# Patient Record
Sex: Male | Born: 1935 | Race: Black or African American | Hispanic: No | Marital: Married
Health system: Southern US, Community
[De-identification: ages and names within clinical notes are randomized; demographics above are authoritative.]

## PROBLEM LIST (undated history)

## (undated) DIAGNOSIS — I10 Essential (primary) hypertension: Secondary | ICD-10-CM

## (undated) DIAGNOSIS — R569 Unspecified convulsions: Secondary | ICD-10-CM

## (undated) DIAGNOSIS — E785 Hyperlipidemia, unspecified: Secondary | ICD-10-CM

## (undated) DIAGNOSIS — C7951 Secondary malignant neoplasm of bone: Secondary | ICD-10-CM

## (undated) DIAGNOSIS — I350 Nonrheumatic aortic (valve) stenosis: Secondary | ICD-10-CM

## (undated) DIAGNOSIS — H401133 Primary open-angle glaucoma, bilateral, severe stage: Secondary | ICD-10-CM

## (undated) DIAGNOSIS — I639 Cerebral infarction, unspecified: Secondary | ICD-10-CM

## (undated) DIAGNOSIS — C61 Malignant neoplasm of prostate: Secondary | ICD-10-CM

## (undated) DIAGNOSIS — Z86711 Personal history of pulmonary embolism: Secondary | ICD-10-CM

## (undated) DIAGNOSIS — I050 Rheumatic mitral stenosis: Secondary | ICD-10-CM

## (undated) DIAGNOSIS — Z8546 Personal history of malignant neoplasm of prostate: Secondary | ICD-10-CM

## (undated) DIAGNOSIS — H269 Unspecified cataract: Secondary | ICD-10-CM

## (undated) DIAGNOSIS — M1990 Unspecified osteoarthritis, unspecified site: Secondary | ICD-10-CM

## (undated) DIAGNOSIS — R918 Other nonspecific abnormal finding of lung field: Secondary | ICD-10-CM

## (undated) HISTORY — DX: Unspecified cataract: H26.9

## (undated) HISTORY — DX: Hyperlipidemia, unspecified: E78.5

## (undated) HISTORY — PX: LIPOMA EXCISION: SHX5283

## (undated) HISTORY — DX: Unspecified osteoarthritis, unspecified site: M19.90

## (undated) HISTORY — DX: Essential (primary) hypertension: I10

## (undated) HISTORY — PX: PROSTATECTOMY: SHX69

---

## 2011-02-19 ENCOUNTER — Ambulatory Visit: Payer: Self-pay | Admitting: Gastroenterology

## 2011-02-19 LAB — HM COLONOSCOPY

## 2011-07-18 ENCOUNTER — Ambulatory Visit: Payer: Self-pay | Admitting: Family Medicine

## 2012-04-02 ENCOUNTER — Ambulatory Visit: Payer: Self-pay | Admitting: Urology

## 2012-09-30 ENCOUNTER — Telehealth: Payer: Self-pay

## 2012-09-30 NOTE — Telephone Encounter (Signed)
Alcario Drought with [facility] has been given the recommendation by Dr Christella Hartigan.  They will have the CT scheduled and send the result to our office

## 2012-09-30 NOTE — Telephone Encounter (Signed)
She at least needs a CT scan of abd/pelvis with IV and PO contrast.  If they order it, great, if not then I would like to see her in the office for NGI before ordering any imaging.

## 2012-09-30 NOTE — Telephone Encounter (Signed)
Dr Christella Hartigan this is the pt referred over for EUS, you asked for a CT or Colon (flex)  I called and the nurse states that neither of these have been done.  Do you want this done prior?  I have put the records on your desk.

## 2015-04-04 DIAGNOSIS — E780 Pure hypercholesterolemia: Secondary | ICD-10-CM | POA: Diagnosis not present

## 2015-04-04 DIAGNOSIS — C61 Malignant neoplasm of prostate: Secondary | ICD-10-CM | POA: Diagnosis not present

## 2015-04-04 DIAGNOSIS — I10 Essential (primary) hypertension: Secondary | ICD-10-CM | POA: Diagnosis not present

## 2015-04-04 LAB — HEPATIC FUNCTION PANEL
ALT: 19 U/L (ref 10–40)
AST: 23 U/L (ref 14–40)
Alkaline Phosphatase: 44 U/L (ref 25–125)

## 2015-04-04 LAB — CBC AND DIFFERENTIAL
HEMATOCRIT: 40 % — AB (ref 41–53)
Hemoglobin: 13.5 g/dL (ref 13.5–17.5)
NEUTROS ABS: 3 /uL
PLATELETS: 186 10*3/uL (ref 150–399)
WBC: 6.5 10^3/mL

## 2015-04-04 LAB — LIPID PANEL
Cholesterol: 199 mg/dL (ref 0–200)
HDL: 33 mg/dL — AB (ref 35–70)
LDL CALC: 134 mg/dL
LDL/HDL RATIO: 4.1
Triglycerides: 159 mg/dL (ref 40–160)

## 2015-04-04 LAB — BASIC METABOLIC PANEL
BUN: 20 mg/dL (ref 4–21)
Creatinine: 1.2 mg/dL (ref 0.6–1.3)
GLUCOSE: 102 mg/dL
Potassium: 4.8 mmol/L (ref 3.4–5.3)
SODIUM: 140 mmol/L (ref 137–147)

## 2015-04-04 LAB — TSH: TSH: 4.76 u[IU]/mL (ref 0.41–5.90)

## 2015-04-04 LAB — HEMOGLOBIN A1C: Hgb A1c MFr Bld: 6.1 % — AB (ref 4.0–6.0)

## 2015-07-06 ENCOUNTER — Telehealth: Payer: Self-pay | Admitting: Family Medicine

## 2015-07-06 NOTE — Telephone Encounter (Signed)
Pt wife returning call.  PR#[id number]

## 2015-07-06 NOTE — Telephone Encounter (Signed)
See other message in the wife's chart-aa

## 2015-09-27 DIAGNOSIS — Z87891 Personal history of nicotine dependence: Secondary | ICD-10-CM | POA: Insufficient documentation

## 2015-09-27 DIAGNOSIS — E7800 Pure hypercholesterolemia, unspecified: Secondary | ICD-10-CM | POA: Insufficient documentation

## 2015-09-27 DIAGNOSIS — E669 Obesity, unspecified: Secondary | ICD-10-CM | POA: Insufficient documentation

## 2015-09-27 DIAGNOSIS — R739 Hyperglycemia, unspecified: Secondary | ICD-10-CM | POA: Insufficient documentation

## 2015-09-27 DIAGNOSIS — I10 Essential (primary) hypertension: Secondary | ICD-10-CM | POA: Insufficient documentation

## 2015-09-28 ENCOUNTER — Encounter: Payer: Self-pay | Admitting: Family Medicine

## 2015-09-28 ENCOUNTER — Ambulatory Visit (INDEPENDENT_AMBULATORY_CARE_PROVIDER_SITE_OTHER): Payer: Medicare Other | Admitting: Family Medicine

## 2015-09-28 VITALS — BP 168/84 | HR 88 | Temp 97.7°F | Resp 14 | Ht 68.5 in | Wt 229.0 lb

## 2015-09-28 DIAGNOSIS — R739 Hyperglycemia, unspecified: Secondary | ICD-10-CM

## 2015-09-28 DIAGNOSIS — I10 Essential (primary) hypertension: Secondary | ICD-10-CM

## 2015-09-28 DIAGNOSIS — E7800 Pure hypercholesterolemia, unspecified: Secondary | ICD-10-CM | POA: Diagnosis not present

## 2015-09-28 LAB — POCT GLYCOSYLATED HEMOGLOBIN (HGB A1C): HEMOGLOBIN A1C: 6.2

## 2015-09-28 NOTE — Progress Notes (Signed)
Patient ID: Travis Salazar., male   DOB: Aug 22, 1936, 79 y.o.   MRN: [id number]       Patient: Travis Salazar. Male    DOB: September 14, 1936   79 y.o.   MRN: [id number] Visit Date: 09/28/2015  Today's Provider: [name], MD   Chief Complaint  Patient presents with  . Hyperlipidemia  . Hypertension  . Hyperglycemia   Subjective:    Hyperlipidemia This is a chronic problem. The problem is controlled. Pertinent negatives include no chest pain, focal sensory loss, focal weakness, leg pain, myalgias or shortness of breath. Current antihyperlipidemic treatment includes statins. The current treatment provides moderate improvement of lipids. There are no compliance problems.   Hypertension This is a chronic problem. The problem is unchanged. The problem is uncontrolled. Associated symptoms include headaches. Pertinent negatives include no blurred vision, chest pain, palpitations, peripheral edema or shortness of breath. There are no compliance problems.   Hyperglycemia This is a chronic problem. The problem has been unchanged. Associated symptoms include headaches. Pertinent negatives include no chest pain or myalgias.  Last HgbA1c was 6.1 on 04/04/2015.      No Known Allergies Previous Medications   ASPIRIN 325 MG TABLET    Take by mouth.   ATORVASTATIN (LIPITOR) 10 MG TABLET    Take by mouth.   HYDROCHLOROTHIAZIDE (HYDRODIURIL) 25 MG TABLET    Take by mouth.   LISINOPRIL (PRINIVIL,ZESTRIL) 40 MG TABLET    Take by mouth.    Review of Systems  Constitutional: Negative.   Eyes: Negative.  Negative for blurred vision.  Respiratory: Negative.  Negative for shortness of breath.   Cardiovascular: Negative.  Negative for chest pain and palpitations.  Endocrine: Negative.   Musculoskeletal: Negative.  Negative for myalgias.  Neurological: Positive for headaches. Negative for focal weakness.  Psychiatric/Behavioral: Negative.     Social History  Substance Use Topics  . Smoking  status: Never Smoker   . Smokeless tobacco: Not on file  . Alcohol Use: No   Objective:   BP 168/84 mmHg  Pulse 88  Temp(Src) 97.7 F (36.5 C)  Resp 14  Ht 5' 8.5" (1.74 m)  Wt 229 lb (103.874 kg)  BMI 34.31 kg/m2  SpO2 100%  Physical Exam  Constitutional: He is oriented to person, place, and time. He appears well-developed and well-nourished.  HENT:  Head: Normocephalic and atraumatic.  Right Ear: External ear normal.  Left Ear: External ear normal.  Nose: Nose normal.  Eyes: Conjunctivae are normal.  Neck: Neck supple.  Cardiovascular: Normal rate, regular rhythm and normal heart sounds.   Pulmonary/Chest: Effort normal and breath sounds normal.  Abdominal: Soft.  Neurological: He is alert and oriented to person, place, and time.  Skin: Skin is warm and dry.  Psychiatric: He has a normal mood and affect. His behavior is normal. Judgment and thought content normal.        Assessment & Plan:     1. Blood glucose elevated  - POCT HgB A1C--6.2 today--good control. Asian continued to work on diet and exercise habits ongoing  2. Hypercholesterolemia   3. Essential (primary) hypertension        [name], MD  [facility]

## 2015-10-02 ENCOUNTER — Ambulatory Visit: Payer: Self-pay | Admitting: Family Medicine

## 2016-01-04 ENCOUNTER — Encounter: Payer: Self-pay | Admitting: Emergency Medicine

## 2016-03-28 ENCOUNTER — Ambulatory Visit: Payer: Medicare Other | Admitting: Family Medicine

## 2016-04-04 ENCOUNTER — Encounter: Payer: Self-pay | Admitting: Family Medicine

## 2016-04-04 ENCOUNTER — Ambulatory Visit (INDEPENDENT_AMBULATORY_CARE_PROVIDER_SITE_OTHER): Payer: Medicare Other | Admitting: Family Medicine

## 2016-04-04 VITALS — BP 116/80 | HR 80 | Temp 98.3°F | Resp 16 | Wt 228.0 lb

## 2016-04-04 DIAGNOSIS — I10 Essential (primary) hypertension: Secondary | ICD-10-CM

## 2016-04-04 DIAGNOSIS — E7800 Pure hypercholesterolemia, unspecified: Secondary | ICD-10-CM

## 2016-04-04 DIAGNOSIS — R739 Hyperglycemia, unspecified: Secondary | ICD-10-CM | POA: Diagnosis not present

## 2016-04-04 DIAGNOSIS — E669 Obesity, unspecified: Secondary | ICD-10-CM | POA: Diagnosis not present

## 2016-04-04 NOTE — Progress Notes (Signed)
Patient ID: Travis Salazar., male   DOB: 05-30-1936, 80 y.o.   MRN: [id number]    Subjective:  HPI  Patient is here for 6 months follow up.  Hypertension: Patient checks his b/p occasionally and readings are around 120s-80s. No cardiac symptoms. BP Readings from Last 3 Encounters:  04/04/16 116/80  09/28/15 168/84  04/04/15 142/90   Hyperlipidemia: Patient takes Lipitor daily. No side effects so far. Lab Results  Component Value Date   CHOL 199 04/04/2015   HDL 33* 04/04/2015   LDLCALC 134 04/04/2015   TRIG 159 04/04/2015    Hyperglycemia: patient is not on any medications for this. He walks about 2 to 3 times a week for about 20 minutes or so. Patient does not check his sugars. Lab Results  Component Value Date   HGBA1C 6.2 09/28/2015     Prior to Admission medications   Medication Sig Start Date End Date Taking? Authorizing Provider  aspirin 325 MG tablet Take by mouth. 12/03/11  Yes Historical Provider, MD  atorvastatin (LIPITOR) 10 MG tablet Take by mouth. 05/02/15  Yes Historical Provider, MD  hydrochlorothiazide (HYDRODIURIL) 25 MG tablet Take by mouth. 02/14/15  Yes Historical Provider, MD  lisinopril (PRINIVIL,ZESTRIL) 40 MG tablet Take by mouth. 05/02/15  Yes Historical Provider, MD    Patient Active Problem List   Diagnosis Date Noted  . Essential (primary) hypertension 09/27/2015  . History of tobacco use 09/27/2015  . Hypercholesterolemia 09/27/2015  . Blood glucose elevated 09/27/2015  . Adiposity 09/27/2015    No past medical history on file.  Social History   Social History  . Marital Status: Married    Spouse Name: N/A  . Number of Children: N/A  . Years of Education: N/A   Occupational History  . Not on file.   Social History Main Topics  . Smoking status: Never Smoker   . Smokeless tobacco: Never Used  . Alcohol Use: No  . Drug Use: No  . Sexual Activity: Not on file   Other Topics Concern  . Not on file   Social History Narrative     No Known Allergies  Review of Systems  Constitutional: Negative.   Respiratory: Negative.   Cardiovascular: Negative.   Gastrointestinal: Negative.   Musculoskeletal: Negative.   Psychiatric/Behavioral: Negative.     Immunization History  Administered Date(s) Administered  . Pneumococcal Conjugate-13 10/04/2014  . Zoster 11/13/2012   Objective:  BP 116/80 mmHg  Pulse 80  Temp(Src) 98.3 F (36.8 C)  Resp 16  Wt 228 lb (103.42 kg)  Physical Exam  Constitutional: He is oriented to person, place, and time and well-developed, well-nourished, and in no distress.  HENT:  Head: Normocephalic and atraumatic.  Eyes: Conjunctivae are normal. Pupils are equal, round, and reactive to light.  Neck: Normal range of motion. Neck supple.  Cardiovascular: Normal rate, regular rhythm, normal heart sounds and intact distal pulses.   No murmur heard. Pulmonary/Chest: Effort normal and breath sounds normal. No respiratory distress. He has no wheezes.  Abdominal: There is no tenderness.  Musculoskeletal: Normal range of motion. He exhibits no edema or tenderness.  Neurological: He is alert and oriented to person, place, and time.  Psychiatric: Mood, memory, affect and judgment normal.    Lab Results  Component Value Date   WBC 6.5 04/04/2015   HGB 13.5 04/04/2015   HCT 40* 04/04/2015   PLT 186 04/04/2015   CHOL 199 04/04/2015   TRIG 159 04/04/2015   HDL 33* 04/04/2015  LDLCALC 134 04/04/2015   TSH 4.76 04/04/2015   HGBA1C 6.2 09/28/2015    CMP     Component Value Date/Time   NA 140 04/04/2015   K 4.8 04/04/2015   BUN 20 04/04/2015   CREATININE 1.2 04/04/2015   AST 23 04/04/2015   ALT 19 04/04/2015   ALKPHOS 44 04/04/2015    Assessment and Plan :  1. Essential (primary) hypertension Stable. Continue current medications. - CBC with Differential/Platelet - Comprehensive metabolic panel  2. Hypercholesterolemia Check levels, pending results. - Comprehensive  metabolic panel - Lipid Panel With LDL/HDL Ratio  3. Blood glucose elevated/prediabetes Pending results. - HgB A1c  4. Obesity Continue working on habits. - TSH  Patient was seen and examined by Dr. Eulas Salazar and note was scribed by Travis Salazar, [place].  Travis Aschoff MD [facility] 04/04/2016 11:32 AM

## 2016-04-26 ENCOUNTER — Other Ambulatory Visit: Payer: Self-pay | Admitting: Family Medicine

## 2016-06-03 ENCOUNTER — Other Ambulatory Visit: Payer: Self-pay | Admitting: Family Medicine

## 2016-10-08 ENCOUNTER — Encounter: Payer: Self-pay | Admitting: Family Medicine

## 2016-10-08 ENCOUNTER — Ambulatory Visit (INDEPENDENT_AMBULATORY_CARE_PROVIDER_SITE_OTHER): Payer: Medicare Other | Admitting: Family Medicine

## 2016-10-08 VITALS — BP 138/86 | HR 84 | Temp 98.3°F | Resp 16 | Ht 69.0 in | Wt 220.0 lb

## 2016-10-08 DIAGNOSIS — R0683 Snoring: Secondary | ICD-10-CM | POA: Diagnosis not present

## 2016-10-08 DIAGNOSIS — Z9079 Acquired absence of other genital organ(s): Secondary | ICD-10-CM | POA: Insufficient documentation

## 2016-10-08 DIAGNOSIS — Z0000 Encounter for general adult medical examination without abnormal findings: Secondary | ICD-10-CM

## 2016-10-08 DIAGNOSIS — R739 Hyperglycemia, unspecified: Secondary | ICD-10-CM | POA: Diagnosis not present

## 2016-10-08 DIAGNOSIS — E7800 Pure hypercholesterolemia, unspecified: Secondary | ICD-10-CM

## 2016-10-08 NOTE — Progress Notes (Signed)
Patient: Travis Marczak., Male    DOB: 31-May-1936, 80 y.o.   MRN: [id number] Visit Date: 10/08/2016  Today's Provider: [name], MD   Chief Complaint  Patient presents with  . Medicare Wellness   Subjective:    Annual wellness visit Travis Huenefeld. is a 80 y.o. male. He feels well. He reports exercising 3 days per week. Walks for 2 miles. He reports he is sleeping well.  Last colonoscopy- 02/19/2011- medium sized lipoma in transverse colon. Otherwise WNL. Dr. Candace Cruise. Patient feels well. He lives at home with his wife. His phrenic son lives with him. His review of systems is negative except for snoring and mild joint stiffness. -----------------------------------------------------------   Review of Systems  Constitutional: Negative.   HENT: Negative.   Eyes: Negative.   Respiratory: Negative.   Cardiovascular: Negative.   Gastrointestinal: Negative.   Endocrine: Negative.   Genitourinary: Negative.   Musculoskeletal: Negative.   Skin: Negative.   Allergic/Immunologic: Negative.   Neurological: Negative.   Hematological: Negative.   Psychiatric/Behavioral: Negative.     Social History   Social History  . Marital status: Married    Spouse name: Travis Salazar  . Number of children: 4  . Years of education: 10th grade   Occupational History  . retired    Social History Main Topics  . Smoking status: Former Smoker    Packs/day: 1.00    Years: 50.00    Quit date: 12/15/2009  . Smokeless tobacco: Never Used  . Alcohol use No  . Drug use: No  . Sexual activity: Not on file   Other Topics Concern  . Not on file   Social History Narrative  . No narrative on file    History reviewed. No pertinent past medical history.   Patient Active Problem List   Diagnosis Date Noted  . S/P prostatectomy 10/08/2016  . Snores 10/08/2016  . Essential (primary) hypertension 09/27/2015  . History of tobacco use 09/27/2015  . Hypercholesterolemia 09/27/2015  .  Blood glucose elevated 09/27/2015  . Adiposity 09/27/2015    Past Surgical History:  Procedure Laterality Date  . LIPOMA EXCISION     neck  . PROSTATECTOMY     due to prostate cancer    His family history includes COPD in his mother; Heart attack in his father.    Current Meds  Medication Sig  . aspirin 325 MG tablet Take by mouth.  Marland Kitchen atorvastatin (LIPITOR) 10 MG tablet Take by mouth.  . hydrochlorothiazide (HYDRODIURIL) 25 MG tablet TAKE 1 TABLET BY MOUTH DAILY  . lisinopril (PRINIVIL,ZESTRIL) 40 MG tablet TAKE 1 TABLET BY MOUTH DAILY    Patient Care Team: [name], MD as PCP - General (Family Medicine)    Objective:   Vitals: BP 138/86 (BP Location: Left Arm, Patient Position: Sitting, Cuff Size: Large)   Pulse 84   Temp 98.3 F (36.8 C) (Oral)   Resp 16   Ht 5\' 9"  (1.753 m)   Wt 220 lb (99.8 kg)   BMI 32.49 kg/m   Physical Exam  Constitutional: He is oriented to person, place, and time. He appears well-developed and well-nourished.  HENT:  Head: Normocephalic and atraumatic.  Right Ear: Tympanic membrane and external ear normal.  Left Ear: Tympanic membrane and external ear normal.  Nose: Nose normal.  Mouth/Throat: Oropharynx is clear and moist.  Eyes: Conjunctivae are normal. Pupils are equal, round, and reactive to light. Right eye exhibits no discharge. Left  eye exhibits no discharge.  Neck: Normal range of motion. Neck supple. No tracheal deviation present. No thyromegaly present.  Cardiovascular: Normal rate and regular rhythm.   Murmur (soft) heard.  Systolic murmur is present with a grade of 2/6  Pulmonary/Chest: Effort normal and breath sounds normal. No respiratory distress.  Abdominal: Soft. Bowel sounds are normal. He exhibits no distension.  Musculoskeletal: Normal range of motion. He exhibits no edema.  Lymphadenopathy:    He has no cervical adenopathy.  Neurological: He is alert and oriented to person, place, and time. He has  normal reflexes.  Skin: Skin is warm and dry.  Psychiatric: He has a normal mood and affect. His behavior is normal.    Activities of Daily Living In your present state of health, do you have any difficulty performing the following activities: 10/08/2016 04/04/2016  Hearing? N N  Vision? N N  Difficulty concentrating or making decisions? N N  Walking or climbing stairs? N N  Dressing or bathing? N N  Doing errands, shopping? N N  Some recent data might be hidden    Fall Risk Assessment Fall Risk  10/08/2016 04/04/2016  Falls in the past year? No No     Depression Screen PHQ 2/9 Scores 10/08/2016 04/04/2016  PHQ - 2 Score 0 0    Cognitive Testing - 6-CIT  Correct? Score   What year is it? yes 0 0 or 4  What month is it? yes 0 0 or 3  Memorize:    Travis Salazar,  44,  [place] [address],  [place],      What time is it? (within 1 hour) yes 0 0 or 3  Count backwards from 20 yes 0 0, 2, or 4  Name the months of the year no 4 0, 2, or 4  Repeat name & address above no 6 0, 2, 4, 6, 8, or 10       TOTAL SCORE  10/28   Interpretation:  Abnormal- 10/28  Normal (0-7) Abnormal (8-28)       Assessment & Plan:     Annual Wellness Visit  Reviewed patient's Family Medical History Reviewed and updated list of patient's medical providers Assessment of cognitive impairment was done Assessed patient's functional ability Established a written schedule for health screening [place] Completed and Reviewed  Exercise Activities and Dietary recommendations Goals    None      Immunization History  Administered Date(s) Administered  . Pneumococcal Conjugate-13 10/04/2014  . Pneumococcal Polysaccharide-23 01/11/2013  . Zoster 11/13/2012    Health Maintenance  Topic Date Due  . TETANUS/TDAP  10/16/1957  . PNA vac Low Risk Adult (2 of 2 - PPSV23) 10/05/2015  . INFLUENZA VACCINE  01/02/2017 (Originally 07/16/2016)  . ZOSTAVAX  Completed      Discussed health  benefits of physical activity, and encouraged him to engage in regular exercise appropriate for his age and condition.    ------------------------------------------------------------------------------------------------------------ 1. Medicare annual wellness visit, subsequent  2. Hypercholesterolemia Check labs. FU pending results. - CBC with Differential/Platelet - Comprehensive metabolic panel - Lipid panel - TSH  3. Blood glucose elevated Check labs. FU pending results. - Hemoglobin A1c  4. S/P prostatectomy Continue to FU with Dr. Bernardo Heater as scheduled (March 2018).  DRE per Dr. Bernardo Heater. 5. Snores No apneic episodes. Will monitor. 6.HTN Controlled  Patient seen and examined by Miguel Aschoff, MD, and note scribed by Renaldo Fiddler, CMA.   Travis Salazar Mon, MD  [facility]  [place]

## 2016-10-09 ENCOUNTER — Telehealth: Payer: Self-pay

## 2016-10-09 LAB — LIPID PANEL
CHOL/HDL RATIO: 5.3 ratio — AB (ref 0.0–5.0)
Cholesterol, Total: 179 mg/dL (ref 100–199)
HDL: 34 mg/dL — ABNORMAL LOW (ref >39)
LDL Calculated: 111 mg/dL — ABNORMAL HIGH (ref 0–99)
TRIGLYCERIDES: 168 mg/dL — AB (ref 0–149)
VLDL Cholesterol Cal: 34 mg/dL (ref 5–40)

## 2016-10-09 LAB — TSH: TSH: 3.66 u[IU]/mL (ref 0.450–4.500)

## 2016-10-09 LAB — COMPREHENSIVE METABOLIC PANEL
A/G RATIO: 1.5 (ref 1.2–2.2)
ALBUMIN: 4.3 g/dL (ref 3.5–4.8)
ALK PHOS: 46 IU/L (ref 39–117)
ALT: 19 IU/L (ref 0–44)
AST: 26 IU/L (ref 0–40)
BILIRUBIN TOTAL: 0.4 mg/dL (ref 0.0–1.2)
BUN / CREAT RATIO: 18 (ref 10–24)
BUN: 20 mg/dL (ref 8–27)
CHLORIDE: 98 mmol/L (ref 96–106)
CO2: 27 mmol/L (ref 18–29)
Calcium: 10 mg/dL (ref 8.6–10.2)
Creatinine, Ser: 1.09 mg/dL (ref 0.76–1.27)
GFR calc Af Amer: 75 mL/min/{1.73_m2} (ref >59)
GFR calc non Af Amer: 65 mL/min/{1.73_m2} (ref >59)
GLUCOSE: 104 mg/dL — AB (ref 65–99)
Globulin, Total: 2.9 g/dL (ref 1.5–4.5)
POTASSIUM: 4.7 mmol/L (ref 3.5–5.2)
Sodium: 140 mmol/L (ref 134–144)
Total Protein: 7.2 g/dL (ref 6.0–8.5)

## 2016-10-09 LAB — CBC WITH DIFFERENTIAL/PLATELET
BASOS ABS: 0 10*3/uL (ref 0.0–0.2)
Basos: 0 %
EOS (ABSOLUTE): 0.3 10*3/uL (ref 0.0–0.4)
Eos: 4 %
HEMOGLOBIN: 13.5 g/dL (ref 12.6–17.7)
Hematocrit: 40.6 % (ref 37.5–51.0)
Immature Grans (Abs): 0 10*3/uL (ref 0.0–0.1)
Immature Granulocytes: 0 %
LYMPHS ABS: 2.5 10*3/uL (ref 0.7–3.1)
Lymphs: 35 %
MCH: 29.5 pg (ref 26.6–33.0)
MCHC: 33.3 g/dL (ref 31.5–35.7)
MCV: 89 fL (ref 79–97)
Monocytes Absolute: 0.7 10*3/uL (ref 0.1–0.9)
Monocytes: 10 %
NEUTROS ABS: 3.6 10*3/uL (ref 1.4–7.0)
Neutrophils: 51 %
PLATELETS: 227 10*3/uL (ref 150–379)
RBC: 4.57 x10E6/uL (ref 4.14–5.80)
RDW: 14.2 % (ref 12.3–15.4)
WBC: 7.1 10*3/uL (ref 3.4–10.8)

## 2016-10-09 LAB — HEMOGLOBIN A1C
Est. average glucose Bld gHb Est-mCnc: 128 mg/dL
HEMOGLOBIN A1C: 6.1 % — AB (ref 4.8–5.6)

## 2016-10-09 NOTE — Telephone Encounter (Signed)
Pt advised.   Thanks,   -Cary Lothrop  

## 2016-10-09 NOTE — Telephone Encounter (Signed)
-----   Message from [name], MD sent at 10/09/2016  9:05 AM EDT ----- Labs okay.

## 2016-10-29 ENCOUNTER — Other Ambulatory Visit: Payer: Self-pay | Admitting: Family Medicine

## 2016-10-30 MED ORDER — ATORVASTATIN CALCIUM 10 MG PO TABS: 10.0000 mg | ORAL_TABLET | Freq: Every day | ORAL | 3 refills | Status: DC

## 2016-10-30 NOTE — Addendum Note (Signed)
Addended by: Arnette Norris on: 10/30/2016 08:30 AM   Modules accepted: Orders

## 2017-01-17 ENCOUNTER — Telehealth: Payer: Self-pay | Admitting: Family Medicine

## 2017-01-17 NOTE — Telephone Encounter (Signed)
Called Pt to schedule AWV with [facility] - knb °

## 2017-03-31 ENCOUNTER — Other Ambulatory Visit: Payer: Self-pay | Admitting: Family Medicine

## 2017-04-07 ENCOUNTER — Encounter: Payer: Self-pay | Admitting: Family Medicine

## 2017-04-07 ENCOUNTER — Ambulatory Visit (INDEPENDENT_AMBULATORY_CARE_PROVIDER_SITE_OTHER): Payer: Medicare Other | Admitting: Family Medicine

## 2017-04-07 VITALS — BP 148/82 | HR 84 | Temp 97.8°F | Resp 16 | Wt 225.0 lb

## 2017-04-07 DIAGNOSIS — M778 Other enthesopathies, not elsewhere classified: Secondary | ICD-10-CM

## 2017-04-07 DIAGNOSIS — I10 Essential (primary) hypertension: Secondary | ICD-10-CM | POA: Diagnosis not present

## 2017-04-07 DIAGNOSIS — R739 Hyperglycemia, unspecified: Secondary | ICD-10-CM

## 2017-04-07 LAB — POCT GLYCOSYLATED HEMOGLOBIN (HGB A1C): Hemoglobin A1C: 5.9

## 2017-04-07 NOTE — Progress Notes (Signed)
Subjective:  HPI  Hypertension, follow-up:  BP Readings from Last 3 Encounters:  04/07/17 (!) 158/88  10/08/16 138/86  04/04/16 116/80    He was last seen for hypertension 6 months ago.  BP at that visit was 138/86. Management since that visit includes none. He reports good compliance with treatment. He is not having side effects.  He is exercising. He is adherent to low salt diet.   Outside blood pressures are running 140's/70's. He is experiencing none.  Patient denies chest pain, chest pressure/discomfort, claudication, dyspnea, exertional chest pressure/discomfort, fatigue, irregular heart beat, lower extremity edema, near-syncope, orthopnea, palpitations, paroxysmal nocturnal dyspnea, syncope and tachypnea.   Cardiovascular risk factors include advanced age (older than 21 for men, 41 for women), dyslipidemia, male gender and smoking/ tobacco exposure.   Wt Readings from Last 3 Encounters:  04/07/17 225 lb (102.1 kg)  10/08/16 220 lb (99.8 kg)  04/04/16 228 lb (103.4 kg)   ------------------------------------------------------------------------   Prior to Admission medications   Medication Sig Start Date End Date Taking? Authorizing Provider  aspirin 325 MG tablet Take by mouth. 12/03/11   Historical Provider, MD  atorvastatin (LIPITOR) 10 MG tablet Take 1 tablet (10 mg total) by mouth at bedtime. 10/30/16   Trinetta Alemu Maceo Pro., MD  hydrochlorothiazide (HYDRODIURIL) 25 MG tablet TAKE 1 TABLET BY MOUTH DAILY 03/31/17   [name], MD  lisinopril (PRINIVIL,ZESTRIL) 40 MG tablet TAKE 1 TABLET BY MOUTH DAILY 06/03/16   [name], MD    Patient Active Problem List   Diagnosis Date Noted  . S/P prostatectomy 10/08/2016  . Snores 10/08/2016  . Essential (primary) hypertension 09/27/2015  . History of tobacco use 09/27/2015  . Hypercholesterolemia 09/27/2015  . Blood glucose elevated 09/27/2015  . Adiposity 09/27/2015    History reviewed. No  pertinent past medical history.  Social History   Social History  . Marital status: Married    Spouse name: [name]  . Number of children: 4  . Years of education: 10th grade   Occupational History  . retired    Social History Main Topics  . Smoking status: Former Smoker    Packs/day: 1.00    Years: 50.00    Quit date: 12/15/2009  . Smokeless tobacco: Never Used  . Alcohol use No  . Drug use: No  . Sexual activity: Not on file   Other Topics Concern  . Not on file   Social History Narrative  . No narrative on file    No Known Allergies  Review of Systems  Constitutional: Negative.   HENT: Negative.   Eyes: Negative.   Respiratory: Negative.   Cardiovascular: Negative.   Gastrointestinal: Negative.   Genitourinary: Negative.   Musculoskeletal: Positive for joint pain. Neck pain: right wrist.  Skin: Negative.   Neurological: Negative.   Endo/Heme/Allergies: Negative.   Psychiatric/Behavioral: Negative.     Immunization History  Administered Date(s) Administered  . Pneumococcal Conjugate-13 10/04/2014  . Pneumococcal Polysaccharide-23 01/11/2013  . Zoster 11/13/2012    Objective:  BP (!) 158/88 (BP Location: Left Arm, Patient Position: Sitting, Cuff Size: Large)   Pulse 84   Temp 97.8 F (36.6 C) (Oral)   Resp 16   Wt 225 lb (102.1 kg)   BMI 33.23 kg/m   Physical Exam  Constitutional: He is oriented to person, place, and time and well-developed, well-nourished, and in no distress.  HENT:  Head: Normocephalic and atraumatic.  Right Ear: External ear normal.  Left Ear:  External ear normal.  Nose: Nose normal.  Eyes: Conjunctivae and EOM are normal. Pupils are equal, round, and reactive to light.  Neck: Normal range of motion. Neck supple.  Cardiovascular: Normal rate, regular rhythm, normal heart sounds and intact distal pulses.   Pulmonary/Chest: Effort normal and breath sounds normal.  Musculoskeletal: He exhibits edema (right wrist swelling and  warmth of the wrist. ).  Neurological: He is alert and oriented to person, place, and time. He has normal reflexes. Gait normal. GCS score is 15.  Skin: Skin is warm and dry.  Psychiatric: Mood, memory, affect and judgment normal.    Lab Results  Component Value Date   WBC 7.1 10/08/2016   HGB 13.5 04/04/2015   HCT 40.6 10/08/2016   PLT 227 10/08/2016   GLUCOSE 104 (H) 10/08/2016   CHOL 179 10/08/2016   TRIG 168 (H) 10/08/2016   HDL 34 (L) 10/08/2016   LDLCALC 111 (H) 10/08/2016   TSH 3.660 10/08/2016   HGBA1C 6.1 (H) 10/08/2016    CMP     Component Value Date/Time   NA 140 10/08/2016 1110   K 4.7 10/08/2016 1110   CL 98 10/08/2016 1110   CO2 27 10/08/2016 1110   GLUCOSE 104 (H) 10/08/2016 1110   BUN 20 10/08/2016 1110   CREATININE 1.09 10/08/2016 1110   CALCIUM 10.0 10/08/2016 1110   PROT 7.2 10/08/2016 1110   ALBUMIN 4.3 10/08/2016 1110   AST 26 10/08/2016 1110   ALT 19 10/08/2016 1110   ALKPHOS 46 10/08/2016 1110   BILITOT 0.4 10/08/2016 1110   GFRNONAA 65 10/08/2016 1110   GFRAA 75 10/08/2016 1110    Assessment and Plan :  1. Essential (primary) hypertension Elevated today. Will not adjust medications at this time. Pt not taking BP meds over the weekend. Pt to take meds more regularly and follow. If still elevated at next visit will add amlodipine.   2. Hyperglycemia  - POCT HgB A1C 5.9 today. Better. Pt to work on diet and exercise.   3. Tendonitis of wrist, right Injury vs gout . If not improving pt will call back and will get gout levels and x-rays.  4.OA   HPI, Exam, and A&P Transcribed under the direction and in the presence of Maleak Brazzel L. Cranford Mon, MD  Electronically Signed: Katina Dung, CMA I have done the exam and reviewed the above chart and it is accurate to the best of my knowledge. Development worker, community has been used in this note in any air is in the dictation or transcription are unintentional.  [facility] 04/07/2017 10:42 AM

## 2017-04-15 ENCOUNTER — Ambulatory Visit
Admission: RE | Admit: 2017-04-15 | Discharge: 2017-04-15 | Disposition: A | Discharge status: Home / Self Care | Payer: Medicare Other | Source: Ambulatory Visit | Attending: Family Medicine | Admitting: Family Medicine

## 2017-04-15 ENCOUNTER — Ambulatory Visit (INDEPENDENT_AMBULATORY_CARE_PROVIDER_SITE_OTHER): Payer: Medicare Other | Admitting: Family Medicine

## 2017-04-15 ENCOUNTER — Encounter: Payer: Self-pay | Admitting: Family Medicine

## 2017-04-15 VITALS — BP 144/84 | HR 80 | Temp 98.9°F | Resp 16 | Wt 219.0 lb

## 2017-04-15 DIAGNOSIS — M7989 Other specified soft tissue disorders: Secondary | ICD-10-CM | POA: Insufficient documentation

## 2017-04-15 DIAGNOSIS — M19031 Primary osteoarthritis, right wrist: Secondary | ICD-10-CM | POA: Diagnosis not present

## 2017-04-15 DIAGNOSIS — M10031 Idiopathic gout, right wrist: Secondary | ICD-10-CM | POA: Diagnosis not present

## 2017-04-15 DIAGNOSIS — M19041 Primary osteoarthritis, right hand: Secondary | ICD-10-CM | POA: Diagnosis not present

## 2017-04-15 MED ORDER — DOXYCYCLINE HYCLATE 100 MG PO TABS: 100.0000 mg | ORAL_TABLET | Freq: Two times a day (BID) | ORAL | 0 refills | Status: DC

## 2017-04-15 MED ORDER — COLCHICINE 0.6 MG PO TABS: 0.6000 mg | ORAL_TABLET | Freq: Two times a day (BID) | ORAL | 5 refills | Status: DC

## 2017-04-15 NOTE — Progress Notes (Signed)
Patient: Travis Bahar Jr. Male    DOB: 06/04/1936   81 y.o.   MRN: [id number] Visit Date: 04/15/2017  Today's Provider: Richard Gilbert Jr, MD   Chief Complaint  Patient presents with  . Hand Pain    right hand   Subjective:    HPI Patient comes in today c/o swelling in his right hand. Patient reports that he has had swelling X 1 week or more. 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 He has been using heat and ice with no relief. He still has FROM of his right arm and hand, but it is really painful.     No Known Allergies   Current Outpatient Prescriptions:  .  aspirin 325 MG tablet, Take by mouth., Disp: , Rfl:  .  atorvastatin (LIPITOR) 10 MG tablet, Take 1 tablet (10 mg total) by mouth at bedtime., Disp: 90 tablet, Rfl: 3 .  hydrochlorothiazide (HYDRODIURIL) 25 MG tablet, TAKE 1 TABLET BY MOUTH DAILY, Disp: 90 tablet, Rfl: 3 .  lisinopril (PRINIVIL,ZESTRIL) 40 MG tablet, TAKE 1 TABLET BY MOUTH DAILY, Disp: 90 tablet, Rfl: 3  Review of Systems  Constitutional: Negative for activity change, appetite change, chills, diaphoresis, fatigue, fever and unexpected weight change.  Respiratory: Negative.   Cardiovascular: Negative.   Musculoskeletal: Positive for arthralgias, joint swelling and myalgias.  Allergic/Immunologic: Negative.   Psychiatric/Behavioral: Negative.     Social History  Substance Use Topics  . Smoking status: Former Smoker    Packs/day: 1.00    Years: 50.00    Quit date: 12/15/2009  . Smokeless tobacco: Never Used  . Alcohol use No   Objective:   BP (!) 144/84 (BP Location: Left Arm, Patient Position: Sitting, Cuff Size: Large)   Pulse 80   Temp 98.9 F (37.2 C)   Resp 16   Wt 219 lb (99.3 kg)   BMI 32.34 kg/m  Vitals:   04/15/17 1547  BP: (!) 144/84  Pulse: 80  Resp: 16  Temp: 98.9 F (37.2 C)  Weight: 219 lb (99.3 kg)     Physical Exam  Constitutional: He is oriented to person,  place, and time. He appears well-developed and well-nourished.  HENT:  Head: Normocephalic and atraumatic.  Eyes: Conjunctivae are normal. No scleral icterus.  Neck: Neck supple. No thyromegaly present.  Cardiovascular: Normal rate and regular rhythm.   Pulmonary/Chest: Effort normal.  Musculoskeletal: He exhibits edema and tenderness.  Mild swelling ,warmth, and mild tenderness of dorsal right hand and wrist.  Neurological: He is alert and oriented to person, place, and time.  Skin: Skin is warm and dry.  Psychiatric: He has a normal mood and affect. His behavior is normal. Judgment and thought content normal.        Assessment & Plan:     1. Swelling of right hand   - CBC with Differential/Platelet - Uric acid - Sed Rate (ESR) - doxycycline (VIBRA-TABS) 100 MG tablet; Take 1 tablet (100 mg total) by mouth 2 (two) times daily.  Dispense: 20 tablet; Refill: 0 - DG Hand Complete Right; Future - DG Wrist Complete Right; Future - colchicine 0.6 MG tablet; Take 1 tablet (0.6 mg total) by mouth 2 (two) times daily.  Dispense: 60 tablet; Refill: 5       Richard Gilbert Jr, MD  [facility]  

## 2017-04-16 LAB — CBC WITH DIFFERENTIAL/PLATELET
BASOS: 0 %
Basophils Absolute: 0 10*3/uL (ref 0.0–0.2)
EOS (ABSOLUTE): 0.2 10*3/uL (ref 0.0–0.4)
EOS: 2 %
Hematocrit: 43 % (ref 37.5–51.0)
Hemoglobin: 14.1 g/dL (ref 13.0–17.7)
Immature Grans (Abs): 0 10*3/uL (ref 0.0–0.1)
Immature Granulocytes: 0 %
LYMPHS: 34 %
Lymphocytes Absolute: 3.1 10*3/uL (ref 0.7–3.1)
MCH: 28.1 pg (ref 26.6–33.0)
MCHC: 32.8 g/dL (ref 31.5–35.7)
MCV: 86 fL (ref 79–97)
Monocytes Absolute: 0.7 10*3/uL (ref 0.1–0.9)
Monocytes: 8 %
Neutrophils Absolute: 5.1 10*3/uL (ref 1.4–7.0)
Neutrophils: 56 %
PLATELETS: 252 10*3/uL (ref 150–379)
RBC: 5.02 x10E6/uL (ref 4.14–5.80)
RDW: 15.3 % (ref 12.3–15.4)
WBC: 9 10*3/uL (ref 3.4–10.8)

## 2017-04-16 LAB — URIC ACID: Uric Acid: 7.2 mg/dL (ref 3.7–8.6)

## 2017-04-16 LAB — SEDIMENTATION RATE: Sed Rate: 13 mm/hr (ref 0–30)

## 2017-04-17 ENCOUNTER — Ambulatory Visit (INDEPENDENT_AMBULATORY_CARE_PROVIDER_SITE_OTHER): Payer: Medicare Other | Admitting: Family Medicine

## 2017-04-17 VITALS — BP 128/74 | HR 86 | Temp 98.6°F | Resp 14 | Wt 220.0 lb

## 2017-04-17 DIAGNOSIS — I10 Essential (primary) hypertension: Secondary | ICD-10-CM

## 2017-04-17 DIAGNOSIS — M7989 Other specified soft tissue disorders: Secondary | ICD-10-CM | POA: Diagnosis not present

## 2017-04-17 DIAGNOSIS — M10441 Other secondary gout, right hand: Secondary | ICD-10-CM | POA: Diagnosis not present

## 2017-04-17 MED ORDER — ALLOPURINOL 100 MG PO TABS: 100.0000 mg | ORAL_TABLET | Freq: Every day | ORAL | 6 refills | Status: DC

## 2017-04-17 NOTE — Progress Notes (Signed)
Travis Salazar.  MRN: [id number] DOB: 03-06-1936  Subjective:  HPI  Patient is here for 2 day follow up on right hand swelling. Last office visit was on 04/15/17. At that time patient was started on Colchicine and Doxycycline. Also xrays and lab work was ordered to check for gout also. Lab work was ok. Uric acid level was 7.2. Wrist and hand xray done and showed possible gout per message.  Patient is taking medications. Patient states hand soreness is resolved, able to use that hand more and grip is improved but still swollen. Patient Active Problem List   Diagnosis Date Noted  . S/P prostatectomy 10/08/2016  . Snores 10/08/2016  . Essential (primary) hypertension 09/27/2015  . History of tobacco use 09/27/2015  . Hypercholesterolemia 09/27/2015  . Blood glucose elevated 09/27/2015  . Adiposity 09/27/2015    No past medical history on file.  Social History   Social History  . Marital status: Married    Spouse name: [name]  . Number of children: 4  . Years of education: 10th grade   Occupational History  . retired    Social History Main Topics  . Smoking status: Former Smoker    Packs/day: 1.00    Years: 50.00    Quit date: 12/15/2009  . Smokeless tobacco: Never Used  . Alcohol use No  . Drug use: No  . Sexual activity: Not on file   Other Topics Concern  . Not on file   Social History Narrative  . No narrative on file    Outpatient Encounter Prescriptions as of 04/17/2017  Medication Sig Note  . aspirin 325 MG tablet Take by mouth. 09/27/2015: Received from: [facility]  . atorvastatin (LIPITOR) 10 MG tablet Take 1 tablet (10 mg total) by mouth at bedtime.   . colchicine 0.6 MG tablet Take 1 tablet (0.6 mg total) by mouth 2 (two) times daily.   Marland Kitchen doxycycline (VIBRA-TABS) 100 MG tablet Take 1 tablet (100 mg total) by mouth 2 (two) times daily.   . hydrochlorothiazide (HYDRODIURIL) 25 MG tablet TAKE 1 TABLET BY MOUTH DAILY   . lisinopril  (PRINIVIL,ZESTRIL) 40 MG tablet TAKE 1 TABLET BY MOUTH DAILY    No facility-administered encounter medications on file as of 04/17/2017.     No Known Allergies  Review of Systems  Constitutional: Negative.   Respiratory: Negative.   Cardiovascular: Negative.   Musculoskeletal:       Hand swelling  Neurological: Negative.     Objective:  BP 128/74   Pulse 86   Temp 98.6 F (37 C)   Resp 14   Wt 220 lb (99.8 kg)   BMI 32.49 kg/m   Physical Exam  Constitutional: He is oriented to person, place, and time and well-developed, well-nourished, and in no distress.  HENT:  Head: Normocephalic and atraumatic.  Eyes: Conjunctivae are normal. Pupils are equal, round, and reactive to light.  Neck: Normal range of motion. Neck supple.  Musculoskeletal: He exhibits no tenderness.       Right wrist: He exhibits swelling.  Neurological: He is alert and oriented to person, place, and time.  Psychiatric: Mood, memory, affect and judgment normal.    Assessment and Plan :  1. Other secondary acute gout of right hand This fits with gout, gout changes were seen on the xray. Swelling is better. Patient advised to cut back on red meat. Patient advised to finish Doxycycline and continue Colchicine. Start Allopurinol 100 mg 1 tablet daily on Monday  or Tuesday (04/21/17 or 04/22/17). Follow up in 2 weeks after starting this medication and we will re check uric acid at that time. Will try to cut back on Colchicine at that time and try to wean off this. 2. Swelling of right hand Better-at least 50% better. Could have been an infection issue but this seems to fir more with gout.  3. Essential (primary) hypertension  HPI, Exam and A&P transcribed by Theressa Millard, RMA under direction and in the presence of Miguel Aschoff, MD. I have done the exam and reviewed the chart and it is accurate to the best of my knowledge. Development worker, community has been used and  any errors in dictation or transcription are  unintentional. Miguel Aschoff M.D. [facility]

## 2017-05-01 ENCOUNTER — Ambulatory Visit (INDEPENDENT_AMBULATORY_CARE_PROVIDER_SITE_OTHER): Payer: Medicare Other | Admitting: Family Medicine

## 2017-05-01 ENCOUNTER — Encounter: Payer: Self-pay | Admitting: Family Medicine

## 2017-05-01 VITALS — BP 142/86 | HR 86 | Temp 98.0°F | Resp 16 | Wt 222.0 lb

## 2017-05-01 DIAGNOSIS — M7989 Other specified soft tissue disorders: Secondary | ICD-10-CM | POA: Diagnosis not present

## 2017-05-01 DIAGNOSIS — M10031 Idiopathic gout, right wrist: Secondary | ICD-10-CM | POA: Diagnosis not present

## 2017-05-01 MED ORDER — ALLOPURINOL 300 MG PO TABS: 300.0000 mg | ORAL_TABLET | Freq: Every day | ORAL | 11 refills | Status: DC

## 2017-05-01 MED ORDER — COLCHICINE 0.6 MG PO TABS: 0.6000 mg | ORAL_TABLET | Freq: Every day | ORAL | 5 refills | Status: DC

## 2017-05-01 NOTE — Progress Notes (Signed)
Subjective:  HPI Pt is here for a 2 week follow up of gout. He reports that he is feeling better. His wrist is still swollen but the pain is not as bad. He reports that he thinks he forgets about it and does too much and makes it stay swollen. He had some confusion about his medications. But per last not I think it was clarified. He is taking the Allopurinol 100 mg daily and Colchicine 0.6 mg BID. Per last note were going to start weaning off of Colchicine. He has cut back on his red meat and sodas.   Prior to Admission medications   Medication Sig Start Date End Date Taking? Authorizing Provider  allopurinol (ZYLOPRIM) 100 MG tablet Take 1 tablet (100 mg total) by mouth daily. 04/17/17   [name], MD  aspirin 325 MG tablet Take by mouth. 12/03/11   [provider]  atorvastatin (LIPITOR) 10 MG tablet Take 1 tablet (10 mg total) by mouth at bedtime. 10/30/16   [name], MD  colchicine 0.6 MG tablet Take 1 tablet (0.6 mg total) by mouth 2 (two) times daily. 04/15/17   [name], MD  doxycycline (VIBRA-TABS) 100 MG tablet Take 1 tablet (100 mg total) by mouth 2 (two) times daily. 04/15/17   [name], MD  hydrochlorothiazide (HYDRODIURIL) 25 MG tablet TAKE 1 TABLET BY MOUTH DAILY 03/31/17   [name], MD  lisinopril (PRINIVIL,ZESTRIL) 40 MG tablet TAKE 1 TABLET BY MOUTH DAILY 06/03/16   [name], MD    Patient Active Problem List   Diagnosis Date Noted  . S/P prostatectomy 10/08/2016  . Snores 10/08/2016  . Essential (primary) hypertension 09/27/2015  . History of tobacco use 09/27/2015  . Hypercholesterolemia 09/27/2015  . Blood glucose elevated 09/27/2015  . Adiposity 09/27/2015    History reviewed. No pertinent past medical history.  Social History   Social History  . Marital status: Married    Spouse name: [name]  . Number of children: 4  . Years of education: 10th grade   Occupational  History  . retired    Social History Main Topics  . Smoking status: Former Smoker    Packs/day: 1.00    Years: 50.00    Quit date: 12/15/2009  . Smokeless tobacco: Never Used  . Alcohol use No  . Drug use: No  . Sexual activity: Not on file   Other Topics Concern  . Not on file   Social History Narrative  . No narrative on file    No Known Allergies  Review of Systems  Constitutional: Negative.   HENT: Negative.   Eyes: Negative.   Respiratory: Negative.   Cardiovascular: Negative.   Gastrointestinal: Negative.   Genitourinary: Negative.   Musculoskeletal: Positive for joint pain.  Skin: Negative.   Neurological: Negative.   Endo/Heme/Allergies: Negative.   Psychiatric/Behavioral: Negative.     Immunization History  Administered Date(s) Administered  . Pneumococcal Conjugate-13 10/04/2014  . Pneumococcal Polysaccharide-23 01/11/2013  . Zoster 11/13/2012    Objective:  BP (!) 142/86 (BP Location: Left Arm, Patient Position: Sitting, Cuff Size: Normal)   Pulse 86   Temp 98 F (36.7 C) (Oral)   Resp 16   Wt 222 lb (100.7 kg)   BMI 32.78 kg/m   Physical Exam  Constitutional: He is oriented to person, place, and time and well-developed, well-nourished, and in no distress.  Eyes: Conjunctivae and EOM are normal. Pupils  are equal, round, and reactive to light.  Neck: Neck supple.  Cardiovascular: Normal rate, regular rhythm, normal heart sounds and intact distal pulses.   Pulmonary/Chest: Effort normal and breath sounds normal.  Musculoskeletal: He exhibits edema (right wrist).  Neurological: He is alert and oriented to person, place, and time. He has normal reflexes. Gait normal. GCS score is 15.  Skin: Skin is warm and dry.  Psychiatric: Mood, memory, affect and judgment normal.    Lab Results  Component Value Date   WBC 9.0 04/15/2017   HGB 13.5 04/04/2015   HCT 43.0 04/15/2017   PLT 252 04/15/2017   GLUCOSE 104 (H) 10/08/2016   CHOL 179 10/08/2016     TRIG 168 (H) 10/08/2016   HDL 34 (L) 10/08/2016   LDLCALC 111 (H) 10/08/2016   TSH 3.660 10/08/2016   HGBA1C 5.9 04/07/2017    CMP     Component Value Date/Time   NA 140 10/08/2016 1110   K 4.7 10/08/2016 1110   CL 98 10/08/2016 1110   CO2 27 10/08/2016 1110   GLUCOSE 104 (H) 10/08/2016 1110   BUN 20 10/08/2016 1110   CREATININE 1.09 10/08/2016 1110   CALCIUM 10.0 10/08/2016 1110   PROT 7.2 10/08/2016 1110   ALBUMIN 4.3 10/08/2016 1110   AST 26 10/08/2016 1110   ALT 19 10/08/2016 1110   ALKPHOS 46 10/08/2016 1110   BILITOT 0.4 10/08/2016 1110   GFRNONAA 65 10/08/2016 1110   GFRAA 75 10/08/2016 1110    Assessment and Plan :  1. Swelling of right hand  - colchicine 0.6 MG tablet; Take 1 tablet (0.6 mg total) by mouth daily.  Dispense: 60 tablet; Refill: 5  2. Acute idiopathic gout of right wrist Cut back to once daily for colchicine and increase allopurinol to 300 mg daily. Follow up in 1 month. - allopurinol (ZYLOPRIM) 300 MG tablet; Take 1 tablet (300 mg total) by mouth daily.  Dispense: 30 tablet; Refill: 11  HPI, Exam, and A&P Transcribed under the direction and in the presence of Bralyn Espino L. Cranford Mon, MD  Electronically Signed: Katina Dung, CMA  I have done the exam and reviewed the above chart and it is accurate to the best of my knowledge. Development worker, community has been used in this note in any air is in the dictation or transcription are unintentional.  [facility] 05/01/2017 11:14 AM

## 2017-05-01 NOTE — Patient Instructions (Addendum)
Cut back on Colchicine to once daily and get increased dose of Allopurinol. You may take 2 of the Allopurinol tablets that you have already filled then follow the directions on the bottle of the new prescription.

## 2017-05-30 ENCOUNTER — Other Ambulatory Visit: Payer: Self-pay | Admitting: Family Medicine

## 2017-06-03 ENCOUNTER — Encounter: Payer: Self-pay | Admitting: Family Medicine

## 2017-06-03 ENCOUNTER — Ambulatory Visit (INDEPENDENT_AMBULATORY_CARE_PROVIDER_SITE_OTHER): Payer: Medicare Other | Admitting: Family Medicine

## 2017-06-03 VITALS — BP 164/88 | HR 84 | Temp 98.0°F | Resp 16 | Wt 218.0 lb

## 2017-06-03 DIAGNOSIS — M109 Gout, unspecified: Secondary | ICD-10-CM

## 2017-06-03 DIAGNOSIS — M25431 Effusion, right wrist: Secondary | ICD-10-CM | POA: Diagnosis not present

## 2017-06-03 DIAGNOSIS — I10 Essential (primary) hypertension: Secondary | ICD-10-CM | POA: Diagnosis not present

## 2017-06-03 MED ORDER — PREDNISONE 10 MG (48) PO TBPK: ORAL_TABLET | ORAL | 0 refills | Status: DC

## 2017-06-03 MED ORDER — AMLODIPINE BESYLATE 5 MG PO TABS: 5.0000 mg | ORAL_TABLET | Freq: Every day | ORAL | 3 refills | Status: DC

## 2017-06-03 NOTE — Progress Notes (Signed)
Patient: Travis Salazar. Male    DOB: June 29, 1936   81 y.o.   MRN: [id number] Visit Date: 06/03/2017  Today's Provider: [name], MD   Chief Complaint  Patient presents with  . Gout    FU   Subjective:    HPI     Follow up for Gout  The patient was last seen for this 4 weeks ago. Changes made at last visit include decreasing colchicine to once daily, and increasing allopurinol to 300 mg qd.  He reports excellent compliance with treatment. He feels that condition is waxing and waning. He is not having side effects.   Pt reports he still notices swelling of the right wrist when he uses the wrist. ------------------------------------------------------------------------------------    No Known Allergies   Current Outpatient Prescriptions:  .  allopurinol (ZYLOPRIM) 300 MG tablet, Take 1 tablet (300 mg total) by mouth daily., Disp: 30 tablet, Rfl: 11 .  aspirin 325 MG tablet, Take by mouth., Disp: , Rfl:  .  atorvastatin (LIPITOR) 10 MG tablet, Take 1 tablet (10 mg total) by mouth at bedtime., Disp: 90 tablet, Rfl: 3 .  colchicine 0.6 MG tablet, Take 1 tablet (0.6 mg total) by mouth daily., Disp: 60 tablet, Rfl: 5 .  lisinopril (PRINIVIL,ZESTRIL) 40 MG tablet, TAKE 1 TABLET BY MOUTH DAILY, Disp: 90 tablet, Rfl: 3 .  hydrochlorothiazide (HYDRODIURIL) 25 MG tablet, TAKE 1 TABLET BY MOUTH DAILY (Patient not taking: Reported on 06/03/2017), Disp: 90 tablet, Rfl: 3  Review of Systems  Constitutional: Negative for activity change, appetite change, chills, diaphoresis, fatigue, fever and unexpected weight change.  Eyes: Negative.   Respiratory: Negative for shortness of breath.   Cardiovascular: Negative for chest pain, palpitations and leg swelling.  Endocrine: Negative.   Musculoskeletal: Positive for joint swelling (right wrist). Negative for arthralgias.  Allergic/Immunologic: Negative.   Psychiatric/Behavioral: Negative.     Social History  Substance Use  Topics  . Smoking status: Former Smoker    Packs/day: 1.00    Years: 50.00    Quit date: 12/15/2009  . Smokeless tobacco: Never Used  . Alcohol use No   Objective:   BP (!) 164/88 (BP Location: Left Arm, Patient Position: Sitting, Cuff Size: Large)   Pulse 84   Temp 98 F (36.7 C) (Oral)   Resp 16   Wt 218 lb (98.9 kg)   BMI 32.19 kg/m  Vitals:   06/03/17 1130  BP: (!) 164/88  Pulse: 84  Resp: 16  Temp: 98 F (36.7 C)  TempSrc: Oral  Weight: 218 lb (98.9 kg)     Physical Exam  Constitutional: He appears well-developed and well-nourished.  HENT:  Head: Normocephalic and atraumatic.  Cardiovascular: Normal rate, regular rhythm and normal heart sounds.   Pulmonary/Chest: Effort normal and breath sounds normal.  Abdominal: Soft.  Musculoskeletal: He exhibits edema (right wrist). He exhibits no tenderness.  Diffuse swelling ,tenderness of right wrist with limited flexion ,extension and ROM.  Neurological: He is alert.  Skin: Skin is warm and dry.  Psychiatric: He has a normal mood and affect. His behavior is normal. Judgment and thought content normal.        Assessment & Plan:     1. Essential (primary) hypertension Not to goal. D/C HCTZ due to gout. Start amlodipine 5 mg as below. Pt warned that this could cause LE swelling. Call for problems. FU 4 weeks. - amLODipine (NORVASC) 5 MG tablet; Take 1 tablet (5 mg total)  by mouth daily.  Dispense: 90 tablet; Refill: 3  2. Gout of right wrist, unspecified cause, unspecified chronicity Not to goal. Check uric acid and start prednisone for inflammation.  - predniSONE (STERAPRED UNI-PAK 48 TAB) 10 MG (48) TBPK tablet; Taper for 12 days as prescribed.  Dispense: 48 tablet; Refill: 0 - Uric acid  3. Wrist swelling, right Question etiology. Pt reports he traumatized the wrist prior to the swelling. Treat for gout as above, but refer to hand surgeon to R/O ortho involvement. May need to refer to rheumatology if specialist  can not improve swelling. - Ambulatory referral to Orthopedic Surgery      I have done the exam and reviewed the above chart and it is accurate to the best of my knowledge. Development worker, community has been used in this note in any air is in the dictation or transcription are unintentional.  [name], MD  [place]

## 2017-06-04 DIAGNOSIS — M109 Gout, unspecified: Secondary | ICD-10-CM | POA: Diagnosis not present

## 2017-06-05 ENCOUNTER — Telehealth: Payer: Self-pay

## 2017-06-05 LAB — URIC ACID: URIC ACID: 4.9 mg/dL (ref 3.7–8.6)

## 2017-06-05 NOTE — Telephone Encounter (Signed)
-----   Message from [place] sent at 06/05/2017  9:53 AM EDT ----- [facility]  ED

## 2017-06-05 NOTE — Telephone Encounter (Signed)
Pt advised.   Thanks,   -Taji Barretto  

## 2017-07-02 ENCOUNTER — Ambulatory Visit: Payer: Medicare Other | Admitting: Family Medicine

## 2017-07-23 DIAGNOSIS — M19031 Primary osteoarthritis, right wrist: Secondary | ICD-10-CM | POA: Diagnosis not present

## 2017-07-23 DIAGNOSIS — M25531 Pain in right wrist: Secondary | ICD-10-CM | POA: Diagnosis not present

## 2017-10-06 ENCOUNTER — Encounter: Payer: Self-pay | Admitting: Family Medicine

## 2017-10-06 ENCOUNTER — Ambulatory Visit (INDEPENDENT_AMBULATORY_CARE_PROVIDER_SITE_OTHER): Payer: Medicare Other | Admitting: Family Medicine

## 2017-10-06 VITALS — BP 140/72 | HR 84 | Temp 97.5°F | Resp 14 | Wt 224.0 lb

## 2017-10-06 DIAGNOSIS — I10 Essential (primary) hypertension: Secondary | ICD-10-CM | POA: Diagnosis not present

## 2017-10-06 DIAGNOSIS — M109 Gout, unspecified: Secondary | ICD-10-CM

## 2017-10-06 DIAGNOSIS — E7800 Pure hypercholesterolemia, unspecified: Secondary | ICD-10-CM

## 2017-10-06 DIAGNOSIS — R739 Hyperglycemia, unspecified: Secondary | ICD-10-CM

## 2017-10-06 MED ORDER — AMLODIPINE BESYLATE 10 MG PO TABS: 10.0000 mg | ORAL_TABLET | Freq: Every day | ORAL | 3 refills | Status: DC

## 2017-10-06 NOTE — Progress Notes (Signed)
Patient: Travis Salazar. Male    DOB: 1936-11-29   81 y.o.   MRN: [id number] Visit Date: 10/06/2017  Today's Provider: [name], MD   Chief Complaint  Patient presents with  . Hypertension  . Gout   Subjective:    HPI  Hypertension, follow-up:  BP Readings from Last 3 Encounters:  10/06/17 140/72  06/03/17 (!) 164/88  05/01/17 (!) 142/86    He was last seen for hypertension 6 months ago.  BP at that visit was 164/88. Management since that visit includes d/c HCTZ due to gout, start amlodipine. He reports good compliance with treatment. He is not having side effects.  He is exercising. He is adherent to low salt diet.   Outside blood pressures are "elevated a little at home". Patient denies chest pain, chest pressure/discomfort, claudication, dyspnea, exertional chest pressure/discomfort, fatigue, irregular heart beat, lower extremity edema, near-syncope, orthopnea, palpitations, paroxysmal nocturnal dyspnea, syncope and tachypnea.    Wt Readings from Last 3 Encounters:  10/06/17 224 lb (101.6 kg)  06/03/17 218 lb (98.9 kg)  05/01/17 222 lb (100.7 kg)   ------------------------------------------------------------------------  Gout- Pt reports that his wrist is a lot better than it was. He can still tell its not completely right but nothing like it was before gout treatment.       No Known Allergies   Current Outpatient Prescriptions:  .  allopurinol (ZYLOPRIM) 300 MG tablet, Take 1 tablet (300 mg total) by mouth daily., Disp: 30 tablet, Rfl: 11 .  amLODipine (NORVASC) 5 MG tablet, Take 1 tablet (5 mg total) by mouth daily., Disp: 90 tablet, Rfl: 3 .  aspirin 325 MG tablet, Take by mouth., Disp: , Rfl:  .  atorvastatin (LIPITOR) 10 MG tablet, Take 1 tablet (10 mg total) by mouth at bedtime., Disp: 90 tablet, Rfl: 3 .  colchicine 0.6 MG tablet, Take 1 tablet (0.6 mg total) by mouth daily., Disp: 60 tablet, Rfl: 5 .  lisinopril (PRINIVIL,ZESTRIL) 40  MG tablet, TAKE 1 TABLET BY MOUTH DAILY, Disp: 90 tablet, Rfl: 3 .  predniSONE (STERAPRED UNI-PAK 48 TAB) 10 MG (48) TBPK tablet, Taper for 12 days as prescribed. (Patient not taking: Reported on 10/06/2017), Disp: 48 tablet, Rfl: 0  Review of Systems  Constitutional: Negative.   HENT: Negative.   Eyes: Negative.   Respiratory: Negative.   Cardiovascular: Negative.   Gastrointestinal: Negative.   Endocrine: Negative.   Genitourinary: Negative.   Musculoskeletal: Negative.   Skin: Negative.   Allergic/Immunologic: Negative.   Neurological: Negative.   Hematological: Negative.   Psychiatric/Behavioral: Negative.     Social History  Substance Use Topics  . Smoking status: Former Smoker    Packs/day: 1.00    Years: 50.00    Quit date: 12/15/2009  . Smokeless tobacco: Never Used  . Alcohol use No   Objective:   BP 140/72 (BP Location: Left Arm, Patient Position: Sitting, Cuff Size: Large)   Pulse 84   Temp (!) 97.5 F (36.4 C) (Oral)   Resp 14   Wt 224 lb (101.6 kg)   SpO2 98%   BMI 33.08 kg/m  Vitals:   10/06/17 1119  BP: 140/72  Pulse: 84  Resp: 14  Temp: (!) 97.5 F (36.4 C)  TempSrc: Oral  SpO2: 98%  Weight: 224 lb (101.6 kg)     Physical Exam  Constitutional: He is oriented to person, place, and time. He appears well-developed and well-nourished.  HENT:  Head: Normocephalic and  atraumatic.  Eyes: Pupils are equal, round, and reactive to light. Conjunctivae and EOM are normal.  Neck: Normal range of motion. Neck supple.  Cardiovascular: Normal rate, regular rhythm, normal heart sounds and intact distal pulses.   Pulmonary/Chest: Effort normal and breath sounds normal.  Musculoskeletal: Normal range of motion.  Neurological: He is alert and oriented to person, place, and time. He has normal reflexes.  Skin: Skin is warm and dry.  Psychiatric: He has a normal mood and affect. His behavior is normal. Judgment and thought content normal.          Assessment & Plan:     1. Essential (primary) hypertension Pt had been taking 10 mg Amlodipine by accident, BP better. Continue taking Amlodipine 10 mg daily.  - CBC with Differential/Platelet - TSH  2. Gout of right wrist, unspecified cause, unspecified chronicity  - Uric acid  3. Hyperglycemia  - Hemoglobin A1c  4. Hypercholesterolemia  - Lipid panel - Comprehensive metabolic panel    HPI, Exam, and A&P Transcribed under the direction and in the presence of Richard L. Cranford Mon, MD  Electronically Signed: Katina Dung, CMA I have done the exam and reviewed the above chart and it is accurate to the best of my knowledge. Development worker, community has been used in this note in any air is in the dictation or transcription are unintentional.   [name], MD  [place]

## 2017-10-07 LAB — COMPLETE METABOLIC PANEL WITH GFR
AG RATIO: 1.6 (calc) (ref 1.0–2.5)
ALBUMIN MSPROF: 4.2 g/dL (ref 3.6–5.1)
ALT: 18 U/L (ref 9–46)
AST: 36 U/L — ABNORMAL HIGH (ref 10–35)
Alkaline phosphatase (APISO): 48 U/L (ref 40–115)
BUN: 16 mg/dL (ref 7–25)
CALCIUM: 9.6 mg/dL (ref 8.6–10.3)
CO2: 29 mmol/L (ref 20–32)
Chloride: 103 mmol/L (ref 98–110)
Creat: 0.95 mg/dL (ref 0.70–1.18)
GFR, EST AFRICAN AMERICAN: 89 mL/min/{1.73_m2} (ref >=60)
GFR, EST NON AFRICAN AMERICAN: 76 mL/min/{1.73_m2} (ref >=60)
GLOBULIN: 2.7 g/dL (ref 1.9–3.7)
Glucose, Bld: 99 mg/dL (ref 65–99)
POTASSIUM: 4.2 mmol/L (ref 3.5–5.3)
SODIUM: 139 mmol/L (ref 135–146)
TOTAL PROTEIN: 6.9 g/dL (ref 6.1–8.1)
Total Bilirubin: 0.5 mg/dL (ref 0.2–1.2)

## 2017-10-07 LAB — HEMOGLOBIN A1C
EAG (MMOL/L): 7 (calc)
HEMOGLOBIN A1C: 6 %{Hb} — AB (ref <5.7)
MEAN PLASMA GLUCOSE: 126 (calc)

## 2017-10-07 LAB — CBC WITH DIFFERENTIAL/PLATELET
BASOS ABS: 38 {cells}/uL (ref 0–200)
Basophils Relative: 0.6 %
EOS ABS: 218 {cells}/uL (ref 15–500)
Eosinophils Relative: 3.4 %
HCT: 42.1 % (ref 38.5–50.0)
HEMOGLOBIN: 14 g/dL (ref 13.2–17.1)
Lymphs Abs: 2278 cells/uL (ref 850–3900)
MCH: 29.7 pg (ref 27.0–33.0)
MCHC: 33.3 g/dL (ref 32.0–36.0)
MCV: 89.2 fL (ref 80.0–100.0)
MONOS PCT: 8.3 %
MPV: 10.2 fL (ref 7.5–12.5)
Neutro Abs: 3334 cells/uL (ref 1500–7800)
Neutrophils Relative %: 52.1 %
PLATELETS: 175 10*3/uL (ref 140–400)
RBC: 4.72 10*6/uL (ref 4.20–5.80)
RDW: 13.5 % (ref 11.0–15.0)
TOTAL LYMPHOCYTE: 35.6 %
WBC mixed population: 531 cells/uL (ref 200–950)
WBC: 6.4 10*3/uL (ref 3.8–10.8)

## 2017-10-07 LAB — URIC ACID: Uric Acid, Serum: 4.5 mg/dL (ref 4.0–8.0)

## 2017-10-07 LAB — LIPID PANEL
Cholesterol: 181 mg/dL (ref <200)
HDL: 41 mg/dL (ref >40)
LDL Cholesterol (Calc): 119 mg/dL (calc) — ABNORMAL HIGH
Non-HDL Cholesterol (Calc): 140 mg/dL (calc) — ABNORMAL HIGH (ref <130)
Total CHOL/HDL Ratio: 4.4 (calc) (ref <5.0)
Triglycerides: 107 mg/dL (ref <150)

## 2017-10-07 LAB — TSH: TSH: 2.34 mIU/L (ref 0.40–4.50)

## 2017-11-03 ENCOUNTER — Other Ambulatory Visit: Payer: Self-pay | Admitting: Family Medicine

## 2017-11-03 MED ORDER — ATORVASTATIN CALCIUM 10 MG PO TABS: 10.0000 mg | ORAL_TABLET | Freq: Every day | ORAL | 3 refills | Status: DC

## 2017-11-03 NOTE — Telephone Encounter (Signed)
[facility] faxed a refill request on the following medications:  atorvastatin (LIPITOR) 10 MG tablet.   90 day supply  [facility] [place]

## 2017-11-20 ENCOUNTER — Other Ambulatory Visit: Payer: Self-pay | Admitting: Family Medicine

## 2017-11-20 MED ORDER — LISINOPRIL 40 MG PO TABS: 40.0000 mg | ORAL_TABLET | Freq: Every day | ORAL | 3 refills | Status: DC

## 2017-11-20 NOTE — Telephone Encounter (Signed)
[facility]ed a refill request for a 90-days supply for the following medication. Thanks CC  lisinopril (PRINIVIL,ZESTRIL) 40 MG tablet

## 2017-12-17 DIAGNOSIS — M47811 Spondylosis without myelopathy or radiculopathy, occipito-atlanto-axial region: Secondary | ICD-10-CM | POA: Diagnosis not present

## 2017-12-17 DIAGNOSIS — M47813 Spondylosis without myelopathy or radiculopathy, cervicothoracic region: Secondary | ICD-10-CM | POA: Diagnosis not present

## 2017-12-17 DIAGNOSIS — M2428 Disorder of ligament, vertebrae: Secondary | ICD-10-CM | POA: Diagnosis not present

## 2017-12-17 DIAGNOSIS — M47812 Spondylosis without myelopathy or radiculopathy, cervical region: Secondary | ICD-10-CM | POA: Diagnosis not present

## 2017-12-17 DIAGNOSIS — M5021 Other cervical disc displacement,  high cervical region: Secondary | ICD-10-CM | POA: Diagnosis not present

## 2018-04-07 ENCOUNTER — Encounter: Payer: Self-pay | Admitting: Family Medicine

## 2018-04-07 ENCOUNTER — Ambulatory Visit (INDEPENDENT_AMBULATORY_CARE_PROVIDER_SITE_OTHER): Payer: Medicare Other

## 2018-04-07 ENCOUNTER — Ambulatory Visit (INDEPENDENT_AMBULATORY_CARE_PROVIDER_SITE_OTHER): Payer: Medicare Other | Admitting: Family Medicine

## 2018-04-07 VITALS — BP 132/74 | HR 88 | Temp 98.7°F | Ht 69.0 in | Wt 223.6 lb

## 2018-04-07 VITALS — BP 132/74 | HR 88 | Temp 98.7°F | Ht 69.0 in | Wt 223.0 lb

## 2018-04-07 DIAGNOSIS — Z0000 Encounter for general adult medical examination without abnormal findings: Secondary | ICD-10-CM

## 2018-04-07 NOTE — Progress Notes (Signed)
Subjective:   Travis Salazar. is a 82 y.o. male who presents for Medicare Annual/Subsequent preventive examination.  Review of Systems:  N/A  Cardiac Risk Factors include: advanced age (>22men, >88 women);dyslipidemia;hypertension;male gender;obesity (BMI >30kg/m2)     Objective:    Vitals: BP 132/74 (BP Location: Left Arm)   Pulse 88   Temp 98.7 F (37.1 C) (Oral)   Ht 5\' 9"  (1.753 m)   Wt 223 lb 9.6 oz (101.4 kg)   BMI 33.02 kg/m   Body mass index is 33.02 kg/m.  Advanced Directives 04/07/2018 10/08/2016 04/04/2016  Does Patient Have a Medical Advance Directive? Yes Yes Yes  Type of Advance Directive Living will Living will;Healthcare Power of [place];Living will    Tobacco Social History   Tobacco Use  Smoking Status Former Smoker  . Packs/day: 1.00  . Years: 50.00  . Pack years: 50.00  . Last attempt to quit: 12/15/2009  . Years since quitting: 8.3  Smokeless Tobacco Never Used     Counseling given: Not Answered   Clinical Intake:  Pre-visit preparation completed: Yes  Pain : No/denies pain Pain Score: 0-No pain     Nutritional Status: BMI > 30  Obese Nutritional Risks: None Diabetes: No  How often do you need to have someone help you when you read instructions, pamphlets, or other written materials from your doctor or pharmacy?: 1 - Never  Interpreter Needed?: No  Information entered by :: [facility], LPN  Past Medical History:  Diagnosis Date  . Hyperlipidemia   . Hypertension    Past Surgical History:  Procedure Laterality Date  . LIPOMA EXCISION     neck  . PROSTATECTOMY     due to prostate cancer   Family History  Problem Relation Age of Onset  . COPD Mother   . Heart attack Father    Social History   Socioeconomic History  . Marital status: Married    Spouse name: [name]  . Number of children: 4  . Years of education: 10th grade  . Highest education level: 9th grade  Occupational History  .  Occupation: retired  Scientific laboratory technician  . Financial resource strain: Not hard at all  . Food insecurity:    Worry: Never true    Inability: Never true  . Transportation needs:    Medical: No    Non-medical: No  Tobacco Use  . Smoking status: Former Smoker    Packs/day: 1.00    Years: 50.00    Pack years: 50.00    Last attempt to quit: 12/15/2009    Years since quitting: 8.3  . Smokeless tobacco: Never Used  Substance and Sexual Activity  . Alcohol use: No    Alcohol/week: 0.0 oz  . Drug use: No  . Sexual activity: Not on file  Lifestyle  . Physical activity:    Days per week: Not on file    Minutes per session: Not on file  . Stress: Not at all  Relationships  . Social connections:    Talks on phone: Not on file    Gets together: Not on file    Attends religious service: Not on file    Active member of club or organization: Not on file    Attends meetings of clubs or organizations: Not on file    Relationship status: Not on file  Other Topics Concern  . Not on file  Social History Narrative  . Not on file    Outpatient Encounter  Medications as of 04/07/2018  Medication Sig  . allopurinol (ZYLOPRIM) 300 MG tablet Take 1 tablet (300 mg total) by mouth daily.  Marland Kitchen amLODipine (NORVASC) 10 MG tablet Take 1 tablet (10 mg total) by mouth daily.  Marland Kitchen aspirin 325 MG tablet Take 325 mg by mouth daily.   Marland Kitchen atorvastatin (LIPITOR) 10 MG tablet Take 1 tablet (10 mg total) at bedtime by mouth.  . colchicine 0.6 MG tablet Take 1 tablet (0.6 mg total) by mouth daily.  Marland Kitchen lisinopril (PRINIVIL,ZESTRIL) 40 MG tablet Take 1 tablet (40 mg total) by mouth daily.  . predniSONE (STERAPRED UNI-PAK 48 TAB) 10 MG (48) TBPK tablet Taper for 12 days as prescribed. (Patient not taking: Reported on 10/06/2017)   No facility-administered encounter medications on file as of 04/07/2018.     Activities of Daily Living In your present state of health, do you have any difficulty performing the following  activities: 04/07/2018  Hearing? N  Vision? N  Difficulty concentrating or making decisions? N  Walking or climbing stairs? N  Dressing or bathing? N  Doing errands, shopping? N  Preparing Food and eating ? N  Using the Toilet? N  In the past six months, have you accidently leaked urine? N  Do you have problems with loss of bowel control? N  Managing your Medications? N  Managing your Finances? N  Housekeeping or managing your Housekeeping? N  Some recent data might be hidden    Patient Care Team: [name], MD as PCP - General (Family Medicine) Pa, [place] as Consulting Physician (Optometry) Bernardo Heater, Ronda Fairly, MD as Consulting Physician (Urology)   Assessment:   This is a routine wellness examination for Tor[place].  Exercise Activities and Dietary recommendations Current Exercise Habits: Home exercise routine, Type of exercise: walking, Time (Minutes): 35, Frequency (Times/Week): 2(to 3 days a week), Weekly Exercise (Minutes/Week): 70, Intensity: Mild, Exercise limited by: None identified  Goals    . DIET - REDUCE SUGAR INTAKE     Recommend cutting back on daily desserts to a couple times a week.        Fall Risk Fall Risk  04/07/2018 10/08/2016 04/04/2016  Falls in the past year? No No No   Is the patient's home free of loose throw rugs in walkways, pet beds, electrical cords, etc?   yes      Grab bars in the bathroom? yes      Handrails on the stairs?   no      Adequate lighting?   yes  Timed Get Up and Go Performed: N/A  Depression Screen PHQ 2/9 Scores 04/07/2018 04/07/2018 10/08/2016 04/04/2016  PHQ - 2 Score 0 0 0 0  PHQ- 9 Score 0 - - -    Cognitive Function: Pt declined screening today.         Immunization History  Administered Date(s) Administered  . Pneumococcal Conjugate-13 10/04/2014  . Pneumococcal Polysaccharide-23 01/11/2013  . Zoster 11/13/2012    Qualifies for Shingles Vaccine? Due for Shingles vaccine. Declined my offer to  administer today. Education has been provided regarding the importance of this vaccine. Pt has been advised to call her insurance company to determine her out of pocket expense. Advised she may also receive this vaccine at her local pharmacy or Health Dept. Verbalized acceptance and understanding.  Screening Tests Health Maintenance  Topic Date Due  . TETANUS/TDAP  10/16/1957  . INFLUENZA VACCINE  07/16/2018  . PNA vac Low Risk Adult  Completed  Cancer Screenings: Lung: Low Dose CT Chest recommended if Age 47-80 years, 30 pack-year currently smoking OR have quit w/in 15years. Patient does qualify, however pt declines order.  Colorectal: Up to date  Additional Screenings:  Hepatitis C Screening: N/A      Plan:  I have personally reviewed and addressed the Medicare Annual Wellness questionnaire and have noted the following in the patient's chart:  A. Medical and social history B. Use of alcohol, tobacco or illicit drugs  C. Current medications and supplements D. Functional ability and status E.  Nutritional status F.  Physical activity G. Advance directives H. List of other physicians I.  Hospitalizations, surgeries, and ER visits in previous 12 months J.  [place] such as hearing and vision if needed, cognitive and depression L. Referrals and appointments - none  In addition, I have reviewed and discussed with patient certain preventive protocols, quality metrics, and best practice recommendations. A written personalized care plan for preventive services as well as general preventive health recommendations were provided to patient.  See attached scanned questionnaire for additional information.   Signed,  Fabio Neighbors, LPN Nurse Health Advisor   Nurse Recommendations: Pt declined the tetanus vaccine and chest CT scan today.

## 2018-04-07 NOTE — Progress Notes (Signed)
Patient: Travis Salazar., Male    DOB: March 28, 1936, 82 y.o.   MRN: [id number] Visit Date: 04/07/2018  Today's Provider: [name], MD   Chief Complaint  Patient presents with  . Annual Exam   Subjective:    Annual physical exam Travis Salazar. is a 82 y.o. male who presents today for health maintenance and complete physical. He feels well. He reports exercising, some walking. He reports he is sleeping well. Pt feels well overall.  ----------------------------------------------------------------- Colonoscopy- 02/19/11 Medium sized Lipoma otherwise normal.  Review of Systems  Constitutional: Negative.   HENT: Negative.   Eyes: Negative.   Respiratory: Negative.   Cardiovascular: Negative.   Gastrointestinal: Negative.   Endocrine: Negative.   Genitourinary: Negative.   Musculoskeletal: Negative.   Skin: Negative.   Allergic/Immunologic: Negative.   Neurological: Negative.   Hematological: Negative.   Psychiatric/Behavioral: Negative.     Social History      He  reports that he quit smoking about 8 years ago. He has a 50.00 pack-year smoking history. He has never used smokeless tobacco. He reports that he does not drink alcohol or use drugs.       Social History   Socioeconomic History  . Marital status: Married    Spouse name: [name]  . Number of children: 4  . Years of education: 10th grade  . Highest education level: 9th grade  Occupational History  . Occupation: retired  Scientific laboratory technician  . Financial resource strain: Not hard at all  . Food insecurity:    Worry: Never true    Inability: Never true  . Transportation needs:    Medical: No    Non-medical: No  Tobacco Use  . Smoking status: Former Smoker    Packs/day: 1.00    Years: 50.00    Pack years: 50.00    Last attempt to quit: 12/15/2009    Years since quitting: 8.3  . Smokeless tobacco: Never Used  Substance and Sexual Activity  . Alcohol use: No    Alcohol/week: 0.0 oz  . Drug use:  No  . Sexual activity: Not on file  Lifestyle  . Physical activity:    Days per week: Not on file    Minutes per session: Not on file  . Stress: Not at all  Relationships  . Social connections:    Talks on phone: Not on file    Gets together: Not on file    Attends religious service: Not on file    Active member of club or organization: Not on file    Attends meetings of clubs or organizations: Not on file    Relationship status: Not on file  Other Topics Concern  . Not on file  Social History Narrative  . Not on file    Past Medical History:  Diagnosis Date  . Hyperlipidemia   . Hypertension      Patient Active Problem List   Diagnosis Date Noted  . Wrist swelling, right 06/03/2017  . S/P prostatectomy 10/08/2016  . Snores 10/08/2016  . Essential (primary) hypertension 09/27/2015  . History of tobacco use 09/27/2015  . Hypercholesterolemia 09/27/2015  . Blood glucose elevated 09/27/2015  . Adiposity 09/27/2015    Past Surgical History:  Procedure Laterality Date  . LIPOMA EXCISION     neck  . PROSTATECTOMY     due to prostate cancer    Family History        Family Status  Relation Name Status  .  Mother  Deceased  . Father  Deceased at age 8  . Brother  Alive        His family history includes COPD in his mother; Heart attack in his father.      No Known Allergies   Current Outpatient Medications:  .  allopurinol (ZYLOPRIM) 300 MG tablet, Take 1 tablet (300 mg total) by mouth daily., Disp: 30 tablet, Rfl: 11 .  amLODipine (NORVASC) 10 MG tablet, Take 1 tablet (10 mg total) by mouth daily., Disp: 90 tablet, Rfl: 3 .  aspirin 325 MG tablet, Take 325 mg by mouth daily. , Disp: , Rfl:  .  atorvastatin (LIPITOR) 10 MG tablet, Take 1 tablet (10 mg total) at bedtime by mouth., Disp: 90 tablet, Rfl: 3 .  colchicine 0.6 MG tablet, Take 1 tablet (0.6 mg total) by mouth daily., Disp: 60 tablet, Rfl: 5 .  lisinopril (PRINIVIL,ZESTRIL) 40 MG tablet, Take 1  tablet (40 mg total) by mouth daily., Disp: 90 tablet, Rfl: 3 .  predniSONE (STERAPRED UNI-PAK 48 TAB) 10 MG (48) TBPK tablet, Taper for 12 days as prescribed. (Patient not taking: Reported on 10/06/2017), Disp: 48 tablet, Rfl: 0   Patient Care Team: [name], MD as PCP - General (Family Medicine) [place], [place] as Consulting Physician (Optometry) Bernardo Heater, Ronda Fairly, MD as Consulting Physician (Urology)      Objective:   Vitals: BP 132/74   Pulse 88   Temp 98.7 F (37.1 C)   Ht 5\' 9"  (1.753 m)   Wt 223 lb (101.2 kg)   BMI 32.93 kg/m    Vitals:   04/07/18 0934  BP: 132/74  Pulse: 88  Temp: 98.7 F (37.1 C)  Weight: 223 lb (101.2 kg)  Height: 5\' 9"  (1.753 m)     Physical Exam  Constitutional: He is oriented to person, place, and time. He appears well-developed and well-nourished.  HENT:  Head: Normocephalic and atraumatic.  Right Ear: External ear normal.  Left Ear: External ear normal.  Nose: Nose normal.  Mouth/Throat: Oropharynx is clear and moist.  Eyes: Pupils are equal, round, and reactive to light. No scleral icterus.  Neck: No thyromegaly present.  Cardiovascular: Normal rate, regular rhythm, normal heart sounds and intact distal pulses.  Pulmonary/Chest: Effort normal and breath sounds normal.  Abdominal: Soft.  Lymphadenopathy:    He has no cervical adenopathy.  Neurological: He is alert and oriented to person, place, and time.  Skin: Skin is warm and dry.  Psychiatric: He has a normal mood and affect. His behavior is normal. Judgment and thought content normal.     Depression Screen PHQ 2/9 Scores 04/07/2018 04/07/2018 10/08/2016 04/04/2016  PHQ - 2 Score 0 0 0 0  PHQ- 9 Score 0 - - -      Assessment & Plan:     Routine Health Maintenance and Physical Exam  Exercise Activities and Dietary recommendations Goals    . DIET - REDUCE SUGAR INTAKE     Recommend cutting back on daily desserts to a couple times a week.         Immunization History  Administered Date(s) Administered  . Pneumococcal Conjugate-13 10/04/2014  . Pneumococcal Polysaccharide-23 01/11/2013  . Zoster 11/13/2012    Health Maintenance  Topic Date Due  . TETANUS/TDAP  10/16/1957  . INFLUENZA VACCINE  07/16/2018  . PNA vac Low Risk Adult  Completed   [place] is 3. Pt declines DRE or further colon screening. Discussed health benefits of  physical activity, and encouraged him to engage in regular exercise appropriate for his age and condition.  RTC 6 months.   --------------------------------------------------------------------   I have done the exam and reviewed the above chart and it is accurate to the best of my knowledge. Development worker, community has been used in this note in any air is in the dictation or transcription are unintentional.  [name], MD  [place]

## 2018-04-07 NOTE — Patient Instructions (Signed)
[name] , Thank you for taking time to come for your Medicare Wellness Visit. I appreciate your ongoing commitment to your health goals. Please review the following plan we discussed and let me know if I can assist you in the future.   Screening recommendations/referrals: Colonoscopy: Up to date Recommended yearly ophthalmology/optometry visit for glaucoma screening and checkup Recommended yearly dental visit for hygiene and checkup  Vaccinations: Influenza vaccine: N/A Pneumococcal vaccine: Up to date Tdap vaccine: Pt declines today.  Shingles vaccine: Pt declines today.     Advanced directives: Please bring a copy of your POA (Power of Attorney) and/or Living Will to your next appointment.   Conditions/risks identified: Obesity- recommend cutting back on daily desserts to a couple times a week.   Next appointment: 9:40 AM today with Dr Rosanna Randy  Preventive Care 80 Years and Older, Male Preventive care refers to lifestyle choices and visits with your health care provider that can promote health and wellness. What does preventive care include?  A yearly physical exam. This is also called an annual well check.  Dental exams once or twice a year.  Routine eye exams. Ask your health care provider how often you should have your eyes checked.  Personal lifestyle choices, including:  Daily care of your teeth and gums.  Regular physical activity.  Eating a healthy diet.  Avoiding tobacco and drug use.  Limiting alcohol use.  Practicing safe sex.  Taking low doses of aspirin every day.  Taking vitamin and mineral supplements as recommended by your health care provider. What happens during an annual well check? The services and screenings done by your health care provider during your annual well check will depend on your age, overall health, lifestyle risk factors, and family history of disease. Counseling  Your health care provider may ask you questions about  your:  Alcohol use.  Tobacco use.  Drug use.  Emotional well-being.  Home and relationship well-being.  Sexual activity.  Eating habits.  History of falls.  Memory and ability to understand (cognition).  Work and work Statistician. Screening  You may have the following tests or measurements:  Height, weight, and BMI.  Blood pressure.  Lipid and cholesterol levels. These may be checked every 5 years, or more frequently if you are over 18 years old.  Skin check.  Lung cancer screening. You may have this screening every year starting at age 65 if you have a 30-pack-year history of smoking and currently smoke or have quit within the past 15 years.  Fecal occult blood test (FOBT) of the stool. You may have this test every year starting at age 17.  Flexible sigmoidoscopy or colonoscopy. You may have a sigmoidoscopy every 5 years or a colonoscopy every 10 years starting at age 55.  Prostate cancer screening. Recommendations will vary depending on your family history and other risks.  Hepatitis C blood test.  Hepatitis B blood test.  Sexually transmitted disease (STD) testing.  Diabetes screening. This is done by checking your blood sugar (glucose) after you have not eaten for a while (fasting). You may have this done every 1-3 years.  Abdominal aortic aneurysm (AAA) screening. You may need this if you are a current or former smoker.  Osteoporosis. You may be screened starting at age 24 if you are at high risk. Talk with your health care provider about your test results, treatment options, and if necessary, the need for more tests. Vaccines  Your health care provider may recommend certain vaccines, such  as:  Influenza vaccine. This is recommended every year.  Tetanus, diphtheria, and acellular pertussis (Tdap, Td) vaccine. You may need a Td booster every 10 years.  Zoster vaccine. You may need this after age 58.  Pneumococcal 13-valent conjugate (PCV13) vaccine.  One dose is recommended after age 48.  Pneumococcal polysaccharide (PPSV23) vaccine. One dose is recommended after age 52. Talk to your health care provider about which screenings and vaccines you need and how often you need them. This information is not intended to replace advice given to you by your health care provider. Make sure you discuss any questions you have with your health care provider. Document Released: 12/29/2015 Document Revised: 08/21/2016 Document Reviewed: 10/03/2015 Elsevier Interactive Patient Education  2017 [place] Prevention in the Home Falls can cause injuries. They can happen to people of all ages. There are many things you can do to make your home safe and to help prevent falls. What can I do on the outside of my home?  Regularly fix the edges of walkways and driveways and fix any cracks.  Remove anything that might make you trip as you walk through a door, such as a raised step or threshold.  Trim any bushes or trees on the path to your home.  Use bright outdoor lighting.  Clear any walking paths of anything that might make someone trip, such as rocks or tools.  Regularly check to see if handrails are loose or broken. Make sure that both sides of any steps have handrails.  Any raised decks and porches should have guardrails on the edges.  Have any leaves, snow, or ice cleared regularly.  Use sand or salt on walking paths during winter.  Clean up any spills in your garage right away. This includes oil or grease spills. What can I do in the bathroom?  Use night lights.  Install grab bars by the toilet and in the tub and shower. Do not use towel bars as grab bars.  Use non-skid mats or decals in the tub or shower.  If you need to sit down in the shower, use a plastic, non-slip stool.  Keep the floor dry. Clean up any water that spills on the floor as soon as it happens.  Remove soap buildup in the tub or shower regularly.  Attach bath  mats securely with double-sided non-slip rug tape.  Do not have throw rugs and other things on the floor that can make you trip. What can I do in the bedroom?  Use night lights.  Make sure that you have a light by your bed that is easy to reach.  Do not use any sheets or blankets that are too big for your bed. They should not hang down onto the floor.  Have a firm chair that has side arms. You can use this for support while you get dressed.  Do not have throw rugs and other things on the floor that can make you trip. What can I do in the kitchen?  Clean up any spills right away.  Avoid walking on wet floors.  Keep items that you use a lot in easy-to-reach places.  If you need to reach something above you, use a strong step stool that has a grab bar.  Keep electrical cords out of the way.  Do not use floor polish or wax that makes floors slippery. If you must use wax, use non-skid floor wax.  Do not have throw rugs and other things on the  floor that can make you trip. What can I do with my stairs?  Do not leave any items on the stairs.  Make sure that there are handrails on both sides of the stairs and use them. Fix handrails that are broken or loose. Make sure that handrails are as long as the stairways.  Check any carpeting to make sure that it is firmly attached to the stairs. Fix any carpet that is loose or worn.  Avoid having throw rugs at the top or bottom of the stairs. If you do have throw rugs, attach them to the floor with carpet tape.  Make sure that you have a light switch at the top of the stairs and the bottom of the stairs. If you do not have them, ask someone to add them for you. What else can I do to help prevent falls?  Wear shoes that:  Do not have high heels.  Have rubber bottoms.  Are comfortable and fit you well.  Are closed at the toe. Do not wear sandals.  If you use a stepladder:  Make sure that it is fully opened. Do not climb a closed  stepladder.  Make sure that both sides of the stepladder are locked into place.  Ask someone to hold it for you, if possible.  Clearly mark and make sure that you can see:  Any grab bars or handrails.  First and last steps.  Where the edge of each step is.  Use tools that help you move around (mobility aids) if they are needed. These include:  Canes.  Walkers.  Scooters.  Crutches.  Turn on the lights when you go into a dark area. Replace any light bulbs as soon as they burn out.  Set up your furniture so you have a clear path. Avoid moving your furniture around.  If any of your floors are uneven, fix them.  If there are any pets around you, be aware of where they are.  Review your medicines with your doctor. Some medicines can make you feel dizzy. This can increase your chance of falling. Ask your doctor what other things that you can do to help prevent falls. This information is not intended to replace advice given to you by your health care provider. Make sure you discuss any questions you have with your health care provider. Document Released: 09/28/2009 Document Revised: 05/09/2016 Document Reviewed: 01/06/2015 Elsevier Interactive Patient Education  2017 [employer].

## 2018-06-10 ENCOUNTER — Other Ambulatory Visit: Payer: Self-pay | Admitting: Family Medicine

## 2018-06-10 DIAGNOSIS — M10031 Idiopathic gout, right wrist: Secondary | ICD-10-CM

## 2018-09-28 DIAGNOSIS — H401133 Primary open-angle glaucoma, bilateral, severe stage: Secondary | ICD-10-CM | POA: Diagnosis not present

## 2018-10-07 ENCOUNTER — Ambulatory Visit (INDEPENDENT_AMBULATORY_CARE_PROVIDER_SITE_OTHER): Payer: Medicare Other | Admitting: Family Medicine

## 2018-10-07 ENCOUNTER — Encounter: Payer: Self-pay | Admitting: Family Medicine

## 2018-10-07 VITALS — BP 142/74 | HR 84 | Temp 98.5°F | Resp 16 | Wt 225.8 lb

## 2018-10-07 DIAGNOSIS — E7800 Pure hypercholesterolemia, unspecified: Secondary | ICD-10-CM

## 2018-10-07 DIAGNOSIS — R739 Hyperglycemia, unspecified: Secondary | ICD-10-CM | POA: Diagnosis not present

## 2018-10-07 DIAGNOSIS — I10 Essential (primary) hypertension: Secondary | ICD-10-CM

## 2018-10-07 NOTE — Progress Notes (Signed)
Patient: Travis Salazar. Male    DOB: Jul 22, 1936   82 y.o.   MRN: [id number] Visit Date: 10/07/2018  Today's Provider: [name], MD   Chief Complaint  Patient presents with  . Hyperglycemia  . Hypertension  . Hyperlipidemia   Subjective:    HPI      Hyperglycemia, Follow-up:   Lab Results  Component Value Date   HGBA1C 6.0 (H) 10/06/2017   HGBA1C 5.9 04/07/2017   HGBA1C 6.1 (H) 10/08/2016   Last seen for hyperglycemia 1 years ago.  Management since then includes none  Home blood sugar records: 140-182 Most Recent Eye Exam: scheduled for 10/12/18 Weight trend: stable Prior visit with dietician: no Current diet: in general, a "healthy" diet   Current exercise: walking  ------------------------------------------------------------------------   Hypertension, follow-up:  BP Readings from Last 3 Encounters:  10/07/18 (!) 142/74  04/07/18 132/74  04/07/18 132/74    He was last seen for hypertension 1 years ago.  BP at that visit was 140/72. Management since that visit includes .He reports excellent compliance with treatment. He is not having side effects.  He is exercising. He is not adherent to low salt diet.   Outside blood pressures are being checked sparingly and patient reports within normal range. He is experiencing none.  Patient denies chest pain, chest pressure/discomfort, claudication, dyspnea, exertional chest pressure/discomfort, fatigue, irregular heart beat, lower extremity edema, near-syncope, orthopnea, palpitations, paroxysmal nocturnal dyspnea, syncope and tachypnea.   Cardiovascular risk factors include advanced age (older than 9 for men, 61 for women), hypertension and male gender.  Use of agents associated with hypertension: NSAIDS.   ------------------------------------------------------------------------    Lipid/Cholesterol, Follow-up:   Last seen for this 1 years ago.  Management since that visit includes  none.  Last Lipid Panel:    Component Value Date/Time   CHOL 181 10/06/2017 1208   CHOL 179 10/08/2016 1110   TRIG 107 10/06/2017 1208   HDL 41 10/06/2017 1208   HDL 34 (L) 10/08/2016 1110   CHOLHDL 4.4 10/06/2017 1208   LDLCALC 119 (H) 10/06/2017 1208    He reports excellent compliance with treatment. He is not having side effects.   Wt Readings from Last 3 Encounters:  10/07/18 225 lb 12.8 oz (102.4 kg)  04/07/18 223 lb (101.2 kg)  04/07/18 223 lb 9.6 oz (101.4 kg)    ------------------------------------------------------------------------   No Known Allergies   Current Outpatient Medications:  .  allopurinol (ZYLOPRIM) 300 MG tablet, TAKE 1 TABLET BY MOUTH EVERY DAY, Disp: 90 tablet, Rfl: 3 .  amLODipine (NORVASC) 10 MG tablet, Take 1 tablet (10 mg total) by mouth daily., Disp: 90 tablet, Rfl: 3 .  aspirin 325 MG tablet, Take 325 mg by mouth daily. , Disp: , Rfl:  .  atorvastatin (LIPITOR) 10 MG tablet, Take 1 tablet (10 mg total) at bedtime by mouth., Disp: 90 tablet, Rfl: 3 .  colchicine 0.6 MG tablet, Take 1 tablet (0.6 mg total) by mouth daily., Disp: 60 tablet, Rfl: 5 .  lisinopril (PRINIVIL,ZESTRIL) 40 MG tablet, Take 1 tablet (40 mg total) by mouth daily., Disp: 90 tablet, Rfl: 3 .  predniSONE (STERAPRED UNI-PAK 48 TAB) 10 MG (48) TBPK tablet, Taper for 12 days as prescribed., Disp: 48 tablet, Rfl: 0  Review of Systems  Constitutional: Negative.   Eyes: Negative.   Respiratory: Negative.   Cardiovascular: Negative.   Gastrointestinal: Negative.   Musculoskeletal: Positive for arthralgias.  Allergic/Immunologic: Negative.  Neurological: Negative.   Hematological: Negative.   Psychiatric/Behavioral: Negative.     Social History   Tobacco Use  . Smoking status: Former Smoker    Packs/day: 1.00    Years: 50.00    Pack years: 50.00    Last attempt to quit: 12/15/2009    Years since quitting: 8.8  . Smokeless tobacco: Never Used  Substance Use Topics   . Alcohol use: No    Alcohol/week: 0.0 standard drinks   Objective:   BP (!) 142/74   Pulse 84   Temp 98.5 F (36.9 C) (Oral)   Resp 16   Wt 225 lb 12.8 oz (102.4 kg)   SpO2 97%   BMI 33.34 kg/m  Vitals:   10/07/18 1035  BP: (!) 142/74  Pulse: 84  Resp: 16  Temp: 98.5 F (36.9 C)  TempSrc: Oral  SpO2: 97%  Weight: 225 lb 12.8 oz (102.4 kg)     Physical Exam  Constitutional: He is oriented to person, place, and time. He appears well-developed and well-nourished.  HENT:  Head: Normocephalic and atraumatic.  Eyes: Conjunctivae are normal. No scleral icterus.  Neck: No thyromegaly present.  Cardiovascular: Normal rate, regular rhythm and normal heart sounds.  Pulmonary/Chest: Effort normal and breath sounds normal.  Abdominal: Soft.  Musculoskeletal: He exhibits no edema.  Neurological: He is alert and oriented to person, place, and time.  Skin: Skin is warm and dry.  Psychiatric: He has a normal mood and affect. His behavior is normal. Judgment and thought content normal.        Assessment & Plan:     1. Hyperglycemia  - HgB A1c  2. Hypercholesterolemia  - Lipid panel  3. Essential (primary) hypertension  - CBC with Differential/Platelet - Comprehensive metabolic panel - TSH      Patient seen and examined by Dr. Miguel Aschoff, note scribed by Jennings Books, NCMA I have done the exam and reviewed the above chart and it is accurate to the best of my knowledge. Development worker, community has been used in this note in any air is in the dictation or transcription are unintentional.  [name], MD  [place]

## 2018-10-08 LAB — COMPREHENSIVE METABOLIC PANEL
ALT: 24 IU/L (ref 0–44)
AST: 30 IU/L (ref 0–40)
Albumin/Globulin Ratio: 1.8 (ref 1.2–2.2)
Albumin: 4.6 g/dL (ref 3.5–4.8)
Alkaline Phosphatase: 50 IU/L (ref 39–117)
BILIRUBIN TOTAL: 0.3 mg/dL (ref 0.0–1.2)
BUN/Creatinine Ratio: 19 (ref 10–24)
BUN: 23 mg/dL (ref 8–27)
CALCIUM: 9.9 mg/dL (ref 8.6–10.2)
CHLORIDE: 101 mmol/L (ref 96–106)
CO2: 22 mmol/L (ref 20–29)
CREATININE: 1.18 mg/dL (ref 0.76–1.27)
GFR calc Af Amer: 67 mL/min/{1.73_m2} (ref >59)
GFR calc non Af Amer: 58 mL/min/{1.73_m2} — ABNORMAL LOW (ref >59)
GLUCOSE: 105 mg/dL — AB (ref 65–99)
Globulin, Total: 2.5 g/dL (ref 1.5–4.5)
Potassium: 4.7 mmol/L (ref 3.5–5.2)
Sodium: 138 mmol/L (ref 134–144)
Total Protein: 7.1 g/dL (ref 6.0–8.5)

## 2018-10-08 LAB — CBC WITH DIFFERENTIAL/PLATELET
BASOS ABS: 0.1 10*3/uL (ref 0.0–0.2)
Basos: 1 %
EOS (ABSOLUTE): 0.2 10*3/uL (ref 0.0–0.4)
Eos: 4 %
Hematocrit: 41.6 % (ref 37.5–51.0)
Hemoglobin: 13.7 g/dL (ref 13.0–17.7)
IMMATURE GRANS (ABS): 0 10*3/uL (ref 0.0–0.1)
IMMATURE GRANULOCYTES: 0 %
LYMPHS: 42 %
Lymphocytes Absolute: 2.7 10*3/uL (ref 0.7–3.1)
MCH: 28.6 pg (ref 26.6–33.0)
MCHC: 32.9 g/dL (ref 31.5–35.7)
MCV: 87 fL (ref 79–97)
MONOCYTES: 9 %
Monocytes Absolute: 0.6 10*3/uL (ref 0.1–0.9)
NEUTROS PCT: 44 %
Neutrophils Absolute: 2.8 10*3/uL (ref 1.4–7.0)
PLATELETS: 195 10*3/uL (ref 150–450)
RBC: 4.79 x10E6/uL (ref 4.14–5.80)
RDW: 13.6 % (ref 12.3–15.4)
WBC: 6.5 10*3/uL (ref 3.4–10.8)

## 2018-10-08 LAB — LIPID PANEL
Chol/HDL Ratio: 4.5 ratio (ref 0.0–5.0)
Cholesterol, Total: 184 mg/dL (ref 100–199)
HDL: 41 mg/dL (ref >39)
LDL CALC: 120 mg/dL — AB (ref 0–99)
Triglycerides: 114 mg/dL (ref 0–149)
VLDL CHOLESTEROL CAL: 23 mg/dL (ref 5–40)

## 2018-10-08 LAB — TSH: TSH: 3.61 u[IU]/mL (ref 0.450–4.500)

## 2018-10-08 LAB — HEMOGLOBIN A1C
Est. average glucose Bld gHb Est-mCnc: 134 mg/dL
HEMOGLOBIN A1C: 6.3 % — AB (ref 4.8–5.6)

## 2018-10-12 DIAGNOSIS — H401133 Primary open-angle glaucoma, bilateral, severe stage: Secondary | ICD-10-CM | POA: Diagnosis not present

## 2018-10-13 ENCOUNTER — Other Ambulatory Visit: Payer: Self-pay | Admitting: Family Medicine

## 2018-10-13 DIAGNOSIS — I10 Essential (primary) hypertension: Secondary | ICD-10-CM

## 2018-10-19 DIAGNOSIS — H401133 Primary open-angle glaucoma, bilateral, severe stage: Secondary | ICD-10-CM | POA: Diagnosis not present

## 2018-11-05 ENCOUNTER — Other Ambulatory Visit: Payer: Self-pay | Admitting: Family Medicine

## 2018-11-05 MED ORDER — ATORVASTATIN CALCIUM 10 MG PO TABS: 10.0000 mg | ORAL_TABLET | Freq: Every day | ORAL | 3 refills | Status: DC

## 2018-11-05 NOTE — Addendum Note (Signed)
Addended by: Althea Charon D on: 11/05/2018 01:56 PM   Modules accepted: Orders

## 2018-11-11 ENCOUNTER — Ambulatory Visit (INDEPENDENT_AMBULATORY_CARE_PROVIDER_SITE_OTHER): Payer: Medicare Other | Admitting: Family Medicine

## 2018-11-11 ENCOUNTER — Encounter: Payer: Self-pay | Admitting: Family Medicine

## 2018-11-11 VITALS — BP 124/72 | HR 84 | Temp 98.5°F | Resp 16 | Ht 69.0 in | Wt 226.0 lb

## 2018-11-11 DIAGNOSIS — I10 Essential (primary) hypertension: Secondary | ICD-10-CM

## 2018-11-11 DIAGNOSIS — E7800 Pure hypercholesterolemia, unspecified: Secondary | ICD-10-CM | POA: Diagnosis not present

## 2018-11-11 DIAGNOSIS — N179 Acute kidney failure, unspecified: Secondary | ICD-10-CM | POA: Diagnosis not present

## 2018-11-11 NOTE — Progress Notes (Signed)
Patient: Travis Salazar. Male    DOB: 01-09-1936   82 y.o.   MRN: [id number] Visit Date: 11/11/2018  Today's Provider: [name], MD   Chief Complaint  Patient presents with  . Follow-up   Subjective:    HPI Patient comes in today for a follow up. He was last seen in the office 1 month ago. He feels well today with no complaints. Labs on 10/07/18 were stable. However, there were a slight decline in kidney function. Patient was advised to push fluids and avoid NSAIDs.     No Known Allergies   Current Outpatient Medications:  .  allopurinol (ZYLOPRIM) 300 MG tablet, TAKE 1 TABLET BY MOUTH EVERY DAY, Disp: 90 tablet, Rfl: 3 .  amLODipine (NORVASC) 10 MG tablet, TAKE 1 TABLET BY MOUTH EVERY DAY, Disp: 90 tablet, Rfl: 3 .  aspirin 325 MG tablet, Take 325 mg by mouth daily. , Disp: , Rfl:  .  atorvastatin (LIPITOR) 10 MG tablet, Take 1 tablet (10 mg total) by mouth at bedtime., Disp: 90 tablet, Rfl: 3 .  colchicine 0.6 MG tablet, Take 1 tablet (0.6 mg total) by mouth daily., Disp: 60 tablet, Rfl: 5 .  lisinopril (PRINIVIL,ZESTRIL) 40 MG tablet, Take 1 tablet (40 mg total) by mouth daily., Disp: 90 tablet, Rfl: 3 .  predniSONE (STERAPRED UNI-PAK 48 TAB) 10 MG (48) TBPK tablet, Taper for 12 days as prescribed. (Patient not taking: Reported on 11/11/2018), Disp: 48 tablet, Rfl: 0  Review of Systems  Constitutional: Negative.   Eyes: Negative.   Respiratory: Negative.   Cardiovascular: Negative.  Negative for chest pain.  Gastrointestinal: Negative.   Endocrine: Negative.   Musculoskeletal: Negative.   Allergic/Immunologic: Negative.   Neurological: Negative.   Psychiatric/Behavioral: Negative.     Social History   Tobacco Use  . Smoking status: Former Smoker    Packs/day: 1.00    Years: 50.00    Pack years: 50.00    Last attempt to quit: 12/15/2009    Years since quitting: 8.9  . Smokeless tobacco: Never Used  Substance Use Topics  . Alcohol use: No   Alcohol/week: 0.0 standard drinks   Objective:   BP 124/72 (BP Location: Left Arm, Patient Position: Sitting, Cuff Size: Large)   Pulse 84   Temp 98.5 F (36.9 C)   Resp 16   Ht 5\' 9"  (1.753 m)   Wt 226 lb (102.5 kg)   SpO2 100%   BMI 33.37 kg/m  Vitals:   11/11/18 1136  BP: 124/72  Pulse: 84  Resp: 16  Temp: 98.5 F (36.9 C)  SpO2: 100%  Weight: 226 lb (102.5 kg)  Height: 5\' 9"  (1.753 m)     Physical Exam  Constitutional: He is oriented to person, place, and time. He appears well-developed and well-nourished.  HENT:  Head: Normocephalic and atraumatic.  Right Ear: External ear normal.  Left Ear: External ear normal.  Nose: Nose normal.  Eyes: Conjunctivae are normal. No scleral icterus.  Neck: No thyromegaly present.  Cardiovascular: Normal rate, regular rhythm and normal heart sounds.  Pulmonary/Chest: Effort normal and breath sounds normal.  Abdominal: Soft.  Musculoskeletal: He exhibits no edema.  Neurological: He is alert and oriented to person, place, and time.  Skin: Skin is warm and dry.  Psychiatric: He has a normal mood and affect. His behavior is normal. Judgment and thought content normal.        Assessment & Plan:  1. Essential (primary) hypertension Controlled.  2. Acute kidney injury ([place]) Recheck labs--renal risk factors treated.  3. Hypercholesterolemia       I have done the exam and reviewed the above chart and it is accurate to the best of my knowledge. Development worker, community has been used in this note in any air is in the dictation or transcription are unintentional.  [name], MD  [place]

## 2018-11-25 ENCOUNTER — Ambulatory Visit: Payer: Self-pay

## 2018-11-25 DIAGNOSIS — I10 Essential (primary) hypertension: Secondary | ICD-10-CM

## 2018-11-25 DIAGNOSIS — E7800 Pure hypercholesterolemia, unspecified: Secondary | ICD-10-CM

## 2018-11-25 NOTE — Patient Instructions (Addendum)
1. Thank you for allowing the CCM Team to assist you with your health care goals!! It was a pleasure meeting you today. 2. Please follow the low sodium diet recommendations discussed today for HTN control. 3. Please contact your CCM Team with questions, concerns, or needs.  CCM (Chronic Care Management) Team   Portia Payne RN, BSN Nurse Care Coordinator  [phone]  Julie Hedrick PharmD  Clinical Pharmacist  [phone]  Travis Salazar was given information about Chronic Care Management services today including:  1. CCM service includes personalized support from designated clinical staff supervised by his physician, including individualized plan of care and coordination with other care providers 2. 24/7 contact phone numbers for assistance for urgent and routine care needs. 3. Service will only be billed when office clinical staff spend 20 minutes or more in a month to coordinate care. 4. Only one practitioner may furnish and bill the service in a calendar month. 5. The patient may stop CCM services at any time (effective at the end of the month) by phone call to the office staff. 6. The patient will be responsible for cost sharing (co-pay) of up to 20% of the service fee (after annual deductible is met).  Patient agreed to services and verbal consent obtained.  

## 2018-11-25 NOTE — Chronic Care Management (AMB) (Addendum)
  Chronic Care Management   Initial Visit Note  11/25/2018 Name: Travis Salazar. MRN: [id number] DOB: [birth year]  Referred by: [name], MD Reason for referral : Chronic Care Management (initial visit)   Subjective: "Could I sign up for this program and you help me with this eye drop problem"  Objective:  BP Readings from Last 3 Encounters:  11/11/18 124/72  10/07/18 (!) 142/74  04/07/18 132/74   Assessment: [name], Salazar is an 82 year old male patient of Dr. Rosanna Randy who requested assistance from the CCM Team. He also acknowledges diagnosis of HTN and states he would benefit from implementing the same changes suggested to his wife during today's visit. He was last seen by his PCP 11/11/18 for HTN follow up.  Goals Addressed    . "I am having a hard time getting one of my eyedrop medications" (pt-stated)       Travis Salazar accompanies his wife to her initial visit with the CCM RN CM. Travis Salazar, who also qualifies for the CCM program request assistance with care coordination related to a newly prescribed eye drop. Travis Salazar states he is prescribed this medication for glaucoma however when he went to the pharacy to pick up, he was told his insurance did not cover. He states the pharmacist was going to call [facility] for a substitute medication but he has not received notification. He has attempted to contact pharmacy without success. He request assistance from the CCM Team.  Clinical Goals: Over the next 2 weeks, patient will verbalize plan of care related to his inability to obtain prescription eyedrop.  Interventions: Colloboration with [facility]. Left message at 1515 for Dr. Inda Coke nurse to call back.  11/25/18@ 4:40pm Spoke with Juliann Pulse at [facility]. Samples will be provided to patient. Patient notified.       AddendumJuliann Pulse from [facility] returned call. She has not received preauth request from pharmacy. She will provide samples for  patient. Notified patient and he will pick up tomorrow.   Plan: CCM RN CM will follow up with patient upon receipt of information regarding eyedrops from [facility]. CCM will contact pharmacy to request pre authorization to be sent to [facility].   Travis Salazar was given information about Chronic Care Management services today including:  1. CCM service includes personalized support from designated clinical staff supervised by his physician, including individualized plan of care and coordination with other care providers 2. 24/7 contact phone numbers for assistance for urgent and routine care needs. 3. Service will only be billed when office clinical staff spend 20 minutes or more in a month to coordinate care. 4. Only one practitioner may furnish and bill the service in a calendar month. 5. The patient may stop CCM services at any time (effective at the end of the month) by phone call to the office staff. 6. The patient will be responsible for cost sharing (co-pay) of up to 20% of the service fee (after annual deductible is met).  Patient agreed to services and verbal consent obtained.  Portia E. Rollene Rotunda, RN, BSN Nurse Care Coordinator [facility] Care Management [phone]

## 2018-11-26 ENCOUNTER — Ambulatory Visit: Payer: Self-pay

## 2018-11-26 DIAGNOSIS — E7800 Pure hypercholesterolemia, unspecified: Secondary | ICD-10-CM

## 2018-11-26 DIAGNOSIS — I10 Essential (primary) hypertension: Secondary | ICD-10-CM

## 2018-11-26 NOTE — Chronic Care Management (AMB) (Signed)
  Chronic Care Management   Note  11/26/2018 Name: Travis Salazar. MRN: [id number] DOB: 1936-05-03  Care Coordination: Spoke with Vicente Males at  [id number] [place], [place] [phone] (Phone)   Vicente Males will send over preauthorization information to [facility] for patients prescription of Rocklatan. Patient notified.  [name] E. Rollene Rotunda, RN, BSN Nurse Care Coordinator [facility] [phone]

## 2018-12-25 ENCOUNTER — Other Ambulatory Visit: Payer: Self-pay | Admitting: Family Medicine

## 2019-01-14 DIAGNOSIS — H401133 Primary open-angle glaucoma, bilateral, severe stage: Secondary | ICD-10-CM | POA: Diagnosis not present

## 2019-02-24 DIAGNOSIS — H401133 Primary open-angle glaucoma, bilateral, severe stage: Secondary | ICD-10-CM | POA: Diagnosis not present

## 2019-03-31 DIAGNOSIS — H401133 Primary open-angle glaucoma, bilateral, severe stage: Secondary | ICD-10-CM | POA: Diagnosis not present

## 2019-04-09 ENCOUNTER — Ambulatory Visit: Payer: Self-pay

## 2019-04-12 ENCOUNTER — Encounter: Payer: Self-pay | Admitting: Family Medicine

## 2019-04-14 ENCOUNTER — Other Ambulatory Visit: Payer: Self-pay

## 2019-04-14 ENCOUNTER — Ambulatory Visit (INDEPENDENT_AMBULATORY_CARE_PROVIDER_SITE_OTHER): Payer: Medicare Other

## 2019-04-14 DIAGNOSIS — Z0000 Encounter for general adult medical examination without abnormal findings: Secondary | ICD-10-CM | POA: Diagnosis not present

## 2019-04-14 NOTE — Patient Instructions (Addendum)
Travis Salazar , Thank you for taking time to come for your Medicare Wellness Visit. I appreciate your ongoing commitment to your health goals. Please review the following plan we discussed and let me know if I can assist you in the future.   Screening recommendations/referrals: Colonoscopy: No longer required.  Recommended yearly ophthalmology/optometry visit for glaucoma screening and checkup Recommended yearly dental visit for hygiene and checkup  Vaccinations: Influenza vaccine: Up to date Pneumococcal vaccine: Completed series Tdap vaccine: Pt declines today.  Shingles vaccine: Pt declines today.     Advanced directives: Please bring a copy of your POA (Power of Attorney) and/or Living Will to your next appointment.   Conditions/risks identified: Recommend to exercise for 3 days a week for at least 30 minutes at a time.   Next appointment: 07/22/19 @ 10:20 AM with Dr Rosanna Randy.   Preventive Care 50 Years and Older, Male Preventive care refers to lifestyle choices and visits with your health care provider that can promote health and wellness. What does preventive care include?  A yearly physical exam. This is also called an annual well check.  Dental exams once or twice a year.  Routine eye exams. Ask your health care provider how often you should have your eyes checked.  Personal lifestyle choices, including:  Daily care of your teeth and gums.  Regular physical activity.  Eating a healthy diet.  Avoiding tobacco and drug use.  Limiting alcohol use.  Practicing safe sex.  Taking low doses of aspirin every day.  Taking vitamin and mineral supplements as recommended by your health care provider. What happens during an annual well check? The services and screenings done by your health care provider during your annual well check will depend on your age, overall health, lifestyle risk factors, and family history of disease. Counseling  Your health care provider may ask you  questions about your:  Alcohol use.  Tobacco use.  Drug use.  Emotional well-being.  Home and relationship well-being.  Sexual activity.  Eating habits.  History of falls.  Memory and ability to understand (cognition).  Work and work Statistician. Screening  You may have the following tests or measurements:  Height, weight, and BMI.  Blood pressure.  Lipid and cholesterol levels. These may be checked every 5 years, or more frequently if you are over 2 years old.  Skin check.  Lung cancer screening. You may have this screening every year starting at age 16 if you have a 30-pack-year history of smoking and currently smoke or have quit within the past 15 years.  Fecal occult blood test (FOBT) of the stool. You may have this test every year starting at age 33.  Flexible sigmoidoscopy or colonoscopy. You may have a sigmoidoscopy every 5 years or a colonoscopy every 10 years starting at age 62.  Prostate cancer screening. Recommendations will vary depending on your family history and other risks.  Hepatitis C blood test.  Hepatitis B blood test.  Sexually transmitted disease (STD) testing.  Diabetes screening. This is done by checking your blood sugar (glucose) after you have not eaten for a while (fasting). You may have this done every 1-3 years.  Abdominal aortic aneurysm (AAA) screening. You may need this if you are a current or former smoker.  Osteoporosis. You may be screened starting at age 51 if you are at high risk. Talk with your health care provider about your test results, treatment options, and if necessary, the need for more tests. Vaccines  Your health  care provider may recommend certain vaccines, such as:  Influenza vaccine. This is recommended every year.  Tetanus, diphtheria, and acellular pertussis (Tdap, Td) vaccine. You may need a Td booster every 10 years.  Zoster vaccine. You may need this after age 35.  Pneumococcal 13-valent conjugate  (PCV13) vaccine. One dose is recommended after age 25.  Pneumococcal polysaccharide (PPSV23) vaccine. One dose is recommended after age 42. Talk to your health care provider about which screenings and vaccines you need and how often you need them. This information is not intended to replace advice given to you by your health care provider. Make sure you discuss any questions you have with your health care provider. Document Released: 12/29/2015 Document Revised: 08/21/2016 Document Reviewed: 10/03/2015 Elsevier Interactive Patient Education  2017 [place] Prevention in the Home Falls can cause injuries. They can happen to people of all ages. There are many things you can do to make your home safe and to help prevent falls. What can I do on the outside of my home?  Regularly fix the edges of walkways and driveways and fix any cracks.  Remove anything that might make you trip as you walk through a door, such as a raised step or threshold.  Trim any bushes or trees on the path to your home.  Use bright outdoor lighting.  Clear any walking paths of anything that might make someone trip, such as rocks or tools.  Regularly check to see if handrails are loose or broken. Make sure that both sides of any steps have handrails.  Any raised decks and porches should have guardrails on the edges.  Have any leaves, snow, or ice cleared regularly.  Use sand or salt on walking paths during winter.  Clean up any spills in your garage right away. This includes oil or grease spills. What can I do in the bathroom?  Use night lights.  Install grab bars by the toilet and in the tub and shower. Do not use towel bars as grab bars.  Use non-skid mats or decals in the tub or shower.  If you need to sit down in the shower, use a plastic, non-slip stool.  Keep the floor dry. Clean up any water that spills on the floor as soon as it happens.  Remove soap buildup in the tub or shower  regularly.  Attach bath mats securely with double-sided non-slip rug tape.  Do not have throw rugs and other things on the floor that can make you trip. What can I do in the bedroom?  Use night lights.  Make sure that you have a light by your bed that is easy to reach.  Do not use any sheets or blankets that are too big for your bed. They should not hang down onto the floor.  Have a firm chair that has side arms. You can use this for support while you get dressed.  Do not have throw rugs and other things on the floor that can make you trip. What can I do in the kitchen?  Clean up any spills right away.  Avoid walking on wet floors.  Keep items that you use a lot in easy-to-reach places.  If you need to reach something above you, use a strong step stool that has a grab bar.  Keep electrical cords out of the way.  Do not use floor polish or wax that makes floors slippery. If you must use wax, use non-skid floor wax.  Do not have  throw rugs and other things on the floor that can make you trip. What can I do with my stairs?  Do not leave any items on the stairs.  Make sure that there are handrails on both sides of the stairs and use them. Fix handrails that are broken or loose. Make sure that handrails are as long as the stairways.  Check any carpeting to make sure that it is firmly attached to the stairs. Fix any carpet that is loose or worn.  Avoid having throw rugs at the top or bottom of the stairs. If you do have throw rugs, attach them to the floor with carpet tape.  Make sure that you have a light switch at the top of the stairs and the bottom of the stairs. If you do not have them, ask someone to add them for you. What else can I do to help prevent falls?  Wear shoes that:  Do not have high heels.  Have rubber bottoms.  Are comfortable and fit you well.  Are closed at the toe. Do not wear sandals.  If you use a stepladder:  Make sure that it is fully  opened. Do not climb a closed stepladder.  Make sure that both sides of the stepladder are locked into place.  Ask someone to hold it for you, if possible.  Clearly mark and make sure that you can see:  Any grab bars or handrails.  First and last steps.  Where the edge of each step is.  Use tools that help you move around (mobility aids) if they are needed. These include:  Canes.  Walkers.  Scooters.  Crutches.  Turn on the lights when you go into a dark area. Replace any light bulbs as soon as they burn out.  Set up your furniture so you have a clear path. Avoid moving your furniture around.  If any of your floors are uneven, fix them.  If there are any pets around you, be aware of where they are.  Review your medicines with your doctor. Some medicines can make you feel dizzy. This can increase your chance of falling. Ask your doctor what other things that you can do to help prevent falls. This information is not intended to replace advice given to you by your health care provider. Make sure you discuss any questions you have with your health care provider. Document Released: 09/28/2009 Document Revised: 05/09/2016 Document Reviewed: 01/06/2015 Elsevier Interactive Patient Education  2017 [employer].

## 2019-04-14 NOTE — Progress Notes (Signed)
Subjective:   Travis Salazar. is a 83 y.o. male who presents for Medicare Annual/Subsequent preventive examination.    This visit is being conducted through telemedicine due to the COVID-19 pandemic. This patient has given me verbal consent via doximity to conduct this visit, patient states they are participating from their home address. Some vital signs may be absent or patient reported.    Patient identification: identified by name, DOB, and current address  Review of Systems:  N/A  Cardiac Risk Factors include: advanced age (>22men, >17 women);dyslipidemia;hypertension;male gender     Objective:    Vitals: There were no vitals taken for this visit.  There is no height or weight on file to calculate BMI. Unable to obtain vitals due to visit being conducted via telephonically.   Advanced Directives 04/14/2019 11/25/2018 04/07/2018 10/08/2016 04/04/2016  Does Patient Have a Medical Advance Directive? Yes Yes Yes Yes Yes  Type of Paramedic of [place];Living will [place];Living will Living will Living will;Healthcare Power of [place];Living will  Does patient want to make changes to medical advance directive? - No - Patient declined - - -  Copy of [place] in Chart? No - copy requested No - copy requested - - -    Tobacco Social History   Tobacco Use  Smoking Status Former Smoker  . Packs/day: 1.00  . Years: 50.00  . Pack years: 50.00  . Last attempt to quit: 12/15/2009  . Years since quitting: 9.3  Smokeless Tobacco Never Used     Counseling given: Not Answered   Clinical Intake:  Pre-visit preparation completed: Yes  Pain : No/denies pain Pain Score: 0-No pain     Nutritional Status: BMI > 30  Obese Nutritional Risks: None Diabetes: No  How often do you need to have someone help you when you read instructions, pamphlets, or other written materials from your doctor or  pharmacy?: 1 - Never  Interpreter Needed?: No  Information entered by :: Travis Salazar  Past Medical History:  Diagnosis Date  . Hyperlipidemia   . Hypertension    Past Surgical History:  Procedure Laterality Date  . LIPOMA EXCISION     neck  . PROSTATECTOMY     due to prostate cancer   Family History  Problem Relation Age of Onset  . COPD Mother   . Heart attack Father    Social History   Socioeconomic History  . Marital status: Married    Spouse name: Travis Salazar  . Number of children: 4  . Years of education: 10th grade  . Highest education level: 9th grade  Occupational History  . Occupation: retired  Scientific laboratory technician  . Financial resource strain: Not hard at all  . Food insecurity:    Worry: Never true    Inability: Never true  . Transportation needs:    Medical: No    Non-medical: No  Tobacco Use  . Smoking status: Former Smoker    Packs/day: 1.00    Years: 50.00    Pack years: 50.00    Last attempt to quit: 12/15/2009    Years since quitting: 9.3  . Smokeless tobacco: Never Used  Substance and Sexual Activity  . Alcohol use: No    Alcohol/week: 0.0 standard drinks  . Drug use: No  . Sexual activity: Not on file  Lifestyle  . Physical activity:    Days per week: 0 days    Minutes per session: 0 min  .  Stress: Not at all  Relationships  . Social connections:    Talks on phone: Patient refused    Gets together: Patient refused    Attends religious service: Patient refused    Active member of club or organization: Patient refused    Attends meetings of clubs or organizations: Patient refused    Relationship status: Patient refused  Other Topics Concern  . Not on file  Social History Narrative  . Not on file    Outpatient Encounter Medications as of 04/14/2019  Medication Sig  . allopurinol (ZYLOPRIM) 300 MG tablet TAKE 1 TABLET BY MOUTH EVERY DAY  . amLODipine (NORVASC) 10 MG tablet TAKE 1 TABLET BY MOUTH EVERY DAY  . aspirin 325 MG tablet Take  325 mg by mouth daily.   Marland Kitchen atorvastatin (LIPITOR) 10 MG tablet Take 1 tablet (10 mg total) by mouth at bedtime.  . colchicine 0.6 MG tablet Take 1 tablet (0.6 mg total) by mouth daily. (Patient taking differently: Take 0.6 mg by mouth daily. As needed)  . lisinopril (PRINIVIL,ZESTRIL) 40 MG tablet TAKE 1 TABLET BY MOUTH EVERY DAY  . predniSONE (STERAPRED UNI-PAK 48 TAB) 10 MG (48) TBPK tablet Taper for 12 days as prescribed. (Patient not taking: Reported on 11/11/2018)   No facility-administered encounter medications on file as of 04/14/2019.     Activities of Daily Living In your present state of health, do you have any difficulty performing the following activities: 04/14/2019 11/25/2018  Hearing? N N  Vision? Y N  Comment Due to Glaucoma in both eyes.  -  Difficulty concentrating or making decisions? N N  Walking or climbing stairs? N N  Dressing or bathing? N N  Doing errands, shopping? N N  Preparing Food and eating ? N N  Using the Toilet? N N  In the past six months, have you accidently leaked urine? N N  Do you have problems with loss of bowel control? N N  Managing your Medications? N N  Managing your Finances? N N  Housekeeping or managing your Housekeeping? N N  Some recent data might be hidden    Patient Care Team: Travis Salazar., MD as PCP - General (Family Medicine) [place], Travis [place] as Consulting Physician (Optometry) Travis Salazar, Travis Fairly, MD as Consulting Physician (Urology) Travis Goad, RN as Case Manager Travis Salazar, [facility] (Pharmacist)   Assessment:   This is a routine wellness examination for Travis Salazar.  Exercise Activities and Dietary recommendations Current Exercise Habits: The patient does not participate in regular exercise at present, Exercise limited by: None identified  Goals      Patient Stated   . "I am having a hard time getting one of my eyedrop medications" (pt-stated)      Clinical Goals: Over the next 2 weeks, patient will  verbalize plan of care related to his inability to obtain prescription eyedrop.  Interventions: [facility] with [facility]. Left message at 1515 for Travis Salazar nurse to call back.  11/25/18@ 4:40pm Spoke with Travis Salazar at [facility]. Samples will be provided to patient. Patient notified.   11/26/18 Spoke with Vicente Males at  [id number] - [place], [place] [phone] (Phone)  She will send over Pre Auth information to [facility]       Other   . DIET - REDUCE SUGAR INTAKE     Recommend cutting back on daily desserts to a couple times a week.     . Exercise  3x per week (30 min per time)     Recommend to exercise for 3 days a week for at least 30 minutes at a time.        Fall Risk: Fall Risk  04/14/2019 11/25/2018 04/07/2018 10/08/2016 04/04/2016  Falls in the past year? 0 0 No No No    FALL RISK PREVENTION PERTAINING TO THE HOME:  Any stairs in or around the home? No  If so, are there any without handrails? N/A  Home free of loose throw rugs in walkways, pet beds, electrical cords, etc? Yes  Adequate lighting in your home to reduce risk of falls? Yes   ASSISTIVE DEVICES UTILIZED TO PREVENT FALLS:  Life alert? No  Use of a cane, walker or w/c? No  Grab bars in the bathroom? Yes  Shower chair or bench in shower? Yes  Elevated toilet seat or a handicapped toilet? No   TIMED UP AND GO:  Was the test performed? No .    Depression Screen PHQ 2/9 Scores 04/14/2019 04/07/2018 04/07/2018 10/08/2016  PHQ - 2 Score 0 0 0 0  PHQ- 9 Score 0 0 - -    Cognitive Function     6CIT Screen 04/14/2019  What Year? 0 points  What month? 0 points  What time? 0 points  Count back from 20 0 points  Months in reverse 0 points  Repeat phrase 4 points  Total Score 4    Immunization History  Administered Date(s) Administered  . Pneumococcal Conjugate-13 10/04/2014  . Pneumococcal Polysaccharide-23 01/11/2013  . Zoster 11/13/2012     Qualifies for Shingles Vaccine? Yes  Zostavax completed 11/13/12. Due for Shingrix. Education has been provided regarding the importance of this vaccine. Pt has been advised to call insurance company to determine out of pocket expense. Advised may also receive vaccine at local pharmacy or Health Dept. Verbalized acceptance and understanding.  Tdap: Although this vaccine is not a covered service during a Wellness Exam, does the patient still wish to receive this vaccine today?  No .   Advised may receive this vaccine at local pharmacy or Health Dept. Aware to provide a copy of the vaccination record if obtained from local pharmacy or Health Dept. Verbalized acceptance and understanding.  Flu Vaccine: Up to date  Pneumococcal Vaccine: Up to date  Screening Tests Health Maintenance  Topic Date Due  . TETANUS/TDAP  10/16/1955  . INFLUENZA VACCINE  07/17/2019  . PNA vac Low Risk Adult  Completed   Cancer Screenings:  Colorectal Screening: No longer required.   Lung Cancer Screening: (Low Dose CT Chest recommended if Age 90-80 years, 30 pack-year currently smoking OR have quit w/in 15years.) does not qualify.   Additional Screening:  Vision Screening: Recommended annual ophthalmology exams for early detection of glaucoma and other disorders of the eye.  Dental Screening: Recommended annual dental exams for proper oral hygiene  Community Resource Referral:  CRR required this visit?  No        Plan:  I have personally reviewed and addressed the Medicare Annual Wellness questionnaire and have noted the following in the patient's chart:  A. Medical and social history B. Use of alcohol, tobacco or illicit drugs  C. Current medications and supplements D. Functional ability and status E.  Nutritional status F.  Physical activity G. Advance directives H. List of other physicians I.  Hospitalizations, surgeries, and ER visits in previous 12 months J.  [place] such as  hearing and vision if needed,  cognitive and depression L. Referrals and appointments   In addition, I have reviewed and discussed with patient certain preventive protocols, quality metrics, and best practice recommendations. A written personalized care plan for preventive services as well as general preventive health recommendations were provided to patient.   [place] Score, [place]  2/97/9892 Nurse Health Advisor   Nurse Notes: Pt declined receiving the tetanus vaccine at next in office visit.

## 2019-04-19 DIAGNOSIS — H401133 Primary open-angle glaucoma, bilateral, severe stage: Secondary | ICD-10-CM | POA: Diagnosis not present

## 2019-05-12 DIAGNOSIS — H401133 Primary open-angle glaucoma, bilateral, severe stage: Secondary | ICD-10-CM | POA: Diagnosis not present

## 2019-06-11 DIAGNOSIS — H401133 Primary open-angle glaucoma, bilateral, severe stage: Secondary | ICD-10-CM | POA: Diagnosis not present

## 2019-06-16 ENCOUNTER — Other Ambulatory Visit: Payer: Self-pay | Admitting: Family Medicine

## 2019-06-16 DIAGNOSIS — M10031 Idiopathic gout, right wrist: Secondary | ICD-10-CM

## 2019-06-26 ENCOUNTER — Emergency Department (HOSPITAL_COMMUNITY): Payer: Medicare Other

## 2019-06-26 ENCOUNTER — Inpatient Hospital Stay (HOSPITAL_COMMUNITY)
Admission: EM | Admit: 2019-06-26 | Discharge: 2019-06-28 | DRG: 064 | Disposition: A | Discharge status: Home Health | Payer: Medicare Other | Attending: Student | Admitting: Student

## 2019-06-26 ENCOUNTER — Encounter (HOSPITAL_COMMUNITY): Payer: Self-pay | Admitting: Radiology

## 2019-06-26 ENCOUNTER — Other Ambulatory Visit: Payer: Self-pay

## 2019-06-26 DIAGNOSIS — E7800 Pure hypercholesterolemia, unspecified: Secondary | ICD-10-CM | POA: Diagnosis not present

## 2019-06-26 DIAGNOSIS — Z8249 Family history of ischemic heart disease and other diseases of the circulatory system: Secondary | ICD-10-CM | POA: Diagnosis not present

## 2019-06-26 DIAGNOSIS — G459 Transient cerebral ischemic attack, unspecified: Secondary | ICD-10-CM | POA: Diagnosis not present

## 2019-06-26 DIAGNOSIS — Z87891 Personal history of nicotine dependence: Secondary | ICD-10-CM

## 2019-06-26 DIAGNOSIS — U071 COVID-19: Secondary | ICD-10-CM | POA: Diagnosis not present

## 2019-06-26 DIAGNOSIS — R911 Solitary pulmonary nodule: Secondary | ICD-10-CM | POA: Diagnosis not present

## 2019-06-26 DIAGNOSIS — Z6829 Body mass index (BMI) 29.0-29.9, adult: Secondary | ICD-10-CM

## 2019-06-26 DIAGNOSIS — R29810 Facial weakness: Secondary | ICD-10-CM | POA: Diagnosis not present

## 2019-06-26 DIAGNOSIS — D696 Thrombocytopenia, unspecified: Secondary | ICD-10-CM | POA: Diagnosis present

## 2019-06-26 DIAGNOSIS — G8194 Hemiplegia, unspecified affecting left nondominant side: Secondary | ICD-10-CM | POA: Diagnosis present

## 2019-06-26 DIAGNOSIS — Z8673 Personal history of transient ischemic attack (TIA), and cerebral infarction without residual deficits: Secondary | ICD-10-CM | POA: Diagnosis present

## 2019-06-26 DIAGNOSIS — Z7982 Long term (current) use of aspirin: Secondary | ICD-10-CM

## 2019-06-26 DIAGNOSIS — I340 Nonrheumatic mitral (valve) insufficiency: Secondary | ICD-10-CM | POA: Diagnosis not present

## 2019-06-26 DIAGNOSIS — I10 Essential (primary) hypertension: Secondary | ICD-10-CM | POA: Diagnosis not present

## 2019-06-26 DIAGNOSIS — I63411 Cerebral infarction due to embolism of right middle cerebral artery: Secondary | ICD-10-CM | POA: Diagnosis not present

## 2019-06-26 DIAGNOSIS — E785 Hyperlipidemia, unspecified: Secondary | ICD-10-CM | POA: Diagnosis present

## 2019-06-26 DIAGNOSIS — R000 Tachycardia, unspecified: Secondary | ICD-10-CM | POA: Diagnosis not present

## 2019-06-26 DIAGNOSIS — I63511 Cerebral infarction due to unspecified occlusion or stenosis of right middle cerebral artery: Secondary | ICD-10-CM

## 2019-06-26 DIAGNOSIS — R29711 NIHSS score 11: Secondary | ICD-10-CM | POA: Diagnosis not present

## 2019-06-26 DIAGNOSIS — I63233 Cerebral infarction due to unspecified occlusion or stenosis of bilateral carotid arteries: Secondary | ICD-10-CM | POA: Diagnosis not present

## 2019-06-26 DIAGNOSIS — Z8546 Personal history of malignant neoplasm of prostate: Secondary | ICD-10-CM | POA: Diagnosis not present

## 2019-06-26 DIAGNOSIS — R531 Weakness: Secondary | ICD-10-CM | POA: Diagnosis not present

## 2019-06-26 DIAGNOSIS — H5347 Heteronymous bilateral field defects: Secondary | ICD-10-CM | POA: Diagnosis present

## 2019-06-26 DIAGNOSIS — E669 Obesity, unspecified: Secondary | ICD-10-CM | POA: Diagnosis present

## 2019-06-26 DIAGNOSIS — R202 Paresthesia of skin: Secondary | ICD-10-CM | POA: Diagnosis present

## 2019-06-26 DIAGNOSIS — I639 Cerebral infarction, unspecified: Secondary | ICD-10-CM | POA: Diagnosis not present

## 2019-06-26 DIAGNOSIS — R7303 Prediabetes: Secondary | ICD-10-CM | POA: Diagnosis not present

## 2019-06-26 DIAGNOSIS — R0902 Hypoxemia: Secondary | ICD-10-CM | POA: Diagnosis not present

## 2019-06-26 DIAGNOSIS — Z825 Family history of asthma and other chronic lower respiratory diseases: Secondary | ICD-10-CM | POA: Diagnosis not present

## 2019-06-26 DIAGNOSIS — R414 Neurologic neglect syndrome: Secondary | ICD-10-CM | POA: Diagnosis present

## 2019-06-26 LAB — DIFFERENTIAL
Abs Immature Granulocytes: 0.08 10*3/uL — ABNORMAL HIGH (ref 0.00–0.07)
Basophils Absolute: 0 10*3/uL (ref 0.0–0.1)
Basophils Relative: 0 %
Eosinophils Absolute: 0.1 10*3/uL (ref 0.0–0.5)
Eosinophils Relative: 1 %
Immature Granulocytes: 1 %
Lymphocytes Relative: 21 %
Lymphs Abs: 2.4 10*3/uL (ref 0.7–4.0)
Monocytes Absolute: 1 10*3/uL (ref 0.1–1.0)
Monocytes Relative: 9 %
Neutro Abs: 8.2 10*3/uL — ABNORMAL HIGH (ref 1.7–7.7)
Neutrophils Relative %: 68 %

## 2019-06-26 LAB — I-STAT CHEM 8, ED
BUN: 20 mg/dL (ref 8–23)
Calcium, Ion: 1.15 mmol/L (ref 1.15–1.40)
Chloride: 104 mmol/L (ref 98–111)
Creatinine, Ser: 0.7 mg/dL (ref 0.61–1.24)
Glucose, Bld: 132 mg/dL — ABNORMAL HIGH (ref 70–99)
HCT: 46 % (ref 39.0–52.0)
Hemoglobin: 15.6 g/dL (ref 13.0–17.0)
Potassium: 3.6 mmol/L (ref 3.5–5.1)
Sodium: 138 mmol/L (ref 135–145)
TCO2: 23 mmol/L (ref 22–32)

## 2019-06-26 LAB — CBC
HCT: 45.8 % (ref 39.0–52.0)
Hemoglobin: 14.4 g/dL (ref 13.0–17.0)
MCH: 29.2 pg (ref 26.0–34.0)
MCHC: 31.4 g/dL (ref 30.0–36.0)
MCV: 92.9 fL (ref 80.0–100.0)
Platelets: 133 10*3/uL — ABNORMAL LOW (ref 150–400)
RBC: 4.93 MIL/uL (ref 4.22–5.81)
RDW: 14.2 % (ref 11.5–15.5)
WBC: 11.8 10*3/uL — ABNORMAL HIGH (ref 4.0–10.5)
nRBC: 0 % (ref 0.0–0.2)

## 2019-06-26 LAB — PROTIME-INR
INR: 1.4 — ABNORMAL HIGH (ref 0.8–1.2)
Prothrombin Time: 16.9 seconds — ABNORMAL HIGH (ref 11.4–15.2)

## 2019-06-26 LAB — COMPREHENSIVE METABOLIC PANEL
ALT: 23 U/L (ref 0–44)
AST: 24 U/L (ref 15–41)
Albumin: 3.3 g/dL — ABNORMAL LOW (ref 3.5–5.0)
Alkaline Phosphatase: 42 U/L (ref 38–126)
Anion gap: 16 — ABNORMAL HIGH (ref 5–15)
BUN: 19 mg/dL (ref 8–23)
CO2: 19 mmol/L — ABNORMAL LOW (ref 22–32)
Calcium: 9.5 mg/dL (ref 8.9–10.3)
Chloride: 103 mmol/L (ref 98–111)
Creatinine, Ser: 0.9 mg/dL (ref 0.61–1.24)
GFR calc Af Amer: >60 mL/min (ref >60)
GFR calc non Af Amer: >60 mL/min (ref >60)
Glucose, Bld: 132 mg/dL — ABNORMAL HIGH (ref 70–99)
Potassium: 3.6 mmol/L (ref 3.5–5.1)
Sodium: 138 mmol/L (ref 135–145)
Total Bilirubin: 1 mg/dL (ref 0.3–1.2)
Total Protein: 7.3 g/dL (ref 6.5–8.1)

## 2019-06-26 LAB — APTT: aPTT: 33 seconds (ref 24–36)

## 2019-06-26 LAB — SARS CORONAVIRUS 2 BY RT PCR (HOSPITAL ORDER, PERFORMED IN ~~LOC~~ HOSPITAL LAB): SARS Coronavirus 2: POSITIVE — AB

## 2019-06-26 LAB — CBG MONITORING, ED: Glucose-Capillary: 129 mg/dL — ABNORMAL HIGH (ref 70–99)

## 2019-06-26 MED ORDER — ACETAMINOPHEN 325 MG PO TABS: 650.0000 mg | ORAL_TABLET | Freq: Four times a day (QID) | ORAL | Status: DC | PRN

## 2019-06-26 MED ORDER — ASPIRIN 300 MG RE SUPP
300.0000 mg | Freq: Every day | RECTAL | Status: DC
  Filled 2019-06-26: qty 1

## 2019-06-26 MED ORDER — SODIUM CHLORIDE 0.9% FLUSH: 3.0000 mL | Freq: Once | INTRAVENOUS | Status: DC

## 2019-06-26 MED ORDER — ACETAMINOPHEN 650 MG RE SUPP: 650.0000 mg | Freq: Four times a day (QID) | RECTAL | Status: DC | PRN

## 2019-06-26 MED ORDER — ASPIRIN 81 MG PO CHEW
324.0000 mg | CHEWABLE_TABLET | Freq: Once | ORAL | Status: AC
  Administered 2019-06-26: 16:00:00 324 mg via ORAL
  Filled 2019-06-26: qty 4

## 2019-06-26 MED ORDER — ONDANSETRON HCL 4 MG PO TABS: 4.0000 mg | ORAL_TABLET | Freq: Four times a day (QID) | ORAL | Status: DC | PRN

## 2019-06-26 MED ORDER — ASPIRIN EC 81 MG PO TBEC: 81.0000 mg | DELAYED_RELEASE_TABLET | Freq: Every day | ORAL | Status: DC

## 2019-06-26 MED ORDER — ASPIRIN 325 MG PO TABS
325.0000 mg | ORAL_TABLET | Freq: Every day | ORAL | Status: DC
  Administered 2019-06-27: 325 mg via ORAL
  Filled 2019-06-26: qty 1

## 2019-06-26 MED ORDER — STROKE: EARLY STAGES OF RECOVERY BOOK: Freq: Once | Status: DC

## 2019-06-26 MED ORDER — ONDANSETRON HCL 4 MG/2ML IJ SOLN: 4.0000 mg | Freq: Four times a day (QID) | INTRAMUSCULAR | Status: DC | PRN

## 2019-06-26 MED ORDER — ENOXAPARIN SODIUM 40 MG/0.4ML ~~LOC~~ SOLN
40.0000 mg | SUBCUTANEOUS | Status: DC
  Administered 2019-06-26 – 2019-06-27 (×2): 40 mg via SUBCUTANEOUS
  Filled 2019-06-26 (×2): qty 0.4

## 2019-06-26 MED ORDER — ATORVASTATIN CALCIUM 80 MG PO TABS: 80.0000 mg | ORAL_TABLET | Freq: Every day | ORAL | Status: DC

## 2019-06-26 MED ORDER — IOHEXOL 350 MG/ML SOLN
90.0000 mL | Freq: Once | INTRAVENOUS | Status: AC | PRN
  Administered 2019-06-26: 12:00:00 90 mL via INTRAVENOUS

## 2019-06-26 NOTE — ED Provider Notes (Signed)
[facility] EMERGENCY DEPARTMENT Provider Note   CSN: [id number] Arrival date & time: 06/26/19  1151     History   Chief Complaint Chief Complaint  Patient presents with   Code Stroke    HPI Travis Salazar. is a 83 y.o. male.     HPI   76yM code stroke. Past hx of HTN and HLD.  Per EMS, last known normal around 1800 yesterday as reported by son. This morning son noticed that pt was weak on L side and had difficulty swallowing food. On initial examination with L sided weakness/neglect. Pt with no complaints though.   Past Medical History:  Diagnosis Date   Hyperlipidemia    Hypertension     Patient Active Problem List   Diagnosis Date Noted   Wrist swelling, right 06/03/2017   S/P prostatectomy 10/08/2016   Snores 10/08/2016   Essential (primary) hypertension 09/27/2015   History of tobacco use 09/27/2015   Hypercholesterolemia 09/27/2015   Blood glucose elevated 09/27/2015   Adiposity 09/27/2015    Past Surgical History:  Procedure Laterality Date   LIPOMA EXCISION     neck   PROSTATECTOMY     due to prostate cancer      Home Medications    Prior to Admission medications   Medication Sig Start Date End Date Taking? Authorizing Provider  allopurinol (ZYLOPRIM) 300 MG tablet TAKE 1 TABLET BY MOUTH EVERY DAY 06/16/19   [name], MD  amLODipine (NORVASC) 10 MG tablet TAKE 1 TABLET BY MOUTH EVERY DAY 10/13/18   [name], MD  aspirin 325 MG tablet Take 325 mg by mouth daily.  12/03/11   [provider]  atorvastatin (LIPITOR) 10 MG tablet Take 1 tablet (10 mg total) by mouth at bedtime. 11/05/18   [name], MD  colchicine 0.6 MG tablet Take 1 tablet (0.6 mg total) by mouth daily. Patient taking differently: Take 0.6 mg by mouth daily. As needed 05/01/17   [name], MD  lisinopril (PRINIVIL,ZESTRIL) 40 MG tablet TAKE 1 TABLET BY MOUTH EVERY DAY 12/25/18   [name], MD  predniSONE (STERAPRED UNI-PAK 48 TAB) 10 MG (48) TBPK tablet Taper for 12 days as prescribed. Patient not taking: Reported on 11/11/2018 06/03/17   [name], MD    Family History Family History  Problem Relation Age of Onset   COPD Mother    Heart attack Father     Social History Social History   Tobacco Use   Smoking status: Former Smoker    Packs/day: 1.00    Years: 50.00    Pack years: 50.00    Quit date: 12/15/2009    Years since quitting: 9.5   Smokeless tobacco: Never Used  Substance Use Topics   Alcohol use: No    Alcohol/week: 0.0 standard drinks   Drug use: No     Allergies   Patient has no known allergies.   Review of Systems Review of Systems   All systems reviewed and negative, other than as noted in HPI.  Marland KitchenPhysical Exam Updated Vital Signs BP 133/73    Pulse (!) 122    Temp 98.5 F (36.9 C) (Oral)    Resp 18    Ht 5\' 9"  (1.753 m)    Wt 100.2 kg    SpO2 92%    BMI 32.64 kg/m   Physical Exam Vitals signs and nursing note reviewed.  Constitutional:  General: He is not in acute distress.    Appearance: He is well-developed.  HENT:     Head: Normocephalic and atraumatic.  Eyes:     General:        Right eye: No discharge.        Left eye: No discharge.     Conjunctiva/sclera: Conjunctivae normal.  Neck:     Musculoskeletal: Neck supple.  Cardiovascular:     Rate and Rhythm: Normal rate and regular rhythm.     Heart sounds: Normal heart sounds. No murmur. No friction rub. No gallop.   Pulmonary:     Effort: Pulmonary effort is normal. No respiratory distress.     Breath sounds: Normal breath sounds.  Abdominal:     General: There is no distension.     Palpations: Abdomen is soft.     Tenderness: There is no abdominal tenderness.  Musculoskeletal:        General: No tenderness.  Skin:    General: Skin is warm and dry.  Neurological:     Mental Status: He is alert.     Comments: Speech clear. CN 2-12  appear aside from limitations in exam from L sided neglect. L sided weakness.   Psychiatric:        Behavior: Behavior normal.        Thought Content: Thought content normal.      ED Treatments / Results  Labs (all labs ordered are listed, but only abnormal results are displayed) Labs Reviewed  PROTIME-INR - Abnormal; Notable for the following components:      Result Value   Prothrombin Time 16.9 (*)    INR 1.4 (*)    All other components within normal limits  CBC - Abnormal; Notable for the following components:   WBC 11.8 (*)    Platelets 133 (*)    All other components within normal limits  DIFFERENTIAL - Abnormal; Notable for the following components:   Neutro Abs 8.2 (*)    Abs Immature Granulocytes 0.08 (*)    All other components within normal limits  COMPREHENSIVE METABOLIC PANEL - Abnormal; Notable for the following components:   CO2 19 (*)    Glucose, Bld 132 (*)    Albumin 3.3 (*)    Anion gap 16 (*)    All other components within normal limits  I-STAT CHEM 8, ED - Abnormal; Notable for the following components:   Glucose, Bld 132 (*)    All other components within normal limits  CBG MONITORING, ED - Abnormal; Notable for the following components:   Glucose-Capillary 129 (*)    All other components within normal limits  SARS CORONAVIRUS 2 (HOSPITAL ORDER, [facility])  APTT    EKG None  Radiology Ct Angio Head W Or Wo Contrast  Result Date: 06/26/2019 CLINICAL DATA:  Acute onset of left-sided weakness. Abnormal CT of the head. EXAM: CT ANGIOGRAPHY HEAD AND NECK CT PERFUSION BRAIN TECHNIQUE: Multidetector CT imaging of the head and neck was performed using the standard protocol during bolus administration of intravenous contrast. Multiplanar CT image reconstructions and MIPs were obtained to evaluate the vascular anatomy. Carotid stenosis measurements (when applicable) are obtained utilizing NASCET criteria, using the distal internal  carotid diameter as the denominator. Multiphase CT imaging of the brain was performed following IV bolus contrast injection. Subsequent parametric perfusion maps were calculated using RAPID software. CONTRAST:  28mL OMNIPAQUE IOHEXOL 350 MG/ML SOLN COMPARISON:  CT head without contrast 06/26/2019 FINDINGS: CTA  NECK FINDINGS Aortic arch: Atherosclerotic calcifications are present at the aortic arch. There is no aneurysm. No significant stenosis is present at the origins the great vessels. A standard three-vessel arch configuration is present. Right carotid system: There is some tortuosity of the cervical right ICA without a significant stenosis. Atherosclerotic changes are noted at the right carotid bifurcation without a significant stenosis. There is tortuosity and additional mural calcifications in the distal cervical right ICA without significant stenosis. Left carotid system: The left common carotid artery within normal limits. There is artifact the thoracic inlet. Mild atherosclerotic changes are present in the distal cervical left common carotid artery without a significant luminal stenosis relative to the more distal vessel. Atherosclerotic changes are present at the left carotid bifurcation and proximal left ICA without a significant stenosis relative to the more distal vessel. There is tortuosity and additional distal mural calcification without significant stenosis. Vertebral arteries: The vertebral arteries originate from the subclavian arteries bilaterally. Atherosclerotic calcifications are present bilaterally. A 60-70% stenosis is present at the origin of the left vertebral artery, the dominant vessel. There is focal stenosis of the right vertebral artery at the C6 level as the vessel enters the spinal canal. Additional segmental narrowing is present throughout the right vertebral artery in the neck. Other significant stenoses are present in the left vertebral artery in the neck. Skeleton: Multilevel  degenerative changes present throughout the cervical spine. There straightening of cervical lordosis. Endplate changes and uncovertebral spurring is most evident at C3-4, C5-6, and C6-7. No focal lytic or blastic lesions are present. Other neck: A calcified nodule in the right lobe of the thyroid measures 10 mm. No other focal thyroid lesions are present. No focal mucosal or submucosal lesions are present. Salivary glands are normal. No significant adenopathy is present. Upper chest: A pleural based nodule in the right upper lobe measures 6 mm. Airspace opacification is noted peripherally in the upper lobes bilaterally. No other discrete nodule is present. Review of the MIP images confirms the above findings CTA HEAD FINDINGS Anterior circulation: Atherosclerotic calcifications are present within the cavernous right internal carotid artery. There is no significant stenosis relative to the more distal vessel. Similar calcifications are present in the cavernous left internal carotid artery without a significant stenosis relative to the distal vessel. The ICA termini are within normal limits bilaterally. The A1 segments are normal. There is an early bifurcation the right M1 segment. There is mild narrowing of the proximal M2 segments bilaterally. Diffuse asymmetric attenuation right MCA branch vessels is present without a significant proximal stenosis or occlusion Posterior circulation: The left vertebral artery is the dominant vessel. There is moderate narrowing of the right V4 segment without occlusion. Vertebrobasilar junction is within normal limits. The basilar artery is small. There is a moderate proximal stenosis in the mid basilar artery. Left posterior cerebral artery originates from basilar tip. The right posterior cerebral artery is of fetal type. There is moderate attenuation of distal PCA branch vessels bilaterally without a significant proximal stenosis or occlusion. Venous sinuses: The dural sinuses are  patent. The right transverse sinus is dominant. Straight sinus deep cerebral veins are intact. Cortical veins are unremarkable. Anatomic variants: Fetal type right posterior cerebral artery. Review of the MIP images confirms the above findings CT Brain Perfusion Findings: ASPECTS: 5/10 CBF (<30%) Volume: 26mL Perfusion (Tmax>6.0s) volume: 57mL Mismatch Volume: 60mL Infarction Location:Right MCA territory IMPRESSION: 1. CT perfusion results are discordant from the noncontrast head CT. This likely represents pseudo normalization. Noncontrast head  CT demonstrates a large right MCA territory infarct with an aspects score of 5/10. 2. No emergent large vessel occlusion. 3. Asymmetric atherosclerotic changes in the right MCA branch vessels including proximal M2 segments corresponding to the infarct territory. 4. Atherosclerotic changes involving the aortic arch, carotid bifurcations, and cavernous internal carotid arteries bilaterally without significant stenosis. 5. Multilevel degenerative changes the cervical spine. 6. 6 mm pleural-based right upper lobe pulmonary nodule. Additional airspace opacification is present laterally in the upper lobes bilaterally. This may be related to fibrotic change. Follow-up non-contrast CT recommended at 3-6 months to confirm persistence. If unchanged, and solid component remains <6 mm, annual CT is recommended until 5 years of stability has been established. If persistent these nodules should be considered highly suspicious if the solid component of the nodule is 6 mm or greater in size and enlarging. This recommendation follows the consensus statement: Guidelines for Management of Incidental Pulmonary Nodules Detected on CT Images: From the Fleischner Society 2017; Radiology 2017; 284:228-243. Electronically Signed   By: San Morelle M.D.   On: 06/26/2019 12:58   Ct Angio Neck W Or Wo Contrast  Result Date: 06/26/2019 CLINICAL DATA:  Acute onset of left-sided weakness.  Abnormal CT of the head. EXAM: CT ANGIOGRAPHY HEAD AND NECK CT PERFUSION BRAIN TECHNIQUE: Multidetector CT imaging of the head and neck was performed using the standard protocol during bolus administration of intravenous contrast. Multiplanar CT image reconstructions and MIPs were obtained to evaluate the vascular anatomy. Carotid stenosis measurements (when applicable) are obtained utilizing NASCET criteria, using the distal internal carotid diameter as the denominator. Multiphase CT imaging of the brain was performed following IV bolus contrast injection. Subsequent parametric perfusion maps were calculated using RAPID software. CONTRAST:  30mL OMNIPAQUE IOHEXOL 350 MG/ML SOLN COMPARISON:  CT head without contrast 06/26/2019 FINDINGS: CTA NECK FINDINGS Aortic arch: Atherosclerotic calcifications are present at the aortic arch. There is no aneurysm. No significant stenosis is present at the origins the great vessels. A standard three-vessel arch configuration is present. Right carotid system: There is some tortuosity of the cervical right ICA without a significant stenosis. Atherosclerotic changes are noted at the right carotid bifurcation without a significant stenosis. There is tortuosity and additional mural calcifications in the distal cervical right ICA without significant stenosis. Left carotid system: The left common carotid artery within normal limits. There is artifact the thoracic inlet. Mild atherosclerotic changes are present in the distal cervical left common carotid artery without a significant luminal stenosis relative to the more distal vessel. Atherosclerotic changes are present at the left carotid bifurcation and proximal left ICA without a significant stenosis relative to the more distal vessel. There is tortuosity and additional distal mural calcification without significant stenosis. Vertebral arteries: The vertebral arteries originate from the subclavian arteries bilaterally. Atherosclerotic  calcifications are present bilaterally. A 60-70% stenosis is present at the origin of the left vertebral artery, the dominant vessel. There is focal stenosis of the right vertebral artery at the C6 level as the vessel enters the spinal canal. Additional segmental narrowing is present throughout the right vertebral artery in the neck. Other significant stenoses are present in the left vertebral artery in the neck. Skeleton: Multilevel degenerative changes present throughout the cervical spine. There straightening of cervical lordosis. Endplate changes and uncovertebral spurring is most evident at C3-4, C5-6, and C6-7. No focal lytic or blastic lesions are present. Other neck: A calcified nodule in the right lobe of the thyroid measures 10 mm. No other focal thyroid  lesions are present. No focal mucosal or submucosal lesions are present. Salivary glands are normal. No significant adenopathy is present. Upper chest: A pleural based nodule in the right upper lobe measures 6 mm. Airspace opacification is noted peripherally in the upper lobes bilaterally. No other discrete nodule is present. Review of the MIP images confirms the above findings CTA HEAD FINDINGS Anterior circulation: Atherosclerotic calcifications are present within the cavernous right internal carotid artery. There is no significant stenosis relative to the more distal vessel. Similar calcifications are present in the cavernous left internal carotid artery without a significant stenosis relative to the distal vessel. The ICA termini are within normal limits bilaterally. The A1 segments are normal. There is an early bifurcation the right M1 segment. There is mild narrowing of the proximal M2 segments bilaterally. Diffuse asymmetric attenuation right MCA branch vessels is present without a significant proximal stenosis or occlusion Posterior circulation: The left vertebral artery is the dominant vessel. There is moderate narrowing of the right V4 segment  without occlusion. Vertebrobasilar junction is within normal limits. The basilar artery is small. There is a moderate proximal stenosis in the mid basilar artery. Left posterior cerebral artery originates from basilar tip. The right posterior cerebral artery is of fetal type. There is moderate attenuation of distal PCA branch vessels bilaterally without a significant proximal stenosis or occlusion. Venous sinuses: The dural sinuses are patent. The right transverse sinus is dominant. Straight sinus deep cerebral veins are intact. Cortical veins are unremarkable. Anatomic variants: Fetal type right posterior cerebral artery. Review of the MIP images confirms the above findings CT Brain Perfusion Findings: ASPECTS: 5/10 CBF (<30%) Volume: 69mL Perfusion (Tmax>6.0s) volume: 74mL Mismatch Volume: 7mL Infarction Location:Right MCA territory IMPRESSION: 1. CT perfusion results are discordant from the noncontrast head CT. This likely represents pseudo normalization. Noncontrast head CT demonstrates a large right MCA territory infarct with an aspects score of 5/10. 2. No emergent large vessel occlusion. 3. Asymmetric atherosclerotic changes in the right MCA branch vessels including proximal M2 segments corresponding to the infarct territory. 4. Atherosclerotic changes involving the aortic arch, carotid bifurcations, and cavernous internal carotid arteries bilaterally without significant stenosis. 5. Multilevel degenerative changes the cervical spine. 6. 6 mm pleural-based right upper lobe pulmonary nodule. Additional airspace opacification is present laterally in the upper lobes bilaterally. This may be related to fibrotic change. Follow-up non-contrast CT recommended at 3-6 months to confirm persistence. If unchanged, and solid component remains <6 mm, annual CT is recommended until 5 years of stability has been established. If persistent these nodules should be considered highly suspicious if the solid component of the  nodule is 6 mm or greater in size and enlarging. This recommendation follows the consensus statement: Guidelines for Management of Incidental Pulmonary Nodules Detected on CT Images: From the Fleischner Society 2017; Radiology 2017; 284:228-243. Electronically Signed   By: San Morelle M.D.   On: 06/26/2019 12:58   Ct Cerebral Perfusion W Contrast  Result Date: 06/26/2019 CLINICAL DATA:  Acute onset of left-sided weakness. Abnormal CT of the head. EXAM: CT ANGIOGRAPHY HEAD AND NECK CT PERFUSION BRAIN TECHNIQUE: Multidetector CT imaging of the head and neck was performed using the standard protocol during bolus administration of intravenous contrast. Multiplanar CT image reconstructions and MIPs were obtained to evaluate the vascular anatomy. Carotid stenosis measurements (when applicable) are obtained utilizing NASCET criteria, using the distal internal carotid diameter as the denominator. Multiphase CT imaging of the brain was performed following IV bolus contrast injection. Subsequent parametric  perfusion maps were calculated using RAPID software. CONTRAST:  62mL OMNIPAQUE IOHEXOL 350 MG/ML SOLN COMPARISON:  CT head without contrast 06/26/2019 FINDINGS: CTA NECK FINDINGS Aortic arch: Atherosclerotic calcifications are present at the aortic arch. There is no aneurysm. No significant stenosis is present at the origins the great vessels. A standard three-vessel arch configuration is present. Right carotid system: There is some tortuosity of the cervical right ICA without a significant stenosis. Atherosclerotic changes are noted at the right carotid bifurcation without a significant stenosis. There is tortuosity and additional mural calcifications in the distal cervical right ICA without significant stenosis. Left carotid system: The left common carotid artery within normal limits. There is artifact the thoracic inlet. Mild atherosclerotic changes are present in the distal cervical left common carotid  artery without a significant luminal stenosis relative to the more distal vessel. Atherosclerotic changes are present at the left carotid bifurcation and proximal left ICA without a significant stenosis relative to the more distal vessel. There is tortuosity and additional distal mural calcification without significant stenosis. Vertebral arteries: The vertebral arteries originate from the subclavian arteries bilaterally. Atherosclerotic calcifications are present bilaterally. A 60-70% stenosis is present at the origin of the left vertebral artery, the dominant vessel. There is focal stenosis of the right vertebral artery at the C6 level as the vessel enters the spinal canal. Additional segmental narrowing is present throughout the right vertebral artery in the neck. Other significant stenoses are present in the left vertebral artery in the neck. Skeleton: Multilevel degenerative changes present throughout the cervical spine. There straightening of cervical lordosis. Endplate changes and uncovertebral spurring is most evident at C3-4, C5-6, and C6-7. No focal lytic or blastic lesions are present. Other neck: A calcified nodule in the right lobe of the thyroid measures 10 mm. No other focal thyroid lesions are present. No focal mucosal or submucosal lesions are present. Salivary glands are normal. No significant adenopathy is present. Upper chest: A pleural based nodule in the right upper lobe measures 6 mm. Airspace opacification is noted peripherally in the upper lobes bilaterally. No other discrete nodule is present. Review of the MIP images confirms the above findings CTA HEAD FINDINGS Anterior circulation: Atherosclerotic calcifications are present within the cavernous right internal carotid artery. There is no significant stenosis relative to the more distal vessel. Similar calcifications are present in the cavernous left internal carotid artery without a significant stenosis relative to the distal vessel. The  ICA termini are within normal limits bilaterally. The A1 segments are normal. There is an early bifurcation the right M1 segment. There is mild narrowing of the proximal M2 segments bilaterally. Diffuse asymmetric attenuation right MCA branch vessels is present without a significant proximal stenosis or occlusion Posterior circulation: The left vertebral artery is the dominant vessel. There is moderate narrowing of the right V4 segment without occlusion. Vertebrobasilar junction is within normal limits. The basilar artery is small. There is a moderate proximal stenosis in the mid basilar artery. Left posterior cerebral artery originates from basilar tip. The right posterior cerebral artery is of fetal type. There is moderate attenuation of distal PCA branch vessels bilaterally without a significant proximal stenosis or occlusion. Venous sinuses: The dural sinuses are patent. The right transverse sinus is dominant. Straight sinus deep cerebral veins are intact. Cortical veins are unremarkable. Anatomic variants: Fetal type right posterior cerebral artery. Review of the MIP images confirms the above findings CT Brain Perfusion Findings: ASPECTS: 5/10 CBF (<30%) Volume: 73mL Perfusion (Tmax>6.0s) volume: 70mL Mismatch Volume:  80mL Infarction Location:Right MCA territory IMPRESSION: 1. CT perfusion results are discordant from the noncontrast head CT. This likely represents pseudo normalization. Noncontrast head CT demonstrates a large right MCA territory infarct with an aspects score of 5/10. 2. No emergent large vessel occlusion. 3. Asymmetric atherosclerotic changes in the right MCA branch vessels including proximal M2 segments corresponding to the infarct territory. 4. Atherosclerotic changes involving the aortic arch, carotid bifurcations, and cavernous internal carotid arteries bilaterally without significant stenosis. 5. Multilevel degenerative changes the cervical spine. 6. 6 mm pleural-based right upper lobe  pulmonary nodule. Additional airspace opacification is present laterally in the upper lobes bilaterally. This may be related to fibrotic change. Follow-up non-contrast CT recommended at 3-6 months to confirm persistence. If unchanged, and solid component remains <6 mm, annual CT is recommended until 5 years of stability has been established. If persistent these nodules should be considered highly suspicious if the solid component of the nodule is 6 mm or greater in size and enlarging. This recommendation follows the consensus statement: Guidelines for Management of Incidental Pulmonary Nodules Detected on CT Images: From the Fleischner Society 2017; Radiology 2017; 284:228-243. Electronically Signed   By: San Morelle M.D.   On: 06/26/2019 12:58   Ct Head Code Stroke Wo Contrast  Result Date: 06/26/2019 CLINICAL DATA:  Code stroke.  Acute onset of left-sided weakness. EXAM: CT HEAD WITHOUT CONTRAST TECHNIQUE: Contiguous axial images were obtained from the base of the skull through the vertex without intravenous contrast. COMPARISON:  None FINDINGS: Brain: Extensive right MCA territory cortical hypoattenuation is present. There is involvement of the anterior right frontal lobe, diffuse involvement of the insular cortex, involving the superior temporal gyrus and right parietal lobe. There is partial involvement of the right lentiform nucleus. Parietal involvement extends into the super ganglionic segments. No hemorrhage is present. There is mild diffuse effacement of the sulci with some mass effect. No significant midline shift is present. Left hemisphere is within normal limits. Brainstem and cerebellum are normal. Vascular: Atherosclerotic calcifications are present within the cavernous internal carotid arteries bilaterally. No significant asymmetric vessel hyperdensity is present. Additional atherosclerotic calcifications are present at the dural margin of the left vertebral artery. Skull: Calvarium is  intact. No focal lytic or blastic lesions are present. Sinuses/Orbits: The paranasal sinuses and mastoid air cells are clear. The globes and orbits are within normal limits. ASPECTS [facility] Stroke Program Early CT Score) - Ganglionic level infarction (caudate, lentiform nuclei, internal capsule, insula, M1-M3 cortex): 3/7 - Supraganglionic infarction (M4-M6 cortex): 2/3 Total score (0-10 with 10 being normal): 5/10 IMPRESSION: 1. Large right MCA territory infarct without hemorrhage. 2. Early mass effect with partial effacement the sulci but no significant midline shift. 3. Atherosclerosis 4. ASPECTS is 5/10 These results were called by telephone at the time of interpretation on 06/26/2019 at 12:18 pm to Dr. Virgel Manifold , who verbally acknowledged these results. Electronically Signed   By: San Morelle M.D.   On: 06/26/2019 12:22    Procedures Procedures (including critical care time)  CRITICAL CARE Performed by: Virgel Manifold Total critical care time: 35 minutes Critical care time was exclusive of separately billable procedures and treating other patients. Critical care was necessary to treat or prevent imminent or life-threatening deterioration. Critical care was time spent personally by me on the following activities: development of treatment plan with patient and/or surrogate as well as nursing, discussions with consultants, evaluation of patient's response to treatment, examination of patient, obtaining history from patient or surrogate, ordering  and performing treatments and interventions, ordering and review of laboratory studies, ordering and review of radiographic studies, pulse oximetry and re-evaluation of patient's condition.   Medications Ordered in ED Medications  sodium chloride flush (NS) 0.9 % injection 3 mL (has no administration in time range)  aspirin chewable tablet 324 mg (has no administration in time range)  iohexol (OMNIPAQUE) 350 MG/ML injection 90 mL (90 mLs  Intravenous Contrast Given 06/26/19 1208)     Initial Impression / Assessment and Plan / ED Course  I have reviewed the triage vital signs and the nursing notes.  Pertinent labs & imaging results that were available during my care of the patient were reviewed by me and considered in my medical decision making (see chart for details).     31yM with R MCA stroke. Not candidate for emergent intervention. Admit for stroke w/u.   Final Clinical Impressions(s) / ED Diagnoses   Final diagnoses:  Acute ischemic right MCA stroke [facility])    ED Discharge Orders    None       Virgel Manifold, MD 06/27/19 1246

## 2019-06-26 NOTE — ED Triage Notes (Signed)
TC from Pt Daughter . Daughter

## 2019-06-26 NOTE — ED Triage Notes (Signed)
PT last seen normal on Friday between 1800-1900 by son. Pt lives alone

## 2019-06-26 NOTE — Consult Note (Signed)
Neurology Consultation  Reason for Consult: Code stroke Referring Physician: Dr. Wilson Singer  CC: left side weakness and neglect  History is obtained from: Per EMS report   HPI: Travis Salazar. is a 83 y.o. male who presents to [facility] ED for left side weakness and neglect.   Travis Salazar is an 83 year old male with past medical history of HTN, and HLD who presents to [facility] ED for left side weakness and neglect per EMS.  Per EMS LKN at 1800 on 06/25/2019 and was noted to have left side weakness, neglect and inability to swallow his his breakfast this am by his son.  CT head reveals large Right MCA infarct.  Patient not requiring acute treatment and will be admitted to hospital for complete stroke workup  Patient does not voice any complaints and is unsure why he was sent to the hospital.    LKW: 7/10/202 @ 1800 tpa given?: no, outside IV TPA window Premorbid modified Rankin scale (mRS):  1-No significant post stroke disability and can perform usual duties with stroke symptoms  ROS: A 14 point ROS was performed and is negative except as noted in the HPI.    Past Medical History:  Diagnosis Date  . Hyperlipidemia   . Hypertension     Essential (primary) hypertension and hyperlidemia   Family History  Problem Relation Age of Onset  . COPD Mother   . Heart attack Father     Social History:   reports that he quit smoking about 9 years ago. He has a 50.00 pack-year smoking history. He has never used smokeless tobacco. He reports that he does not drink alcohol or use drugs.  Medications  Current Facility-Administered Medications:  .  sodium chloride flush (NS) 0.9 % injection 3 mL, 3 mL, Intravenous, Once, Virgel Manifold, MD  Current Outpatient Medications:  .  allopurinol (ZYLOPRIM) 300 MG tablet, TAKE 1 TABLET BY MOUTH EVERY DAY, Disp: 90 tablet, Rfl: 3 .  amLODipine (NORVASC) 10 MG tablet, TAKE 1 TABLET BY MOUTH EVERY DAY, Disp: 90 tablet, Rfl: 3 .  aspirin 325 MG tablet, Take 325 mg by  mouth daily. , Disp: , Rfl:  .  atorvastatin (LIPITOR) 10 MG tablet, Take 1 tablet (10 mg total) by mouth at bedtime., Disp: 90 tablet, Rfl: 3 .  colchicine 0.6 MG tablet, Take 1 tablet (0.6 mg total) by mouth daily. (Patient taking differently: Take 0.6 mg by mouth daily. As needed), Disp: 60 tablet, Rfl: 5 .  lisinopril (PRINIVIL,ZESTRIL) 40 MG tablet, TAKE 1 TABLET BY MOUTH EVERY DAY, Disp: 90 tablet, Rfl: 3 .  predniSONE (STERAPRED UNI-PAK 48 TAB) 10 MG (48) TBPK tablet, Taper for 12 days as prescribed. (Patient not taking: Reported on 11/11/2018), Disp: 48 tablet, Rfl: 0   Exam: Current vital signs: BP 133/73   Pulse (!) 122   Temp 98.5 F (36.9 C) (Oral)   Resp 18   Ht 5\' 9"  (1.753 m)   Wt 100.2 kg   SpO2 92%   BMI 32.64 kg/m  Vital signs in last 24 hours: Temp:  [98.5 F (36.9 C)] 98.5 F (36.9 C) (07/11 1232) Pulse Rate:  [122] 122 (07/11 1222) Resp:  [18] 18 (07/11 1222) BP: (133)/(73) 133/73 (07/11 1222) SpO2:  [92 %] 92 % (07/11 1222) Weight:  [100.2 kg] 100.2 kg (07/11 1226)  GENERAL: Awake, alert in NAD HEENT: - Normocephalic and atraumatic, dry mm LUNGS - Clear to auscultation bilaterally with no wheezes CV - S1S2 RRR, no  m/r/g, equal pulses bilaterally. ABDOMEN - Soft, nontender, nondistended with normoactive BS Ext: warm, well perfused, intact peripheral pulses, no edema PSYCH: normal mood and affect   NEURO:  Mental Status: AA&Ox3  Language: speech is normal.  Naming, repetition, fluency, and comprehension intact. Cranial Nerves: PERRL 2 mm/brisk. EOM not intact right gaze preference, visual fields not intact-left hemianopia left facial asymmetry, facial sensation not intact, hearing intact, tongue/uvula/soft palate midline, normal, sternocleidomastoid and trapezius muscle strength. No evidence of tongue atrophy or fibrillations Motor: Tone: is normal and bulk is normal.  Right arm and leg 5/5, left arm and leg 4/5 Sensation- decreased on left   Coordination: ataxia on left Gait- deferred   1a Level of Conscious.:  0 1b LOC Questions:  0  1c LOC Commands:  0 2 Best Gaze: 1 3 Visual: 1  4 Facial Palsy: 1  5a Motor Arm - left: 1  5b Motor Arm - Right: 0 6a Motor Leg - Left: 1 6b Motor Leg - Right: 0  7 Limb Ataxia: 1 8 Sensory: 2 9 Best Language: 0 10 Dysarthria: 0 11 Extinct. and Inatten.: 2 TOTAL: 10   Labs I have reviewed labs in epic and the results pertinent to this consultation are:   CBC    Component Value Date/Time   WBC 11.8 (H) 06/26/2019 1155   RBC 4.93 06/26/2019 1155   HGB 15.6 06/26/2019 1159   HGB 13.7 10/07/2018 1138   HCT 46.0 06/26/2019 1159   HCT 41.6 10/07/2018 1138   PLT 133 (L) 06/26/2019 1155   PLT 195 10/07/2018 1138   MCV 92.9 06/26/2019 1155   MCV 87 10/07/2018 1138   MCH 29.2 06/26/2019 1155   MCHC 31.4 06/26/2019 1155   RDW 14.2 06/26/2019 1155   RDW 13.6 10/07/2018 1138   LYMPHSABS 2.4 06/26/2019 1155   LYMPHSABS 2.7 10/07/2018 1138   MONOABS 1.0 06/26/2019 1155   EOSABS 0.1 06/26/2019 1155   EOSABS 0.2 10/07/2018 1138   BASOSABS 0.0 06/26/2019 1155   BASOSABS 0.1 10/07/2018 1138    CMP     Component Value Date/Time   NA 138 06/26/2019 1159   NA 138 10/07/2018 1138   K 3.6 06/26/2019 1159   CL 104 06/26/2019 1159   CO2 19 (L) 06/26/2019 1155   GLUCOSE 132 (H) 06/26/2019 1159   BUN 20 06/26/2019 1159   BUN 23 10/07/2018 1138   CREATININE 0.70 06/26/2019 1159   CREATININE 0.95 10/06/2017 1208   CALCIUM 9.5 06/26/2019 1155   PROT 7.3 06/26/2019 1155   PROT 7.1 10/07/2018 1138   ALBUMIN 3.3 (L) 06/26/2019 1155   ALBUMIN 4.6 10/07/2018 1138   AST 24 06/26/2019 1155   ALT 23 06/26/2019 1155   ALKPHOS 42 06/26/2019 1155   BILITOT 1.0 06/26/2019 1155   BILITOT 0.3 10/07/2018 1138   GFRNONAA >60 06/26/2019 1155   GFRNONAA 76 10/06/2017 1208   GFRAA >60 06/26/2019 1155   GFRAA 89 10/06/2017 1208    Lipid Panel     Component Value Date/Time   CHOL 184  10/07/2018 1138   TRIG 114 10/07/2018 1138   HDL 41 10/07/2018 1138   CHOLHDL 4.5 10/07/2018 1138   CHOLHDL 4.4 10/06/2017 1208   LDLCALC 120 (H) 10/07/2018 1138   LDLCALC 119 (H) 10/06/2017 1208     Imaging I have reviewed the images obtained:  CT-scan of the brain reveals large right MCA infarct, ASPECTS 5 CTA head/neck reveals no LVO, atherosclerosis in right MCA branch, aortic arch,  carotid bifurcations, and cavernous internal carotid arteries bilaterally without significant stenosis.  Assessment:  Travis Salazar is an 83 year old male with past medical history of HTN, and HLD who presents to [facility] ED for left side weakness and neglect per EMS.  Per EMS LKN at 1800 on 06/25/2019 and was noted to have left side weakness, neglect and inability to swallow his his breakfast this am by his son. He will need to be admitted for a complete stroke workup Cerebral infarction due to embolism of right middle cerebral artery  Recommendations: -Admit to hospitalist -Obtain EKG - HgbA1c, fasting lipid panel -atorvastatin 40mg   If passes swallow screen  - Maintain SBP<180 - Maintain normoglycemia - MRI, MRA  of the brain without contrast - Frequent neuro checks and NIHSS  - Echocardiogram - Carotid dopplers - Prophylactic therapy-Antiplatelet med: Aspirin - dose 325mg  PO or 300mg  PR - Risk factor modification - Telemetry monitoring - PT consult, OT consult, Speech consult - Stroke team to follow  Beulah Gandy, NP

## 2019-06-26 NOTE — H&P (Signed)
History and Physical    [employer]. GGY:[id number] DOB: February 01, 1936 DOA: 06/26/2019  PCP: [name], MD   Patient coming from: Home.  Chief Complaint: Acute left-sided weakness.  HPI: Travis Salazar. is a 83 y.o. male with medical history significant of dyslipidemia and hypertension.  Most history was obtained from his family.  Apparently yesterday during the day he had chest pain and difficulty helping his wife at home.  When his daughter came to check on him he was found on the couch and very somnolent it was around 3 AM the morning.  Around 8 AM he woke up and he was noted to have significant difficulty using his left hand, and dropping objects.  He was unable to stand, and he had a questionable facial droop.  Due to these symptoms he was brought into the hospital for further evaluation.  Patient himself on the requestioning did not notice anything different, most of the signs were noticed by his family.  Apparently patient and his wife had viral upper respiratory tract symptoms for couple weeks  ED Course: Patient was noted to have a left-sided weakness, initial work-up with CT head showed acute right MCA territory infarct.  He was referred for admission for further evaluation.  Review of Systems:  1. General: No fevers, no chills, no weight gain or weight loss 2. ENT: positive runny nose , no sore throat, no hearing disturbances 3. Pulmonary: No dyspnea, cough, wheezing, or hemoptysis 4. Cardiovascular: No angina, claudication, lower extremity edema, pnd or orthopnea 5. Gastrointestinal: No nausea or vomiting, no diarrhea or constipation 6. Hematology: No easy bruisability or frequent infections 7. Urology: No dysuria, hematuria or increased urinary frequency 8. Dermatology: No rashes. 9. Neurology: No seizures or paresthesias. Left sided weakness and mentioned in HPI. 10. Musculoskeletal: No joint pain or deformities  Past Medical History:  Diagnosis Date    Hyperlipidemia    Hypertension     Past Surgical History:  Procedure Laterality Date   LIPOMA EXCISION     neck   PROSTATECTOMY     due to prostate cancer     reports that he quit smoking about 9 years ago. He has a 50.00 pack-year smoking history. He has never used smokeless tobacco. He reports that he does not drink alcohol or use drugs.  No Known Allergies  Family History  Problem Relation Age of Onset   COPD Mother    Heart attack Father      Prior to Admission medications   Medication Sig Start Date End Date Taking? Authorizing Provider  allopurinol (ZYLOPRIM) 300 MG tablet TAKE 1 TABLET BY MOUTH EVERY DAY 06/16/19   [name], MD  amLODipine (NORVASC) 10 MG tablet TAKE 1 TABLET BY MOUTH EVERY DAY 10/13/18   [name], MD  aspirin 325 MG tablet Take 325 mg by mouth daily.  12/03/11   [provider]  atorvastatin (LIPITOR) 10 MG tablet Take 1 tablet (10 mg total) by mouth at bedtime. 11/05/18   [name], MD  colchicine 0.6 MG tablet Take 1 tablet (0.6 mg total) by mouth daily. Patient taking differently: Take 0.6 mg by mouth daily. As needed 05/01/17   [name], MD  lisinopril (PRINIVIL,ZESTRIL) 40 MG tablet TAKE 1 TABLET BY MOUTH EVERY DAY 12/25/18   [name], MD  predniSONE (STERAPRED UNI-PAK 48 TAB) 10 MG (48) TBPK tablet Taper for 12 days as prescribed. Patient not taking: Reported  on 11/11/2018 06/03/17   [name], MD    Physical Exam: Vitals:   06/26/19 1222 06/26/19 1226 06/26/19 1232  BP: 133/73    Pulse: (!) 122    Resp: 18    Temp:   98.5 F (36.9 C)  TempSrc: Oral  Oral  SpO2: 92%    Weight:  100.2 kg   Height:  5\' 9"  (1.753 m)     Vitals:   06/26/19 1222 06/26/19 1226 06/26/19 1232  BP: 133/73    Pulse: (!) 122    Resp: 18    Temp:   98.5 F (36.9 C)  TempSrc: Oral  Oral  SpO2: 92%    Weight:  100.2 kg   Height:  5\' 9"  (1.753 m)    General:  deconditioned and ill looking appearing  Neurology: Awake and alert, 0-1 hand grip on left hand, proximal strength bilateral seems to be preserved 5/5, right hand grip in 5/5, lower extremities are 5/5.  Head and Neck. Head normocephalic. Neck supple with no adenopathy or thyromegaly.   E ENT: mild pallor, no icterus, oral mucosa moist Cardiovascular: No JVD. S1-S2 present, rhythmic, no gallops, rubs, or murmurs. No lower extremity edema. Pulmonary: positive breath sounds bilaterally, adequate air movement, no wheezing, rhonchi or rales. Gastrointestinal. Abdomen with no organomegaly, non tender, no rebound or guarding Skin. No rashes Musculoskeletal: no joint deformities    Labs on Admission: I have personally reviewed following labs and imaging studies  CBC: Recent Labs  Lab 06/26/19 1155 06/26/19 1159  WBC 11.8*  --   NEUTROABS 8.2*  --   HGB 14.4 15.6  HCT 45.8 46.0  MCV 92.9  --   PLT 133*  --    Basic Metabolic Panel: Recent Labs  Lab 06/26/19 1155 06/26/19 1159  NA 138 138  K 3.6 3.6  CL 103 104  CO2 19*  --   GLUCOSE 132* 132*  BUN 19 20  CREATININE 0.90 0.70  CALCIUM 9.5  --    GFR: Estimated Creatinine Clearance: 85.9 mL/min (by C-G formula based on SCr of 0.7 mg/dL). Liver Function Tests: Recent Labs  Lab 06/26/19 1155  AST 24  ALT 23  ALKPHOS 42  BILITOT 1.0  PROT 7.3  ALBUMIN 3.3*   No results for input(s): LIPASE, AMYLASE in the last 168 hours. No results for input(s): AMMONIA in the last 168 hours. Coagulation Profile: Recent Labs  Lab 06/26/19 1155  INR 1.4*   Cardiac Enzymes: No results for input(s): CKTOTAL, CKMB, CKMBINDEX, TROPONINI in the last 168 hours. BNP (last 3 results) No results for input(s): PROBNP in the last 8760 hours. HbA1C: No results for input(s): HGBA1C in the last 72 hours. CBG: Recent Labs  Lab 06/26/19 1154  GLUCAP 129*   Lipid Profile: No results for input(s): CHOL, HDL, LDLCALC, TRIG, CHOLHDL, LDLDIRECT  in the last 72 hours. Thyroid Function Tests: No results for input(s): TSH, T4TOTAL, FREET4, T3FREE, THYROIDAB in the last 72 hours. Anemia Panel: No results for input(s): VITAMINB12, FOLATE, FERRITIN, TIBC, IRON, RETICCTPCT in the last 72 hours. Urine analysis: No results found for: COLORURINE, APPEARANCEUR, [place], [place], GLUCOSEU, HGBUR, BILIRUBINUR, KETONESUR, PROTEINUR, UROBILINOGEN, NITRITE, LEUKOCYTESUR  Radiological Exams on Admission: Ct Angio Head W Or Wo Contrast  Result Date: 06/26/2019 CLINICAL DATA:  Acute onset of left-sided weakness. Abnormal CT of the head. EXAM: CT ANGIOGRAPHY HEAD AND NECK CT PERFUSION BRAIN TECHNIQUE: Multidetector CT imaging of the head and neck was performed using the standard protocol  during bolus administration of intravenous contrast. Multiplanar CT image reconstructions and MIPs were obtained to evaluate the vascular anatomy. Carotid stenosis measurements (when applicable) are obtained utilizing NASCET criteria, using the distal internal carotid diameter as the denominator. Multiphase CT imaging of the brain was performed following IV bolus contrast injection. Subsequent parametric perfusion maps were calculated using RAPID software. CONTRAST:  44mL OMNIPAQUE IOHEXOL 350 MG/ML SOLN COMPARISON:  CT head without contrast 06/26/2019 FINDINGS: CTA NECK FINDINGS Aortic arch: Atherosclerotic calcifications are present at the aortic arch. There is no aneurysm. No significant stenosis is present at the origins the great vessels. A standard three-vessel arch configuration is present. Right carotid system: There is some tortuosity of the cervical right ICA without a significant stenosis. Atherosclerotic changes are noted at the right carotid bifurcation without a significant stenosis. There is tortuosity and additional mural calcifications in the distal cervical right ICA without significant stenosis. Left carotid system: The left common carotid artery within normal  limits. There is artifact the thoracic inlet. Mild atherosclerotic changes are present in the distal cervical left common carotid artery without a significant luminal stenosis relative to the more distal vessel. Atherosclerotic changes are present at the left carotid bifurcation and proximal left ICA without a significant stenosis relative to the more distal vessel. There is tortuosity and additional distal mural calcification without significant stenosis. Vertebral arteries: The vertebral arteries originate from the subclavian arteries bilaterally. Atherosclerotic calcifications are present bilaterally. A 60-70% stenosis is present at the origin of the left vertebral artery, the dominant vessel. There is focal stenosis of the right vertebral artery at the C6 level as the vessel enters the spinal canal. Additional segmental narrowing is present throughout the right vertebral artery in the neck. Other significant stenoses are present in the left vertebral artery in the neck. Skeleton: Multilevel degenerative changes present throughout the cervical spine. There straightening of cervical lordosis. Endplate changes and uncovertebral spurring is most evident at C3-4, C5-6, and C6-7. No focal lytic or blastic lesions are present. Other neck: A calcified nodule in the right lobe of the thyroid measures 10 mm. No other focal thyroid lesions are present. No focal mucosal or submucosal lesions are present. Salivary glands are normal. No significant adenopathy is present. Upper chest: A pleural based nodule in the right upper lobe measures 6 mm. Airspace opacification is noted peripherally in the upper lobes bilaterally. No other discrete nodule is present. Review of the MIP images confirms the above findings CTA HEAD FINDINGS Anterior circulation: Atherosclerotic calcifications are present within the cavernous right internal carotid artery. There is no significant stenosis relative to the more distal vessel. Similar  calcifications are present in the cavernous left internal carotid artery without a significant stenosis relative to the distal vessel. The ICA termini are within normal limits bilaterally. The A1 segments are normal. There is an early bifurcation the right M1 segment. There is mild narrowing of the proximal M2 segments bilaterally. Diffuse asymmetric attenuation right MCA branch vessels is present without a significant proximal stenosis or occlusion Posterior circulation: The left vertebral artery is the dominant vessel. There is moderate narrowing of the right V4 segment without occlusion. Vertebrobasilar junction is within normal limits. The basilar artery is small. There is a moderate proximal stenosis in the mid basilar artery. Left posterior cerebral artery originates from basilar tip. The right posterior cerebral artery is of fetal type. There is moderate attenuation of distal PCA branch vessels bilaterally without a significant proximal stenosis or occlusion. Venous sinuses: The dural  sinuses are patent. The right transverse sinus is dominant. Straight sinus deep cerebral veins are intact. Cortical veins are unremarkable. Anatomic variants: Fetal type right posterior cerebral artery. Review of the MIP images confirms the above findings CT Brain Perfusion Findings: ASPECTS: 5/10 CBF (<30%) Volume: 54mL Perfusion (Tmax>6.0s) volume: 7mL Mismatch Volume: 39mL Infarction Location:Right MCA territory IMPRESSION: 1. CT perfusion results are discordant from the noncontrast head CT. This likely represents pseudo normalization. Noncontrast head CT demonstrates a large right MCA territory infarct with an aspects score of 5/10. 2. No emergent large vessel occlusion. 3. Asymmetric atherosclerotic changes in the right MCA branch vessels including proximal M2 segments corresponding to the infarct territory. 4. Atherosclerotic changes involving the aortic arch, carotid bifurcations, and cavernous internal carotid arteries  bilaterally without significant stenosis. 5. Multilevel degenerative changes the cervical spine. 6. 6 mm pleural-based right upper lobe pulmonary nodule. Additional airspace opacification is present laterally in the upper lobes bilaterally. This may be related to fibrotic change. Follow-up non-contrast CT recommended at 3-6 months to confirm persistence. If unchanged, and solid component remains <6 mm, annual CT is recommended until 5 years of stability has been established. If persistent these nodules should be considered highly suspicious if the solid component of the nodule is 6 mm or greater in size and enlarging. This recommendation follows the consensus statement: Guidelines for Management of Incidental Pulmonary Nodules Detected on CT Images: From the Fleischner Society 2017; Radiology 2017; 284:228-243. Electronically Signed   By: San Morelle M.D.   On: 06/26/2019 12:58   Ct Angio Neck W Or Wo Contrast  Result Date: 06/26/2019 CLINICAL DATA:  Acute onset of left-sided weakness. Abnormal CT of the head. EXAM: CT ANGIOGRAPHY HEAD AND NECK CT PERFUSION BRAIN TECHNIQUE: Multidetector CT imaging of the head and neck was performed using the standard protocol during bolus administration of intravenous contrast. Multiplanar CT image reconstructions and MIPs were obtained to evaluate the vascular anatomy. Carotid stenosis measurements (when applicable) are obtained utilizing NASCET criteria, using the distal internal carotid diameter as the denominator. Multiphase CT imaging of the brain was performed following IV bolus contrast injection. Subsequent parametric perfusion maps were calculated using RAPID software. CONTRAST:  68mL OMNIPAQUE IOHEXOL 350 MG/ML SOLN COMPARISON:  CT head without contrast 06/26/2019 FINDINGS: CTA NECK FINDINGS Aortic arch: Atherosclerotic calcifications are present at the aortic arch. There is no aneurysm. No significant stenosis is present at the origins the great vessels. A  standard three-vessel arch configuration is present. Right carotid system: There is some tortuosity of the cervical right ICA without a significant stenosis. Atherosclerotic changes are noted at the right carotid bifurcation without a significant stenosis. There is tortuosity and additional mural calcifications in the distal cervical right ICA without significant stenosis. Left carotid system: The left common carotid artery within normal limits. There is artifact the thoracic inlet. Mild atherosclerotic changes are present in the distal cervical left common carotid artery without a significant luminal stenosis relative to the more distal vessel. Atherosclerotic changes are present at the left carotid bifurcation and proximal left ICA without a significant stenosis relative to the more distal vessel. There is tortuosity and additional distal mural calcification without significant stenosis. Vertebral arteries: The vertebral arteries originate from the subclavian arteries bilaterally. Atherosclerotic calcifications are present bilaterally. A 60-70% stenosis is present at the origin of the left vertebral artery, the dominant vessel. There is focal stenosis of the right vertebral artery at the C6 level as the vessel enters the spinal canal. Additional segmental narrowing  is present throughout the right vertebral artery in the neck. Other significant stenoses are present in the left vertebral artery in the neck. Skeleton: Multilevel degenerative changes present throughout the cervical spine. There straightening of cervical lordosis. Endplate changes and uncovertebral spurring is most evident at C3-4, C5-6, and C6-7. No focal lytic or blastic lesions are present. Other neck: A calcified nodule in the right lobe of the thyroid measures 10 mm. No other focal thyroid lesions are present. No focal mucosal or submucosal lesions are present. Salivary glands are normal. No significant adenopathy is present. Upper chest: A  pleural based nodule in the right upper lobe measures 6 mm. Airspace opacification is noted peripherally in the upper lobes bilaterally. No other discrete nodule is present. Review of the MIP images confirms the above findings CTA HEAD FINDINGS Anterior circulation: Atherosclerotic calcifications are present within the cavernous right internal carotid artery. There is no significant stenosis relative to the more distal vessel. Similar calcifications are present in the cavernous left internal carotid artery without a significant stenosis relative to the distal vessel. The ICA termini are within normal limits bilaterally. The A1 segments are normal. There is an early bifurcation the right M1 segment. There is mild narrowing of the proximal M2 segments bilaterally. Diffuse asymmetric attenuation right MCA branch vessels is present without a significant proximal stenosis or occlusion Posterior circulation: The left vertebral artery is the dominant vessel. There is moderate narrowing of the right V4 segment without occlusion. Vertebrobasilar junction is within normal limits. The basilar artery is small. There is a moderate proximal stenosis in the mid basilar artery. Left posterior cerebral artery originates from basilar tip. The right posterior cerebral artery is of fetal type. There is moderate attenuation of distal PCA branch vessels bilaterally without a significant proximal stenosis or occlusion. Venous sinuses: The dural sinuses are patent. The right transverse sinus is dominant. Straight sinus deep cerebral veins are intact. Cortical veins are unremarkable. Anatomic variants: Fetal type right posterior cerebral artery. Review of the MIP images confirms the above findings CT Brain Perfusion Findings: ASPECTS: 5/10 CBF (<30%) Volume: 70mL Perfusion (Tmax>6.0s) volume: 4mL Mismatch Volume: 28mL Infarction Location:Right MCA territory IMPRESSION: 1. CT perfusion results are discordant from the noncontrast head CT.  This likely represents pseudo normalization. Noncontrast head CT demonstrates a large right MCA territory infarct with an aspects score of 5/10. 2. No emergent large vessel occlusion. 3. Asymmetric atherosclerotic changes in the right MCA branch vessels including proximal M2 segments corresponding to the infarct territory. 4. Atherosclerotic changes involving the aortic arch, carotid bifurcations, and cavernous internal carotid arteries bilaterally without significant stenosis. 5. Multilevel degenerative changes the cervical spine. 6. 6 mm pleural-based right upper lobe pulmonary nodule. Additional airspace opacification is present laterally in the upper lobes bilaterally. This may be related to fibrotic change. Follow-up non-contrast CT recommended at 3-6 months to confirm persistence. If unchanged, and solid component remains <6 mm, annual CT is recommended until 5 years of stability has been established. If persistent these nodules should be considered highly suspicious if the solid component of the nodule is 6 mm or greater in size and enlarging. This recommendation follows the consensus statement: Guidelines for Management of Incidental Pulmonary Nodules Detected on CT Images: From the Fleischner Society 2017; Radiology 2017; 284:228-243. Electronically Signed   By: San Morelle M.D.   On: 06/26/2019 12:58   Ct Cerebral Perfusion W Contrast  Result Date: 06/26/2019 CLINICAL DATA:  Acute onset of left-sided weakness. Abnormal CT of the head.  EXAM: CT ANGIOGRAPHY HEAD AND NECK CT PERFUSION BRAIN TECHNIQUE: Multidetector CT imaging of the head and neck was performed using the standard protocol during bolus administration of intravenous contrast. Multiplanar CT image reconstructions and MIPs were obtained to evaluate the vascular anatomy. Carotid stenosis measurements (when applicable) are obtained utilizing NASCET criteria, using the distal internal carotid diameter as the denominator. Multiphase CT  imaging of the brain was performed following IV bolus contrast injection. Subsequent parametric perfusion maps were calculated using RAPID software. CONTRAST:  75mL OMNIPAQUE IOHEXOL 350 MG/ML SOLN COMPARISON:  CT head without contrast 06/26/2019 FINDINGS: CTA NECK FINDINGS Aortic arch: Atherosclerotic calcifications are present at the aortic arch. There is no aneurysm. No significant stenosis is present at the origins the great vessels. A standard three-vessel arch configuration is present. Right carotid system: There is some tortuosity of the cervical right ICA without a significant stenosis. Atherosclerotic changes are noted at the right carotid bifurcation without a significant stenosis. There is tortuosity and additional mural calcifications in the distal cervical right ICA without significant stenosis. Left carotid system: The left common carotid artery within normal limits. There is artifact the thoracic inlet. Mild atherosclerotic changes are present in the distal cervical left common carotid artery without a significant luminal stenosis relative to the more distal vessel. Atherosclerotic changes are present at the left carotid bifurcation and proximal left ICA without a significant stenosis relative to the more distal vessel. There is tortuosity and additional distal mural calcification without significant stenosis. Vertebral arteries: The vertebral arteries originate from the subclavian arteries bilaterally. Atherosclerotic calcifications are present bilaterally. A 60-70% stenosis is present at the origin of the left vertebral artery, the dominant vessel. There is focal stenosis of the right vertebral artery at the C6 level as the vessel enters the spinal canal. Additional segmental narrowing is present throughout the right vertebral artery in the neck. Other significant stenoses are present in the left vertebral artery in the neck. Skeleton: Multilevel degenerative changes present throughout the cervical  spine. There straightening of cervical lordosis. Endplate changes and uncovertebral spurring is most evident at C3-4, C5-6, and C6-7. No focal lytic or blastic lesions are present. Other neck: A calcified nodule in the right lobe of the thyroid measures 10 mm. No other focal thyroid lesions are present. No focal mucosal or submucosal lesions are present. Salivary glands are normal. No significant adenopathy is present. Upper chest: A pleural based nodule in the right upper lobe measures 6 mm. Airspace opacification is noted peripherally in the upper lobes bilaterally. No other discrete nodule is present. Review of the MIP images confirms the above findings CTA HEAD FINDINGS Anterior circulation: Atherosclerotic calcifications are present within the cavernous right internal carotid artery. There is no significant stenosis relative to the more distal vessel. Similar calcifications are present in the cavernous left internal carotid artery without a significant stenosis relative to the distal vessel. The ICA termini are within normal limits bilaterally. The A1 segments are normal. There is an early bifurcation the right M1 segment. There is mild narrowing of the proximal M2 segments bilaterally. Diffuse asymmetric attenuation right MCA branch vessels is present without a significant proximal stenosis or occlusion Posterior circulation: The left vertebral artery is the dominant vessel. There is moderate narrowing of the right V4 segment without occlusion. Vertebrobasilar junction is within normal limits. The basilar artery is small. There is a moderate proximal stenosis in the mid basilar artery. Left posterior cerebral artery originates from basilar tip. The right posterior cerebral artery is  of fetal type. There is moderate attenuation of distal PCA branch vessels bilaterally without a significant proximal stenosis or occlusion. Venous sinuses: The dural sinuses are patent. The right transverse sinus is dominant.  Straight sinus deep cerebral veins are intact. Cortical veins are unremarkable. Anatomic variants: Fetal type right posterior cerebral artery. Review of the MIP images confirms the above findings CT Brain Perfusion Findings: ASPECTS: 5/10 CBF (<30%) Volume: 64mL Perfusion (Tmax>6.0s) volume: 65mL Mismatch Volume: 50mL Infarction Location:Right MCA territory IMPRESSION: 1. CT perfusion results are discordant from the noncontrast head CT. This likely represents pseudo normalization. Noncontrast head CT demonstrates a large right MCA territory infarct with an aspects score of 5/10. 2. No emergent large vessel occlusion. 3. Asymmetric atherosclerotic changes in the right MCA branch vessels including proximal M2 segments corresponding to the infarct territory. 4. Atherosclerotic changes involving the aortic arch, carotid bifurcations, and cavernous internal carotid arteries bilaterally without significant stenosis. 5. Multilevel degenerative changes the cervical spine. 6. 6 mm pleural-based right upper lobe pulmonary nodule. Additional airspace opacification is present laterally in the upper lobes bilaterally. This may be related to fibrotic change. Follow-up non-contrast CT recommended at 3-6 months to confirm persistence. If unchanged, and solid component remains <6 mm, annual CT is recommended until 5 years of stability has been established. If persistent these nodules should be considered highly suspicious if the solid component of the nodule is 6 mm or greater in size and enlarging. This recommendation follows the consensus statement: Guidelines for Management of Incidental Pulmonary Nodules Detected on CT Images: From the Fleischner Society 2017; Radiology 2017; 284:228-243. Electronically Signed   By: San Morelle M.D.   On: 06/26/2019 12:58   Ct Head Code Stroke Wo Contrast  Result Date: 06/26/2019 CLINICAL DATA:  Code stroke.  Acute onset of left-sided weakness. EXAM: CT HEAD WITHOUT CONTRAST  TECHNIQUE: Contiguous axial images were obtained from the base of the skull through the vertex without intravenous contrast. COMPARISON:  None FINDINGS: Brain: Extensive right MCA territory cortical hypoattenuation is present. There is involvement of the anterior right frontal lobe, diffuse involvement of the insular cortex, involving the superior temporal gyrus and right parietal lobe. There is partial involvement of the right lentiform nucleus. Parietal involvement extends into the super ganglionic segments. No hemorrhage is present. There is mild diffuse effacement of the sulci with some mass effect. No significant midline shift is present. Left hemisphere is within normal limits. Brainstem and cerebellum are normal. Vascular: Atherosclerotic calcifications are present within the cavernous internal carotid arteries bilaterally. No significant asymmetric vessel hyperdensity is present. Additional atherosclerotic calcifications are present at the dural margin of the left vertebral artery. Skull: Calvarium is intact. No focal lytic or blastic lesions are present. Sinuses/Orbits: The paranasal sinuses and mastoid air cells are clear. The globes and orbits are within normal limits. ASPECTS [facility] Stroke Program Early CT Score) - Ganglionic level infarction (caudate, lentiform nuclei, internal capsule, insula, M1-M3 cortex): 3/7 - Supraganglionic infarction (M4-M6 cortex): 2/3 Total score (0-10 with 10 being normal): 5/10 IMPRESSION: 1. Large right MCA territory infarct without hemorrhage. 2. Early mass effect with partial effacement the sulci but no significant midline shift. 3. Atherosclerosis 4. ASPECTS is 5/10 These results were called by telephone at the time of interpretation on 06/26/2019 at 12:18 pm to Dr. Virgel Manifold , who verbally acknowledged these results. Electronically Signed   By: San Morelle M.D.   On: 06/26/2019 12:22    EKG: Independently reviewed. NA  Assessment/Plan Principal  Problem:  CVA (cerebral vascular accident) [facility]) Active Problems:   Essential (primary) hypertension   History of tobacco use   Hypercholesterolemia  83 year old male with hypertension, dyslipidemia and tobacco abuse who presents with a new focal neurologic deficit in his left hand.  He has developed almost complete paresis on his left hand, the rest of his strength seems to be preserved.  He is right-handed.  Unclear time of symptom onset.  His blood pressure is 133/73, heart rate 122, respiratory rate 18, oxygen saturation 92% on room air.  His lungs are clear to auscultation, heart S1-S2 present and rhythmic his abdomen is soft, no lower extremity edema.  He does have 0-1 hand grip on his left hand.  Sodium 138, potassium 3.6, chloride 103, bicarb 19, glucose 132, BUN 19, creatinine 0.90, SATS COVID-19 positive.  Head CT with large right MCA territory infarct without hemorrhage.  Early mass-effect with partial effacement of sulci but no significant midline shift.  CT angiography with no emergent large vessel occlusion.  Patient will be admitted to the hospital working diagnosis of acute right MCA territory infarct in the setting of COVID positive.  1.  Acute right MCA territory infarct with early mass-effect/no midline shift.  Patient has a frank focal neurologic deficit in his left hand.  Will admit patient to the medical telemetry unit, neuro checks every 4 hours, further work-up with carotid ultrasonography, echocardiography, lipid profile, physical therapy and neurology evaluation.  For now will place patient on aspirin and atorvastatin.  2.  Hypertension.  His admission blood pressure  is 133/73, will hold antihypertensives to avoid hypotension, allow permissive hypertension in the setting of acute CVA.  3.  Dyslipidemia.  Check lipid profile, high-dose atorvastatin.  4.  Positive coronavirus.  Patient does not have respiratory symptoms, he did have a upper respiratory tract infection  symptoms couple weeks ago per his family.  Continue close oximetry monitoring.  No signs of viral pneumonia.  DVT prophylaxis: Enoxaparin Code Status: full  Family Communication: I spoke with patient's family by phone call and all questions were addressed.   Disposition Plan: medical telemetry   Consults called: neurology   Admission status: Inpatient    Travis Salazar Gerome Apley MD [facility]   06/26/2019, 1:20 PM  \

## 2019-06-26 NOTE — ED Notes (Signed)
Nurse on 2W36 concerned pt. Current BP 127/86. Nurse states preference of higher BP to be able to accept pt. To floor. Floor Nurse made aware Pt's Initial BP 137/73. Dr. Cathlean Sauer to be paged.

## 2019-06-26 NOTE — ED Notes (Signed)
2w36 RN concern no parameters concerning BP Management

## 2019-06-26 NOTE — ED Notes (Addendum)
ED TO INPATIENT HANDOFF REPORT  ED Nurse Name and Phone #:  Vic Esco RN [phone]  S Name/Age/Gender Travis Salazar. 83 y.o. male Room/Bed: 032C/032C  Code Status   Code Status: Not on file  Home/SNF/Other Home Patient oriented to: self, place, time and situation Is this baseline? Yes      Chief Complaint code stroke  Triage Note PT last seen normal on Friday between 1800-1900 by son. Pt lives alone  PT was  Found by Family today.   TC from Pt Daughter . Daughter    Allergies No Known Allergies  Level of Care/Admitting Diagnosis ED Disposition    ED Disposition Condition Comment   Admit  Hospital Area: [place] [[id number]]  Level of Care: Telemetry Medical [104]  Covid Evaluation: N/A  Diagnosis: CVA (cerebral vascular accident) [facility]) [[id number]]  Admitting Physician: Tawni Millers [[id number]]  Attending Physician: Tawni Millers [[id number]]  Estimated length of stay: 3 - 4 days  Certification:: I certify this patient will need inpatient services for at least 2 midnights  PT Class (Do Not Modify): Inpatient [101]  PT Acc Code (Do Not Modify): Private [1]       B Medical/Surgery History Past Medical History:  Diagnosis Date  . Hyperlipidemia   . Hypertension    Past Surgical History:  Procedure Laterality Date  . LIPOMA EXCISION     neck  . PROSTATECTOMY     due to prostate cancer     A IV Location/Drains/Wounds Patient Lines/Drains/Airways Status   Active Line/Drains/Airways    Name:   Placement date:   Placement time:   Site:   Days:   Peripheral IV 06/26/19 Left Antecubital   06/26/19    1151    Antecubital   less than 1          Intake/Output Last 24 hours No intake or output data in the 24 hours ending 06/26/19 1913  Labs/Imaging Results for orders placed or performed during the hospital encounter of 06/26/19 (from the past 48 hour(s))  CBG monitoring, ED     Status: Abnormal   Collection Time:  06/26/19 11:54 AM  Result Value Ref Range   Glucose-Capillary 129 (H) 70 - 99 mg/dL  Protime-INR     Status: Abnormal   Collection Time: 06/26/19 11:55 AM  Result Value Ref Range   Prothrombin Time 16.9 (H) 11.4 - 15.2 seconds   INR 1.4 (H) 0.8 - 1.2    Comment: (NOTE) INR goal varies based on device and disease states. Performed at [facility], [place] [address], [place], [place] [address]   APTT     Status: None   Collection Time: 06/26/19 11:55 AM  Result Value Ref Range   aPTT 33 24 - 36 seconds    Comment: Performed at [facility] [address], [place], [place] [address]  CBC     Status: Abnormal   Collection Time: 06/26/19 11:55 AM  Result Value Ref Range   WBC 11.8 (H) 4.0 - 10.5 K/uL   RBC 4.93 4.22 - 5.81 MIL/uL   Hemoglobin 14.4 13.0 - 17.0 g/dL   HCT 45.8 39.0 - 52.0 %   MCV 92.9 80.0 - 100.0 fL   MCH 29.2 26.0 - 34.0 pg   MCHC 31.4 30.0 - 36.0 g/dL   RDW 14.2 11.5 - 15.5 %   Platelets 133 (L) 150 - 400 K/uL    Comment: Immature Platelet Fraction may be clinically indicated, consider ordering  this additional test [id number]    nRBC 0.0 0.0 - 0.2 %    Comment: Performed at [facility], [place] [address], [place], [place] [address]  Differential     Status: Abnormal   Collection Time: 06/26/19 11:55 AM  Result Value Ref Range   Neutrophils Relative % 68 %   Neutro Abs 8.2 (H) 1.7 - 7.7 K/uL   Lymphocytes Relative 21 %   Lymphs Abs 2.4 0.7 - 4.0 K/uL   Monocytes Relative 9 %   Monocytes Absolute 1.0 0.1 - 1.0 K/uL   Eosinophils Relative 1 %   Eosinophils Absolute 0.1 0.0 - 0.5 K/uL   Basophils Relative 0 %   Basophils Absolute 0.0 0.0 - 0.1 K/uL   Immature Granulocytes 1 %   Abs Immature Granulocytes 0.08 (H) 0.00 - 0.07 K/uL    Comment: Performed at [facility] [address] [place], [place] [address]  Comprehensive metabolic panel     Status: Abnormal   Collection Time: 06/26/19 11:55 AM  Result Value Ref Range   Sodium 138  135 - 145 mmol/L   Potassium 3.6 3.5 - 5.1 mmol/L   Chloride 103 98 - 111 mmol/L   CO2 19 (L) 22 - 32 mmol/L   Glucose, Bld 132 (H) 70 - 99 mg/dL   BUN 19 8 - 23 mg/dL   Creatinine, Ser 0.90 0.61 - 1.24 mg/dL   Calcium 9.5 8.9 - 10.3 mg/dL   Total Protein 7.3 6.5 - 8.1 g/dL   Albumin 3.3 (L) 3.5 - 5.0 g/dL   AST 24 15 - 41 U/L   ALT 23 0 - 44 U/L   Alkaline Phosphatase 42 38 - 126 U/L   Total Bilirubin 1.0 0.3 - 1.2 mg/dL   GFR calc non Af Amer >60 >60 mL/min   GFR calc Af Amer >60 >60 mL/min   Anion gap 16 (H) 5 - 15    Comment: Performed at [facility] [address], [place], [place] [address]  I-stat chem 8, ED     Status: Abnormal   Collection Time: 06/26/19 11:59 AM  Result Value Ref Range   Sodium 138 135 - 145 mmol/L   Potassium 3.6 3.5 - 5.1 mmol/L   Chloride 104 98 - 111 mmol/L   BUN 20 8 - 23 mg/dL   Creatinine, Ser 0.70 0.61 - 1.24 mg/dL   Glucose, Bld 132 (H) 70 - 99 mg/dL   Calcium, Ion 1.15 1.15 - 1.40 mmol/L   TCO2 23 22 - 32 mmol/L   Hemoglobin 15.6 13.0 - 17.0 g/dL   HCT 46.0 39.0 - 52.0 %  SARS Coronavirus 2 (CEPHEID - Performed in [facility] hospital lab), Hosp Order     Status: Abnormal   Collection Time: 06/26/19 12:36 PM   Specimen: Nasopharyngeal Swab  Result Value Ref Range   SARS Coronavirus 2 POSITIVE (A) NEGATIVE    Comment: RESULT CALLED TO, READ BACK BY AND VERIFIED WITH: RN A MCKEOWN [phone] (NOTE) If result is NEGATIVE SARS-CoV-2 target nucleic acids are NOT DETECTED. The SARS-CoV-2 RNA is generally detectable in upper and lower  respiratory specimens during the acute phase of infection. The lowest  concentration of SARS-CoV-2 viral copies this assay can detect is 250  copies / mL. A negative result does not preclude SARS-CoV-2 infection  and should not be used as the sole basis for treatment or other  patient management decisions.  A negative result may  occur with  improper specimen collection / handling, submission of  specimen other  than nasopharyngeal swab, presence of viral mutation(s) within the  areas targeted by this assay, and inadequate number of viral copies  (<250 copies / mL). A negative result must be combined with clinical  observations, patient history, and epidemiological information. If result is POSITIVE SARS-CoV-2 target nucleic acids are DETECTED. The SARS -CoV-2 RNA is generally detectable in upper and lower  respiratory specimens during the acute phase of infection.  Positive  results are indicative of active infection with SARS-CoV-2.  Clinical  correlation with patient history and other diagnostic information is  necessary to determine patient infection status.  Positive results do  not rule out bacterial infection or co-infection with other viruses. If result is PRESUMPTIVE POSTIVE SARS-CoV-2 nucleic acids MAY BE PRESENT.   A presumptive positive result was obtained on the submitted specimen  and confirmed on repeat testing.  While 2019 novel coronavirus  (SARS-CoV-2) nucleic acids may be present in the submitted sample  additional confirmatory testing may be necessary for epidemiological  and / or clinical management purposes  to differentiate between  SARS-CoV-2 and other Sarbecovirus currently known to infect humans.  If clinically indicated additional testing with an alternate test  methodology [phone]) is advise d. The SARS-CoV-2 RNA is generally  detectable in upper and lower respiratory specimens during the acute  phase of infection. The expected result is Negative. Fact Sheet for Patients:  StrictlyIdeas.no Fact Sheet for Healthcare Providers: BankingDealers.co.za This test is not yet approved or cleared by the [place] FDA and has been authorized for detection and/or diagnosis of SARS-CoV-2 by FDA under an Emergency Use Authorization (EUA).  This EUA will remain in effect (meaning this test can be used) for the  duration of the COVID-19 declaration under Section 564(b)(1) of the Act, 21 U.S.C. section 360bbb-3(b)(1), unless the authorization is terminated or revoked sooner. Performed at [facility], [place] [address] [place], [place] [address]    Ct Angio Head W Or Wo Contrast  Result Date: 06/26/2019 CLINICAL DATA:  Acute onset of left-sided weakness. Abnormal CT of the head. EXAM: CT ANGIOGRAPHY HEAD AND NECK CT PERFUSION BRAIN TECHNIQUE: Multidetector CT imaging of the head and neck was performed using the standard protocol during bolus administration of intravenous contrast. Multiplanar CT image reconstructions and MIPs were obtained to evaluate the vascular anatomy. Carotid stenosis measurements (when applicable) are obtained utilizing NASCET criteria, using the distal internal carotid diameter as the denominator. Multiphase CT imaging of the brain was performed following IV bolus contrast injection. Subsequent parametric perfusion maps were calculated using RAPID software. CONTRAST:  50mL OMNIPAQUE IOHEXOL 350 MG/ML SOLN COMPARISON:  CT head without contrast 06/26/2019 FINDINGS: CTA NECK FINDINGS Aortic arch: Atherosclerotic calcifications are present at the aortic arch. There is no aneurysm. No significant stenosis is present at the origins the great vessels. A standard three-vessel arch configuration is present. Right carotid system: There is some tortuosity of the cervical right ICA without a significant stenosis. Atherosclerotic changes are noted at the right carotid bifurcation without a significant stenosis. There is tortuosity and additional mural calcifications in the distal cervical right ICA without significant stenosis. Left carotid system: The left common carotid artery within normal limits. There is artifact the thoracic inlet. Mild atherosclerotic changes are present in the distal cervical left common carotid artery without a significant luminal stenosis relative to the more distal vessel.  Atherosclerotic changes are present at the left carotid bifurcation and  proximal left ICA without a significant stenosis relative to the more distal vessel. There is tortuosity and additional distal mural calcification without significant stenosis. Vertebral arteries: The vertebral arteries originate from the subclavian arteries bilaterally. Atherosclerotic calcifications are present bilaterally. A 60-70% stenosis is present at the origin of the left vertebral artery, the dominant vessel. There is focal stenosis of the right vertebral artery at the C6 level as the vessel enters the spinal canal. Additional segmental narrowing is present throughout the right vertebral artery in the neck. Other significant stenoses are present in the left vertebral artery in the neck. Skeleton: Multilevel degenerative changes present throughout the cervical spine. There straightening of cervical lordosis. Endplate changes and uncovertebral spurring is most evident at C3-4, C5-6, and C6-7. No focal lytic or blastic lesions are present. Other neck: A calcified nodule in the right lobe of the thyroid measures 10 mm. No other focal thyroid lesions are present. No focal mucosal or submucosal lesions are present. Salivary glands are normal. No significant adenopathy is present. Upper chest: A pleural based nodule in the right upper lobe measures 6 mm. Airspace opacification is noted peripherally in the upper lobes bilaterally. No other discrete nodule is present. Review of the MIP images confirms the above findings CTA HEAD FINDINGS Anterior circulation: Atherosclerotic calcifications are present within the cavernous right internal carotid artery. There is no significant stenosis relative to the more distal vessel. Similar calcifications are present in the cavernous left internal carotid artery without a significant stenosis relative to the distal vessel. The ICA termini are within normal limits bilaterally. The A1 segments are normal.  There is an early bifurcation the right M1 segment. There is mild narrowing of the proximal M2 segments bilaterally. Diffuse asymmetric attenuation right MCA branch vessels is present without a significant proximal stenosis or occlusion Posterior circulation: The left vertebral artery is the dominant vessel. There is moderate narrowing of the right V4 segment without occlusion. Vertebrobasilar junction is within normal limits. The basilar artery is small. There is a moderate proximal stenosis in the mid basilar artery. Left posterior cerebral artery originates from basilar tip. The right posterior cerebral artery is of fetal type. There is moderate attenuation of distal PCA branch vessels bilaterally without a significant proximal stenosis or occlusion. Venous sinuses: The dural sinuses are patent. The right transverse sinus is dominant. Straight sinus deep cerebral veins are intact. Cortical veins are unremarkable. Anatomic variants: Fetal type right posterior cerebral artery. Review of the MIP images confirms the above findings CT Brain Perfusion Findings: ASPECTS: 5/10 CBF (<30%) Volume: 42mL Perfusion (Tmax>6.0s) volume: 87mL Mismatch Volume: 2mL Infarction Location:Right MCA territory IMPRESSION: 1. CT perfusion results are discordant from the noncontrast head CT. This likely represents pseudo normalization. Noncontrast head CT demonstrates a large right MCA territory infarct with an aspects score of 5/10. 2. No emergent large vessel occlusion. 3. Asymmetric atherosclerotic changes in the right MCA branch vessels including proximal M2 segments corresponding to the infarct territory. 4. Atherosclerotic changes involving the aortic arch, carotid bifurcations, and cavernous internal carotid arteries bilaterally without significant stenosis. 5. Multilevel degenerative changes the cervical spine. 6. 6 mm pleural-based right upper lobe pulmonary nodule. Additional airspace opacification is present laterally in the  upper lobes bilaterally. This may be related to fibrotic change. Follow-up non-contrast CT recommended at 3-6 months to confirm persistence. If unchanged, and solid component remains <6 mm, annual CT is recommended until 5 years of stability has been established. If persistent these nodules should be considered highly suspicious  if the solid component of the nodule is 6 mm or greater in size and enlarging. This recommendation follows the consensus statement: Guidelines for Management of Incidental Pulmonary Nodules Detected on CT Images: From the Fleischner Society 2017; Radiology 2017; 284:228-243. Electronically Signed   By: San Morelle M.D.   On: 06/26/2019 12:58   Ct Angio Neck W Or Wo Contrast  Result Date: 06/26/2019 CLINICAL DATA:  Acute onset of left-sided weakness. Abnormal CT of the head. EXAM: CT ANGIOGRAPHY HEAD AND NECK CT PERFUSION BRAIN TECHNIQUE: Multidetector CT imaging of the head and neck was performed using the standard protocol during bolus administration of intravenous contrast. Multiplanar CT image reconstructions and MIPs were obtained to evaluate the vascular anatomy. Carotid stenosis measurements (when applicable) are obtained utilizing NASCET criteria, using the distal internal carotid diameter as the denominator. Multiphase CT imaging of the brain was performed following IV bolus contrast injection. Subsequent parametric perfusion maps were calculated using RAPID software. CONTRAST:  78mL OMNIPAQUE IOHEXOL 350 MG/ML SOLN COMPARISON:  CT head without contrast 06/26/2019 FINDINGS: CTA NECK FINDINGS Aortic arch: Atherosclerotic calcifications are present at the aortic arch. There is no aneurysm. No significant stenosis is present at the origins the great vessels. A standard three-vessel arch configuration is present. Right carotid system: There is some tortuosity of the cervical right ICA without a significant stenosis. Atherosclerotic changes are noted at the right carotid  bifurcation without a significant stenosis. There is tortuosity and additional mural calcifications in the distal cervical right ICA without significant stenosis. Left carotid system: The left common carotid artery within normal limits. There is artifact the thoracic inlet. Mild atherosclerotic changes are present in the distal cervical left common carotid artery without a significant luminal stenosis relative to the more distal vessel. Atherosclerotic changes are present at the left carotid bifurcation and proximal left ICA without a significant stenosis relative to the more distal vessel. There is tortuosity and additional distal mural calcification without significant stenosis. Vertebral arteries: The vertebral arteries originate from the subclavian arteries bilaterally. Atherosclerotic calcifications are present bilaterally. A 60-70% stenosis is present at the origin of the left vertebral artery, the dominant vessel. There is focal stenosis of the right vertebral artery at the C6 level as the vessel enters the spinal canal. Additional segmental narrowing is present throughout the right vertebral artery in the neck. Other significant stenoses are present in the left vertebral artery in the neck. Skeleton: Multilevel degenerative changes present throughout the cervical spine. There straightening of cervical lordosis. Endplate changes and uncovertebral spurring is most evident at C3-4, C5-6, and C6-7. No focal lytic or blastic lesions are present. Other neck: A calcified nodule in the right lobe of the thyroid measures 10 mm. No other focal thyroid lesions are present. No focal mucosal or submucosal lesions are present. Salivary glands are normal. No significant adenopathy is present. Upper chest: A pleural based nodule in the right upper lobe measures 6 mm. Airspace opacification is noted peripherally in the upper lobes bilaterally. No other discrete nodule is present. Review of the MIP images confirms the above  findings CTA HEAD FINDINGS Anterior circulation: Atherosclerotic calcifications are present within the cavernous right internal carotid artery. There is no significant stenosis relative to the more distal vessel. Similar calcifications are present in the cavernous left internal carotid artery without a significant stenosis relative to the distal vessel. The ICA termini are within normal limits bilaterally. The A1 segments are normal. There is an early bifurcation the right M1 segment. There is  mild narrowing of the proximal M2 segments bilaterally. Diffuse asymmetric attenuation right MCA branch vessels is present without a significant proximal stenosis or occlusion Posterior circulation: The left vertebral artery is the dominant vessel. There is moderate narrowing of the right V4 segment without occlusion. Vertebrobasilar junction is within normal limits. The basilar artery is small. There is a moderate proximal stenosis in the mid basilar artery. Left posterior cerebral artery originates from basilar tip. The right posterior cerebral artery is of fetal type. There is moderate attenuation of distal PCA branch vessels bilaterally without a significant proximal stenosis or occlusion. Venous sinuses: The dural sinuses are patent. The right transverse sinus is dominant. Straight sinus deep cerebral veins are intact. Cortical veins are unremarkable. Anatomic variants: Fetal type right posterior cerebral artery. Review of the MIP images confirms the above findings CT Brain Perfusion Findings: ASPECTS: 5/10 CBF (<30%) Volume: 2mL Perfusion (Tmax>6.0s) volume: 57mL Mismatch Volume: 64mL Infarction Location:Right MCA territory IMPRESSION: 1. CT perfusion results are discordant from the noncontrast head CT. This likely represents pseudo normalization. Noncontrast head CT demonstrates a large right MCA territory infarct with an aspects score of 5/10. 2. No emergent large vessel occlusion. 3. Asymmetric atherosclerotic changes  in the right MCA branch vessels including proximal M2 segments corresponding to the infarct territory. 4. Atherosclerotic changes involving the aortic arch, carotid bifurcations, and cavernous internal carotid arteries bilaterally without significant stenosis. 5. Multilevel degenerative changes the cervical spine. 6. 6 mm pleural-based right upper lobe pulmonary nodule. Additional airspace opacification is present laterally in the upper lobes bilaterally. This may be related to fibrotic change. Follow-up non-contrast CT recommended at 3-6 months to confirm persistence. If unchanged, and solid component remains <6 mm, annual CT is recommended until 5 years of stability has been established. If persistent these nodules should be considered highly suspicious if the solid component of the nodule is 6 mm or greater in size and enlarging. This recommendation follows the consensus statement: Guidelines for Management of Incidental Pulmonary Nodules Detected on CT Images: From the Fleischner Society 2017; Radiology 2017; 284:228-243. Electronically Signed   By: San Morelle M.D.   On: 06/26/2019 12:58   Ct Cerebral Perfusion W Contrast  Result Date: 06/26/2019 CLINICAL DATA:  Acute onset of left-sided weakness. Abnormal CT of the head. EXAM: CT ANGIOGRAPHY HEAD AND NECK CT PERFUSION BRAIN TECHNIQUE: Multidetector CT imaging of the head and neck was performed using the standard protocol during bolus administration of intravenous contrast. Multiplanar CT image reconstructions and MIPs were obtained to evaluate the vascular anatomy. Carotid stenosis measurements (when applicable) are obtained utilizing NASCET criteria, using the distal internal carotid diameter as the denominator. Multiphase CT imaging of the brain was performed following IV bolus contrast injection. Subsequent parametric perfusion maps were calculated using RAPID software. CONTRAST:  10mL OMNIPAQUE IOHEXOL 350 MG/ML SOLN COMPARISON:  CT head  without contrast 06/26/2019 FINDINGS: CTA NECK FINDINGS Aortic arch: Atherosclerotic calcifications are present at the aortic arch. There is no aneurysm. No significant stenosis is present at the origins the great vessels. A standard three-vessel arch configuration is present. Right carotid system: There is some tortuosity of the cervical right ICA without a significant stenosis. Atherosclerotic changes are noted at the right carotid bifurcation without a significant stenosis. There is tortuosity and additional mural calcifications in the distal cervical right ICA without significant stenosis. Left carotid system: The left common carotid artery within normal limits. There is artifact the thoracic inlet. Mild atherosclerotic changes are present in the distal cervical left  common carotid artery without a significant luminal stenosis relative to the more distal vessel. Atherosclerotic changes are present at the left carotid bifurcation and proximal left ICA without a significant stenosis relative to the more distal vessel. There is tortuosity and additional distal mural calcification without significant stenosis. Vertebral arteries: The vertebral arteries originate from the subclavian arteries bilaterally. Atherosclerotic calcifications are present bilaterally. A 60-70% stenosis is present at the origin of the left vertebral artery, the dominant vessel. There is focal stenosis of the right vertebral artery at the C6 level as the vessel enters the spinal canal. Additional segmental narrowing is present throughout the right vertebral artery in the neck. Other significant stenoses are present in the left vertebral artery in the neck. Skeleton: Multilevel degenerative changes present throughout the cervical spine. There straightening of cervical lordosis. Endplate changes and uncovertebral spurring is most evident at C3-4, C5-6, and C6-7. No focal lytic or blastic lesions are present. Other neck: A calcified nodule in the  right lobe of the thyroid measures 10 mm. No other focal thyroid lesions are present. No focal mucosal or submucosal lesions are present. Salivary glands are normal. No significant adenopathy is present. Upper chest: A pleural based nodule in the right upper lobe measures 6 mm. Airspace opacification is noted peripherally in the upper lobes bilaterally. No other discrete nodule is present. Review of the MIP images confirms the above findings CTA HEAD FINDINGS Anterior circulation: Atherosclerotic calcifications are present within the cavernous right internal carotid artery. There is no significant stenosis relative to the more distal vessel. Similar calcifications are present in the cavernous left internal carotid artery without a significant stenosis relative to the distal vessel. The ICA termini are within normal limits bilaterally. The A1 segments are normal. There is an early bifurcation the right M1 segment. There is mild narrowing of the proximal M2 segments bilaterally. Diffuse asymmetric attenuation right MCA branch vessels is present without a significant proximal stenosis or occlusion Posterior circulation: The left vertebral artery is the dominant vessel. There is moderate narrowing of the right V4 segment without occlusion. Vertebrobasilar junction is within normal limits. The basilar artery is small. There is a moderate proximal stenosis in the mid basilar artery. Left posterior cerebral artery originates from basilar tip. The right posterior cerebral artery is of fetal type. There is moderate attenuation of distal PCA branch vessels bilaterally without a significant proximal stenosis or occlusion. Venous sinuses: The dural sinuses are patent. The right transverse sinus is dominant. Straight sinus deep cerebral veins are intact. Cortical veins are unremarkable. Anatomic variants: Fetal type right posterior cerebral artery. Review of the MIP images confirms the above findings CT Brain Perfusion Findings:  ASPECTS: 5/10 CBF (<30%) Volume: 51mL Perfusion (Tmax>6.0s) volume: 39mL Mismatch Volume: 20mL Infarction Location:Right MCA territory IMPRESSION: 1. CT perfusion results are discordant from the noncontrast head CT. This likely represents pseudo normalization. Noncontrast head CT demonstrates a large right MCA territory infarct with an aspects score of 5/10. 2. No emergent large vessel occlusion. 3. Asymmetric atherosclerotic changes in the right MCA branch vessels including proximal M2 segments corresponding to the infarct territory. 4. Atherosclerotic changes involving the aortic arch, carotid bifurcations, and cavernous internal carotid arteries bilaterally without significant stenosis. 5. Multilevel degenerative changes the cervical spine. 6. 6 mm pleural-based right upper lobe pulmonary nodule. Additional airspace opacification is present laterally in the upper lobes bilaterally. This may be related to fibrotic change. Follow-up non-contrast CT recommended at 3-6 months to confirm persistence. If unchanged, and solid component  remains <6 mm, annual CT is recommended until 5 years of stability has been established. If persistent these nodules should be considered highly suspicious if the solid component of the nodule is 6 mm or greater in size and enlarging. This recommendation follows the consensus statement: Guidelines for Management of Incidental Pulmonary Nodules Detected on CT Images: From the Fleischner Society 2017; Radiology 2017; 284:228-243. Electronically Signed   By: San Morelle M.D.   On: 06/26/2019 12:58   Ct Head Code Stroke Wo Contrast  Result Date: 06/26/2019 CLINICAL DATA:  Code stroke.  Acute onset of left-sided weakness. EXAM: CT HEAD WITHOUT CONTRAST TECHNIQUE: Contiguous axial images were obtained from the base of the skull through the vertex without intravenous contrast. COMPARISON:  None FINDINGS: Brain: Extensive right MCA territory cortical hypoattenuation is present. There  is involvement of the anterior right frontal lobe, diffuse involvement of the insular cortex, involving the superior temporal gyrus and right parietal lobe. There is partial involvement of the right lentiform nucleus. Parietal involvement extends into the super ganglionic segments. No hemorrhage is present. There is mild diffuse effacement of the sulci with some mass effect. No significant midline shift is present. Left hemisphere is within normal limits. Brainstem and cerebellum are normal. Vascular: Atherosclerotic calcifications are present within the cavernous internal carotid arteries bilaterally. No significant asymmetric vessel hyperdensity is present. Additional atherosclerotic calcifications are present at the dural margin of the left vertebral artery. Skull: Calvarium is intact. No focal lytic or blastic lesions are present. Sinuses/Orbits: The paranasal sinuses and mastoid air cells are clear. The globes and orbits are within normal limits. ASPECTS [facility] Stroke Program Early CT Score) - Ganglionic level infarction (caudate, lentiform nuclei, internal capsule, insula, M1-M3 cortex): 3/7 - Supraganglionic infarction (M4-M6 cortex): 2/3 Total score (0-10 with 10 being normal): 5/10 IMPRESSION: 1. Large right MCA territory infarct without hemorrhage. 2. Early mass effect with partial effacement the sulci but no significant midline shift. 3. Atherosclerosis 4. ASPECTS is 5/10 These results were called by telephone at the time of interpretation on 06/26/2019 at 12:18 pm to Dr. Virgel Manifold , who verbally acknowledged these results. Electronically Signed   By: San Morelle M.D.   On: 06/26/2019 12:22    Pending Labs Unresulted Labs (From admission, onward)    Start     Ordered   06/27/19 0500  Hemoglobin A1c  Tomorrow morning,   R     06/26/19 1327   06/27/19 0500  Lipid panel  Tomorrow morning,   R    Comments: Fasting    06/26/19 1327   Signed and Held  CBC  (enoxaparin (LOVENOX)    CrCl  >/= 30 ml/min)  Once,   R    Comments: Baseline for enoxaparin therapy IF NOT ALREADY DRAWN.  Notify MD if PLT < 100 K.    Signed and Held   Signed and Held  Creatinine, serum  (enoxaparin (LOVENOX)    CrCl >/= 30 ml/min)  Once,   R    Comments: Baseline for enoxaparin therapy IF NOT ALREADY DRAWN.    Signed and Held   Signed and Held  Creatinine, serum  (enoxaparin (LOVENOX)    CrCl >/= 30 ml/min)  Weekly,   R    Comments: while on enoxaparin therapy    Signed and Held   Signed and Held  Basic metabolic panel  Tomorrow morning,   R     Signed and Held   Signed and Held  CBC  Tomorrow morning,  R     Signed and Held   Signed and Held  Hemoglobin A1c  Tomorrow morning,   R     Signed and Held   Signed and Held  Lipid panel  Tomorrow morning,   R    Comments: Fasting    Signed and Held          Vitals/Pain Today's Vitals   06/26/19 1630 06/26/19 1745 06/26/19 1800 06/26/19 1812  BP: 133/90 (!) 137/96 131/87   Pulse:      Resp: (!) 21 (!) 22 19   Temp:      TempSrc:      SpO2:      Weight:      Height:      PainSc:    0-No pain    Isolation Precautions No active isolations  Medications Medications  sodium chloride flush (NS) 0.9 % injection 3 mL (has no administration in time range)   stroke: mapping our early stages of recovery book (has no administration in time range)  iohexol (OMNIPAQUE) 350 MG/ML injection 90 mL (90 mLs Intravenous Contrast Given 06/26/19 1208)  aspirin chewable tablet 324 mg (324 mg Oral Given 06/26/19 1604)    Mobility non-ambulatory Low fall risk   Focused Assessments Neuro Assessment Handoff:  Swallow screen pass? Yes    NIH Stroke Scale ( + Modified Stroke Scale Criteria)  Interval: Initial Level of Consciousness (1a.)   : Alert, keenly responsive LOC Questions (1b. )   +: Answers both questions correctly LOC Commands (1c. )   + : Performs both tasks correctly Best Gaze (2. )  +: Partial gaze palsy Visual (3. )  +: Partial  hemianopia Facial Palsy (4. )    : Minor paralysis Motor Arm, Left (5a. )   +: No drift Motor Arm, Right (5b. )   +: No drift Motor Leg, Left (6a. )   +: Drift Motor Leg, Right (6b. )   +: No drift Limb Ataxia (7. ): Present in one limb Sensory (8. )   +: Severe to total sensory loss, patient is not aware of being touched in the face, arm, and leg Best Language (9. )   +: Severe aphasia Dysarthria (10. ): Normal Extinction/Inattention (11.)   +: Profound hemi-inattention or extinction to more than one modality Modified SS Total  +: 9 Complete NIHSS TOTAL: 11 Last date known well: 06/25/19 Last time known well: 1500 Neuro Assessment:   Neuro Checks:   Initial (06/26/19 1151)  Last Documented NIHSS Modified Score: 9 (06/26/19 1751) Has TPA been given? No d/t outside window  If patient is a Neuro Trauma and patient is going to OR before floor call report to [place] nurse: [phone] or [phone]     R Recommendations: See Admitting Provider Note  Report given to:   Additional Notes:  Neglect left side,  Ataxia  Decreased sensation on left side Possible incontinence

## 2019-06-26 NOTE — ED Notes (Signed)
Got patient on the monitor did ekg shown to Dr Chilton Si patient is resting with call bell in reach

## 2019-06-26 NOTE — ED Notes (Signed)
Attempted to call report to RN at [phone]. Handoff report completed to call back 5-10 minutes

## 2019-06-26 NOTE — ED Triage Notes (Signed)
PT was  Found by Family today.

## 2019-06-26 NOTE — ED Notes (Signed)
Secretary to page Dr. Cathlean Sauer

## 2019-06-27 ENCOUNTER — Inpatient Hospital Stay (HOSPITAL_COMMUNITY): Payer: Medicare Other

## 2019-06-27 ENCOUNTER — Encounter (HOSPITAL_COMMUNITY): Payer: Medicare Other

## 2019-06-27 DIAGNOSIS — I6300 Cerebral infarction due to thrombosis of unspecified precerebral artery: Secondary | ICD-10-CM

## 2019-06-27 DIAGNOSIS — E785 Hyperlipidemia, unspecified: Secondary | ICD-10-CM

## 2019-06-27 DIAGNOSIS — I63411 Cerebral infarction due to embolism of right middle cerebral artery: Principal | ICD-10-CM

## 2019-06-27 LAB — HEMOGLOBIN A1C
Hgb A1c MFr Bld: 6.3 % — ABNORMAL HIGH (ref 4.8–5.6)
Mean Plasma Glucose: 134.11 mg/dL

## 2019-06-27 LAB — BASIC METABOLIC PANEL
Anion gap: 13 (ref 5–15)
BUN: 17 mg/dL (ref 8–23)
CO2: 21 mmol/L — ABNORMAL LOW (ref 22–32)
Calcium: 9.2 mg/dL (ref 8.9–10.3)
Chloride: 104 mmol/L (ref 98–111)
Creatinine, Ser: 0.8 mg/dL (ref 0.61–1.24)
GFR calc Af Amer: >60 mL/min (ref >60)
GFR calc non Af Amer: >60 mL/min (ref >60)
Glucose, Bld: 88 mg/dL (ref 70–99)
Potassium: 3.7 mmol/L (ref 3.5–5.1)
Sodium: 138 mmol/L (ref 135–145)

## 2019-06-27 LAB — CBC
HCT: 41.2 % (ref 39.0–52.0)
Hemoglobin: 13.2 g/dL (ref 13.0–17.0)
MCH: 29.4 pg (ref 26.0–34.0)
MCHC: 32 g/dL (ref 30.0–36.0)
MCV: 91.8 fL (ref 80.0–100.0)
Platelets: 141 10*3/uL — ABNORMAL LOW (ref 150–400)
RBC: 4.49 MIL/uL (ref 4.22–5.81)
RDW: 14.1 % (ref 11.5–15.5)
WBC: 10.4 10*3/uL (ref 4.0–10.5)
nRBC: 0 % (ref 0.0–0.2)

## 2019-06-27 LAB — BRAIN NATRIURETIC PEPTIDE: B Natriuretic Peptide: 64.5 pg/mL (ref 0.0–100.0)

## 2019-06-27 LAB — LIPID PANEL
Cholesterol: 168 mg/dL (ref 0–200)
HDL: 22 mg/dL — ABNORMAL LOW (ref >40)
LDL Cholesterol: 121 mg/dL — ABNORMAL HIGH (ref 0–99)
Total CHOL/HDL Ratio: 7.6 RATIO
Triglycerides: 126 mg/dL (ref <150)
VLDL: 25 mg/dL (ref 0–40)

## 2019-06-27 LAB — LACTATE DEHYDROGENASE: LDH: 224 U/L — ABNORMAL HIGH (ref 98–192)

## 2019-06-27 LAB — FERRITIN: Ferritin: 393 ng/mL — ABNORMAL HIGH (ref 24–336)

## 2019-06-27 LAB — CK TOTAL AND CKMB (NOT AT ARMC)
CK, MB: 3.2 ng/mL (ref 0.5–5.0)
Relative Index: INVALID (ref 0.0–2.5)
Total CK: 77 U/L (ref 49–397)

## 2019-06-27 LAB — D-DIMER, QUANTITATIVE: D-Dimer, Quant: >20 ug/mL-FEU — ABNORMAL HIGH (ref 0.00–0.50)

## 2019-06-27 LAB — C-REACTIVE PROTEIN: CRP: 11.1 mg/dL — ABNORMAL HIGH (ref <1.0)

## 2019-06-27 MED ORDER — CLOPIDOGREL BISULFATE 75 MG PO TABS
75.0000 mg | ORAL_TABLET | Freq: Every day | ORAL | Status: DC
  Administered 2019-06-27 – 2019-06-28 (×2): 75 mg via ORAL
  Filled 2019-06-27 (×2): qty 1

## 2019-06-27 MED ORDER — ASPIRIN EC 325 MG PO TBEC
325.0000 mg | DELAYED_RELEASE_TABLET | Freq: Every day | ORAL | Status: DC
  Administered 2019-06-28: 325 mg via ORAL
  Filled 2019-06-27: qty 1

## 2019-06-27 MED ORDER — SODIUM CHLORIDE 0.9 % IV SOLN
INTRAVENOUS | Status: DC
  Administered 2019-06-27: 06:00:00 via INTRAVENOUS

## 2019-06-27 MED ORDER — STROKE: EARLY STAGES OF RECOVERY BOOK
Freq: Once | Status: AC
  Administered 2019-06-27: 06:00:00
  Filled 2019-06-27: qty 1

## 2019-06-27 MED ORDER — ATORVASTATIN CALCIUM 40 MG PO TABS
40.0000 mg | ORAL_TABLET | Freq: Every day | ORAL | Status: DC
  Administered 2019-06-27 – 2019-06-28 (×2): 40 mg via ORAL
  Filled 2019-06-27 (×2): qty 1

## 2019-06-27 NOTE — Progress Notes (Signed)
PROGRESS NOTE  Travis Salazar. AOZ:[id number] DOB: 1936-06-24   PCP: [name], MD  Patient is from: Home  DOA: 06/26/2019 LOS: 1  Brief Narrative / Interim history: 83 year old male with history of hypertension and hyperlipidemia admitted with chest pain, somnolence, difficulty using left hand and dropping objects and found to have right MCA CVA on CT head without contrast.  He also tested positive for COVID-19.  Reportedly, patient and wife had URI symptoms for couple of weeks prior to admission.  Patient does not have respiratory symptoms or oxygen requirement. Started on high-dose aspirin and a statin.  Evaluated by neurology.    Subjective: No major events overnight of this morning.  Still with some weakness and loss of sensation in LUE and LLE.  Denies chest pain, dyspnea, fever, cough, GI or GU symptoms.  Denies headache or vision change.  Oriented x4- date.  Objective: Vitals:   06/27/19 0200 06/27/19 0300 06/27/19 0500 06/27/19 0853  BP:  134/84 127/74 134/90  Pulse:  92 92 88  Resp:  18 20 16   Temp:  98.4 F (36.9 C) 98.2 F (36.8 C) 97.9 F (36.6 C)  TempSrc:  Oral Oral Oral  SpO2: 96% 100% 98%   Weight:      Height:        Intake/Output Summary (Last 24 hours) at 06/27/2019 1236 Last data filed at 06/27/2019 0600 Gross per 24 hour  Intake -  Output 250 ml  Net -250 ml   Filed Weights   06/26/19 1226 06/26/19 2124 06/26/19 2354  Weight: 100.2 kg 89.2 kg 89.2 kg    Examination:  GENERAL: No acute distress.  Appears well.  HEENT: MMM.  Vision and hearing grossly intact.  NECK: Supple.  No apparent JVD.  LUNGS:  No IWOB. Good air movement bilaterally. HEART:  RRR. Heart sounds normal.  ABD: Bowel sounds present. Soft. Non tender.  MSK/EXT:  Moves extremities. No apparent deformity or edema.  SKIN: no apparent skin lesion or wound NEURO: Awake, alert and oriented appropriately.  Cranial nerves intact.  Motor 4/5 in LLE, 4/5 in LUE except for  3+/5 grip strength in left hand.  Light sensation diminished in LUE and LLE.  Some pronator drift on the left.  Finger-to-nose course on the left.  PSYCH: Calm. Normal affect.    I have personally reviewed the following labs and images:  Radiology Studies: No results found.  Microbiology: Recent Results (from the past 240 hour(s))  SARS Coronavirus 2 (CEPHEID - Performed in [place] hospital lab), Hosp Order     Status: Abnormal   Collection Time: 06/26/19 12:36 PM   Specimen: Nasopharyngeal Swab  Result Value Ref Range Status   SARS Coronavirus 2 POSITIVE (A) NEGATIVE Final    Comment: RESULT CALLED TO, READ BACK BY AND VERIFIED WITH: RN A MCKEOWN [phone] MLM (NOTE) If result is NEGATIVE SARS-CoV-2 target nucleic acids are NOT DETECTED. The SARS-CoV-2 RNA is generally detectable in upper and lower  respiratory specimens during the acute phase of infection. The lowest  concentration of SARS-CoV-2 viral copies this assay can detect is 250  copies / mL. A negative result does not preclude SARS-CoV-2 infection  and should not be used as the sole basis for treatment or other  patient management decisions.  A negative result may occur with  improper specimen collection / handling, submission of specimen other  than nasopharyngeal swab, presence of viral mutation(s) within the  areas targeted by this assay, and inadequate  number of viral copies  (<250 copies / mL). A negative result must be combined with clinical  observations, patient history, and epidemiological information. If result is POSITIVE SARS-CoV-2 target nucleic acids are DETECTED. The SARS -CoV-2 RNA is generally detectable in upper and lower  respiratory specimens during the acute phase of infection.  Positive  results are indicative of active infection with SARS-CoV-2.  Clinical  correlation with patient history and other diagnostic information is  necessary to determine patient infection status.  Positive  results do  not rule out bacterial infection or co-infection with other viruses. If result is PRESUMPTIVE POSTIVE SARS-CoV-2 nucleic acids MAY BE PRESENT.   A presumptive positive result was obtained on the submitted specimen  and confirmed on repeat testing.  While 2019 novel coronavirus  (SARS-CoV-2) nucleic acids may be present in the submitted sample  additional confirmatory testing may be necessary for epidemiological  and / or clinical management purposes  to differentiate between  SARS-CoV-2 and other Sarbecovirus currently known to infect humans.  If clinically indicated additional testing with an alternate test  methodology [phone]) is advise d. The SARS-CoV-2 RNA is generally  detectable in upper and lower respiratory specimens during the acute  phase of infection. The expected result is Negative. Fact Sheet for Patients:  StrictlyIdeas.no Fact Sheet for Healthcare Providers: BankingDealers.co.za This test is not yet approved or cleared by the [place] FDA and has been authorized for detection and/or diagnosis of SARS-CoV-2 by FDA under an Emergency Use Authorization (EUA).  This EUA will remain in effect (meaning this test can be used) for the duration of the COVID-19 declaration under Section 564(b)(1) of the Act, 21 U.S.C. section 360bbb-3(b)(1), unless the authorization is terminated or revoked sooner. Performed at [facility], [address], [place], [place] [address]     Sepsis Labs: Invalid input(s): PROCALCITONIN, LACTICIDVEN  Urine analysis: No results found for: COLORURINE, APPEARANCEUR, LABSPEC, PHURINE, GLUCOSEU, HGBUR, BILIRUBINUR, KETONESUR, PROTEINUR, UROBILINOGEN, NITRITE, LEUKOCYTESUR  Anemia Panel: No results for input(s): VITAMINB12, FOLATE, FERRITIN, TIBC, IRON, RETICCTPCT in the last 72 hours.  Thyroid Function Tests: No results for input(s): TSH, T4TOTAL, FREET4, T3FREE, THYROIDAB in  the last 72 hours.  Lipid Profile: Recent Labs    06/27/19 0548  CHOL 168  HDL 22*  LDLCALC 121*  TRIG 126  CHOLHDL 7.6    CBG: Recent Labs  Lab 06/26/19 1154  GLUCAP 129*    HbA1C: Recent Labs    06/27/19 0548  HGBA1C 6.3*    BNP (last 3 results): No results for input(s): PROBNP in the last 8760 hours.  Cardiac Enzymes: No results for input(s): CKTOTAL, CKMB, CKMBINDEX, TROPONINI in the last 168 hours.  Coagulation Profile: Recent Labs  Lab 06/26/19 1155  INR 1.4*    Liver Function Tests: Recent Labs  Lab 06/26/19 1155  AST 24  ALT 23  ALKPHOS 42  BILITOT 1.0  PROT 7.3  ALBUMIN 3.3*   No results for input(s): LIPASE, AMYLASE in the last 168 hours. No results for input(s): AMMONIA in the last 168 hours.  Basic Metabolic Panel: Recent Labs  Lab 06/26/19 1155 06/26/19 1159 06/27/19 0548  NA 138 138 138  K 3.6 3.6 3.7  CL 103 104 104  CO2 19*  --  21*  GLUCOSE 132* 132* 88  BUN 19 20 17   CREATININE 0.90 0.70 0.80  CALCIUM 9.5  --  9.2   GFR: Estimated Creatinine Clearance: 81.4 mL/min (by C-G formula based on SCr of 0.8  mg/dL).  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 -CT head concerning for right MCA territory infarct. -CT perfusion demonstrating large area of penumbra -CTA head/neck without flow obstructing stenosis but atherosclerotic change especially on the right -Ech[facility] at [facility] per neurology, Dr. Erlinda Hong -Permissive hypertension but maintain SBP less than 180 -Continue Plavix, high-dose aspirin and statin -Continue telemetry.  Event monitor on discharge -PT/OT/SLP -Okay to transfer to  [facility] per neurology.  COVID-19 infection: both patient and wife are respiratory symptoms 2 weeks ago.  Wife tested positive for COVID on 06/21/2009.  I believe he is on the tail end of his COVID infection.  No respiratory distress or desaturation here. -Check portable CXR and inflammatory markers.  Hypertension: Normotensive -Keep SBP less than 180 and normalize gradually. -Resume home meds gradually.  Dyslipidemia: LDL 121.  HDL 22.  -Continue high-dose statin  Prediabetes: A1c 6.3%.  DVT prophylaxis: Subcu Lovenox Code Status: Full code Family Communication: Updated patient's wife over the phone. Disposition Plan: Transfer to [facility], Telemetry bed  Consultants: Neurology   Antimicrobials: Anti-infectives (From admission, onward)   None      Sch Meds:  Scheduled Meds: .  stroke: mapping our early stages of recovery book   Does not apply Once  . aspirin  300 mg Rectal Daily   Or  . aspirin  325 mg Oral Daily  . atorvastatin  80 mg Oral q1800  . enoxaparin (LOVENOX) injection  40 mg Subcutaneous Q24H  . sodium chloride flush  3 mL Intravenous Once   Continuous Infusions: . sodium chloride 75 mL/hr at 06/27/19 0558   PRN Meds:.acetaminophen **OR** acetaminophen, ondansetron **OR** ondansetron (ZOFRAN) IV   Travis Salazar T. [place]  If 7PM-7AM, please contact night-coverage [email or web address] Password [facility] 06/27/2019, 12:36 PM

## 2019-06-27 NOTE — Evaluation (Signed)
Physical Therapy Evaluation Patient Details Name: Travis Salazar. MRN: [id number] DOB: 1936/06/08 Today's Date: 06/27/2019   History of Present Illness  Travis Salazar. is a 83 y.o. male with medical history significant of dyslipidemia and hypertension admitted with left sided weakness. MC:NOBSJ right MCA territory infarct with early mass-effect/no midline shift and COVID +    Clinical Impression  Pt admitted with above diagnosis. Pt currently with functional limitations due to the deficits listed below (see PT Problem List). PTA, pt independent living at home with wife. Today patient presents with L sided visual deficits, mild imbalance. Ambulating short distances in room on RA without De-sat Pt will benefit from skilled PT to increase their independence and safety with mobility to allow discharge to the venue listed below.       Follow Up Recommendations Home health PT;Supervision/Assistance - 24 hour(with progress)    Equipment Recommendations  Rolling walker with 5" wheels    Recommendations for Other Services       Precautions / Restrictions Precautions Precautions: Fall Precaution Comments: left homonymous hemianopsia Restrictions Weight Bearing Restrictions: No      Mobility  Bed Mobility Overal bed mobility: Modified Independent                Transfers Overall transfer level: Needs assistance Equipment used: 1 person hand held assist Transfers: Sit to/from Stand Sit to Stand: Min guard            Ambulation/Gait Ambulation/Gait assistance: Min guard Gait Distance (Feet): 40 Feet Assistive device: Rolling walker (2 wheeled) Gait Pattern/deviations: Step-to pattern Gait velocity: decreased   General Gait Details: mild unsteadiness, use of RW for safety. did not de-sat on RA  Stairs            Wheelchair Mobility    Modified Rankin (Stroke Patients Only)       Balance Overall balance assessment: Needs assistance Sitting-balance  support: No upper extremity supported;Feet supported Sitting balance-Leahy Scale: Good     Standing balance support: No upper extremity supported Standing balance-Leahy Scale: Fair                               Pertinent Vitals/Pain Pain Assessment: No/denies pain    Home Living Family/patient expects to be discharged to:: Private residence Living Arrangements: Spouse/significant other Available Help at Discharge: Family;Available 24 hours/day Type of Home: House Home Access: Stairs to enter   [employer] of Steps: 1 Home Layout: One level Home Equipment: Shower seat;Hand held shower head Additional Comments: wife has RW and [facility]    Prior Function Level of Independence: Independent               Hand Dominance        Extremity/Trunk Assessment   Upper Extremity Assessment Upper Extremity Assessment: Overall WFL for tasks assessed    Lower Extremity Assessment Lower Extremity Assessment: Overall WFL for tasks assessed       Communication      Cognition Arousal/Alertness: Awake/alert Behavior During Therapy: WFL for tasks assessed/performed Overall Cognitive Status: Impaired/Different from baseline Area of Impairment: Safety/judgement                         Safety/Judgement: Decreased awareness of deficits     General Comments: left homonymous hemianopsia      General Comments      Exercises     Assessment/Plan    PT Assessment  Patient needs continued PT services  PT Problem List Decreased strength       PT Treatment Interventions DME instruction;Stair training;Gait training;Functional mobility training;Therapeutic activities;Therapeutic exercise;Balance training;Neuromuscular re-education;Cognitive remediation    PT Goals (Current goals can be found in the Care Plan section)  Acute Rehab PT Goals Patient Stated Goal: walk PT Goal Formulation: With patient Time For Goal Achievement: 07/11/19 Potential to  Achieve Goals: Good    Frequency Min 3X/week   Barriers to discharge        Co-evaluation PT/OT/SLP Co-Evaluation/Treatment: Yes Reason for Co-Treatment: Necessary to address cognition/behavior during functional activity;Complexity of the patient's impairments (multi-system involvement) PT goals addressed during session: Mobility/safety with mobility;Balance;Proper use of DME;Strengthening/ROM OT goals addressed during session: ADL's and self-care;Strengthening/ROM       AM-PAC PT "6 Clicks" Mobility  Outcome Measure Help needed turning from your back to your side while in a flat bed without using bedrails?: None Help needed moving from lying on your back to sitting on the side of a flat bed without using bedrails?: None Help needed moving to and from a bed to a chair (including a wheelchair)?: None Help needed standing up from a chair using your arms (e.g., wheelchair or bedside chair)?: A Little Help needed to walk in hospital room?: A Little Help needed climbing 3-5 steps with a railing? : A Lot 6 Click Score: 20    End of Session   Activity Tolerance: Patient tolerated treatment well Patient left: in bed;with call bell/phone within reach Nurse Communication: Mobility status PT Visit Diagnosis: Unsteadiness on feet (R26.81)    Time: 5110-2111 PT Time Calculation (min) (ACUTE ONLY): 35 min   Charges:   PT Evaluation $PT Eval High Complexity: 1 High          Reinaldo Berber, PT, DPT Acute Rehabilitation Services Pager: [phone] Office: [phone]    Reinaldo Berber 06/27/2019, 4:22 PM

## 2019-06-27 NOTE — Progress Notes (Signed)
SLP Cancellation Note  Patient Details Name: Travis Salazar. MRN: [id number] DOB: 07-27-1936   Cancelled treatment:       Reason Eval/Treat Not Completed: Other (comment). Pt passed [facility] swallow screen and diet has been initiated appropriately. Pt will transfer to [facility]. Will f/u there for cognitive linguistic assessment as able.    Kaliegh Willadsen, Katherene Ponto 06/27/2019, 10:05 AM

## 2019-06-27 NOTE — Evaluation (Signed)
Occupational Therapy Evaluation Patient Details Name: Travis Salazar. MRN: [id number] DOB: 06/19/1936 Today's Date: 06/27/2019    History of Present Illness Travis Salazar. is a 83 y.o. male with medical history significant of dyslipidemia and hypertension admitted with left sided weakness. VP:XTGGY right MCA territory infarct with early mass-effect/no midline shift and COVID +   Clinical Impression   This 83 yo male admitted with above presents to acute OT with left homonymous hemianopsia thus affecting his safety and independence with basic ADL, IADLs, and driving. He will benefit from continued acute OT with follow up [place].    Follow Up Recommendations  Home health OT    Equipment Recommendations  None recommended by OT       Precautions / Restrictions Precautions Precaution Comments: left homonymous hemianopsia Restrictions Weight Bearing Restrictions: No      Mobility Bed Mobility Overal bed mobility: Modified Independent                Transfers Overall transfer level: Needs assistance Equipment used: 1 person hand held assist Transfers: Sit to/from Stand Sit to Stand: Min guard              Balance Overall balance assessment: Needs assistance Sitting-balance support: No upper extremity supported;Feet supported Sitting balance-Leahy Scale: Good     Standing balance support: No upper extremity supported Standing balance-Leahy Scale: Fair                             ADL either performed or assessed with clinical judgement   ADL Overall ADL's : Needs assistance/impaired Eating/Feeding: Independent;Sitting   Grooming: Set up;Sitting   Upper Body Bathing: Set up;Sitting   Lower Body Bathing: Sit to/from stand;Min guard   Upper Body Dressing : Set up;Sitting   Lower Body Dressing: Min guard;Sit to/from stand   Toilet Transfer: Min guard;Ambulation;Grab bars;Comfort height toilet   Toileting- Clothing Manipulation and Hygiene: Min  guard;Sit to/from stand         General ADL Comments: I dicussed with him that he should not be driving     Vision Baseline Vision/History: Wears glasses Wears Glasses: At all times(does not have them with him) Patient Visual Report: No change from baseline Vision Assessment?: Yes Eye Alignment: Within Functional Limits Ocular Range of Motion: Within Functional Limits Alignment/Gaze Preference: Within Defined Limits Tracking/Visual Pursuits: Requires cues, head turns, or add eye shifts to track;Other (comment)(has trouble moving eyes and not head) Visual Fields: Left homonymous hemianopsia            Pertinent Vitals/Pain Pain Assessment: No/denies pain     Hand Dominance  right   Extremity/Trunk Assessment Upper Extremity Assessment Upper Extremity Assessment: Overall WFL for tasks assessed   Lower Extremity Assessment Lower Extremity Assessment: Overall WFL for tasks assessed       Communication  no issues   Cognition Arousal/Alertness: Awake/alert Behavior During Therapy: WFL for tasks assessed/performed Overall Cognitive Status: Impaired/Different from baseline Area of Impairment: Safety/judgement                         Safety/Judgement: Decreased awareness of deficits     General Comments: left homonymous hemianopsia              Home Living Family/patient expects to be discharged to:: Private residence Living Arrangements: Spouse/significant other Available Help at Discharge: Family;Available 24 hours/day Type of Home: House Home Access: Stairs to enter [employer]  of Steps: 1   Home Layout: One level     Bathroom Shower/Tub: Teacher, early years/pre: Standard Bathroom Accessibility: Yes   Home Equipment: Shower seat;Hand held shower head   Additional Comments: wife has RW and SPC      Prior Functioning/Environment Level of Independence: Independent                 OT Problem List: Impaired  vision/perception      OT Treatment/Interventions: Self-care/ADL training;Visual/perceptual remediation/compensation;Patient/family education    OT Goals(Current goals can be found in the care plan section) Acute Rehab OT Goals OT Goal Formulation: With patient Time For Goal Achievement: 07/11/19 Potential to Achieve Goals: Good  OT Frequency: Min 2X/week           Co-evaluation PT/OT/SLP Co-Evaluation/Treatment: Yes Reason for Co-Treatment: For patient/therapist safety;To address functional/ADL transfers PT goals addressed during session: Mobility/safety with mobility;Balance;Strengthening/ROM OT goals addressed during session: ADL's and self-care;Strengthening/ROM      AM-PAC OT "6 Clicks" Daily Activity     Outcome Measure Help from another person eating meals?: None Help from another person taking care of personal grooming?: A Little Help from another person toileting, which includes using toliet, bedpan, or urinal?: A Little Help from another person bathing (including washing, rinsing, drying)?: A Little Help from another person to put on and taking off regular upper body clothing?: A Little Help from another person to put on and taking off regular lower body clothing?: A Little 6 Click Score: 19   End of Session Nurse Communication: Mobility status  Activity Tolerance: Patient tolerated treatment well Patient left: in bed;with call bell/phone within reach;with bed alarm set  OT Visit Diagnosis: Other (comment)(left visual field cut)                Time: 4818-5631 OT Time Calculation (min): 25 min Charges:  OT General Charges $OT Visit: 1 Visit OT Evaluation $OT Eval Moderate Complexity: 1 Mod  [place] [address], OTR/L Acute [employer] Pager [phone] Office [phone]     Almon Register 06/27/2019, 3:58 PM

## 2019-06-27 NOTE — Progress Notes (Signed)
   Spoke to Dr. Cyndia Skeeters as well as Dr. Erlinda Hong (with neurology) [name] is going to be transferred to [facility].  Tomorrow we will perform echocardiogram at [facility] for continued stroke work-up.  Relayed the message to Florentina Jenny with echocardiogram department.  Candee Furbish, MD

## 2019-06-27 NOTE — Plan of Care (Addendum)
Pt alert and oriented, denies any c/o pain or discomfort. Sats >95% on RA. VSS,afebrile. Daughter updated. Pt stable at this time will continue to monitor. MD updated at 0530 that pt hasn't voided, N.O. received to start MIVF @75ml /hr and bladder scan. Bladder scan indicated 364ml in bladder. MD oncall updated regarding results, awaiting any further N.O. Will continue to monitor

## 2019-06-27 NOTE — Progress Notes (Signed)
STROKE TEAM PROGRESS NOTE   SUBJECTIVE (INTERVAL HISTORY) His family is not at the bedside.  Pt neuro stable, no complains. Denies heart palpitation or chest pain. Still has mild left arm drift, tends to neglect on the left and left hemianopia. 2D pending.    OBJECTIVE Vitals:   06/27/19 0200 06/27/19 0300 06/27/19 0500 06/27/19 0853  BP:  134/84 127/74 134/90  Pulse:  92 92 88  Resp:  18 20 16   Temp:  98.4 F (36.9 C) 98.2 F (36.8 C) 97.9 F (36.6 C)  TempSrc:  Oral Oral Oral  SpO2: 96% 100% 98%   Weight:      Height:        CBC:  Recent Labs  Lab 06/26/19 1155 06/26/19 1159 06/27/19 0548  WBC 11.8*  --  10.4  NEUTROABS 8.2*  --   --   HGB 14.4 15.6 13.2  HCT 45.8 46.0 41.2  MCV 92.9  --  91.8  PLT 133*  --  141*    Basic Metabolic Panel:  Recent Labs  Lab 06/26/19 1155 06/26/19 1159 06/27/19 0548  NA 138 138 138  K 3.6 3.6 3.7  CL 103 104 104  CO2 19*  --  21*  GLUCOSE 132* 132* 88  BUN 19 20 17   CREATININE 0.90 0.70 0.80  CALCIUM 9.5  --  9.2    Lipid Panel:     Component Value Date/Time   CHOL 168 06/27/2019 0548   CHOL 184 10/07/2018 1138   TRIG 126 06/27/2019 0548   HDL 22 (L) 06/27/2019 0548   HDL 41 10/07/2018 1138   CHOLHDL 7.6 06/27/2019 0548   VLDL 25 06/27/2019 0548   LDLCALC 121 (H) 06/27/2019 0548   LDLCALC 120 (H) 10/07/2018 1138   LDLCALC 119 (H) 10/06/2017 1208   HgbA1c:  Lab Results  Component Value Date   HGBA1C 6.3 (H) 06/27/2019   Urine Drug Screen: No results found for: LABOPIA, COCAINSCRNUR, LABBENZ, AMPHETMU, THCU, LABBARB  Alcohol Level No results found for: ETH  IMAGING  Ct Angio Head W Or Wo Contrast Ct Angio Neck W Or Wo Contrast Ct Cerebral Perfusion W Contrast 06/26/2019 IMPRESSION:  1. CT perfusion results are discordant from the noncontrast head CT. This likely represents pseudo normalization. Noncontrast head CT demonstrates a large right MCA territory infarct with an aspects score of 5/10.  2. No  emergent large vessel occlusion.  3. Asymmetric atherosclerotic changes in the right MCA branch vessels including proximal M2 segments corresponding to the infarct territory.  4. Atherosclerotic changes involving the aortic arch, carotid bifurcations, and cavernous internal carotid arteries bilaterally without significant stenosis.  5. Multilevel degenerative changes the cervical spine.  6. 6 mm pleural-based right upper lobe pulmonary nodule. Additional airspace opacification is present laterally in the upper lobes bilaterally. This may be related to fibrotic change. Follow-up non-contrast CT recommended at 3-6 months to confirm persistence. If unchanged, and solid component remains <6 mm, annual CT is recommended until 5 years of stability has been established. If persistent these nodules should be considered highly suspicious if the solid component of the nodule is 6 mm or greater in size and enlarging. This recommendation follows the consensus statement: Guidelines for Management of Incidental Pulmonary Nodules Detected on CT Images: From the Fleischner Society 2017; Radiology 2017; 284:228-243.   Ct Head Code Stroke Wo Contrast 06/26/2019 IMPRESSION:  1. Large right MCA territory infarct without hemorrhage.  2. Early mass effect with partial effacement the sulci but no significant  midline shift.  3. Atherosclerosis  4. ASPECTS is 5/10   Transthoracic Echocardiogram  00/00/2020 Pending   PHYSICAL EXAM  Temp:  [97.9 F (36.6 C)-98.7 F (37.1 C)] 97.9 F (36.6 C) (07/12 0853) Pulse Rate:  [88-96] 88 (07/12 0853) Resp:  [16-25] 16 (07/12 0853) BP: (111-141)/(66-97) 134/90 (07/12 0853) SpO2:  [93 %-100 %] 98 % (07/12 0500) Weight:  [89.2 kg] 89.2 kg (07/11 2354)  General - Well nourished, well developed, in no apparent distress.  Ophthalmologic - fundi not visualized due to noncooperation.  Cardiovascular - Regular rate and rhythm.  Mental Status -  Level of arousal and  orientation to age, place, and person were intact, not orientated to time. Language including expression, naming, repetition, comprehension was assessed and found intact. Neglect to the left  Cranial Nerves II - XII - II - left hemianopia. III, IV, VI - Extraocular movements intact. V - Facial sensation intact bilaterally. VII - slight flattening of left nasolabial fold. VIII - Hearing & vestibular intact bilaterally. X - Palate elevates symmetrically. XI - Chin turning & shoulder shrug intact bilaterally. XII - Tongue protrusion intact.  Motor Strength - The patient's strength was normal in all extremities except left pronator drift was present.  Bulk was normal and fasciculations were absent.   Motor Tone - Muscle tone was assessed at the neck and appendages and was normal.  Reflexes - The patient's reflexes were symmetrical in all extremities and he had no pathological reflexes.  Sensory - Light touch, temperature/pinprick were assessed and were symmetrical.    Coordination - The patient had normal movements in the right hand with no ataxia or dysmetria, however, left mild dysmetria.  Tremor was absent.  Gait and Station - deferred.   ASSESSMENT/PLAN Mr. Sanjuan Sawa. is a 83 y.o. male with history of HTN, and HLD who presents to [facility] ED with left side weakness, neglect and inability to swallow. CT head revealed large Right MCA infarct. He did not receive IV t-PA due to late presentation (>4.5 hours from time of onset). He was found to be  [employer] Virus 2 positive.   Stroke:  Large Rt MCA infarct - embolic - source unknown, large vessel athero vs. Occult afib  Resultant  Left hemianopia, left pronator drift and left neglect  CT head - Large right MCA territory infarct without hemorrhage. Early mass effect with partial effacement the sulci but no significant midline shift.   MRI not performed due to COVID positive  CTA H&N and P - Asymmetric atherosclerotic changes in the  right MCA branch vessels including proximal M2 segments corresponding to the infarct territory. B/l carotid bulb and siphon athero but no significant stenosis  2D Echo - pending  Recommend outpt 30 day cardiac event monitoring to rule out afib as outpt  [employer] Virus 2 - positive  LDL - 121  HgbA1c - 6.3  UDS - pending  VTE prophylaxis - Lovenox  Diet  - Heart healthy with thin liquids.  aspirin 325 mg daily prior to admission, now on aspirin 325 mg daily and plavix. Recommend DAPT for 3 months and then plavix alone given intracranial stenosis.   Patient counseled to be compliant with his antithrombotic medications  Ongoing aggressive stroke risk factor management  Therapy recommendations:  pending  Disposition:  OK to transfer to [facility] for further care from neuro standpoint.  COVID - 19 infection  Asymptomatic   COVID PCR positive  Treatment as per primary team  Hypertension  Stable . Permissive hypertension (OK if < 220/120) but gradually normalize in 2-3 days . Long-term BP goal normotensive  Hyperlipidemia  Lipid lowering medication PTA:  Lipitor 10 mg daily  LDL 121, goal < 70  Current lipid lowering medication: Lipitor 40 mg daily  Continue statin at discharge  Other Stroke Risk Factors  Advanced age  Former cigarette smoker - quit  Obesity, Body mass index is 29.04 kg/m., recommend weight loss, diet and exercise as appropriate  Other Active Problems  Mild Thrombocytopenia - 133->141  6 mm pleural-based right upper lobe pulmonary nodule. Follow-up non-contrast CT recommended at 3-6 months to confirm persistence.  Hospital day # 1  Rosalin Hawking, MD PhD Stroke Neurology 06/27/2019 12:41 PM    To contact Stroke Continuity provider, please refer to [email or web address] After hours, contact General Neurology

## 2019-06-28 ENCOUNTER — Inpatient Hospital Stay (HOSPITAL_COMMUNITY): Payer: Medicare Other

## 2019-06-28 ENCOUNTER — Telehealth: Payer: Self-pay | Admitting: Family Medicine

## 2019-06-28 DIAGNOSIS — I340 Nonrheumatic mitral (valve) insufficiency: Secondary | ICD-10-CM

## 2019-06-28 LAB — ECHOCARDIOGRAM LIMITED
Height: 69 in
Weight: 3146.41 oz

## 2019-06-28 LAB — CBC WITH DIFFERENTIAL/PLATELET
Abs Immature Granulocytes: 0.06 10*3/uL (ref 0.00–0.07)
Basophils Absolute: 0 10*3/uL (ref 0.0–0.1)
Basophils Relative: 0 %
Eosinophils Absolute: 0.1 10*3/uL (ref 0.0–0.5)
Eosinophils Relative: 2 %
HCT: 37.5 % — ABNORMAL LOW (ref 39.0–52.0)
Hemoglobin: 12.2 g/dL — ABNORMAL LOW (ref 13.0–17.0)
Immature Granulocytes: 1 %
Lymphocytes Relative: 19 %
Lymphs Abs: 1.7 10*3/uL (ref 0.7–4.0)
MCH: 29.8 pg (ref 26.0–34.0)
MCHC: 32.5 g/dL (ref 30.0–36.0)
MCV: 91.5 fL (ref 80.0–100.0)
Monocytes Absolute: 1.2 10*3/uL — ABNORMAL HIGH (ref 0.1–1.0)
Monocytes Relative: 14 %
Neutro Abs: 5.7 10*3/uL (ref 1.7–7.7)
Neutrophils Relative %: 64 %
Platelets: 146 10*3/uL — ABNORMAL LOW (ref 150–400)
RBC: 4.1 MIL/uL — ABNORMAL LOW (ref 4.22–5.81)
RDW: 14.1 % (ref 11.5–15.5)
WBC: 8.9 10*3/uL (ref 4.0–10.5)
nRBC: 0 % (ref 0.0–0.2)

## 2019-06-28 LAB — C-REACTIVE PROTEIN: CRP: 11.4 mg/dL — ABNORMAL HIGH (ref <1.0)

## 2019-06-28 LAB — COMPREHENSIVE METABOLIC PANEL
ALT: 19 U/L (ref 0–44)
AST: 19 U/L (ref 15–41)
Albumin: 2.6 g/dL — ABNORMAL LOW (ref 3.5–5.0)
Alkaline Phosphatase: 34 U/L — ABNORMAL LOW (ref 38–126)
Anion gap: 9 (ref 5–15)
BUN: 17 mg/dL (ref 8–23)
CO2: 25 mmol/L (ref 22–32)
Calcium: 8.7 mg/dL — ABNORMAL LOW (ref 8.9–10.3)
Chloride: 106 mmol/L (ref 98–111)
Creatinine, Ser: 0.85 mg/dL (ref 0.61–1.24)
GFR calc Af Amer: >60 mL/min (ref >60)
GFR calc non Af Amer: >60 mL/min (ref >60)
Glucose, Bld: 106 mg/dL — ABNORMAL HIGH (ref 70–99)
Potassium: 4.1 mmol/L (ref 3.5–5.1)
Sodium: 140 mmol/L (ref 135–145)
Total Bilirubin: 1.1 mg/dL (ref 0.3–1.2)
Total Protein: 6 g/dL — ABNORMAL LOW (ref 6.5–8.1)

## 2019-06-28 LAB — D-DIMER, QUANTITATIVE: D-Dimer, Quant: >20 ug/mL-FEU — ABNORMAL HIGH (ref 0.00–0.50)

## 2019-06-28 LAB — LACTATE DEHYDROGENASE: LDH: 207 U/L — ABNORMAL HIGH (ref 98–192)

## 2019-06-28 LAB — FERRITIN: Ferritin: 403 ng/mL — ABNORMAL HIGH (ref 24–336)

## 2019-06-28 MED ORDER — ASPIRIN 325 MG PO TBEC: 325.0000 mg | DELAYED_RELEASE_TABLET | Freq: Every day | ORAL | 1 refills | Status: DC

## 2019-06-28 MED ORDER — ATORVASTATIN CALCIUM 40 MG PO TABS: 40.0000 mg | ORAL_TABLET | Freq: Every day | ORAL | 1 refills | Status: DC

## 2019-06-28 MED ORDER — CLOPIDOGREL BISULFATE 75 MG PO TABS: 75.0000 mg | ORAL_TABLET | Freq: Every day | ORAL | 1 refills | Status: DC

## 2019-06-28 MED FILL — ATORVASTATIN CALCIUM 40 MG: 40 | 30 days supply | Qty: 30 | Fill #0

## 2019-06-28 MED FILL — CLOPIDOGREL 75 MG TABLET: 75 | 30 days supply | Qty: 30 | Fill #0

## 2019-06-28 MED FILL — ASPIRIN EC 325 MG TABLET: 325 | 90 days supply | Qty: 90 | Fill #0

## 2019-06-28 NOTE — Discharge Instructions (Signed)
Prevent the Spread of COVID-19 if You Are Sick If you are sick with COVID-19 or think you might have COVID-19, follow the steps below to help protect other people in your home and community. Stay home except to get medical care.  Stay home. Most people with COVID-19 have mild illness and are able to recover at home without medical care. Do not leave your home, except to get medical care. Do not visit public areas.  Take care of yourself. Get rest and stay hydrated.  Get medical care when needed. Call your doctor before you go to their office for care. But, if you have trouble breathing or other concerning symptoms, call 911 for immediate help.  Avoid public transportation, ride-sharing, or taxis. Separate yourself from other people and pets in your home.  As much as possible, stay in a specific room and away from other people and pets in your home. Also, you should use a separate bathroom, if available. If you need to be around other people or animals in or outside of the home, wear a cloth face covering. ? See COVID-19 and Animals if you have questions about pets: https://www.cdc.gov/coronavirus/2019-ncov/faq.html#COVID19animals Monitor your symptoms.  Common symptoms of COVID-19 include fever and cough. Trouble breathing is a more serious symptom that means you should get medical attention.  Follow care instructions from your healthcare provider and local health department. Your local health authorities will give instructions on checking your symptoms and reporting information. If you develop emergency warning signs for COVID-19 get medical attention immediately.  Emergency warning signs include*:  Trouble breathing  Persistent pain or pressure in the chest  New confusion or not able to be woken  Bluish lips or face *This list is not all inclusive. Please consult your medical provider for any other symptoms that are severe or concerning to you. Call 911 if you have a medical  emergency. If you have a medical emergency and need to call 911, notify the operator that you have or think you might have, COVID-19. If possible, put on a facemask before medical help arrives. Call ahead before visiting your doctor.  Call ahead. Many medical visits for routine care are being postponed or done by phone or telemedicine.  If you have a medical appointment that cannot be postponed, call your doctor's office. This will help the office protect themselves and other patients. If you are sick, wear a cloth covering over your nose and mouth.  You should wear a cloth face covering over your nose and mouth if you must be around other people or animals, including pets (even at home).  You don't need to wear the cloth face covering if you are alone. If you can't put on a cloth face covering (because of trouble breathing for example), cover your coughs and sneezes in some other way. Try to stay at least 6 feet away from other people. This will help protect the people around you. Note: During the COVID-19 pandemic, medical grade facemasks are reserved for healthcare workers and some first responders. You may need to make a cloth face covering using a scarf or bandana. Cover your coughs and sneezes.  Cover your mouth and nose with a tissue when you cough or sneeze.  Throw used tissues in a lined trash can.  Immediately wash your hands with soap and water for at least 20 seconds. If soap and water are not available, clean your hands with an alcohol-based hand sanitizer that contains at least 60% alcohol. Clean your hands often.    Wash your hands often with soap and water for at least 20 seconds. This is especially important after blowing your nose, coughing, or sneezing; going to the bathroom; and before eating or preparing food.  Use hand sanitizer if soap and water are not available. Use an alcohol-based hand sanitizer with at least 60% alcohol, covering all surfaces of your hands and rubbing  them together until they feel dry.  Soap and water are the best option, especially if your hands are visibly dirty.  Avoid touching your eyes, nose, and mouth with unwashed hands. Avoid sharing personal household items.  Do not share dishes, drinking glasses, cups, eating utensils, towels, or bedding with other people in your home.  Wash these items thoroughly after using them with soap and water or put them in the dishwasher. Clean all "high-touch" surfaces everyday.  Clean and disinfect high-touch surfaces in your "sick room" and bathroom. Let someone else clean and disinfect surfaces in common areas, but not your bedroom and bathroom.  If a caregiver or other person needs to clean and disinfect a sick person's bedroom or bathroom, they should do so on an as-needed basis. The caregiver/other person should wear a mask and wait as long as possible after the sick person has used the bathroom. High-touch surfaces include phones, remote controls, counters, tabletops, doorknobs, bathroom fixtures, toilets, keyboards, tablets, and bedside tables.  Clean and disinfect areas that may have blood, stool, or body fluids on them.  Use household cleaners and disinfectants. Clean the area or item with soap and water or another detergent if it is dirty. Then use a household disinfectant. ? Be sure to follow the instructions on the label to ensure safe and effective use of the product. Many products recommend keeping the surface wet for several minutes to ensure germs are killed. Many also recommend precautions such as wearing gloves and making sure you have good ventilation during use of the product. ? Most EPA-registered household disinfectants should be effective. How to discontinue home isolation  People with COVID-19 who have stayed home (home isolated) can stop home isolation under the following conditions: ? If you will not have a test to determine if you are still contagious, you can leave home  after these three things have happened:  You have had no fever for at least 72 hours (that is three full days of no fever without the use of medicine that reduces fevers) AND  other symptoms have improved (for example, when your cough or shortness of breath has improved) AND  at least 10 days have passed since your symptoms first appeared. ? If you will be tested to determine if you are still contagious, you can leave home after these three things have happened:  You no longer have a fever (without the use of medicine that reduces fevers) AND  other symptoms have improved (for example, when your cough or shortness of breath has improved) AND  you received two negative tests in a row, 24 hours apart. Your doctor will follow CDC guidelines. In all cases, follow the guidance of your healthcare provider and local health department. The decision to stop home isolation should be made in consultation with your healthcare provider and state and local health departments. Local decisions depend on local circumstances. cdc.gov/coronavirus 04/18/2019 This information is not intended to replace advice given to you by your health care provider. Make sure you discuss any questions you have with your health care provider. Document Released: 03/30/2019 Document Revised: 04/28/2019 Document Reviewed: 03/30/2019   Elsevier Patient Education  2020 [employer]  

## 2019-06-28 NOTE — Telephone Encounter (Signed)
Yes--it must be evisit due to covid

## 2019-06-28 NOTE — Progress Notes (Signed)
Physical Therapy Treatment Patient Details Name: Travis Salazar. MRN: [id number] DOB: 21-Dec-1935 Today's Date: 06/28/2019    History of Present Illness Pt is an 83 y.o. male admitted 06/26/19 with L-side weakness. Head CT showed acute R MCA territory infarct with early mass-effect/no midline shift. Pt tested (+) COVID-19. PMH includes HTN.   PT Comments    Pt progressing well with mobility. Ambulatory without DME, requiring intermittent min guard with self-correct instability; pt declined use of DME. SpO2 90-98% on RA; no DOE noted. Pt continues to demonstrate some LUE dysmetria and decreased fine motor. Reports no visual deficits; pt's cognition limiting ability to complete visual field testing. Will continue to follow acutely.   Follow Up Recommendations  Home health PT;Supervision/Assistance - 24 hour     Equipment Recommendations  None recommended by PT    Recommendations for Other Services       Precautions / Restrictions Precautions Precautions: Fall Restrictions Weight Bearing Restrictions: No    Mobility  Bed Mobility Overal bed mobility: Independent                Transfers Overall transfer level: Needs assistance Equipment used: None Transfers: Sit to/from Stand Sit to Stand: Supervision            Ambulation/Gait Ambulation/Gait assistance: Supervision;Min guard Gait Distance (Feet): 100 Feet Assistive device: None Gait Pattern/deviations: Step-through pattern;Decreased stride length;Drifts right/left Gait velocity: Decreased Gait velocity interpretation: 1.31 - 2.62 ft/sec, indicative of limited community ambulator General Gait Details: Mostly steady gait in room without DME; intermittent self-correct instability, noted mostly to occur when pt turning quickly. Educ on safety with this. 1x seated rest break. SpO2 90-98% on RA; no DOE noted   [employer] Rankin (Stroke Patients Only)        Balance Overall balance assessment: Needs assistance Sitting-balance support: No upper extremity supported;Feet supported Sitting balance-Leahy Scale: Good       Standing balance-Leahy Scale: Fair                              Cognition Arousal/Alertness: Awake/alert Behavior During Therapy: WFL for tasks assessed/performed Overall Cognitive Status: No family/caregiver present to determine baseline cognitive functioning Area of Impairment: Problem solving;Safety/judgement;Following commands                       Following Commands: Follows multi-step commands inconsistently Safety/Judgement: Decreased awareness of deficits   Problem Solving: Requires verbal cues General Comments: Pt with difficulty following commands to complete visual field testing despite max cues      Exercises      General Comments General comments (skin integrity, edema, etc.): Declined use of DME for added stability      Pertinent Vitals/Pain Pain Assessment: No/denies pain    Home Living                      Prior Function            PT Goals (current goals can now be found in the care plan section) Acute Rehab PT Goals Patient Stated Goal: Go home today PT Goal Formulation: With patient Time For Goal Achievement: 07/11/19 Potential to Achieve Goals: Good Progress towards PT goals: Progressing toward goals    Frequency    Min 3X/week      PT Plan Current plan remains  appropriate    Co-evaluation              AM-PAC PT "6 Clicks" Mobility   Outcome Measure  Help needed turning from your back to your side while in a flat bed without using bedrails?: None Help needed moving from lying on your back to sitting on the side of a flat bed without using bedrails?: None Help needed moving to and from a bed to a chair (including a wheelchair)?: None Help needed standing up from a chair using your arms (e.g., wheelchair or bedside chair)?: A Little Help  needed to walk in hospital room?: A Little Help needed climbing 3-5 steps with a railing? : A Little 6 Click Score: 21    End of Session   Activity Tolerance: Patient tolerated treatment well Patient left: in bed;with call bell/phone within reach;with bed alarm set Nurse Communication: Mobility status PT Visit Diagnosis: Unsteadiness on feet (R26.81)     Time: 1572-6203 PT Time Calculation (min) (ACUTE ONLY): 21 min  Charges:  $Gait Training: 8-22 mins                    Mabeline Caras, PT, DPT Acute Rehabilitation Services  Pager [phone] Office [place] 06/28/2019, 11:36 AM

## 2019-06-28 NOTE — Progress Notes (Addendum)
Occupational Therapy Treatment Patient Details Name: Travis Salazar. MRN: [id number] DOB: June 19, 1936 Today's Date: 06/28/2019    History of present illness Pt is an 83 y.o. male admitted 06/26/19 with L-side weakness. Head CT showed acute R MCA territory infarct with early mass-effect/no midline shift. Pt tested (+) COVID-19. PMH includes HTN.   OT comments  Pt progressing towards OT goals. Session focus included practicing fine motor tasks as pt continues to demonstrate LUE fine motor difficulties. Additional focus on L visual field deficits. Practiced scanning activities this session both in functional context and via table top activities. Pt requiring min cues to accurately locate items and perform activity. Further educated pt in visual compensatory strategies for use during functional tasks at home to reduce risk for falls with pt verbalizing understanding. O2 sats maintaining >90% on RA with activity today. Pt eager to discharge home soon. Will continue per POC.   Follow Up Recommendations  Home health OT    Equipment Recommendations  None recommended by OT          Precautions / Restrictions Precautions Precautions: Fall Precaution Comments: left homonymous hemianopsia Restrictions Weight Bearing Restrictions: No       Mobility Bed Mobility Overal bed mobility: Modified Independent                Transfers Overall transfer level: Needs assistance Equipment used: None Transfers: Sit to/from Stand Sit to Stand: Supervision         General transfer comment: for safety    Balance Overall balance assessment: Needs assistance Sitting-balance support: No upper extremity supported;Feet supported Sitting balance-Leahy Scale: Good     Standing balance support: No upper extremity supported Standing balance-Leahy Scale: Fair                             ADL either performed or assessed with clinical judgement   ADL Overall ADL's : Needs  assistance/impaired     Grooming: Min guard;Standing                               Functional mobility during ADLs: Min guard       Vision   Additional Comments: continued assessment of visual deficits, educated pt in visual compensatory stratgies including "lighthouse method" for scanning environment. pt practicing/engaging in multiple visual scanning activities both in functional context standing at sink and tabletop level, drawing clock, scanning/crossing out items/letters   Perception     Praxis      Cognition Arousal/Alertness: Awake/alert Behavior During Therapy: WFL for tasks assessed/performed Overall Cognitive Status: No family/caregiver present to determine baseline cognitive functioning Area of Impairment: Problem solving;Safety/judgement;Following commands;Memory                     Memory: Decreased short-term memory Following Commands: Follows multi-step commands inconsistently Safety/Judgement: Decreased awareness of deficits   Problem Solving: Requires verbal cues General Comments: pt with decreased awareness of current deficits, decreased STM noted requiring review of signs/symptoms of stroke x2 during session (mid session and end of session)        Exercises Exercises: Other exercises Other Exercises Other Exercises: fine motor activities opening/closing containers and lids using LUE/RUE (more difficulty with LUE)   Shoulder Instructions       General Comments pt on RA, O2 sats >90%    Pertinent Vitals/ Pain       Pain Assessment: No/denies  pain  Home Living                                          Prior Functioning/Environment              Frequency  Min 2X/week        Progress Toward Goals  OT Goals(current goals can now be found in the care plan section)  Progress towards OT goals: Progressing toward goals  Acute Rehab OT Goals Patient Stated Goal: Go home today OT Goal Formulation: With  patient Time For Goal Achievement: 07/11/19 Potential to Achieve Goals: Good  Plan Discharge plan remains appropriate    Co-evaluation                 AM-PAC OT "6 Clicks" Daily Activity     Outcome Measure   Help from another person eating meals?: None Help from another person taking care of personal grooming?: A Little Help from another person toileting, which includes using toliet, bedpan, or urinal?: A Little Help from another person bathing (including washing, rinsing, drying)?: A Little Help from another person to put on and taking off regular upper body clothing?: A Little Help from another person to put on and taking off regular lower body clothing?: A Little 6 Click Score: 19    End of Session    OT Visit Diagnosis: Other (comment)(left visual field cut)   Activity Tolerance Patient tolerated treatment well   Patient Left in bed;with call bell/phone within reach;with bed alarm set   Nurse Communication Mobility status        Time: 1540-1600 OT Time Calculation (min): 20 min  Charges: OT General Charges $OT Visit: 1 Visit OT Treatments $Therapeutic Activity: 8-22 mins  Lou Cal, OT Supplemental Rehabilitation Services Pager [phone] Office [phone]    Raymondo Band 06/28/2019, 4:29 PM

## 2019-06-28 NOTE — Discharge Summary (Signed)
Physician Discharge Summary  Travis Salazar. WCB:[id number] DOB: 1936-09-07 DOA: 06/26/2019  PCP: [name], MD  Admit date: 06/26/2019 Discharge date: 06/28/2019  Admitted From: Home Disposition: Home  Recommendations for Outpatient Follow-up:  1. Follow up with PCP in 1-2 weeks 2. Follow-up with neurology in 4 to 6 weeks 3. Cardiology to arrange appointment for an event monitor. 4. Please obtain CBC/CMP at follow up 5. Please follow up on the following pending results: None  Home Health: PT/OT Equipment/Devices: Rolling walker  Discharge Condition: Stable CODE STATUS: Full code  Hospital Course: 83 year old male with history of hypertension and hyperlipidemia admitted with chest pain, somnolence, difficulty using left hand and dropping objects and found to have right MCA CVA on CT head without contrast.  He also tested positive for COVID-19.  Reportedly, patient and wife had URI symptoms for couple of weeks prior to admission which suggested he is probably towards the tail end of the disease process.  Patient did not have respiratory symptoms or oxygen requirement.  Received high-dose aspirin.  Evaluated by neurology in consultation and admitted for acute right MCA CVA.   See individual problem list below for more.  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 -Outside TPA window on arrival to ED. -CT head concerning for right MCA territory infarct. -CT perfusion demonstrating large area of penumbra -CTA head/neck without flow obstructing stenosis but atherosclerotic change especially on the right -Echo without significant finding. See below -Maintain SBP less than 180 -Plavix and high-dose aspirin for three months, then plavix indefinitely. -High dose statin -Cardiology to arrange event monitor outpatient. -PT/OT-home health PT/OT and rolling walker  COVID-19 infection: both  patient and wife are respiratory symptoms 2 weeks ago.  Wife tested positive for COVID on 06/21/2009.  I believe he is on the tail end of his COVID infection.  CXR without acute disease.  Elevation of some inflammatory markers particularly d-dimer which is greater than 20.  I am hesitant to anticoagulate while he is on DAPT out of concern for hemorrhagic conversion of his CVA.  Low suspicion for PE without tachycardia or hypoxemia.  -Discharged with COVID precautions and self monitoring instructions  Hypertension: Normotensive -Keep SBP less than 180 -Resumed home med on discharge  Dyslipidemia: LDL 121.  HDL 22.  -Continue high-dose statin  Prediabetes: A1c 6.3%. -Encourage lifestyle  Discharge Instructions   Allergies as of 06/28/2019   No Known Allergies     Medication List    STOP taking these medications   aspirin 325 MG tablet Replaced by: aspirin 325 MG EC tablet     TAKE these medications   allopurinol 300 MG tablet Commonly known as: ZYLOPRIM TAKE 1 TABLET BY MOUTH EVERY DAY   amLODipine 10 MG tablet Commonly known as: NORVASC TAKE 1 TABLET BY MOUTH EVERY DAY   aspirin 325 MG EC tablet Take 1 tablet (325 mg total) by mouth daily. Start taking on: June 29, 2019 Replaces: aspirin 325 MG tablet   atorvastatin 40 MG tablet Commonly known as: LIPITOR Take 1 tablet (40 mg total) by mouth daily at 6 PM. What changed:   medication strength  how much to take  when to take this   clopidogrel 75 MG tablet Commonly known as: PLAVIX Take 1 tablet (75 mg total) by mouth daily. Start taking on: June 29, 2019   colchicine 0.6 MG tablet Take 1 tablet (0.6 mg total) by mouth daily. What  changed:   when to take this  reasons to take this   ketorolac 0.5 % ophthalmic solution Commonly known as: ACULAR Place 1 drop into both eyes daily.   lisinopril 40 MG tablet Commonly known as: ZESTRIL TAKE 1 TABLET BY MOUTH EVERY DAY   Simbrinza 1-0.2 % Susp Generic  drug: Brinzolamide-Brimonidine Place 1 drop into both eyes 3 (three) times daily.            Durable Medical Equipment  (From admission, onward)         Start     Ordered   06/28/19 1447  For home use only DME Walker rolling  Once    Comments: 5 inch wheels  Question:  Patient needs a walker to treat with the following condition  Answer:  Unsteady gait   06/28/19 1447         Follow-up Information    [name], MD. Schedule an appointment as soon as possible for a visit in 1 week(s).   Specialty: Family Medicine Contact information: [address] [place] [address] [phone]        [place]. Schedule an appointment as soon as possible for a visit in 4 week(s).   Contact information: [address]     [place] [id number] [phone]          Consultations:  Neurology  Cardiology for an event monitor  Procedures/Studies: 2D Echo: The left ventricle has normal systolic function, with an ejection fraction of 55-60%. The cavity size was normal. Left ventricular diastolic Doppler parameters are consistent with pseudonormal. No evidence of left ventricular regional wall motion  abnormalities.  2. The right ventricle has normal systolc function. The cavity was normal. There is no increase in right ventricular wall thickness.  3. There is mild to moderate mitral annular calcification present. No evidence of mitral valve stenosis. Mild mitral regurgitation.  4. The aortic valve is tricuspid Severe calcifcation of the aortic valve. Mild-moderate stenosis of the aortic valve. Mean gradient 16 mmHg with calculated AVA 1.17 cm^2.  5. Normal IVC size. PA systolic pressure 51 mmHg.  6. The aortic root is normal in size and structure.  Ct Angio Head W Or Wo Contrast  Result Date: 06/26/2019 CLINICAL DATA:  Acute onset of left-sided weakness. Abnormal CT of the head. EXAM: CT ANGIOGRAPHY  HEAD AND NECK CT PERFUSION BRAIN TECHNIQUE: Multidetector CT imaging of the head and neck was performed using the standard protocol during bolus administration of intravenous contrast. Multiplanar CT image reconstructions and MIPs were obtained to evaluate the vascular anatomy. Carotid stenosis measurements (when applicable) are obtained utilizing NASCET criteria, using the distal internal carotid diameter as the denominator. Multiphase CT imaging of the brain was performed following IV bolus contrast injection. Subsequent parametric perfusion maps were calculated using RAPID software. CONTRAST:  7mL OMNIPAQUE IOHEXOL 350 MG/ML SOLN COMPARISON:  CT head without contrast 06/26/2019 FINDINGS: CTA NECK FINDINGS Aortic arch: Atherosclerotic calcifications are present at the aortic arch. There is no aneurysm. No significant stenosis is present at the origins the great vessels. A standard three-vessel arch configuration is present. Right carotid system: There is some tortuosity of the cervical right ICA without a significant stenosis. Atherosclerotic changes are noted at the right carotid bifurcation without a significant stenosis. There is tortuosity and additional mural calcifications in the distal cervical right ICA without significant stenosis. Left carotid system: The left common carotid artery within normal limits. There is artifact  the thoracic inlet. Mild atherosclerotic changes are present in the distal cervical left common carotid artery without a significant luminal stenosis relative to the more distal vessel. Atherosclerotic changes are present at the left carotid bifurcation and proximal left ICA without a significant stenosis relative to the more distal vessel. There is tortuosity and additional distal mural calcification without significant stenosis. Vertebral arteries: The vertebral arteries originate from the subclavian arteries bilaterally. Atherosclerotic calcifications are present bilaterally. A  60-70% stenosis is present at the origin of the left vertebral artery, the dominant vessel. There is focal stenosis of the right vertebral artery at the C6 level as the vessel enters the spinal canal. Additional segmental narrowing is present throughout the right vertebral artery in the neck. Other significant stenoses are present in the left vertebral artery in the neck. Skeleton: Multilevel degenerative changes present throughout the cervical spine. There straightening of cervical lordosis. Endplate changes and uncovertebral spurring is most evident at C3-4, C5-6, and C6-7. No focal lytic or blastic lesions are present. Other neck: A calcified nodule in the right lobe of the thyroid measures 10 mm. No other focal thyroid lesions are present. No focal mucosal or submucosal lesions are present. Salivary glands are normal. No significant adenopathy is present. Upper chest: A pleural based nodule in the right upper lobe measures 6 mm. Airspace opacification is noted peripherally in the upper lobes bilaterally. No other discrete nodule is present. Review of the MIP images confirms the above findings CTA HEAD FINDINGS Anterior circulation: Atherosclerotic calcifications are present within the cavernous right internal carotid artery. There is no significant stenosis relative to the more distal vessel. Similar calcifications are present in the cavernous left internal carotid artery without a significant stenosis relative to the distal vessel. The ICA termini are within normal limits bilaterally. The A1 segments are normal. There is an early bifurcation the right M1 segment. There is mild narrowing of the proximal M2 segments bilaterally. Diffuse asymmetric attenuation right MCA branch vessels is present without a significant proximal stenosis or occlusion Posterior circulation: The left vertebral artery is the dominant vessel. There is moderate narrowing of the right V4 segment without occlusion. Vertebrobasilar junction  is within normal limits. The basilar artery is small. There is a moderate proximal stenosis in the mid basilar artery. Left posterior cerebral artery originates from basilar tip. The right posterior cerebral artery is of fetal type. There is moderate attenuation of distal PCA branch vessels bilaterally without a significant proximal stenosis or occlusion. Venous sinuses: The dural sinuses are patent. The right transverse sinus is dominant. Straight sinus deep cerebral veins are intact. Cortical veins are unremarkable. Anatomic variants: Fetal type right posterior cerebral artery. Review of the MIP images confirms the above findings CT Brain Perfusion Findings: ASPECTS: 5/10 CBF (<30%) Volume: 59mL Perfusion (Tmax>6.0s) volume: 69mL Mismatch Volume: 58mL Infarction Location:Right MCA territory IMPRESSION: 1. CT perfusion results are discordant from the noncontrast head CT. This likely represents pseudo normalization. Noncontrast head CT demonstrates a large right MCA territory infarct with an aspects score of 5/10. 2. No emergent large vessel occlusion. 3. Asymmetric atherosclerotic changes in the right MCA branch vessels including proximal M2 segments corresponding to the infarct territory. 4. Atherosclerotic changes involving the aortic arch, carotid bifurcations, and cavernous internal carotid arteries bilaterally without significant stenosis. 5. Multilevel degenerative changes the cervical spine. 6. 6 mm pleural-based right upper lobe pulmonary nodule. Additional airspace opacification is present laterally in the upper lobes bilaterally. This may be related to fibrotic change. Follow-up non-contrast  CT recommended at 3-6 months to confirm persistence. If unchanged, and solid component remains <6 mm, annual CT is recommended until 5 years of stability has been established. If persistent these nodules should be considered highly suspicious if the solid component of the nodule is 6 mm or greater in size and  enlarging. This recommendation follows the consensus statement: Guidelines for Management of Incidental Pulmonary Nodules Detected on CT Images: From the Fleischner Society 2017; Radiology 2017; 284:228-243. Electronically Signed   By: San Morelle M.D.   On: 06/26/2019 12:58   Ct Angio Neck W Or Wo Contrast  Result Date: 06/26/2019 CLINICAL DATA:  Acute onset of left-sided weakness. Abnormal CT of the head. EXAM: CT ANGIOGRAPHY HEAD AND NECK CT PERFUSION BRAIN TECHNIQUE: Multidetector CT imaging of the head and neck was performed using the standard protocol during bolus administration of intravenous contrast. Multiplanar CT image reconstructions and MIPs were obtained to evaluate the vascular anatomy. Carotid stenosis measurements (when applicable) are obtained utilizing NASCET criteria, using the distal internal carotid diameter as the denominator. Multiphase CT imaging of the brain was performed following IV bolus contrast injection. Subsequent parametric perfusion maps were calculated using RAPID software. CONTRAST:  36mL OMNIPAQUE IOHEXOL 350 MG/ML SOLN COMPARISON:  CT head without contrast 06/26/2019 FINDINGS: CTA NECK FINDINGS Aortic arch: Atherosclerotic calcifications are present at the aortic arch. There is no aneurysm. No significant stenosis is present at the origins the great vessels. A standard three-vessel arch configuration is present. Right carotid system: There is some tortuosity of the cervical right ICA without a significant stenosis. Atherosclerotic changes are noted at the right carotid bifurcation without a significant stenosis. There is tortuosity and additional mural calcifications in the distal cervical right ICA without significant stenosis. Left carotid system: The left common carotid artery within normal limits. There is artifact the thoracic inlet. Mild atherosclerotic changes are present in the distal cervical left common carotid artery without a significant luminal  stenosis relative to the more distal vessel. Atherosclerotic changes are present at the left carotid bifurcation and proximal left ICA without a significant stenosis relative to the more distal vessel. There is tortuosity and additional distal mural calcification without significant stenosis. Vertebral arteries: The vertebral arteries originate from the subclavian arteries bilaterally. Atherosclerotic calcifications are present bilaterally. A 60-70% stenosis is present at the origin of the left vertebral artery, the dominant vessel. There is focal stenosis of the right vertebral artery at the C6 level as the vessel enters the spinal canal. Additional segmental narrowing is present throughout the right vertebral artery in the neck. Other significant stenoses are present in the left vertebral artery in the neck. Skeleton: Multilevel degenerative changes present throughout the cervical spine. There straightening of cervical lordosis. Endplate changes and uncovertebral spurring is most evident at C3-4, C5-6, and C6-7. No focal lytic or blastic lesions are present. Other neck: A calcified nodule in the right lobe of the thyroid measures 10 mm. No other focal thyroid lesions are present. No focal mucosal or submucosal lesions are present. Salivary glands are normal. No significant adenopathy is present. Upper chest: A pleural based nodule in the right upper lobe measures 6 mm. Airspace opacification is noted peripherally in the upper lobes bilaterally. No other discrete nodule is present. Review of the MIP images confirms the above findings CTA HEAD FINDINGS Anterior circulation: Atherosclerotic calcifications are present within the cavernous right internal carotid artery. There is no significant stenosis relative to the more distal vessel. Similar calcifications are present in the cavernous  left internal carotid artery without a significant stenosis relative to the distal vessel. The ICA termini are within normal limits  bilaterally. The A1 segments are normal. There is an early bifurcation the right M1 segment. There is mild narrowing of the proximal M2 segments bilaterally. Diffuse asymmetric attenuation right MCA branch vessels is present without a significant proximal stenosis or occlusion Posterior circulation: The left vertebral artery is the dominant vessel. There is moderate narrowing of the right V4 segment without occlusion. Vertebrobasilar junction is within normal limits. The basilar artery is small. There is a moderate proximal stenosis in the mid basilar artery. Left posterior cerebral artery originates from basilar tip. The right posterior cerebral artery is of fetal type. There is moderate attenuation of distal PCA branch vessels bilaterally without a significant proximal stenosis or occlusion. Venous sinuses: The dural sinuses are patent. The right transverse sinus is dominant. Straight sinus deep cerebral veins are intact. Cortical veins are unremarkable. Anatomic variants: Fetal type right posterior cerebral artery. Review of the MIP images confirms the above findings CT Brain Perfusion Findings: ASPECTS: 5/10 CBF (<30%) Volume: 15mL Perfusion (Tmax>6.0s) volume: 80mL Mismatch Volume: 48mL Infarction Location:Right MCA territory IMPRESSION: 1. CT perfusion results are discordant from the noncontrast head CT. This likely represents pseudo normalization. Noncontrast head CT demonstrates a large right MCA territory infarct with an aspects score of 5/10. 2. No emergent large vessel occlusion. 3. Asymmetric atherosclerotic changes in the right MCA branch vessels including proximal M2 segments corresponding to the infarct territory. 4. Atherosclerotic changes involving the aortic arch, carotid bifurcations, and cavernous internal carotid arteries bilaterally without significant stenosis. 5. Multilevel degenerative changes the cervical spine. 6. 6 mm pleural-based right upper lobe pulmonary nodule. Additional airspace  opacification is present laterally in the upper lobes bilaterally. This may be related to fibrotic change. Follow-up non-contrast CT recommended at 3-6 months to confirm persistence. If unchanged, and solid component remains <6 mm, annual CT is recommended until 5 years of stability has been established. If persistent these nodules should be considered highly suspicious if the solid component of the nodule is 6 mm or greater in size and enlarging. This recommendation follows the consensus statement: Guidelines for Management of Incidental Pulmonary Nodules Detected on CT Images: From the Fleischner Society 2017; Radiology 2017; 284:228-243. Electronically Signed   By: San Morelle M.D.   On: 06/26/2019 12:58   Ct Cerebral Perfusion W Contrast  Result Date: 06/26/2019 CLINICAL DATA:  Acute onset of left-sided weakness. Abnormal CT of the head. EXAM: CT ANGIOGRAPHY HEAD AND NECK CT PERFUSION BRAIN TECHNIQUE: Multidetector CT imaging of the head and neck was performed using the standard protocol during bolus administration of intravenous contrast. Multiplanar CT image reconstructions and MIPs were obtained to evaluate the vascular anatomy. Carotid stenosis measurements (when applicable) are obtained utilizing NASCET criteria, using the distal internal carotid diameter as the denominator. Multiphase CT imaging of the brain was performed following IV bolus contrast injection. Subsequent parametric perfusion maps were calculated using RAPID software. CONTRAST:  97mL OMNIPAQUE IOHEXOL 350 MG/ML SOLN COMPARISON:  CT head without contrast 06/26/2019 FINDINGS: CTA NECK FINDINGS Aortic arch: Atherosclerotic calcifications are present at the aortic arch. There is no aneurysm. No significant stenosis is present at the origins the great vessels. A standard three-vessel arch configuration is present. Right carotid system: There is some tortuosity of the cervical right ICA without a significant stenosis. Atherosclerotic  changes are noted at the right carotid bifurcation without a significant stenosis. There is tortuosity and additional  mural calcifications in the distal cervical right ICA without significant stenosis. Left carotid system: The left common carotid artery within normal limits. There is artifact the thoracic inlet. Mild atherosclerotic changes are present in the distal cervical left common carotid artery without a significant luminal stenosis relative to the more distal vessel. Atherosclerotic changes are present at the left carotid bifurcation and proximal left ICA without a significant stenosis relative to the more distal vessel. There is tortuosity and additional distal mural calcification without significant stenosis. Vertebral arteries: The vertebral arteries originate from the subclavian arteries bilaterally. Atherosclerotic calcifications are present bilaterally. A 60-70% stenosis is present at the origin of the left vertebral artery, the dominant vessel. There is focal stenosis of the right vertebral artery at the C6 level as the vessel enters the spinal canal. Additional segmental narrowing is present throughout the right vertebral artery in the neck. Other significant stenoses are present in the left vertebral artery in the neck. Skeleton: Multilevel degenerative changes present throughout the cervical spine. There straightening of cervical lordosis. Endplate changes and uncovertebral spurring is most evident at C3-4, C5-6, and C6-7. No focal lytic or blastic lesions are present. Other neck: A calcified nodule in the right lobe of the thyroid measures 10 mm. No other focal thyroid lesions are present. No focal mucosal or submucosal lesions are present. Salivary glands are normal. No significant adenopathy is present. Upper chest: A pleural based nodule in the right upper lobe measures 6 mm. Airspace opacification is noted peripherally in the upper lobes bilaterally. No other discrete nodule is present. Review  of the MIP images confirms the above findings CTA HEAD FINDINGS Anterior circulation: Atherosclerotic calcifications are present within the cavernous right internal carotid artery. There is no significant stenosis relative to the more distal vessel. Similar calcifications are present in the cavernous left internal carotid artery without a significant stenosis relative to the distal vessel. The ICA termini are within normal limits bilaterally. The A1 segments are normal. There is an early bifurcation the right M1 segment. There is mild narrowing of the proximal M2 segments bilaterally. Diffuse asymmetric attenuation right MCA branch vessels is present without a significant proximal stenosis or occlusion Posterior circulation: The left vertebral artery is the dominant vessel. There is moderate narrowing of the right V4 segment without occlusion. Vertebrobasilar junction is within normal limits. The basilar artery is small. There is a moderate proximal stenosis in the mid basilar artery. Left posterior cerebral artery originates from basilar tip. The right posterior cerebral artery is of fetal type. There is moderate attenuation of distal PCA branch vessels bilaterally without a significant proximal stenosis or occlusion. Venous sinuses: The dural sinuses are patent. The right transverse sinus is dominant. Straight sinus deep cerebral veins are intact. Cortical veins are unremarkable. Anatomic variants: Fetal type right posterior cerebral artery. Review of the MIP images confirms the above findings CT Brain Perfusion Findings: ASPECTS: 5/10 CBF (<30%) Volume: 57mL Perfusion (Tmax>6.0s) volume: 42mL Mismatch Volume: 61mL Infarction Location:Right MCA territory IMPRESSION: 1. CT perfusion results are discordant from the noncontrast head CT. This likely represents pseudo normalization. Noncontrast head CT demonstrates a large right MCA territory infarct with an aspects score of 5/10. 2. No emergent large vessel occlusion.  3. Asymmetric atherosclerotic changes in the right MCA branch vessels including proximal M2 segments corresponding to the infarct territory. 4. Atherosclerotic changes involving the aortic arch, carotid bifurcations, and cavernous internal carotid arteries bilaterally without significant stenosis. 5. Multilevel degenerative changes the cervical spine. 6. 6 mm pleural-based  right upper lobe pulmonary nodule. Additional airspace opacification is present laterally in the upper lobes bilaterally. This may be related to fibrotic change. Follow-up non-contrast CT recommended at 3-6 months to confirm persistence. If unchanged, and solid component remains <6 mm, annual CT is recommended until 5 years of stability has been established. If persistent these nodules should be considered highly suspicious if the solid component of the nodule is 6 mm or greater in size and enlarging. This recommendation follows the consensus statement: Guidelines for Management of Incidental Pulmonary Nodules Detected on CT Images: From the Fleischner Society 2017; Radiology 2017; 284:228-243. Electronically Signed   By: San Morelle M.D.   On: 06/26/2019 12:58   Dg Chest Port 1 View  Result Date: 06/27/2019 CLINICAL DATA:  COVID-19 positive patient. EXAM: PORTABLE CHEST 1 VIEW COMPARISON:  None. FINDINGS: Mild subsegmental atelectasis is seen in the left lung base. The lungs are otherwise clear. Heart size is normal. Aortic atherosclerosis is noted. No pneumothorax or pleural effusion. No acute or focal bony abnormality. IMPRESSION: No acute disease. Mild subsegmental atelectasis left lung base noted. Atherosclerosis. Electronically Signed   By: Inge Rise M.D.   On: 06/27/2019 13:49   Ct Head Code Stroke Wo Contrast  Result Date: 06/26/2019 CLINICAL DATA:  Code stroke.  Acute onset of left-sided weakness. EXAM: CT HEAD WITHOUT CONTRAST TECHNIQUE: Contiguous axial images were obtained from the base of the skull through the  vertex without intravenous contrast. COMPARISON:  None FINDINGS: Brain: Extensive right MCA territory cortical hypoattenuation is present. There is involvement of the anterior right frontal lobe, diffuse involvement of the insular cortex, involving the superior temporal gyrus and right parietal lobe. There is partial involvement of the right lentiform nucleus. Parietal involvement extends into the super ganglionic segments. No hemorrhage is present. There is mild diffuse effacement of the sulci with some mass effect. No significant midline shift is present. Left hemisphere is within normal limits. Brainstem and cerebellum are normal. Vascular: Atherosclerotic calcifications are present within the cavernous internal carotid arteries bilaterally. No significant asymmetric vessel hyperdensity is present. Additional atherosclerotic calcifications are present at the dural margin of the left vertebral artery. Skull: Calvarium is intact. No focal lytic or blastic lesions are present. Sinuses/Orbits: The paranasal sinuses and mastoid air cells are clear. The globes and orbits are within normal limits. ASPECTS [facility] Stroke Program Early CT Score) - Ganglionic level infarction (caudate, lentiform nuclei, internal capsule, insula, M1-M3 cortex): 3/7 - Supraganglionic infarction (M4-M6 cortex): 2/3 Total score (0-10 with 10 being normal): 5/10 IMPRESSION: 1. Large right MCA territory infarct without hemorrhage. 2. Early mass effect with partial effacement the sulci but no significant midline shift. 3. Atherosclerosis 4. ASPECTS is 5/10 These results were called by telephone at the time of interpretation on 06/26/2019 at 12:18 pm to Dr. Virgel Manifold , who verbally acknowledged these results. Electronically Signed   By: San Morelle M.D.   On: 06/26/2019 12:22      Subjective: No major events overnight of this morning.  Denies headache, vision change, chest pain, dyspnea, GI or GU symptoms.  Weakness improved.   Still with diminished sensation on the left side.   Discharge Exam: Vitals:   06/28/19 0300 06/28/19 0830  BP: 129/79 129/74  Pulse: 84 79  Resp: 16 20  Temp: 98.3 F (36.8 C) 98.4 F (36.9 C)  SpO2: 97% 98%    GENERAL: No acute distress.  Appears well.  HEENT: MMM.  Vision and hearing grossly intact.  NECK: Supple.  No apparent JVD. LUNGS:  No IWOB. Good air movement bilaterally. HEART:  RRR.  Distant heart sounds but regular. ABD: Bowel sounds present. Soft. Non tender.  MSK/EXT:  Moves all extremities. No apparent deformity. No edema bilaterally. SKIN: no apparent skin lesion or wound NEURO: Awake, alert and oriented x4.  Clear speech.  Cranial nerves intact.  Motor 5/5 in right and 4+/5 in left.  Light sensation diminished in LUE and LLE.  Mild pronator drift on the left.  Finger-to-nose slightly coarse on the left.  Patellar reflex symmetric bilaterally. PSYCH: Calm. Normal affect.   The results of significant diagnostics from this hospitalization (including imaging, microbiology, ancillary and laboratory) are listed below for reference.     Microbiology: Recent Results (from the past 240 hour(s))  SARS Coronavirus 2 (CEPHEID - Performed in [place] hospital lab), Hosp Order     Status: Abnormal   Collection Time: 06/26/19 12:36 PM   Specimen: Nasopharyngeal Swab  Result Value Ref Range Status   SARS Coronavirus 2 POSITIVE (A) NEGATIVE Final    Comment: RESULT CALLED TO, READ BACK BY AND VERIFIED WITH: RN A MCKEOWN [id number] MLM (NOTE) If result is NEGATIVE SARS-CoV-2 target nucleic acids are NOT DETECTED. The SARS-CoV-2 RNA is generally detectable in upper and lower  respiratory specimens during the acute phase of infection. The lowest  concentration of SARS-CoV-2 viral copies this assay can detect is 250  copies / mL. A negative result does not preclude SARS-CoV-2 infection  and should not be used as the sole basis for treatment or other  patient management  decisions.  A negative result may occur with  improper specimen collection / handling, submission of specimen other  than nasopharyngeal swab, presence of viral mutation(s) within the  areas targeted by this assay, and inadequate number of viral copies  (<250 copies / mL). A negative result must be combined with clinical  observations, patient history, and epidemiological information. If result is POSITIVE SARS-CoV-2 target nucleic acids are DETECTED. The SARS -CoV-2 RNA is generally detectable in upper and lower  respiratory specimens during the acute phase of infection.  Positive  results are indicative of active infection with SARS-CoV-2.  Clinical  correlation with patient history and other diagnostic information is  necessary to determine patient infection status.  Positive results do  not rule out bacterial infection or co-infection with other viruses. If result is PRESUMPTIVE POSTIVE SARS-CoV-2 nucleic acids MAY BE PRESENT.   A presumptive positive result was obtained on the submitted specimen  and confirmed on repeat testing.  While 2019 novel coronavirus  (SARS-CoV-2) nucleic acids may be present in the submitted sample  additional confirmatory testing may be necessary for epidemiological  and / or clinical management purposes  to differentiate between  SARS-CoV-2 and other Sarbecovirus currently known to infect humans.  If clinically indicated additional testing with an alternate test  methodology [phone]) is advise d. The SARS-CoV-2 RNA is generally  detectable in upper and lower respiratory specimens during the acute  phase of infection. The expected result is Negative. Fact Sheet for Patients:  StrictlyIdeas.no Fact Sheet for Healthcare Providers: BankingDealers.co.za This test is not yet approved or cleared by the [place] FDA and has been authorized for detection and/or diagnosis of SARS-CoV-2 by FDA under an  Emergency Use Authorization (EUA).  This EUA will remain in effect (meaning this test can be used) for the duration of the COVID-19 declaration under Section 564(b)(1) of the Act, 21 U.S.C. section 360bbb-3(b)(1), unless the  authorization is terminated or revoked sooner. Performed at [facility], [place] [address] [place], [place] [address]      Labs: BNP (last 3 results) Recent Labs    06/27/19 1323  BNP 50.2   Basic Metabolic Panel: Recent Labs  Lab 06/26/19 1155 06/26/19 1159 06/27/19 0548 06/28/19 0822  NA 138 138 138 140  K 3.6 3.6 3.7 4.1  CL 103 104 104 106  CO2 19*  --  21* 25  GLUCOSE 132* 132* 88 106*  BUN 19 20 17 17   CREATININE 0.90 0.70 0.80 0.85  CALCIUM 9.5  --  9.2 8.7*   Liver Function Tests: Recent Labs  Lab 06/26/19 1155 06/28/19 0822  AST 24 19  ALT 23 19  ALKPHOS 42 34*  BILITOT 1.0 1.1  PROT 7.3 6.0*  ALBUMIN 3.3* 2.6*   No results for input(s): LIPASE, AMYLASE in the last 168 hours. No results for input(s): AMMONIA in the last 168 hours. CBC: Recent Labs  Lab 06/26/19 1155 06/26/19 1159 06/27/19 0548 06/28/19 0822  WBC 11.8*  --  10.4 8.9  NEUTROABS 8.2*  --   --  5.7  HGB 14.4 15.6 13.2 12.2*  HCT 45.8 46.0 41.2 37.5*  MCV 92.9  --  91.8 91.5  PLT 133*  --  141* 146*   Cardiac Enzymes: Recent Labs  Lab 06/27/19 1323  CKTOTAL 77  CKMB 3.2   BNP: Invalid input(s): POCBNP CBG: Recent Labs  Lab 06/26/19 1154  GLUCAP 129*   D-Dimer Recent Labs    06/27/19 1323 06/28/19 0822  DDIMER >20.00* >20.00*   Hgb A1c Recent Labs    06/27/19 0548  HGBA1C 6.3*   Lipid Profile Recent Labs    06/27/19 0548  CHOL 168  HDL 22*  LDLCALC 121*  TRIG 126  CHOLHDL 7.6   Thyroid function studies No results for input(s): TSH, T4TOTAL, T3FREE, THYROIDAB in the last 72 hours.  Invalid input(s): FREET3 Anemia work up Recent Labs    06/27/19 1323 06/28/19 0822  FERRITIN 393* 403*   Urinalysis No results found  for: COLORURINE, APPEARANCEUR, LABSPEC, PHURINE, GLUCOSEU, HGBUR, BILIRUBINUR, KETONESUR, PROTEINUR, UROBILINOGEN, NITRITE, LEUKOCYTESUR Sepsis Labs Invalid input(s): PROCALCITONIN,  WBC,  LACTICIDVEN   Time coordinating discharge: 40 minutes  SIGNED:  Mercy Riding, MD  [facility] 06/28/2019, 2:58 PM  If 7PM-7AM, please contact night-coverage [email or web address] Password [email or web address]

## 2019-06-28 NOTE — TOC Transition Note (Signed)
Transition of Care [facility]) - CM/SW Discharge Note   Patient Details  Name: Travis Salazar. MRN: [id number] Date of Birth: Jun 13, 1936  Transition of Care [facility]) CM/SW Contact:  Zenon Mayo, RN Phone Number: 06/28/2019, 4:30 PM   Clinical Narrative:    From home with daughter and son, he will need HHPT, HHOT, he states he has no preference for [facility] agency,referral made to [facility] with Bea Graff was able to take patient and he is in agreement.  Soc will begin 24-48 hrs post dc.  Patient states he does not need a walker he has one at home.   Final next level of care: [place] Barriers to Discharge: No Barriers Identified   Patient Goals and CMS Choice Patient states their goals for this hospitalization and ongoing recovery are:: go home CMS Medicare.gov Compare Post Acute Care list provided to:: Patient Choice offered to / list presented to : Patient  Discharge Placement                       Discharge Plan and Services                DME Arranged: (NA)         HH Arranged: PT, OT HH Agency: [place] Date [place]: 06/28/19 Time [place]: 1629 Representative spoke with at [place]: [place] ([place]) Interventions     Readmission Risk Interventions No flowsheet data found.

## 2019-06-28 NOTE — Telephone Encounter (Signed)
Hospital called to schedule pt for a 1 week hospital f/u. Pt is being discharged today and is Covid positive. This would need to be an E-Visit correct? Thanks TNP

## 2019-06-28 NOTE — Telephone Encounter (Signed)
Please review. Thanks!  

## 2019-06-28 NOTE — Progress Notes (Signed)
  Echocardiogram 2D Echocardiogram has been performed.  Johny Chess 06/28/2019, 6:20 PM

## 2019-06-28 NOTE — Progress Notes (Signed)
PROGRESS NOTE  Travis Salazar. JHE:[id number] DOB: 01-28-1936   PCP: Travis Banana., MD  Patient is from: Home  DOA: 06/26/2019 LOS: 2  Brief Narrative / Interim history: 83 year old male with history of hypertension and hyperlipidemia admitted with chest pain, somnolence, difficulty using left hand and dropping objects and found to have right MCA CVA on CT head without contrast. He also tested positive for COVID-19. Reportedly,patient and wife had URI symptoms for couple of weeks prior to admission which suggested he is probably towards the tail end of the disease process. Patient did not have respiratory symptoms or oxygen requirement.  Received high-dose aspirin.  Evaluated by neurology in consultation and admitted for acute right MCA CVA.   See individual problem list below for more.  Subjective: No major events overnight of this morning.  Denies headache, vision change, chest pain, dyspnea, GI or GU symptoms.  Weakness improved.  Still with diminished sensation on the left side.  Objective: Vitals:   06/27/19 1901 06/28/19 0300 06/28/19 0830 06/28/19 1618  BP: 132/77 129/79 129/74 127/77  Pulse: 92 84 79 91  Resp: 18 16 20 20   Temp: 99.4 F (37.4 C) 98.3 F (36.8 C) 98.4 F (36.9 C) 98.6 F (37 C)  TempSrc: Oral Oral Oral Oral  SpO2: 99% 97% 98% 100%  Weight:      Height:        Intake/Output Summary (Last 24 hours) at 06/28/2019 1654 Last data filed at 06/28/2019 1500 Gross per 24 hour  Intake 1579.43 ml  Output 500 ml  Net 1079.43 ml   Filed Weights   06/26/19 1226 06/26/19 2124 06/26/19 2354  Weight: 100.2 kg 89.2 kg 89.2 kg    Examination:  GENERAL: No acute distress.  Appears well.  HEENT: MMM.  Vision and hearing grossly intact.  NECK: Supple.  No apparent JVD.  RESP:  No IWOB. Good air movement bilaterally. CVS:  RRR.  Distant heart sounds but regular. ABD/GI/GU: Bowel sounds present. Soft. Non tender.  MSK/EXT:  Moves extremities. No  apparent deformity or edema.  SKIN: no apparent skin lesion or wound NEURO: Awake, alert and oriented appropriately.  Speech clear.  Cranial nerves intact.  Motor 5/5 in right and 4+/5 in left. Sensation diminished in LUE and LLE.  Mild pronator drift on the left.  Finger-to-nose slightly coarse on the left.  Patellar reflex symmetric. PSYCH: Calm. Normal affect.    I have personally reviewed the following labs and images:  Radiology Studies: No results found.  Microbiology: Recent Results (from the past 240 hour(s))  SARS Coronavirus 2 (CEPHEID - Performed in [place] hospital lab), Hosp Order     Status: Abnormal   Collection Time: 06/26/19 12:36 PM   Specimen: Nasopharyngeal Swab  Result Value Ref Range Status   SARS Coronavirus 2 POSITIVE (A) NEGATIVE Final    Comment: RESULT CALLED TO, READ BACK BY AND VERIFIED WITH: RN A MCKEOWN [id number] MLM (NOTE) If result is NEGATIVE SARS-CoV-2 target nucleic acids are NOT DETECTED. The SARS-CoV-2 RNA is generally detectable in upper and lower  respiratory specimens during the acute phase of infection. The lowest  concentration of SARS-CoV-2 viral copies this assay can detect is 250  copies / mL. A negative result does not preclude SARS-CoV-2 infection  and should not be used as the sole basis for treatment or other  patient management decisions.  A negative result may occur with  improper specimen collection / handling, submission of specimen other  than  nasopharyngeal swab, presence of viral mutation(s) within the  areas targeted by this assay, and inadequate number of viral copies  (<250 copies / mL). A negative result must be combined with clinical  observations, patient history, and epidemiological information. If result is POSITIVE SARS-CoV-2 target nucleic acids are DETECTED. The SARS -CoV-2 RNA is generally detectable in upper and lower  respiratory specimens during the acute phase of infection.  Positive  results are  indicative of active infection with SARS-CoV-2.  Clinical  correlation with patient history and other diagnostic information is  necessary to determine patient infection status.  Positive results do  not rule out bacterial infection or co-infection with other viruses. If result is PRESUMPTIVE POSTIVE SARS-CoV-2 nucleic acids MAY BE PRESENT.   A presumptive positive result was obtained on the submitted specimen  and confirmed on repeat testing.  While 2019 novel coronavirus  (SARS-CoV-2) nucleic acids may be present in the submitted sample  additional confirmatory testing may be necessary for epidemiological  and / or clinical management purposes  to differentiate between  SARS-CoV-2 and other Sarbecovirus currently known to infect humans.  If clinically indicated additional testing with an alternate test  methodology [phone]) is advise d. The SARS-CoV-2 RNA is generally  detectable in upper and lower respiratory specimens during the acute  phase of infection. The expected result is Negative. Fact Sheet for Patients:  StrictlyIdeas.no Fact Sheet for Healthcare Providers: BankingDealers.co.za This test is not yet approved or cleared by the [place] FDA and has been authorized for detection and/or diagnosis of SARS-CoV-2 by FDA under an Emergency Use Authorization (EUA).  This EUA will remain in effect (meaning this test can be used) for the duration of the COVID-19 declaration under Section 564(b)(1) of the Act, 21 U.S.C. section 360bbb-3(b)(1), unless the authorization is terminated or revoked sooner. Performed at [facility], [place] [address], [place], [place] [address]     Sepsis Labs: Invalid input(s): PROCALCITONIN, LACTICIDVEN  Urine analysis: No results found for: COLORURINE, APPEARANCEUR, LABSPEC, PHURINE, GLUCOSEU, HGBUR, BILIRUBINUR, KETONESUR, PROTEINUR, UROBILINOGEN, NITRITE, LEUKOCYTESUR  Anemia Panel: Recent  Labs    06/27/19 1323 06/28/19 0822  FERRITIN 393* 403*    Thyroid Function Tests: No results for input(s): TSH, T4TOTAL, FREET4, T3FREE, THYROIDAB in the last 72 hours.  Lipid Profile: Recent Labs    06/27/19 0548  CHOL 168  HDL 22*  LDLCALC 121*  TRIG 126  CHOLHDL 7.6    CBG: Recent Labs  Lab 06/26/19 1154  GLUCAP 129*    HbA1C: Recent Labs    06/27/19 0548  HGBA1C 6.3*    BNP (last 3 results): No results for input(s): PROBNP in the last 8760 hours.  Cardiac Enzymes: Recent Labs  Lab 06/27/19 1323  CKTOTAL 77  CKMB 3.2    Coagulation Profile: Recent Labs  Lab 06/26/19 1155  INR 1.4*    Liver Function Tests: Recent Labs  Lab 06/26/19 1155 06/28/19 0822  AST 24 19  ALT 23 19  ALKPHOS 42 34*  BILITOT 1.0 1.1  PROT 7.3 6.0*  ALBUMIN 3.3* 2.6*   No results for input(s): LIPASE, AMYLASE in the last 168 hours. No results for input(s): AMMONIA in the last 168 hours.  Basic Metabolic Panel: Recent Labs  Lab 06/26/19 1155 06/26/19 1159 06/27/19 0548 06/28/19 0822  NA 138 138 138 140  K 3.6 3.6 3.7 4.1  CL 103 104 104 106  CO2 19*  --  21* 25  GLUCOSE 132* 132* 88 106*  BUN 19 20 17 17   CREATININE 0.90 0.70 0.80 0.85  CALCIUM 9.5  --  9.2 8.7*   GFR: Estimated Creatinine Clearance: 76.6 mL/min (by C-G formula based on SCr of 0.85 mg/dL).  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 -CT head concerning for right MCA territory infarct. -CT perfusion demonstrating large area of penumbra -CTA head/neck without flow obstructing stenosis but  atherosclerotic change especially on the right -Maintain SBP less than 180. -Continue high-dose ASA, Plavix and statin.  -PT/OT-home health PT/OT and rolling walker. -Cleared for discharge by neurology pending echocardiogram. -Neurology recommended event monitor.  Cardiology consulted to arrange this.  COVID-19 infection: both patient and wife are respiratory symptoms 2 weeks ago.  Wife tested positive for COVID on 06/21/2009.  I believe he is at the tail end of his COVID infection.  CXR without acute finding.  Elevation of some inflammatory markers particularly d-dimer which is greater than 20.  I am hesitant to anticoagulate while he is on DAPT due to increased risk for hemorrhagic conversion.  Low suspicion for PE without tachycardia or hypoxemia. -Echocardiogram to exclude thrombus.  Hypertension: Normotensive -Keep SBP less than 180 per neurology -Resume home meds gradually.  Dyslipidemia: LDL 121.  HDL 22.  -Continue high-dose statin  Prediabetes: A1c 6.3%.  DVT prophylaxis: Subcu Lovenox Code Status: Full code Family Communication: Updated patient's wife over the phone. Disposition Plan: Discharge home with home health pending echocardiogram. Consultants: Neurology   Antimicrobials: Anti-infectives (From admission, onward)   None      Sch Meds:  Scheduled Meds:   stroke: mapping our early stages of recovery book   Does not apply Once   aspirin EC  325 mg Oral Daily   atorvastatin  40 mg Oral q1800   clopidogrel  75 mg Oral Daily   enoxaparin (LOVENOX) injection  40 mg Subcutaneous Q24H   sodium chloride flush  3 mL Intravenous Once   Continuous Infusions:  sodium chloride 75 mL/hr at 06/27/19 0558   PRN Meds:.acetaminophen **OR** acetaminophen, ondansetron **OR** ondansetron (ZOFRAN) IV   Teodoro Jeffreys T. [place]  If 7PM-7AM, please contact night-coverage [email or web address] Password [facility] 06/28/2019, 4:54 PM

## 2019-06-29 ENCOUNTER — Telehealth: Payer: Self-pay

## 2019-06-29 ENCOUNTER — Other Ambulatory Visit: Payer: Self-pay | Admitting: Neurology

## 2019-06-29 DIAGNOSIS — I63411 Cerebral infarction due to embolism of right middle cerebral artery: Secondary | ICD-10-CM

## 2019-06-29 NOTE — Plan of Care (Signed)
Pt echo has completed. EF 55-60%. His stroke work up also completed. He can be discharged from neuro standpoint.   Neurology will sign off. Please call with questions. Pt will follow up with stroke clinic NP at [facility] in about 4 weeks. Thanks for the consult.   Rosalin Hawking, MD PhD Stroke Neurology 06/29/2019 7:37 AM

## 2019-06-29 NOTE — Telephone Encounter (Signed)
I have made the 1st attempt to contact the patient or family member in charge, in order to follow up from recently being discharged from the hospital. I left a message on voicemail but I will make another attempt at a different time.  

## 2019-06-30 DIAGNOSIS — I083 Combined rheumatic disorders of mitral, aortic and tricuspid valves: Secondary | ICD-10-CM | POA: Diagnosis not present

## 2019-06-30 DIAGNOSIS — I69354 Hemiplegia and hemiparesis following cerebral infarction affecting left non-dominant side: Secondary | ICD-10-CM | POA: Diagnosis not present

## 2019-06-30 DIAGNOSIS — I10 Essential (primary) hypertension: Secondary | ICD-10-CM | POA: Diagnosis not present

## 2019-06-30 DIAGNOSIS — I700 Atherosclerosis of aorta: Secondary | ICD-10-CM | POA: Diagnosis not present

## 2019-06-30 DIAGNOSIS — U071 COVID-19: Secondary | ICD-10-CM | POA: Diagnosis not present

## 2019-06-30 NOTE — Telephone Encounter (Signed)
I have made the 2nd attempt to contact the patient or family member in charge, in order to follow up from recently being discharged from the hospital. I left a message on voicemail. 

## 2019-07-02 ENCOUNTER — Ambulatory Visit (INDEPENDENT_AMBULATORY_CARE_PROVIDER_SITE_OTHER): Payer: Medicare Other

## 2019-07-02 DIAGNOSIS — I63411 Cerebral infarction due to embolism of right middle cerebral artery: Secondary | ICD-10-CM | POA: Diagnosis not present

## 2019-07-02 DIAGNOSIS — I10 Essential (primary) hypertension: Secondary | ICD-10-CM

## 2019-07-02 DIAGNOSIS — U071 COVID-19: Secondary | ICD-10-CM

## 2019-07-02 NOTE — Chronic Care Management (AMB) (Signed)
Chronic Care Management   Follow Up Note   07/04/2019 Name: Travis Salazar. MRN: [id number] DOB: 02/17/1936  Referred by: [name], MD Reason for referral : Chronic Care Management (follow up hospital discharge/stroke)   Subjective: per wife "He is doing pretty good with his therapy"   Objective:  Assessment: Travis Salazar, Travis Salazar is an 83 year old male patient of Dr. Rosanna Randy engaged with the CCM Team 11/25/2018 for assistance with HTN management and care coordination related to needing assistance with obtaining his eye drop medication. Travis Salazar has been doing well until he, wife, and disabled son of the home contracted Covid-19. Travis Salazar suffered a stroke 06/26/2019. He was discharged earlier this week. Today CCM RN CM spoke with wife and daughter Travis Salazar to discuss his needs and to ensure patient was engaged with [facility] OT and PT and had all follow up appointments scheduled.  Review of patient status, including review of consultants reports, relevant laboratory and other test results, and collaboration with appropriate care team members and the patient's provider was performed as part of comprehensive patient evaluation and provision of chronic care management services.    Goals Addressed            This Visit's Progress   . COMPLETED: "I am having a hard time getting one of my eyedrop medications" (pt-stated)        Clinical Goals: Over the next 2 weeks, patient will verbalize plan of care related to his inability to obtain prescription eyedrop.  Interventions: Colloboration with [facility]. Left message at 1515 for Dr. Inda Coke nurse to call back.  11/25/18@ 4:40pm Spoke with Juliann Pulse at [facility]. Samples will be provided to patient. Patient notified.   11/26/18 Spoke with Vicente Males at  [id number] [place], [place] [phone] (Phone)  She will send over Pre Auth information to [facility]     . 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 He is receiving HH PT and OT. His speech is "almost normal" and he is making progress with his left sided weakness. 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 There is some concern from wife and daughter that patient may not be able to resume total care of wife. Will follow closely and refer to LCSW/Pharmacy as needed.    Current Barriers:  . Chronic Disease Management support and education needs related to recent MCA stroke/covid +  Nurse Case Manager Clinical Goal(s):  Marland Kitchen Over the next 7 days, patient will verbalize understanding of plan for stroke recovery . Over the next 30 days, patient will work with [facility] OT ,PT, and CCM RN CM to address needs related to stroke recovery . Over the next 60 days, patient will not experience hospital admission. Hospital Admissions in last 6 months = 1 . Over the next 30 days, patient will attend all scheduled medical appointments: 7/21 virtual visit with PCP, 8/19 hospital follow up with [facility] Neurology  Interventions:  . Collaborated with PCP regarding daughters need for FMLA papers to be completed . Discussed plans with wife/daughter for ongoing care management follow up and provided patient with direct contact information for care management team . Provided emotional support and reassurance to daughter and wife . Assess for health complications secondary to COVID/Stroke . Assessed for engagement  with [place] OT, PT . Reconciled medications and assessed for compliance  Patient Self Care Activities:  . Self administers medications as prescribed . Attends all scheduled provider appointments . Calls pharmacy for medication refills . Attends church or other social activities . Performs ADL's independently . Performs IADL's independently . Calls provider office for new concerns or questions   Initial goal documentation         Telephone follow up appointment with care management team member scheduled for: 1 week   Sparsh Callens E. Rollene Rotunda, RN, BSN Nurse Care Coordinator [facility] Care Management [phone]

## 2019-07-02 NOTE — Progress Notes (Signed)
This encounter was created in error - please disregard.

## 2019-07-04 NOTE — Patient Instructions (Signed)
Thank you allowing the Chronic Care Management Team to be a part of your care! It was a pleasure speaking with you today!  1. Continue to take all your medications as prescribed including your plavix and Asprin.  2. Continue to work with OT and PT 3. Keep all upcoming medical appointments with Dr. Rosanna Randy and with neurology 4. Follow CDC guideline for COVID 19 infection prevention  5. Please follow all hospital discharge instructions   CCM (Chronic Care Management) Team   Trish Fountain RN, BSN Nurse Care Coordinator  [phone]  Ruben Reason PharmD  Clinical Pharmacist  [phone]   Elliot Gurney, LCSW Clinical Social Worker [phone]  Goals Addressed            This Visit's Progress   . COMPLETED: "I am having a hard time getting one of my eyedrop medications" (pt-stated)        Clinical Goals: Over the next 2 weeks, patient will verbalize plan of care related to his inability to obtain prescription eyedrop.  Interventions: Colloboration with [facility]. Left message at 1515 for Dr. Inda Coke nurse to call back.  11/25/18@ 4:40pm Spoke with Travis Salazar at [facility]. Samples will be provided to patient. Patient notified.   11/26/18 Spoke with Travis [facility] at  [id number] [place], [place] [phone] (Phone)  She will send over Pre Auth information to [facility]     . per wife "Travis Salazar is doing good from his stroke (pt-stated)       Current Barriers:  . Chronic Disease Management support and education needs related to recent MCA stroke/covid +  Nurse Case Manager Clinical Goal(s):  Marland Kitchen Over the next 7 days, patient will verbalize understanding of plan for stroke recovery . Over the next 30 days, patient will work with [facility] OT ,PT, and CCM RN CM to address needs related to stroke recovery . Over the next 60 days, patient will not experience hospital admission. Hospital Admissions in last 6 months = 1 . Over the next 30  days, patient will attend all scheduled medical appointments: 7/21 virtual visit with PCP, 8/19 hospital follow up with [facility] Neurology  Interventions:  . Collaborated with PCP regarding daughters need for FMLA papers to be completed . Discussed plans with wife/daughter for ongoing care management follow up and provided patient with direct contact information for care management team . Provided emotional support and reassurance to daughter and wife . Assess for health complications secondary to COVID/Stroke . Assessed for engagement with [facility] OT, PT . Reconciled medications and assessed for compliance  Patient Self Care Activities:  . Self administers medications as prescribed . Attends all scheduled provider appointments . Calls pharmacy for medication refills . Attends church or other social activities . Performs ADL's independently . Performs IADL's independently . Calls provider office for new concerns or questions  Initial goal documentation        The patient's caregivers (wife and daughter) verbalized understanding of instructions provided today and declined a print copy of patient instruction materials.   Telephone follow up appointment with care management team member scheduled for: 1 week  SYMPTOMS OF A STROKE   You have any symptoms of stroke. "BE FAST" is an easy way to remember the main warning signs: ? B - Balance. Signs are dizziness, sudden trouble walking, or loss of balance. ? E - Eyes. Signs are trouble seeing or a sudden change in how you see. ? F - Face. Signs  are sudden weakness or loss of feeling of the face, or the face or eyelid drooping on one side. ? A - Arms. Signs are weakness or loss of feeling in an arm. This happens suddenly and usually on one side of the body. ? S - Speech. Signs are sudden trouble speaking, slurred speech, or trouble understanding what people say. ? T - Time. Time to call emergency services. Write down what time symptoms  started.  You have other signs of stroke, such as: ? A sudden, very bad headache with no known cause. ? Feeling sick to your stomach (nausea). ? Throwing up (vomiting). ? Jerky movements you cannot control (seizure).  SYMPTOMS OF A HEART ATTACK  What are the signs or symptoms? Symptoms of this condition include:  Chest pain. It may feel like: ? Crushing or squeezing. ? Tightness, pressure, fullness, or heaviness.  Pain in the arm, neck, jaw, back, or upper body.  Shortness of breath.  Heartburn.  Indigestion.  Nausea.  Cold sweats.  Feeling tired.  Sudden lightheadedness.

## 2019-07-05 NOTE — Telephone Encounter (Signed)
Telephone visit on 7/21 per patient request.

## 2019-07-06 ENCOUNTER — Ambulatory Visit: Payer: Self-pay

## 2019-07-06 ENCOUNTER — Other Ambulatory Visit: Payer: Self-pay

## 2019-07-06 ENCOUNTER — Ambulatory Visit (INDEPENDENT_AMBULATORY_CARE_PROVIDER_SITE_OTHER): Payer: Medicare Other | Admitting: Family Medicine

## 2019-07-06 DIAGNOSIS — I63411 Cerebral infarction due to embolism of right middle cerebral artery: Secondary | ICD-10-CM | POA: Diagnosis not present

## 2019-07-06 DIAGNOSIS — I700 Atherosclerosis of aorta: Secondary | ICD-10-CM | POA: Diagnosis not present

## 2019-07-06 DIAGNOSIS — I10 Essential (primary) hypertension: Secondary | ICD-10-CM | POA: Diagnosis not present

## 2019-07-06 DIAGNOSIS — U071 COVID-19: Secondary | ICD-10-CM | POA: Diagnosis not present

## 2019-07-06 DIAGNOSIS — I69354 Hemiplegia and hemiparesis following cerebral infarction affecting left non-dominant side: Secondary | ICD-10-CM | POA: Diagnosis not present

## 2019-07-06 DIAGNOSIS — I083 Combined rheumatic disorders of mitral, aortic and tricuspid valves: Secondary | ICD-10-CM | POA: Diagnosis not present

## 2019-07-06 NOTE — Progress Notes (Signed)
Telephone interview for delightful gentleman following CVA on 06/26/19 at which time he was diagnosed with asymptomatic Covid infection.  He had hemiparesis at time of stroke which has almost completely resolved. He denied facial weakness or speech/swallowing difficulties. Will f/u with Travis Salazar in office in 1-2 weeks. Pt also to monitor BP. 6 minutes spent on phone call. Virtual Visit via Telephone Note  I connected with Travis Salazar. on 07/06/19 at 10:00 AM EDT by telephone and verified that I am speaking with the correct person using two identifiers.  Location Patient: Home Provider: [facility]   I discussed the limitations, risks, security and privacy concerns of performing an evaluation and management service by telephone and the availability of in person appointments. I also discussed with the patient that there may be a patient responsible charge related to this service. The patient expressed understanding and agreed to proceed.   History of Present Illness: See above.Pt has recovered from July 11 CVA and asymptomatic Covid infection   Observations/Objective: Phone call.Speech/thought content normal.  Assessment and Plan: 1. Cerebrovascular accident (CVA) due to embolism of right middle cerebral artery ([place]) Will address BP/lipids in 1-2 week OV  2. COVID-19 virus infection Finish 2 week quarantine 07/10/19.   Follow Up Instructions:    I discussed the assessment and treatment plan with the patient. The patient was provided an opportunity to ask questions and all were answered. The patient agreed with the plan and demonstrated an understanding of the instructions.   The patient was advised to call back or seek an in-person evaluation if the symptoms worsen or if the condition fails to improve as anticipated.  I provided 6 minutes of non-face-to-face time during this encounter.   [name], MD

## 2019-07-06 NOTE — Chronic Care Management (AMB) (Signed)
  Chronic Care Management   Follow Up Note   07/06/2019 Name: Ronzell Laban. MRN: [id number] DOB: [birth year]  Referred by: [name], MD Reason for referral : Chronic Care Management (follow up covid/stroke)   Subjective: "That was a pretty scary ordeal but I am doing much better now"   Objective:  Assessment: Assessment: Mr. Jeral, Zick is an 83 year old male patient of Dr. Rosanna Randy engaged with the CCM Team 11/25/2018 for assistance with HTN management and care coordination related to needing assistance with obtaining his eye drop medication. Mr. Djibril has been doing well until he, wife, and disabled son of the home contracted Covid-19. Mr. Ayman suffered a stroke 06/26/2019. He was discharged earlier this week. Today CCM RN CM spoke with patient to follow up on Covid and Stroke recovery.  Review of patient status, including review of consultants reports, relevant laboratory and other test results, and collaboration with appropriate care team members and the patient's provider was performed as part of comprehensive patient evaluation and provision of chronic care management services.    Goals Addressed            This Visit's Progress   . per wife "Srikar is doing good from his stroke (pt-stated)       Mr. Schutter says he is doing very well considering he suffered from a stroke a week ago. His stroke symptoms have resolved with little to no residual. His voice is strong and he is alert and oriented x 4. He is checking his BP daily. Reports today's reading of 105/63. He  continues to participate in Physical and occupational therapy. He is taking his medications as prescribed including his plavix. He is eating well and has no underlying residual symptoms of covid.  Current Barriers:  . Chronic Disease Management support and education needs related to recent MCA stroke/covid +  Nurse Case Manager Clinical Goal(s):  Marland Kitchen Over the next 7 days, patient will verbalize  understanding of plan for stroke recovery . Over the next 30 days, patient will work with [facility] OT ,PT, and CCM RN CM to address needs related to stroke recovery . Over the next 60 days, patient will not experience hospital admission. Hospital Admissions in last 6 months = 1 . Over the next 30 days, patient will attend all scheduled medical appointments: 7/21 virtual visit with PCP, 8/19 hospital follow up with [facility] Neurology  Interventions:  . Provided emotional support and reassurance to patient . Assessed continued engagement with [facility] PT and OT . Reviewed BP log . Discussed importance of continued Covid infection prevention strategies as patient was under the impression he was not able to contract again . Assessed for continued medication adherance  Patient Self Care Activities:  . Self administers medications as prescribed . Attends all scheduled provider appointments . Calls pharmacy for medication refills . Attends church or other social activities . Performs ADL's independently . Performs IADL's independently . Calls provider office for new concerns or questions  Please see past updates related to this goal by clicking on the "Past Updates" button in the selected goal          Telephone follow up appointment with care management team member scheduled for: 2 weeks  Amiera Herzberg E. Rollene Rotunda, RN, BSN Nurse Care Coordinator [facility] Care Management [phone]

## 2019-07-07 NOTE — Patient Instructions (Addendum)
Thank you allowing the Chronic Care Management Team to be a part of your care! It was a pleasure speaking with you today!   Please continue to take your medications daily including your plavix  Continue to work with PT and OT  Continue to check your BP daily and record. If you BP is low 100s, please change positions slowly to prevent dizziness.   CCM (Chronic Care Management) Team   Trish Fountain RN, BSN Nurse Care Coordinator  [phone]  Ruben Reason PharmD  Clinical Pharmacist  [phone]   Elliot Gurney, LCSW Clinical Social Worker [phone]  Goals Addressed            This Visit's Progress   . per wife "Travis Salazar is doing good from his stroke (pt-stated)       Current Barriers:  . Chronic Disease Management support and education needs related to recent MCA stroke/covid +  Nurse Case Manager Clinical Goal(s):  Marland Kitchen Over the next 7 days, patient will verbalize understanding of plan for stroke recovery . Over the next 30 days, patient will work with [facility] OT ,PT, and CCM RN CM to address needs related to stroke recovery . Over the next 60 days, patient will not experience hospital admission. Hospital Admissions in last 6 months = 1 . Over the next 30 days, patient will attend all scheduled medical appointments: 7/21 virtual visit with PCP, 8/19 hospital follow up with [facility]  Interventions:  . Provided emotional support and reassurance to patient . Assessed continued engagement with [facility] PT and OT . Reviewed BP log . Discussed importance of continued Covid infection prevention strategies as patient was under the impression he was not able to contract again . Assessed for continued medication adherance  Patient Self Care Activities:  . Self administers medications as prescribed . Attends all scheduled provider appointments . Calls pharmacy for medication refills . Attends church or other social activities . Performs ADL's independently . Performs  IADL's independently . Calls provider office for new concerns or questions  Please see past updates related to this goal by clicking on the "Past Updates" button in the selected goal         The patient verbalized understanding of instructions provided today and declined a print copy of patient instruction materials.   Telephone follow up appointment with care management team member scheduled for: 2 weeks  SYMPTOMS OF A STROKE   You have any symptoms of stroke. "BE FAST" is an easy way to remember the main warning signs: ? B - Balance. Signs are dizziness, sudden trouble walking, or loss of balance. ? E - Eyes. Signs are trouble seeing or a sudden change in how you see. ? F - Face. Signs are sudden weakness or loss of feeling of the face, or the face or eyelid drooping on one side. ? A - Arms. Signs are weakness or loss of feeling in an arm. This happens suddenly and usually on one side of the body. ? S - Speech. Signs are sudden trouble speaking, slurred speech, or trouble understanding what people say. ? T - Time. Time to call emergency services. Write down what time symptoms started.  You have other signs of stroke, such as: ? A sudden, very bad headache with no known cause. ? Feeling sick to your stomach (nausea). ? Throwing up (vomiting). ? Jerky movements you cannot control (seizure).  SYMPTOMS OF A HEART ATTACK  What are the signs or symptoms? Symptoms of this condition include:  Chest pain. It may feel like: ? Crushing or squeezing. ? Tightness, pressure, fullness, or heaviness.  Pain in the arm, neck, jaw, back, or upper body.  Shortness of breath.  Heartburn.  Indigestion.  Nausea.  Cold sweats.  Feeling tired.  Sudden lightheadedness.

## 2019-07-08 ENCOUNTER — Telehealth: Payer: Self-pay

## 2019-07-08 DIAGNOSIS — I700 Atherosclerosis of aorta: Secondary | ICD-10-CM | POA: Diagnosis not present

## 2019-07-08 DIAGNOSIS — U071 COVID-19: Secondary | ICD-10-CM | POA: Diagnosis not present

## 2019-07-08 DIAGNOSIS — I69354 Hemiplegia and hemiparesis following cerebral infarction affecting left non-dominant side: Secondary | ICD-10-CM | POA: Diagnosis not present

## 2019-07-08 DIAGNOSIS — I10 Essential (primary) hypertension: Secondary | ICD-10-CM | POA: Diagnosis not present

## 2019-07-08 DIAGNOSIS — I083 Combined rheumatic disorders of mitral, aortic and tricuspid valves: Secondary | ICD-10-CM | POA: Diagnosis not present

## 2019-07-08 NOTE — Telephone Encounter (Signed)
Kelly from [facility] needs to discuss patient's condition.  He is recovering from [place] and was improving but today appeared to be worsening some.  He was more SOB with activity.  Sats down to 85 with activity.  Patient does not have supplemental oxygen.  He was instructed by her to seek medical attention if he got any worse than he was when she saw him.  Would like your recommendation.  Claiborne Billings [phone]

## 2019-07-14 ENCOUNTER — Other Ambulatory Visit: Payer: Self-pay | Admitting: *Deleted

## 2019-07-14 DIAGNOSIS — I083 Combined rheumatic disorders of mitral, aortic and tricuspid valves: Secondary | ICD-10-CM | POA: Diagnosis not present

## 2019-07-14 DIAGNOSIS — I10 Essential (primary) hypertension: Secondary | ICD-10-CM | POA: Diagnosis not present

## 2019-07-14 DIAGNOSIS — I700 Atherosclerosis of aorta: Secondary | ICD-10-CM | POA: Diagnosis not present

## 2019-07-14 DIAGNOSIS — I69354 Hemiplegia and hemiparesis following cerebral infarction affecting left non-dominant side: Secondary | ICD-10-CM | POA: Diagnosis not present

## 2019-07-14 DIAGNOSIS — U071 COVID-19: Secondary | ICD-10-CM | POA: Diagnosis not present

## 2019-07-14 NOTE — Patient Outreach (Signed)
Patient triggered Red on Emmi Stroke Dashboard.  Notification sent to Joylene Draft, RN

## 2019-07-14 NOTE — Patient Outreach (Signed)
[place] [facility]) Care Management  07/14/2019  Caius Silbernagel. August 16, 1936 [id number]   Telephone outreach  EMMI red Alert   Patient followed by Trish Fountain, RNCM at [place] office    Referral : 07/14/19 Referral reason : EMMI red alert  Stroke follow up question : Went to follow up appointment : Patient answer No.     Patient is a 82 year old male with recent history of CVA 06/26/2019,covid 19  Positive. Per chart review PMHx; Includes hypertension, hyperlipidemia    Successful outreach call to patient , explained purpose of the call, and auto generated  follow up call program  that he is receiving after discharge related to stroke . HIPAA confirmed by 2 identifiers.   Patient discussed that he is doing real good getting his strength back, denies dizziness or new weakness.  He discussed tolerating home therapy well anticipated home therapy visit on today.   Patient recalls having tele health visit with PCP since discharge and able to recall next in office visit on 8/6, and neurology visit on 8/19.   Patient denies any new concern, problems or questions at this time.  Explained to patient that would communicate this call with Trish Fountain his assigned nurse care coordinator.   Plan Will send Trish Fountain, RN Care Coordinator in basket message regarding this EMMI red alert follow up.   Joylene Draft, RN, [place] Management Coordinator  [phone]- Mobile [phone]- Toll Free Main Office

## 2019-07-16 DIAGNOSIS — I700 Atherosclerosis of aorta: Secondary | ICD-10-CM | POA: Diagnosis not present

## 2019-07-16 DIAGNOSIS — I083 Combined rheumatic disorders of mitral, aortic and tricuspid valves: Secondary | ICD-10-CM | POA: Diagnosis not present

## 2019-07-16 DIAGNOSIS — U071 COVID-19: Secondary | ICD-10-CM | POA: Diagnosis not present

## 2019-07-16 DIAGNOSIS — I69354 Hemiplegia and hemiparesis following cerebral infarction affecting left non-dominant side: Secondary | ICD-10-CM | POA: Diagnosis not present

## 2019-07-16 DIAGNOSIS — I10 Essential (primary) hypertension: Secondary | ICD-10-CM | POA: Diagnosis not present

## 2019-07-19 ENCOUNTER — Ambulatory Visit: Payer: Medicare Other | Admitting: Family Medicine

## 2019-07-19 DIAGNOSIS — M4050 Lordosis, unspecified, site unspecified: Secondary | ICD-10-CM

## 2019-07-19 DIAGNOSIS — I700 Atherosclerosis of aorta: Secondary | ICD-10-CM

## 2019-07-19 DIAGNOSIS — M47812 Spondylosis without myelopathy or radiculopathy, cervical region: Secondary | ICD-10-CM

## 2019-07-19 DIAGNOSIS — I69354 Hemiplegia and hemiparesis following cerebral infarction affecting left non-dominant side: Secondary | ICD-10-CM | POA: Diagnosis not present

## 2019-07-19 DIAGNOSIS — I083 Combined rheumatic disorders of mitral, aortic and tricuspid valves: Secondary | ICD-10-CM | POA: Diagnosis not present

## 2019-07-19 DIAGNOSIS — U071 COVID-19: Secondary | ICD-10-CM | POA: Diagnosis not present

## 2019-07-19 DIAGNOSIS — I10 Essential (primary) hypertension: Secondary | ICD-10-CM | POA: Diagnosis not present

## 2019-07-21 DIAGNOSIS — U071 COVID-19: Secondary | ICD-10-CM | POA: Diagnosis not present

## 2019-07-21 DIAGNOSIS — I10 Essential (primary) hypertension: Secondary | ICD-10-CM | POA: Diagnosis not present

## 2019-07-21 DIAGNOSIS — I083 Combined rheumatic disorders of mitral, aortic and tricuspid valves: Secondary | ICD-10-CM | POA: Diagnosis not present

## 2019-07-21 DIAGNOSIS — I69354 Hemiplegia and hemiparesis following cerebral infarction affecting left non-dominant side: Secondary | ICD-10-CM | POA: Diagnosis not present

## 2019-07-21 DIAGNOSIS — I700 Atherosclerosis of aorta: Secondary | ICD-10-CM | POA: Diagnosis not present

## 2019-07-22 ENCOUNTER — Ambulatory Visit (INDEPENDENT_AMBULATORY_CARE_PROVIDER_SITE_OTHER): Payer: Medicare Other

## 2019-07-22 ENCOUNTER — Other Ambulatory Visit: Payer: Self-pay

## 2019-07-22 ENCOUNTER — Encounter: Payer: Self-pay | Admitting: Family Medicine

## 2019-07-22 ENCOUNTER — Ambulatory Visit (INDEPENDENT_AMBULATORY_CARE_PROVIDER_SITE_OTHER): Payer: Medicare Other | Admitting: Family Medicine

## 2019-07-22 VITALS — BP 120/70 | HR 120 | Temp 98.5°F | Resp 16 | Ht 69.0 in | Wt 206.4 lb

## 2019-07-22 DIAGNOSIS — E7800 Pure hypercholesterolemia, unspecified: Secondary | ICD-10-CM

## 2019-07-22 DIAGNOSIS — Z0000 Encounter for general adult medical examination without abnormal findings: Secondary | ICD-10-CM

## 2019-07-22 DIAGNOSIS — I10 Essential (primary) hypertension: Secondary | ICD-10-CM

## 2019-07-22 DIAGNOSIS — I471 Supraventricular tachycardia: Secondary | ICD-10-CM

## 2019-07-22 DIAGNOSIS — I63411 Cerebral infarction due to embolism of right middle cerebral artery: Secondary | ICD-10-CM | POA: Diagnosis not present

## 2019-07-22 DIAGNOSIS — U071 COVID-19: Secondary | ICD-10-CM

## 2019-07-22 NOTE — Chronic Care Management (AMB) (Signed)
Chronic Care Management   Follow Up Note   07/22/2019 Name: Travis Salazar. MRN: [id number] DOB: 07/29/1936  Referred by: Travis Salazar., MD Reason for referral : Chronic Care Management (follow up CVA)   Subjective: "I don't have any symptoms of having a stroke"   Objective:  BP Readings from Last 3 Encounters:  07/22/19 120/70  06/28/19 127/77  11/11/18 124/72    Assessment: [name], Travis Salazar is an 83 year old male patient of Dr. Glennie Salazar with the CCM Team 11/25/2018 for assistance with HTN management and care coordination related to needing assistance with obtaining his eye drop medication. [name] has been doing well until he, wife, and disabled son of the home contracted Covid-19. [name] suffered a stroke 06/26/2019. He was discharged after a brieff hospitalization. Today CCM RN CM spoke with patient to follow up on Covid and Stroke recovery.  Review of patient status, including review of consultants reports, relevant laboratory and other test results, and collaboration with appropriate care team members and the patient's provider was performed as part of comprehensive patient evaluation and provision of chronic care management services.    Goals Addressed            This Visit's Progress   . per wife "Travis Salazar is doing good from his stroke (pt-stated)       [name] states he is doing well. He completed an in office follow up visit with PCP today. States he has not residuals from recent stroke. Therapies discontinued yesterday. He will continue to exercise daily. He is taking his medications as prescribed and eating a low sodium diet. He is checking his pressures at home and reports BP<130/80. Daughter remains in the home to provide assistance to patient, wife, and disabled son of the home as needed. Patient also received support from Travis Salazar, son who checks on patient and wife daily.   Current Barriers:  . Chronic Disease Management support and education  needs related to recent MCA stroke/covid +  Nurse Case Manager Clinical Goal(s):  Travis Salazar Kitchen Over the next 7 days, patient will verbalize understanding of plan for stroke recovery-goal met 07/22/2019 . Over the next 30 days, patient will work with Travis Salazar OT ,PT, and CCM RN CM to address needs related to stroke recovery-goal met 07/22/2019 . Over the next 60 days, patient will not experience hospital admission. Hospital Admissions in last 6 months = 1 . Over the next 30 days, patient will attend all scheduled medical appointments: 7/21 virtual visit with PCP, 8/19 hospital follow up with [facility]  Interventions:  . Provided emotional support and reassurance to patient . Assessed continued engagement with Travis Salazar PT and OT (last visit yesterday) . Reviewed BP log . Discussed importance of continued Covid infection prevention strategies as patient was under the impression he was not able to contract again . Assessed for continued medication adherance . Discussed importance of low sodium diet to maintain BP WNL . Reviewed upcoming appointment with [facility] 08/02/2019  Patient Self Care Activities:  . Self administers medications as prescribed . Attends all scheduled provider appointments . Calls pharmacy for medication refills . Attends church or other social activities . Performs ADL's independently . Performs IADL's independently . Calls provider office for new concerns or questions  Please see past updates related to this goal by clicking on the "Past Updates" button in the selected goal          Telephone follow up appointment with care management team member  scheduled for: 2 weeks   Saintclair Schroader E. Rollene Rotunda, RN, BSN Nurse Care Coordinator [facility] [phone]

## 2019-07-22 NOTE — Progress Notes (Signed)
Patient: Travis Pursifull., Male    DOB: January 28, 1936, 83 y.o.   MRN: [id number] Visit Date: 07/22/2019  Today's Provider: [name], MD   No chief complaint on file.  Subjective:     Annual physical visit Travis Sovine. is a 83 y.o. male. He feels well. He reports exercising patient states that he was involved with PT until yesterday but states that he will continue to walk. He reports he is sleeping well. He has recovered from CVA and is also recovering from Covid. He has predictable DOE and is still fatigued. -----------------------------------------------------------   Review of Systems  Constitutional: Negative.   HENT: Negative.   Eyes: Negative.   Respiratory: Negative.   Cardiovascular: Negative.   Gastrointestinal: Negative.   Endocrine: Negative.   Genitourinary: Negative.   Musculoskeletal: Positive for arthralgias.  Skin: Negative.   Allergic/Immunologic: Negative.   Neurological: Negative.   Hematological: Negative.   Psychiatric/Behavioral: Negative.   All other systems reviewed and are negative.   Social History   Socioeconomic History  . Marital status: Married    Spouse name: [name]  . Number of children: 4  . Years of education: 10th grade  . Highest education level: 9th grade  Occupational History  . Occupation: retired  Scientific laboratory technician  . Financial resource strain: Not hard at all  . Food insecurity    Worry: Never true    Inability: Never true  . Transportation needs    Medical: No    Non-medical: No  Tobacco Use  . Smoking status: Former Smoker    Packs/day: 1.00    Years: 50.00    Pack years: 50.00    Quit date: 12/15/2009    Years since quitting: 9.6  . Smokeless tobacco: Never Used  Substance and Sexual Activity  . Alcohol use: No    Alcohol/week: 0.0 standard drinks  . Drug use: No  . Sexual activity: Not on file  Lifestyle  . Physical activity    Days per week: 0 days    Minutes per session: 0 min  . Stress:  Not at all  Relationships  . Social Herbalist on phone: Patient refused    Gets together: Patient refused    Attends religious service: Patient refused    Active member of club or organization: Patient refused    Attends meetings of clubs or organizations: Patient refused    Relationship status: Patient refused  . Intimate partner violence    Fear of current or ex partner: Patient refused    Emotionally abused: Patient refused    Physically abused: Patient refused    Forced sexual activity: Patient refused  Other Topics Concern  . Not on file  Social History Narrative  . Not on file    Past Medical History:  Diagnosis Date  . Hyperlipidemia   . Hypertension      Patient Active Problem List   Diagnosis Date Noted  . CVA (cerebral vascular accident) ([place]) 06/26/2019  . Wrist swelling, right 06/03/2017  . S/P prostatectomy 10/08/2016  . Snores 10/08/2016  . Essential (primary) hypertension 09/27/2015  . History of tobacco use 09/27/2015  . Hypercholesterolemia 09/27/2015  . Blood glucose elevated 09/27/2015  . Adiposity 09/27/2015    Past Surgical History:  Procedure Laterality Date  . LIPOMA EXCISION     neck  . PROSTATECTOMY     due to prostate cancer    His family history includes COPD in his mother;  Heart attack in his father.   Current Outpatient Medications:  .  allopurinol (ZYLOPRIM) 300 MG tablet, TAKE 1 TABLET BY MOUTH EVERY DAY (Patient not taking: Reported on 07/02/2019), Disp: 90 tablet, Rfl: 3 .  amLODipine (NORVASC) 10 MG tablet, TAKE 1 TABLET BY MOUTH EVERY DAY, Disp: 90 tablet, Rfl: 3 .  aspirin EC 325 MG EC tablet, Take 1 tablet (325 mg total) by mouth daily., Disp: 90 tablet, Rfl: 1 .  atorvastatin (LIPITOR) 40 MG tablet, Take 1 tablet (40 mg total) by mouth daily at 6 PM., Disp: 90 tablet, Rfl: 1 .  clopidogrel (PLAVIX) 75 MG tablet, Take 1 tablet (75 mg total) by mouth daily., Disp: 90 tablet, Rfl: 1 .  colchicine 0.6 MG tablet, Take  1 tablet (0.6 mg total) by mouth daily. (Patient not taking: Reported on 07/02/2019), Disp: 60 tablet, Rfl: 5 .  ketorolac (ACULAR) 0.5 % ophthalmic solution, Place 1 drop into both eyes daily., Disp: , Rfl:  .  lisinopril (PRINIVIL,ZESTRIL) 40 MG tablet, TAKE 1 TABLET BY MOUTH EVERY DAY (Patient taking differently: Take 40 mg by mouth daily. ), Disp: 90 tablet, Rfl: 3 .  SIMBRINZA 1-0.2 % SUSP, Place 1 drop into both eyes 3 (three) times daily., Disp: , Rfl:   Patient Care Team: [name], MD as PCP - General (Family Medicine) [place], [place] as Consulting Physician (Optometry) Bernardo Heater, Ronda Fairly, MD as Consulting Physician (Urology) Benedetto Goad, RN as Case Manager Cathi Roan, [facility] (Pharmacist)    Objective:    Vitals: There were no vitals taken for this visit.  Physical Exam Vitals signs reviewed.  Constitutional:      Appearance: He is well-developed.  HENT:     Head: Normocephalic and atraumatic.     Right Ear: External ear normal.     Left Ear: External ear normal.     Nose: Nose normal.  Eyes:     General: No scleral icterus.    Pupils: Pupils are equal, round, and reactive to light.  Neck:     Thyroid: No thyromegaly.  Cardiovascular:     Rate and Rhythm: Normal rate and regular rhythm.     Heart sounds: Murmur present.     Comments: 2/6 murmur Pulmonary:     Effort: Pulmonary effort is normal.     Breath sounds: Normal breath sounds.  Abdominal:     Palpations: Abdomen is soft.  Lymphadenopathy:     Cervical: No cervical adenopathy.  Skin:    General: Skin is warm and dry.  Neurological:     Mental Status: He is alert and oriented to person, place, and time.  Psychiatric:        Behavior: Behavior normal.        Thought Content: Thought content normal.        Judgment: Judgment normal.     Activities of Daily Living In your present state of health, do you have any difficulty performing the following activities: 06/26/2019 06/26/2019   Hearing? N N  Vision? Tempie Donning  Comment wears glasses wears glasses  Difficulty concentrating or making decisions? N N  Walking or climbing stairs? N N  Dressing or bathing? N N  Doing errands, shopping? N -  Preparing Food and eating ? - -  Using the Toilet? - -  In the past six months, have you accidently leaked urine? - -  Do you have problems with loss of bowel control? - -  Managing your Medications? - -  Managing your Finances? - -  Housekeeping or managing your Housekeeping? - -  Some recent data might be hidden    Fall Risk Assessment Fall Risk  04/14/2019 11/25/2018 04/07/2018 10/08/2016 04/04/2016  Falls in the past year? 0 0 No No No     Depression Screen PHQ 2/9 Scores 04/14/2019 04/07/2018 04/07/2018 10/08/2016  PHQ - 2 Score 0 0 0 0  PHQ- 9 Score 0 0 - -    6CIT Screen 04/14/2019  What Year? 0 points  What month? 0 points  What time? 0 points  Count back from 20 0 points  Months in reverse 0 points  Repeat phrase 4 points  Total Score 4    ECG rhythm strip reveals NSR with rate of about 90.  Assessment & Plan:     Annual Physical Visit  Reviewed patient's Family Medical History Reviewed and updated list of patient's medical providers Assessment of cognitive impairment was done Assessed patient's functional ability Established a written schedule for health screening [place] Completed and Reviewed  Exercise Activities and Dietary recommendations Goals    . DIET - REDUCE SUGAR INTAKE     Recommend cutting back on daily desserts to a couple times a week.     . Exercise 3x per week (30 min per time)     Recommend to exercise for 3 days a week for at least 30 minutes at a time.     Marland Kitchen per wife "[place] is doing good from his stroke (pt-stated)     Current Barriers:  . Chronic Disease Management support and education needs related to recent MCA stroke/covid +  Nurse Case Manager Clinical Goal(s):  Marland Kitchen Over the next 7 days, patient will  verbalize understanding of plan for stroke recovery . Over the next 30 days, patient will work with [facility] OT ,PT, and CCM RN CM to address needs related to stroke recovery . Over the next 60 days, patient will not experience hospital admission. Hospital Admissions in last 6 months = 1 . Over the next 30 days, patient will attend all scheduled medical appointments: 7/21 virtual visit with PCP, 8/19 hospital follow up with [facility]  Interventions:  . Provided emotional support and reassurance to patient . Assessed continued engagement with [facility] PT and OT . Reviewed BP log . Discussed importance of continued Covid infection prevention strategies as patient was under the impression he was not able to contract again . Assessed for continued medication adherance  Patient Self Care Activities:  . Self administers medications as prescribed . Attends all scheduled provider appointments . Calls pharmacy for medication refills . Attends church or other social activities . Performs ADL's independently . Performs IADL's independently . Calls provider office for new concerns or questions  Please see past updates related to this goal by clicking on the "Past Updates" button in the selected goal         Immunization History  Administered Date(s) Administered  . Pneumococcal Conjugate-13 10/04/2014  . Pneumococcal Polysaccharide-23 01/11/2013  . Zoster 11/13/2012    Health Maintenance  Topic Date Due  . TETANUS/TDAP  10/16/1957  . INFLUENZA VACCINE  07/17/2019  . PNA vac Low Risk Adult  Completed     Discussed health benefits of physical activity, and encouraged him to engage in regular exercise appropriate for his age and condition.  1. Encounter for annual physical exam   2. Cerebrovascular accident (CVA) due to embolism of right middle cerebral artery (HCC) Clinically no residual. -  EKG 12-Lead  3. Paroxysmal supraventricular tachycardia (HCC) Symptoms fit this--now  NSR--possible symptoms due to Covid sequelae..  4. Essential (primary) hypertension   5. Hypercholesterolemia   6. COVID-19 virus infection Resolved--pt no longer hypoxic.    ------------------------------------------------------------------------------------------------------------    [name], MD  [facility] Clint Bolder as a scribe for [name], MD.,have documented all relevant documentation on the behalf of [name], MD,as directed by  [name], MD while in the presence of [name], MD.

## 2019-07-22 NOTE — Patient Instructions (Addendum)
Thank you allowing the Chronic Care Management Team to be a part of your care! It was a pleasure speaking with you today!  1. Continue to exercise daily. 2. Continue to follow a low sodium diet 3. Take all your medications as prescribed 4. Check your blood pressure daily and record  CCM (Chronic Care Management) Team   Trish Fountain RN, BSN Nurse Care Coordinator  [phone]  Ruben Reason PharmD  Clinical Pharmacist  [phone]   [place], LCSW Clinical Social Worker [phone]  Goals Addressed            This Visit's Progress   . per wife "Azar is doing good from his stroke (pt-stated)       Current Barriers:  . Chronic Disease Management support and education needs related to recent MCA stroke/covid +  Nurse Case Manager Clinical Goal(s):  Marland Kitchen Over the next 7 days, patient will verbalize understanding of plan for stroke recovery-goal met 07/22/2019 . Over the next 30 days, patient will work with [facility] OT ,PT, and CCM RN CM to address needs related to stroke recovery-goal met 07/22/2019 . Over the next 60 days, patient will not experience hospital admission. Hospital Admissions in last 6 months = 1 . Over the next 30 days, patient will attend all scheduled medical appointments: 7/21 virtual visit with PCP, 8/19 hospital follow up with [facility]  Interventions:  . Provided emotional support and reassurance to patient . Assessed continued engagement with [facility] PT and OT (last visit yesterday) . Reviewed BP log . Discussed importance of continued Covid infection prevention strategies as patient was under the impression he was not able to contract again . Assessed for continued medication adherance . Discussed importance of low sodium diet to maintain BP WNL . Reviewed upcoming appointment with [facility] Neurology 08/02/2019  Patient Self Care Activities:  . Self administers medications as prescribed . Attends all scheduled provider appointments . Calls  pharmacy for medication refills . Attends church or other social activities . Performs ADL's independently . Performs IADL's independently . Calls provider office for new concerns or questions  Please see past updates related to this goal by clicking on the "Past Updates" button in the selected goal         The patient verbalized understanding of instructions provided today and declined a print copy of patient instruction materials.   Telephone follow up appointment with care management team member scheduled for: 2 weeks  SYMPTOMS OF A STROKE   You have any symptoms of stroke. "BE FAST" is an easy way to remember the main warning signs: ? B - Balance. Signs are dizziness, sudden trouble walking, or loss of balance. ? E - Eyes. Signs are trouble seeing or a sudden change in how you see. ? F - Face. Signs are sudden weakness or loss of feeling of the face, or the face or eyelid drooping on one side. ? A - Arms. Signs are weakness or loss of feeling in an arm. This happens suddenly and usually on one side of the body. ? S - Speech. Signs are sudden trouble speaking, slurred speech, or trouble understanding what people say. ? T - Time. Time to call emergency services. Write down what time symptoms started.  You have other signs of stroke, such as: ? A sudden, very bad headache with no known cause. ? Feeling sick to your stomach (nausea). ? Throwing up (vomiting). ? Jerky movements you cannot control (seizure).  SYMPTOMS OF A HEART ATTACK  What are the signs or symptoms? Symptoms of this condition include:  Chest pain. It may feel like: ? Crushing or squeezing. ? Tightness, pressure, fullness, or heaviness.  Pain in the arm, neck, jaw, back, or upper body.  Shortness of breath.  Heartburn.  Indigestion.  Nausea.  Cold sweats.  Feeling tired.  Sudden lightheadedness.

## 2019-08-04 ENCOUNTER — Encounter: Payer: Self-pay | Admitting: Adult Health

## 2019-08-04 ENCOUNTER — Ambulatory Visit (INDEPENDENT_AMBULATORY_CARE_PROVIDER_SITE_OTHER): Payer: Medicare Other | Admitting: Adult Health

## 2019-08-04 ENCOUNTER — Other Ambulatory Visit: Payer: Self-pay

## 2019-08-04 VITALS — BP 137/83 | HR 96 | Temp 98.0°F | Ht 69.0 in | Wt 203.2 lb

## 2019-08-04 DIAGNOSIS — G8194 Hemiplegia, unspecified affecting left nondominant side: Secondary | ICD-10-CM

## 2019-08-04 DIAGNOSIS — E785 Hyperlipidemia, unspecified: Secondary | ICD-10-CM

## 2019-08-04 DIAGNOSIS — I63511 Cerebral infarction due to unspecified occlusion or stenosis of right middle cerebral artery: Secondary | ICD-10-CM | POA: Diagnosis not present

## 2019-08-04 DIAGNOSIS — I10 Essential (primary) hypertension: Secondary | ICD-10-CM

## 2019-08-04 NOTE — Patient Instructions (Signed)
Continue aspirin 325 mg daily and clopidogrel 75 mg daily for an additional 2 months and then you can discontinue aspirin and continue on Plavix (clopidogrel) only at that time   Continue Lipitor (atorvastatin) 40 mg daily  -we will check cholesterol levels today and will call you with any abnormal results  You will be called to be set up with 30-day cardiac event monitor to look for potential irregular heart rhythms that could have caused her stroke  Continue to stay active and eat a healthy diet  Continue to follow up with PCP regarding cholesterol and blood pressure management   Continue to monitor blood pressure at home  Maintain strict control of hypertension with blood pressure goal below 130/90, diabetes with hemoglobin A1c goal below 6.5% and cholesterol with LDL cholesterol (bad cholesterol) goal below 70 mg/dL. I also advised the patient to eat a healthy diet with plenty of whole grains, cereals, fruits and vegetables, exercise regularly and maintain ideal body weight.  Followup in the future with me in 4 months or call earlier if needed       Thank you for coming to see [place] at [facility]. I hope we have been able to provide you high quality care today.  You may receive a patient satisfaction survey over the next few weeks. We would appreciate your feedback and comments so that we may continue to improve ourselves and the health of our patients.

## 2019-08-04 NOTE — Progress Notes (Signed)
I agree with the above plan 

## 2019-08-04 NOTE — Progress Notes (Signed)
[facility] [address] [place]. [place] [address] [phone]       HOSPITAL FOLLOW UP NOTE  [name]. Date of Birth:  28-Oct-1936 Medical Record Number:  [id number]   Reason for Referral:  hospital stroke follow up    CHIEF COMPLAINT:  Chief Complaint  Patient presents with   Follow-up    Room 9, with son. Hosp. f/u. CVA. "Been doing good"    HPI: Moua Rasmusson Jr.is being seen today for in office hospital follow-up regarding large right MCA infarct on 06/26/2019.  History obtained from patient, son and chart review. Reviewed all radiology images and labs personally.  Mr. Cadyn Fann. is a 83 y.o. male with history of HTN, and HLD who presents to [facility] ED with left side weakness, neglect and inability to swallow. CT head revealed large Right MCA infarct.   Stroke work-up revealed large right MCA infarct as evidenced on MRI embolic pattern secondary to large vessel arthrosclerosis versus occult A. fib with residual left hemianopia, left pronator drift and left neglect.  He did not receive IV t-PA due to late presentation (>4.5 hours from time of onset). He was found to be  [employer] Virus 2 positive asymptomatic.  CTA showed asymmetric arthrosclerotic changes in the right MCA branch vessels including proximal M2 segments corresponding to the infarct territory and bilateral carotid bulb and siphon arthrosclerosis but no significant stenosis.  2D echo unremarkable.  Recommended 30-day cardiac event monitor to rule out A. fib outpatient.  Recommended DAPT for 3 months then Plavix alone due to intracranial stenosis.  HTN stable.  LDL 121 and recommended increasing home dose of atorvastatin to 40 mg daily.  A1c 6.3.  Other stroke risk factors include advanced age, former tobacco use and obesity but no prior history of stroke.  Other active problems during admission include mild thrombocytopenia and 6 mm pleural-based right upper lobe pulmonary nodule and recommended  follow-up CT in 3 to 6 months outpatient.  Discharged home in stable condition with recommendation of home health PT/OT.  He has been recovering well from a stroke standpoint with residual mild left hemiparesis but has returned back to all prior activities and believes he is about 90% back to his baseline Was initially receiving home health therapies but has since been completed.  He continues to do HEP and stays active throughout the day He continues to live with his wife doing ADLs and IADLs independently Continues on aspirin 3 2 5  mg and clopidogrel without bleeding or bruising Continues on atorvastatin 40 mg daily without myalgias -has not had repeat levels since discharge Blood pressure satisfactory at 137/83 He has not underwent 30-day cardiac event monitoring at this time No further concerns at this time      ROS:   14 system review of systems performed and negative with exception of weakness  PMH:  Past Medical History:  Diagnosis Date   Hyperlipidemia    Hypertension     PSH:  Past Surgical History:  Procedure Laterality Date   LIPOMA EXCISION     neck   PROSTATECTOMY     due to prostate cancer    Social History:  Social History   Socioeconomic History   Marital status: Married    Spouse name: [name]   Number of children: 4   Years of education: 10th grade   Highest education level: 9th grade  Occupational History   Occupation: retired  Scientist, product/process development strain: Not hard  at all   Food insecurity    Worry: Never true    Inability: Never true   Transportation needs    Medical: No    Non-medical: No  Tobacco Use   Smoking status: Former Smoker    Packs/day: 1.00    Years: 50.00    Pack years: 50.00    Quit date: 12/15/2009    Years since quitting: 9.6   Smokeless tobacco: Never Used  Substance and Sexual Activity   Alcohol use: No    Alcohol/week: 0.0 standard drinks   Drug use: No   Sexual activity: Not on file   Lifestyle   Physical activity    Days per week: 0 days    Minutes per session: 0 min   Stress: Not at all  Relationships   Social connections    Talks on phone: Patient refused    Gets together: Patient refused    Attends religious service: Patient refused    Active member of club or organization: Patient refused    Attends meetings of clubs or organizations: Patient refused    Relationship status: Patient refused   Intimate partner violence    Fear of current or ex partner: Patient refused    Emotionally abused: Patient refused    Physically abused: Patient refused    Forced sexual activity: Patient refused  Other Topics Concern   Not on file  Social History Narrative   Not on file    Family History:  Family History  Problem Relation Age of Onset   COPD Mother    Heart attack Father     Medications:   Current Outpatient Medications on File Prior to Visit  Medication Sig Dispense Refill   allopurinol (ZYLOPRIM) 300 MG tablet TAKE 1 TABLET BY MOUTH EVERY DAY 90 tablet 3   amLODipine (NORVASC) 10 MG tablet TAKE 1 TABLET BY MOUTH EVERY DAY 90 tablet 3   aspirin EC 325 MG EC tablet Take 1 tablet (325 mg total) by mouth daily. 90 tablet 1   atorvastatin (LIPITOR) 40 MG tablet Take 1 tablet (40 mg total) by mouth daily at 6 PM. 90 tablet 1   clopidogrel (PLAVIX) 75 MG tablet Take 1 tablet (75 mg total) by mouth daily. 90 tablet 1   colchicine 0.6 MG tablet Take 1 tablet (0.6 mg total) by mouth daily. 60 tablet 5   ketorolac (ACULAR) 0.5 % ophthalmic solution Place 1 drop into both eyes daily.     lisinopril (PRINIVIL,ZESTRIL) 40 MG tablet TAKE 1 TABLET BY MOUTH EVERY DAY (Patient taking differently: Take 40 mg by mouth daily. ) 90 tablet 3   SIMBRINZA 1-0.2 % SUSP Place 1 drop into both eyes 3 (three) times daily.     No current facility-administered medications on file prior to visit.     Allergies:  No Known Allergies   Physical Exam  Vitals:    08/04/19 0806  BP: 137/83  Pulse: 96  Temp: 98 F (36.7 C)  Weight: 203 lb 3.2 oz (92.2 kg)  Height: 5\' 9"  (1.753 m)   Body mass index is 30.01 kg/m. No exam data present  Depression screen [facility] 2/9 08/04/2019  Decreased Interest 0  Down, Depressed, Hopeless 0  PHQ - 2 Score 0  Altered sleeping -  Tired, decreased energy -  Change in appetite -  Feeling bad or failure about yourself  -  Trouble concentrating -  Moving slowly or fidgety/restless -  Suicidal thoughts -  PHQ-9 Score -  Difficult  doing work/chores -     General: well developed, well nourished,  pleasant elderly African-American male, seated, in no evident distress Head: head normocephalic and atraumatic.   Neck: supple with no carotid or supraclavicular bruits Cardiovascular: regular rate and rhythm, no murmurs Musculoskeletal: no deformity Skin:  no rash/petichiae Vascular:  Normal pulses all extremities   Neurologic Exam Mental Status: Awake and fully alert. Oriented to place and time. Recent and remote memory intact. Attention span, concentration and fund of knowledge appropriate. Mood and affect appropriate.  Cranial Nerves: Fundoscopic exam reveals sharp disc margins. Pupils equal, briskly reactive to light. Extraocular movements full without nystagmus. Visual fields full to confrontation. Hearing intact. Facial sensation intact. Face, tongue, palate moves normally and symmetrically.  Motor: Normal bulk and tone.   LUE: 4+/5 greatest in bicep with mildly weak grip strength LLE: 4+/5 in hip flexor RUE and RLE full strength Sensory.: intact to touch , pinprick , position and vibratory sensation.  Coordination: Rapid alternating movements normal in all extremities except mildly diminished left hand. Finger-to-nose and heel-to-shin performed accurately bilaterally.  Orbits right arm over left arm Gait and Station: Arises from chair without difficulty. Stance is normal. Gait demonstrates normal stride length and  balance Reflexes: 1+ and symmetric. Toes downgoing.     NIHSS  0 Modified Rankin  1    Diagnostic Data (Labs, Imaging, Testing)  Ct Angio Head W Or Wo Contrast Ct Angio Neck W Or Wo Contrast Ct Cerebral Perfusion W Contrast 06/26/2019 IMPRESSION:  1. CT perfusion results are discordant from the noncontrast head CT. This likely represents pseudo normalization. Noncontrast head CT demonstrates a large right MCA territory infarct with an aspects score of 5/10.  2. No emergent large vessel occlusion.  3. Asymmetric atherosclerotic changes in the right MCA branch vessels including proximal M2 segments corresponding to the infarct territory.  4. Atherosclerotic changes involving the aortic arch, carotid bifurcations, and cavernous internal carotid arteries bilaterally without significant stenosis.  5. Multilevel degenerative changes the cervical spine.  6. 6 mm pleural-based right upper lobe pulmonary nodule. Additional airspace opacification is present laterally in the upper lobes bilaterally. This may be related to fibrotic change. Follow-up non-contrast CT recommended at 3-6 months to confirm persistence. If unchanged, and solid component remains <6 mm, annual CT is recommended until 5 years of stability has been established. If persistent these nodules should be considered highly suspicious if the solid component of the nodule is 6 mm or greater in size and enlarging. This recommendation follows the consensus statement: Guidelines for Management of Incidental Pulmonary Nodules Detected on CT Images: From the Fleischner Society 2017; Radiology 2017; 284:228-243.   Ct Head Code Stroke Wo Contrast 06/26/2019 IMPRESSION:  1. Large right MCA territory infarct without hemorrhage.  2. Early mass effect with partial effacement the sulci but no significant midline shift.  3. Atherosclerosis  4. ASPECTS is 5/10   ECHOCARDIOGRAM 06/28/2019 IMPRESSIONS  1. The left ventricle has normal systolic  function, with an ejection fraction of 55-60%. The cavity size was normal. Left ventricular diastolic Doppler parameters are consistent with pseudonormal. No evidence of left ventricular regional wall motion  abnormalities.  2. The right ventricle has normal systolc function. The cavity was normal. There is no increase in right ventricular wall thickness.  3. There is mild to moderate mitral annular calcification present. No evidence of mitral valve stenosis. Mild mitral regurgitation.  4. The aortic valve is tricuspid Severe calcifcation of the aortic valve. Mild-moderate stenosis of the aortic  valve. Mean gradient 16 mmHg with calculated AVA 1.17 cm^2.  5. Normal IVC size. PA systolic pressure 51 mmHg.  6. The aortic root is normal in size and structure.    ASSESSMENT: Terre Zabriskie. is a 83 y.o. year old male presented with left-sided weakness, neglect and difficulty swallowing on 06/26/2019 with stroke work-up revealing large right MCA infarct embolic pattern secondary to large vessel arthrosclerosis versus occult A. fib.  He was also found to be positive for COVID-19 asymptomatic.  Vascular risk factors include HTN, HLD and prior tobacco use.  Recovered well with only mild residual left hemiparesis but has returned back to all prior activities without difficulty    PLAN:  1. Right MCA infarct: Continue aspirin 325 mg daily and clopidogrel 75 mg daily  and atorvastatin for secondary stroke prevention.  Discontinue aspirin and continue Plavix alone after 09/28/2019 as 41-month DAPT completed at that time.  Maintain strict control of hypertension with blood pressure goal below 130/90, diabetes with hemoglobin A1c goal below 6.5% and cholesterol with LDL cholesterol (bad cholesterol) goal below 70 mg/dL.  I also advised the patient to eat a healthy diet with plenty of whole grains, cereals, fruits and vegetables, exercise regularly with at least 30 minutes of continuous activity daily and maintain  ideal body weight.  Advised to continue to do HEP as recommended during therapy for ongoing improvement 2. HTN: Advised to continue current treatment regimen.  Today's BP satisfactory at 137/83.  Advised to continue to monitor at home along with continued follow-up with PCP for management 3. HLD: Advised to continue current treatment regimen along with continued follow-up with PCP for future prescribing and monitoring of lipid panel.  Lipid panel obtained today to ensure adequate management with current dosage of statin     Follow up in 4 months or call earlier if needed   Greater than 50% of time during this 45 minute visit was spent on counseling, explanation of diagnosis of right MCA infarct, reviewing risk factor management of HTN and HLD, planning of further management along with potential future management, and discussion with patient and family answering all questions.    Frann Rider, AGNP-BC  [facility] [address] [place], [place] [address]  Phone [phone] Fax [phone] Note: This document was prepared with digital dictation and possible smart phrase technology. Any transcriptional errors that result from this process are unintentional.

## 2019-08-05 ENCOUNTER — Ambulatory Visit: Payer: Self-pay

## 2019-08-05 ENCOUNTER — Other Ambulatory Visit: Payer: Self-pay

## 2019-08-05 DIAGNOSIS — I63411 Cerebral infarction due to embolism of right middle cerebral artery: Secondary | ICD-10-CM

## 2019-08-05 DIAGNOSIS — I10 Essential (primary) hypertension: Secondary | ICD-10-CM

## 2019-08-05 LAB — LIPID PANEL
Chol/HDL Ratio: 4 ratio (ref 0.0–5.0)
Cholesterol, Total: 141 mg/dL (ref 100–199)
HDL: 35 mg/dL — ABNORMAL LOW (ref >39)
LDL Calculated: 87 mg/dL (ref 0–99)
Triglycerides: 95 mg/dL (ref 0–149)
VLDL Cholesterol Cal: 19 mg/dL (ref 5–40)

## 2019-08-05 NOTE — Chronic Care Management (AMB) (Signed)
Chronic Care Management   Follow Up Note   08/05/2019 Name: Travis Salazar. MRN: [id number] DOB: 02/07/1936  Referred by: Travis Salazar., MD Reason for referral : Chronic Care Management (follow up stroke)   Subjective: "I'm doing real good and got a good report from the neurologist yesterday"   Objective:  BP Readings from Last 3 Encounters:  08/04/19 137/83  07/22/19 120/70  06/28/19 127/77    Assessment: Mr. Travis, Salazar is an 83 year old male patient of Dr. Glennie Salazar with the CCM Team 11/25/2018 for assistance with HTN management and care coordination related to needing assistance with obtaining his eye drop medication. [name] has been doing well until he, wife, and disabled son of the home contracted Covid-19. [name] suffered a stroke 06/26/2019. He was discharged after a brieff hospitalization. Today CCM RN CM spokewith patient to follow up on stroke recovery and yesterdays neurology visit.   Review of patient status, including review of consultants reports, relevant laboratory and other test results, and collaboration with appropriate care team members and the patient's provider was performed as part of comprehensive patient evaluation and provision of chronic care management services.    Goals Addressed            This Visit's Progress    per wife "Travis Salazar is doing good from his stroke (pt-stated)       [name] states he continues to do well. He is able to complete all ADLs and IADLs independently. He is back to being able to care for his wife who is on chronic O2. He is checking his blood pressure weekly but feeling so well that he decided not to check daily. He is encouraged to check his BP several times a week and record as HTN can have no symptoms. He continues to take all medications as prescribed and trying to maintain a low sodium diet. He is prescribed a 30 day event holter monitor and will be contacted for device over the next couple of  days. He does not have to follow up with Neurology for 4 months.   Current Barriers:   Chronic Disease Management support and education needs related to recent MCA stroke/covid +  Nurse Case Manager Clinical Goal(s):   Over the next 60 days, patient will not experience hospital admission. Hospital Admissions in last 6 months = 1  Over the next 30 days, patient will attend all scheduled medical appointments: 7/21 virtual visit with PCP, 8/19 hospital follow up with [facility]-goal met 08/05/2019  Over the next 60 days, patient will check BP 3 x week and record, calling Dr. Rosanna Randy with numbers outside discussed parameters   Interventions:   Reviewed BP log  Discussed importance of continued Covid infection prevention strategies as patient was under the impression he was not able to contract again  Assessed for continued medication adherance  Discussed importance of low sodium diet to maintain BP WNL  Reviewed Neurology note from yesterdays appointment and discussed with patient  Patient Self Care Activities:   Self administers medications as prescribed  Attends all scheduled provider appointments  Calls pharmacy for medication refills  Attends church or other social activities  Performs ADL's independently  Performs IADL's independently  Calls provider office for new concerns or questions  Please see past updates related to this goal by clicking on the "Past Updates" button in the selected goal          The care management team will reach out to  the patient again over the next 60 days.  The patient has been provided with contact information for the care management team and has been advised to call with any health related questions or concerns.    Travis Salazar E. Rollene Rotunda, RN, BSN Nurse Care Coordinator [facility] [phone]

## 2019-08-05 NOTE — Patient Instructions (Signed)
Thank you allowing the Chronic Care Management Team to be a part of your care! It was a pleasure speaking with you today!  1. Continue to exercise daily. 2. Continue to follow a low sodium diet 3. Take all your medications as prescribed 4. Check your blood pressure daily and record  CCM (Chronic Care Management) Team   Trish Fountain RN, BSN Nurse Care Coordinator  [phone]  Ruben Reason PharmD  Clinical Pharmacist  [phone]   [place], LCSW Clinical Social Worker [phone]  Goals Addressed            This Visit's Progress   . per wife "Sebasthian is doing good from his stroke (pt-stated)       Current Barriers:  . Chronic Disease Management support and education needs related to recent MCA stroke/covid +  Nurse Case Manager Clinical Goal(s):  Marland Kitchen Over the next 60 days, patient will not experience hospital admission. Hospital Admissions in last 6 months = 1 . Over the next 30 days, patient will attend all scheduled medical appointments: 7/21 virtual visit with PCP, 8/19 hospital follow up with [facility]-goal met 08/05/2019 . Over the next 60 days, patient will check BP 3 x week and record, calling Dr. Rosanna Randy with numbers outside discussed parameters   Interventions:  . Reviewed BP log . Discussed importance of continued Covid infection prevention strategies as patient was under the impression he was not able to contract again . Assessed for continued medication adherance . Discussed importance of low sodium diet to maintain BP WNL . Reviewed Neurology note from yesterdays appointment and discussed with patient  Patient Self Care Activities:  . Self administers medications as prescribed . Attends all scheduled provider appointments . Calls pharmacy for medication refills . Attends church or other social activities . Performs ADL's independently . Performs IADL's independently . Calls provider office for new concerns or questions  Please see  past updates related to this goal by clicking on the "Past Updates" button in the selected goal         The patient verbalized understanding of instructions provided today and declined a print copy of patient instruction materials.   The care management team will reach out to the patient again over the next 60 days.  The patient has been provided with contact information for the care management team and has been advised to call with any health related questions or concerns.   SYMPTOMS OF A STROKE   You have any symptoms of stroke. "BE FAST" is an easy way to remember the main warning signs: ? B - Balance. Signs are dizziness, sudden trouble walking, or loss of balance. ? E - Eyes. Signs are trouble seeing or a sudden change in how you see. ? F - Face. Signs are sudden weakness or loss of feeling of the face, or the face or eyelid drooping on one side. ? A - Arms. Signs are weakness or loss of feeling in an arm. This happens suddenly and usually on one side of the body. ? S - Speech. Signs are sudden trouble speaking, slurred speech, or trouble understanding what people say. ? T - Time. Time to call emergency services. Write down what time symptoms started.  You have other signs of stroke, such as: ? A sudden, very bad headache with no known cause. ? Feeling sick to your stomach (nausea). ? Throwing up (vomiting). ? Jerky movements you cannot control (seizure).  SYMPTOMS OF A HEART ATTACK  What are  the signs or symptoms? Symptoms of this condition include:  Chest pain. It may feel like: ? Crushing or squeezing. ? Tightness, pressure, fullness, or heaviness.  Pain in the arm, neck, jaw, back, or upper body.  Shortness of breath.  Heartburn.  Indigestion.  Nausea.  Cold sweats.  Feeling tired.  Sudden lightheadedness.

## 2019-09-10 ENCOUNTER — Telehealth: Payer: Self-pay | Admitting: Nurse Practitioner

## 2019-09-10 DIAGNOSIS — I63411 Cerebral infarction due to embolism of right middle cerebral artery: Secondary | ICD-10-CM

## 2019-09-10 NOTE — Progress Notes (Deleted)
CARDIOLOGY OFFICE NOTE  Date:  09/10/2019    Travis Salazar. Date of Birth: June 09, 1936 Medical Record [id number]  PCP:  [name], MD  Cardiologist:  Servando Snare & ***    No chief complaint on file.   History of Present Illness: Travis Salazar. is a 83 y.o. male who presents today for a ***  The patient {does/does not:200015} have symptoms concerning for COVID-19 infection (fever, chills, cough, or new shortness of breath).   Comes in today. Here with   Past Medical History:  Diagnosis Date  . Hyperlipidemia   . Hypertension     Past Surgical History:  Procedure Laterality Date  . LIPOMA EXCISION     neck  . PROSTATECTOMY     due to prostate cancer     Medications: No outpatient medications have been marked as taking for the 09/14/19 encounter (Appointment) with Burtis Junes, NP.     Allergies: No Known Allergies  Social History: The patient  reports that he quit smoking about 9 years ago. He has a 50.00 pack-year smoking history. He has never used smokeless tobacco. He reports that he does not drink alcohol or use drugs.   Family History: The patient's ***family history includes COPD in his mother; Heart attack in his father.   Review of Systems: Please see the history of present illness.   All other systems are reviewed and negative.   Physical Exam: VS:  There were no vitals taken for this visit. Marland Kitchen  BMI There is no height or weight on file to calculate BMI.  Wt Readings from Last 3 Encounters:  08/04/19 203 lb 3.2 oz (92.2 kg)  07/22/19 206 lb 6.4 oz (93.6 kg)  06/26/19 196 lb 10.4 oz (89.2 kg)    General: Pleasant. Well developed, well nourished and in no acute distress.   HEENT: Normal.  Neck: Supple, no JVD, carotid bruits, or masses noted.  Cardiac: ***Regular rate and rhythm. No murmurs, rubs, or gallops. No edema.  Respiratory:  Lungs are clear to auscultation bilaterally with normal work of breathing.  GI: Soft and  nontender.  MS: No deformity or atrophy. Gait and ROM intact.  Skin: Warm and dry. Color is normal.  Neuro:  Strength and sensation are intact and no gross focal deficits noted.  Psych: Alert, appropriate and with normal affect.   LABORATORY DATA:  EKG:  EKG {ACTION; IS/IS [id number] ordered today. This demonstrates ***.  Lab Results  Component Value Date   WBC 8.9 06/28/2019   HGB 12.2 (L) 06/28/2019   HCT 37.5 (L) 06/28/2019   PLT 146 (L) 06/28/2019   GLUCOSE 106 (H) 06/28/2019   CHOL 141 08/04/2019   TRIG 95 08/04/2019   HDL 35 (L) 08/04/2019   LDLCALC 87 08/04/2019   ALT 19 06/28/2019   AST 19 06/28/2019   NA 140 06/28/2019   K 4.1 06/28/2019   CL 106 06/28/2019   CREATININE 0.85 06/28/2019   BUN 17 06/28/2019   CO2 25 06/28/2019   TSH 3.610 10/07/2018   INR 1.4 (H) 06/26/2019   HGBA1C 6.3 (H) 06/27/2019     BNP (last 3 results) Recent Labs    06/27/19 1323  BNP 64.5    ProBNP (last 3 results) No results for input(s): PROBNP in the last 8760 hours.   Other Studies Reviewed Today:   Assessment/Plan:  . COVID-19 Education: The signs and symptoms of COVID-19 were discussed with the patient and how to seek care  for testing (follow up with PCP or arrange E-visit).  The importance of social distancing, staying at home, hand hygiene and wearing a mask when out in public were discussed today.  Current medicines are reviewed with the patient today.  The patient does not have concerns regarding medicines other than what has been noted above.  The following changes have been made:  See above.  Labs/ tests ordered today include:   No orders of the defined types were placed in this encounter.    Disposition:   FU with *** in {gen number [id number] {Days to years:10300}.   Patient is agreeable to this plan and will call if any problems develop in the interim.   SignedTruitt Merle, NP  09/10/2019 8:16 AM  [place] [address] [place], [place]  [address] Phone: [phone] Fax: [phone]

## 2019-09-10 NOTE — Telephone Encounter (Signed)
Thanks for the update Tilla Wilborn, MD  

## 2019-09-10 NOTE — Telephone Encounter (Signed)
Received note from Truitt Merle, NP regarding patient's appointment scheduled for next week in our office. Patient was seen by Dr. Marlou Porch during hospitalization s/p CVA in July and the plan was for patient to get a cardiac monitor. I called and spoke with patient's wife and she verified that the patient never received a monitor. Patient is being followed by neurology and primary care and the notes by those providers also address need for cardiac monitoring. I advised that we would like to have the patient wear the monitor prior to his appointment. Cardiac event monitor order placed and I advised wife that our technician will call next week with instructions. Patient's appointment has been moved to October 29 with Dr. Marlou Porch. Patient's wife verbalized understanding and agreement with plan. She verified that her son will need to accompany the patient to the appointment due to patient's poor vision. I gave her our number and advised her to call back with any questions or if she does not receive a call next week regarding the monitor. She thanked me for the call.

## 2019-09-14 ENCOUNTER — Ambulatory Visit: Payer: Medicare Other | Admitting: Nurse Practitioner

## 2019-09-21 ENCOUNTER — Telehealth: Payer: Self-pay | Admitting: Cardiology

## 2019-09-21 ENCOUNTER — Other Ambulatory Visit: Payer: Self-pay

## 2019-09-21 ENCOUNTER — Encounter: Payer: Self-pay | Admitting: Family Medicine

## 2019-09-21 ENCOUNTER — Ambulatory Visit (INDEPENDENT_AMBULATORY_CARE_PROVIDER_SITE_OTHER): Payer: Medicare Other | Admitting: Family Medicine

## 2019-09-21 VITALS — BP 132/76 | HR 94 | Temp 97.1°F | Resp 18 | Wt 208.2 lb

## 2019-09-21 DIAGNOSIS — R739 Hyperglycemia, unspecified: Secondary | ICD-10-CM

## 2019-09-21 DIAGNOSIS — I63411 Cerebral infarction due to embolism of right middle cerebral artery: Secondary | ICD-10-CM

## 2019-09-21 DIAGNOSIS — E7800 Pure hypercholesterolemia, unspecified: Secondary | ICD-10-CM

## 2019-09-21 DIAGNOSIS — U071 COVID-19: Secondary | ICD-10-CM | POA: Diagnosis not present

## 2019-09-21 DIAGNOSIS — I10 Essential (primary) hypertension: Secondary | ICD-10-CM

## 2019-09-21 NOTE — Telephone Encounter (Signed)
[employer] to ship a 30 day cardiac event monitor to the patients home.  Instructions reviewed briefly as they are included in the monitor kit. Patient has a monitor follow up appointment with Dr. Marlou Porch on 10/14/19.  His monitor results will not be available until at least 10/25/19.  Chat message sent to Mack Guise to contact daughter, Philippa Chester at [phone] to reschedule follow up appointment.

## 2019-09-21 NOTE — Telephone Encounter (Signed)
New Message  Patient/patient's daughter is calling in about getting monitor sent to them. States that haven't received a call about it or received one via mail. Please give patient/patient's daughter a call back about monitor.

## 2019-09-21 NOTE — Progress Notes (Signed)
Patient: Travis Salazar. Male    DOB: 18-Jul-1936   83 y.o.   MRN: [id number] Visit Date: 09/21/2019  Today's Provider: [name], MD   Chief Complaint  Patient presents with  . Follow-up   Subjective:     HPI  2 Month follow up.  He has recovered completely from CVA and covid. No Known Allergies   Current Outpatient Medications:  .  allopurinol (ZYLOPRIM) 300 MG tablet, TAKE 1 TABLET BY MOUTH EVERY DAY, Disp: 90 tablet, Rfl: 3 .  amLODipine (NORVASC) 10 MG tablet, TAKE 1 TABLET BY MOUTH EVERY DAY, Disp: 90 tablet, Rfl: 3 .  aspirin EC 325 MG EC tablet, Take 1 tablet (325 mg total) by mouth daily., Disp: 90 tablet, Rfl: 1 .  atorvastatin (LIPITOR) 40 MG tablet, Take 1 tablet (40 mg total) by mouth daily at 6 PM., Disp: 90 tablet, Rfl: 1 .  clopidogrel (PLAVIX) 75 MG tablet, Take 1 tablet (75 mg total) by mouth daily., Disp: 90 tablet, Rfl: 1 .  colchicine 0.6 MG tablet, Take 1 tablet (0.6 mg total) by mouth daily., Disp: 60 tablet, Rfl: 5 .  ketorolac (ACULAR) 0.5 % ophthalmic solution, Place 1 drop into both eyes daily., Disp: , Rfl:  .  lisinopril (PRINIVIL,ZESTRIL) 40 MG tablet, TAKE 1 TABLET BY MOUTH EVERY DAY (Patient taking differently: Take 40 mg by mouth daily. ), Disp: 90 tablet, Rfl: 3 .  SIMBRINZA 1-0.2 % SUSP, Place 1 drop into both eyes 3 (three) times daily., Disp: , Rfl:   Review of Systems  Constitutional: Negative.   HENT: Negative.   Respiratory: Negative.   Cardiovascular: Negative.   Gastrointestinal: Negative.   Endocrine: Negative.   Genitourinary: Negative.   Musculoskeletal: Positive for arthralgias.  Allergic/Immunologic: Negative.   Hematological: Negative.   Psychiatric/Behavioral: Negative.     Social History   Tobacco Use  . Smoking status: Former Smoker    Packs/day: 1.00    Years: 50.00    Pack years: 50.00    Quit date: 12/15/2009    Years since quitting: 9.7  . Smokeless tobacco: Never Used  Substance Use Topics   . Alcohol use: No    Alcohol/week: 0.0 standard drinks      Objective:   BP 132/76 (BP Location: Left Arm, Patient Position: Sitting, Cuff Size: Large)   Pulse 94   Temp (!) 97.1 F (36.2 C) (Temporal)   Resp 18   Wt 208 lb 3.2 oz (94.4 kg)   SpO2 99%   BMI 30.75 kg/m  Vitals:   09/21/19 1102  BP: 132/76  Pulse: 94  Resp: 18  Temp: (!) 97.1 F (36.2 C)  TempSrc: Temporal  SpO2: 99%  Weight: 208 lb 3.2 oz (94.4 kg)  Body mass index is 30.75 kg/m.   Physical Exam Vitals signs reviewed.  Constitutional:      Appearance: He is well-developed.  HENT:     Head: Normocephalic and atraumatic.     Right Ear: External ear normal.     Left Ear: External ear normal.     Nose: Nose normal.  Eyes:     General: No scleral icterus.    Conjunctiva/sclera: Conjunctivae normal.  Neck:     Thyroid: No thyromegaly.  Cardiovascular:     Rate and Rhythm: Normal rate and regular rhythm.     Heart sounds: Normal heart sounds.  Pulmonary:     Effort: Pulmonary effort is normal.     Breath sounds:  Normal breath sounds.  Abdominal:     Palpations: Abdomen is soft.  Skin:    General: Skin is warm and dry.  Neurological:     General: No focal deficit present.     Mental Status: He is alert and oriented to person, place, and time.  Psychiatric:        Mood and Affect: Mood normal.        Behavior: Behavior normal.        Thought Content: Thought content normal.        Judgment: Judgment normal.      No results found for any visits on 09/21/19.     Assessment & Plan    1. Hyperglycemia Pt refuses flu shot today. - HgB A1c  2. Essential (primary) hypertension  - CBC w/Diff/Platelet - Comp. Metabolic Panel (12) - TSH  3. Cerebrovascular accident (CVA) due to embolism of right middle cerebral artery ([place]) Almost completely resolved. - CBC w/Diff/Platelet - Comp. Metabolic Panel (12) - TSH  4. COVID-19 virus infection Pt has recovered. 5.HLD Lipitor 40mg .      Cranford Mon, MD  [facility]

## 2019-09-22 LAB — CBC WITH DIFFERENTIAL/PLATELET
Basophils Absolute: 0 10*3/uL (ref 0.0–0.2)
Basos: 1 %
EOS (ABSOLUTE): 0.2 10*3/uL (ref 0.0–0.4)
Eos: 3 %
Hematocrit: 40.8 % (ref 37.5–51.0)
Hemoglobin: 13.2 g/dL (ref 13.0–17.7)
Immature Grans (Abs): 0 10*3/uL (ref 0.0–0.1)
Immature Granulocytes: 0 %
Lymphocytes Absolute: 2.2 10*3/uL (ref 0.7–3.1)
Lymphs: 36 %
MCH: 28.8 pg (ref 26.6–33.0)
MCHC: 32.4 g/dL (ref 31.5–35.7)
MCV: 89 fL (ref 79–97)
Monocytes Absolute: 0.5 10*3/uL (ref 0.1–0.9)
Monocytes: 8 %
Neutrophils Absolute: 3.2 10*3/uL (ref 1.4–7.0)
Neutrophils: 52 %
Platelets: 194 10*3/uL (ref 150–450)
RBC: 4.59 x10E6/uL (ref 4.14–5.80)
RDW: 14.1 % (ref 11.6–15.4)
WBC: 6.2 10*3/uL (ref 3.4–10.8)

## 2019-09-22 LAB — COMP. METABOLIC PANEL (12)
AST: 19 IU/L (ref 0–40)
Albumin/Globulin Ratio: 1.6 (ref 1.2–2.2)
Albumin: 4.2 g/dL (ref 3.7–4.7)
Alkaline Phosphatase: 58 IU/L (ref 39–117)
BUN/Creatinine Ratio: 21 (ref 10–24)
BUN: 16 mg/dL (ref 8–27)
Bilirubin Total: 0.4 mg/dL (ref 0.0–1.2)
Calcium: 9.7 mg/dL (ref 8.6–10.2)
Chloride: 102 mmol/L (ref 96–106)
Creatinine, Ser: 0.76 mg/dL (ref 0.76–1.27)
GFR calc Af Amer: 100 mL/min/{1.73_m2} (ref >59)
GFR calc non Af Amer: 86 mL/min/{1.73_m2} (ref >59)
Globulin, Total: 2.6 g/dL (ref 1.5–4.5)
Glucose: 90 mg/dL (ref 65–99)
Potassium: 3.9 mmol/L (ref 3.5–5.2)
Sodium: 140 mmol/L (ref 134–144)
Total Protein: 6.8 g/dL (ref 6.0–8.5)

## 2019-09-22 LAB — HEMOGLOBIN A1C
Est. average glucose Bld gHb Est-mCnc: 111 mg/dL
Hgb A1c MFr Bld: 5.5 % (ref 4.8–5.6)

## 2019-09-22 LAB — TSH: TSH: 2.82 u[IU]/mL (ref 0.450–4.500)

## 2019-09-23 ENCOUNTER — Telehealth: Payer: Self-pay

## 2019-09-23 NOTE — Telephone Encounter (Signed)
-----   Message from [name], MD sent at 09/23/2019  9:16 AM EDT ----- Labs stable.

## 2019-09-23 NOTE — Telephone Encounter (Signed)
Pt advised.   Thanks,   -Laura  

## 2019-09-27 ENCOUNTER — Ambulatory Visit (INDEPENDENT_AMBULATORY_CARE_PROVIDER_SITE_OTHER): Payer: Medicare Other

## 2019-09-27 DIAGNOSIS — I639 Cerebral infarction, unspecified: Secondary | ICD-10-CM

## 2019-09-27 DIAGNOSIS — I4891 Unspecified atrial fibrillation: Secondary | ICD-10-CM | POA: Diagnosis not present

## 2019-09-27 DIAGNOSIS — I63411 Cerebral infarction due to embolism of right middle cerebral artery: Secondary | ICD-10-CM | POA: Diagnosis not present

## 2019-10-08 ENCOUNTER — Other Ambulatory Visit: Payer: Self-pay | Admitting: Family Medicine

## 2019-10-08 DIAGNOSIS — I10 Essential (primary) hypertension: Secondary | ICD-10-CM

## 2019-10-13 ENCOUNTER — Ambulatory Visit: Payer: Self-pay | Admitting: Family Medicine

## 2019-10-14 ENCOUNTER — Ambulatory Visit: Payer: Medicare Other | Admitting: Cardiology

## 2019-11-04 ENCOUNTER — Other Ambulatory Visit: Payer: Self-pay | Admitting: Cardiology

## 2019-11-04 DIAGNOSIS — I639 Cerebral infarction, unspecified: Secondary | ICD-10-CM

## 2019-11-04 DIAGNOSIS — I4891 Unspecified atrial fibrillation: Secondary | ICD-10-CM

## 2019-11-04 DIAGNOSIS — I63411 Cerebral infarction due to embolism of right middle cerebral artery: Secondary | ICD-10-CM

## 2019-11-08 ENCOUNTER — Ambulatory Visit: Payer: Medicare Other | Admitting: Cardiology

## 2019-11-08 ENCOUNTER — Other Ambulatory Visit: Payer: Self-pay

## 2019-11-08 ENCOUNTER — Encounter: Payer: Self-pay | Admitting: Cardiology

## 2019-11-08 VITALS — BP 132/80 | HR 84 | Ht 69.0 in | Wt 211.4 lb

## 2019-11-08 DIAGNOSIS — I441 Atrioventricular block, second degree: Secondary | ICD-10-CM

## 2019-11-08 DIAGNOSIS — I10 Essential (primary) hypertension: Secondary | ICD-10-CM | POA: Diagnosis not present

## 2019-11-08 DIAGNOSIS — I440 Atrioventricular block, first degree: Secondary | ICD-10-CM

## 2019-11-08 DIAGNOSIS — I63411 Cerebral infarction due to embolism of right middle cerebral artery: Secondary | ICD-10-CM

## 2019-11-08 NOTE — Progress Notes (Signed)
Cardiology Office Note:    Date:  11/08/2019   ID:  Travis Salazar., DOB 1936/10/13, MRN [id number]  PCP:  Travis Salazar., MD  Cardiologist:  Travis Furbish, MD  Electrophysiologist:  None   Referring MD: Travis Salazar.,*     History of Present Illness:    Travis Salazar. is a 83 y.o. male here for the evaluation of possible arrhythmia in the setting of stroke, during COVID-19.  He was seen by neurology.  Cardiac event monitor was placed and completed in November 2020.  Showed the following:  Sinus rhythm with first-degree AV block.   No evidence of atrial fibrillation   Transient episode of second-degree heart block type I. No indication for pacemaker   No significant pauses.     Echo in 2020 showed  1. The left ventricle has normal systolic function, with an ejection fraction of 55-60%. The cavity size was normal. Left ventricular diastolic Doppler parameters are consistent with pseudonormal. No evidence of left ventricular regional wall motion  abnormalities.  2. The right ventricle has normal systolc function. The cavity was normal. There is no increase in right ventricular wall thickness.  3. There is mild to moderate mitral annular calcification present. No evidence of mitral valve stenosis. Mild mitral regurgitation.  4. The aortic valve is tricuspid Severe calcifcation of the aortic valve. Mild-moderate stenosis of the aortic valve. Mean gradient 16 mmHg with calculated AVA 1.17 cm^2.  5. Normal IVC size. PA systolic pressure 51 mmHg.  6. The aortic root is normal in size and structure.  Overall he has been feeling well.  No fevers chills nausea vomiting syncope bleeding.  Past Medical History:  Diagnosis Date  . Hyperlipidemia   . Hypertension     Past Surgical History:  Procedure Laterality Date  . LIPOMA EXCISION     neck  . PROSTATECTOMY     due to prostate cancer    Current Medications: Current Meds  Medication Sig  . allopurinol  (ZYLOPRIM) 300 MG tablet TAKE 1 TABLET BY MOUTH EVERY DAY (Patient taking differently: Take 300 mg by mouth as needed. )  . amLODipine (NORVASC) 10 MG tablet TAKE 1 TABLET BY MOUTH EVERY DAY  . aspirin EC 325 MG EC tablet Take 1 tablet (325 mg total) by mouth daily.  Marland Kitchen atorvastatin (LIPITOR) 40 MG tablet Take 1 tablet (40 mg total) by mouth daily at 6 PM.  . clopidogrel (PLAVIX) 75 MG tablet Take 1 tablet (75 mg total) by mouth daily.  . colchicine 0.6 MG tablet Take 1 tablet (0.6 mg total) by mouth daily.  Marland Kitchen ketorolac (ACULAR) 0.5 % ophthalmic solution Place 1 drop into both eyes daily.  Marland Kitchen lisinopril (PRINIVIL,ZESTRIL) 40 MG tablet TAKE 1 TABLET BY MOUTH EVERY DAY  . SIMBRINZA 1-0.2 % SUSP Place 1 drop into both eyes 3 (three) times daily.     Allergies:   Patient has no known allergies.   Social History   Socioeconomic History  . Marital status: Married    Spouse name: Travis Salazar  . Number of children: 4  . Years of education: 10th grade  . Highest education level: 9th grade  Occupational History  . Occupation: retired  Scientific laboratory technician  . Financial resource strain: Not hard at all  . Food insecurity    Worry: Never true    Inability: Never true  . Transportation needs    Medical: No    Non-medical: No  Tobacco Use  . Smoking status:  Former Smoker    Packs/day: 1.00    Years: 50.00    Pack years: 50.00    Quit date: 12/15/2009    Years since quitting: 9.9  . Smokeless tobacco: Never Used  Substance and Sexual Activity  . Alcohol use: No    Alcohol/week: 0.0 standard drinks  . Drug use: No  . Sexual activity: Not on file  Lifestyle  . Physical activity    Days per week: 0 days    Minutes per session: 0 min  . Stress: Not at all  Relationships  . Social Herbalist on phone: Patient refused    Gets together: Patient refused    Attends religious service: Patient refused    Active member of club or organization: Patient refused    Attends meetings of clubs or  organizations: Patient refused    Relationship status: Patient refused  Other Topics Concern  . Not on file  Social History Narrative  . Not on file     Family History: The patient's family history includes COPD in his mother; Heart attack in his father.  ROS:   Please see the history of present illness.     All other systems reviewed and are negative.  EKGs/Labs/Other Studies Reviewed:    The following studies were reviewed today: Echocardiogram and event monitor as above  EKG: Prior event monitor EKG reviewed  Recent Labs: 06/27/2019: B Natriuretic Peptide 64.5 06/28/2019: ALT 19 09/21/2019: BUN 16; Creatinine, Ser 0.76; Hemoglobin 13.2; Platelets 194; Potassium 3.9; Sodium 140; TSH 2.820  Recent Lipid Panel    Component Value Date/Time   CHOL 141 08/04/2019 0849   TRIG 95 08/04/2019 0849   HDL 35 (L) 08/04/2019 0849   CHOLHDL 4.0 08/04/2019 0849   CHOLHDL 7.6 06/27/2019 0548   VLDL 25 06/27/2019 0548   LDLCALC 87 08/04/2019 0849   LDLCALC 119 (H) 10/06/2017 1208    Physical Exam:    VS:  BP 132/80   Pulse 84   Ht 5\' 9"  (1.753 m)   Wt 211 lb 6.4 oz (95.9 kg)   SpO2 99%   BMI 31.22 kg/m     Wt Readings from Last 3 Encounters:  11/08/19 211 lb 6.4 oz (95.9 kg)  09/21/19 208 lb 3.2 oz (94.4 kg)  08/04/19 203 lb 3.2 oz (92.2 kg)     GEN:  Well nourished, well developed in no acute distress HEENT: Normal NECK: No JVD; No carotid bruits LYMPHATICS: No lymphadenopathy CARDIAC: RRR, 2/6 systolic right upper sternal border murmur, no rubs, gallops RESPIRATORY:  Clear to auscultation without rales, wheezing or rhonchi  ABDOMEN: Soft, non-tender, non-distended MUSCULOSKELETAL:  No edema; No deformity  SKIN: Warm and dry NEUROLOGIC:  Alert and oriented x 3 PSYCHIATRIC:  Normal affect   ASSESSMENT:    1. Cerebrovascular accident (CVA) due to embolism of right middle cerebral artery (Travis Salazar)   2. Essential hypertension   3. First degree AV block   4. Second  degree AV block, Mobitz type I    PLAN:    In order of problems listed above:  Mild to moderate aortic stenosis -We will continue to monitor clinically.  We will see him back in 2 years.  Likely recheck echocardiogram at that time.  He will let us know if any symptoms occur before then.  Stroke -No evidence of arrhythmia on event monitor.  Reassurance.  He is continuing with Plavix, aspirin, neurology note  First-degree AV block/second-degree AV block type I -Seen on  event monitor.  Try to avoid beta-blockers.  He has not had any high risk symptoms such as syncope.  No need for pacemaker at this time.  We will keep a watch on this.  Essential hypertension -Continue with current medications as above.  2-year follow-up  He was here with his son.   Medication Adjustments/Labs and Tests Ordered: Current medicines are reviewed at length with the patient today.  Concerns regarding medicines are outlined above.  No orders of the defined types were placed in this encounter.  No orders of the defined types were placed in this encounter.   Patient Instructions  Medication Instructions:  Your physician recommends that you continue on your current medications as directed. Please refer to the Current Medication list given to you today.  *If you need a refill on your cardiac medications before your next appointment, please call your pharmacy*  Follow-Up: At [facility], you and your health needs are our priority.  As part of our continuing mission to provide you with exceptional heart care, we have created designated Provider Care Teams.  These Care Teams include your primary Cardiologist (physician) and Advanced Practice Providers (APPs -  Physician Assistants and Nurse Practitioners) who all work together to provide you with the care you need, when you need it.  Your next appointment:   2 year(s)  The format for your next appointment:   In Person  Provider:   You may see Travis Furbish, MD or one of the following Advanced Practice Providers on your designated Care Team:    Truitt Merle, NP  Cecilie Kicks, NP  Kathyrn Drown, NP       Signed, Travis Furbish, MD  11/08/2019 3:53 PM    [place]

## 2019-11-08 NOTE — Patient Instructions (Signed)
Medication Instructions:  Your physician recommends that you continue on your current medications as directed. Please refer to the Current Medication list given to you today.  *If you need a refill on your cardiac medications before your next appointment, please call your pharmacy*  Follow-Up: At [facility], you and your health needs are our priority.  As part of our continuing mission to provide you with exceptional heart care, we have created designated Provider Care Teams.  These Care Teams include your primary Cardiologist (physician) and Advanced Practice Providers (APPs -  Physician Assistants and Nurse Practitioners) who all work together to provide you with the care you need, when you need it.  Your next appointment:   2 year(s)  The format for your next appointment:   In Person  Provider:   You may see Candee Furbish, MD or one of the following Advanced Practice Providers on your designated Care Team:    Truitt Merle, NP  Cecilie Kicks, NP  Kathyrn Drown, NP

## 2019-11-17 ENCOUNTER — Telehealth: Payer: Self-pay

## 2019-11-17 NOTE — Telephone Encounter (Signed)
Copied from [place] [phone]. Topic: General - Inquiry >> Nov 17, 2019 12:16 PM Greggory Keen D wrote: Reason for CRM: pts wife called saying [facility] in [place] told patient they have sent in prescription request 11/27.  Pharmacy told pt that they sent request 2 times.  Pharmacy said they have not heard anything back from Dr. Darnell Level.  Pt wants to know if Dr. Darnell Level received the request for refills.  He said it for chol, He does not know what the other two are.  CB#  [phone]

## 2019-11-22 ENCOUNTER — Other Ambulatory Visit: Payer: Self-pay | Admitting: Family Medicine

## 2019-11-22 MED ORDER — LISINOPRIL 40 MG PO TABS: 40.0000 mg | ORAL_TABLET | Freq: Every day | ORAL | 3 refills | Status: DC

## 2019-11-22 MED ORDER — ATORVASTATIN CALCIUM 40 MG PO TABS: 40.0000 mg | ORAL_TABLET | Freq: Every day | ORAL | 1 refills | Status: DC

## 2019-11-22 NOTE — Telephone Encounter (Signed)
Patient has been waiting since 11/12/19 for refills on Atorvastatin and Plavix.  He is totally out and needs these.   He said [facility] [place] sent in [facility] refill request on 11/12/19.

## 2019-11-22 NOTE — Telephone Encounter (Signed)
Rxs were sent to pharmacy

## 2019-11-29 NOTE — Telephone Encounter (Signed)
I have responded to any prescriptions that I have seen.

## 2019-12-01 ENCOUNTER — Ambulatory Visit: Payer: Medicare Other | Admitting: Adult Health

## 2019-12-07 ENCOUNTER — Encounter: Payer: Self-pay | Admitting: Adult Health

## 2019-12-07 ENCOUNTER — Other Ambulatory Visit: Payer: Self-pay

## 2019-12-07 ENCOUNTER — Ambulatory Visit: Payer: Medicare Other | Admitting: Adult Health

## 2019-12-07 VITALS — BP 143/79 | HR 86 | Temp 97.8°F | Ht 69.0 in | Wt 210.2 lb

## 2019-12-07 DIAGNOSIS — I6521 Occlusion and stenosis of right carotid artery: Secondary | ICD-10-CM | POA: Diagnosis not present

## 2019-12-07 DIAGNOSIS — E785 Hyperlipidemia, unspecified: Secondary | ICD-10-CM

## 2019-12-07 DIAGNOSIS — R911 Solitary pulmonary nodule: Secondary | ICD-10-CM | POA: Diagnosis not present

## 2019-12-07 DIAGNOSIS — I10 Essential (primary) hypertension: Secondary | ICD-10-CM | POA: Diagnosis not present

## 2019-12-07 DIAGNOSIS — I63511 Cerebral infarction due to unspecified occlusion or stenosis of right middle cerebral artery: Secondary | ICD-10-CM

## 2019-12-07 NOTE — Patient Instructions (Signed)
Continue clopidogrel 75 mg daily  and Lipitor for secondary stroke prevention  Recommend repeating imaging of your head and neck vessels to ensure narrowing has not worsened and possibly improved  Discontinue aspirin as you have completed recommended 3 months of aspirin and Plavix together  Continue to follow up with PCP regarding cholesterol and blood pressure management   Continue to monitor blood pressure at home  Maintain strict control of hypertension with blood pressure goal below 130/90, diabetes with hemoglobin A1c goal below 6.5% and cholesterol with LDL cholesterol (bad cholesterol) goal below 70 mg/dL. I also advised the patient to eat a healthy diet with plenty of whole grains, cereals, fruits and vegetables, exercise regularly and maintain ideal body weight.  Followup in the future with me in 6 months or call earlier if needed       Thank you for coming to see [place] at [facility]. I hope we have been able to provide you high quality care today.  You may receive a patient satisfaction survey over the next few weeks. We would appreciate your feedback and comments so that we may continue to improve ourselves and the health of our patients.

## 2019-12-07 NOTE — Progress Notes (Signed)
[facility] [address] [place]. [place] [address] [phone]       OFFICE FOLLOW UP NOTE  [name]. Date of Birth:  May 12, 1936 Medical Record Number:  [id number]   Reason for Referral: Right MCA stroke follow up    CHIEF COMPLAINT:  Chief Complaint  Patient presents with  . Follow-up    4 mon f/u. Son present. Treatment room. No new concerns at this time.     HPI:   Stroke admission 06/26/2019: [name]. is a 83 y.o. male with history of HTN, and HLD who presents to [facility] ED with left side weakness, neglect and inability to swallow. CT head revealed large Right MCA infarct.   Stroke work-up revealed large right MCA infarct as evidenced on MRI embolic pattern secondary to large vessel arthrosclerosis versus occult A. fib with residual left hemianopia, left pronator drift and left neglect.  He did not receive IV t-PA due to late presentation (>4.5 hours from time of onset). He was found to be  [employer] Virus 2 positive asymptomatic.  CTA showed asymmetric arthrosclerotic changes in the right MCA branch vessels including proximal M2 segments corresponding to the infarct territory and bilateral carotid bulb and siphon arthrosclerosis but no significant stenosis.  2D echo unremarkable.  Recommended 30-day cardiac event monitor to rule out A. fib outpatient.  Recommended DAPT for 3 months then Plavix alone due to intracranial stenosis.  HTN stable.  LDL 121 and recommended increasing home dose of atorvastatin to 40 mg daily.  A1c 6.3.  Other stroke risk factors include advanced age, former tobacco use and obesity but no prior history of stroke.  Other active problems during admission include mild thrombocytopenia and 6 mm pleural-based right upper lobe pulmonary nodule and recommended follow-up CT in 3 to 6 months outpatient.  Discharged home in stable condition with recommendation of home health PT/OT.  Initial visit 08/04/2019: He has been recovering well  from a stroke standpoint with residual mild left hemiparesis but has returned back to all prior activities and believes he is about 90% back to his baseline Was initially receiving home health therapies but has since been completed.  He continues to do HEP and stays active throughout the day He continues to live with his wife doing ADLs and IADLs independently Continues on aspirin 3 2 5  mg and clopidogrel without bleeding or bruising Continues on atorvastatin 40 mg daily without myalgias -has not had repeat levels since discharge Blood pressure satisfactory at 137/83 He has not underwent 30-day cardiac event monitoring at this time No further concerns at this time  Update 12/07/2019: [name] is a 83 year old male who is being seen today for stroke follow-up accompanied by his son.  He reports complete recovery regarding prior weakness and continues to work with his wife maintaining ADLs and IADLs independently.  He did complete 30-day cardiac event monitor which was negative for atrial fibrillation but did show first-degree AV block and transient episodes of second-degree heart block type I.  Evaluated by cardiology without indication for pacemaker at that point.  Recommended 42-month DAPT but has continued at this time but denies bleeding or bruising.  Continues on atorvastatin without myalgias.  Lipid panel obtained at prior visit which showed LDL 87.  Blood pressure today 143/79.  Denies new or worsening stroke/TIA symptoms.    ROS:   14 system review of systems performed and negative with exception of no complaints  PMH:  Past Medical History:  Diagnosis  Date  . Hyperlipidemia   . Hypertension     PSH:  Past Surgical History:  Procedure Laterality Date  . LIPOMA EXCISION     neck  . PROSTATECTOMY     due to prostate cancer    Social History:  Social History   Socioeconomic History  . Marital status: Married    Spouse name: [name]  . Number of children: 4  . Years of  education: 10th grade  . Highest education level: 9th grade  Occupational History  . Occupation: retired  Tobacco Use  . Smoking status: Former Smoker    Packs/day: 1.00    Years: 50.00    Pack years: 50.00    Quit date: 12/15/2009    Years since quitting: 9.9  . Smokeless tobacco: Never Used  Substance and Sexual Activity  . Alcohol use: No    Alcohol/week: 0.0 standard drinks  . Drug use: No  . Sexual activity: Not on file  Other Topics Concern  . Not on file  Social History Narrative  . Not on file   Social Determinants of Health   Financial Resource Strain:   . Difficulty of Paying Living Expenses: Not on file  Food Insecurity:   . Worried About Charity fundraiser in the Last Year: Not on file  . Ran Out of Food in the Last Year: Not on file  Transportation Needs:   . Lack of Transportation (Medical): Not on file  . Lack of Transportation (Non-Medical): Not on file  Physical Activity: Inactive  . Days of Exercise per Week: 0 days  . Minutes of Exercise per Session: 0 min  Stress:   . Feeling of Stress : Not on file  Social Connections: Unknown  . Frequency of Communication with Friends and Family: Patient refused  . Frequency of Social Gatherings with Friends and Family: Patient refused  . Attends Religious Services: Patient refused  . Active Member of Clubs or Organizations: Patient refused  . Attends Archivist Meetings: Patient refused  . Marital Status: Patient refused  Intimate Partner Violence: Unknown  . Fear of Current or Ex-Partner: Patient refused  . Emotionally Abused: Patient refused  . Physically Abused: Patient refused  . Sexually Abused: Patient refused    Family History:  Family History  Problem Relation Age of Onset  . COPD Mother   . Heart attack Father     Medications:   Current Outpatient Medications on File Prior to Visit  Medication Sig Dispense Refill  . allopurinol (ZYLOPRIM) 300 MG tablet TAKE 1 TABLET BY MOUTH EVERY  DAY (Patient taking differently: Take 300 mg by mouth as needed. ) 90 tablet 3  . amLODipine (NORVASC) 10 MG tablet TAKE 1 TABLET BY MOUTH EVERY DAY 90 tablet 3  . aspirin EC 325 MG EC tablet Take 1 tablet (325 mg total) by mouth daily. 90 tablet 1  . atorvastatin (LIPITOR) 40 MG tablet Take 1 tablet (40 mg total) by mouth daily at 6 PM. 90 tablet 1  . clopidogrel (PLAVIX) 75 MG tablet Take 1 tablet (75 mg total) by mouth daily. 90 tablet 1  . colchicine 0.6 MG tablet Take 1 tablet (0.6 mg total) by mouth daily. 60 tablet 5  . ketorolac (ACULAR) 0.5 % ophthalmic solution Place 1 drop into both eyes daily.    Marland Kitchen lisinopril (ZESTRIL) 40 MG tablet Take 1 tablet (40 mg total) by mouth daily. 90 tablet 3  . SIMBRINZA 1-0.2 % SUSP Place 1 drop into both  eyes 3 (three) times daily.     No current facility-administered medications on file prior to visit.    Allergies:  No Known Allergies   Physical Exam  Vitals:   12/07/19 0946  BP: (!) 143/79  Pulse: 86  Temp: 97.8 F (36.6 C)  TempSrc: Oral  Weight: 210 lb 3.2 oz (95.3 kg)  Height: 5\' 9"  (1.753 m)   Body mass index is 31.04 kg/m. No exam data present  General: well developed, well nourished,  pleasant elderly African-American male, seated, in no evident distress Head: head normocephalic and atraumatic.   Neck: supple with no carotid or supraclavicular bruits Cardiovascular: regular rate and rhythm, no murmurs Musculoskeletal: no deformity Skin:  no rash/petichiae Vascular:  Normal pulses all extremities   Neurologic Exam Mental Status: Awake and fully alert.   Normal speech and language.  Oriented to place and time. Recent and remote memory intact. Attention span, concentration and fund of knowledge appropriate. Mood and affect appropriate.  Cranial Nerves: Pupils equal, briskly reactive to light. Extraocular movements full without nystagmus. Visual fields full to confrontation. Hearing intact. Facial sensation intact. Face,  tongue, palate moves normally and symmetrically.  Motor: Normal bulk and tone.  Full strength in all tested extremities except mildly decreased left hand dexterity Sensory.: intact to touch , pinprick , position and vibratory sensation.  Coordination: Rapid alternating movements normal in all extremities except mildly diminished left hand. Finger-to-nose and heel-to-shin performed accurately bilaterally.  Mildly orbits right arm over left arm Gait and Station: Arises from chair without difficulty. Stance is normal. Gait demonstrates normal stride length and balance Reflexes: 1+ and symmetric. Toes downgoing.        Diagnostic Data (Labs, Imaging, Testing)  Ct Angio Head W Or Wo Contrast Ct Angio Neck W Or Wo Contrast Ct Cerebral Perfusion W Contrast 06/26/2019 IMPRESSION:  1. CT perfusion results are discordant from the noncontrast head CT. This likely represents pseudo normalization. Noncontrast head CT demonstrates a large right MCA territory infarct with an aspects score of 5/10.  2. No emergent large vessel occlusion.  3. Asymmetric atherosclerotic changes in the right MCA branch vessels including proximal M2 segments corresponding to the infarct territory.  4. Atherosclerotic changes involving the aortic arch, carotid bifurcations, and cavernous internal carotid arteries bilaterally without significant stenosis.  5. Multilevel degenerative changes the cervical spine.  6. 6 mm pleural-based right upper lobe pulmonary nodule. Additional airspace opacification is present laterally in the upper lobes bilaterally. This may be related to fibrotic change. Follow-up non-contrast CT recommended at 3-6 months to confirm persistence. If unchanged, and solid component remains <6 mm, annual CT is recommended until 5 years of stability has been established. If persistent these nodules should be considered highly suspicious if the solid component of the nodule is 6 mm or greater in size and enlarging.  This recommendation follows the consensus statement: Guidelines for Management of Incidental Pulmonary Nodules Detected on CT Images: From the Fleischner Society 2017; Radiology 2017; 284:228-243.   Ct Head Code Stroke Wo Contrast 06/26/2019 IMPRESSION:  1. Large right MCA territory infarct without hemorrhage.  2. Early mass effect with partial effacement the sulci but no significant midline shift.  3. Atherosclerosis  4. ASPECTS is 5/10   ECHOCARDIOGRAM 06/28/2019 IMPRESSIONS  1. The left ventricle has normal systolic function, with an ejection fraction of 55-60%. The cavity size was normal. Left ventricular diastolic Doppler parameters are consistent with pseudonormal. No evidence of left ventricular regional wall motion  abnormalities.  2.  The right ventricle has normal systolc function. The cavity was normal. There is no increase in right ventricular wall thickness.  3. There is mild to moderate mitral annular calcification present. No evidence of mitral valve stenosis. Mild mitral regurgitation.  4. The aortic valve is tricuspid Severe calcifcation of the aortic valve. Mild-moderate stenosis of the aortic valve. Mean gradient 16 mmHg with calculated AVA 1.17 cm^2.  5. Normal IVC size. PA systolic pressure 51 mmHg.  6. The aortic root is normal in size and structure.    ASSESSMENT: Ollivander Laduke. is a 83 y.o. year old male presented with left-sided weakness, neglect and difficulty swallowing on 06/26/2019 with stroke work-up revealing large right MCA infarct embolic pattern secondary to large vessel arthrosclerosis versus occult A. fib.  He was also found to be positive for COVID-19 asymptomatic.  Vascular risk factors include HTN, HLD and prior tobacco use.  Recovered well from a stroke standpoint with only mild residual decreased left hand dexterity    PLAN:  1. Right MCA infarct: Continue clopidogrel 75 mg daily  and atorvastatin for secondary stroke prevention.  Advised to  discontinue aspirin and continue Plavix alone is 36-month DAPT completed.  No indication for prolonged DAPT from a neurological standpoint as this only increases risk of hemorrhage and does not provide additional benefit for stroke prevention. Maintain strict control of hypertension with blood pressure goal below 130/90, diabetes with hemoglobin A1c goal below 6.5% and cholesterol with LDL cholesterol (bad cholesterol) goal below 70 mg/dL.  I also advised the patient to eat a healthy diet with plenty of whole grains, cereals, fruits and vegetables, exercise regularly with at least 30 minutes of continuous activity daily and maintain ideal body weight.  Advised to continue to do HEP as recommended during therapy for ongoing improvement 2. HTN: Advised to continue current treatment regimen.  Today's BP satisfactory at 143/79.  Advised to continue to monitor at home along with continued follow-up with PCP for management 3. HLD: Advised to continue current treatment regimen along with continued follow-up with PCP for future prescribing and monitoring of lipid panel.  Lipid panel obtained today to ensure adequate management with current dosage of statin 4. Carotid stenosis: Repeat CTA head/neck to follow-up on prior imaging and ensure no worsening stenosis.  Also recommended repeat imaging in 3 to 6 months due to incidental finding of 6 mm pleural-based right upper lobe pulmonary nodule.  Discussion with patient regarding if any changes noted, he needs to follow-up with his PCP to pursue further imaging with CT lung and as recommended annual monitoring     Follow up in 6 months or call earlier if needed   Greater than 50% of time during this 25 minute visit was spent on counseling, explanation of diagnosis of right MCA infarct, reviewing risk factor management of carotid stenosis, HTN and HLD, planning of further management along with potential future management, discussion regarding indication for repeat  imaging and incidental finding of pulmonary nodule and discussion with patient and family answering all questions.    Frann Rider, AGNP-BC  [facility] [address] [place], [place] [address]  Phone [phone] Fax [phone] Note: This document was prepared with digital dictation and possible smart phrase technology. Any transcriptional errors that result from this process are unintentional.

## 2019-12-07 NOTE — Progress Notes (Signed)
I agree with the above plan 

## 2019-12-16 ENCOUNTER — Ambulatory Visit
Admission: RE | Admit: 2019-12-16 | Discharge: 2019-12-16 | Disposition: A | Discharge status: Home / Self Care | Payer: Medicare Other | Source: Ambulatory Visit | Attending: Adult Health | Admitting: Adult Health

## 2019-12-16 DIAGNOSIS — I63233 Cerebral infarction due to unspecified occlusion or stenosis of bilateral carotid arteries: Secondary | ICD-10-CM | POA: Diagnosis not present

## 2019-12-16 MED ORDER — IOPAMIDOL (ISOVUE-370) INJECTION 76%
75.0000 mL | Freq: Once | INTRAVENOUS | Status: AC | PRN
  Administered 2019-12-16: 75 mL via INTRAVENOUS

## 2019-12-20 ENCOUNTER — Other Ambulatory Visit: Payer: Self-pay | Admitting: Adult Health

## 2019-12-20 ENCOUNTER — Telehealth: Payer: Self-pay | Admitting: *Deleted

## 2019-12-20 DIAGNOSIS — R911 Solitary pulmonary nodule: Secondary | ICD-10-CM

## 2019-12-20 NOTE — Telephone Encounter (Signed)
I relayed to pt that the recent imaging CTA head showed minimal amount of narrowing with no worsening of mild arthrosclerotic disease.  Ordered a CT chest as pulmonary nodule would not show on this scan and f/u on this per prior imaging study.  Pt verbalized understanding.

## 2019-12-20 NOTE — Telephone Encounter (Signed)
-----   Message from Frann Rider, NP sent at 12/20/2019  9:34 AM EST ----- Please advise patient that his recent imaging showed minimal amount of stenosis with only mild arthrosclerotic disease seen previously without worsening.  No mention of pulmonary nodule is identified on prior imaging therefore we will place order for CT chest.  Please advise patient.

## 2020-01-05 ENCOUNTER — Other Ambulatory Visit: Payer: Medicare Other

## 2020-01-05 ENCOUNTER — Other Ambulatory Visit: Payer: Self-pay

## 2020-01-05 ENCOUNTER — Telehealth: Payer: Self-pay | Admitting: Adult Health

## 2020-01-05 ENCOUNTER — Ambulatory Visit
Admission: RE | Admit: 2020-01-05 | Discharge: 2020-01-05 | Disposition: A | Discharge status: Home / Self Care | Payer: Medicare Other | Source: Ambulatory Visit | Attending: Adult Health | Admitting: Adult Health

## 2020-01-05 DIAGNOSIS — R911 Solitary pulmonary nodule: Secondary | ICD-10-CM | POA: Diagnosis not present

## 2020-01-05 NOTE — Telephone Encounter (Signed)
I did call patient to give him results of CT chest with referral to oncology.  I also just spoke to his wife and daughter to answer any additional questions.  Thank you for letting me know.

## 2020-01-05 NOTE — Telephone Encounter (Signed)
I called and spoke to [facility] at GI.  I see report in epic, will forward to JM/NP.

## 2020-01-05 NOTE — Telephone Encounter (Signed)
Called Mr. Nova to advise him on recent CT chest results.  CT chest obtained due to incidental finding of pulmonary nodule during stroke work-up in 06/2019 and recommended 78-month follow-up imaging.  CT chest showed increased size of pleural-based right upper lobe nodule now measuring 11x 9 mm, previously 6x 5 mm concerning for primary lung cancer.  He was advised that a referral will be placed to oncology for further evaluation.  Answered questions to patient satisfaction and appreciative of phone call.

## 2020-01-05 NOTE — Telephone Encounter (Signed)
Diane from [facility] called needing to speak to RN about the pt's CT Chest call report. Please advise.

## 2020-01-06 ENCOUNTER — Other Ambulatory Visit: Payer: Self-pay | Admitting: Family Medicine

## 2020-01-06 ENCOUNTER — Telehealth: Payer: Self-pay | Admitting: Internal Medicine

## 2020-01-06 MED ORDER — CLOPIDOGREL BISULFATE 75 MG PO TABS: 75.0000 mg | ORAL_TABLET | Freq: Every day | ORAL | 1 refills | Status: DC

## 2020-01-06 NOTE — Telephone Encounter (Signed)
Received a new pt referral from [facility] for rt pulmonary nodule. I cld and spoke to Mr. Noor daughter to schedule an appt for him to see Dr. Julien Nordmann on 1/22 at 930am w/labs at [place]. Pt's daughter states that her brother will be bringing him to the appt. I informed her that her brother can attend the appt. Aware to arrive 15 minutes early.

## 2020-01-06 NOTE — Telephone Encounter (Signed)
[facility] faxed refill request for the following medications: ° °clopidogrel (PLAVIX) 75 MG tablet ° ° °Please advise. °

## 2020-01-07 ENCOUNTER — Inpatient Hospital Stay: Payer: Medicare Other | Attending: Internal Medicine | Admitting: Internal Medicine

## 2020-01-07 ENCOUNTER — Encounter: Payer: Self-pay | Admitting: Internal Medicine

## 2020-01-07 ENCOUNTER — Inpatient Hospital Stay: Payer: Medicare Other

## 2020-01-07 ENCOUNTER — Other Ambulatory Visit: Payer: Self-pay

## 2020-01-07 ENCOUNTER — Other Ambulatory Visit: Payer: Self-pay | Admitting: Medical Oncology

## 2020-01-07 VITALS — BP 132/85 | HR 98 | Temp 98.3°F | Resp 18 | Ht 69.0 in | Wt 204.7 lb

## 2020-01-07 DIAGNOSIS — R911 Solitary pulmonary nodule: Secondary | ICD-10-CM

## 2020-01-07 DIAGNOSIS — Z9079 Acquired absence of other genital organ(s): Secondary | ICD-10-CM | POA: Diagnosis not present

## 2020-01-07 DIAGNOSIS — Z87891 Personal history of nicotine dependence: Secondary | ICD-10-CM | POA: Diagnosis not present

## 2020-01-07 DIAGNOSIS — Z8673 Personal history of transient ischemic attack (TIA), and cerebral infarction without residual deficits: Secondary | ICD-10-CM | POA: Diagnosis not present

## 2020-01-07 DIAGNOSIS — I10 Essential (primary) hypertension: Secondary | ICD-10-CM | POA: Diagnosis not present

## 2020-01-07 DIAGNOSIS — Z8546 Personal history of malignant neoplasm of prostate: Secondary | ICD-10-CM | POA: Diagnosis not present

## 2020-01-07 DIAGNOSIS — R918 Other nonspecific abnormal finding of lung field: Secondary | ICD-10-CM | POA: Insufficient documentation

## 2020-01-07 LAB — CBC WITH DIFFERENTIAL (CANCER CENTER ONLY)
Abs Immature Granulocytes: 0.01 10*3/uL (ref 0.00–0.07)
Basophils Absolute: 0 10*3/uL (ref 0.0–0.1)
Basophils Relative: 1 %
Eosinophils Absolute: 0.3 10*3/uL (ref 0.0–0.5)
Eosinophils Relative: 4 %
HCT: 43.1 % (ref 39.0–52.0)
Hemoglobin: 13.9 g/dL (ref 13.0–17.0)
Immature Granulocytes: 0 %
Lymphocytes Relative: 42 %
Lymphs Abs: 3.1 10*3/uL (ref 0.7–4.0)
MCH: 29.1 pg (ref 26.0–34.0)
MCHC: 32.3 g/dL (ref 30.0–36.0)
MCV: 90.4 fL (ref 80.0–100.0)
Monocytes Absolute: 0.7 10*3/uL (ref 0.1–1.0)
Monocytes Relative: 10 %
Neutro Abs: 3.1 10*3/uL (ref 1.7–7.7)
Neutrophils Relative %: 43 %
Platelet Count: 158 10*3/uL (ref 150–400)
RBC: 4.77 MIL/uL (ref 4.22–5.81)
RDW: 14.2 % (ref 11.5–15.5)
WBC Count: 7.2 10*3/uL (ref 4.0–10.5)
nRBC: 0 % (ref 0.0–0.2)

## 2020-01-07 LAB — CMP (CANCER CENTER ONLY)
ALT: 12 U/L (ref 0–44)
AST: 19 U/L (ref 15–41)
Albumin: 4 g/dL (ref 3.5–5.0)
Alkaline Phosphatase: 57 U/L (ref 38–126)
Anion gap: 10 (ref 5–15)
BUN: 13 mg/dL (ref 8–23)
CO2: 26 mmol/L (ref 22–32)
Calcium: 9.4 mg/dL (ref 8.9–10.3)
Chloride: 106 mmol/L (ref 98–111)
Creatinine: 0.81 mg/dL (ref 0.61–1.24)
GFR, Est AFR Am: >60 mL/min (ref >60)
GFR, Estimated: >60 mL/min (ref >60)
Glucose, Bld: 96 mg/dL (ref 70–99)
Potassium: 3.9 mmol/L (ref 3.5–5.1)
Sodium: 142 mmol/L (ref 135–145)
Total Bilirubin: 0.5 mg/dL (ref 0.3–1.2)
Total Protein: 7.1 g/dL (ref 6.5–8.1)

## 2020-01-07 NOTE — Progress Notes (Signed)
[place] [phone]   [phone]  CONSULT NOTE  REFERRING PHYSICIAN: Frann Rider, NP  REASON FOR CONSULTATION:  84 years old African-American male with suspicious pulmonary nodule.  HPI Travis Salazar. is a 84 y.o. male with past medical history significant for hypertension, dyslipidemia, history of prostate cancer status post prostatectomy in 2000 as well as a recent history of a stroke and long history of smoking but quit in 2010.  On December 16, 2019 the patient underwent CT angio of the neck and head for evaluation of his a stroke.  There was incidental finding of a nodule in the right lung.  The patient had CT scan of the chest without contrast on January 05, 2020 and it showed interval increase in the size of pleural-based the right upper lobe nodule measuring 1.1 x 0.9 cm compared to 0.6 x 0.5 cm on the previous imaging studies.  The patient was referred to the clinic today for further evaluation and recommendation regarding management of his condition. When seen today he is feeling fine with no concerning complaints.  He denied having any current chest pain, shortness of breath, cough or hemoptysis.  He denied having any nausea, vomiting, diarrhea or constipation.  He has no headache or visual changes.  He has no recent weight loss or night sweats. Family history significant for father with heart disease and mother with COPD. The patient is married and has four children.  He used to do autobody work.  He has a history of smoking 1 pack/day for around 60 years and quit in 2010.  No history of alcohol or drug abuse.  HPI  Past Medical History:  Diagnosis Date  . Hyperlipidemia   . Hypertension     Past Surgical History:  Procedure Laterality Date  . LIPOMA EXCISION     neck  . PROSTATECTOMY     due to prostate cancer    Family History  Problem Relation Age of Onset  . COPD Mother   . Heart attack Father     Social  History Social History   Tobacco Use  . Smoking status: Former Smoker    Packs/day: 1.00    Years: 50.00    Pack years: 50.00    Quit date: 12/15/2009    Years since quitting: 10.0  . Smokeless tobacco: Never Used  Substance Use Topics  . Alcohol use: No    Alcohol/week: 0.0 standard drinks  . Drug use: No    No Known Allergies  Current Outpatient Medications  Medication Sig Dispense Refill  . allopurinol (ZYLOPRIM) 300 MG tablet TAKE 1 TABLET BY MOUTH EVERY DAY (Patient taking differently: Take 300 mg by mouth as needed. ) 90 tablet 3  . amLODipine (NORVASC) 10 MG tablet TAKE 1 TABLET BY MOUTH EVERY DAY 90 tablet 3  . aspirin EC 325 MG EC tablet Take 1 tablet (325 mg total) by mouth daily. 90 tablet 1  . atorvastatin (LIPITOR) 40 MG tablet Take 1 tablet (40 mg total) by mouth daily at 6 PM. 90 tablet 1  . clopidogrel (PLAVIX) 75 MG tablet Take 1 tablet (75 mg total) by mouth daily. 90 tablet 1  . colchicine 0.6 MG tablet Take 1 tablet (0.6 mg total) by mouth daily. 60 tablet 5  . ketorolac (ACULAR) 0.5 % ophthalmic solution Place 1 drop into both eyes daily.    Marland Kitchen lisinopril (ZESTRIL) 40 MG tablet Take 1 tablet (40 mg total) by mouth daily. 90 tablet  3  . SIMBRINZA 1-0.2 % SUSP Place 1 drop into both eyes 3 (three) times daily.     No current facility-administered medications for this visit.    Review of Systems  Constitutional: negative Eyes: negative Ears, nose, mouth, throat, and face: negative Respiratory: negative Cardiovascular: negative Gastrointestinal: negative Genitourinary:negative Integument/breast: negative Hematologic/lymphatic: negative Musculoskeletal:negative Neurological: negative Behavioral/Psych: negative Endocrine: negative Allergic/Immunologic: negative  Physical Exam  [id number], healthy, no distress, well nourished and well developed SKIN: skin color, texture, turgor are normal, no rashes or significant lesions HEAD: Normocephalic, No  masses, lesions, tenderness or abnormalities EYES: normal, PERRLA, Conjunctiva are pink and non-injected EARS: External ears normal, Canals clear OROPHARYNX:no exudate, no erythema and lips, buccal mucosa, and tongue normal  NECK: supple, no adenopathy, no JVD LYMPH:  no palpable lymphadenopathy, no hepatosplenomegaly LUNGS: clear to auscultation , and palpation HEART: regular rate & rhythm, no murmurs and no gallops ABDOMEN:abdomen soft, non-tender, normal bowel sounds and no masses or organomegaly BACK: No CVA tenderness, Range of motion is normal EXTREMITIES:no joint deformities, effusion, or inflammation, no edema  NEURO: alert & oriented x 3 with fluent speech, no focal motor/sensory deficits  PERFORMANCE STATUS: ECOG 1  LABORATORY DATA: Lab Results  Component Value Date   WBC 7.2 01/07/2020   HGB 13.9 01/07/2020   HCT 43.1 01/07/2020   MCV 90.4 01/07/2020   PLT 158 01/07/2020      Chemistry      Component Value Date/Time   NA 142 01/07/2020 0919   NA 140 09/21/2019 1144   K 3.9 01/07/2020 0919   CL 106 01/07/2020 0919   CO2 26 01/07/2020 0919   BUN 13 01/07/2020 0919   BUN 16 09/21/2019 1144   CREATININE 0.81 01/07/2020 0919   CREATININE 0.95 10/06/2017 1208   GLU 102 04/04/2015 0000      Component Value Date/Time   CALCIUM 9.4 01/07/2020 0919   ALKPHOS 57 01/07/2020 0919   AST 19 01/07/2020 0919   ALT 12 01/07/2020 0919   BILITOT 0.5 01/07/2020 0919       RADIOGRAPHIC STUDIES: CT ANGIO HEAD W OR WO CONTRAST  Result Date: 12/16/2019 CLINICAL DATA:  Carotid artery stenosis. Previous right MCA infarction. EXAM: CT ANGIOGRAPHY HEAD AND NECK TECHNIQUE: Multidetector CT imaging of the head and neck was performed using the standard protocol during bolus administration of intravenous contrast. Multiplanar CT image reconstructions and MIPs were obtained to evaluate the vascular anatomy. Carotid stenosis measurements (when applicable) are obtained utilizing NASCET  criteria, using the distal internal carotid diameter as the denominator. CONTRAST:  103mL ISOVUE-370 IOPAMIDOL (ISOVUE-370) INJECTION 76% COMPARISON:  06/26/2019 FINDINGS: CT HEAD FINDINGS Brain: No focal finding affects the brainstem or cerebellum. Old infarction in the right middle cerebral artery territory has progressed to atrophy and encephalomalacia particularly affecting the right temporal lobe, insula and right frontal operculum. No sign of recent infarction, mass lesion, hemorrhage, hydrocephalus or extra-axial collection. Vascular: There is atherosclerotic calcification of the major vessels at the base of the brain. Skull: Negative Sinuses: Clear Orbits: Normal Review of the MIP images confirms the above findings CTA NECK FINDINGS Aortic arch: Aortic atherosclerosis. No aneurysm or dissection. Branching pattern is normal without origin stenosis. Right carotid system: Common carotid artery shows atherosclerotic change but is widely patent to the bifurcation region. There is motion degradation in the midportion. At the carotid bifurcation, there is calcified plaque affecting the bifurcation and ICA bulb but no stenosis. Cervical ICA widely patent beyond that. Calcified plaque present just beneath  the skull base but no stenosis. Left carotid system: Common carotid artery is widely patent to the bifurcation region. Carotid bifurcation shows calcified plaque at the bifurcation and ICA bulb. Minimal diameter of the ICA bulb on this side measures 4 mm. Compared to a more distal cervical ICA diameter of 5 mm, this indicates a 20% stenosis. Vertebral arteries: There is atherosclerotic plaque at both vertebral artery origins. Stenosis estimated at 30%. Atherosclerotic plaque affects the proximal vertebral arteries as well. Beyond that, both vertebral arteries are patent with scattered foci of calcified plaque. No flow limiting stenosis suspected. Skeleton: Ordinary degenerative spondylosis. Other neck: No mass or  lymphadenopathy. Upper chest: Negative Review of the MIP images confirms the above findings CTA HEAD FINDINGS Anterior circulation: Both internal carotid arteries are patent through the skull base and siphon regions. There is atherosclerotic calcification in the carotid siphon regions but without stenosis greater than 30%. The anterior and middle cerebral vessels are patent without proximal stenosis, aneurysm or vascular malformation. More distal branch vessels show some atherosclerotic irregularity. Posterior circulation: Both vertebral arteries are patent at and through the foramen magnum to the basilar. No basilar stenosis. Posterior circulation branch vessels are patent. Mild atherosclerotic irregularity of the more distal PCA branches. Primary fetal origin of the right PCA. Venous sinuses: Patent and normal. Anatomic variants: None significant. Review of the MIP images confirms the above findings IMPRESSION: Expected evolution of infarction in the right MCA territory to areas of atrophy and encephalomalacia. Aortic atherosclerosis as seen previously. Atherosclerotic disease at both carotid bifurcations as seen previously. No measurable stenosis on the right. 20% stenosis of the left ICA bulb. No intracranial large or medium vessel occlusion presently. Some distal vessel atherosclerotic irregularity in both the anterior and posterior circulation vessels. Electronically Signed   By: Nelson Chimes M.D.   On: 12/16/2019 17:41   CT ANGIO NECK W OR WO CONTRAST  Result Date: 12/16/2019 CLINICAL DATA:  Carotid artery stenosis. Previous right MCA infarction. EXAM: CT ANGIOGRAPHY HEAD AND NECK TECHNIQUE: Multidetector CT imaging of the head and neck was performed using the standard protocol during bolus administration of intravenous contrast. Multiplanar CT image reconstructions and MIPs were obtained to evaluate the vascular anatomy. Carotid stenosis measurements (when applicable) are obtained utilizing NASCET  criteria, using the distal internal carotid diameter as the denominator. CONTRAST:  28mL ISOVUE-370 IOPAMIDOL (ISOVUE-370) INJECTION 76% COMPARISON:  06/26/2019 FINDINGS: CT HEAD FINDINGS Brain: No focal finding affects the brainstem or cerebellum. Old infarction in the right middle cerebral artery territory has progressed to atrophy and encephalomalacia particularly affecting the right temporal lobe, insula and right frontal operculum. No sign of recent infarction, mass lesion, hemorrhage, hydrocephalus or extra-axial collection. Vascular: There is atherosclerotic calcification of the major vessels at the base of the brain. Skull: Negative Sinuses: Clear Orbits: Normal Review of the MIP images confirms the above findings CTA NECK FINDINGS Aortic arch: Aortic atherosclerosis. No aneurysm or dissection. Branching pattern is normal without origin stenosis. Right carotid system: Common carotid artery shows atherosclerotic change but is widely patent to the bifurcation region. There is motion degradation in the midportion. At the carotid bifurcation, there is calcified plaque affecting the bifurcation and ICA bulb but no stenosis. Cervical ICA widely patent beyond that. Calcified plaque present just beneath the skull base but no stenosis. Left carotid system: Common carotid artery is widely patent to the bifurcation region. Carotid bifurcation shows calcified plaque at the bifurcation and ICA bulb. Minimal diameter of the ICA bulb on this side  measures 4 mm. Compared to a more distal cervical ICA diameter of 5 mm, this indicates a 20% stenosis. Vertebral arteries: There is atherosclerotic plaque at both vertebral artery origins. Stenosis estimated at 30%. Atherosclerotic plaque affects the proximal vertebral arteries as well. Beyond that, both vertebral arteries are patent with scattered foci of calcified plaque. No flow limiting stenosis suspected. Skeleton: Ordinary degenerative spondylosis. Other neck: No mass or  lymphadenopathy. Upper chest: Negative Review of the MIP images confirms the above findings CTA HEAD FINDINGS Anterior circulation: Both internal carotid arteries are patent through the skull base and siphon regions. There is atherosclerotic calcification in the carotid siphon regions but without stenosis greater than 30%. The anterior and middle cerebral vessels are patent without proximal stenosis, aneurysm or vascular malformation. More distal branch vessels show some atherosclerotic irregularity. Posterior circulation: Both vertebral arteries are patent at and through the foramen magnum to the basilar. No basilar stenosis. Posterior circulation branch vessels are patent. Mild atherosclerotic irregularity of the more distal PCA branches. Primary fetal origin of the right PCA. Venous sinuses: Patent and normal. Anatomic variants: None significant. Review of the MIP images confirms the above findings IMPRESSION: Expected evolution of infarction in the right MCA territory to areas of atrophy and encephalomalacia. Aortic atherosclerosis as seen previously. Atherosclerotic disease at both carotid bifurcations as seen previously. No measurable stenosis on the right. 20% stenosis of the left ICA bulb. No intracranial large or medium vessel occlusion presently. Some distal vessel atherosclerotic irregularity in both the anterior and posterior circulation vessels. Electronically Signed   By: Nelson Chimes M.D.   On: 12/16/2019 17:41   CT CHEST WO CONTRAST  Result Date: 01/05/2020 CLINICAL DATA:  Lung nodule follow-up. EXAM: CT CHEST WITHOUT CONTRAST TECHNIQUE: Multidetector CT imaging of the chest was performed following the standard protocol without IV contrast. COMPARISON:  CTA neck dated June 26, 2019. Octreotide CT scan dated April 06, 2012. FINDINGS: Cardiovascular: Normal heart size. No pericardial effusion. No thoracic aortic aneurysm. Coronary, aortic arch, and branch vessel atherosclerotic vascular disease.  Mediastinum/Nodes: No enlarged mediastinal or axillary lymph nodes. Unchanged 10 mm partially calcified hypodense nodule in the right thyroid gland. No followup recommended. The trachea and esophagus demonstrate no significant findings. Lungs/Pleura: Interval increase in size of the pleural based right upper lobe nodule, now measuring 11 x 9 mm, previously 6 x 5 mm. Previously seen ground-glass densities in the upper lobes have resolved. Focal scarring in the peripheral bilateral basal lower lobes. No focal consolidation, pleural effusion, or pneumothorax. Small calcified granuloma in the anterior right upper lobe. Upper Abdomen: No acute abnormality. Right renal cysts. Small right adrenal adenoma. Musculoskeletal: No chest wall mass or suspicious bone lesions identified. IMPRESSION: 1. Interval increase in size of the pleural based right upper lobe nodule, now measuring 11 x 9 mm, previously 6 x 5 mm. This nodule is concerning for primary lung cancer. Consultation with pulmonary medicine or thoracic surgery is suggested, as clinically appropriate. 2. Previously seen ground-glass densities in the upper lobes have resolved and were likely related to patient's prior COVID-19 infection. 3.  Aortic atherosclerosis (ICD10-I70.0). These results will be called to the ordering clinician or representative by the Radiologist Assistant, and communication documented in the PACS or zVision Dashboard. Electronically Signed   By: Titus Dubin M.D.   On: 01/05/2020 13:21    ASSESSMENT: This is a very pleasant 84 years old African-American male with highly suspicious stage Ia lung cancer presented with pleural-based right upper lobe lung nodule.  PLAN: I had a lengthy discussion with the patient today about his current disease stage as well as further investigation to confirm his diagnosis and treatment option. I recommended for the patient to have a PET scan for further evaluation of his disease and to rule out any  metastatic lesion. If the patient has hypermetabolic activity in the right upper lobe lung nodule and no other evidence for metastatic disease, will refer the patient to cardiothoracic surgery for evaluation of surgical resection. We will arrange for the patient to come back to the multidisciplinary thoracic oncology clinic in around 2 weeks for evaluation and further discussion of his treatment options based on the PET scan results. He was advised to call immediately if he has any other concerning symptoms in the interval. The patient voices understanding of current disease status and treatment options and is in agreement with the current care plan.  All questions were answered. The patient knows to call the clinic with any problems, questions or concerns. We can certainly see the patient much sooner if necessary.  Thank you so much for allowing me to participate in the care of [facility].. I will continue to follow up the patient with you and assist in his care.  The total time spent in the appointment was 60 minutes.  Disclaimer: This note was dictated with voice recognition software. Similar sounding words can inadvertently be transcribed and may not be corrected upon review.   Eilleen Kempf January 07, 2020, 9:58 AM

## 2020-01-10 ENCOUNTER — Telehealth: Payer: Self-pay | Admitting: *Deleted

## 2020-01-10 NOTE — Telephone Encounter (Signed)
Oncology Nurse Navigator Documentation  Oncology Nurse Navigator Flowsheets 01/10/2020  Navigator Location [facility]  Navigator Encounter Type Telephone;Other:  Telephone Outgoing Call/I was updated that patient's PET scan was authed.  I called central scheduling and was able to get PET scheduled.  I called and spoke with daughter and update her on appt for PET, pre-procedure instructions, and appt for [facility] on 2/4.  She verbalized understanding of the above information.   Patient Visit Type Follow-up  Treatment Phase Abnormal Scans  Barriers/Navigation Needs Coordination of Care;Education  Education Other  Interventions Coordination of Care;Education  Acuity Level 3-Moderate Needs (3-4 Barriers Identified)  Coordination of Care Appts  Education Method Verbal  Time Spent with Patient 30

## 2020-01-18 ENCOUNTER — Other Ambulatory Visit: Payer: Self-pay

## 2020-01-18 ENCOUNTER — Ambulatory Visit (HOSPITAL_COMMUNITY)
Admission: RE | Admit: 2020-01-18 | Discharge: 2020-01-18 | Disposition: A | Discharge status: Home / Self Care | Payer: Medicare Other | Source: Ambulatory Visit | Attending: Internal Medicine | Admitting: Internal Medicine

## 2020-01-18 DIAGNOSIS — Z8673 Personal history of transient ischemic attack (TIA), and cerebral infarction without residual deficits: Secondary | ICD-10-CM | POA: Insufficient documentation

## 2020-01-18 DIAGNOSIS — N433 Hydrocele, unspecified: Secondary | ICD-10-CM | POA: Insufficient documentation

## 2020-01-18 DIAGNOSIS — I2510 Atherosclerotic heart disease of native coronary artery without angina pectoris: Secondary | ICD-10-CM | POA: Insufficient documentation

## 2020-01-18 DIAGNOSIS — I700 Atherosclerosis of aorta: Secondary | ICD-10-CM | POA: Diagnosis not present

## 2020-01-18 DIAGNOSIS — R911 Solitary pulmonary nodule: Secondary | ICD-10-CM | POA: Diagnosis not present

## 2020-01-18 LAB — GLUCOSE, CAPILLARY: Glucose-Capillary: 89 mg/dL (ref 70–99)

## 2020-01-18 MED ORDER — FLUDEOXYGLUCOSE F - 18 (FDG) INJECTION
10.1000 | Freq: Once | INTRAVENOUS | Status: AC | PRN
  Administered 2020-01-18: 10.1 via INTRAVENOUS

## 2020-01-20 ENCOUNTER — Ambulatory Visit
Admission: RE | Admit: 2020-01-20 | Discharge: 2020-01-20 | Disposition: A | Discharge status: Home / Self Care | Payer: Medicare Other | Source: Ambulatory Visit | Attending: Radiation Oncology | Admitting: Radiation Oncology

## 2020-01-20 ENCOUNTER — Encounter: Payer: Self-pay | Admitting: Thoracic Surgery (Cardiothoracic Vascular Surgery)

## 2020-01-20 ENCOUNTER — Other Ambulatory Visit: Payer: Self-pay

## 2020-01-20 ENCOUNTER — Institutional Professional Consult (permissible substitution) (INDEPENDENT_AMBULATORY_CARE_PROVIDER_SITE_OTHER): Payer: Medicare Other | Admitting: Thoracic Surgery (Cardiothoracic Vascular Surgery)

## 2020-01-20 ENCOUNTER — Other Ambulatory Visit: Payer: Self-pay | Admitting: *Deleted

## 2020-01-20 VITALS — BP 150/76 | HR 91 | Temp 98.3°F | Resp 20 | Wt 206.5 lb

## 2020-01-20 DIAGNOSIS — R911 Solitary pulmonary nodule: Secondary | ICD-10-CM

## 2020-01-20 DIAGNOSIS — Z87891 Personal history of nicotine dependence: Secondary | ICD-10-CM | POA: Diagnosis not present

## 2020-01-20 NOTE — Progress Notes (Signed)
The proposed treatment discussed in cancer conference 01/20/20 is for discussion only and is not a binding recommendation.  The patient was not physically examined nor present for their treatment options. Therefore, final treatment plans cannot be decided.

## 2020-01-20 NOTE — Progress Notes (Signed)
PCP is [name], MD Referring Provider is Curt Bears, MD  No chief complaint on file.   HPI: Travis Salazar is sent for consultation re: RUL nodule  Travis Salazar is an 84 yo man with a past history of hypertension, hyperlipidemia, remote tobacco abuse (1/2 ppd x 50 years), stroke, and moderate AS. Found to have a 6 mm RUL nodule on a CT of the head and neck when he had a stroke in July 2020. Right MCA infarct with left sided weakness, but has regained essentially all function.  A follow up CT in January showed the nodule had increased in size to 11 x 9 mm. Referred to Dr. Julien Nordmann. PET was done which showed the nodule was mildly hypermetabolic with no evidence of metastatic disease.   Quit smoking in 2010. No cough, wheezing or shortness of breath. Denies chest pain, pressure or tightness. No change in appetite, weight loss, or change in activity.  Zubrod Score: At the time of surgery this patient's most appropriate activity status/level should be described as: []     0    Normal activity, no symptoms [x]     1    Restricted in physical strenuous activity but ambulatory, able to do out light work []     2    Ambulatory and capable of self care, unable to do work activities, up and about >50 % of waking hours                              []     3    Only limited self care, in bed greater than 50% of waking hours []     4    Completely disabled, no self care, confined to bed or chair []     5    Moribund  Past Medical History:  Diagnosis Date  . Hyperlipidemia   . Hypertension     Past Surgical History:  Procedure Laterality Date  . LIPOMA EXCISION     neck  . PROSTATECTOMY     due to prostate cancer    Family History  Problem Relation Age of Onset  . COPD Mother   . Heart attack Father     Social History Social History   Tobacco Use  . Smoking status: Former Smoker    Packs/day: 1.00    Years: 50.00    Pack years: 50.00    Quit date: 12/15/2009    Years since  quitting: 10.1  . Smokeless tobacco: Never Used  Substance Use Topics  . Alcohol use: No    Alcohol/week: 0.0 standard drinks  . Drug use: No    Current Outpatient Medications  Medication Sig Dispense Refill  . allopurinol (ZYLOPRIM) 300 MG tablet TAKE 1 TABLET BY MOUTH EVERY DAY (Patient taking differently: Take 300 mg by mouth as needed. ) 90 tablet 3  . amLODipine (NORVASC) 10 MG tablet TAKE 1 TABLET BY MOUTH EVERY DAY 90 tablet 3  . aspirin EC 325 MG EC tablet Take 1 tablet (325 mg total) by mouth daily. 90 tablet 1  . atorvastatin (LIPITOR) 40 MG tablet Take 1 tablet (40 mg total) by mouth daily at 6 PM. 90 tablet 1  . clopidogrel (PLAVIX) 75 MG tablet Take 1 tablet (75 mg total) by mouth daily. 90 tablet 1  . colchicine 0.6 MG tablet Take 1 tablet (0.6 mg total) by mouth daily. 60 tablet 5  . ketorolac (ACULAR) 0.5 % ophthalmic  solution Place 1 drop into both eyes daily.    Marland Kitchen lisinopril (ZESTRIL) 40 MG tablet Take 1 tablet (40 mg total) by mouth daily. 90 tablet 3  . SIMBRINZA 1-0.2 % SUSP Place 1 drop into both eyes 3 (three) times daily.     No current facility-administered medications for this visit.    No Known Allergies  Review of Systems  Constitutional: Negative for activity change and appetite change.  HENT: Negative for trouble swallowing and voice change.   Respiratory: Negative for shortness of breath.   Cardiovascular: Negative for chest pain, palpitations and leg swelling.    BP (!) 150/76   Pulse 91   Temp 98.3 F (36.8 C)   Resp 20   Wt 206 lb 8 oz (93.7 kg)   SpO2 100%   BMI 30.49 kg/m  Physical Exam Constitutional:      General: He is not in acute distress.    Appearance: Normal appearance.  HENT:     Head: Normocephalic and atraumatic.  Eyes:     General: No scleral icterus.    Extraocular Movements: Extraocular movements intact.  Cardiovascular:     Rate and Rhythm: Normal rate.     Heart sounds: Murmur (2/6 crescendo/ decrescendo RUSB)  present. No friction rub. No gallop.   Pulmonary:     Effort: Pulmonary effort is normal. No respiratory distress.     Breath sounds: No wheezing or rales.  Musculoskeletal:        General: No swelling.  Lymphadenopathy:     Cervical: No cervical adenopathy.  Skin:    General: Skin is warm and dry.  Neurological:     General: No focal deficit present.     Mental Status: He is alert and oriented to person, place, and time.     Cranial Nerves: No cranial nerve deficit.     Motor: No weakness (mild left sided).  Psychiatric:        Judgment: Judgment normal.    Diagnostic Tests: CT CHEST WITHOUT CONTRAST  TECHNIQUE: Multidetector CT imaging of the chest was performed following the standard protocol without IV contrast.  COMPARISON:  CTA neck dated June 26, 2019. Octreotide CT scan dated April 06, 2012.  FINDINGS: Cardiovascular: Normal heart size. No pericardial effusion. No thoracic aortic aneurysm. Coronary, aortic arch, and branch vessel atherosclerotic vascular disease.  Mediastinum/Nodes: No enlarged mediastinal or axillary lymph nodes. Unchanged 10 mm partially calcified hypodense nodule in the right thyroid gland. No followup recommended. The trachea and esophagus demonstrate no significant findings.  Lungs/Pleura: Interval increase in size of the pleural based right upper lobe nodule, now measuring 11 x 9 mm, previously 6 x 5 mm. Previously seen ground-glass densities in the upper lobes have resolved. Focal scarring in the peripheral bilateral basal lower lobes. No focal consolidation, pleural effusion, or pneumothorax. Small calcified granuloma in the anterior right upper lobe.  Upper Abdomen: No acute abnormality. Right renal cysts. Small right adrenal adenoma.  Musculoskeletal: No chest wall mass or suspicious bone lesions identified.  IMPRESSION: 1. Interval increase in size of the pleural based right upper lobe nodule, now measuring 11 x 9 mm,  previously 6 x 5 mm. This nodule is concerning for primary lung cancer. Consultation with pulmonary medicine or thoracic surgery is suggested, as clinically appropriate. 2. Previously seen ground-glass densities in the upper lobes have resolved and were likely related to patient's prior COVID-19 infection. 3.  Aortic atherosclerosis (ICD10-I70.0).  These results will be called to the ordering clinician  or representative by the Radiologist Assistant, and communication documented in the PACS or zVision Dashboard.   Electronically Signed   By: Titus Dubin M.D.   On: 01/05/2020 13:21 NUCLEAR MEDICINE PET SKULL BASE TO THIGH  TECHNIQUE: 10.1 mCi F-18 FDG was injected intravenously. Full-ring PET imaging was performed from the skull base to thigh after the radiotracer. CT data was obtained and used for attenuation correction and anatomic localization.  Fasting blood glucose: 89 mg/dl  COMPARISON:  Chest CT 01/05/2020  FINDINGS: Mediastinal blood pool activity: SUV max 2.5  Liver activity: SUV max NA  NECK: Hypoactivity in the right MCA distribution of the brain compatible with remote infarct. Left eccentric mandibular activity compatible with tooth decay. Bilateral palatine tonsillar activity, maximum SUV 9.0 on the left and 7.1 on the right, region is obscured on the CT data due to motion artifact, but this activity is probably physiologic. There is also physiologic muscular activity in the upper neck.  Incidental CT findings: Mucous retention cyst in the right maxillary sinus. Right MCA distribution encephalomalacia. Bilateral common carotid atherosclerotic calcifications.  CHEST: The right upper lobe nodule has a maximum SUV of 2.1 and measures 1.1 by 1.0 cm on image 61/4. This abuts the pleural margin laterally.  There is accentuated activity in the distal esophagus with maximum SUV 6.3  Faintly accentuated activity at the left hilum, maximum SUV  4.5, although on the CT data is difficult to differentiate any lymph node from the adjacent vasculature due to lack of IV contrast.  Incidental CT findings: Sub-centimeter right thyroid nodule is not hypermetabolic and does not require follow up.  Coronary, aortic arch, and branch vessel atherosclerotic vascular disease. Calcified granuloma in the right upper lobe, image 37/9. Bandlike scarring/atelectasis mildly reduced in prominence laterally in the right lower lobe. Mild atelectasis in the left lower lobe, stable.  ABDOMEN/PELVIS: Photopenic cysts of the right kidney upper pole. No adrenal mass.  Incidental CT findings: Bilateral scrotal hydroceles. Aortoiliac atherosclerotic vascular disease. Superficial right iliopsoas lipoma.  SKELETON: No significant abnormal hypermetabolic activity in this region.  Incidental CT findings: Cervical, thoracic, lumbar spondylosis. Lumbar degenerative disc disease.  IMPRESSION: 1. The rounded subpleural right upper lobe enlarging lung nodule has a maximum SUV of 2.1. In light of the definitive enlargement of last 7 months as well as this low-grade but accentuated activity, this probably represents a small low-grade adenocarcinoma, and less likely some sort of chronic inflammatory nodule. Resection or tissue diagnosis recommended. 2. Evidence of old right MCA distribution stroke. 3. Bilateral accentuated palatine tonsillar activity, probably physiologic. 4. Mucous retention cyst in the right maxillary sinus. 5.  Aortic Atherosclerosis (ICD10-I70.0).  Coronary atherosclerosis. 6. Bilateral scrotal hydroceles.   Electronically Signed   By: Van Clines M.D.   On: 01/18/2020 16:34 I personally reviewed the CT and PET images and concur with above  Impression: Queen Weyman is an 84 yo man with a history of tobacco abuse, hypertension, hyperlipidemia, stroke with minimal residual, and asymptomatic mild to moderate AS who was  found to have a lung nodule about 6 months ago. That nodule has grown in the interim. On PET there is mild hypermetabolic activity. This is highly concerning for a primary bronchogenic carcinoma and has to be considered that unless it can be proven otherwise.  I had a long discussion with Travis Salazar and reviewed the films with him. I discussed the possibility of surgical resection vs radiation with him. Although he is 36 and had a stroke about 6  months ago, his functional status is very good and he is a surgical candidate. I would favor a wedge resection in his case. I informed him of the indications, risks, benefits and alternatives. He understands the risks include but are not limited to death, stroke, MI, DVT, PE, bleeding, possible transfusion, infection, as well as other unforeseeable complications.  Travis Salazar had already made up his mind that he was not going to anything about the nodule as it was not causing him any problems. Apparently he had some complications after a prostatectomy.   I explained why we want to address this before it becomes symptomatic and he understands the reasoning but it did not change his mind.  He was agreeable to a repeat CT in 3 months but nothing more than that.  Plan: Will meet with Dr. Sondra Come this afternoon If he changes his mind we can schedule him for a robotic wedge resection. If he declines treatment will repeat a CT in 3 months.  Melrose Nakayama, MD [facility] [phone]

## 2020-01-20 NOTE — Progress Notes (Signed)
Radiation Oncology         [phone] ________________________________  Multidisciplinary Thoracic Oncology Clinic [facility]) Initial Outpatient Consultation  Name: Travis Salazar. MRN: [id number]  Date: 01/20/2020  DOB: 06-13-1936  KT:[id number], Retia Passe., MD  Curt Bears, MD   REFERRING PHYSICIAN: Curt Bears, MD  DIAGNOSIS: The encounter diagnosis was Lung nodule.  PET-Positive RUL Pulmonary Nodule  HISTORY OF PRESENT ILLNESS::Travis Kiara Keep. is a 84 y.o. male with a history of prostate cancer s/p prostatectomy in 2000 and long history of smoking with cessation in 2010. More recently, he experienced a stroke in 06/2019 and was found to be Covid-19 positive at the same time. A CT angio head and neck performed at that time incidentally found a 6 mm pleural-based right upper lobe pulmonary nodule. 6-12 month follow up was recommended.   Performed on 01/05/2020, follow up chest CT revealed: interval increase in size of the pleural-based RUL nodule, now 11 mm and concerning for primary lung cancer. Of note, prior densities likely related to his Covid-19 infection had resolved.   He was referred to Dr. Julien Nordmann on 01/07/2020 who recommended PET scan. Performed on 01/18/2020, PET scan revealed: round subpleural RUL enlarging nodule with maximum SUV of 2.1, likely representing a small low-grade adenocarcinoma; evidence of old right MCA distribution stroke; probably physiologic bilateral accentuated palatine tonsillar activity.  The patient was referred today for presentation in the multidisciplinary conference.  Radiology studies and pathology slides were presented there for review and discussion of treatment options.  A consensus was discussed regarding potential next steps.  PREVIOUS RADIATION THERAPY: No  PAST MEDICAL HISTORY:  has a past medical history of Hyperlipidemia and Hypertension.    PAST SURGICAL HISTORY: Past Surgical History:  Procedure Laterality Date   LIPOMA EXCISION      neck   PROSTATECTOMY     due to prostate cancer    FAMILY HISTORY: family history includes COPD in his mother; Heart attack in his father.  SOCIAL HISTORY:  reports that he quit smoking about 10 years ago. He has a 50.00 pack-year smoking history. He has never used smokeless tobacco. He reports that he does not drink alcohol or use drugs.  ALLERGIES: Patient has no known allergies.  MEDICATIONS:  Current Outpatient Medications  Medication Sig Dispense Refill   allopurinol (ZYLOPRIM) 300 MG tablet TAKE 1 TABLET BY MOUTH EVERY DAY (Patient taking differently: Take 300 mg by mouth as needed. ) 90 tablet 3   amLODipine (NORVASC) 10 MG tablet TAKE 1 TABLET BY MOUTH EVERY DAY 90 tablet 3   aspirin EC 325 MG EC tablet Take 1 tablet (325 mg total) by mouth daily. 90 tablet 1   atorvastatin (LIPITOR) 40 MG tablet Take 1 tablet (40 mg total) by mouth daily at 6 PM. 90 tablet 1   clopidogrel (PLAVIX) 75 MG tablet Take 1 tablet (75 mg total) by mouth daily. 90 tablet 1   colchicine 0.6 MG tablet Take 1 tablet (0.6 mg total) by mouth daily. 60 tablet 5   ketorolac (ACULAR) 0.5 % ophthalmic solution Place 1 drop into both eyes daily.     lisinopril (ZESTRIL) 40 MG tablet Take 1 tablet (40 mg total) by mouth daily. 90 tablet 3   SIMBRINZA 1-0.2 % SUSP Place 1 drop into both eyes 3 (three) times daily.     No current facility-administered medications for this encounter.    REVIEW OF SYSTEMS:  A 15 point review of systems is documented in the electronic  medical record. This was obtained by the nursing staff. However, I reviewed this with the patient to discuss relevant findings and make appropriate changes.  He denies any pain within the chest area significant cough or hemoptysis   PHYSICAL EXAM:  weight is 206 lb 8 oz (93.7 kg). His temperature is 98.3 F (36.8 C). His blood pressure is 150/76 (abnormal) and his pulse is 91. His respiration is 20 and oxygen saturation is 100%.     General: Alert and oriented, in no acute distress HEENT: Head is normocephalic. Extraocular movements are intact. Oropharynx is clear. Neck: Neck is supple, no palpable cervical or supraclavicular lymphadenopathy. Heart: Regular in rate and rhythm with no murmurs, rubs, or gallops. Chest: Clear to auscultation bilaterally, with no rhonchi, wheezes, or rales. Abdomen: Soft, nontender, nondistended, with no rigidity or guarding. Extremities: No cyanosis or edema. Lymphatics: see Neck Exam Skin: No concerning lesions. Musculoskeletal: symmetric strength and muscle tone throughout. Neurologic: Cranial nerves II through XII are grossly intact. No obvious focalities. Speech is fluent. Coordination is intact.  No focal deficits noted Psychiatric: Judgment and insight are intact. Affect is appropriate.    KPS = 90  100 - Normal; no complaints; no evidence of disease. 90   - Able to carry on normal activity; minor signs or symptoms of disease. 80   - Normal activity with effort; some signs or symptoms of disease. 14   - Cares for self; unable to carry on normal activity or to do active work. 60   - Requires occasional assistance, but is able to care for most of his personal needs. 50   - Requires considerable assistance and frequent medical care. 32   - Disabled; requires special care and assistance. 66   - Severely disabled; hospital admission is indicated although death not imminent. 65   - Very sick; hospital admission necessary; active supportive treatment necessary. 10   - Moribund; fatal processes progressing rapidly. 0     - Dead  Karnofsky DA, Abelmann Ryan, Craver LS and Burchenal [facility] [phone]) The use of the nitrogen mustards in the palliative treatment of carcinoma: with particular reference to bronchogenic carcinoma Cancer 1 [id number]  LABORATORY DATA:  Lab Results  Component Value Date   WBC 7.2 01/07/2020   HGB 13.9 01/07/2020   HCT 43.1 01/07/2020   MCV 90.4 01/07/2020   PLT 158  01/07/2020   Lab Results  Component Value Date   NA 142 01/07/2020   K 3.9 01/07/2020   CL 106 01/07/2020   CO2 26 01/07/2020   Lab Results  Component Value Date   ALT 12 01/07/2020   AST 19 01/07/2020   ALKPHOS 57 01/07/2020   BILITOT 0.5 01/07/2020    PULMONARY FUNCTION TEST:   Recent Review Flowsheet Data    There is no flowsheet data to display.      RADIOGRAPHY: CT CHEST WO CONTRAST  Result Date: 01/05/2020 CLINICAL DATA:  Lung nodule follow-up. EXAM: CT CHEST WITHOUT CONTRAST TECHNIQUE: Multidetector CT imaging of the chest was performed following the standard protocol without IV contrast. COMPARISON:  CTA neck dated June 26, 2019. Octreotide CT scan dated April 06, 2012. FINDINGS: Cardiovascular: Normal heart size. No pericardial effusion. No thoracic aortic aneurysm. Coronary, aortic arch, and branch vessel atherosclerotic vascular disease. Mediastinum/Nodes: No enlarged mediastinal or axillary lymph nodes. Unchanged 10 mm partially calcified hypodense nodule in the right thyroid gland. No followup recommended. The trachea and esophagus demonstrate no significant findings. Lungs/Pleura: Interval increase in size of  the pleural based right upper lobe nodule, now measuring 11 x 9 mm, previously 6 x 5 mm. Previously seen ground-glass densities in the upper lobes have resolved. Focal scarring in the peripheral bilateral basal lower lobes. No focal consolidation, pleural effusion, or pneumothorax. Small calcified granuloma in the anterior right upper lobe. Upper Abdomen: No acute abnormality. Right renal cysts. Small right adrenal adenoma. Musculoskeletal: No chest wall mass or suspicious bone lesions identified. IMPRESSION: 1. Interval increase in size of the pleural based right upper lobe nodule, now measuring 11 x 9 mm, previously 6 x 5 mm. This nodule is concerning for primary lung cancer. Consultation with pulmonary medicine or thoracic surgery is suggested, as clinically  appropriate. 2. Previously seen ground-glass densities in the upper lobes have resolved and were likely related to patient's prior COVID-19 infection. 3.  Aortic atherosclerosis (ICD10-I70.0). These results will be called to the ordering clinician or representative by the Radiologist Assistant, and communication documented in the PACS or zVision Dashboard. Electronically Signed   By: Titus Dubin M.D.   On: 01/05/2020 13:21   NM PET Image Initial (PI) Skull Base To Thigh  Result Date: 01/18/2020 CLINICAL DATA:  Initial treatment strategy for lung nodule. EXAM: NUCLEAR MEDICINE PET SKULL BASE TO THIGH TECHNIQUE: 10.1 mCi F-18 FDG was injected intravenously. Full-ring PET imaging was performed from the skull base to thigh after the radiotracer. CT data was obtained and used for attenuation correction and anatomic localization. Fasting blood glucose: 89 mg/dl COMPARISON:  Chest CT 01/05/2020 FINDINGS: Mediastinal blood pool activity: SUV max 2.5 Liver activity: SUV max NA NECK: Hypoactivity in the right MCA distribution of the brain compatible with remote infarct. Left eccentric mandibular activity compatible with tooth decay. Bilateral palatine tonsillar activity, maximum SUV 9.0 on the left and 7.1 on the right, region is obscured on the CT data due to motion artifact, but this activity is probably physiologic. There is also physiologic muscular activity in the upper neck. Incidental CT findings: Mucous retention cyst in the right maxillary sinus. Right MCA distribution encephalomalacia. Bilateral common carotid atherosclerotic calcifications. CHEST: The right upper lobe nodule has a maximum SUV of 2.1 and measures 1.1 by 1.0 cm on image 61/4. This abuts the pleural margin laterally. There is accentuated activity in the distal esophagus with maximum SUV 6.3 Faintly accentuated activity at the left hilum, maximum SUV 4.5, although on the CT data is difficult to differentiate any lymph node from the adjacent  vasculature due to lack of IV contrast. Incidental CT findings: Sub-centimeter right thyroid nodule is not hypermetabolic and does not require follow up. Coronary, aortic arch, and branch vessel atherosclerotic vascular disease. Calcified granuloma in the right upper lobe, image 37/9. Bandlike scarring/atelectasis mildly reduced in prominence laterally in the right lower lobe. Mild atelectasis in the left lower lobe, stable. ABDOMEN/PELVIS: Photopenic cysts of the right kidney upper pole. No adrenal mass. Incidental CT findings: Bilateral scrotal hydroceles. Aortoiliac atherosclerotic vascular disease. Superficial right iliopsoas lipoma. SKELETON: No significant abnormal hypermetabolic activity in this region. Incidental CT findings: Cervical, thoracic, lumbar spondylosis. Lumbar degenerative disc disease. IMPRESSION: 1. The rounded subpleural right upper lobe enlarging lung nodule has a maximum SUV of 2.1. In light of the definitive enlargement of last 7 months as well as this low-grade but accentuated activity, this probably represents a small low-grade adenocarcinoma, and less likely some sort of chronic inflammatory nodule. Resection or tissue diagnosis recommended. 2. Evidence of old right MCA distribution stroke. 3. Bilateral accentuated palatine tonsillar activity, probably  physiologic. 4. Mucous retention cyst in the right maxillary sinus. 5.  Aortic Atherosclerosis (ICD10-I70.0).  Coronary atherosclerosis. 6. Bilateral scrotal hydroceles. Electronically Signed   By: Van Clines M.D.   On: 01/18/2020 16:34      IMPRESSION: PET-Positive RUL Pulmonary Nodule  Patient's case was reviewed at the multidisciplinary conference earlier today.  Options to consider the surgery, potential CT-guided biopsy followed by stereotactic body radiation therapy.  Is also discussed consideration for stereotactic body radiation therapy without tissue diagnosis.  I discussed the general options for management.  Patient  also met with Dr. Roxan Hockey who focused on the surgical approach.  At this time the patient does not wish to pursue definitive treatment of his presumed early stage malignancy.  He is however willing to proceed with a repeat chest CT scan in 3 months.  If the lesion  enlarges then we will reconsider treatment at that time.  PLAN: Patient will proceed with a chest CT scan in 3 months and follow-up with Dr. Roxan Hockey at that time.     ------------------------------------------------  Blair Promise, PhD, MD  This document serves as a record of services personally performed by Gery Pray, MD. It was created on his behalf by Wilburn Mylar, a trained medical scribe. The creation of this record is based on the scribe's personal observations and the provider's statements to them. This document has been checked and approved by the attending provider.

## 2020-01-21 ENCOUNTER — Encounter: Payer: Self-pay | Admitting: *Deleted

## 2020-01-21 NOTE — Progress Notes (Signed)
Oncology Nurse Navigator Documentation  Oncology Nurse Navigator Flowsheets 01/21/2020  Navigator Location [facility]  Navigator Encounter Type Clinic/MDC/spoke with patient at thoracic clinic yesterday.  He is not interested in having any interventions at this time.  Dr. Roxan Hockey would like to see him back in 3 months with a scan.  I updated patient on his plan of care and faxed orders to Dr. Leonarda Salon office.   Telephone -  [facility] Date 01/20/2020  Multidisiplinary Clinic Type Thoracic  Patient Visit Type MedOnc  Treatment Phase Abnormal Scans  Barriers/Navigation Needs Coordination of Care;Education  Education Other  Interventions Coordination of Care;Education  Acuity Level 2-Minimal Needs (1-2 Barriers Identified)  Coordination of Care Other  Education Method Verbal  Time Spent with Patient 30

## 2020-01-21 NOTE — Progress Notes (Signed)
Patient: Travis Salazar. Male    DOB: Jan 03, 1936   84 y.o.   MRN: [id number] Visit Date: 01/24/2020  Today's Provider: [name], MD   Chief Complaint  Patient presents with  . Follow-up  . Hypertension  . Hyperglycemia  . Hyperlipidemia   Subjective:     HPI  Patient has been diagnosed with likely lung cancer by incidental finding on CT scan.  He has declined biopsy and wedge resection offered by thoracic surgeon. Hyperglycemia From 09/21/2019-HgB A1c 5.5.  Home blood sugars averaging 140.   Essential (primary) hypertension From 09/21/2019-labs checked-Stable.  Home blood pressure averaging 145/84.   Cerebrovascular accident (CVA) due to embolism of right middle cerebral artery ([place]) From 09/21/2019-Almost completely resolved. Labs checked-Stable.  Hyperlipemia  From 09/21/2019-Lipitor 40mg . Labs checked-Stable.   No Known Allergies   Current Outpatient Medications:  .  allopurinol (ZYLOPRIM) 300 MG tablet, TAKE 1 TABLET BY MOUTH EVERY DAY (Patient taking differently: Take 300 mg by mouth as needed. ), Disp: 90 tablet, Rfl: 3 .  amLODipine (NORVASC) 10 MG tablet, TAKE 1 TABLET BY MOUTH EVERY DAY, Disp: 90 tablet, Rfl: 3 .  aspirin EC 325 MG EC tablet, Take 1 tablet (325 mg total) by mouth daily., Disp: 90 tablet, Rfl: 1 .  atorvastatin (LIPITOR) 40 MG tablet, Take 1 tablet (40 mg total) by mouth daily at 6 PM., Disp: 90 tablet, Rfl: 1 .  clopidogrel (PLAVIX) 75 MG tablet, Take 1 tablet (75 mg total) by mouth daily., Disp: 90 tablet, Rfl: 1 .  colchicine 0.6 MG tablet, Take 1 tablet (0.6 mg total) by mouth daily., Disp: 60 tablet, Rfl: 5 .  ketorolac (ACULAR) 0.5 % ophthalmic solution, Place 1 drop into both eyes daily., Disp: , Rfl:  .  lisinopril (ZESTRIL) 40 MG tablet, Take 1 tablet (40 mg total) by mouth daily., Disp: 90 tablet, Rfl: 3 .  SIMBRINZA 1-0.2 % SUSP, Place 1 drop into both eyes 3 (three) times daily., Disp: , Rfl:   Review of  Systems  Constitutional: Negative for appetite change, chills and fever.  Respiratory: Negative for chest tightness, shortness of breath and wheezing.   Cardiovascular: Negative for chest pain and palpitations.  Gastrointestinal: Negative for abdominal pain, nausea and vomiting.    Social History   Tobacco Use  . Smoking status: Former Smoker    Packs/day: 1.00    Years: 50.00    Pack years: 50.00    Quit date: 12/15/2009    Years since quitting: 10.1  . Smokeless tobacco: Never Used  Substance Use Topics  . Alcohol use: No    Alcohol/week: 0.0 standard drinks      Objective:   BP (!) 151/75 (BP Location: Right Arm, Patient Position: Sitting, Cuff Size: Large)   Pulse 82   Temp (!) 97.5 F (36.4 C) (Other (Comment))   Resp 18   Ht 5\' 9"  (1.753 m)   Wt 207 lb (93.9 kg)   SpO2 99%   BMI 30.57 kg/m  Vitals:   01/24/20 1057  BP: (!) 151/75  Pulse: 82  Resp: 18  Temp: (!) 97.5 F (36.4 C)  TempSrc: Other (Comment)  SpO2: 99%  Weight: 207 lb (93.9 kg)  Height: 5\' 9"  (1.753 m)  Body mass index is 30.57 kg/m.   Physical Exam Vitals reviewed.  Constitutional:      Appearance: He is well-developed.  HENT:     Head: Normocephalic and atraumatic.     Right  Ear: External ear normal.     Left Ear: External ear normal.     Nose: Nose normal.  Eyes:     General: No scleral icterus.    Conjunctiva/sclera: Conjunctivae normal.  Neck:     Thyroid: No thyromegaly.  Cardiovascular:     Rate and Rhythm: Normal rate and regular rhythm.     Heart sounds: Normal heart sounds.  Pulmonary:     Effort: Pulmonary effort is normal.     Breath sounds: Normal breath sounds.  Abdominal:     Palpations: Abdomen is soft.  Skin:    General: Skin is warm and dry.  Neurological:     General: No focal deficit present.     Mental Status: He is alert and oriented to person, place, and time.  Psychiatric:        Mood and Affect: Mood normal.        Behavior: Behavior normal.         Thought Content: Thought content normal.        Judgment: Judgment normal.      Results for orders placed or performed in visit on 01/24/20  POCT glycosylated hemoglobin (Hb A1C)  Result Value Ref Range   Hemoglobin A1C 6.0 (A) 4.0 - 5.6 %   Est. average glucose Bld gHb Est-mCnc 126        Assessment & Plan    1. Hyperglycemia A1c is 6.0 today - POCT glycosylated hemoglobin (Hb A1C)  2. Essential (primary) hypertension Blood pressure little high today.  Consider adding hydralazine.  3. Lung nodule I did recommend the patient to reconsider having a wedge biopsy of this.  If it is an adenocarcinoma the lung it is the one chance to be curative.More than 50% 30 minute visit spent in counseling or coordination of care   4. S/P prostatectomy   5. Cerebrovascular accident (CVA) due to embolism of right middle cerebral artery ([place]) All risk factors treated.    Follow up in 3-4 months.     I,Zackarie Chason,acting as a scribe for [name], MD.,have documented all relevant documentation on the behalf of [name], MD,as directed by  [name], MD while in the presence of [name], MD.      [name], MD  [facility]

## 2020-01-24 ENCOUNTER — Other Ambulatory Visit: Payer: Self-pay

## 2020-01-24 ENCOUNTER — Ambulatory Visit (INDEPENDENT_AMBULATORY_CARE_PROVIDER_SITE_OTHER): Payer: Medicare Other | Admitting: Family Medicine

## 2020-01-24 ENCOUNTER — Encounter: Payer: Self-pay | Admitting: Family Medicine

## 2020-01-24 VITALS — BP 151/75 | HR 82 | Temp 97.5°F | Resp 18 | Ht 69.0 in | Wt 207.0 lb

## 2020-01-24 DIAGNOSIS — R739 Hyperglycemia, unspecified: Secondary | ICD-10-CM

## 2020-01-24 DIAGNOSIS — Z9079 Acquired absence of other genital organ(s): Secondary | ICD-10-CM

## 2020-01-24 DIAGNOSIS — R911 Solitary pulmonary nodule: Secondary | ICD-10-CM

## 2020-01-24 DIAGNOSIS — I10 Essential (primary) hypertension: Secondary | ICD-10-CM | POA: Diagnosis not present

## 2020-01-24 DIAGNOSIS — I63411 Cerebral infarction due to embolism of right middle cerebral artery: Secondary | ICD-10-CM | POA: Diagnosis not present

## 2020-01-24 LAB — POCT GLYCOSYLATED HEMOGLOBIN (HGB A1C)
Est. average glucose Bld gHb Est-mCnc: 126
Hemoglobin A1C: 6 % — AB (ref 4.0–5.6)

## 2020-03-02 ENCOUNTER — Telehealth: Payer: Self-pay | Admitting: *Deleted

## 2020-03-02 NOTE — Telephone Encounter (Signed)
Oncology Nurse Navigator Documentation  Oncology Nurse Navigator Flowsheets 03/02/2020  Navigator Location [facility]  Navigator Encounter Type Telephone/I called Travis Salazar to follow up on his decision for follow up care.  He states "I will call Dr. Roxan Hockey if I want to".  He then hung the phone up.  I will update Dr. Leonarda Salon office.   Telephone Outgoing Call  [facility] Date -  Multidisiplinary Clinic Type -  Patient Visit Type -  Treatment Phase -  Barriers/Navigation Needs Coordination of Care;Education  Education Other  Interventions Coordination of Care;Education;Psycho-Social Support  Acuity Level 2-Minimal Needs (1-2 Barriers Identified)  Coordination of Care Other  Education Method Verbal  Time Spent with Patient 30

## 2020-03-29 ENCOUNTER — Other Ambulatory Visit: Payer: Self-pay | Admitting: Thoracic Surgery (Cardiothoracic Vascular Surgery)

## 2020-03-29 DIAGNOSIS — R911 Solitary pulmonary nodule: Secondary | ICD-10-CM

## 2020-04-12 NOTE — Progress Notes (Signed)
Subjective:   Travis Chura. is a 84 y.o. male who presents for Medicare Annual/Subsequent preventive examination.  Review of Systems:  N/A  Cardiac Risk Factors include: advanced age (>74mn, >>58women);dyslipidemia;male gender;hypertension     Objective:    Vitals: There were no vitals taken for this visit.  There is no height or weight on file to calculate BMI. Unable to obtain vitals due to visit being conducted via telephonically.   Advanced Directives 04/17/2020 06/26/2019 06/26/2019 06/26/2019 04/14/2019 11/25/2018 04/07/2018  Does Patient Have a Medical Advance Directive? Yes No No - Yes Yes Yes  Type of Advance Directive Living will - - - [place] Living will  Does patient want to make changes to medical advance directive? - - - - - No - Patient declined -  Copy of HApexin Chart? - - - - No - copy requested No - copy requested -  Would patient like information on creating a medical advance directive? - Yes (ED - Information included in AVS) Yes (ED - Information included in AVS) Yes (ED - Information included in AVS) - - -    Tobacco Social History   Tobacco Use  Smoking Status Former Smoker  . Packs/day: 1.00  . Years: 50.00  . Pack years: 50.00  . Quit date: 12/15/2009  . Years since quitting: 10.3  Smokeless Tobacco Never Used     Counseling given: Not Answered   Clinical Intake:  Pre-visit preparation completed: Yes  Pain : No/denies pain Pain Score: 0-No pain     Nutritional Risks: None Diabetes: No  How often do you need to have someone help you when you read instructions, pamphlets, or other written materials from your doctor or pharmacy?: 1 - Never  Interpreter Needed?: No  Information [facility]  Past Medical History:  Diagnosis Date  . Hyperlipidemia   . Hypertension    Past Surgical History:  Procedure Laterality Date  .  LIPOMA EXCISION     neck  . PROSTATECTOMY     due to prostate cancer   Family History  Problem Relation Age of Onset  . COPD Mother   . Heart attack Father    Social History   Socioeconomic History  . Marital status: Married    Spouse name: [name]  . Number of children: 4  . Years of education: 10th grade  . Highest education level: 9th grade  Occupational History  . Occupation: retired  Tobacco Use  . Smoking status: Former Smoker    Packs/day: 1.00    Years: 50.00    Pack years: 50.00    Quit date: 12/15/2009    Years since quitting: 10.3  . Smokeless tobacco: Never Used  Substance and Sexual Activity  . Alcohol use: No    Alcohol/week: 0.0 standard drinks  . Drug use: No  . Sexual activity: Not on file  Other Topics Concern  . Not on file  Social History Narrative  . Not on file   Social Determinants of Health   Financial Resource Strain: Low Risk   . Difficulty of Paying Living Expenses: Not hard at all  Food Insecurity: No Food Insecurity  . Worried About RCharity fundraiserin the Last Year: Never true  . Ran Out of Food in the Last Year: Never true  Transportation Needs: No Transportation Needs  . Lack of Transportation (Medical): No  . Lack of Transportation (Non-Medical):  No  Physical Activity: Inactive  . Days of Exercise per Week: 0 days  . Minutes of Exercise per Session: 0 min  Stress: No Stress Concern Present  . Feeling of Stress : Not at all  Social Connections: Slightly Isolated  . Frequency of Communication with Friends and Family: More than three times a week  . Frequency of Social Gatherings with Friends and Family: More than three times a week  . Attends Religious Services: More than 4 times per year  . Active Member of Clubs or Organizations: No  . Attends Archivist Meetings: Never  . Marital Status: Married    Outpatient Encounter Medications as of 04/17/2020  Medication Sig  . allopurinol (ZYLOPRIM) 300 MG tablet TAKE 1  TABLET BY MOUTH EVERY DAY (Patient taking differently: Take 300 mg by mouth as needed. )  . amLODipine (NORVASC) 10 MG tablet TAKE 1 TABLET BY MOUTH EVERY DAY  . aspirin EC 325 MG EC tablet Take 1 tablet (325 mg total) by mouth daily.  Marland Kitchen atorvastatin (LIPITOR) 40 MG tablet Take 1 tablet (40 mg total) by mouth daily at 6 PM.  . clopidogrel (PLAVIX) 75 MG tablet Take 1 tablet (75 mg total) by mouth daily.  . colchicine 0.6 MG tablet Take 1 tablet (0.6 mg total) by mouth daily.  Marland Kitchen ketorolac (ACULAR) 0.5 % ophthalmic solution Place 1 drop into both eyes daily.  Marland Kitchen lisinopril (ZESTRIL) 40 MG tablet Take 1 tablet (40 mg total) by mouth daily.  Marland Kitchen SIMBRINZA 1-0.2 % SUSP Place 1 drop into both eyes 3 (three) times daily.   No facility-administered encounter medications on file as of 04/17/2020.    Activities of Daily Living In your present state of health, do you have any difficulty performing the following activities: 04/17/2020 06/26/2019  Hearing? N N  Vision? N Y  Comment - wears glasses  Difficulty concentrating or making decisions? N N  Walking or climbing stairs? N N  Dressing or bathing? N N  Doing errands, shopping? N N  Preparing Food and eating ? N -  Using the Toilet? N -  In the past six months, have you accidently leaked urine? N -  Do you have problems with loss of bowel control? N -  Managing your Medications? N -  Managing your Finances? N -  Housekeeping or managing your Housekeeping? N -  Some recent data might be hidden    Patient Care Team: [name], MD as PCP - General (Family Medicine) Jerline Pain, MD as PCP - Cardiology (Cardiology) [facility], [place] as Consulting Physician (Optometry) Bernardo Heater, Ronda Fairly, MD as Consulting Physician (Urology)   Assessment:   This is a routine wellness examination for Travis Salazar.  Exercise Activities and Dietary recommendations Current Exercise Habits: Home exercise routine, Type of exercise: stretching;walking, Time  (Minutes): 15, Frequency (Times/Week): 3, Weekly Exercise (Minutes/Week): 45, Intensity: Mild, Exercise limited by: None identified  Goals      Patient Stated   . per wife "Travis Salazar is doing good from his stroke (pt-stated)     Current Barriers:  . Chronic Disease Management support and education needs related to recent MCA stroke/covid +  Nurse Case Manager Clinical Goal(s):  Marland Kitchen Over the next 60 days, patient will not experience hospital admission. Hospital Admissions in last 6 months = 1 . Over the next 30 days, patient will attend all scheduled medical appointments: 7/21 virtual visit with PCP, 8/19 hospital follow up with [facility]-goal met 08/05/2019 .  Over the next 60 days, patient will check BP 3 x week and record, calling Dr. Rosanna Randy with numbers outside discussed parameters   Interventions:  . Reviewed BP log . Discussed importance of continued Covid infection prevention strategies as patient was under the impression he was not able to contract again . Assessed for continued medication adherance . Discussed importance of low sodium diet to maintain BP WNL . Reviewed Neurology note from yesterdays appointment and discussed with patient  Patient Self Care Activities:  . Self administers medications as prescribed . Attends all scheduled provider appointments . Calls pharmacy for medication refills . Attends church or other social activities . Performs ADL's independently . Performs IADL's independently . Calls provider office for new concerns or questions  Please see past updates related to this goal by clicking on the "Past Updates" button in the selected goal        Other   . DIET - REDUCE SUGAR INTAKE     Recommend cutting back on daily desserts to a couple times a week.     . Exercise 3x per week (30 min per time)     Recommend to exercise for 3 days a week for at least 30 minutes at a time.        Fall Risk: Fall Risk  04/17/2020 04/14/2019 11/25/2018  04/07/2018 10/08/2016  Falls in the past year? 0 0 0 No No  Number falls in past yr: 0 - - - -  Injury with Fall? 0 - - - -    FALL RISK PREVENTION PERTAINING TO THE HOME:  Any stairs in or around the home? No  If so, are there any without handrails? N/A  Home free of loose throw rugs in walkways, pet beds, electrical cords, etc? Yes  Adequate lighting in your home to reduce risk of falls? Yes   ASSISTIVE DEVICES UTILIZED TO PREVENT FALLS:  Life alert? No  Use of a cane, walker or w/c? No  Grab bars in the bathroom? Yes  Shower chair or bench in shower? Yes  Elevated toilet seat or a handicapped toilet? Yes   TIMED UP AND GO:  Was the test performed? No .    Depression Screen PHQ 2/9 Scores 04/17/2020 08/04/2019 04/14/2019 04/07/2018  PHQ - 2 Score 0 0 0 0  PHQ- 9 Score - - 0 0    Cognitive Function     6CIT Screen 04/17/2020 04/14/2019  What Year? 0 points 0 points  What month? 0 points 0 points  What time? 0 points 0 points  Count back from 20 0 points 0 points  Months in reverse 2 points 0 points  Repeat phrase 6 points 4 points  Total Score 8 4    Immunization History  Administered Date(s) Administered  . Influenza-Unspecified 10/16/2018  . Pneumococcal Conjugate-13 10/04/2014  . Pneumococcal Polysaccharide-23 01/11/2013  . Zoster 11/13/2012    Qualifies for Shingles Vaccine? Yes  Zostavax completed 11/13/12. Due for Shingrix. Pt has been advised to call insurance company to determine out of pocket expense. Advised may also receive vaccine at local pharmacy or Health Dept. Verbalized acceptance and understanding.  Tdap: Although this vaccine is not a covered service during a Wellness Exam, does the patient still wish to receive this vaccine today?  No . Advised may receive this vaccine at local pharmacy or Health Dept. Aware to provide a copy of the vaccination record if obtained from local pharmacy or Health Dept. Verbalized acceptance and understanding.  Flu  Vaccine: Due fall 2021  Pneumococcal Vaccine: Completed series  Screening Tests Health Maintenance  Topic Date Due  . COVID-19 Vaccine (1) Never done  . TETANUS/TDAP  04/17/2021 (Originally 10/16/1955)  . INFLUENZA VACCINE  07/16/2020  . PNA vac Low Risk Adult  Completed   Cancer Screenings:  Colorectal Screening: No longer required.   Lung Cancer Screening: (Low Dose CT Chest recommended if Age 70-80 years, 30 pack-year currently smoking OR have quit w/in 15years.) does not qualify.   Additional Screening:  Vision Screening: Recommended annual ophthalmology exams for early detection of glaucoma and other disorders of the eye.  Dental Screening: Recommended annual dental exams for proper oral hygiene  Community Resource Referral:  CRR required this visit?  No        Plan:  I have personally reviewed and addressed the Medicare Annual Wellness questionnaire and have noted the following in the patient's chart:  A. Medical and social history B. Use of alcohol, tobacco or illicit drugs  C. Current medications and supplements D. Functional ability and status E.  Nutritional status F.  Physical activity G. Advance directives H. List of other physicians I.  Hospitalizations, surgeries, and ER visits in previous 12 months J.  [place] such as hearing and vision if needed, cognitive and depression L. Referrals and appointments   In addition, I have reviewed and discussed with patient certain preventive protocols, quality metrics, and best practice recommendations. A written personalized care plan for preventive services as well as general preventive health recommendations were provided to patient.   Glendora Score, [place]  05/20/5373 Nurse Health Advisor   Nurse Notes: None.

## 2020-04-14 ENCOUNTER — Ambulatory Visit: Payer: Medicare Other

## 2020-04-17 ENCOUNTER — Other Ambulatory Visit: Payer: Self-pay

## 2020-04-17 ENCOUNTER — Ambulatory Visit (INDEPENDENT_AMBULATORY_CARE_PROVIDER_SITE_OTHER): Payer: Medicare Other

## 2020-04-17 DIAGNOSIS — Z0000 Encounter for general adult medical examination without abnormal findings: Secondary | ICD-10-CM

## 2020-04-17 DIAGNOSIS — Z87891 Personal history of nicotine dependence: Secondary | ICD-10-CM

## 2020-04-17 NOTE — Patient Instructions (Signed)
[name] , Thank you for taking time to come for your Medicare Wellness Visit. I appreciate your ongoing commitment to your health goals. Please review the following plan we discussed and let me know if I can assist you in the future.   Screening recommendations/referrals: Colonoscopy: No longer required.  Recommended yearly ophthalmology/optometry visit for glaucoma screening and checkup Recommended yearly dental visit for hygiene and checkup  Vaccinations: Influenza vaccine: Due fall 2021 Pneumococcal vaccine: Completed series Tdap vaccine: Pt declines today.  Shingles vaccine: Pt declines today.     Advanced directives: Please bring a copy of your POA (Power of Attorney) and/or Living Will to your next appointment.   Conditions/risks identified: Recommend to increase exercise to 30 minutes, 3 times a week.  Next appointment: 04/25/20 @ 9:40 AM with Dr Rosanna Randy   Preventive Care 82 Years and Older, Male Preventive care refers to lifestyle choices and visits with your health care provider that can promote health and wellness. What does preventive care include?  A yearly physical exam. This is also called an annual well check.  Dental exams once or twice a year.  Routine eye exams. Ask your health care provider how often you should have your eyes checked.  Personal lifestyle choices, including:  Daily care of your teeth and gums.  Regular physical activity.  Eating a healthy diet.  Avoiding tobacco and drug use.  Limiting alcohol use.  Practicing safe sex.  Taking low doses of aspirin every day.  Taking vitamin and mineral supplements as recommended by your health care provider. What happens during an annual well check? The services and screenings done by your health care provider during your annual well check will depend on your age, overall health, lifestyle risk factors, and family history of disease. Counseling  Your health care provider may ask you questions  about your:  Alcohol use.  Tobacco use.  Drug use.  Emotional well-being.  Home and relationship well-being.  Sexual activity.  Eating habits.  History of falls.  Memory and ability to understand (cognition).  Work and work Statistician. Screening  You may have the following tests or measurements:  Height, weight, and BMI.  Blood pressure.  Lipid and cholesterol levels. These may be checked every 5 years, or more frequently if you are over 71 years old.  Skin check.  Lung cancer screening. You may have this screening every year starting at age 11 if you have a 30-pack-year history of smoking and currently smoke or have quit within the past 15 years.  Fecal occult blood test (FOBT) of the stool. You may have this test every year starting at age 38.  Flexible sigmoidoscopy or colonoscopy. You may have a sigmoidoscopy every 5 years or a colonoscopy every 10 years starting at age 76.  Prostate cancer screening. Recommendations will vary depending on your family history and other risks.  Hepatitis C blood test.  Hepatitis B blood test.  Sexually transmitted disease (STD) testing.  Diabetes screening. This is done by checking your blood sugar (glucose) after you have not eaten for a while (fasting). You may have this done every 1-3 years.  Abdominal aortic aneurysm (AAA) screening. You may need this if you are a current or former smoker.  Osteoporosis. You may be screened starting at age 92 if you are at high risk. Talk with your health care provider about your test results, treatment options, and if necessary, the need for more tests. Vaccines  Your health care provider may recommend certain vaccines,  such as:  Influenza vaccine. This is recommended every year.  Tetanus, diphtheria, and acellular pertussis (Tdap, Td) vaccine. You may need a Td booster every 10 years.  Zoster vaccine. You may need this after age 38.  Pneumococcal 13-valent conjugate (PCV13)  vaccine. One dose is recommended after age 29.  Pneumococcal polysaccharide (PPSV23) vaccine. One dose is recommended after age 37. Talk to your health care provider about which screenings and vaccines you need and how often you need them. This information is not intended to replace advice given to you by your health care provider. Make sure you discuss any questions you have with your health care provider. Document Released: 12/29/2015 Document Revised: 08/21/2016 Document Reviewed: 10/03/2015 Elsevier Interactive Patient Education  2017 [place] Prevention in the Home Falls can cause injuries. They can happen to people of all ages. There are many things you can do to make your home safe and to help prevent falls. What can I do on the outside of my home?  Regularly fix the edges of walkways and driveways and fix any cracks.  Remove anything that might make you trip as you walk through a door, such as a raised step or threshold.  Trim any bushes or trees on the path to your home.  Use bright outdoor lighting.  Clear any walking paths of anything that might make someone trip, such as rocks or tools.  Regularly check to see if handrails are loose or broken. Make sure that both sides of any steps have handrails.  Any raised decks and porches should have guardrails on the edges.  Have any leaves, snow, or ice cleared regularly.  Use sand or salt on walking paths during winter.  Clean up any spills in your garage right away. This includes oil or grease spills. What can I do in the bathroom?  Use night lights.  Install grab bars by the toilet and in the tub and shower. Do not use towel bars as grab bars.  Use non-skid mats or decals in the tub or shower.  If you need to sit down in the shower, use a plastic, non-slip stool.  Keep the floor dry. Clean up any water that spills on the floor as soon as it happens.  Remove soap buildup in the tub or shower  regularly.  Attach bath mats securely with double-sided non-slip rug tape.  Do not have throw rugs and other things on the floor that can make you trip. What can I do in the bedroom?  Use night lights.  Make sure that you have a light by your bed that is easy to reach.  Do not use any sheets or blankets that are too big for your bed. They should not hang down onto the floor.  Have a firm chair that has side arms. You can use this for support while you get dressed.  Do not have throw rugs and other things on the floor that can make you trip. What can I do in the kitchen?  Clean up any spills right away.  Avoid walking on wet floors.  Keep items that you use a lot in easy-to-reach places.  If you need to reach something above you, use a strong step stool that has a grab bar.  Keep electrical cords out of the way.  Do not use floor polish or wax that makes floors slippery. If you must use wax, use non-skid floor wax.  Do not have throw rugs and other things on  the floor that can make you trip. What can I do with my stairs?  Do not leave any items on the stairs.  Make sure that there are handrails on both sides of the stairs and use them. Fix handrails that are broken or loose. Make sure that handrails are as long as the stairways.  Check any carpeting to make sure that it is firmly attached to the stairs. Fix any carpet that is loose or worn.  Avoid having throw rugs at the top or bottom of the stairs. If you do have throw rugs, attach them to the floor with carpet tape.  Make sure that you have a light switch at the top of the stairs and the bottom of the stairs. If you do not have them, ask someone to add them for you. What else can I do to help prevent falls?  Wear shoes that:  Do not have high heels.  Have rubber bottoms.  Are comfortable and fit you well.  Are closed at the toe. Do not wear sandals.  If you use a stepladder:  Make sure that it is fully  opened. Do not climb a closed stepladder.  Make sure that both sides of the stepladder are locked into place.  Ask someone to hold it for you, if possible.  Clearly mark and make sure that you can see:  Any grab bars or handrails.  First and last steps.  Where the edge of each step is.  Use tools that help you move around (mobility aids) if they are needed. These include:  Canes.  Walkers.  Scooters.  Crutches.  Turn on the lights when you go into a dark area. Replace any light bulbs as soon as they burn out.  Set up your furniture so you have a clear path. Avoid moving your furniture around.  If any of your floors are uneven, fix them.  If there are any pets around you, be aware of where they are.  Review your medicines with your doctor. Some medicines can make you feel dizzy. This can increase your chance of falling. Ask your doctor what other things that you can do to help prevent falls. This information is not intended to replace advice given to you by your health care provider. Make sure you discuss any questions you have with your health care provider. Document Released: 09/28/2009 Document Revised: 05/09/2016 Document Reviewed: 01/06/2015 Elsevier Interactive Patient Education  2017 [employer].

## 2020-04-20 NOTE — Progress Notes (Signed)
Established patient visit  I,[name],acting as a scribe for [name], MD.,have documented all relevant documentation on the behalf of [name], MD,as directed by  [name], MD while in the presence of [name], MD.   Patient: Travis Salazar.   DOB: Aug 12, 1936   84 y.o. Male  MRN: [id number] Visit Date: 04/25/2020  Today's healthcare provider: [name], MD   Chief Complaint  Patient presents with  . Follow-up  . Hyperglycemia  . Hypertension   Subjective    HPI  Overall patient feeling fairly well.  No significant complaints. Hypertension, follow-up  BP Readings from Last 3 Encounters:  04/25/20 (!) 159/73  01/24/20 (!) 151/75  01/20/20 (!) 150/76   Wt Readings from Last 3 Encounters:  04/25/20 211 lb (95.7 kg)  01/24/20 207 lb (93.9 kg)  01/20/20 206 lb 8 oz (93.7 kg)     He was last seen for hypertension 2 months ago.  BP at that visit was 151/75. Management since that visit includes; Blood pressure little high today.  Consider adding hydralazine. He reports good compliance with treatment. He is not having side effects. none He is exercising. He is adherent to low salt diet.   Outside blood pressures are 130/80.  He does not smoke.  Use of agents associated with hypertension: none.   --------------------------------------------------------------------  Hyperglycemia From 01/24/2020-A1c is 6.0 today  Lung nodule From 01/24/2020-I did recommend the patient to reconsider having a wedge biopsy of this.  If it is an adenocarcinoma the lung it is the one chance to be curative.      Medications: Outpatient Medications Prior to Visit  Medication Sig  . allopurinol (ZYLOPRIM) 300 MG tablet TAKE 1 TABLET BY MOUTH EVERY DAY (Patient taking differently: Take 300 mg by mouth as needed. )  . amLODipine (NORVASC) 10 MG tablet TAKE 1 TABLET BY MOUTH EVERY DAY  . aspirin EC 325 MG EC tablet Take 1 tablet (325 mg  total) by mouth daily.  Marland Kitchen atorvastatin (LIPITOR) 40 MG tablet Take 1 tablet (40 mg total) by mouth daily at 6 PM.  . clopidogrel (PLAVIX) 75 MG tablet Take 1 tablet (75 mg total) by mouth daily.  . colchicine 0.6 MG tablet Take 1 tablet (0.6 mg total) by mouth daily.  Marland Kitchen ketorolac (ACULAR) 0.5 % ophthalmic solution Place 1 drop into both eyes daily.  Marland Kitchen lisinopril (ZESTRIL) 40 MG tablet Take 1 tablet (40 mg total) by mouth daily.  Marland Kitchen SIMBRINZA 1-0.2 % SUSP Place 1 drop into both eyes 3 (three) times daily.   No facility-administered medications prior to visit.    Review of Systems  Constitutional: Negative for appetite change, chills and fever.  Respiratory: Negative for chest tightness, shortness of breath and wheezing.   Cardiovascular: Negative for chest pain and palpitations.  Gastrointestinal: Negative for abdominal pain, nausea and vomiting.    Last hemoglobin A1c Lab Results  Component Value Date   HGBA1C 5.8 04/25/2020      Objective    BP (!) 159/73 (BP Location: Left Arm, Patient Position: Sitting, Cuff Size: Large)   Pulse 80   Temp (!) 96.9 F (36.1 C) (Other (Comment))   Resp 18   Ht 5\' 9"  (1.753 m)   Wt 211 lb (95.7 kg)   SpO2 93%   BMI 31.16 kg/m  BP Readings from Last 3 Encounters:  04/25/20 (!) 159/73  01/24/20 (!) 151/75  01/20/20 (!) 150/76   Wt Readings  from Last 3 Encounters:  04/25/20 211 lb (95.7 kg)  01/24/20 207 lb (93.9 kg)  01/20/20 206 lb 8 oz (93.7 kg)      Physical Exam    Results for orders placed or performed in visit on 04/25/20  POCT HgB A1C  Result Value Ref Range   HbA1c, POC (prediabetic range) 5.8 5.7 - 6.4 %   Est. average glucose Bld gHb Est-mCnc 120     Assessment & Plan     1. Blood glucose elevated A1c 5.8 today.  Good control. - POCT HgB A1C  2. Essential (primary) hypertension Recheck blood pressure 130/80. Controlled on amlodipine and lisinopril  3. Acute left hemiparesis (HCC) Back to almost completely  normal exam.  4. Paroxysmal supraventricular tachycardia ([place])   5. Cerebrovascular accident (CVA) due to embolism of right middle cerebral artery ([place]) All risk factors treated  6. Lung nodule CT this week for follow-up of possible lung cancer.   Return in 18 weeks (on 08/29/2020), or For CPE.      I, [name], MD, have reviewed all documentation for this visit. The documentation on 04/30/20 for the exam, diagnosis, procedures, and orders are all accurate and complete.    Tennis Mckinnon Cranford Mon, MD  [facility] [phone] (phone) [phone] (fax)  [place]

## 2020-04-25 ENCOUNTER — Other Ambulatory Visit: Payer: Self-pay

## 2020-04-25 ENCOUNTER — Encounter: Payer: Self-pay | Admitting: Family Medicine

## 2020-04-25 ENCOUNTER — Ambulatory Visit (INDEPENDENT_AMBULATORY_CARE_PROVIDER_SITE_OTHER): Payer: Medicare Other | Admitting: Family Medicine

## 2020-04-25 VITALS — BP 159/73 | HR 80 | Temp 96.9°F | Resp 18 | Ht 69.0 in | Wt 211.0 lb

## 2020-04-25 DIAGNOSIS — I63411 Cerebral infarction due to embolism of right middle cerebral artery: Secondary | ICD-10-CM | POA: Diagnosis not present

## 2020-04-25 DIAGNOSIS — I471 Supraventricular tachycardia: Secondary | ICD-10-CM

## 2020-04-25 DIAGNOSIS — R739 Hyperglycemia, unspecified: Secondary | ICD-10-CM | POA: Diagnosis not present

## 2020-04-25 DIAGNOSIS — R911 Solitary pulmonary nodule: Secondary | ICD-10-CM

## 2020-04-25 DIAGNOSIS — I10 Essential (primary) hypertension: Secondary | ICD-10-CM

## 2020-04-25 DIAGNOSIS — G8194 Hemiplegia, unspecified affecting left nondominant side: Secondary | ICD-10-CM

## 2020-04-25 LAB — POCT GLYCOSYLATED HEMOGLOBIN (HGB A1C)
Est. average glucose Bld gHb Est-mCnc: 120
HbA1c, POC (prediabetic range): 5.8 % (ref 5.7–6.4)

## 2020-04-30 DIAGNOSIS — I471 Supraventricular tachycardia, unspecified: Secondary | ICD-10-CM | POA: Insufficient documentation

## 2020-04-30 DIAGNOSIS — G8194 Hemiplegia, unspecified affecting left nondominant side: Secondary | ICD-10-CM | POA: Insufficient documentation

## 2020-05-02 ENCOUNTER — Other Ambulatory Visit: Payer: Medicare Other

## 2020-05-02 ENCOUNTER — Ambulatory Visit: Payer: Medicare Other | Admitting: Thoracic Surgery (Cardiothoracic Vascular Surgery)

## 2020-05-20 ENCOUNTER — Other Ambulatory Visit: Payer: Self-pay | Admitting: Family Medicine

## 2020-05-20 NOTE — Telephone Encounter (Signed)
Requested Prescriptions  Pending Prescriptions Disp Refills  . atorvastatin (LIPITOR) 40 MG tablet [Pharmacy Med Name: ATORVASTATIN 40 MG TABLET] 90 tablet 0    Sig: TAKE 1 TABLET (40 MG TOTAL) BY MOUTH DAILY AT 6 PM.     Cardiovascular:  Antilipid - Statins Failed - 05/20/2020  9:42 AM      Failed - HDL in normal range and within 360 days    HDL  Date Value Ref Range Status  08/04/2019 35 (L) >39 mg/dL Final         Passed - Total Cholesterol in normal range and within 360 days    Cholesterol, Total  Date Value Ref Range Status  08/04/2019 141 100 - 199 mg/dL Final         Passed - LDL in normal range and within 360 days    LDL Cholesterol (Calc)  Date Value Ref Range Status  10/06/2017 119 (H) mg/dL (calc) Final    Comment:    Reference range: <100 . Desirable range <100 mg/dL for primary prevention;   <70 mg/dL for patients with CHD or diabetic patients  with > or = 2 CHD risk factors. Marland Kitchen LDL-C is now calculated using the Martin-Hopkins  calculation, which is a validated novel method providing  better accuracy than the Friedewald equation in the  estimation of LDL-C.  Cresenciano Genre et al. Annamaria Helling. 7034;035(24): 2061-2068  (http://education.[facility].com/faq/FAQ164)    LDL Calculated  Date Value Ref Range Status  08/04/2019 87 0 - 99 mg/dL Final         Passed - Triglycerides in normal range and within 360 days    Triglycerides  Date Value Ref Range Status  08/04/2019 95 0 - 149 mg/dL Final         Passed - Patient is not pregnant      Passed - Valid encounter within last 12 months    Recent Outpatient Visits          3 weeks ago Blood glucose elevated   [facility] [name], MD   3 months ago Hyperglycemia   [facility] [name], MD   8 months ago Hyperglycemia   [facility] [name], MD   10 months ago Encounter for annual physical exam   [facility]  [name], MD   10 months ago Cerebrovascular accident (CVA) due to embolism of right middle cerebral artery [facility])   [facility] [name], MD

## 2020-06-06 ENCOUNTER — Ambulatory Visit: Payer: Medicare Other | Admitting: Adult Health

## 2020-06-16 ENCOUNTER — Other Ambulatory Visit: Payer: Self-pay | Admitting: Family Medicine

## 2020-06-16 DIAGNOSIS — M10031 Idiopathic gout, right wrist: Secondary | ICD-10-CM

## 2020-07-02 ENCOUNTER — Other Ambulatory Visit: Payer: Self-pay | Admitting: Family Medicine

## 2020-07-02 NOTE — Telephone Encounter (Signed)
Requested Prescriptions  Pending Prescriptions Disp Refills   clopidogrel (PLAVIX) 75 MG tablet [Pharmacy Med Name: CLOPIDOGREL 75 MG TABLET] 90 tablet 1    Sig: TAKE 1 TABLET BY MOUTH EVERY DAY     Hematology: Antiplatelets - clopidogrel Failed - 07/02/2020  9:08 AM      Failed - Evaluate AST, ALT within 2 months of therapy initiation.      Passed - ALT in normal range and within 360 days    ALT  Date Value Ref Range Status  01/07/2020 12 0 - 44 U/L Final         Passed - AST in normal range and within 360 days    AST  Date Value Ref Range Status  01/07/2020 19 15 - 41 U/L Final         Passed - HCT in normal range and within 180 days    HCT  Date Value Ref Range Status  01/07/2020 43.1 39 - 52 % Final   Hematocrit  Date Value Ref Range Status  09/21/2019 40.8 37.5 - 51.0 % Final         Passed - HGB in normal range and within 180 days    Hemoglobin  Date Value Ref Range Status  01/07/2020 13.9 13.0 - 17.0 g/dL Final  09/21/2019 13.2 13.0 - 17.7 g/dL Final         Passed - PLT in normal range and within 180 days    Platelets  Date Value Ref Range Status  09/21/2019 194 150 - 450 x10E3/uL Final   Platelet Count  Date Value Ref Range Status  01/07/2020 158 150 - 400 K/uL Final         Passed - Valid encounter within last 6 months    Recent Outpatient Visits          2 months ago Blood glucose elevated   [facility] [name], MD   5 months ago Hyperglycemia   [facility] [name], MD   9 months ago Hyperglycemia   [facility] [name], MD   11 months ago Encounter for annual physical exam   [facility] [name], MD   12 months ago Cerebrovascular accident (CVA) due to embolism of right middle cerebral artery [facility])   [facility] [name], MD      Future Appointments            In 1 month [name],  MD [facility], [place]

## 2020-07-11 ENCOUNTER — Encounter: Payer: Self-pay | Admitting: *Deleted

## 2020-07-11 ENCOUNTER — Telehealth: Payer: Self-pay | Admitting: Internal Medicine

## 2020-07-11 DIAGNOSIS — R911 Solitary pulmonary nodule: Secondary | ICD-10-CM

## 2020-07-11 NOTE — Telephone Encounter (Signed)
Scheduled appt per 7/27 schmsg - pt to call back for appt d/t

## 2020-07-11 NOTE — Progress Notes (Signed)
I followed up on Travis Salazar schedule. He is not set up for a follow up with Dr. Julien Nordmann. I completed scheduling message to call and schedule patient.

## 2020-07-24 ENCOUNTER — Inpatient Hospital Stay: Payer: Medicare Other

## 2020-07-24 ENCOUNTER — Inpatient Hospital Stay: Payer: Medicare Other | Attending: Internal Medicine | Admitting: Internal Medicine

## 2020-08-15 ENCOUNTER — Other Ambulatory Visit: Payer: Self-pay | Admitting: Family Medicine

## 2020-08-15 NOTE — Telephone Encounter (Signed)
Requested Prescriptions  Pending Prescriptions Disp Refills  . atorvastatin (LIPITOR) 40 MG tablet [Pharmacy Med Name: ATORVASTATIN 40 MG TABLET] 90 tablet 0    Sig: TAKE 1 TABLET (40 MG TOTAL) BY MOUTH DAILY AT 6 PM.     Cardiovascular:  Antilipid - Statins Failed - 08/15/2020  1:54 AM      Failed - Total Cholesterol in normal range and within 360 days    Cholesterol, Total  Date Value Ref Range Status  08/04/2019 141 100 - 199 mg/dL Final         Failed - LDL in normal range and within 360 days    LDL Cholesterol (Calc)  Date Value Ref Range Status  10/06/2017 119 (H) mg/dL (calc) Final    Comment:    Reference range: <100 . Desirable range <100 mg/dL for primary prevention;   <70 mg/dL for patients with CHD or diabetic patients  with > or = 2 CHD risk factors. Marland Kitchen LDL-C is now calculated using the Martin-Hopkins  calculation, which is a validated novel method providing  better accuracy than the Friedewald equation in the  estimation of LDL-C.  Cresenciano Genre et al. Annamaria Helling. 2202;542(70): 2061-2068  (http://education.[facility].com/faq/FAQ164)    LDL Calculated  Date Value Ref Range Status  08/04/2019 87 0 - 99 mg/dL Final         Failed - HDL in normal range and within 360 days    HDL  Date Value Ref Range Status  08/04/2019 35 (L) >39 mg/dL Final         Failed - Triglycerides in normal range and within 360 days    Triglycerides  Date Value Ref Range Status  08/04/2019 95 0 - 149 mg/dL Final         Passed - Patient is not pregnant      Passed - Valid encounter within last 12 months    Recent Outpatient Visits          3 months ago Blood glucose elevated   [facility] [name], MD   6 months ago Hyperglycemia   [facility] [name], MD   10 months ago Hyperglycemia   [facility] [name], MD   1 year ago Encounter for annual physical exam   [facility]  [name], MD   1 year ago Cerebrovascular accident (CVA) due to embolism of right middle cerebral artery [facility])   [facility] [name], MD      Future Appointments            In 2 weeks [name], MD [facility]           Patient needs lipid panel with next visit.

## 2020-08-24 ENCOUNTER — Ambulatory Visit: Payer: Medicare Other | Admitting: Adult Health

## 2020-08-28 NOTE — Progress Notes (Signed)
I,[name],acting as a scribe for [name], MD.,have documented all relevant documentation on the behalf of [name], MD,as directed by  [name], MD while in the presence of [name], MD.   Complete physical exam   Patient: Travis Salazar.   DOB: 12-19-1935   84 y.o. Male  MRN: [id number] Visit Date: 08/29/2020  Today's healthcare provider: [name], MD   Chief Complaint  Patient presents with  . Annual Exam   Subjective    Travis Salazar. is a 84 y.o. male who presents today for a complete physical exam.  He reports consuming a low sodium diet. Home exercise routine includes walking. He generally feels well. He reports sleeping well. He does not have additional problems to discuss today.  HPI  Patient had AWV with [facility] on 04/17/2020.  Past Medical History:  Diagnosis Date  . Hyperlipidemia   . Hypertension    Past Surgical History:  Procedure Laterality Date  . LIPOMA EXCISION     neck  . PROSTATECTOMY     due to prostate cancer   Social History   Socioeconomic History  . Marital status: Married    Spouse name: [name]  . Number of children: 4  . Years of education: 10th grade  . Highest education level: 9th grade  Occupational History  . Occupation: retired  Tobacco Use  . Smoking status: Former Smoker    Packs/day: 1.00    Years: 50.00    Pack years: 50.00    Quit date: 12/15/2009    Years since quitting: 10.7  . Smokeless tobacco: Never Used  Vaping Use  . Vaping Use: Never used  Substance and Sexual Activity  . Alcohol use: No    Alcohol/week: 0.0 standard drinks  . Drug use: No  . Sexual activity: Not on file  Other Topics Concern  . Not on file  Social History Narrative  . Not on file   Social Determinants of Health   Financial Resource Strain: Low Risk   . Difficulty of Paying Living Expenses: Not hard at all  Food Insecurity: No Food Insecurity  . Worried About Charity fundraiser in  the Last Year: Never true  . Ran Out of Food in the Last Year: Never true  Transportation Needs: No Transportation Needs  . Lack of Transportation (Medical): No  . Lack of Transportation (Non-Medical): No  Physical Activity: Inactive  . Days of Exercise per Week: 0 days  . Minutes of Exercise per Session: 0 min  Stress: No Stress Concern Present  . Feeling of Stress : Not at all  Social Connections: Moderately Integrated  . Frequency of Communication with Friends and Family: More than three times a week  . Frequency of Social Gatherings with Friends and Family: More than three times a week  . Attends Religious Services: More than 4 times per year  . Active Member of Clubs or Organizations: No  . Attends Archivist Meetings: Never  . Marital Status: Married  Human resources officer Violence: Not At Risk  . Fear of Current or Ex-Partner: No  . Emotionally Abused: No  . Physically Abused: No  . Sexually Abused: No   Family Status  Relation Name Status  . Mother  Deceased  . Father  Deceased at age 57  . Brother  Alive   Family History  Problem Relation Age of Onset  . COPD Mother   . Heart attack Father  No Known Allergies  Patient Care Team: [name], MD as PCP - General (Family Medicine) Jerline Pain, MD as PCP - Cardiology (Cardiology) [place], [place] as Consulting Physician (Optometry) Bernardo Heater, Ronda Fairly, MD as Consulting Physician (Urology)   Medications: Outpatient Medications Prior to Visit  Medication Sig  . allopurinol (ZYLOPRIM) 300 MG tablet TAKE 1 TABLET BY MOUTH EVERY DAY  . amLODipine (NORVASC) 10 MG tablet TAKE 1 TABLET BY MOUTH EVERY DAY  . aspirin EC 325 MG EC tablet Take 1 tablet (325 mg total) by mouth daily.  Marland Kitchen atorvastatin (LIPITOR) 40 MG tablet TAKE 1 TABLET (40 MG TOTAL) BY MOUTH DAILY AT 6 PM.  . clopidogrel (PLAVIX) 75 MG tablet TAKE 1 TABLET BY MOUTH EVERY DAY  . colchicine 0.6 MG tablet Take 1 tablet (0.6 mg total) by  mouth daily.  Marland Kitchen ketorolac (ACULAR) 0.5 % ophthalmic solution Place 1 drop into both eyes daily.  Marland Kitchen lisinopril (ZESTRIL) 40 MG tablet Take 1 tablet (40 mg total) by mouth daily.  Marland Kitchen SIMBRINZA 1-0.2 % SUSP Place 1 drop into both eyes 3 (three) times daily.   No facility-administered medications prior to visit.    Review of Systems  All other systems reviewed and are negative.     Objective    BP (!) 170/84 (BP Location: Left Arm, Cuff Size: Large)   Pulse 85   Temp 97.9 F (36.6 C) (Oral)   Resp 16   Ht '5\' 9"'  (1.753 m)   Wt 213 lb (96.6 kg)   SpO2 96%   BMI 31.45 kg/m    Physical Exam Vitals reviewed.  Constitutional:      Appearance: He is well-developed.  HENT:     Head: Normocephalic and atraumatic.     Right Ear: External ear normal.     Left Ear: External ear normal.     Nose: Nose normal.  Eyes:     General: No scleral icterus.    Pupils: Pupils are equal, round, and reactive to light.  Neck:     Thyroid: No thyromegaly.  Cardiovascular:     Rate and Rhythm: Normal rate and regular rhythm.     Heart sounds: Murmur heard.      Comments: 2/6 murmur Pulmonary:     Effort: Pulmonary effort is normal.     Breath sounds: Normal breath sounds.  Abdominal:     Palpations: Abdomen is soft.  Musculoskeletal:     Right lower leg: Edema present.     Left lower leg: Edema present.     Comments: 1+ on rt,trace on left.  Lymphadenopathy:     Cervical: No cervical adenopathy.  Skin:    General: Skin is warm and dry.  Neurological:     Mental Status: He is alert and oriented to person, place, and time.  Psychiatric:        Behavior: Behavior normal.        Thought Content: Thought content normal.        Judgment: Judgment normal.                                                                  Last depression screening scores PHQ 2/9 Scores 04/17/2020 08/04/2019 04/14/2019  PHQ - 2 Score  0 0 0  PHQ- 9 Score - - 0   Last fall risk  screening Fall Risk  04/17/2020  Falls in the past year? 0  Number falls in past yr: 0  Injury with Fall? 0   Last Audit-C alcohol use screening Alcohol Use Disorder Test (AUDIT) 04/17/2020  1. How often do you have a drink containing alcohol? 0  2. How many drinks containing alcohol do you have on a typical day when you are drinking? 0  3. How often do you have six or more drinks on one occasion? 0  AUDIT-C Score 0  Alcohol Brief Interventions/Follow-up AUDIT Score <7 follow-up not indicated   A score of 3 or more in women, and 4 or more in men indicates increased risk for alcohol abuse, EXCEPT if all of the points are from question 1   No results found for any visits on 08/29/20.  Assessment & Plan    Routine Health Maintenance and Physical Exam  Exercise Activities and Dietary recommendations Goals    .  DIET - REDUCE SUGAR INTAKE      Recommend cutting back on daily desserts to a couple times a week.     .  Exercise 3x per week (30 min per time)      Recommend to exercise for 3 days a week for at least 30 minutes at a time.     Marland Kitchen  per wife "Hung is doing good from his stroke (pt-stated)      Current Barriers:  . Chronic Disease Management support and education needs related to recent MCA stroke/covid +  Nurse Case Manager Clinical Goal(s):  Marland Kitchen Over the next 60 days, patient will not experience hospital admission. Hospital Admissions in last 6 months = 1 . Over the next 30 days, patient will attend all scheduled medical appointments: 7/21 virtual visit with PCP, 8/19 hospital follow up with [facility]-goal met 08/05/2019 . Over the next 60 days, patient will check BP 3 x week and record, calling Dr. Rosanna Randy with numbers outside discussed parameters   Interventions:  . Reviewed BP log . Discussed importance of continued Covid infection prevention strategies as patient was under the impression he was not able to contract again . Assessed for continued medication  adherance . Discussed importance of low sodium diet to maintain BP WNL . Reviewed Neurology note from yesterdays appointment and discussed with patient  Patient Self Care Activities:  . Self administers medications as prescribed . Attends all scheduled provider appointments . Calls pharmacy for medication refills . Attends church or other social activities . Performs ADL's independently . Performs IADL's independently . Calls provider office for new concerns or questions  Please see past updates related to this goal by clicking on the "Past Updates" button in the selected goal         Immunization History  Administered Date(s) Administered  . Influenza-Unspecified 10/16/2018  . Pneumococcal Conjugate-13 10/04/2014  . Pneumococcal Polysaccharide-23 01/11/2013  . Zoster 11/13/2012    Health Maintenance  Topic Date Due  . COVID-19 Vaccine (1) Never done  . TETANUS/TDAP  04/17/2021 (Originally 10/16/1955)  . INFLUENZA VACCINE  08/15/2021 (Originally 07/16/2020)  . PNA vac Low Risk Adult  Completed    Discussed health benefits of physical activity, and encouraged him to engage in regular exercise appropriate for his age and condition.  1. Annual physical exam   2. Essential (primary) hypertension Added HCTZ 25 mg bid. - CBC w/Diff/Platelet - Comprehensive Metabolic Panel (CMET) - hydrochlorothiazide (  HYDRODIURIL) 25 MG tablet; Take 1 tablet (25 mg total) by mouth 2 (two) times daily.  Dispense: 60 tablet; Refill: 12  3. Hyperglycemia  - Hemoglobin A1c  4. Hypercholesterolemia  - Lipid panel - TSH - CBC w/Diff/Platelet - Comprehensive Metabolic Panel (CMET)    Return in about 1 month (around 09/28/2020).        Kamica Florance Cranford Mon, MD  [facility] [phone] (phone) [phone] (fax)  [place]

## 2020-08-29 ENCOUNTER — Other Ambulatory Visit: Payer: Self-pay

## 2020-08-29 ENCOUNTER — Ambulatory Visit (INDEPENDENT_AMBULATORY_CARE_PROVIDER_SITE_OTHER): Payer: Medicare Other | Admitting: Family Medicine

## 2020-08-29 ENCOUNTER — Encounter: Payer: Self-pay | Admitting: Family Medicine

## 2020-08-29 VITALS — BP 170/84 | HR 85 | Temp 97.9°F | Resp 16 | Ht 69.0 in | Wt 213.0 lb

## 2020-08-29 DIAGNOSIS — Z0000 Encounter for general adult medical examination without abnormal findings: Secondary | ICD-10-CM

## 2020-08-29 DIAGNOSIS — R739 Hyperglycemia, unspecified: Secondary | ICD-10-CM | POA: Diagnosis not present

## 2020-08-29 DIAGNOSIS — E7800 Pure hypercholesterolemia, unspecified: Secondary | ICD-10-CM

## 2020-08-29 DIAGNOSIS — I10 Essential (primary) hypertension: Secondary | ICD-10-CM | POA: Diagnosis not present

## 2020-08-29 MED ORDER — HYDROCHLOROTHIAZIDE 25 MG PO TABS: 25.0000 mg | ORAL_TABLET | Freq: Every day | ORAL | 2 refills | Status: DC

## 2020-08-29 MED ORDER — HYDROCHLOROTHIAZIDE 25 MG PO TABS: 25.0000 mg | ORAL_TABLET | Freq: Two times a day (BID) | ORAL | 12 refills | Status: DC

## 2020-08-30 LAB — COMPREHENSIVE METABOLIC PANEL
ALT: 12 IU/L (ref 0–44)
AST: 21 IU/L (ref 0–40)
Albumin/Globulin Ratio: 1.8 (ref 1.2–2.2)
Albumin: 4.6 g/dL (ref 3.6–4.6)
Alkaline Phosphatase: 64 IU/L (ref 44–121)
BUN/Creatinine Ratio: 14 (ref 10–24)
BUN: 13 mg/dL (ref 8–27)
Bilirubin Total: 0.5 mg/dL (ref 0.0–1.2)
CO2: 24 mmol/L (ref 20–29)
Calcium: 10.5 mg/dL — ABNORMAL HIGH (ref 8.6–10.2)
Chloride: 101 mmol/L (ref 96–106)
Creatinine, Ser: 0.95 mg/dL (ref 0.76–1.27)
GFR calc Af Amer: 86 mL/min/{1.73_m2} (ref >59)
GFR calc non Af Amer: 75 mL/min/{1.73_m2} (ref >59)
Globulin, Total: 2.6 g/dL (ref 1.5–4.5)
Glucose: 101 mg/dL — ABNORMAL HIGH (ref 65–99)
Potassium: 5 mmol/L (ref 3.5–5.2)
Sodium: 139 mmol/L (ref 134–144)
Total Protein: 7.2 g/dL (ref 6.0–8.5)

## 2020-08-30 LAB — CBC WITH DIFFERENTIAL/PLATELET
Basophils Absolute: 0.1 10*3/uL (ref 0.0–0.2)
Basos: 1 %
EOS (ABSOLUTE): 0.2 10*3/uL (ref 0.0–0.4)
Eos: 3 %
Hematocrit: 43.3 % (ref 37.5–51.0)
Hemoglobin: 14.3 g/dL (ref 13.0–17.7)
Immature Grans (Abs): 0 10*3/uL (ref 0.0–0.1)
Immature Granulocytes: 0 %
Lymphocytes Absolute: 2.4 10*3/uL (ref 0.7–3.1)
Lymphs: 35 %
MCH: 29.3 pg (ref 26.6–33.0)
MCHC: 33 g/dL (ref 31.5–35.7)
MCV: 89 fL (ref 79–97)
Monocytes Absolute: 0.5 10*3/uL (ref 0.1–0.9)
Monocytes: 7 %
Neutrophils Absolute: 3.6 10*3/uL (ref 1.4–7.0)
Neutrophils: 54 %
Platelets: 176 10*3/uL (ref 150–450)
RBC: 4.88 x10E6/uL (ref 4.14–5.80)
RDW: 13.2 % (ref 11.6–15.4)
WBC: 6.7 10*3/uL (ref 3.4–10.8)

## 2020-08-30 LAB — LIPID PANEL
Chol/HDL Ratio: 4.7 ratio (ref 0.0–5.0)
Cholesterol, Total: 187 mg/dL (ref 100–199)
HDL: 40 mg/dL (ref >39)
LDL Chol Calc (NIH): 125 mg/dL — ABNORMAL HIGH (ref 0–99)
Triglycerides: 120 mg/dL (ref 0–149)
VLDL Cholesterol Cal: 22 mg/dL (ref 5–40)

## 2020-08-30 LAB — TSH: TSH: 4.04 u[IU]/mL (ref 0.450–4.500)

## 2020-08-30 LAB — HEMOGLOBIN A1C
Est. average glucose Bld gHb Est-mCnc: 123 mg/dL
Hgb A1c MFr Bld: 5.9 % — ABNORMAL HIGH (ref 4.8–5.6)

## 2020-09-03 MED ORDER — HYDRALAZINE HCL 25 MG PO TABS: 25.0000 mg | ORAL_TABLET | Freq: Two times a day (BID) | ORAL | 12 refills | Status: DC

## 2020-09-07 ENCOUNTER — Ambulatory Visit: Payer: Medicare Other | Admitting: Adult Health

## 2020-09-16 ENCOUNTER — Other Ambulatory Visit: Payer: Self-pay | Admitting: Family Medicine

## 2020-09-16 DIAGNOSIS — M10031 Idiopathic gout, right wrist: Secondary | ICD-10-CM

## 2020-09-16 NOTE — Telephone Encounter (Signed)
Requested Prescriptions  Pending Prescriptions Disp Refills  . allopurinol (ZYLOPRIM) 300 MG tablet [Pharmacy Med Name: ALLOPURINOL 300 MG TABLET] 90 tablet 0    Sig: TAKE 1 TABLET BY MOUTH EVERY DAY     Endocrinology:  Gout Agents Failed - 09/16/2020  9:17 AM      Failed - Uric Acid in normal range and within 360 days    Uric Acid, Serum  Date Value Ref Range Status  10/06/2017 4.5 4.0 - 8.0 mg/dL Final    Comment:    Therapeutic target for gout patients: <6.0 mg/dL .    Uric Acid  Date Value Ref Range Status  06/04/2017 4.9 3.7 - 8.6 mg/dL Final    Comment:               Therapeutic target for gout patients: <6.0         Passed - Cr in normal range and within 360 days    Creatinine  Date Value Ref Range Status  01/07/2020 0.81 0.61 - 1.24 mg/dL Final   Creat  Date Value Ref Range Status  10/06/2017 0.95 0.70 - 1.18 mg/dL Final    Comment:    For patients >54 years of age, the reference limit for Creatinine is approximately 13% higher for people identified as African-American. .    Creatinine, Ser  Date Value Ref Range Status  08/29/2020 0.95 0.76 - 1.27 mg/dL Final         Passed - Valid encounter within last 12 months    Recent Outpatient Visits          2 weeks ago Annual physical exam   [facility] [name], MD   4 months ago Blood glucose elevated   [facility] [name], MD   7 months ago Hyperglycemia   [facility] [name], MD   12 months ago Hyperglycemia   [facility] [name], MD   1 year ago Encounter for annual physical exam   [facility] [name], MD      Future Appointments            In 2 weeks [name], MD [facility], [place]

## 2020-10-02 ENCOUNTER — Other Ambulatory Visit: Payer: Self-pay | Admitting: Family Medicine

## 2020-10-02 DIAGNOSIS — I10 Essential (primary) hypertension: Secondary | ICD-10-CM

## 2020-10-02 NOTE — Progress Notes (Signed)
I,[name],acting as a scribe for [name], MD.,have documented all relevant documentation on the behalf of [name], MD,as directed by  [name], MD while in the presence of [name], MD.  Established patient visit   Patient: Travis Salazar.   DOB: 11-04-1936   84 y.o. Male  MRN: [id number] Visit Date: 10/03/2020  Today's healthcare provider: [name], MD   Chief Complaint  Patient presents with  . Follow-up  . Hypertension   Subjective    HPI  Hypertension, follow-up  BP Readings from Last 3 Encounters:  10/03/20 (!) 157/86  08/29/20 (!) 170/84  04/25/20 (!) 159/73   Wt Readings from Last 3 Encounters:  10/03/20 213 lb (96.6 kg)  08/29/20 213 lb (96.6 kg)  04/25/20 211 lb (95.7 kg)     He was last seen for hypertension 1 months ago.  BP at that visit was 170/84. Management since that visit includes; Added HCTZ 25 mg bid. He reports good compliance with treatment. He is not having side effects. none He is exercising. He is adherent to low salt diet.   Outside blood pressures are normal.  He does not smoke.  Use of agents associated with hypertension: none.   --------------------------------------------------------------------      Medications: Outpatient Medications Prior to Visit  Medication Sig  . allopurinol (ZYLOPRIM) 300 MG tablet TAKE 1 TABLET BY MOUTH EVERY DAY  . amLODipine (NORVASC) 10 MG tablet TAKE 1 TABLET BY MOUTH EVERY DAY  . aspirin EC 325 MG EC tablet Take 1 tablet (325 mg total) by mouth daily.  Marland Kitchen atorvastatin (LIPITOR) 40 MG tablet TAKE 1 TABLET (40 MG TOTAL) BY MOUTH DAILY AT 6 PM.  . clopidogrel (PLAVIX) 75 MG tablet TAKE 1 TABLET BY MOUTH EVERY DAY  . colchicine 0.6 MG tablet Take 1 tablet (0.6 mg total) by mouth daily.  . hydrALAZINE (APRESOLINE) 25 MG tablet Take 1 tablet (25 mg total) by mouth 2 (two) times daily.  Marland Kitchen ketorolac (ACULAR) 0.5 % ophthalmic solution Place 1 drop  into both eyes daily.  Marland Kitchen lisinopril (ZESTRIL) 40 MG tablet Take 1 tablet (40 mg total) by mouth daily.  Marland Kitchen SIMBRINZA 1-0.2 % SUSP Place 1 drop into both eyes 3 (three) times daily.  . hydrochlorothiazide (HYDRODIURIL) 25 MG tablet Take 1 tablet (25 mg total) by mouth 2 (two) times daily. (Patient not taking: Reported on 10/03/2020)   No facility-administered medications prior to visit.    Review of Systems  Constitutional: Negative for appetite change, chills and fever.  Respiratory: Negative for chest tightness, shortness of breath and wheezing.   Cardiovascular: Negative for chest pain and palpitations.  Gastrointestinal: Negative for abdominal pain, nausea and vomiting.       Objective    BP (!) 157/86 (BP Location: Left Arm, Cuff Size: Large)   Pulse 85   Temp 98.5 F (36.9 C) (Oral)   Resp 18   Ht 5\' 9"  (1.753 m)   Wt 213 lb (96.6 kg)   SpO2 100%   BMI 31.45 kg/m    Physical Exam Vitals reviewed.     BP (!) 157/86 (BP Location: Left Arm, Cuff Size: Large)   Pulse 85   Temp 98.5 F (36.9 C) (Oral)   Resp 18   Ht 5\' 9"  (1.753 m)   Wt 213 lb (96.6 kg)   SpO2 100%   BMI 31.45 kg/m   General Appearance:    Alert, cooperative, no distress,  appears stated age  Head:    Normocephalic, without obvious abnormality, atraumatic  Eyes:    PERRL, conjunctiva/corneas clear, EOM's intact, fundi    benign, both eyes       Ears:    Normal TM's and external ear canals, both ears  Nose:   Nares normal, septum midline, mucosa normal, no drainage   or sinus tenderness  Throat:   Lips, mucosa, and tongue normal; teeth and gums normal  Neck:   Supple, symmetrical, trachea midline, no adenopathy;       thyroid:  No enlargement/tenderness/nodules; no carotid   bruit or JVD  Back:     Symmetric, no curvature, ROM normal, no CVA tenderness  Lungs:     Clear to auscultation bilaterally, respirations unlabored  Chest wall:    No tenderness or deformity  Heart:    Regular rate and  rhythm, S1 and S2 normal, no murmur, rub   or gallop  Abdomen:     Soft, non-tender, bowel sounds active all four quadrants,    no masses, no organomegaly  Genitalia:    Normal male without lesion, discharge or tenderness  Rectal:    Normal tone, normal prostate, no masses or tenderness;   guaiac negative stool  Extremities:   Extremities normal, atraumatic, no cyanosis or edema  Pulses:   2+ and symmetric all extremities  Skin:   Skin color, texture, turgor normal, no rashes or lesions  Lymph nodes:   Cervical, supraclavicular, and axillary nodes normal  Neurologic:   CNII-XII intact. Normal strength, sensation and reflexes      throughout     No results found for any visits on 10/03/20.  Assessment & Plan     1. Essential (primary) hypertension Restart hydrochlorothiazide daily with other medication  2. Cerebrovascular accident (CVA) due to embolism of right middle cerebral artery (HCC) Risk factors treated  3. Acute left hemiparesis ([place]) Almost completely resolved.  4. Lung nodule Follow clinically, also followed by oncology/Thoracic surgery  5. Hypercholesterolemia    Return in about 2 months (around 12/03/2020).          Devinn Hurwitz Cranford Mon, MD  [facility] [phone] (phone) [phone] (fax)  [place]

## 2020-10-03 ENCOUNTER — Ambulatory Visit (INDEPENDENT_AMBULATORY_CARE_PROVIDER_SITE_OTHER): Payer: Medicare Other | Admitting: Family Medicine

## 2020-10-03 ENCOUNTER — Encounter: Payer: Self-pay | Admitting: Family Medicine

## 2020-10-03 ENCOUNTER — Other Ambulatory Visit: Payer: Self-pay

## 2020-10-03 VITALS — BP 157/86 | HR 85 | Temp 98.5°F | Resp 18 | Ht 69.0 in | Wt 213.0 lb

## 2020-10-03 DIAGNOSIS — R911 Solitary pulmonary nodule: Secondary | ICD-10-CM | POA: Diagnosis not present

## 2020-10-03 DIAGNOSIS — I63411 Cerebral infarction due to embolism of right middle cerebral artery: Secondary | ICD-10-CM

## 2020-10-03 DIAGNOSIS — I10 Essential (primary) hypertension: Secondary | ICD-10-CM

## 2020-10-03 DIAGNOSIS — E7800 Pure hypercholesterolemia, unspecified: Secondary | ICD-10-CM

## 2020-10-03 DIAGNOSIS — G8194 Hemiplegia, unspecified affecting left nondominant side: Secondary | ICD-10-CM | POA: Diagnosis not present

## 2020-10-03 NOTE — Patient Instructions (Addendum)
Restart hydrochlorothiazide daily with other medications.

## 2020-10-18 ENCOUNTER — Other Ambulatory Visit: Payer: Self-pay

## 2020-10-18 ENCOUNTER — Ambulatory Visit (INDEPENDENT_AMBULATORY_CARE_PROVIDER_SITE_OTHER): Payer: Medicare Other | Admitting: Family Medicine

## 2020-10-18 ENCOUNTER — Encounter: Payer: Self-pay | Admitting: Family Medicine

## 2020-10-18 VITALS — BP 154/87 | HR 89 | Temp 97.9°F | Resp 16 | Ht 69.0 in | Wt 214.0 lb

## 2020-10-18 DIAGNOSIS — I10 Essential (primary) hypertension: Secondary | ICD-10-CM | POA: Diagnosis not present

## 2020-10-18 DIAGNOSIS — I63411 Cerebral infarction due to embolism of right middle cerebral artery: Secondary | ICD-10-CM | POA: Diagnosis not present

## 2020-10-18 DIAGNOSIS — R911 Solitary pulmonary nodule: Secondary | ICD-10-CM

## 2020-10-18 NOTE — Progress Notes (Signed)
I,Travis Salazar,acting as a scribe for Travis Durie, MD.,have documented all relevant documentation on the behalf of Travis Durie, MD,as directed by  Travis Durie, MD while in the presence of Travis Durie, MD.   Established patient visit   Patient: Travis Salazar.   DOB: 1936-05-05   84 y.o. Male  MRN: [id number] Visit Date: 10/18/2020  Today's healthcare provider: [name], MD   Chief Complaint  Patient presents with  . Follow-up  . Hypertension   Subjective    HPI  Overall patient feels well.  He has no complaints today. Hypertension, follow-up  BP Readings from Last 3 Encounters:  10/18/20 (!) 154/87  10/03/20 (!) 157/86  08/29/20 (!) 170/84   Wt Readings from Last 3 Encounters:  10/18/20 214 lb (97.1 kg)  10/03/20 213 lb (96.6 kg)  08/29/20 213 lb (96.6 kg)     He was last seen for hypertension 3 weeks ago.  BP at that visit was 157/86. Management since that visit includes; Restarted hydrochlorothiazide daily with other medication. He reports good compliance with treatment. He is not having side effects. none He is exercising. He is adherent to low salt diet.   Outside blood pressures are not checking.  He does not smoke.  Use of agents associated with hypertension: none.   -------------------------------------------------------------------- Patient states he is doing well with medication changes.       Medications: Outpatient Medications Prior to Visit  Medication Sig  . allopurinol (ZYLOPRIM) 300 MG tablet TAKE 1 TABLET BY MOUTH EVERY DAY  . amLODipine (NORVASC) 10 MG tablet TAKE 1 TABLET BY MOUTH EVERY DAY  . aspirin EC 325 MG EC tablet Take 1 tablet (325 mg total) by mouth daily.  Marland Kitchen atorvastatin (LIPITOR) 40 MG tablet TAKE 1 TABLET (40 MG TOTAL) BY MOUTH DAILY AT 6 PM.  . clopidogrel (PLAVIX) 75 MG tablet TAKE 1 TABLET BY MOUTH EVERY DAY  . colchicine 0.6 MG tablet Take 1 tablet (0.6 mg total) by mouth daily.  .  hydrALAZINE (APRESOLINE) 25 MG tablet Take 1 tablet (25 mg total) by mouth 2 (two) times daily.  . hydrochlorothiazide (HYDRODIURIL) 25 MG tablet Take 1 tablet (25 mg total) by mouth 2 (two) times daily.  Marland Kitchen ketorolac (ACULAR) 0.5 % ophthalmic solution Place 1 drop into both eyes daily.  Marland Kitchen lisinopril (ZESTRIL) 40 MG tablet Take 1 tablet (40 mg total) by mouth daily.  Marland Kitchen SIMBRINZA 1-0.2 % SUSP Place 1 drop into both eyes 3 (three) times daily.   No facility-administered medications prior to visit.    Review of Systems  Constitutional: Negative for appetite change, chills and fever.  Respiratory: Negative for chest tightness, shortness of breath and wheezing.   Cardiovascular: Negative for chest pain and palpitations.  Gastrointestinal: Negative for abdominal pain, nausea and vomiting.       Objective    BP (!) 154/87 (BP Location: Right Arm, Patient Position: Sitting, Cuff Size: Large)   Pulse 89   Temp 97.9 F (36.6 C) (Oral)   Resp 16   Ht 5\' 9"  (1.753 m)   Wt 214 lb (97.1 kg)   SpO2 98%   BMI 31.60 kg/m  BP Readings from Last 3 Encounters:  10/18/20 (!) 154/87  10/03/20 (!) 157/86  08/29/20 (!) 170/84   Wt Readings from Last 3 Encounters:  10/18/20 214 lb (97.1 kg)  10/03/20 213 lb (96.6 kg)  08/29/20 213 lb (96.6 kg)      Physical Exam  Vitals reviewed.  Constitutional:      Appearance: He is well-developed.  HENT:     Head: Normocephalic and atraumatic.     Right Ear: External ear normal.     Left Ear: External ear normal.     Nose: Nose normal.  Eyes:     General: No scleral icterus.    Conjunctiva/sclera: Conjunctivae normal.  Neck:     Thyroid: No thyromegaly.  Cardiovascular:     Rate and Rhythm: Normal rate and regular rhythm.     Heart sounds: Normal heart sounds.  Pulmonary:     Effort: Pulmonary effort is normal.     Breath sounds: Normal breath sounds.  Abdominal:     Palpations: Abdomen is soft.  Skin:    General: Skin is warm and dry.   Neurological:     General: No focal deficit present.     Mental Status: He is alert and oriented to person, place, and time.  Psychiatric:        Mood and Affect: Mood normal.        Behavior: Behavior normal.        Thought Content: Thought content normal.        Judgment: Judgment normal.       No results found for any visits on 10/18/20.  Assessment & Plan     1. Essential (primary) hypertension Fair control.  Would like systolic around 356 or less.  Return to clinic 2 months Would increase hydralazine to 50 mg twice a day. 2. Lung nodule Followed by oncology  3. H/o Cerebrovascular accident (CVA) due to embolism of right middle cerebral artery ([place]) All risk factors treated   No follow-ups on file.      I, Travis Durie, MD, have reviewed all documentation for this visit. The documentation on 10/24/20 for the exam, diagnosis, procedures, and orders are all accurate and complete.    Kathrene Sinopoli Cranford Mon, MD  [facility] [phone] (phone) [phone] (fax)  [place]

## 2020-11-11 ENCOUNTER — Other Ambulatory Visit: Payer: Self-pay | Admitting: Family Medicine

## 2020-11-11 NOTE — Telephone Encounter (Signed)
Requested Prescriptions  Pending Prescriptions Disp Refills  . atorvastatin (LIPITOR) 40 MG tablet [Pharmacy Med Name: ATORVASTATIN 40 MG TABLET] 90 tablet 3    Sig: TAKE 1 TABLET (40 MG TOTAL) BY MOUTH DAILY AT 6 PM.     Cardiovascular:  Antilipid - Statins Failed - 11/11/2020  8:51 AM      Failed - LDL in normal range and within 360 days    LDL Cholesterol (Calc)  Date Value Ref Range Status  10/06/2017 119 (H) mg/dL (calc) Final    Comment:    Reference range: <100 . Desirable range <100 mg/dL for primary prevention;   <70 mg/dL for patients with CHD or diabetic patients  with > or = 2 CHD risk factors. Marland Kitchen LDL-C is now calculated using the Martin-Hopkins  calculation, which is a validated novel method providing  better accuracy than the Friedewald equation in the  estimation of LDL-C.  Cresenciano Genre et al. Annamaria Helling. 5400;867(61): 2061-2068  (http://education.[employer].com/faq/FAQ164)    LDL Chol Calc (NIH)  Date Value Ref Range Status  08/29/2020 125 (H) 0 - 99 mg/dL Final         Passed - Total Cholesterol in normal range and within 360 days    Cholesterol, Total  Date Value Ref Range Status  08/29/2020 187 100 - 199 mg/dL Final         Passed - HDL in normal range and within 360 days    HDL  Date Value Ref Range Status  08/29/2020 40 >39 mg/dL Final         Passed - Triglycerides in normal range and within 360 days    Triglycerides  Date Value Ref Range Status  08/29/2020 120 0 - 149 mg/dL Final         Passed - Patient is not pregnant      Passed - Valid encounter within last 12 months    Recent Outpatient Visits          3 weeks ago Essential (primary) hypertension   [place] [name], MD   1 month ago Essential (primary) hypertension   [facility] [name], MD   2 months ago Annual physical exam   [facility] [name], MD   6 months ago Blood glucose elevated    [facility] [name], MD   9 months ago Hyperglycemia   [facility] [name], MD      Future Appointments            In 1 month [name], MD [facility], [place]

## 2020-11-20 ENCOUNTER — Other Ambulatory Visit: Payer: Self-pay | Admitting: Family Medicine

## 2020-11-20 DIAGNOSIS — I10 Essential (primary) hypertension: Secondary | ICD-10-CM

## 2020-12-12 NOTE — Progress Notes (Signed)
Established patient visit   Patient: Travis Salazar.   DOB: 26-Jun-1936   84 y.o. Male  MRN: [id number] Visit Date: 12/18/2020  Today's healthcare provider: [name], MD   Chief Complaint  Patient presents with  . Hypertension   Subjective    HPI  Patient feels well and has no complaints. He is interested in getting COVID vaccination.  He has not been vaccinated to this point.  When he had a stroke about a year ago and in the hospital he had was Covid positive but was asymptomatic.  His wife was at home with Covid during this time. Hypertension, follow-up  BP Readings from Last 3 Encounters:  12/18/20 (!) 174/84  10/18/20 (!) 154/87  10/03/20 (!) 157/86   Wt Readings from Last 3 Encounters:  12/18/20 214 lb (97.1 kg)  10/18/20 214 lb (97.1 kg)  10/03/20 213 lb (96.6 kg)     He was last seen for hypertension 2 months ago.  BP at that visit was 154/87.      Management since that visit includes; Fair control.  Would like systolic around [id number] or less.  Return to clinic 2 months Would increase hydralazine to 50 mg twice a day. He reports excellent compliance with treatment. He is having side effects.  He is exercising. He is adherent to low salt diet.   Outside blood pressures are not being checked at home.  He does not smoke.  Use of agents associated with hypertension: none.   ---------------------------------------------------------------------------------------------------   Patient Active Problem List   Diagnosis Date Noted  . Acute left hemiparesis ([place]) 04/30/2020  . Paroxysmal supraventricular tachycardia ([place]) 04/30/2020  . Lung nodule 01/07/2020  . CVA (cerebral vascular accident) ([place]) 06/26/2019  . Wrist swelling, right 06/03/2017  . S/P prostatectomy 10/08/2016  . Snores 10/08/2016  . Essential (primary) hypertension 09/27/2015  . History of tobacco use 09/27/2015  . Hypercholesterolemia 09/27/2015  . Blood glucose elevated 09/27/2015   . Adiposity 09/27/2015   Past Medical History:  Diagnosis Date  . Hyperlipidemia   . Hypertension    Social History   Tobacco Use  . Smoking status: Former Smoker    Packs/day: 1.00    Years: 50.00    Pack years: 50.00    Quit date: 12/15/2009    Years since quitting: 11.0  . Smokeless tobacco: Never Used  Vaping Use  . Vaping Use: Never used  Substance Use Topics  . Alcohol use: No    Alcohol/week: 0.0 standard drinks  . Drug use: No       Medications: Outpatient Medications Prior to Visit  Medication Sig  . allopurinol (ZYLOPRIM) 300 MG tablet TAKE 1 TABLET BY MOUTH EVERY DAY  . amLODipine (NORVASC) 10 MG tablet TAKE 1 TABLET BY MOUTH EVERY DAY  . aspirin EC 325 MG EC tablet Take 1 tablet (325 mg total) by mouth daily.  Marland Kitchen atorvastatin (LIPITOR) 40 MG tablet TAKE 1 TABLET (40 MG TOTAL) BY MOUTH DAILY AT 6 PM.  . clopidogrel (PLAVIX) 75 MG tablet TAKE 1 TABLET BY MOUTH EVERY DAY  . colchicine 0.6 MG tablet Take 1 tablet (0.6 mg total) by mouth daily.  . hydrALAZINE (APRESOLINE) 25 MG tablet Take 1 tablet (25 mg total) by mouth 2 (two) times daily.  . hydrochlorothiazide (HYDRODIURIL) 25 MG tablet Take 1 tablet (25 mg total) by mouth 2 (two) times daily.  Marland Kitchen ketorolac (ACULAR) 0.5 % ophthalmic solution Place 1 drop into both eyes daily.  Marland Kitchen  lisinopril (ZESTRIL) 40 MG tablet Take 1 tablet (40 mg total) by mouth daily.  Marland Kitchen SIMBRINZA 1-0.2 % SUSP Place 1 drop into both eyes 3 (three) times daily.   No facility-administered medications prior to visit.    Review of Systems  Constitutional: Negative.  Negative for appetite change, chills and fever.  Respiratory: Negative.  Negative for chest tightness, shortness of breath and wheezing.   Cardiovascular: Negative.  Negative for chest pain and palpitations.  Gastrointestinal: Negative.  Negative for abdominal pain, nausea and vomiting.  Neurological: Negative for dizziness, light-headedness and headaches.      Objective     BP (!) 174/84 (BP Location: Left Arm, Patient Position: Sitting, Cuff Size: Large)   Pulse 81   Temp 98.3 F (36.8 C) (Oral)   Wt 214 lb (97.1 kg)   BMI 31.60 kg/m    Physical Exam Vitals reviewed.  Constitutional:      Appearance: He is well-developed.  HENT:     Head: Normocephalic and atraumatic.     Right Ear: External ear normal.     Left Ear: External ear normal.     Nose: Nose normal.  Eyes:     General: No scleral icterus.    Conjunctiva/sclera: Conjunctivae normal.  Neck:     Thyroid: No thyromegaly.  Cardiovascular:     Rate and Rhythm: Normal rate and regular rhythm.     Heart sounds: Normal heart sounds.  Pulmonary:     Effort: Pulmonary effort is normal.     Breath sounds: Normal breath sounds.  Abdominal:     Palpations: Abdomen is soft.  Skin:    General: Skin is warm and dry.  Neurological:     General: No focal deficit present.     Mental Status: He is alert and oriented to person, place, and time.  Psychiatric:        Mood and Affect: Mood normal.        Behavior: Behavior normal.        Thought Content: Thought content normal.        Judgment: Judgment normal.       No results found for any visits on 12/18/20.  Assessment & Plan     1. Need for COVID-19 vaccine Repeat in 2 months. - Pfizer SARS-COV-2 Vaccine  2. Essential (primary) hypertension Double hydralazine from 25 to 50 mg twice daily.  Follow-up 2 months.  3. Cerebrovascular accident (CVA) due to embolism of right middle cerebral artery (HCC) All risk factors treated.  4. Blood glucose elevated Follow-up A1c when appropriate.  Otherwise routine labs to follow renal function also.   No follow-ups on file.      I, Megan Mans, MD, have reviewed all documentation for this visit. The documentation on 12/19/20 for the exam, diagnosis, procedures, and orders are all accurate and complete.    Yahel Fuston Wendelyn Breslow, MD  [facility] [phone]  (phone) [phone] (fax)  [facility]

## 2020-12-15 ENCOUNTER — Other Ambulatory Visit: Payer: Self-pay | Admitting: Family Medicine

## 2020-12-15 DIAGNOSIS — M10031 Idiopathic gout, right wrist: Secondary | ICD-10-CM

## 2020-12-18 ENCOUNTER — Ambulatory Visit (INDEPENDENT_AMBULATORY_CARE_PROVIDER_SITE_OTHER): Payer: Medicare Other | Admitting: Family Medicine

## 2020-12-18 ENCOUNTER — Other Ambulatory Visit: Payer: Self-pay

## 2020-12-18 ENCOUNTER — Encounter: Payer: Self-pay | Admitting: Family Medicine

## 2020-12-18 VITALS — BP 174/84 | HR 81 | Temp 98.3°F | Wt 214.0 lb

## 2020-12-18 DIAGNOSIS — R739 Hyperglycemia, unspecified: Secondary | ICD-10-CM | POA: Diagnosis not present

## 2020-12-18 DIAGNOSIS — I10 Essential (primary) hypertension: Secondary | ICD-10-CM

## 2020-12-18 DIAGNOSIS — Z23 Encounter for immunization: Secondary | ICD-10-CM | POA: Diagnosis not present

## 2020-12-18 DIAGNOSIS — I63411 Cerebral infarction due to embolism of right middle cerebral artery: Secondary | ICD-10-CM

## 2020-12-18 MED ORDER — HYDRALAZINE HCL 50 MG PO TABS: 50.0000 mg | ORAL_TABLET | Freq: Two times a day (BID) | ORAL | 1 refills | Status: DC

## 2021-01-06 ENCOUNTER — Other Ambulatory Visit: Payer: Self-pay | Admitting: Family Medicine

## 2021-01-06 DIAGNOSIS — M10031 Idiopathic gout, right wrist: Secondary | ICD-10-CM

## 2021-01-07 ENCOUNTER — Other Ambulatory Visit: Payer: Self-pay | Admitting: Family Medicine

## 2021-02-16 NOTE — Progress Notes (Signed)
Established patient visit   Patient: Travis Salazar.   DOB: 1936/11/15   85 y.o. Male  MRN: [id number] Visit Date: 02/19/2021  Today's healthcare provider: [name], MD   Chief Complaint  Patient presents with  . Hypertension   Subjective    HPI  Patient comes in today for follow-up.  He has no complaints.  He states that his last blood pressure at home he thinks was reading 150/84.  He is not sure of this. Patient has had 2 Covid vaccines but has not had his booster. Hypertension, follow-up  BP Readings from Last 3 Encounters:  02/19/21 (!) 169/90  12/18/20 (!) 174/84  10/18/20 (!) 154/87   Wt Readings from Last 3 Encounters:  02/19/21 210 lb (95.3 kg)  12/18/20 214 lb (97.1 kg)  10/18/20 214 lb (97.1 kg)     He was last seen for hypertension 2 months ago.  BP at that visit was 174/84. Management since that visit includes; Double hydralazine from 25 to 50 mg twice daily.  Follow-up 2 months. He reports good compliance with treatment. He is not having side effects.  He is not exercising, but he does stay active.  He is adherent to low salt diet.   Outside blood pressures are checked occasionally.  He does not smoke.  Use of agents associated with hypertension: none.       Medications: Outpatient Medications Prior to Visit  Medication Sig  . allopurinol (ZYLOPRIM) 300 MG tablet TAKE 1 TABLET BY MOUTH EVERY DAY  . amLODipine (NORVASC) 10 MG tablet TAKE 1 TABLET BY MOUTH EVERY DAY  . aspirin EC 325 MG EC tablet Take 1 tablet (325 mg total) by mouth daily.  Marland Kitchen atorvastatin (LIPITOR) 40 MG tablet TAKE 1 TABLET (40 MG TOTAL) BY MOUTH DAILY AT 6 PM.  . clopidogrel (PLAVIX) 75 MG tablet TAKE 1 TABLET BY MOUTH EVERY DAY  . colchicine 0.6 MG tablet Take 1 tablet (0.6 mg total) by mouth daily.  . hydrALAZINE (APRESOLINE) 50 MG tablet Take 1 tablet (50 mg total) by mouth 2 (two) times daily.  . hydrochlorothiazide (HYDRODIURIL) 25 MG tablet Take 1 tablet (25  mg total) by mouth 2 (two) times daily.  Marland Kitchen ketorolac (ACULAR) 0.5 % ophthalmic solution Place 1 drop into both eyes daily.  Marland Kitchen lisinopril (ZESTRIL) 40 MG tablet Take 1 tablet (40 mg total) by mouth daily.  Marland Kitchen SIMBRINZA 1-0.2 % SUSP Place 1 drop into both eyes 3 (three) times daily.   No facility-administered medications prior to visit.    Review of Systems  Constitutional: Negative for appetite change, chills and fever.  Respiratory: Negative for chest tightness, shortness of breath and wheezing.   Cardiovascular: Negative for chest pain and palpitations.  Gastrointestinal: Negative for abdominal pain, nausea and vomiting.        Objective    BP (!) 169/90   Pulse 79   Temp 98.4 F (36.9 C)   Resp 16   Ht 5\' 8"  (1.727 m)   Wt 210 lb (95.3 kg)   BMI 31.93 kg/m  BP Readings from Last 3 Encounters:  02/19/21 (!) 169/90  12/18/20 (!) 174/84  10/18/20 (!) 154/87   Wt Readings from Last 3 Encounters:  02/19/21 210 lb (95.3 kg)  12/18/20 214 lb (97.1 kg)  10/18/20 214 lb (97.1 kg)       Physical Exam Vitals reviewed.  Constitutional:      Appearance: He is well-developed.  HENT:  Head: Normocephalic and atraumatic.     Right Ear: External ear normal.     Left Ear: External ear normal.     Nose: Nose normal.  Eyes:     General: No scleral icterus.    Conjunctiva/sclera: Conjunctivae normal.  Neck:     Thyroid: No thyromegaly.  Cardiovascular:     Rate and Rhythm: Normal rate and regular rhythm.     Heart sounds: Normal heart sounds.  Pulmonary:     Effort: Pulmonary effort is normal.     Breath sounds: Normal breath sounds.  Abdominal:     Palpations: Abdomen is soft.  Musculoskeletal:     Right lower leg: No edema.     Left lower leg: No edema.  Skin:    General: Skin is warm and dry.  Neurological:     General: No focal deficit present.     Mental Status: He is alert and oriented to person, place, and time.  Psychiatric:        Mood and Affect: Mood  normal.        Behavior: Behavior normal.        Thought Content: Thought content normal.        Judgment: Judgment normal.       No results found for any visits on 02/19/21.  Assessment & Plan     1. Essential (primary) hypertension Increase hydralazine from 50 mg twice daily to 100 mg twice daily  2. Acute left hemiparesis (HCC) History of CVA.  All risk factors treated.  Neurologic exam back to normal  3. Paroxysmal supraventricular tachycardia (HCC)   4. Cerebrovascular accident (CVA) due to embolism of right middle cerebral artery ([place])   5. Hypercholesterolemia On Lipitor 40 mg daily. Follow-up 2 months Patient wishes to wait on Covid vaccine until his next follow-up.  This will be his booster   No follow-ups on file.      I, [name], MD, have reviewed all documentation for this visit. The documentation on 02/19/21 for the exam, diagnosis, procedures, and orders are all accurate and complete.    Panagiota Perfetti Cranford Mon, MD  [facility] [phone] (phone) [phone] (fax)  [place]

## 2021-02-19 ENCOUNTER — Ambulatory Visit (INDEPENDENT_AMBULATORY_CARE_PROVIDER_SITE_OTHER): Payer: Medicare Other | Admitting: Family Medicine

## 2021-02-19 ENCOUNTER — Encounter: Payer: Self-pay | Admitting: Family Medicine

## 2021-02-19 ENCOUNTER — Other Ambulatory Visit: Payer: Self-pay

## 2021-02-19 VITALS — BP 169/90 | HR 79 | Temp 98.4°F | Resp 16 | Ht 68.0 in | Wt 210.0 lb

## 2021-02-19 DIAGNOSIS — I63411 Cerebral infarction due to embolism of right middle cerebral artery: Secondary | ICD-10-CM

## 2021-02-19 DIAGNOSIS — G8194 Hemiplegia, unspecified affecting left nondominant side: Secondary | ICD-10-CM

## 2021-02-19 DIAGNOSIS — E7800 Pure hypercholesterolemia, unspecified: Secondary | ICD-10-CM

## 2021-02-19 DIAGNOSIS — I471 Supraventricular tachycardia: Secondary | ICD-10-CM | POA: Diagnosis not present

## 2021-02-19 DIAGNOSIS — I10 Essential (primary) hypertension: Secondary | ICD-10-CM | POA: Diagnosis not present

## 2021-02-19 MED ORDER — HYDRALAZINE HCL 100 MG PO TABS: 100.0000 mg | ORAL_TABLET | Freq: Two times a day (BID) | ORAL | 3 refills | Status: DC

## 2021-04-18 ENCOUNTER — Other Ambulatory Visit: Payer: Self-pay | Admitting: Family Medicine

## 2021-04-18 DIAGNOSIS — I10 Essential (primary) hypertension: Secondary | ICD-10-CM

## 2021-04-18 NOTE — Telephone Encounter (Signed)
Requested Prescriptions  Pending Prescriptions Disp Refills  . amLODipine (NORVASC) 10 MG tablet [Pharmacy Med Name: AMLODIPINE BESYLATE 10 MG TAB] 90 tablet 1    Sig: TAKE 1 TABLET BY MOUTH EVERY DAY     Cardiovascular:  Calcium Channel Blockers Failed - 04/18/2021  1:50 AM      Failed - Last BP in normal range    BP Readings from Last 1 Encounters:  02/19/21 (!) 169/90         Passed - Valid encounter within last 6 months    Recent Outpatient Visits          1 month ago Essential (primary) hypertension   [place] [name], MD   4 months ago Need for COVID-19 vaccine   [facility] [name], MD   6 months ago Essential (primary) hypertension   [facility] [name], MD   6 months ago Essential (primary) hypertension   [facility] [name], MD   7 months ago Annual physical exam   [facility] [name], MD      Future Appointments            In 1 month [name], MD [facility] [place]

## 2021-05-27 ENCOUNTER — Encounter (HOSPITAL_COMMUNITY): Payer: Self-pay

## 2021-05-27 ENCOUNTER — Emergency Department (HOSPITAL_COMMUNITY): Payer: Medicare Other

## 2021-05-27 ENCOUNTER — Observation Stay (HOSPITAL_COMMUNITY): Payer: Medicare Other

## 2021-05-27 ENCOUNTER — Observation Stay (HOSPITAL_COMMUNITY)
Admission: EM | Admit: 2021-05-27 | Discharge: 2021-05-28 | Disposition: A | Discharge status: Home / Self Care | Payer: Medicare Other | Attending: Family Medicine | Admitting: Family Medicine

## 2021-05-27 DIAGNOSIS — I2699 Other pulmonary embolism without acute cor pulmonale: Secondary | ICD-10-CM | POA: Diagnosis not present

## 2021-05-27 DIAGNOSIS — Z87891 Personal history of nicotine dependence: Secondary | ICD-10-CM | POA: Diagnosis not present

## 2021-05-27 DIAGNOSIS — M545 Low back pain, unspecified: Secondary | ICD-10-CM

## 2021-05-27 DIAGNOSIS — I2489 Other forms of acute ischemic heart disease: Secondary | ICD-10-CM | POA: Diagnosis present

## 2021-05-27 DIAGNOSIS — G319 Degenerative disease of nervous system, unspecified: Secondary | ICD-10-CM | POA: Diagnosis not present

## 2021-05-27 DIAGNOSIS — W19XXXA Unspecified fall, initial encounter: Secondary | ICD-10-CM | POA: Diagnosis not present

## 2021-05-27 DIAGNOSIS — I2510 Atherosclerotic heart disease of native coronary artery without angina pectoris: Secondary | ICD-10-CM | POA: Diagnosis not present

## 2021-05-27 DIAGNOSIS — I248 Other forms of acute ischemic heart disease: Secondary | ICD-10-CM | POA: Diagnosis not present

## 2021-05-27 DIAGNOSIS — R404 Transient alteration of awareness: Secondary | ICD-10-CM | POA: Diagnosis not present

## 2021-05-27 DIAGNOSIS — R55 Syncope and collapse: Secondary | ICD-10-CM | POA: Diagnosis not present

## 2021-05-27 DIAGNOSIS — G9389 Other specified disorders of brain: Secondary | ICD-10-CM | POA: Diagnosis not present

## 2021-05-27 DIAGNOSIS — I10 Essential (primary) hypertension: Secondary | ICD-10-CM | POA: Diagnosis not present

## 2021-05-27 DIAGNOSIS — M7989 Other specified soft tissue disorders: Secondary | ICD-10-CM

## 2021-05-27 DIAGNOSIS — Z743 Need for continuous supervision: Secondary | ICD-10-CM | POA: Diagnosis not present

## 2021-05-27 DIAGNOSIS — J439 Emphysema, unspecified: Secondary | ICD-10-CM | POA: Diagnosis not present

## 2021-05-27 DIAGNOSIS — R918 Other nonspecific abnormal finding of lung field: Secondary | ICD-10-CM | POA: Diagnosis not present

## 2021-05-27 DIAGNOSIS — Z8673 Personal history of transient ischemic attack (TIA), and cerebral infarction without residual deficits: Secondary | ICD-10-CM | POA: Insufficient documentation

## 2021-05-27 DIAGNOSIS — Z79899 Other long term (current) drug therapy: Secondary | ICD-10-CM | POA: Insufficient documentation

## 2021-05-27 DIAGNOSIS — Z20822 Contact with and (suspected) exposure to covid-19: Secondary | ICD-10-CM | POA: Insufficient documentation

## 2021-05-27 DIAGNOSIS — R911 Solitary pulmonary nodule: Secondary | ICD-10-CM | POA: Diagnosis not present

## 2021-05-27 DIAGNOSIS — Z7982 Long term (current) use of aspirin: Secondary | ICD-10-CM | POA: Diagnosis not present

## 2021-05-27 DIAGNOSIS — R4020 Unspecified coma: Secondary | ICD-10-CM | POA: Diagnosis not present

## 2021-05-27 DIAGNOSIS — I2609 Other pulmonary embolism with acute cor pulmonale: Secondary | ICD-10-CM

## 2021-05-27 DIAGNOSIS — R0902 Hypoxemia: Secondary | ICD-10-CM | POA: Diagnosis not present

## 2021-05-27 DIAGNOSIS — E7800 Pure hypercholesterolemia, unspecified: Secondary | ICD-10-CM | POA: Diagnosis not present

## 2021-05-27 HISTORY — DX: Cerebral infarction, unspecified: I63.9

## 2021-05-27 LAB — COMPREHENSIVE METABOLIC PANEL
ALT: 21 U/L (ref 0–44)
AST: 33 U/L (ref 15–41)
Albumin: 3.4 g/dL — ABNORMAL LOW (ref 3.5–5.0)
Alkaline Phosphatase: 42 U/L (ref 38–126)
Anion gap: 14 (ref 5–15)
BUN: 28 mg/dL — ABNORMAL HIGH (ref 8–23)
CO2: 20 mmol/L — ABNORMAL LOW (ref 22–32)
Calcium: 9.6 mg/dL (ref 8.9–10.3)
Chloride: 106 mmol/L (ref 98–111)
Creatinine, Ser: 1.03 mg/dL (ref 0.61–1.24)
GFR, Estimated: >60 mL/min (ref >60)
Glucose, Bld: 134 mg/dL — ABNORMAL HIGH (ref 70–99)
Potassium: 3.7 mmol/L (ref 3.5–5.1)
Sodium: 140 mmol/L (ref 135–145)
Total Bilirubin: 1.2 mg/dL (ref 0.3–1.2)
Total Protein: 6.5 g/dL (ref 6.5–8.1)

## 2021-05-27 LAB — CBC WITH DIFFERENTIAL/PLATELET
Abs Immature Granulocytes: 0.1 10*3/uL — ABNORMAL HIGH (ref 0.00–0.07)
Basophils Absolute: 0 10*3/uL (ref 0.0–0.1)
Basophils Relative: 0 %
Eosinophils Absolute: 0.1 10*3/uL (ref 0.0–0.5)
Eosinophils Relative: 1 %
HCT: 42.5 % (ref 39.0–52.0)
Hemoglobin: 13.7 g/dL (ref 13.0–17.0)
Immature Granulocytes: 1 %
Lymphocytes Relative: 9 %
Lymphs Abs: 1.3 10*3/uL (ref 0.7–4.0)
MCH: 29.7 pg (ref 26.0–34.0)
MCHC: 32.2 g/dL (ref 30.0–36.0)
MCV: 92 fL (ref 80.0–100.0)
Monocytes Absolute: 0.9 10*3/uL (ref 0.1–1.0)
Monocytes Relative: 7 %
Neutro Abs: 11.4 10*3/uL — ABNORMAL HIGH (ref 1.7–7.7)
Neutrophils Relative %: 82 %
Platelets: 128 10*3/uL — ABNORMAL LOW (ref 150–400)
RBC: 4.62 MIL/uL (ref 4.22–5.81)
RDW: 13.9 % (ref 11.5–15.5)
WBC: 13.9 10*3/uL — ABNORMAL HIGH (ref 4.0–10.5)
nRBC: 0 % (ref 0.0–0.2)

## 2021-05-27 LAB — URINALYSIS, ROUTINE W REFLEX MICROSCOPIC
Bilirubin Urine: NEGATIVE
Glucose, UA: NEGATIVE mg/dL
Ketones, ur: 20 mg/dL — AB
Leukocytes,Ua: NEGATIVE
Nitrite: NEGATIVE
Protein, ur: 30 mg/dL — AB
Specific Gravity, Urine: 1.017 (ref 1.005–1.030)
pH: 5 (ref 5.0–8.0)

## 2021-05-27 LAB — TROPONIN I (HIGH SENSITIVITY): Troponin I (High Sensitivity): 211 ng/L (ref <18)

## 2021-05-27 LAB — RESP PANEL BY RT-PCR (FLU A&B, COVID) ARPGX2
Influenza A by PCR: NEGATIVE
Influenza B by PCR: NEGATIVE
SARS Coronavirus 2 by RT PCR: NEGATIVE

## 2021-05-27 LAB — PROTIME-INR
INR: 1.3 — ABNORMAL HIGH (ref 0.8–1.2)
Prothrombin Time: 16.3 seconds — ABNORMAL HIGH (ref 11.4–15.2)

## 2021-05-27 LAB — MRSA NEXT GEN BY PCR, NASAL: MRSA by PCR Next Gen: NOT DETECTED

## 2021-05-27 MED ORDER — ACETAMINOPHEN 650 MG RE SUPP: 650.0000 mg | Freq: Four times a day (QID) | RECTAL | Status: DC | PRN

## 2021-05-27 MED ORDER — KETOROLAC TROMETHAMINE 0.5 % OP SOLN
1.0000 [drp] | Freq: Every day | OPHTHALMIC | Status: DC
  Administered 2021-05-27 – 2021-05-28 (×2): 1 [drp] via OPHTHALMIC
  Filled 2021-05-27: qty 5

## 2021-05-27 MED ORDER — HEPARIN BOLUS VIA INFUSION
4000.0000 [IU] | Freq: Once | INTRAVENOUS | Status: AC
  Administered 2021-05-27: 4000 [IU] via INTRAVENOUS
  Filled 2021-05-27: qty 4000

## 2021-05-27 MED ORDER — SODIUM CHLORIDE 0.9 % IV BOLUS
1000.0000 mL | Freq: Once | INTRAVENOUS | Status: AC
  Administered 2021-05-27: 1000 mL via INTRAVENOUS

## 2021-05-27 MED ORDER — ATORVASTATIN CALCIUM 40 MG PO TABS: 40.0000 mg | ORAL_TABLET | Freq: Every day | ORAL | Status: DC

## 2021-05-27 MED ORDER — ONDANSETRON HCL 4 MG/2ML IJ SOLN: 4.0000 mg | Freq: Four times a day (QID) | INTRAMUSCULAR | Status: DC | PRN

## 2021-05-27 MED ORDER — ALLOPURINOL 300 MG PO TABS
300.0000 mg | ORAL_TABLET | Freq: Every day | ORAL | Status: DC
  Administered 2021-05-28: 300 mg via ORAL
  Filled 2021-05-27: qty 1

## 2021-05-27 MED ORDER — HEPARIN (PORCINE) 25000 UT/250ML-% IV SOLN
1400.0000 [IU]/h | INTRAVENOUS | Status: DC
  Administered 2021-05-27: 1500 [IU]/h via INTRAVENOUS
  Administered 2021-05-28: 1400 [IU]/h via INTRAVENOUS
  Filled 2021-05-27 (×2): qty 250

## 2021-05-27 MED ORDER — METOPROLOL TARTRATE 5 MG/5ML IV SOLN: 5.0000 mg | Freq: Four times a day (QID) | INTRAVENOUS | Status: DC | PRN

## 2021-05-27 MED ORDER — BRINZOLAMIDE 1 % OP SUSP
1.0000 [drp] | Freq: Three times a day (TID) | OPHTHALMIC | Status: DC
  Administered 2021-05-27 – 2021-05-28 (×2): 1 [drp] via OPHTHALMIC
  Filled 2021-05-27: qty 10

## 2021-05-27 MED ORDER — ONDANSETRON HCL 4 MG PO TABS: 4.0000 mg | ORAL_TABLET | Freq: Four times a day (QID) | ORAL | Status: DC | PRN

## 2021-05-27 MED ORDER — ALBUTEROL SULFATE (2.5 MG/3ML) 0.083% IN NEBU: 2.5000 mg | INHALATION_SOLUTION | RESPIRATORY_TRACT | Status: DC | PRN

## 2021-05-27 MED ORDER — IOHEXOL 350 MG/ML SOLN
100.0000 mL | Freq: Once | INTRAVENOUS | Status: AC | PRN
  Administered 2021-05-27: 100 mL via INTRAVENOUS

## 2021-05-27 MED ORDER — BRIMONIDINE TARTRATE 0.2 % OP SOLN
1.0000 [drp] | Freq: Three times a day (TID) | OPHTHALMIC | Status: DC
  Administered 2021-05-27 – 2021-05-28 (×2): 1 [drp] via OPHTHALMIC
  Filled 2021-05-27: qty 5

## 2021-05-27 MED ORDER — ACETAMINOPHEN 325 MG PO TABS: 650.0000 mg | ORAL_TABLET | Freq: Four times a day (QID) | ORAL | Status: DC | PRN

## 2021-05-27 NOTE — ED Triage Notes (Signed)
Walking out of the bathroom. Family found him laying on the floor. Syncope for a few minutes. Pt was pale and diaphoretic. Hypotensive with 90s sitting with HR of 100. Alert and oriented x 4. No obvious deformity. Blood sugar 202.

## 2021-05-27 NOTE — Progress Notes (Signed)
ANTICOAGULATION CONSULT NOTE - Initial Consult  Pharmacy Consult for Heparin Indication: pulmonary embolus  No Known Allergies  Patient Measurements: Height: 5\' 8"  (172.7 cm) Weight: 97.1 kg (214 lb) IBW/kg (Calculated) : 68.4 Heparin Dosing Weight: 89kg  Vital Signs: Temp: 98.4 F (36.9 C) (06/12 1614) Temp Source: Oral (06/12 1614) BP: 128/91 (06/12 1645) Pulse Rate: 103 (06/12 1645)  Labs: Recent Labs    05/27/21 1322  HGB 13.7  HCT 42.5  PLT 128*  CREATININE 1.03    Estimated Creatinine Clearance: 62.5 mL/min (by C-G formula based on SCr of 1.03 mg/dL).   Medical History: Past Medical History:  Diagnosis Date   Hyperlipidemia    Hypertension    Stroke [facility])      Assessment: 85 yo M with extensive BL PE and RHS 05/27/21. No anticoagulation prior to admission. Pharmacy consulted for heparin. CBC wnl.    Goal of Therapy:  Heparin level 0.3-0.7 units/ml Monitor platelets by anticoagulation protocol: Yes   Plan:  Heparin 4000 unit bolus then 1500 units/hr  F/u 6hr HL  Monitor daily HL, CBC/plt Monitor for signs/symptoms of bleeding      Benetta Spar, PharmD, BCPS, BCCP Clinical Pharmacist  Please check AMION for all [place] phone numbers After 10:00 PM, call [place]

## 2021-05-27 NOTE — ED Provider Notes (Signed)
[facility] EMERGENCY DEPARTMENT Provider Note   CSN: [id number] Arrival date & time: 05/27/21  1208     History Chief Complaint  Patient presents with   Loss of Consciousness    Travis Salazar. is a 85 y.o. male with a history of hyperlipidemia, hypertension, CVA, SVT, lung nodule.  Patient presents to the emergency department with a chief complaint of syncopal episode.  Patient syncopal episode occurred just prior to his arrival in the emergency department.  Patient reports that he stood up and started walking to the bathroom when he collapsed.  He reports that he woke on the floor.  Spoke to patient's wife via phone who reports that she did not witness him fall but was there right after fall occurred.  States that patient was unresponsive for 3 to 4 minutes.  Patient had confusion after he regained consciousness.  She is unsure how long this confusion lasted.  Per triage note EMS found patient to be hypotensive and diaphoretic with heart rate of 100, ANO x4, CBG 202.  Patient denies having any chest pain, shortness of breath, sudden onset headache, or focal neurological deficit prior to or after his fall.  Patient denies any complaints at this time.  Patient denies any neck pain, saddle anesthesia, bowel or bladder dysfunction, visual disturbance, headache, nausea, vomiting, abdominal pain.  Patient endorses low back pain however states this is chronic at baseline for him.  Patient denies any change in his back pain prior to or after fall.  Patient endorses his intermittent bilateral lower leg edema at baseline.  Patient is on Plavix.  Patient denies any drug or alcohol use.  Patient endorses decreased oral intake today.     Loss of Consciousness Associated symptoms: no chest pain, no confusion, no dizziness, no fever, no headaches, no nausea, no palpitations, no seizures, no shortness of breath, no vomiting and no weakness       Past Medical History:  Diagnosis  Date   Hyperlipidemia    Hypertension    Stroke [facility])     Patient Active Problem List   Diagnosis Date Noted   Acute left hemiparesis ([place]) 04/30/2020   Paroxysmal supraventricular tachycardia ([place]) 04/30/2020   Lung nodule 01/07/2020   CVA (cerebral vascular accident) ([place]) 06/26/2019   Wrist swelling, right 06/03/2017   S/P prostatectomy 10/08/2016   Snores 10/08/2016   Essential (primary) hypertension 09/27/2015   History of tobacco use 09/27/2015   Hypercholesterolemia 09/27/2015   Blood glucose elevated 09/27/2015   Adiposity 09/27/2015    Past Surgical History:  Procedure Laterality Date   LIPOMA EXCISION     neck   PROSTATECTOMY     due to prostate cancer       Family History  Problem Relation Age of Onset   COPD Mother    Heart attack Father     Social History   Tobacco Use   Smoking status: Former    Packs/day: 1.00    Years: 50.00    Pack years: 50.00    Types: Cigarettes    Quit date: 12/15/2009    Years since quitting: 11.4   Smokeless tobacco: Never  Vaping Use   Vaping Use: Never used  Substance Use Topics   Alcohol use: No    Alcohol/week: 0.0 standard drinks   Drug use: No    Home Medications Prior to Admission medications   Medication Sig Start Date End Date Taking? Authorizing Provider  allopurinol (ZYLOPRIM) 300 MG tablet TAKE 1 TABLET  BY MOUTH EVERY DAY 01/07/21   [name], MD  amLODipine (NORVASC) 10 MG tablet TAKE 1 TABLET BY MOUTH EVERY DAY 04/18/21   [name], MD  aspirin EC 325 MG EC tablet Take 1 tablet (325 mg total) by mouth daily. 06/29/19   Mercy Riding, MD  atorvastatin (LIPITOR) 40 MG tablet TAKE 1 TABLET (40 MG TOTAL) BY MOUTH DAILY AT 6 PM. 11/11/20   [name], MD  clopidogrel (PLAVIX) 75 MG tablet TAKE 1 TABLET BY MOUTH EVERY DAY 01/07/21   [name], MD  colchicine 0.6 MG tablet Take 1 tablet (0.6 mg total) by mouth daily. 05/01/17   [name], MD   hydrALAZINE (APRESOLINE) 100 MG tablet Take 1 tablet (100 mg total) by mouth 2 (two) times daily. 02/19/21   [name], MD  hydrochlorothiazide (HYDRODIURIL) 25 MG tablet Take 1 tablet (25 mg total) by mouth 2 (two) times daily. 08/29/20   [name], MD  ketorolac (ACULAR) 0.5 % ophthalmic solution Place 1 drop into both eyes daily. 05/12/19   [provider]  lisinopril (ZESTRIL) 40 MG tablet Take 1 tablet (40 mg total) by mouth daily. 11/22/19   [name], MD  SIMBRINZA 1-0.2 % SUSP Place 1 drop into both eyes 3 (three) times daily. 06/11/19   [provider]    Allergies    Patient has no known allergies.  Review of Systems   Review of Systems  Constitutional:  Negative for chills and fever.  Eyes:  Negative for visual disturbance.  Respiratory:  Negative for shortness of breath.   Cardiovascular:  Positive for leg swelling and syncope. Negative for chest pain and palpitations.  Gastrointestinal:  Negative for abdominal distention, abdominal pain, nausea and vomiting.  Genitourinary:  Negative for difficulty urinating, dysuria, flank pain, frequency, hematuria and penile discharge.  Musculoskeletal:  Positive for back pain (chronic low back pain, unchanged). Negative for neck pain.  Skin:  Negative for color change and rash.  Neurological:  Positive for syncope. Negative for dizziness, tremors, seizures, facial asymmetry, speech difficulty, weakness, light-headedness, numbness and headaches.  Psychiatric/Behavioral:  Negative for confusion.    Physical Exam Updated Vital Signs BP 115/85   Pulse (!) 103   Temp 97.7 F (36.5 C) (Oral)   Resp 18   Ht 5\' 8"  (1.727 m)   Wt 97.1 kg   SpO2 91%   BMI 32.54 kg/m   Physical Exam Vitals and nursing note reviewed.  Constitutional:      General: He is not in acute distress.    Appearance: He is not ill-appearing, toxic-appearing or diaphoretic.  HENT:     Head: Atraumatic. No  raccoon eyes, Battle's sign, abrasion, contusion, masses, right periorbital erythema, left periorbital erythema or laceration.     Jaw: No trismus or pain on movement.     Mouth/Throat:     Pharynx: Oropharynx is clear. Uvula midline. No pharyngeal swelling, oropharyngeal exudate, posterior oropharyngeal erythema or uvula swelling.  Eyes:     General: No scleral icterus.       Right eye: No discharge.        Left eye: No discharge.     Extraocular Movements: Extraocular movements intact.     Pupils: Pupils are equal, round, and reactive to light.  Cardiovascular:     Rate and Rhythm: Normal rate.  Pulmonary:     Effort: Pulmonary effort is normal. No  tachypnea, bradypnea or respiratory distress.     Breath sounds: Normal breath sounds. No stridor.     Comments: Speaks in full complete sentences without difficulty Chest:     Chest wall: No mass, lacerations, deformity, swelling, tenderness, crepitus or edema.     Comments: No bruising or contusions to chest wall Abdominal:     General: Bowel sounds are normal. There is no distension. There are no signs of injury.     Palpations: Abdomen is soft. There is no mass or pulsatile mass.     Tenderness: There is no abdominal tenderness. There is no guarding or rebound.     Hernia: There is no hernia in the umbilical area or ventral area.     Comments: No contusion or ecchymosis noted to abdomen  Musculoskeletal:     Cervical back: Normal range of motion and neck supple. No swelling, edema, deformity, erythema, signs of trauma, lacerations, rigidity, spasms, torticollis, tenderness, bony tenderness or crepitus. No pain with movement. Normal range of motion.     Thoracic back: No swelling, edema, deformity, signs of trauma, lacerations, spasms, tenderness or bony tenderness. Normal range of motion.     Lumbar back: No swelling, edema, deformity, signs of trauma, lacerations, spasms, tenderness or bony tenderness. Normal range of motion.     Right  lower leg: No swelling, deformity, lacerations, tenderness or bony tenderness. No edema.     Left lower leg: No swelling, deformity, lacerations, tenderness or bony tenderness. 1+ Edema present.     Comments: No midline tenderness or deformity to cervical, thoracic, or lumbar spine No tenderness, bony tenderness, or deformity to bilateral upper or lower extremities  Skin:    General: Skin is warm and dry.     Coloration: Skin is not ashen, cyanotic, jaundiced or pale.  Neurological:     General: No focal deficit present.     Mental Status: He is alert and oriented to person, place, and time.     GCS: GCS eye subscore is 4. GCS verbal subscore is 5. GCS motor subscore is 6.     Cranial Nerves: No cranial nerve deficit or facial asymmetry.     Sensory: Sensation is intact.     Motor: No weakness, tremor, seizure activity or pronator drift.     Coordination: Finger-Nose-Finger Test normal.     Comments: CN II-XII intact; performed in supine position due, +5 strength to bilateral upper extremities, +5 strength to dorsiflexion and plantarflexion, patient able to left both legs against gravity and hold each there without difficulty, grip strength equal, sensation to light touch intact to bilateral upper and lower extremities  Psychiatric:        Behavior: Behavior is cooperative.    ED Results / Procedures / Treatments   Labs (all labs ordered are listed, but only abnormal results are displayed) Labs Reviewed  COMPREHENSIVE METABOLIC PANEL - Abnormal; Notable for the following components:      Result Value   CO2 20 (*)    Glucose, Bld 134 (*)    BUN 28 (*)    Albumin 3.4 (*)    All other components within normal limits  CBC WITH DIFFERENTIAL/PLATELET - Abnormal; Notable for the following components:   WBC 13.9 (*)    Platelets 128 (*)    Neutro Abs 11.4 (*)    Abs Immature Granulocytes 0.10 (*)    All other components within normal limits  URINALYSIS, ROUTINE W REFLEX MICROSCOPIC     EKG EKG Interpretation  Date/Time:  Sunday May 27 2021 12:16:29 EDT Ventricular Rate:  99 PR Interval:  185 QRS Duration: 121 QT Interval:  354 QTC Calculation: 455 R Axis:   62 Text Interpretation: Sinus or ectopic atrial tachycardia Ventricular trigeminy IVCD, consider atypical RBBB Sinus rhythm, PVCs Confirmed by Lavenia Atlas [phone]) on 05/27/2021 1:23:03 PM  Radiology CT Head Wo Contrast  Result Date: 05/27/2021 CLINICAL DATA:  Syncopal episode.  Found on the floor. EXAM: CT HEAD WITHOUT CONTRAST TECHNIQUE: Contiguous axial images were obtained from the base of the skull through the vertex without intravenous contrast. COMPARISON:  12/16/2019 FINDINGS: Brain: Old right MCA distribution infarct. Mildly enlarged ventricles and subarachnoid spaces with mild progression. No intracranial hemorrhage, mass lesion or CT evidence of acute infarction. Vascular: No hyperdense vessel or unexpected calcification. Skull: Normal. Negative for fracture or focal lesion. Sinuses/Orbits: Unremarkable. Other: None. IMPRESSION: 1. No acute abnormality. 2. Old infarct and mild atrophy. Electronically Signed   By: Claudie Revering M.D.   On: 05/27/2021 14:38   DG Chest Portable 1 View  Result Date: 05/27/2021 CLINICAL DATA:  Fall. EXAM: PORTABLE CHEST 1 VIEW COMPARISON:  CT chest dated January 05, 2020. Chest x-ray dated June 27, 2019. FINDINGS: The heart size and mediastinal contours are within normal limits. Normal pulmonary vascularity. Enlarging subpleural nodule in the right upper lobe, currently 2.1 cm, previously 1.1 cm in January 2021. No focal consolidation, pleural effusion, or pneumothorax. Similar eventration of the left hemidiaphragm and left greater than right basilar scarring. No acute osseous abnormality. IMPRESSION: 1. No active disease. 2. Enlarging subpleural nodule in the right upper lobe, currently 2.1 cm, previously 1.1 cm in January 2021. This remains concerning for primary bronchogenic  neoplasm and resection or tissue diagnosis is recommended. Electronically Signed   By: Titus Dubin M.D.   On: 05/27/2021 13:51    Procedures Procedures   Medications Ordered in ED Medications  sodium chloride 0.9 % bolus 1,000 mL (0 mLs Intravenous Stopped 05/27/21 1614)    ED Course  I have reviewed the triage vital signs and the nursing notes.  Pertinent labs & imaging results that were available during my care of the patient were reviewed by me and considered in my medical decision making (see chart for details).    MDM Rules/Calculators/A&P                          Alert 85 year old male no acute stress, nontoxic-appearing.  Patient presents emerged part with a chief complaint of syncopal episode.  Syncope occurred just prior to his arrival in the emergency department.  Occurred after patient stood and started walking.  Patient's wife reports that patient was unresponsive for 3-4 minutes.  EMS reports that patient was hypotensive and diaphoretic on their arrival.    Exam  of patient has no focal neurological deficits.  Lungs clear to auscultation bilaterally.  No midline tenderness or deformity to cervical, thoracic, or lumbar spine.  Head is atraumatic.  Abdomen soft, nondistended, nontender without mass, or pulsatile mass.  Will obtain noncontrast head CT due to patient's fall, syncope, and being on Plavix.  We will obtain presenting vital signs, chest x-ray, EKG, CBC, CMP and UA to evaluate for possible cause of syncopal episode.  EKG shows Sinus or ectopic atrial tachycardia Ventricular trigeminy IVCD, consider atypical RBBB Sinus rhythm, PVCs. Chest x-ray shows 2.1 cm subpleural nodule in the right upper lobe which is enlarged from previous.   CBC shows leukocytosis at  13.9 clear source at this time as urinalysis is pending chest x-ray shows no consolidation. CMP shows BUN elevated at 28, and within normal limits; please secondary to dehydration. Orthostatics positive from  lying to seated position.  We will give patient 1 L fluid bolus.  Due to patient's lung mass and syncopal episode will obtain CTA to evaluate for possible PE.  If CTA is unremarkable patient would still benefit from overnight observation for cardiac monitoring due to his syncopal episode and history of arrhythmia.  Patient and patient family are agreeable with this plan.  Patient care transferred to PA Geiple at the end of my shift. Patient presentation, ED course, and plan of care discussed with review of all pertinent labs and imaging. Please see his/her note for further details regarding further ED course and disposition.   Final Clinical Impression(s) / ED Diagnoses Final diagnoses:  None    Rx / DC Orders ED Discharge Orders     None        Loni Beckwith, PA-C 05/27/21 1643    [place], Alvin Critchley, DO 05/28/21 [phone]

## 2021-05-27 NOTE — H&P (Signed)
History and Physical   [employer]. WIO:[id number] DOB: 07-07-1936 DOA: 05/27/2021  Referring MD/NP/PA: Alecia Lemming, PA-C PCP: [name], MD Outpatient Specialists: Oncology, Dr. Julien Nordmann; CT surgery, Dr. Roxan Hockey; Rad Onc, Dr. Sondra Come.  Patient coming from: Home  Chief Complaint: Syncope  HPI: Travis Salazar. is an 85 y.o. male with a history of CVA, HTN, HLD, CVA, and pulmonary nodule who presented to the ED 6/12 after passing out and waking up on the ground in his house. This was unwitnessed and the patient does not recall any antecedent symptoms. Family shortly arrived, stated he was minimally responsive for a couple minutes and slowly came around to normal mentation after a bit. This has not happened before, though he has been feeling generally weaker for a couple days and noticed swelling above his sock on the left leg during that time.   Of note, on imaging work up of his stroke in July 2020, a 11mm RUL nodule was noticed which had increased to 11 x 9 mm in January 2021, found to be hypermetabolic on PET scan ordered by oncology without metastatic disease. He was referred to cardiothoracic surgery for surgical resection and to radiation oncology for radiation, both of which he declined at that time. Plan was for surveillance imaging, though he was lost to follow up until CT here revealed enlargement to 3.5 x 1.9 cm.   ED Course: CT also revealed widespread pulmonary emboli with right heart strain. Heparin gtt started and hospitalists consulted.   Review of Systems: Primary other recent complaint has been pain in the right lower back into buttock that does not radiate beyond there, feels achy, constant, worse with some movements. No LE numbness or weakness. No fever, chills, changes in vision or hearing, headache, cough, sore throat, chest pain, palpitations, shortness of breath, abdominal pain, nausea, vomiting, changes in bowel habits, blood in stool, change in bladder habits,  myalgias, arthralgias, and rash. No history of GI bleeds or hematuria, and per HPI. All others reviewed and are negative.   Past Medical History:  Diagnosis Date   Hyperlipidemia    Hypertension    Stroke [facility])    Past Surgical History:  Procedure Laterality Date   LIPOMA EXCISION     neck   PROSTATECTOMY     due to prostate cancer   - Lives at home, smoked ~1/2ppd x50 years, stopped 12 years ago. No EtOH or illicit drugs reported.   reports that he quit smoking about 11 years ago. His smoking use included cigarettes. He has a 50.00 pack-year smoking history. He has never used smokeless tobacco. He reports that he does not drink alcohol and does not use drugs. No Known Allergies Family History  Problem Relation Age of Onset   COPD Mother    Heart attack Father    - Family history otherwise reviewed and not pertinent.  Prior to Admission medications   Medication Sig Start Date End Date Taking? Authorizing Provider  allopurinol (ZYLOPRIM) 300 MG tablet TAKE 1 TABLET BY MOUTH EVERY DAY Patient taking differently: Take 300 mg by mouth daily. 01/07/21  Yes [name], MD  amLODipine (NORVASC) 10 MG tablet TAKE 1 TABLET BY MOUTH EVERY DAY Patient taking differently: Take 10 mg by mouth daily. 04/18/21  Yes [name], MD  aspirin EC 325 MG EC tablet Take 1 tablet (325 mg total) by mouth daily. 06/29/19  Yes Mercy Riding, MD  atorvastatin (LIPITOR) 40 MG tablet TAKE 1  TABLET (40 MG TOTAL) BY MOUTH DAILY AT 6 PM. 11/11/20  Yes [name], MD  clopidogrel (PLAVIX) 75 MG tablet TAKE 1 TABLET BY MOUTH EVERY DAY Patient taking differently: Take 75 mg by mouth daily. 01/07/21  Yes [name], MD  colchicine 0.6 MG tablet Take 1 tablet (0.6 mg total) by mouth daily. Patient taking differently: Take 0.6 mg by mouth as needed (gout). 05/01/17  Yes [name], MD  hydrALAZINE (APRESOLINE) 100 MG tablet Take 1 tablet (100 mg total) by mouth 2  (two) times daily. 02/19/21  Yes [name], MD  hydrochlorothiazide (HYDRODIURIL) 25 MG tablet Take 1 tablet (25 mg total) by mouth 2 (two) times daily. 08/29/20  Yes [name], MD  ketorolac (ACULAR) 0.5 % ophthalmic solution Place 1 drop into both eyes daily. 05/12/19  Yes [provider]  [place] 1-0.2 % SUSP Place 1 drop into both eyes 3 (three) times daily. 06/11/19  Yes [provider]  lisinopril (ZESTRIL) 40 MG tablet Take 1 tablet (40 mg total) by mouth daily. Patient not taking: No sig reported 11/22/19   [name], MD    Physical Exam: Vitals:   05/27/21 1711 05/27/21 1730 05/27/21 1800 05/27/21 1830  BP:  119/63 98/86 118/77  Pulse:  (!) 102 (!) 101 100  Resp:  (!) 25 16 13   Temp:      TempSrc:      SpO2: 98% 96% 94% 90%  Weight:      Height:       Constitutional: Lively elderly gentleman in no distress.  Eyes: Lids and conjunctivae normal, PERRL ENMT: Mucous membranes are moist. Posterior pharynx clear of any exudate or lesions. Fair dentition.  Neck: Supple, no masses, no thyromegaly Respiratory: Non-labored breathing room air without accessory muscle use. SpO2 dips to high 80%'s while talking but with poor pleth. SpO2 never below 90% with concomitant adequate waveform. Clear breath sounds to auscultation bilaterally Cardiovascular: Regular tachycardia, no murmurs, rubs, or gallops. No carotid bruits. No JVD. +LLE edema. Diminished symmetric pedal pulses. Abdomen: Normoactive bowel sounds. No tenderness, non-distended, and no masses palpated. No hepatosplenomegaly. GU: No indwelling catheter Musculoskeletal: No clubbing / cyanosis. No joint deformity upper and lower extremities. Good ROM, no contractures. Normal muscle tone. No tenderness to palpation along all of the palpated spine, right greater trochanter or knee. Skin: Warm, dry. No rashes, wounds, or ulcers. No significant lesions noted.  Neurologic: CN II-XII  grossly intact. Speech normal. No focal deficits in motor strength or sensation in all extremities.  Psychiatric: Alert and oriented x3. Normal judgment and insight. Mood euthymic with congruent affect.   Labs on Admission: I have personally reviewed following labs and imaging studies  CBC: Recent Labs  Lab 05/27/21 1322  WBC 13.9*  NEUTROABS 11.4*  HGB 13.7  HCT 42.5  MCV 92.0  PLT 417*   Basic Metabolic Panel: Recent Labs  Lab 05/27/21 1322  NA 140  K 3.7  CL 106  CO2 20*  GLUCOSE 134*  BUN 28*  CREATININE 1.03  CALCIUM 9.6   Liver Function Tests: Recent Labs  Lab 05/27/21 1322  AST 33  ALT 21  ALKPHOS 42  BILITOT 1.2  PROT 6.5  ALBUMIN 3.4*   Coagulation Profile: Recent Labs  Lab 05/27/21 1659  INR 1.3*   Urine analysis:    Component Value Date/Time   COLORURINE YELLOW 05/27/2021 [place] (A) 05/27/2021 1322  LABSPEC 1.017 05/27/2021 1322   PHURINE 5.0 05/27/2021 1322   GLUCOSEU NEGATIVE 05/27/2021 1322   HGBUR SMALL (A) 05/27/2021 1322   BILIRUBINUR NEGATIVE 05/27/2021 1322   KETONESUR 20 (A) 05/27/2021 1322   PROTEINUR 30 (A) 05/27/2021 1322   NITRITE NEGATIVE 05/27/2021 1322   LEUKOCYTESUR NEGATIVE 05/27/2021 1322    Collection Time: 05/27/21  5:10 PM   SARS Coronavirus 2 by RT PCR NEGATIVE NEGATIVE Final   Influenza A by PCR NEGATIVE NEGATIVE Final   Influenza B by PCR NEGATIVE NEGATIVE Final    Radiological Exams on Admission: CT Head Wo Contrast  Result Date: 05/27/2021 CLINICAL DATA:  Syncopal episode.  Found on the floor. EXAM: CT HEAD WITHOUT CONTRAST TECHNIQUE: Contiguous axial images were obtained from the base of the skull through the vertex without intravenous contrast. COMPARISON:  12/16/2019 FINDINGS: Brain: Old right MCA distribution infarct. Mildly enlarged ventricles and subarachnoid spaces with mild progression. No intracranial hemorrhage, mass lesion or CT evidence of acute infarction. Vascular: No hyperdense  vessel or unexpected calcification. Skull: Normal. Negative for fracture or focal lesion. Sinuses/Orbits: Unremarkable. Other: None. IMPRESSION: 1. No acute abnormality. 2. Old infarct and mild atrophy. Electronically Signed   By: Claudie Revering M.D.   On: 05/27/2021 14:38   CT Angio Chest PE W and/or Wo Contrast  Result Date: 05/27/2021 CLINICAL DATA:  Pulmonary embolus suspected. Syncope episode unprovoked. EXAM: CT ANGIOGRAPHY CHEST WITH CONTRAST TECHNIQUE: Multidetector CT imaging of the chest was performed using the standard protocol during bolus administration of intravenous contrast. Multiplanar CT image reconstructions and MIPs were obtained to evaluate the vascular anatomy. CONTRAST:  121mL OMNIPAQUE IOHEXOL 350 MG/ML SOLN COMPARISON:  PET CT 01/18/2020 FINDINGS: Cardiovascular: Satisfactory opacification of the pulmonary arteries to the segmental level. Multiple filling defects in vascular cut offs noted throughout all 5 lobes at the segmental and subsegmental levels. Increased right to left ventricular ratio. No pericardial effusion. Four-vessel coronary artery calcifications. The thoracic aorta is normal in caliber. Severe atherosclerotic plaque. Mediastinum/Nodes: No enlarged mediastinal, hilar, or axillary lymph nodes. Thyroid gland, trachea, and esophagus demonstrate no significant findings. Lungs/Pleura: Mild centrilobular emphysematous changes. Interval increase in size of a lobulated 3.5 x 1.9 cm (from 1.1 X 1 cm. Peripheral right upper lobe mass (6:48). Redemonstration of several scattered right lung pulmonary micronodules some of which are calcified and some noncalcified. Otherwise no new pulmonary nodule or mass. Linear atelectasis versus scarring along bilateral lung bases with no definite pulmonary infarction. No pleural effusion. No pneumothorax. Upper Abdomen: Redemonstration of several fluid density lesions within the kidneys that likely represent simple renal cysts. Subcentimeter  hypodensities are too small to characterize. Otherwise no acute abnormality. Musculoskeletal: No chest wall abnormality. No suspicious lytic or blastic osseous lesions. No acute displaced fracture. Multilevel degenerative changes of the spine. Review of the MIP images confirms the above findings. IMPRESSION: 1. Bilateral segmental and subsegmental pulmonary embolus involving all five lobes. Associated right heart strain. No pulmonary infarction. 2. Interval increase in size of an at least 3.5 x 1.9 cm peripheral right upper lobe mass. Additional imaging evaluation or consultation with Pulmonology or Thoracic Surgery recommended. 3. Other imaging findings of potential clinical significance: Several persistent calcified and noncalcified right lung pulmonary micronodules are again noted. Aortic Atherosclerosis (ICD10-I70.0) - severe, including four-vessel coronary artery calcifications. Emphysema (ICD10-J43.9) - mild. These results were called by telephone at the time of interpretation on 05/27/2021 at 4:46 pm to provider PA Carlisle Cater, who verbally acknowledged these results. Electronically Signed  By: Iven Finn M.D.   On: 05/27/2021 16:55   DG Chest Portable 1 View  Result Date: 05/27/2021 CLINICAL DATA:  Fall. EXAM: PORTABLE CHEST 1 VIEW COMPARISON:  CT chest dated January 05, 2020. Chest x-ray dated June 27, 2019. FINDINGS: The heart size and mediastinal contours are within normal limits. Normal pulmonary vascularity. Enlarging subpleural nodule in the right upper lobe, currently 2.1 cm, previously 1.1 cm in January 2021. No focal consolidation, pleural effusion, or pneumothorax. Similar eventration of the left hemidiaphragm and left greater than right basilar scarring. No acute osseous abnormality. IMPRESSION: 1. No active disease. 2. Enlarging subpleural nodule in the right upper lobe, currently 2.1 cm, previously 1.1 cm in January 2021. This remains concerning for primary bronchogenic neoplasm and  resection or tissue diagnosis is recommended. Electronically Signed   By: Titus Dubin M.D.   On: 05/27/2021 13:51    EKG: Independently reviewed. NSR w/vent rate 99bpm including 2 PVCs. No ST deviations.   Assessment/Plan Active Problems:   Acute pulmonary embolism without acute cor pulmonale (HCC)   Acute pulmonary emboli: In all 5 lobes with CT evidence of right heart strain, provoked by (suspected) malignancy. Not hypotensive, only borderline hypoxic. LLE with acute swelling is likely the DVT source of PE. - Continue heparin gtt with plans to convert to DOAC in next 24 hours - Echocardiogram  Pulmonary mass: Strongly suspected to be malignancy without biopsy proof. Pt lost to follow up after declining radiation/resection.  - Will recommend urgent revisit with oncology. Will also discuss further his goals of care, consider inpatient evaluation/assessment. - Check lumbar XR due to low right-sided back pain in setting of suspected malignancy (also w/hx prostatectomy due to cancer)  Demand myocardial ischemia: Due to heart strain due to PE in setting of CAD (4 vessel on CT).  - Would benefit from beta blocker.  - Will trend x1. On anticoagulation regardless without chest pain or ischemic ECG features.   Syncope: Strongly suspect this is due to PE. Perhaps initially saddle location compromising cardiac output then dispersion to all lobes.  - Keep on cardiac telemetry.   HTN:  - Pt is normotensive. Will hold norvasc, hydralazine, HCTZ for tonight but could restart them if BP rises.   HLD:  - Continue statin  History of CVA:  - Starting anticoagulation in place of antiplatelet, continue statin  History of tobacco use, emphysema:  - Albuterol prn.   Gout: Chronic, no flare - Continue allopurinol  DVT prophylaxis: Heparin gtt  Code Status: Full confirmed on admission  Family Communication: Daughter at bedside Disposition Plan: Home Consults called: None  Admission status:  Observation    Patrecia Pour, MD [facility] [email or web address] 05/27/2021, 6:44 PM

## 2021-05-27 NOTE — ED Notes (Addendum)
Unable to give UA at this time. Pt attempted to use the urinal. Provider notified. Also, orthostatic vs done, provider notified.

## 2021-05-27 NOTE — ED Notes (Signed)
Report called to RN receiving pt. 

## 2021-05-27 NOTE — ED Provider Notes (Signed)
5:02 PM Signout from [place] PA-C at shift change.   Patient with history of CVA on Plavix but poorly compliant, known enlarging pulmonary nodule concerning for lung cancer, however patient has elected not to have surgery for this or treatment --presents emergency department after syncopal spell today.  Patient was walking to the bathroom when he passed out.  This was witnessed by his wife.  Here he is more comfortable.  O2 sat in low 90s.  Borderline tachycardia.  He denied shortness of breath or chest pain during this episode but does not remember details about the event.  CT a of the chest shows segmental and subsegmental pulmonary embolism with right heart strain.  I have added troponin, INR, order for IV heparin.  We will send COVID test.  I discussed findings with patient and 2 daughters at bedside.  They are aware that he will need admitted to the hospital.  BP (!) 128/91   Pulse (!) 103   Temp 98.4 F (36.9 C) (Oral)   Resp 17   Ht 5\' 8"  (1.727 m)   Wt 97.1 kg   SpO2 96%   BMI 32.54 kg/m   PCP is in [place].   5:21 PM Spoke with Dr. Bonner Puna who will see.   BP 121/89   Pulse 98   Temp 98.4 F (36.9 C) (Oral)   Resp 16   Ht 5\' 8"  (1.727 m)   Wt 97.1 kg   SpO2 98%   BMI 32.54 kg/m   CRITICAL CARE Performed by: Carlisle Cater PA-C Total critical care time: 35 minutes Critical care time was exclusive of separately billable procedures and treating other patients. Critical care was necessary to treat or prevent imminent or life-threatening deterioration. Critical care was time spent personally by me on the following activities: development of treatment plan with patient and/or surrogate as well as nursing, discussions with consultants, evaluation of patient's response to treatment, examination of patient, obtaining history from patient or surrogate, ordering and performing treatments and interventions, ordering and review of laboratory studies, ordering and review of  radiographic studies, pulse oximetry and re-evaluation of patient's condition.      Carlisle Cater, PA-C 05/27/21 1722    Charlesetta Shanks, MD 05/29/21 2112

## 2021-05-28 ENCOUNTER — Telehealth: Payer: Self-pay | Admitting: Family Medicine

## 2021-05-28 ENCOUNTER — Observation Stay (HOSPITAL_BASED_OUTPATIENT_CLINIC_OR_DEPARTMENT_OTHER): Payer: Medicare Other

## 2021-05-28 ENCOUNTER — Other Ambulatory Visit (HOSPITAL_COMMUNITY): Payer: Self-pay

## 2021-05-28 ENCOUNTER — Ambulatory Visit: Payer: Self-pay | Admitting: Family Medicine

## 2021-05-28 DIAGNOSIS — I2609 Other pulmonary embolism with acute cor pulmonale: Secondary | ICD-10-CM

## 2021-05-28 DIAGNOSIS — I10 Essential (primary) hypertension: Secondary | ICD-10-CM | POA: Diagnosis not present

## 2021-05-28 DIAGNOSIS — I2699 Other pulmonary embolism without acute cor pulmonale: Secondary | ICD-10-CM | POA: Diagnosis not present

## 2021-05-28 DIAGNOSIS — Z87891 Personal history of nicotine dependence: Secondary | ICD-10-CM | POA: Diagnosis not present

## 2021-05-28 DIAGNOSIS — J439 Emphysema, unspecified: Secondary | ICD-10-CM | POA: Diagnosis not present

## 2021-05-28 LAB — CBC
HCT: 39.3 % (ref 39.0–52.0)
Hemoglobin: 12.9 g/dL — ABNORMAL LOW (ref 13.0–17.0)
MCH: 29.5 pg (ref 26.0–34.0)
MCHC: 32.8 g/dL (ref 30.0–36.0)
MCV: 89.9 fL (ref 80.0–100.0)
Platelets: 129 10*3/uL — ABNORMAL LOW (ref 150–400)
RBC: 4.37 MIL/uL (ref 4.22–5.81)
RDW: 13.9 % (ref 11.5–15.5)
WBC: 9.1 10*3/uL (ref 4.0–10.5)
nRBC: 0 % (ref 0.0–0.2)

## 2021-05-28 LAB — ECHOCARDIOGRAM COMPLETE
AR max vel: 1.44 cm2
AV Area VTI: 1.26 cm2
AV Area mean vel: 1.32 cm2
AV Mean grad: 19.3 mmHg
AV Peak grad: 33.8 mmHg
Ao pk vel: 2.91 m/s
Area-P 1/2: 3.21 cm2
Calc EF: 43.6 %
Height: 68 in
S' Lateral: 1.9 cm
Single Plane A2C EF: 50 %
Single Plane A4C EF: 42.6 %
Weight: 3054.69 oz

## 2021-05-28 LAB — HEPARIN LEVEL (UNFRACTIONATED)
Heparin Unfractionated: 0.85 IU/mL — ABNORMAL HIGH (ref 0.30–0.70)
Heparin Unfractionated: 0.63 IU/mL (ref 0.30–0.70)

## 2021-05-28 LAB — TROPONIN I (HIGH SENSITIVITY): Troponin I (High Sensitivity): 195 ng/L (ref <18)

## 2021-05-28 MED ORDER — APIXABAN (ELIQUIS) VTE STARTER PACK (10MG AND 5MG)
ORAL_TABLET | ORAL | 0 refills | Status: DC
  Filled 2021-05-28: qty 74, 30d supply, fill #0

## 2021-05-28 MED ORDER — APIXABAN 5 MG PO TABS
10.0000 mg | ORAL_TABLET | Freq: Two times a day (BID) | ORAL | Status: DC
  Administered 2021-05-28: 10 mg via ORAL
  Filled 2021-05-28: qty 2

## 2021-05-28 MED ORDER — COLCHICINE 0.6 MG PO TABS: 0.6000 mg | ORAL_TABLET | ORAL | Status: DC | PRN

## 2021-05-28 MED ORDER — APIXABAN 5 MG PO TABS: 5.0000 mg | ORAL_TABLET | Freq: Two times a day (BID) | ORAL | Status: DC

## 2021-05-28 NOTE — Telephone Encounter (Signed)
Kim Pharmacist with [facility] is calling to ask if Dr Rosanna Randy can refill the pts apixaban (ELIQUIS) tablet 5 mg   [[id number]] when comes in for hospital follow up.

## 2021-05-28 NOTE — Telephone Encounter (Signed)
Patient had an appt today, but it looks like he is currently in the hospital.

## 2021-05-28 NOTE — TOC Transition Note (Signed)
Transition of Care [facility]) - CM/SW Discharge Note   Patient Details  Name: Travis Salazar. MRN: [id number] Date of Birth: Feb 05, 1936  [facility] CM/SW Contact:  Zenon Mayo, RN Phone Number: 05/28/2021, 12:12 PM   Clinical Narrative:    Patient is for dc today, TOC filled meds for eliquis, patient has them already.  NCM informed patient of copay cost for eliquis for his refills and the preferred pharmacy.  He states he has transportation to go home.   Final next level of care: Home/Self Care Barriers to Discharge: No Barriers Identified   Patient Goals and CMS Choice Patient states their goals for this hospitalization and ongoing recovery are:: return home   Choice offered to / list presented to : NA  Discharge Placement                       Discharge Plan and Services                          HH Arranged: NA          Social Determinants of Health (SDOH) Interventions     Readmission Risk Interventions No flowsheet data found.

## 2021-05-28 NOTE — Discharge Summary (Addendum)
Physician Discharge Summary  [name] RDE:[id number] DOB: Apr 29, 1936 DOA: 05/27/2021  PCP: [name], MD  Admit date: 05/27/2021 Discharge date: 05/28/2021  Admitted From: Home Disposition: Home   Recommendations for Outpatient Follow-up:  Follow up with oncology ASAP for plan of care discussion regarding enlarging pulmonary nodule strongly suspected to be malignancy (pt declined biopsy previously and during this admission).  Started eliquis for bilateral pulmonary emboli.  Home Health: None Equipment/Devices: None Discharge Condition: Stable CODE STATUS: Full Diet recommendation: Heart healthy  Brief/Interim Summary: Micah Barnier. is an 85 y.o. male with a history of CVA, HTN, HLD, CVA, and pulmonary nodule who presented to the ED 6/12 after passing out and waking up on the ground in his house. CT chest was performed, revealing widespread pulmonary emboli with right heart strain (in addition to enlarging known RUL pulmonary opacity). Heparin gtt started and hospitalists consulted. Echocardiogram the following day showed normal RV size and function. Anticoagulation changed to eliquis after discussion with patient and care management, and the patient was very eager to discharge. He was not hypoxic and remained hemodynamically stable.  Discharge Diagnoses:  Principal Problem:   Acute pulmonary embolism without acute cor pulmonale (HCC) Active Problems:   Essential (primary) hypertension   History of tobacco use   Hypercholesterolemia   Pulmonary mass   Demand ischemia (HCC)   CAD (coronary artery disease)   Emphysema lung (HCC)  Acute pulmonary emboli: In all 5 lobes with CT evidence of right heart strain, provoked by (suspected) malignancy. Not hypotensive, only borderline hypoxic initially. Pt not hypoxic with ambulation on day of discharge. LLE with acute swelling is likely the DVT source of PE. Echo showed normal RV size and function.  - Converted to eliquis  prior to discharge with pharmacy teaching and care management facilitation.   Suspected lung CA: On imaging work up of his stroke in July 2020, a 89mm RUL nodule was noticed which had increased to 11 x 9 mm in January 2021, found to be hypermetabolic on PET scan ordered by oncology without metastatic disease. He was referred to cardiothoracic surgery for surgical resection and to radiation oncology for radiation, both of which he declined at that time. Plan was for surveillance imaging, though he was lost to follow up until CT here revealed enlargement to 3.5 x 1.9 cm.  - Discussed with oncology who recommended biopsy (particularly would be helpful while on heparin instead of DOAC) though pt declined, said "just let me go home."  - Importance of urgent follow up was impressed upon him multiple times through observation period. The patient states he will speak with his family to decide his next steps. Palliative care involvement me appropriate going forward.   Demand myocardial ischemia: Due to heart strain due to PE in setting of CAD (4 vessel on CT). No chest pain or ischemic ECG changes. - Would benefit from beta blocker, defer to PCP (pt is averse to medications/medical interventions)   Syncope: Strongly suspect this is due to PE. Perhaps initially saddle location compromising cardiac output then dispersion to all lobes. - No dysrhythmias detected on telemetry monitoring during hospitalization   HTN: - Ok to restart home medications at DC.   HLD: - Continue statin   History of CVA: - Starting anticoagulation in place of antiplatelet, continue statin   History of tobacco use, emphysema: - Albuterol prn.   Gout: Chronic, no flare - Continue allopurinol  Discharge Instructions Discharge Instructions     Diet -  low sodium heart healthy   Complete by: As directed    Discharge instructions   Complete by: As directed    You were admitted for a passing out episode which was due to blood  clots traveling to your lungs which are now being treated with a blood thinner called ELIQUIS. Continue taking this as directed and immediately report any signs of bleeding to your doctor. Follow up with your PCP in the next 1-2 weeks for recheck or seek medical attention right away if you develop bleeding, shortness of breath, chest pain or are unable to take the blood thinner.   It is also very important that you speak with your family and return to the oncologist's office to discuss the lung mass. You saw Dr. Julien Nordmann last time. I've included contact information for you to call to schedule an appointment as soon as possible. I've also alerted him to your situation.      Allergies as of 05/28/2021   No Known Allergies      Medication List     STOP taking these medications    aspirin 325 MG EC tablet   clopidogrel 75 MG tablet Commonly known as: PLAVIX   lisinopril 40 MG tablet Commonly known as: ZESTRIL       TAKE these medications    allopurinol 300 MG tablet Commonly known as: ZYLOPRIM TAKE 1 TABLET BY MOUTH EVERY DAY   amLODipine 10 MG tablet Commonly known as: NORVASC TAKE 1 TABLET BY MOUTH EVERY DAY   atorvastatin 40 MG tablet Commonly known as: LIPITOR TAKE 1 TABLET (40 MG TOTAL) BY MOUTH DAILY AT 6 PM.   colchicine 0.6 MG tablet Take 1 tablet (0.6 mg total) by mouth as needed (gout).   Eliquis DVT/PE Starter Pack Generic drug: Apixaban Starter Pack (10mg  and 5mg ) Take as directed on package: start with two-5mg  tablets twice daily for 7 days. On day 8, switch to one-5mg  tablet twice daily.   hydrALAZINE 100 MG tablet Commonly known as: APRESOLINE Take 1 tablet (100 mg total) by mouth 2 (two) times daily.   hydrochlorothiazide 25 MG tablet Commonly known as: HYDRODIURIL Take 1 tablet (25 mg total) by mouth 2 (two) times daily.   ketorolac 0.5 % ophthalmic solution Commonly known as: ACULAR Place 1 drop into both eyes daily.   Simbrinza 1-0.2 %  Susp Generic drug: Brinzolamide-Brimonidine Place 1 drop into both eyes 3 (three) times daily.        Follow-up Information     [name], MD Follow up.   Specialty: Family Medicine Contact information: [address] [place] [address] [phone]         Curt Bears, MD. Schedule an appointment as soon as possible for a visit.   Specialty: Oncology Why: Call to schedule an appointment as soon as possible to discuss work up and treatment for suspected lung cancer Contact information: [place] [address] [phone]                No Known Allergies  Consultations: None  Procedures/Studies: DG Lumbar Spine 2-3 Views  Result Date: 05/27/2021 CLINICAL DATA:  Low back pain. EXAM: LUMBAR SPINE - 2-3 VIEW COMPARISON:  PET CT 01/18/2020 FINDINGS: Five non-rib-bearing lumbar vertebral bodies. There is no evidence of lumbar spine fracture. Grade 1 anterolisthesis of L4 on L5. Multilevel at least moderate degenerative changes of the spine with intervertebral disc space narrowing. Severe atherosclerotic plaque. Excreted intravenous contrast noted opacifying the  urinary bladder from prior CT angio chest 05/27/2021. IMPRESSION: 1. No acute fracture or traumatic listhesis of the lumbar spine. 2.  Aortic Atherosclerosis (ICD10-I70.0). Electronically Signed   By: Iven Finn M.D.   On: 05/27/2021 20:09   CT Head Wo Contrast  Result Date: 05/27/2021 CLINICAL DATA:  Syncopal episode.  Found on the floor. EXAM: CT HEAD WITHOUT CONTRAST TECHNIQUE: Contiguous axial images were obtained from the base of the skull through the vertex without intravenous contrast. COMPARISON:  12/16/2019 FINDINGS: Brain: Old right MCA distribution infarct. Mildly enlarged ventricles and subarachnoid spaces with mild progression. No intracranial hemorrhage, mass lesion or CT evidence of acute infarction. Vascular: No hyperdense vessel or  unexpected calcification. Skull: Normal. Negative for fracture or focal lesion. Sinuses/Orbits: Unremarkable. Other: None. IMPRESSION: 1. No acute abnormality. 2. Old infarct and mild atrophy. Electronically Signed   By: Claudie Revering M.D.   On: 05/27/2021 14:38   CT Angio Chest PE W and/or Wo Contrast  Result Date: 05/27/2021 CLINICAL DATA:  Pulmonary embolus suspected. Syncope episode unprovoked. EXAM: CT ANGIOGRAPHY CHEST WITH CONTRAST TECHNIQUE: Multidetector CT imaging of the chest was performed using the standard protocol during bolus administration of intravenous contrast. Multiplanar CT image reconstructions and MIPs were obtained to evaluate the vascular anatomy. CONTRAST:  154mL OMNIPAQUE IOHEXOL 350 MG/ML SOLN COMPARISON:  PET CT 01/18/2020 FINDINGS: Cardiovascular: Satisfactory opacification of the pulmonary arteries to the segmental level. Multiple filling defects in vascular cut offs noted throughout all 5 lobes at the segmental and subsegmental levels. Increased right to left ventricular ratio. No pericardial effusion. Four-vessel coronary artery calcifications. The thoracic aorta is normal in caliber. Severe atherosclerotic plaque. Mediastinum/Nodes: No enlarged mediastinal, hilar, or axillary lymph nodes. Thyroid gland, trachea, and esophagus demonstrate no significant findings. Lungs/Pleura: Mild centrilobular emphysematous changes. Interval increase in size of a lobulated 3.5 x 1.9 cm (from 1.1 X 1 cm. Peripheral right upper lobe mass (6:48). Redemonstration of several scattered right lung pulmonary micronodules some of which are calcified and some noncalcified. Otherwise no new pulmonary nodule or mass. Linear atelectasis versus scarring along bilateral lung bases with no definite pulmonary infarction. No pleural effusion. No pneumothorax. Upper Abdomen: Redemonstration of several fluid density lesions within the kidneys that likely represent simple renal cysts. Subcentimeter hypodensities are  too small to characterize. Otherwise no acute abnormality. Musculoskeletal: No chest wall abnormality. No suspicious lytic or blastic osseous lesions. No acute displaced fracture. Multilevel degenerative changes of the spine. Review of the MIP images confirms the above findings. IMPRESSION: 1. Bilateral segmental and subsegmental pulmonary embolus involving all five lobes. Associated right heart strain. No pulmonary infarction. 2. Interval increase in size of an at least 3.5 x 1.9 cm peripheral right upper lobe mass. Additional imaging evaluation or consultation with Pulmonology or Thoracic Surgery recommended. 3. Other imaging findings of potential clinical significance: Several persistent calcified and noncalcified right lung pulmonary micronodules are again noted. Aortic Atherosclerosis (ICD10-I70.0) - severe, including four-vessel coronary artery calcifications. Emphysema (ICD10-J43.9) - mild. These results were called by telephone at the time of interpretation on 05/27/2021 at 4:46 pm to provider PA Carlisle Cater, who verbally acknowledged these results. Electronically Signed   By: Iven Finn M.D.   On: 05/27/2021 16:55   DG Chest Portable 1 View  Result Date: 05/27/2021 CLINICAL DATA:  Fall. EXAM: PORTABLE CHEST 1 VIEW COMPARISON:  CT chest dated January 05, 2020. Chest x-ray dated June 27, 2019. FINDINGS: The heart size and mediastinal contours are within normal limits. Normal pulmonary vascularity.  Enlarging subpleural nodule in the right upper lobe, currently 2.1 cm, previously 1.1 cm in January 2021. No focal consolidation, pleural effusion, or pneumothorax. Similar eventration of the left hemidiaphragm and left greater than right basilar scarring. No acute osseous abnormality. IMPRESSION: 1. No active disease. 2. Enlarging subpleural nodule in the right upper lobe, currently 2.1 cm, previously 1.1 cm in January 2021. This remains concerning for primary bronchogenic neoplasm and resection or tissue  diagnosis is recommended. Electronically Signed   By: Titus Dubin M.D.   On: 05/27/2021 13:51   ECHOCARDIOGRAM COMPLETE  Result Date: 05/28/2021    ECHOCARDIOGRAM REPORT   Patient Name:   Arsenio Schnorr. Date of Exam: 05/28/2021 Medical Rec #:  [id number]       Height:       68.0 in Accession #:    [id number]      Weight:       190.9 lb Date of Birth:  1935-12-18       BSA:          2.004 m Patient Age:    85 years        BP:           109/67 mmHg Patient Gender: M               HR:           83 bpm. Exam Location:  Inpatient Procedure: 2D Echo, Cardiac Doppler and Color Doppler Indications:    Pulmonary embolus  History:        Patient has prior history of Echocardiogram examinations, most                 recent 06/28/2019. Stroke; Risk Factors:Hypertension and                 Dyslipidemia.  Sonographer:    Luisa Hart RDCS Referring Phys: [place]  1. Left ventricular ejection fraction, by estimation, is 60 to 65%. The left ventricle has normal function. The left ventricle has no regional wall motion abnormalities. There is mild left ventricular hypertrophy. Left ventricular diastolic parameters are consistent with Grade I diastolic dysfunction (impaired relaxation).  2. Right ventricular systolic function is normal. The right ventricular size is normal. There is moderate to severely elevated pulmonary artery systolic pressure.  3. The mitral valve is normal in structure. No evidence of mitral valve regurgitation.  4. The aortic valve is calcified. There is severe calcifcation of the aortic valve. Aortic valve regurgitation is not visualized. Moderate aortic valve stenosis. Vmax 3.3 m/s, MG 24 mmHg, AVA 1.0 cm^2, DI 0.3 FINDINGS  Left Ventricle: Left ventricular ejection fraction, by estimation, is 60 to 65%. The left ventricle has normal function. The left ventricle has no regional wall motion abnormalities. The left ventricular internal cavity size was normal in size. There is  mild  left ventricular hypertrophy. Left ventricular diastolic parameters are consistent with Grade I diastolic dysfunction (impaired relaxation). Right Ventricle: The right ventricular size is normal. No increase in right ventricular wall thickness. Right ventricular systolic function is normal. There is severely elevated pulmonary artery systolic pressure. The tricuspid regurgitant velocity is 3.39 m/s, and with an assumed right atrial pressure of 15 mmHg, the estimated right ventricular systolic pressure is 06.2 mmHg. Left Atrium: Left atrial size was normal in size. Right Atrium: Right atrial size was normal in size. Pericardium: There is no evidence of pericardial effusion. Mitral Valve: The mitral valve is normal in structure. No  evidence of mitral valve regurgitation. Tricuspid Valve: The tricuspid valve is normal in structure. Tricuspid valve regurgitation is mild. Aortic Valve: The aortic valve is calcified. There is severe calcifcation of the aortic valve. Aortic valve regurgitation is not visualized. Moderate aortic stenosis is present. Aortic valve mean gradient measures 19.2 mmHg. Aortic valve peak gradient measures 33.8 mmHg. Aortic valve area, by VTI measures 1.26 cm. Pulmonic Valve: The pulmonic valve was not well visualized. Pulmonic valve regurgitation is not visualized. Aorta: The aortic root and ascending aorta are structurally normal, with no evidence of dilitation. IAS/Shunts: The interatrial septum was not well visualized.  LEFT VENTRICLE PLAX 2D LVIDd:         3.80 cm     Diastology LVIDs:         1.90 cm     LV e' medial:    6.20 cm/s LV PW:         1.00 cm     LV E/e' medial:  10.6 LV IVS:        1.20 cm     LV e' lateral:   8.70 cm/s LVOT diam:     2.10 cm     LV E/e' lateral: 7.5 LV SV:         77 LV SV Index:   38 LVOT Area:     3.46 cm  LV Volumes (MOD) LV vol d, MOD A2C: 64.6 ml LV vol d, MOD A4C: 78.7 ml LV vol s, MOD A2C: 32.3 ml LV vol s, MOD A4C: 45.2 ml LV SV MOD A2C:     32.3 ml LV  SV MOD A4C:     78.7 ml LV SV MOD BP:      31.1 ml RIGHT VENTRICLE RV Basal diam:  3.40 cm RV Mid diam:    2.10 cm RV S prime:     10.00 cm/s TAPSE (M-mode): 1.5 cm LEFT ATRIUM             Index       RIGHT ATRIUM           Index LA diam:        3.40 cm 1.70 cm/m  RA Area:     11.30 cm LA Vol (A2C):   60.7 ml 30.29 ml/m RA Volume:   19.10 ml  9.53 ml/m LA Vol (A4C):   25.0 ml 12.48 ml/m LA Biplane Vol: 39.0 ml 19.46 ml/m  AORTIC VALVE AV Area (Vmax):    1.44 cm AV Area (Vmean):   1.32 cm AV Area (VTI):     1.26 cm AV Vmax:           290.75 cm/s AV Vmean:          204.250 cm/s AV VTI:            0.606 m AV Peak Grad:      33.8 mmHg AV Mean Grad:      19.2 mmHg LVOT Vmax:         121.00 cm/s LVOT Vmean:        77.900 cm/s LVOT VTI:          0.221 m LVOT/AV VTI ratio: 0.36  AORTA Ao Root diam: 3.30 cm Ao Asc diam:  2.80 cm MITRAL VALVE                TRICUSPID VALVE MV Area (PHT): 3.21 cm     TR Peak grad:   46.0 mmHg MV Decel Time: 236 msec  TR Vmax:        339.00 cm/s MV E velocity: 65.60 cm/s MV A velocity: 131.00 cm/s  SHUNTS MV E/A ratio:  0.50         Systemic VTI:  0.22 m                             Systemic Diam: 2.10 cm Oswaldo Milian MD Electronically signed by Oswaldo Milian MD Signature Date/Time: 05/28/2021/11:33:04 AM    Final     Subjective: Feels well, no dyspnea or chest pain. No bleeding noted on heparin. Wants to go home.  Discharge Exam: Vitals:   05/28/21 0800 05/28/21 1105  BP:  131/71  Pulse: 91 83  Resp: 14 15  Temp:  98 F (36.7 C)  SpO2: 96% 96%   General: Pt is alert, awake, not in acute distress Cardiovascular: RRR, S1/S2 +, no rubs, no gallops Respiratory: CTA bilaterally, no wheezing, no rhonchi Abdominal: Soft, NT, ND, bowel sounds + Extremities: LLE edema is stable, nontender, no cyanosis  Labs: BNP (last 3 results) No results for input(s): BNP in the last 8760 hours. Basic Metabolic Panel: Recent Labs  Lab 05/27/21 1322  NA 140  K 3.7   CL 106  CO2 20*  GLUCOSE 134*  BUN 28*  CREATININE 1.03  CALCIUM 9.6   Liver Function Tests: Recent Labs  Lab 05/27/21 1322  AST 33  ALT 21  ALKPHOS 42  BILITOT 1.2  PROT 6.5  ALBUMIN 3.4*   No results for input(s): LIPASE, AMYLASE in the last 168 hours. No results for input(s): AMMONIA in the last 168 hours. CBC: Recent Labs  Lab 05/27/21 1322 05/28/21 0010  WBC 13.9* 9.1  NEUTROABS 11.4*  --   HGB 13.7 12.9*  HCT 42.5 39.3  MCV 92.0 89.9  PLT 128* 129*   Cardiac Enzymes: No results for input(s): CKTOTAL, CKMB, CKMBINDEX, TROPONINI in the last 168 hours. BNP: Invalid input(s): POCBNP CBG: No results for input(s): GLUCAP in the last 168 hours. D-Dimer No results for input(s): DDIMER in the last 72 hours. Hgb A1c No results for input(s): HGBA1C in the last 72 hours. Lipid Profile No results for input(s): CHOL, HDL, LDLCALC, TRIG, CHOLHDL, LDLDIRECT in the last 72 hours. Thyroid function studies No results for input(s): TSH, T4TOTAL, T3FREE, THYROIDAB in the last 72 hours.  Invalid input(s): FREET3 Anemia work up No results for input(s): VITAMINB12, FOLATE, FERRITIN, TIBC, IRON, RETICCTPCT in the last 72 hours. Urinalysis    Component Value Date/Time   COLORURINE YELLOW 05/27/2021 1322   APPEARANCEUR HAZY (A) 05/27/2021 1322   LABSPEC 1.017 05/27/2021 1322   PHURINE 5.0 05/27/2021 1322   GLUCOSEU NEGATIVE 05/27/2021 1322   HGBUR SMALL (A) 05/27/2021 1322   BILIRUBINUR NEGATIVE 05/27/2021 1322   KETONESUR 20 (A) 05/27/2021 1322   PROTEINUR 30 (A) 05/27/2021 1322   NITRITE NEGATIVE 05/27/2021 1322   LEUKOCYTESUR NEGATIVE 05/27/2021 1322    Microbiology Recent Results (from the past 240 hour(s))  Resp Panel by RT-PCR (Flu A&B, Covid) Nasopharyngeal Swab     Status: None   Collection Time: 05/27/21  5:10 PM   Specimen: Nasopharyngeal Swab; Nasopharyngeal(NP) swabs in vial transport medium  Result Value Ref Range Status   SARS Coronavirus 2 by RT PCR  NEGATIVE NEGATIVE Final    Comment: (NOTE) SARS-CoV-2 target nucleic acids are NOT DETECTED.  The SARS-CoV-2 RNA is generally detectable in upper respiratory specimens during the acute phase  of infection. The lowest concentration of SARS-CoV-2 viral copies this assay can detect is 138 copies/mL. A negative result does not preclude SARS-Cov-2 infection and should not be used as the sole basis for treatment or other patient management decisions. A negative result may occur with  improper specimen collection/handling, submission of specimen other than nasopharyngeal swab, presence of viral mutation(s) within the areas targeted by this assay, and inadequate number of viral copies(<138 copies/mL). A negative result must be combined with clinical observations, patient history, and epidemiological information. The expected result is Negative.  Fact Sheet for Patients:  EntrepreneurPulse.com.au  Fact Sheet for Healthcare Providers:  IncredibleEmployment.be  This test is no t yet approved or cleared by the [place] FDA and  has been authorized for detection and/or diagnosis of SARS-CoV-2 by FDA under an Emergency Use Authorization (EUA). This EUA will remain  in effect (meaning this test can be used) for the duration of the COVID-19 declaration under Section 564(b)(1) of the Act, 21 U.S.C.section 360bbb-3(b)(1), unless the authorization is terminated  or revoked sooner.       Influenza A by PCR NEGATIVE NEGATIVE Final   Influenza B by PCR NEGATIVE NEGATIVE Final    Comment: (NOTE) The Xpert Xpress SARS-CoV-2/FLU/RSV plus assay is intended as an aid in the diagnosis of influenza from Nasopharyngeal swab specimens and should not be used as a sole basis for treatment. Nasal washings and aspirates are unacceptable for Xpert Xpress SARS-CoV-2/FLU/RSV testing.  Fact Sheet for Patients: EntrepreneurPulse.com.au  Fact Sheet for  Healthcare Providers: IncredibleEmployment.be  This test is not yet approved or cleared by the [place] FDA and has been authorized for detection and/or diagnosis of SARS-CoV-2 by FDA under an Emergency Use Authorization (EUA). This EUA will remain in effect (meaning this test can be used) for the duration of the COVID-19 declaration under Section 564(b)(1) of the Act, 21 U.S.C. section 360bbb-3(b)(1), unless the authorization is terminated or revoked.  Performed at [facility], [place] [address], [place], [place] [address]   MRSA Next Gen by PCR, Nasal     Status: None   Collection Time: 05/27/21  9:11 PM  Result Value Ref Range Status   MRSA by PCR Next Gen NOT DETECTED NOT DETECTED Final    Comment: (NOTE) The GeneXpert MRSA Assay (FDA approved for NASAL specimens only), is one component of a comprehensive MRSA colonization surveillance program. It is not intended to diagnose MRSA infection nor to guide or monitor treatment for MRSA infections. Test performance is not FDA approved in patients less than 77 years old. Performed at [facility], [place] [address] [place], [place] [address]     Time coordinating discharge: Approximately 40 minutes  Patrecia Pour, MD  [facility] 05/30/2021, 2:18 PM

## 2021-05-28 NOTE — Progress Notes (Addendum)
ANTICOAGULATION CONSULT NOTE - Follow Up Consult  Pharmacy Consult for heparin Indication: pulmonary embolus  Labs: Recent Labs    05/27/21 1322 05/27/21 1659 05/27/21 2140 05/28/21 0010  HGB 13.7  --   --  12.9*  HCT 42.5  --   --  39.3  PLT 128*  --   --  129*  LABPROT  --  16.3*  --   --   INR  --  1.3*  --   --   HEPARINUNFRC  --   --   --  0.85*  CREATININE 1.03  --   --   --   TROPONINIHS  --  211* 195*  --      Assessment: 85 yo M with extensive BL PE and RHS 05/27/21. No anticoagulation prior to admission. Pharmacy consulted for heparin. CBC WNL.  Heparin level came back therapeutic at 0.63, on 1400 units/hr. Plan to transition to apixaban - Hgb 12.9, plt 129. No s/sx of bleeding or infusion issues. Scr 1.03 (CrCl 59 mL/min).   Goal of Therapy:  Heparin level 0.3-0.7 units/ml   Plan:  Stop heparin infusion Start apixaban 10 mg BID for 7 days then 5 mg BID  Monitor renal fx, CBC, and for s/sx of bleeding   Antonietta Jewel, PharmD, [place] Pharmacist  Phone: [phone] 05/28/2021 10:26 AM  Please check AMION for all [place] phone numbers After 10:00 PM, call [place] [phone]

## 2021-05-28 NOTE — Progress Notes (Signed)
ANTICOAGULATION CONSULT NOTE - Follow Up Consult  Pharmacy Consult for heparin Indication: pulmonary embolus  Labs: Recent Labs    05/27/21 1322 05/27/21 1659 05/27/21 2140 05/28/21 0010  HGB 13.7  --   --  12.9*  HCT 42.5  --   --  39.3  PLT 128*  --   --  129*  LABPROT  --  16.3*  --   --   INR  --  1.3*  --   --   HEPARINUNFRC  --   --   --  0.85*  CREATININE 1.03  --   --   --   TROPONINIHS  --  211* 195*  --     Assessment: 85yo male supratherapeutic on heparin with initial dosing for PE; no gtt issues or signs of bleeding per RN.  Goal of Therapy:  Heparin level 0.3-0.7 units/ml   Plan:  Will decrease heparin gtt by 1-2 units/kg/hr to 1400 units/hr and check level in 8 hours.    Wynona Neat, PharmD, BCPS  05/28/2021,1:47 AM

## 2021-05-28 NOTE — TOC Benefit Eligibility Note (Signed)
Transition of Care [facility]) Benefit Eligibility Note    Patient Details  Name: Travis Salazar. MRN: [id number] Date of Birth: [birth year]   Medication/Dose: ELIQUIS  2.5 MG  BID  CO-PAY- $47.00  and  ELIQUIS  5 MG BID CO-PAY- $47.00  Covered?: Yes  Tier: 3 Drug  Prescription Coverage [facility]  Spoke with Person/Company/Phone Number:: JOY  @ [employer] QR # [phone]  Co-Pay: $ 47.00  Prior Approval: No  Deductible: Unmet (OUT-OF-POCKET:UNMET)       [facility]ue Phone Number: 05/28/2021, 11:18 AM

## 2021-05-28 NOTE — Discharge Instructions (Addendum)
Information on my medicine - ELIQUIS (apixaban)  Why was Eliquis prescribed for you? Eliquis was prescribed to treat blood clots that may have been found in the veins of your legs (deep vein thrombosis) or in your lungs (pulmonary embolism) and to reduce the risk of them occurring again.  What do You need to know about Eliquis ? The starting dose is 10 mg (two 5 mg tablets) taken TWICE daily for the FIRST SEVEN (7) DAYS, then on 06/04/2021  the dose is reduced to ONE 5 mg tablet taken TWICE daily.  Eliquis may be taken with or without food.   Try to take the dose about the same time in the morning and in the evening. If you have difficulty swallowing the tablet whole please discuss with your pharmacist how to take the medication safely.  Take Eliquis exactly as prescribed and DO NOT stop taking Eliquis without talking to the doctor who prescribed the medication.  Stopping may increase your risk of developing a new blood clot.  Refill your prescription before you run out.  After discharge, you should have regular check-up appointments with your healthcare provider that is prescribing your Eliquis.    What do you do if you miss a dose? If a dose of ELIQUIS is not taken at the scheduled time, take it as soon as possible on the same day and twice-daily administration should be resumed. The dose should not be doubled to make up for a missed dose.  Important Safety Information A possible side effect of Eliquis is bleeding. You should call your healthcare provider right away if you experience any of the following: Bleeding from an injury or your nose that does not stop. Unusual colored urine (red or dark brown) or unusual colored stools (red or black). Unusual bruising for unknown reasons. A serious fall or if you hit your head (even if there is no bleeding).  Some medicines may interact with Eliquis and might increase your risk of bleeding or clotting while on Eliquis. To help avoid  this, consult your healthcare provider or pharmacist prior to using any new prescription or non-prescription medications, including herbals, vitamins, non-steroidal anti-inflammatory drugs (NSAIDs) and supplements.  This website has more information on Eliquis (apixaban): http://www.eliquis.com/eliquis/home

## 2021-05-28 NOTE — Plan of Care (Signed)

## 2021-05-28 NOTE — Plan of Care (Signed)
  Problem: Education: Goal: Knowledge of General Education information will improve Description: Including pain rating scale, medication(s)/side effects and non-pharmacologic comfort measures Outcome: Progressing   Problem: Health Behavior/Discharge Planning: Goal: Ability to manage health-related needs will improve Outcome: Progressing   Problem: Clinical Measurements: Goal: Ability to maintain clinical measurements within normal limits will improve Outcome: Progressing Goal: Will remain free from infection Outcome: Progressing Goal: Respiratory complications will improve Outcome: Progressing Goal: Cardiovascular complication will be avoided Outcome: Progressing   Problem: Activity: Goal: Risk for activity intolerance will decrease Outcome: Progressing   Problem: Skin Integrity: Goal: Risk for impaired skin integrity will decrease Outcome: Progressing

## 2021-05-29 ENCOUNTER — Encounter (HOSPITAL_COMMUNITY): Payer: Self-pay | Admitting: Emergency Medicine

## 2021-05-30 ENCOUNTER — Telehealth: Payer: Self-pay | Admitting: *Deleted

## 2021-05-30 DIAGNOSIS — R918 Other nonspecific abnormal finding of lung field: Secondary | ICD-10-CM

## 2021-05-30 NOTE — Telephone Encounter (Signed)
I received a message from [facility] NP patient needs to be set up to see Dr. Julien Nordmann. I called and spoke to Travis Salazar.  He verbalized understanding of appt

## 2021-06-11 ENCOUNTER — Inpatient Hospital Stay: Payer: Medicare Other

## 2021-06-11 ENCOUNTER — Telehealth: Payer: Self-pay | Admitting: *Deleted

## 2021-06-11 ENCOUNTER — Inpatient Hospital Stay: Payer: Medicare Other | Attending: Internal Medicine | Admitting: Internal Medicine

## 2021-06-11 ENCOUNTER — Telehealth: Payer: Self-pay | Admitting: Medical Oncology

## 2021-06-11 NOTE — Telephone Encounter (Signed)
Pt called the access line and stated to cancel his appt today because "he is running a fever".

## 2021-06-11 NOTE — Telephone Encounter (Signed)
Patient missed his appt with Dr. Julien Nordmann today. I called him but was unable to reach. I did leave vm message with my name and phone number to call.

## 2021-06-12 ENCOUNTER — Ambulatory Visit: Payer: Self-pay | Admitting: Family Medicine

## 2021-06-20 ENCOUNTER — Ambulatory Visit (INDEPENDENT_AMBULATORY_CARE_PROVIDER_SITE_OTHER): Payer: Medicare Other | Admitting: Family Medicine

## 2021-06-20 ENCOUNTER — Encounter: Payer: Self-pay | Admitting: Family Medicine

## 2021-06-20 ENCOUNTER — Other Ambulatory Visit: Payer: Self-pay

## 2021-06-20 VITALS — BP 155/81 | HR 84 | Temp 98.3°F | Resp 16 | Ht 68.0 in | Wt 209.0 lb

## 2021-06-20 DIAGNOSIS — I471 Supraventricular tachycardia: Secondary | ICD-10-CM | POA: Diagnosis not present

## 2021-06-20 DIAGNOSIS — I10 Essential (primary) hypertension: Secondary | ICD-10-CM

## 2021-06-20 DIAGNOSIS — I69354 Hemiplegia and hemiparesis following cerebral infarction affecting left non-dominant side: Secondary | ICD-10-CM | POA: Diagnosis not present

## 2021-06-20 DIAGNOSIS — E7800 Pure hypercholesterolemia, unspecified: Secondary | ICD-10-CM | POA: Diagnosis not present

## 2021-06-20 DIAGNOSIS — I700 Atherosclerosis of aorta: Secondary | ICD-10-CM

## 2021-06-20 DIAGNOSIS — R918 Other nonspecific abnormal finding of lung field: Secondary | ICD-10-CM

## 2021-06-20 DIAGNOSIS — I2699 Other pulmonary embolism without acute cor pulmonale: Secondary | ICD-10-CM | POA: Diagnosis not present

## 2021-06-20 NOTE — Progress Notes (Signed)
Established patient visit   Patient: Travis Salazar.   DOB: 11-06-1936   85 y.o. Male  MRN: [id number] Visit Date: 06/20/2021  Today's healthcare provider: [name], MD   Chief Complaint  Patient presents with   Hypertension   Subjective    HPI Patient comes in today for follow-up hospitalization for acute pulmonary embolism after DVT.  He now is 1 day undergoing work-up/biopsy for likely lung cancer as probable etiology for this PE.  He is now on Eliquis and doing well. Has left hemiparesis from his old stroke is now almost completely resolved. Follow up Hospitalization  Patient was admitted to [facility] on 05/27/2021 and discharged on 05/29/2021. He was treated for syncopal episode which revealed pulmonary embolism.  Treatment for this included started on Eliquis.  He reports good compliance with treatment. He reports this condition is improved.   Hypertension, follow-up  BP Readings from Last 3 Encounters:  06/20/21 (!) 155/81  05/28/21 131/71  02/19/21 (!) 169/90   Wt Readings from Last 3 Encounters:  06/20/21 209 lb (94.8 kg)  05/27/21 190 lb 14.7 oz (86.6 kg)  02/19/21 210 lb (95.3 kg)     He was last seen for hypertension 3 months ago.  BP at that visit was 169/90. Management since that visit includes increasing Hydralazine from 50mg  twice daily to 100mg  twice daily.  He reports good compliance with treatment. He is not having side effects.  He is following a Regular diet. He is not exercising. He does not smoke.  Use of agents associated with hypertension: none.   Outside blood pressures are checked occasionally. Symptoms: No chest pain No chest pressure  No palpitations No syncope  No dyspnea No orthopnea  No paroxysmal nocturnal dyspnea No lower extremity edema   Pertinent labs: Lab Results  Component Value Date   CHOL 187 08/29/2020   HDL 40 08/29/2020   LDLCALC 125 (H) 08/29/2020   TRIG 120 08/29/2020   CHOLHDL 4.7 08/29/2020    Lab Results  Component Value Date   NA 140 05/27/2021   K 3.7 05/27/2021   CREATININE 1.03 05/27/2021   GFRNONAA >60 05/27/2021   GFRAA 86 08/29/2020   GLUCOSE 134 (H) 05/27/2021     The ASCVD Risk score (Goff DC Jr., et al., 2013) failed to calculate for the following reasons:   The 2013 ASCVD risk score is only valid for ages 78 to 55   The patient has a prior MI or stroke diagnosis        Medications: Outpatient Medications Prior to Visit  Medication Sig   allopurinol (ZYLOPRIM) 300 MG tablet TAKE 1 TABLET BY MOUTH EVERY DAY   amLODipine (NORVASC) 10 MG tablet TAKE 1 TABLET BY MOUTH EVERY DAY   APIXABAN (ELIQUIS) VTE STARTER PACK (10MG  AND 5MG ) Take as directed on package: start with two-5mg  tablets twice daily for 7 days. On day 8, switch to one-5mg  tablet twice daily.   atorvastatin (LIPITOR) 40 MG tablet TAKE 1 TABLET (40 MG TOTAL) BY MOUTH DAILY AT 6 PM.   colchicine 0.6 MG tablet Take 1 tablet (0.6 mg total) by mouth as needed (gout).   hydrALAZINE (APRESOLINE) 100 MG tablet Take 1 tablet (100 mg total) by mouth 2 (two) times daily.   hydrochlorothiazide (HYDRODIURIL) 25 MG tablet Take 1 tablet (25 mg total) by mouth 2 (two) times daily.   ketorolac (ACULAR) 0.5 % ophthalmic solution Place 1 drop into both eyes daily.   [facility]  1-0.2 % SUSP Place 1 drop into both eyes 3 (three) times daily.   No facility-administered medications prior to visit.    Review of Systems      Objective    BP (!) 155/81   Pulse 84   Temp 98.3 F (36.8 C)   Resp 16   Ht 5\' 8"  (1.727 m)   Wt 209 lb (94.8 kg)   BMI 31.78 kg/m  BP Readings from Last 3 Encounters:  06/20/21 (!) 155/81  05/28/21 131/71  02/19/21 (!) 169/90   Wt Readings from Last 3 Encounters:  06/20/21 209 lb (94.8 kg)  05/27/21 190 lb 14.7 oz (86.6 kg)  02/19/21 210 lb (95.3 kg)       Physical Exam Vitals reviewed.  Constitutional:      Appearance: He is well-developed.  HENT:     Head: Normocephalic  and atraumatic.     Right Ear: External ear normal.     Left Ear: External ear normal.     Nose: Nose normal.  Eyes:     General: No scleral icterus.    Conjunctiva/sclera: Conjunctivae normal.  Neck:     Thyroid: No thyromegaly.  Cardiovascular:     Rate and Rhythm: Normal rate and regular rhythm.     Heart sounds: Normal heart sounds.  Pulmonary:     Effort: Pulmonary effort is normal.     Breath sounds: Normal breath sounds.  Abdominal:     Palpations: Abdomen is soft.  Musculoskeletal:     Right lower leg: No edema.     Left lower leg: No edema.  Skin:    General: Skin is warm and dry.  Neurological:     General: No focal deficit present.     Mental Status: He is alert and oriented to person, place, and time.  Psychiatric:        Mood and Affect: Mood normal.        Behavior: Behavior normal.        Thought Content: Thought content normal.        Judgment: Judgment normal.      No results found for any visits on 06/20/21.  Assessment & Plan     1. Acute pulmonary embolism without acute cor pulmonale, unspecified pulmonary embolism type (HCC) On Eliquis for PE.  Will consult pharmacy to try to help get him patient assistance for this.  2. Pulmonary mass Has appointment with oncology to try to get tissue diagnosis for treatment, patient now willing to get this done  3. Paroxysmal supraventricular tachycardia (HCC) Rate today of 84  4. Hemiparesis affecting left side as late effect of cerebrovascular accident [facility]) Symptoms almost completely resolved after CVA.  All risk factors treated - AMB Referral to [facility] Coordinaton  5. Atherosclerosis of aorta ([place]) All risk factors treated - AMB Referral to [facility] Coordinaton  6. Hypercholesterolemia On atorvastatin 40  7. Essential (primary) hypertension On amlodipine and HCTZ and hydralazine   No follow-ups on file.      I, [name], MD, have reviewed all documentation for this  visit. The documentation on 06/23/21 for the exam, diagnosis, procedures, and orders are all accurate and complete.    Narjis Mira Cranford Mon, MD  [facility] [phone] (phone) [phone] (fax)  [place]

## 2021-06-21 ENCOUNTER — Telehealth: Payer: Self-pay | Admitting: *Deleted

## 2021-06-21 NOTE — Chronic Care Management (AMB) (Signed)
  Chronic Care Management   Outreach Note  06/21/2021 Name: Travis Salazar. MRN: [id number] DOB: [birth year]  Travis Salazar. is a 85 y.o. year old male who is a primary care patient of [name], MD. I reached out to Travis Salazar. by phone today in response to a referral sent by Mr. Abdelaziz Westenberger Jr.'s PCP, Dr. Rosanna Randy.      An unsuccessful telephone outreach was attempted today. The patient was referred to the case management team for assistance with care management and care coordination.   Follow Up Plan: A HIPAA compliant phone message was left for the patient providing contact information and requesting a return call. The care management team will reach out to the patient again over the next 7 days.  If patient returns call to provider office, please advise to call [place] at [phone].  [place] Management

## 2021-06-23 ENCOUNTER — Other Ambulatory Visit: Payer: Self-pay | Admitting: Family Medicine

## 2021-06-23 NOTE — Telephone Encounter (Signed)
Discontinued on 02/19/21. Dose changed

## 2021-06-25 NOTE — Chronic Care Management (AMB) (Signed)
  Chronic Care Management   Note  06/25/2021 Name: Travis Salazar. MRN: [id number] DOB: Mar 24, 1936  Travis Salazar. is a 85 y.o. year old male who is a primary care patient of [name], MD. I reached out to Travis Salazar. by phone today in response to a referral sent by [name]'s PCP, [name], MD.      [name] was given information about Chronic Care Management services today including:  CCM service includes personalized support from designated clinical staff supervised by his physician, including individualized plan of care and coordination with other care providers 24/7 contact phone numbers for assistance for urgent and routine care needs. Service will only be billed when office clinical staff spend 20 minutes or more in a month to coordinate care. Only one practitioner may furnish and bill the service in a calendar month. The patient may stop CCM services at any time (effective at the end of the month) by phone call to the office staff. The patient will be responsible for cost sharing (co-pay) of up to 20% of the service fee (after annual deductible is met).  Patient agreed to services and verbal consent obtained.   Follow up plan: Telephone appointment with care management team member scheduled for:08/03/2021  Zaya Kessenich  Care Guide, Embedded Care Coordination [place]  Care Management

## 2021-06-28 ENCOUNTER — Telehealth: Payer: Self-pay

## 2021-06-28 NOTE — Chronic Care Management (AMB) (Signed)
    Chronic Care Management Pharmacy Assistant   Name: Travis Salazar.  MRN: [id number] DOB: 1936/05/16  Reason for Encounter: Patient Assistance Coordination   06/28/2021- Patient assistance application filled out for Eliquis with [place] Patient assistance program. Mailing application to patient, highlighting all areas for patient to sign, patient will need to return application back to PCP office to sign and fax.  Initial visit scheduled with CCM Pharmacist Junius Argyle, CPP on 08/03/2021 at 11 am.   SIG: Pattricia Boss, [place] Pharmacist Assistant [phone]

## 2021-08-01 ENCOUNTER — Telehealth: Payer: Self-pay

## 2021-08-01 NOTE — Progress Notes (Signed)
    Chronic Care Management Pharmacy Assistant   Name: Travis Salazar.  MRN: [id number] DOB: August 30, 1936  Chart Review for Clinical Pharmacist on 08/03/2021.  Conditions to be addressed/monitored: CAD, HTN, and CVA, Paroxysmal supraventricular tachycardia,Acute Pulmonary embolism without cor pulmonale,Emphysema lung,Hypercholesterolemia   Primary concerns for visit include: Overall health   Recent office visits:  06/20/2021 Dr.Fisher MD (PCP) AMB Referral to [facility] 02/19/2021 Dr.Fisher MD (PCP) Increase hydralazine from 50 mg twice daily to 100 mg twice daily  Recent consult visits:  No Recent [place] [facility] visits:  Medication Reconciliation was completed by comparing discharge summary, patient's EMR and Pharmacy list, and upon discussion with patient.  Admitted to the hospital on 05/27/2021 due to Acute pulmonary embolism. Discharge date was 05/28/2021. Discharged from [facility]?Medications Started at [facility] Discharge:?? -started Eliquis DVT/PE Starter Pack   Medication Changes at Hospital Discharge: -Changed None  Medications Discontinued at Hospital Discharge: -Stopped aspirin 325 MG EC tablet  Stop clopidogrel 75 MG tablet Stop lisinopril 40 MG tablet  Medications that remain the same after Hospital Discharge:??  -All other medications will remain the same.    Have you seen any other providers since your last visit? no Any changes in your medications or health? no Any side effects from any medications? no Do you have an symptoms or problems not managed by your medications? no Any concerns about your health right now? no Has your provider asked that you check blood pressure, blood sugar, or follow special diet at home? Yes,Patient states he has a blood pressure machine, and checks his blood pressure occasionally. Do you get any type of exercise on a regular basis? Yes,patient reports he walks daily. Can you  think of a goal you would like to reach for your health? N/A Do you have any problems getting your medications? no Is there anything that you would like to discuss during the appointment?   Overall health  Please bring medications and supplements to appointment  Medications: Outpatient Encounter Medications as of 08/01/2021  Medication Sig   allopurinol (ZYLOPRIM) 300 MG tablet TAKE 1 TABLET BY MOUTH EVERY DAY   amLODipine (NORVASC) 10 MG tablet TAKE 1 TABLET BY MOUTH EVERY DAY   APIXABAN (ELIQUIS) VTE STARTER PACK ('10MG'$  AND '5MG'$ ) Take as directed on package: start with two-'5mg'$  tablets twice daily for 7 days. On day 8, switch to one-'5mg'$  tablet twice daily.   atorvastatin (LIPITOR) 40 MG tablet TAKE 1 TABLET (40 MG TOTAL) BY MOUTH DAILY AT 6 PM.   colchicine 0.6 MG tablet Take 1 tablet (0.6 mg total) by mouth as needed (gout).   hydrALAZINE (APRESOLINE) 100 MG tablet Take 1 tablet (100 mg total) by mouth 2 (two) times daily.   hydrochlorothiazide (HYDRODIURIL) 25 MG tablet Take 1 tablet (25 mg total) by mouth 2 (two) times daily.   ketorolac (ACULAR) 0.5 % ophthalmic solution Place 1 drop into both eyes daily.   SIMBRINZA 1-0.2 % SUSP Place 1 drop into both eyes 3 (three) times daily.   No facility-administered encounter medications on file as of 08/01/2021.    Care Gaps: Tetanus Vaccine Shingrix Vaccine COVID-19 Vaccine Influenza Vaccine Star Rating Drugs: Atorvastatin 40 mg [facility] Medication fill gaps: Allopurinol  300 MG last filled 01/07/2021 90 day supply APIXABAN (ELIQUIS) VTE STARTER PACK ('10MG'$  AND '5MG'$ ) last filled 05/28/2021 30 day supply  [place] Pharmacist Assistant [phone]

## 2021-08-03 ENCOUNTER — Telehealth: Payer: Self-pay

## 2021-08-03 ENCOUNTER — Telehealth: Payer: Medicare Other

## 2021-08-03 NOTE — Telephone Encounter (Signed)
Copied from [place] [id number]. Topic: Appointment Scheduling - Scheduling Inquiry for Clinic >> Aug 03, 2021 12:49 PM Pawlus, Brayton Layman A wrote: Reason for CRM: Pt missed his PharmD Initial outreach phone call this morning at 11am, pt stated he is by his phone if the pharmacist is able to call him back, otherwise I informed him that they may have to reschedule the call. Best contact number [phone], Please advise.

## 2021-08-03 NOTE — Telephone Encounter (Signed)
Erline Levine, can you help me reschedule this patient's initial visit?   Thanks!

## 2021-08-03 NOTE — Progress Notes (Deleted)
Chronic Care Management Pharmacy Note  08/03/2021 Name:  Travis Salazar. MRN:  [id number] DOB:  [birth year]  Summary: ***  Recommendations/Changes made from today's visit: ***  Plan: ***   Subjective: Travis Salazar. is an 85 y.o. year old male who is a primary patient of [name], MD.  The CCM team was consulted for assistance with disease management and care coordination needs.    {CCMTELEPHONEFACETOFACE:21091510} for {CCMINITIALFOLLOWUPCHOICE:21091511} in response to provider referral for pharmacy case management and/or care coordination services.   Consent to Services:  {CCMCONSENTOPTIONS:25074}  Patient Care Team: [name], MD as PCP - General (Family Medicine) Jerline Pain, MD as PCP - Cardiology (Cardiology) Pa, [place] as Consulting Physician (Optometry) Bernardo Heater, Ronda Fairly, MD as Consulting Physician (Urology) Germaine Pomfret, [facility] (Pharmacist)  Recent office visits: 06/20/2021 Dr.Fisher MD (PCP) AMB Referral to [place] 02/19/2021 Dr.Fisher MD (PCP) Increase hydralazine from 50 mg twice daily to 100 mg twice daily  Recent consult visits: None in previous 6 months  Hospital visits: Medication Reconciliation was completed by comparing discharge summary, patient's EMR and Pharmacy list, and upon discussion with patient.   Admitted to the hospital on 05/27/2021 due to Acute pulmonary embolism. Discharge date was 05/28/2021. Discharged from [place]?Medications Started at [facility] Discharge:?? -started Eliquis DVT/PE Starter Pack    Medication Changes at Hospital Discharge: -Changed None   Medications Discontinued at Hospital Discharge: -Stopped aspirin 325 MG EC tablet  Stop clopidogrel 75 MG tablet Stop lisinopril 40 MG tablet   Medications that remain the same after Hospital Discharge:??  -All other medications will remain the same.   Objective:  Lab Results   Component Value Date   CREATININE 1.03 05/27/2021   BUN 28 (H) 05/27/2021   GFRNONAA >60 05/27/2021   GFRAA 86 08/29/2020   NA 140 05/27/2021   K 3.7 05/27/2021   CALCIUM 9.6 05/27/2021   CO2 20 (L) 05/27/2021   GLUCOSE 134 (H) 05/27/2021    Lab Results  Component Value Date/Time   HGBA1C 5.9 (H) 08/29/2020 11:35 AM   HGBA1C 5.8 04/25/2020 10:17 AM   HGBA1C 6.0 (A) 01/24/2020 11:07 AM   HGBA1C 5.5 09/21/2019 11:44 AM    Last diabetic Eye exam: No results found for: HMDIABEYEEXA  Last diabetic Foot exam: No results found for: HMDIABFOOTEX   Lab Results  Component Value Date   CHOL 187 08/29/2020   HDL 40 08/29/2020   LDLCALC 125 (H) 08/29/2020   TRIG 120 08/29/2020   CHOLHDL 4.7 08/29/2020    Hepatic Function Latest Ref Rng & Units 05/27/2021 08/29/2020 01/07/2020  Total Protein 6.5 - 8.1 g/dL 6.5 7.2 7.1  Albumin 3.5 - 5.0 g/dL 3.4(L) 4.6 4.0  AST 15 - 41 U/L 33 21 19  ALT 0 - 44 U/L _0 Alk Phosphatase 38 - 126 U/L 42 64 57  Total Bilirubin 0.3 - 1.2 mg/dL 1.2 0.5 0.5    Lab Results  Component Value Date/Time   TSH 4.040 08/29/2020 11:35 AM   TSH 2.820 09/21/2019 11:44 AM    CBC Latest Ref Rng & Units 05/28/2021 05/27/2021 08/29/2020  WBC 4.0 - 10.5 K/uL 9.1 13.9(H) 6.7  Hemoglobin 13.0 - 17.0 g/dL 12.9(L) 13.7 14.3  Hematocrit 39.0 - 52.0 % 39.3 42.5 43.3  Platelets 150 - 400 K/uL 129(L) 128(L) 176    No results found for: VD25OH  Clinical ASCVD: Yes  The  ASCVD Risk score Mikey Bussing DC Jr., et al., 2013) failed to calculate for the following reasons:   The 2013 ASCVD risk score is only valid for ages 58 to 22   The patient has a prior MI or stroke diagnosis    Depression screen [facility] 2/9 06/20/2021 04/17/2020 08/04/2019  Decreased Interest 0 0 0  Down, Depressed, Hopeless 0 0 0  PHQ - 2 Score 0 0 0  Altered sleeping - - -  Tired, decreased energy - - -  Change in appetite - - -  Feeling bad or failure about yourself  - - -  Trouble concentrating - - -   Moving slowly or fidgety/restless - - -  Suicidal thoughts - - -  PHQ-9 Score - - -  Difficult doing work/chores - - -     ***Other: (CHADS2VASc if Afib, MMRC or CAT for COPD, ACT, DEXA)  Social History   Tobacco Use  Smoking Status Former   Packs/day: 1.00   Years: 50.00   Pack years: 50.00   Types: Cigarettes   Quit date: 12/15/2009   Years since quitting: 11.6  Smokeless Tobacco Never   BP Readings from Last 3 Encounters:  06/20/21 (!) 155/81  05/28/21 131/71  02/19/21 (!) 169/90   Pulse Readings from Last 3 Encounters:  06/20/21 84  05/28/21 83  02/19/21 79   Wt Readings from Last 3 Encounters:  06/20/21 209 lb (94.8 kg)  05/27/21 190 lb 14.7 oz (86.6 kg)  02/19/21 210 lb (95.3 kg)   BMI Readings from Last 3 Encounters:  06/20/21 31.78 kg/m  05/27/21 29.03 kg/m  02/19/21 31.93 kg/m    Assessment/Interventions: Review of patient past medical history, allergies, medications, health status, including review of consultants reports, laboratory and other test data, was performed as part of comprehensive evaluation and provision of chronic care management services.   SDOH:  (Social Determinants of Health) assessments and interventions performed: {yes/no:20286}  SDOH Screenings   Alcohol Screen: Not on file  Depression (PHQ2-9): Low Risk    PHQ-2 Score: 0  Financial Resource Strain: Not on file  Food Insecurity: Not on file  Housing: Not on file  Physical Activity: Not on file  Social Connections: Not on file  Stress: Not on file  Tobacco Use: Medium Risk   Smoking Tobacco Use: Former   Smokeless Tobacco Use: Never  Transportation Needs: Not on file    [place]  No Known Allergies  Medications Reviewed Today     Reviewed by Herbert Spires, Samantha Crimes, CPhT (Pharmacy Technician) on 05/27/21 at [facility] List Status: Complete   Medication Order Taking? Sig Documenting Provider Last Dose Status Informant  allopurinol (ZYLOPRIM) 300 MG tablet  [id number] Yes TAKE 1 TABLET BY MOUTH EVERY DAY  Patient taking differently: Take 300 mg by mouth daily.   [name], MD 05/26/2021 Active Self  amLODipine (NORVASC) 10 MG tablet [id number] Yes TAKE 1 TABLET BY MOUTH EVERY DAY  Patient taking differently: Take 10 mg by mouth daily.   [name], MD 05/26/2021 Active Self  aspirin EC 325 MG EC tablet [id number] Yes Take 1 tablet (325 mg total) by mouth daily. Mercy Riding, MD 05/26/2021 Active Self  atorvastatin (LIPITOR) 40 MG tablet [id number] Yes TAKE 1 TABLET (40 MG TOTAL) BY MOUTH DAILY AT 6 PM. [name], MD 05/26/2021 Active Self  clopidogrel (PLAVIX) 75 MG tablet [id number] Yes TAKE 1 TABLET BY MOUTH EVERY DAY  Patient taking  differently: Take 75 mg by mouth daily.   [name], MD 05/26/2021 1030 Active Self  colchicine 0.6 MG tablet [id number] Yes Take 1 tablet (0.6 mg total) by mouth daily.  Patient taking differently: Take 0.6 mg by mouth as needed (gout).   [name], MD unknown Active Self  hydrALAZINE (APRESOLINE) 100 MG tablet [id number] Yes Take 1 tablet (100 mg total) by mouth 2 (two) times daily. [name], MD 05/26/2021 Active Self  hydrochlorothiazide (HYDRODIURIL) 25 MG tablet [id number] Yes Take 1 tablet (25 mg total) by mouth 2 (two) times daily. [name], MD 05/26/2021 Active Self  ketorolac (ACULAR) 0.5 % ophthalmic solution [id number] Yes Place 1 drop into both eyes daily. [provider] 05/25/2021 Active Self  lisinopril (ZESTRIL) 40 MG tablet [id number] No Take 1 tablet (40 mg total) by mouth daily.  Patient not taking: No sig reported   [name], MD Not Taking Consider Medication Status and Discontinue Self  SIMBRINZA 1-0.2 % SUSP [id number] Yes Place 1 drop into both eyes 3 (three) times daily. [provider] 05/25/2021 Active Self            Patient Active Problem List   Diagnosis Date Noted   Acute  pulmonary embolism without acute cor pulmonale ([place]) 05/27/2021   Demand ischemia ([place]) 05/27/2021   CAD (coronary artery disease) 05/27/2021   Emphysema lung ([place]) 05/27/2021   Acute left hemiparesis ([place]) 04/30/2020   Paroxysmal supraventricular tachycardia ([place]) 04/30/2020   Pulmonary mass 01/07/2020   CVA (cerebral vascular accident) ([place]) 06/26/2019   Wrist swelling, right 06/03/2017   S/P prostatectomy 10/08/2016   [place]ores 10/08/2016   Essential (primary) hypertension 09/27/2015   History of tobacco use 09/27/2015   Hypercholesterolemia 09/27/2015   Blood glucose elevated 09/27/2015   Adiposity 09/27/2015    Immunization History  Administered Date(s) Administered   Influenza-Unspecified 10/16/2018   PFIZER(Purple Top)SARS-COV-2 Vaccination 12/18/2020   Pneumococcal Conjugate-13 10/04/2014   Pneumococcal Polysaccharide-23 01/11/2013   Zoster, Live 11/13/2012    Conditions to be addressed/monitored:  Hypertension, Hyperlipidemia, Coronary Artery Disease, Gout, and History of CVA, History of PE   There are no care plans that you recently modified to display for this patient.    Medication Assistance: {MEDASSISTANCEINFO:25044}  Compliance/Adherence/Medication fill history: Care Gaps: ***  Star-Rating Drugs: ***  Patient's preferred pharmacy is:  [id number] [place] [address]Phone: [phone]: [phone] [facility] [address]Phone: [phone]: [phone] Uses pill box? {Yes or If no, why not?:20788} Pt endorses ***% compliance  We discussed: {Pharmacy options:24294} Patient decided to: {US Pharmacy Plan:23885}  Care Plan and Follow Up Patient Decision:  {FOLLOWUP:24991}  Plan: {CM FOLLOW UP PLXBW:62035} ***  Current Barriers:  {pharmacybarriers:24917}  Pharmacist Clinical Goal(s):  Patient will  {PHARMACYGOALCHOICES:24921} through collaboration with PharmD and provider.   Interventions: 1:1 collaboration with GJerrol Banana, MD regarding development and update of comprehensive plan of care as evidenced by provider attestation and co-signature Inter-disciplinary care team collaboration (see longitudinal plan of care) Comprehensive medication review performed; medication list updated in electronic medical record  Hypertension (BP goal {CHL HP UPSTREAM Pharmacist BP ranges:9128399182}) -{US controlled/uncontrolled:25276} -Current treatment: Amlodipine 10 mg daily  Hydralazine 100 mg twice daily  HCTZ 25 mg twice daily  -Medications previously tried: ***  -Current home readings: *** -Current dietary habits: *** -  Current exercise habits: *** -{ACTIONS;DENIES/REPORTS:21021675::"Denies"} hypotensive/hypertensive symptoms -Educated on {CCM BP Counseling:25124} -Counseled to monitor BP at home ***, document, and provide log at future appointments -{CCMPHARMDINTERVENTION:25122}  Hyperlipidemia: (LDL goal < ***) -{US controlled/uncontrolled:25276} -Current treatment: Atorvastatin 40 mg daily -Medications previously tried: ***  -Current dietary patterns: *** -Current exercise habits: *** -Educated on {CCM HLD Counseling:25126} -{CCMPHARMDINTERVENTION:25122}  History of Pulmonary Embolism (Goal: prevent clots) -{US controlled/uncontrolled:25276} -Current treatment: Eliquis 5 mg twice daily  -Medications previously tried: *** -Counseled on avoidance of NSAIDs due to increased bleeding risk with anticoagulants; -Recommended to continue current medication  Gout (Goal: Prevent gout flares) -{US controlled/uncontrolled:25276} -Last Gout Flare: *** -Current treatment  Allopurinol 300 mg daily  Colchicine 0.6 mg daily PRN  -Medications previously tried: ***  -We discussed:  {gout counseling:24106} -{CCMPHARMDINTERVENTION:25122}   Patient Goals/Self-Care  Activities Patient will:  - {pharmacypatientgoals:24919}  Follow Up Plan: {CM FOLLOW UP KKDP:94707}

## 2021-08-13 ENCOUNTER — Emergency Department (HOSPITAL_COMMUNITY): Payer: Medicare Other

## 2021-08-13 ENCOUNTER — Inpatient Hospital Stay (HOSPITAL_COMMUNITY): Payer: Medicare Other

## 2021-08-13 ENCOUNTER — Telehealth: Payer: Self-pay | Admitting: *Deleted

## 2021-08-13 ENCOUNTER — Other Ambulatory Visit: Payer: Self-pay

## 2021-08-13 ENCOUNTER — Inpatient Hospital Stay (HOSPITAL_COMMUNITY)
Admission: EM | Admit: 2021-08-13 | Discharge: 2021-08-23 | DRG: 100 | Disposition: A | Discharge status: Home Health | Payer: Medicare Other | Attending: Family Medicine | Admitting: Family Medicine

## 2021-08-13 DIAGNOSIS — G9341 Metabolic encephalopathy: Secondary | ICD-10-CM | POA: Diagnosis present

## 2021-08-13 DIAGNOSIS — E7800 Pure hypercholesterolemia, unspecified: Secondary | ICD-10-CM | POA: Diagnosis present

## 2021-08-13 DIAGNOSIS — Z86711 Personal history of pulmonary embolism: Secondary | ICD-10-CM

## 2021-08-13 DIAGNOSIS — Z743 Need for continuous supervision: Secondary | ICD-10-CM | POA: Diagnosis not present

## 2021-08-13 DIAGNOSIS — Z1612 Extended spectrum beta lactamase (ESBL) resistance: Secondary | ICD-10-CM | POA: Diagnosis present

## 2021-08-13 DIAGNOSIS — R338 Other retention of urine: Secondary | ICD-10-CM | POA: Diagnosis not present

## 2021-08-13 DIAGNOSIS — N390 Urinary tract infection, site not specified: Secondary | ICD-10-CM | POA: Diagnosis not present

## 2021-08-13 DIAGNOSIS — J9690 Respiratory failure, unspecified, unspecified whether with hypoxia or hypercapnia: Secondary | ICD-10-CM | POA: Diagnosis not present

## 2021-08-13 DIAGNOSIS — J9601 Acute respiratory failure with hypoxia: Secondary | ICD-10-CM | POA: Diagnosis not present

## 2021-08-13 DIAGNOSIS — Z20822 Contact with and (suspected) exposure to covid-19: Secondary | ICD-10-CM | POA: Diagnosis not present

## 2021-08-13 DIAGNOSIS — R4781 Slurred speech: Secondary | ICD-10-CM | POA: Diagnosis not present

## 2021-08-13 DIAGNOSIS — R531 Weakness: Secondary | ICD-10-CM | POA: Diagnosis not present

## 2021-08-13 DIAGNOSIS — I10 Essential (primary) hypertension: Secondary | ICD-10-CM | POA: Diagnosis not present

## 2021-08-13 DIAGNOSIS — R918 Other nonspecific abnormal finding of lung field: Secondary | ICD-10-CM | POA: Diagnosis present

## 2021-08-13 DIAGNOSIS — G40901 Epilepsy, unspecified, not intractable, with status epilepticus: Principal | ICD-10-CM | POA: Diagnosis present

## 2021-08-13 DIAGNOSIS — Z87891 Personal history of nicotine dependence: Secondary | ICD-10-CM | POA: Diagnosis not present

## 2021-08-13 DIAGNOSIS — I672 Cerebral atherosclerosis: Secondary | ICD-10-CM | POA: Diagnosis not present

## 2021-08-13 DIAGNOSIS — I639 Cerebral infarction, unspecified: Secondary | ICD-10-CM | POA: Diagnosis not present

## 2021-08-13 DIAGNOSIS — Z8249 Family history of ischemic heart disease and other diseases of the circulatory system: Secondary | ICD-10-CM | POA: Diagnosis not present

## 2021-08-13 DIAGNOSIS — Z8673 Personal history of transient ischemic attack (TIA), and cerebral infarction without residual deficits: Secondary | ICD-10-CM

## 2021-08-13 DIAGNOSIS — J341 Cyst and mucocele of nose and nasal sinus: Secondary | ICD-10-CM | POA: Diagnosis not present

## 2021-08-13 DIAGNOSIS — R29818 Other symptoms and signs involving the nervous system: Secondary | ICD-10-CM | POA: Diagnosis not present

## 2021-08-13 DIAGNOSIS — H409 Unspecified glaucoma: Secondary | ICD-10-CM | POA: Diagnosis present

## 2021-08-13 DIAGNOSIS — Z7901 Long term (current) use of anticoagulants: Secondary | ICD-10-CM | POA: Diagnosis not present

## 2021-08-13 DIAGNOSIS — Z79899 Other long term (current) drug therapy: Secondary | ICD-10-CM

## 2021-08-13 DIAGNOSIS — J9602 Acute respiratory failure with hypercapnia: Secondary | ICD-10-CM | POA: Diagnosis not present

## 2021-08-13 DIAGNOSIS — M109 Gout, unspecified: Secondary | ICD-10-CM | POA: Diagnosis present

## 2021-08-13 DIAGNOSIS — Z9289 Personal history of other medical treatment: Secondary | ICD-10-CM

## 2021-08-13 DIAGNOSIS — J3489 Other specified disorders of nose and nasal sinuses: Secondary | ICD-10-CM | POA: Diagnosis not present

## 2021-08-13 DIAGNOSIS — R569 Unspecified convulsions: Secondary | ICD-10-CM | POA: Diagnosis not present

## 2021-08-13 DIAGNOSIS — R0902 Hypoxemia: Secondary | ICD-10-CM | POA: Diagnosis not present

## 2021-08-13 DIAGNOSIS — R6889 Other general symptoms and signs: Secondary | ICD-10-CM | POA: Diagnosis not present

## 2021-08-13 DIAGNOSIS — G8190 Hemiplegia, unspecified affecting unspecified side: Secondary | ICD-10-CM | POA: Diagnosis not present

## 2021-08-13 DIAGNOSIS — Z8546 Personal history of malignant neoplasm of prostate: Secondary | ICD-10-CM | POA: Diagnosis not present

## 2021-08-13 DIAGNOSIS — Z4682 Encounter for fitting and adjustment of non-vascular catheter: Secondary | ICD-10-CM | POA: Diagnosis not present

## 2021-08-13 LAB — RESP PANEL BY RT-PCR (FLU A&B, COVID) ARPGX2
Influenza A by PCR: NEGATIVE
Influenza B by PCR: NEGATIVE
SARS Coronavirus 2 by RT PCR: NEGATIVE

## 2021-08-13 LAB — PHENYTOIN LEVEL, TOTAL: Phenytoin Lvl: 16.6 ug/mL (ref 10.0–20.0)

## 2021-08-13 LAB — CBC
HCT: 43.8 % (ref 39.0–52.0)
Hemoglobin: 13.7 g/dL (ref 13.0–17.0)
MCH: 29.9 pg (ref 26.0–34.0)
MCHC: 31.3 g/dL (ref 30.0–36.0)
MCV: 95.6 fL (ref 80.0–100.0)
Platelets: 168 10*3/uL (ref 150–400)
RBC: 4.58 MIL/uL (ref 4.22–5.81)
RDW: 14.3 % (ref 11.5–15.5)
WBC: 11.4 10*3/uL — ABNORMAL HIGH (ref 4.0–10.5)
nRBC: 0 % (ref 0.0–0.2)

## 2021-08-13 LAB — I-STAT CHEM 8, ED
BUN: 22 mg/dL (ref 8–23)
Calcium, Ion: 1.28 mmol/L (ref 1.15–1.40)
Chloride: 103 mmol/L (ref 98–111)
Creatinine, Ser: 0.8 mg/dL (ref 0.61–1.24)
Glucose, Bld: 130 mg/dL — ABNORMAL HIGH (ref 70–99)
HCT: 44 % (ref 39.0–52.0)
Hemoglobin: 15 g/dL (ref 13.0–17.0)
Potassium: 3.7 mmol/L (ref 3.5–5.1)
Sodium: 140 mmol/L (ref 135–145)
TCO2: 29 mmol/L (ref 22–32)

## 2021-08-13 LAB — PROTIME-INR
INR: 1 (ref 0.8–1.2)
Prothrombin Time: 13.6 seconds (ref 11.4–15.2)

## 2021-08-13 LAB — DIFFERENTIAL
Abs Immature Granulocytes: 0.03 10*3/uL (ref 0.00–0.07)
Basophils Absolute: 0.1 10*3/uL (ref 0.0–0.1)
Basophils Relative: 0 %
Eosinophils Absolute: 0.1 10*3/uL (ref 0.0–0.5)
Eosinophils Relative: 1 %
Immature Granulocytes: 0 %
Lymphocytes Relative: 24 %
Lymphs Abs: 2.8 10*3/uL (ref 0.7–4.0)
Monocytes Absolute: 0.6 10*3/uL (ref 0.1–1.0)
Monocytes Relative: 5 %
Neutro Abs: 7.8 10*3/uL — ABNORMAL HIGH (ref 1.7–7.7)
Neutrophils Relative %: 70 %

## 2021-08-13 LAB — COMPREHENSIVE METABOLIC PANEL
ALT: 13 U/L (ref 0–44)
AST: 22 U/L (ref 15–41)
Albumin: 3.8 g/dL (ref 3.5–5.0)
Alkaline Phosphatase: 46 U/L (ref 38–126)
Anion gap: 7 (ref 5–15)
BUN: 17 mg/dL (ref 8–23)
CO2: 27 mmol/L (ref 22–32)
Calcium: 9.6 mg/dL (ref 8.9–10.3)
Chloride: 104 mmol/L (ref 98–111)
Creatinine, Ser: 0.87 mg/dL (ref 0.61–1.24)
GFR, Estimated: >60 mL/min (ref >60)
Glucose, Bld: 134 mg/dL — ABNORMAL HIGH (ref 70–99)
Potassium: 3.3 mmol/L — ABNORMAL LOW (ref 3.5–5.1)
Sodium: 138 mmol/L (ref 135–145)
Total Bilirubin: 0.8 mg/dL (ref 0.3–1.2)
Total Protein: 7.1 g/dL (ref 6.5–8.1)

## 2021-08-13 LAB — ALBUMIN: Albumin: 3.6 g/dL (ref 3.5–5.0)

## 2021-08-13 LAB — AMMONIA: Ammonia: 25 umol/L (ref 9–35)

## 2021-08-13 LAB — ETHANOL: Alcohol, Ethyl (B): <10 mg/dL (ref <10)

## 2021-08-13 LAB — CBG MONITORING, ED: Glucose-Capillary: 127 mg/dL — ABNORMAL HIGH (ref 70–99)

## 2021-08-13 LAB — APTT: aPTT: 23 seconds — ABNORMAL LOW (ref 24–36)

## 2021-08-13 MED ORDER — POLYETHYLENE GLYCOL 3350 17 G PO PACK: 17.0000 g | PACK | Freq: Every day | ORAL | Status: DC | PRN

## 2021-08-13 MED ORDER — SODIUM CHLORIDE 0.9 % IV SOLN: 40.0000 mg/kg | Freq: Once | INTRAVENOUS | Status: DC

## 2021-08-13 MED ORDER — FENTANYL CITRATE PF 50 MCG/ML IJ SOSY
PREFILLED_SYRINGE | INTRAMUSCULAR | Status: AC
  Administered 2021-08-13: 50 ug
  Filled 2021-08-13: qty 2

## 2021-08-13 MED ORDER — FENTANYL CITRATE PF 50 MCG/ML IJ SOSY
50.0000 ug | PREFILLED_SYRINGE | Freq: Once | INTRAMUSCULAR | Status: AC
Start: 2021-08-13 — End: 2021-08-13
  Administered 2021-08-13: 50 ug via INTRAVENOUS
  Filled 2021-08-13: qty 1

## 2021-08-13 MED ORDER — ORAL CARE MOUTH RINSE
15.0000 mL | OROMUCOSAL | Status: DC
  Administered 2021-08-14 – 2021-08-15 (×13): 15 mL via OROMUCOSAL

## 2021-08-13 MED ORDER — FENTANYL CITRATE PF 50 MCG/ML IJ SOSY: 25.0000 ug | PREFILLED_SYRINGE | INTRAMUSCULAR | Status: DC | PRN

## 2021-08-13 MED ORDER — SODIUM CHLORIDE 0.9 % IV SOLN
1500.0000 mg | Freq: Once | INTRAVENOUS | Status: AC
  Administered 2021-08-13: 1500 mg via INTRAVENOUS
  Filled 2021-08-13: qty 30

## 2021-08-13 MED ORDER — PROPOFOL 1000 MG/100ML IV EMUL
0.0000 ug/kg/min | INTRAVENOUS | Status: DC
  Administered 2021-08-13: 50 ug/kg/min via INTRAVENOUS
  Administered 2021-08-13: 45 ug/kg/min via INTRAVENOUS
  Administered 2021-08-14: 40 ug/kg/min via INTRAVENOUS
  Filled 2021-08-13 (×3): qty 100

## 2021-08-13 MED ORDER — FAMOTIDINE 20 MG PO TABS
20.0000 mg | ORAL_TABLET | Freq: Two times a day (BID) | ORAL | Status: DC
  Administered 2021-08-13 – 2021-08-15 (×4): 20 mg
  Filled 2021-08-13 (×4): qty 1

## 2021-08-13 MED ORDER — CHLORHEXIDINE GLUCONATE CLOTH 2 % EX PADS
6.0000 | MEDICATED_PAD | Freq: Every day | CUTANEOUS | Status: DC
  Administered 2021-08-14 – 2021-08-23 (×8): 6 via TOPICAL

## 2021-08-13 MED ORDER — ETOMIDATE 2 MG/ML IV SOLN
INTRAVENOUS | Status: AC | PRN
  Administered 2021-08-13: 30 mg via INTRAVENOUS

## 2021-08-13 MED ORDER — LEVETIRACETAM IN NACL 500 MG/100ML IV SOLN: 500.0000 mg | Freq: Once | INTRAVENOUS | Status: DC

## 2021-08-13 MED ORDER — HYDRALAZINE HCL 20 MG/ML IJ SOLN: 10.0000 mg | INTRAMUSCULAR | Status: DC | PRN

## 2021-08-13 MED ORDER — LORAZEPAM 2 MG/ML IJ SOLN
2.0000 mg | Freq: Once | INTRAMUSCULAR | Status: AC
  Administered 2021-08-13: 2 mg via INTRAVENOUS

## 2021-08-13 MED ORDER — SODIUM CHLORIDE 0.9 % IV SOLN: 750.0000 mg | Freq: Two times a day (BID) | INTRAVENOUS | Status: DC

## 2021-08-13 MED ORDER — LEVETIRACETAM IN NACL 1500 MG/100ML IV SOLN: 1500.0000 mg | Freq: Once | INTRAVENOUS | Status: DC

## 2021-08-13 MED ORDER — LORAZEPAM 2 MG/ML IJ SOLN
1.0000 mg | Freq: Once | INTRAMUSCULAR | Status: AC
  Administered 2021-08-13: 1 mg via INTRAVENOUS

## 2021-08-13 MED ORDER — LORAZEPAM 2 MG/ML IJ SOLN
INTRAMUSCULAR | Status: AC
  Administered 2021-08-13: 2 mg
  Filled 2021-08-13: qty 1

## 2021-08-13 MED ORDER — FENTANYL CITRATE PF 50 MCG/ML IJ SOSY
25.0000 ug | PREFILLED_SYRINGE | INTRAMUSCULAR | Status: DC | PRN
  Administered 2021-08-13: 50 ug via INTRAVENOUS

## 2021-08-13 MED ORDER — DOCUSATE SODIUM 50 MG/5ML PO LIQD
100.0000 mg | Freq: Two times a day (BID) | ORAL | Status: DC
  Administered 2021-08-13 – 2021-08-15 (×4): 100 mg
  Filled 2021-08-13 (×4): qty 10

## 2021-08-13 MED ORDER — POLYETHYLENE GLYCOL 3350 17 G PO PACK
17.0000 g | PACK | Freq: Every day | ORAL | Status: DC
  Administered 2021-08-14 – 2021-08-15 (×2): 17 g
  Filled 2021-08-13 (×2): qty 1

## 2021-08-13 MED ORDER — CHLORHEXIDINE GLUCONATE 0.12% ORAL RINSE (MEDLINE KIT)
15.0000 mL | Freq: Two times a day (BID) | OROMUCOSAL | Status: DC
  Administered 2021-08-13 – 2021-08-16 (×6): 15 mL via OROMUCOSAL

## 2021-08-13 MED ORDER — FENTANYL CITRATE PF 50 MCG/ML IJ SOSY
50.0000 ug | PREFILLED_SYRINGE | Freq: Once | INTRAMUSCULAR | Status: AC
  Administered 2021-08-13: 50 ug via INTRAVENOUS

## 2021-08-13 MED ORDER — PHENYTOIN 125 MG/5ML PO SUSP
100.0000 mg | Freq: Three times a day (TID) | ORAL | Status: DC
  Administered 2021-08-13 – 2021-08-15 (×5): 100 mg
  Filled 2021-08-13 (×8): qty 4

## 2021-08-13 MED ORDER — DOCUSATE SODIUM 100 MG PO CAPS: 100.0000 mg | ORAL_CAPSULE | Freq: Two times a day (BID) | ORAL | Status: DC | PRN

## 2021-08-13 MED ORDER — PANTOPRAZOLE SODIUM 40 MG IV SOLR: 40.0000 mg | Freq: Every day | INTRAVENOUS | Status: DC

## 2021-08-13 MED ORDER — LORAZEPAM 2 MG/ML IJ SOLN
1.0000 mg | Freq: Once | INTRAMUSCULAR | Status: AC
  Administered 2021-08-13: 1 mg via INTRAVENOUS
  Filled 2021-08-13: qty 1

## 2021-08-13 NOTE — Progress Notes (Signed)
[facility] Progress Note Patient Name: Travis Salazar. DOB: 1936-10-10 MRN: [id number]   Date of Service  08/13/2021  HPI/Events of Note  Patient admitted with seizures, altered mental status, and acute respiratory failure (mainly due to inability to protect his airway), in the context of a prior history of large right MCA  territory CVA. Patient is intubated and mechanically ventilated.  eICU Interventions  New Patient Evaluation.        [name] 08/13/2021, 9:35 PM

## 2021-08-13 NOTE — ED Provider Notes (Signed)
[facility] EMERGENCY DEPARTMENT Provider Note   CSN: [id number] Arrival date & time: 08/13/21  1609  LEVEL 5 CAVEAT - ACUITY OF CONDITION  History Chief Complaint  Patient presents with   Code Stroke    Shahrukh Pasch. is a 85 y.o. male.  HPI 85 year old male presents with acute left sided weakness. Presents as an acute code stroke. Last seen normal around 1:45 pm. Now having left sided arm and face weakness. He is unable to provide significant history. At the ER bridge he is noted to have left facial twitching and left arm twitching.  Past Medical History:  Diagnosis Date   Hyperlipidemia    Hypertension    Stroke [facility])     Patient Active Problem List   Diagnosis Date Noted   Status epilepticus ([place]) 08/13/2021   Acute pulmonary embolism without acute cor pulmonale ([place]) 05/27/2021   Demand ischemia ([place]) 05/27/2021   CAD (coronary artery disease) 05/27/2021   Emphysema lung ([place]) 05/27/2021   Acute left hemiparesis (HCC) 04/30/2020   Paroxysmal supraventricular tachycardia ([place]) 04/30/2020   Pulmonary mass 01/07/2020   CVA (cerebral vascular accident) ([place]) 06/26/2019   Wrist swelling, right 06/03/2017   S/P prostatectomy 10/08/2016   Snores 10/08/2016   Essential (primary) hypertension 09/27/2015   History of tobacco use 09/27/2015   Hypercholesterolemia 09/27/2015   Blood glucose elevated 09/27/2015   Adiposity 09/27/2015    Past Surgical History:  Procedure Laterality Date   LIPOMA EXCISION     neck   PROSTATECTOMY     due to prostate cancer       Family History  Problem Relation Age of Onset   COPD Mother    Heart attack Father     Social History   Tobacco Use   Smoking status: Former    Packs/day: 1.00    Years: 50.00    Pack years: 50.00    Types: Cigarettes    Quit date: 12/15/2009    Years since quitting: 11.6   Smokeless tobacco: Never  Vaping Use   Vaping Use: Never used  Substance Use Topics   Alcohol use: No     Alcohol/week: 0.0 standard drinks   Drug use: No    Home Medications Prior to Admission medications   Medication Sig Start Date End Date Taking? Authorizing Provider  allopurinol (ZYLOPRIM) 300 MG tablet TAKE 1 TABLET BY MOUTH EVERY DAY 01/07/21   [name], MD  amLODipine (NORVASC) 10 MG tablet TAKE 1 TABLET BY MOUTH EVERY DAY 04/18/21   [name], MD  APIXABAN [facility]ne [place]) VTE STARTER PACK (10MG AND 5MG) Take as directed on package: start with two-59m tablets twice daily for 7 days. On day 8, switch to one-558mtablet twice daily. 05/28/21   GrPatrecia PourMD  atorvastatin (LIPITOR) 40 MG tablet TAKE 1 TABLET (40 MG TOTAL) BY MOUTH DAILY AT 6 PM. 11/11/20   GiJerrol Banana MD  colchicine 0.6 MG tablet Take 1 tablet (0.6 mg total) by mouth as needed (gout). 05/28/21   GrPatrecia PourMD  hydrALAZINE (APRESOLINE) 100 MG tablet Take 1 tablet (100 mg total) by mouth 2 (two) times daily. 02/19/21   GiJerrol Banana MD  hydrochlorothiazide (HYDRODIURIL) 25 MG tablet Take 1 tablet (25 mg total) by mouth 2 (two) times daily. 08/29/20   GiJerrol Banana MD  ketorolac (ACULAR) 0.5 % ophthalmic solution Place 1 drop into both eyes daily. 05/12/19  [provider]  SIMBRINZA 1-0.2 % SUSP Place 1 drop into both eyes 3 (three) times daily. 06/11/19   [provider]    Allergies    Patient has no known allergies.  Review of Systems   Review of Systems  Unable to perform ROS: Acuity of condition   Physical Exam Updated Vital Signs BP 138/87   Pulse 76   Temp 97.9 F (36.6 C) (Axillary)   Resp 16   Ht '5\' 8"'  (1.727 m)   Wt 90.2 kg   SpO2 100%   BMI 30.24 kg/m   Physical Exam Vitals and nursing note reviewed.  Constitutional:      Appearance: He is well-developed.  HENT:     Head: Normocephalic and atraumatic.     Right Ear: External ear normal.     Left Ear: External ear normal.     Nose: Nose normal.  Eyes:     General:         Right eye: No discharge.        Left eye: No discharge.  Cardiovascular:     Rate and Rhythm: Normal rate and regular rhythm.     Heart sounds: Murmur heard.  Pulmonary:     Effort: Pulmonary effort is normal.     Breath sounds: Normal breath sounds.  Abdominal:     Palpations: Abdomen is soft.     Tenderness: There is no abdominal tenderness.  Musculoskeletal:     Cervical back: Neck supple.  Skin:    General: Skin is warm and dry.  Neurological:     Mental Status: He is alert.     Comments: Awake, alert, follows commands. Normal strength RUE, RLE, LLE. Weakness in LUE but also has rhythmic twitching. Left facial twitching as well.  Psychiatric:        Mood and Affect: Mood is not anxious.    ED Results / Procedures / Treatments   Labs (all labs ordered are listed, but only abnormal results are displayed) Labs Reviewed  APTT - Abnormal; Notable for the following components:      Result Value   aPTT 23 (*)    All other components within normal limits  CBC - Abnormal; Notable for the following components:   WBC 11.4 (*)    All other components within normal limits  DIFFERENTIAL - Abnormal; Notable for the following components:   Neutro Abs 7.8 (*)    All other components within normal limits  COMPREHENSIVE METABOLIC PANEL - Abnormal; Notable for the following components:   Potassium 3.3 (*)    Glucose, Bld 134 (*)    All other components within normal limits  CBG MONITORING, ED - Abnormal; Notable for the following components:   Glucose-Capillary 127 (*)    All other components within normal limits  I-STAT CHEM 8, ED - Abnormal; Notable for the following components:   Glucose, Bld 130 (*)    All other components within normal limits  RESP PANEL BY RT-PCR (FLU A&B, COVID) ARPGX2  MRSA NEXT GEN BY PCR, NASAL  ETHANOL  PROTIME-INR  AMMONIA  PHENYTOIN LEVEL, TOTAL  ALBUMIN  RAPID URINE DRUG SCREEN, HOSP PERFORMED  URINALYSIS, ROUTINE W REFLEX MICROSCOPIC  CBC  BASIC  METABOLIC PANEL  MAGNESIUM  PHOSPHORUS  TRIGLYCERIDES  BLOOD GAS, VENOUS    EKG None  Radiology DG CHEST PORT 1 VIEW  Result Date: 08/13/2021 CLINICAL DATA:  Endotracheal tube placement EXAM: PORTABLE CHEST 1 VIEW COMPARISON:  08/13/2021 FINDINGS: Tip of the endotracheal  tube is at the level of the clavicular heads. Esophageal catheter is coiled within the stomach. Bibasilar opacities, likely mild interstitial pulmonary edema. Poorly visualized right upper lobe mass. IMPRESSION: Tip of endotracheal tube at the level of the clavicular heads. Esophageal catheter coiled within the stomach. Electronically Signed   By: Ulyses Jarred M.D.   On: 08/13/2021 19:37   DG CHEST PORT 1 VIEW  Result Date: 08/13/2021 CLINICAL DATA:  Acute respiratory failure with hypoxia. Code stroke. EXAM: PORTABLE CHEST 1 VIEW COMPARISON:  Chest x-ray 05/27/2021, CT chest 05/27/2021 FINDINGS: The heart and mediastinal contours are unchanged. Aortic calcification. Persistent elevation of left hemidiaphragm. Bibasilar streaky airspace opacities likely represent atelectasis. Redemonstration of a known right upper lobe pulmonary mass (measuring at least 2.6 cm) that is poorly evaluated on this radiograph. No focal consolidation. No pulmonary edema. No pleural effusion. No pneumothorax. No acute osseous abnormality. IMPRESSION: 1. No acute cardiopulmonary abnormality in a patient with known right upper lobe pulmonary mass. As mentioned on CT angiography 05/27/2021: Additional imaging evaluation or consultation with Pulmonology or Thoracic Surgery recommended if not already obtained. 2.  Aortic Atherosclerosis (ICD10-I70.0). Electronically Signed   By: Iven Finn M.D.   On: 08/13/2021 18:34   EEG adult  Result Date: 08/13/2021 [name], MD     08/13/2021  8:28 PM Patient Name: [name] MRN: [id number] Epilepsy Attending: [name] Referring Physician/Provider: Clance Boll, NP Date: 08/13/2021  Duration: 23.15 mins Patient history: 85 yo male who arrived as a code stroke but was actively seizing. EEG to evaluate for status epilepticus. Level of alertness:  lethargic AEDs during EEG study: Ativan Technical aspects: This EEG study was done with scalp electrodes positioned according to the 10-20 International system of electrode placement. Electrical activity was acquired at a sampling rate of '500Hz'  and reviewed with a high frequency filter of '70Hz'  and a low frequency filter of '1Hz' . EEG data were recorded continuously and digitally stored. Description: EEG showed continuous generalized and lateralized right hemisphere 3 to 6 Hz theta-delta slowing. Admixed with 15-'18hz'  generalized beta activity.Lateralized periodic discharges were noted in right hemisphere, maximal right temporal region with fluctuating frequency between 1-1.'5hz' .  Hyperventilation and photic stimulation were not performed.   ABNORMALITY - Lateralized periodic discharges, right hemisphere, maximal right temporal region - Continuous slow, generalized and lateralized right hemisphere IMPRESSION: This study howed evidence of epileptogenicity and cortical dysfunction arising from right hemisphere, maximal right temporal region likely secondary to underlying structural abnormality, seizure. Of note, lateralized periodic discharges are on the ictal-interictal continuum with increased potential for seizure recurrence. There is also moderate to severe diffuse encephalopathy, nonspecific etiology but likely related to seizure. No seizures were seen throughout the recording. [name]   CT HEAD CODE STROKE WO CONTRAST  Result Date: 08/13/2021 CLINICAL DATA:  Code stroke.  Neuro deficit, acute, stroke suspected EXAM: CT HEAD WITHOUT CONTRAST TECHNIQUE: Contiguous axial images were obtained from the base of the skull through the vertex without intravenous contrast. COMPARISON:  CT head May 27, 2021 FINDINGS: Brain: No evidence of acute large  vascular territory infarction, hemorrhage, hydrocephalus, extra-axial collection or mass lesion/mass effect. Similar remote right MCA territory infarcts with encephalomalacia. Mild atrophy. Vascular: No hyperdense vessel identified. Calcific intracranial atherosclerosis. Skull: No acute fracture. Sinuses/Orbits: Right maxillary sinus retention cyst. Mild paranasal sinus mucosal thickening. No acute orbital findings. Other: No mastoid effusions. ASPECTS [facility] Stroke Program Early CT Score) Total score (0-10 with 10 being normal): 10. IMPRESSION: 1. No  evidence of acute large vascular territory infarct or acute hemorrhage. ASPECTS is 10. 2. Large remote right MCA territory infarcts. Code stroke imaging results were communicated on 08/13/2021 at 4:31 pm to provider Dr. Quinn Axe Via secure text paging. Electronically Signed   By: Margaretha Sheffield M.D.   On: 08/13/2021 16:33    Procedures .Critical Care  Date/Time: 08/13/2021 11:45 PM Performed by: Sherwood Gambler, MD Authorized by: Sherwood Gambler, MD   Critical care provider statement:    Critical care time (minutes):  45   Critical care time was exclusive of:  Separately billable procedures and treating other patients   Critical care was necessary to treat or prevent imminent or life-threatening deterioration of the following conditions:  CNS failure or compromise   Critical care was time spent personally by me on the following activities:  Discussions with consultants, evaluation of patient's response to treatment, examination of patient, ordering and performing treatments and interventions, ordering and review of laboratory studies, ordering and review of radiographic studies, pulse oximetry, re-evaluation of patient's condition, obtaining history from patient or surrogate and review of old charts   Medications Ordered in ED Medications  docusate (COLACE) 50 MG/5ML liquid 100 mg (100 mg Per Tube Given 08/13/21 2259)  polyethylene glycol (MIRALAX /  GLYCOLAX) packet 17 g (has no administration in time range)  fentaNYL (SUBLIMAZE) injection 25 mcg (has no administration in time range)  fentaNYL (SUBLIMAZE) injection 25-100 mcg (50 mcg Intravenous Given 08/13/21 1845)  propofol (DIPRIVAN) 1000 MG/100ML infusion (50 mcg/kg/min  90.2 kg Intravenous New Bag/Given 08/13/21 2136)  hydrALAZINE (APRESOLINE) injection 10-20 mg (has no administration in time range)  famotidine (PEPCID) tablet 20 mg (20 mg Per Tube Given 08/13/21 2259)  phenytoin (DILANTIN) 125 MG/5ML suspension 100 mg (100 mg Per Tube Given 08/13/21 2259)  Chlorhexidine Gluconate Cloth 2 % PADS 6 each (has no administration in time range)  chlorhexidine gluconate (MEDLINE KIT) (PERIDEX) 0.12 % solution 15 mL (15 mLs Mouth Rinse Given 08/13/21 2317)  MEDLINE mouth rinse (has no administration in time range)  LORazepam (ATIVAN) 2 MG/ML injection (2 mg  Given 08/13/21 1640)  LORazepam (ATIVAN) injection 1 mg (1 mg Intravenous Given 08/13/21 1623)  LORazepam (ATIVAN) injection 1 mg (1 mg Intravenous Given 08/13/21 1615)  fosPHENYtoin (CEREBYX) 1,500 mg PE in sodium chloride 0.9 % 50 mL IVPB (0 mg PE Intravenous Stopped 08/13/21 1724)  LORazepam (ATIVAN) injection 2 mg (2 mg Intravenous Given 08/13/21 1623)  etomidate (AMIDATE) injection (30 mg Intravenous Given 08/13/21 1818)  fentaNYL (SUBLIMAZE) 50 MCG/ML injection (50 mcg  Given 08/13/21 1824)  fentaNYL (SUBLIMAZE) injection 50 mcg (50 mcg Intravenous Given 08/13/21 1823)    Followed by  fentaNYL (SUBLIMAZE) injection 50 mcg (50 mcg Intravenous Given 08/13/21 4098)    ED Course  I have reviewed the triage vital signs and the nursing notes.  Pertinent labs & imaging results that were available during my care of the patient were reviewed by me and considered in my medical decision making (see chart for details).    MDM Rules/Calculators/A&P                           Patient presents as a code stroke. However he is found to have status  which seems to be causing his acute presentation. Was given ativan (1 mg, then 1 mg, then 2) and fos-phenytoin. He is now lethargic, though still breathing. Mildly requiring oxygen. Watched him but he didn't seem to  awaken. Most of this seems to be from meds need to control seizures. ICU consulted for admission. Final Clinical Impression(s) / ED Diagnoses Final diagnoses:  Status epilepticus [facility])    Rx / DC Orders ED Discharge Orders     None        Sherwood Gambler, MD 08/13/21 2358

## 2021-08-13 NOTE — Progress Notes (Signed)
STAT LTM set up with MRI compatible leads. Dr. Hortense Ramal notified of STAT.

## 2021-08-13 NOTE — Procedures (Signed)
Intubation Procedure Note  Travis Salazar  [id number]  [birth year]  Date:08/13/21  Time:6:29 PM   Provider Performing:Brittnae Aschenbrenner W Heber Biehle    Procedure: Intubation ([id number])  Indication(s) Respiratory Failure  Consent Risks of the procedure as well as the alternatives and risks of each were explained to the patient and/or caregiver.  Consent for the procedure was obtained and is signed in the bedside chart   Anesthesia Etomidate '30mg'$    Time Out Verified patient identification, verified procedure, site/side was marked, verified correct patient position, special equipment/implants available, medications/allergies/relevant history reviewed, required imaging and test results available.   Sterile Technique Usual hand hygeine, masks, and gloves were used   Procedure Description Patient positioned in bed supine.  Sedation given as noted above.  Patient was intubated with endotracheal tube using Glidescope.  View was Grade 1 full glottis .  Number of attempts was 1.  Colorimetric CO2 detector was consistent with tracheal placement.   Complications/Tolerance None; patient tolerated the procedure well. Chest X-ray is ordered to verify placement.   EBL Minimal   Specimen(s) None   Georgann Housekeeper, AGACNP-BC [facility]  See Amion for personal pager PCCM on call pager [phone] until 7pm. Please call Elink 7p-7a. [phone]  08/13/2021 6:29 PM

## 2021-08-13 NOTE — ED Triage Notes (Signed)
Pt BIB [facility] from home and activated as a Code stroke. Family reported last known normal of 1345. At 1400 facial twitching to the L side in the mouth area, garbled speech, along with L sided weakness. Pt does have significant hx for prior stroke 2 months ago. Is on Eliquis and baby ASA daily. VS per EMS last BP: 195/106 SpO2- 93% RA. HR 90s, CBG 127. Pt arrival to CT 2 and still displaying twitching movement and initially given 1 mg of Ativan prior to dry head CT and then 1 mg of Ativan after CT performed. Patient also requiring new need of O2 as well as he became hypoxic with O2 sat of 85% on RA. Pt placed on 6 L of O2 per [place] with improvement. Pt brought to rm 28 with Dr. Quinn Axe at bedside evaluating.

## 2021-08-13 NOTE — Progress Notes (Signed)
EEG computer cart transported to 4N connected running online, all leads attached. LTM maint complete - no skin breakdown  [facility] monitored, Event button test confirmed by [facility].

## 2021-08-13 NOTE — Consult Note (Signed)
Neurology consult   CC: code stroke.  History is obtained from: EMS, chart.   HPI:  Travis Salazar, Travis Salazar. Is an 85 yo male with a PMHx of gout, stroke and 2020 and maybe 2022, PEs, HLD, prostrate cancer, and HTN. On stroke note here in 2020, patient was placed on Plavix and ASA. On admission 05/2021, DAPT was changed to Eliquis because of PEs.   Patient arrived at the ED bridge and it was noticed immediately that he had left sided facial twitching with involvement of his left arm more and more. Activity was rhythmical twitching low amplitude. Patient was taken emergently to CT suite where [facility] was negative for acute. NO CTA due to unsuspected LVO and more in favor of seizure. Patient denies any history of seizure. NP attempted to call family but did not answer phone. Wife was only phone # on chart.   Patient was given Ativan '1mg'$  IV. BP 180s with O2 sat 80s. O2 at 2L placed and O2 only rose to 88j, so 6L O2 was used. After which, he O2 sats were in the upper 90s-100s. BP down to 160s. Avitan '1mg'$  IV repeated and patient noted to continue to twitch in left face and left arm. Patient was taken back to ED bed and due to continued seizure like activity, an additional Ativan '2mg'$  IV was given. Fosphenytoin was ordered.   He was satting upper 90s after the total of '4mg'$  Ativan and O2 decreased to 3L with SaO2 continuing in the upper 90s. Patient has gag reflex with use of suction. Mouth breather. No acute respiratory distress with increased work of breathing.   NP does not see any other notes re: a second stroke in the chart. Only notes found are from stroke in 2000 which was right MCA. No notes in care everywhere re: stroke. Could not reach family to clarify. Elsewhere in chart, there is mention of lung nodule which was cancer. Patient refused treatment for radiation or surgery. Last hemoc note was one 12/2019 and patient was refusing treatment but agreed to f/up CT chest in 3 months. Patient was lost to f/up until  in the hospital in 05/2021. He wanted to speak to his family about next treatment steps.   +++NP finally able to get in touch with daughter who is driving in from [place]. She was not there to witness today's event. Daughter states that her father has never had seizures, but had a stroke in 2020. No stroke since. Daughter helps care for patient and wife. Wife is disabled on O2. Daughter handles finances. Other siblings live closer. Daughter states father has a living will but she is unsure as to what it says, but thinks it says to do everything. States her father is private and does not share a lot about his health. NP asked daughter if she would touch base with patient's wife about code status and let6 [place] know.   LKW: 1345 hours tpa given?: no, on Elqiuis IR Thrombectomy?: No, low suspicion for LVO.  [id number] to illicit from patient and not from family over the phone.   NIHSS:  1a Level of Consciousness: 0 1b LOC Questions: 0 1c LOC Commands: 0 2 Best Gaze: 1 3 Visual: 2 4 Facial Palsy: 1 5a Motor Arm - left: 2 5b Motor Arm - Right: 0 6a Motor Leg - Left: 0 6b Motor Leg - Right: 0 7 Limb Ataxia: 0 8 Sensory: 0 9 Best Language: 2 10 Dysarthria: 1 11 Extinction and Inattention: 0  TOTAL:  9  ROS: A robust ROS was unable to be performed due to emergent nature of event.   Past Medical History:  Diagnosis Date   Hyperlipidemia    Hypertension    Stroke [facility])     Family History  Problem Relation Age of Onset   COPD Mother    Heart attack Father    Social History:  reports that he quit smoking about 11 years ago. His smoking use included cigarettes. He has a 50.00 pack-year smoking history. He has never used smokeless tobacco. He reports that he does not drink alcohol and does not use drugs.   Prior to Admission medications   Medication Sig Start Date End Date Taking? Authorizing Provider  allopurinol (ZYLOPRIM) 300 MG tablet TAKE 1 TABLET BY MOUTH EVERY DAY 01/07/21    [name], MD  amLODipine (NORVASC) 10 MG tablet TAKE 1 TABLET BY MOUTH EVERY DAY 04/18/21   [name], MD  APIXABAN Arne Cleveland) VTE STARTER PACK ('10MG'$  AND '5MG'$ ) Take as directed on package: start with two-'5mg'$  tablets twice daily for 7 days. On day 8, switch to one-'5mg'$  tablet twice daily. 05/28/21   Patrecia Pour, MD  atorvastatin (LIPITOR) 40 MG tablet TAKE 1 TABLET (40 MG TOTAL) BY MOUTH DAILY AT 6 PM. 11/11/20   [name], MD  colchicine 0.6 MG tablet Take 1 tablet (0.6 mg total) by mouth as needed (gout). 05/28/21   Patrecia Pour, MD  hydrALAZINE (APRESOLINE) 100 MG tablet Take 1 tablet (100 mg total) by mouth 2 (two) times daily. 02/19/21   [name], MD  hydrochlorothiazide (HYDRODIURIL) 25 MG tablet Take 1 tablet (25 mg total) by mouth 2 (two) times daily. 08/29/20   [name], MD  ketorolac (ACULAR) 0.5 % ophthalmic solution Place 1 drop into both eyes daily. 05/12/19   [provider]  SIMBRINZA 1-0.2 % SUSP Place 1 drop into both eyes 3 (three) times daily. 06/11/19   [provider]    Exam: Current vital signs: Wt 90.2 kg   BMI 30.24 kg/m   Physical Exam  Constitutional: Chronically ill appearing male in mild distress.   Psych: Affect appropriate to situation. Eyes: No scleral injection. HENT: No OP obstruction. Head: Normocephalic.  Cardiovascular: Normal rate and regular rhythm.  Respiratory: Effort normal.  GI: Abdomen soft.  No distension. There is no tenderness.  Skin: WDI.  Neuro: Mental Status: Patient is awake, alert. Answers simple questions. Knows his name and age. Knows the month. No further orientation assessed prior to patient getting Ativan.  Patient is unable to give a clear and coherent history. No signs of neglect. Speech/Language:  Speech is dysarthric and aphasic.   Cranial Nerves: II: Prefers left gaze. Unable to cross midline on first exam, but does later. Pupils are equal,  round, and reactive to light.  III,IV, VI: No ptosis.    V: Facial sensation is symmetric to light touch in V1, V2, and V3 VII: Facial movement symmetrical. Droop left facial droop.  VIII: hearing is intact to voice. X: Uvula elevates symmetrically. XI: Shoulder shrug is symmetric. XII: tongue is midline without atrophy or fasciculations.  Motor: He is unable to grip with left hand due to seizure activity. On arrival, he could lift his left arm without drift, but as seizure activity increased, he was unable to move left arm at all. He can lift RUE without drift. He is able to lift BLEs without drift  on first exam, weaker later.  Sensory: Sensation is symmetric to light touch in all fours extremities. Extinction absent to light touch DSS.  Plantars: Toes are downgoing bilaterally.  Cerebellar: Rhythmical twitching noted to left face and left arm. Left arm becomes more involved with time.   +++after patient back in ED, he was unresponsive post Ativan '4mg'$  total. Fosphenytoin infusing. O2 sats on 3L per [place] in the high 80s. Mouth breathing. HOB up 45-60 degrees. Was gurgling and suctioned with Y[place]auer with positive gag reflex. Not speaking, responding, or following commands after Ativan.   CTH:  No evidence of acute large vascular territory infarct or acute hemorrhage. ASPECTS is 10. Large remote right MCA territory infarcts.  Stat EEG:  Right PLEDS at 1 hx with some evolution in frequency but no definite seizure. Per Dr. Hortense Ramal read.   Assessment: 85 yo male who arrived as a code stroke but was actively seizing. No personal history of seizures. CTH negative for acute. Seizures were aborted with above treatments. Stat EEG as above, will continue overnight with video and f/up tomorrow. Because stroke could still be a possibility, we will get a MRI brain when able. He will need complete stroke w/up if MRI brain is + for infarct. Will need to r/o metastatic disease to brain, stroke and bleed  before restarting his home Eliquis for PEs found in June 2022.   Impression:  -Status epilepticus, aborted. On continuous EEG with no seizures currently -stroke symptoms, r/o stroke with MRI brain when able.  -Lung cancer, lost to f/up. Nodule was larger in June, but patient did not go for serial CTs or f/up with hemoc.   Plan: - Medicine. PCCM consult for airway management.  -ICU overnight.  - MRI brain with and without contrast when able. He has MRI compatible EEG leads on.  -Stat EEG followed by overnight with video.  -CTA head and neck if MRI + for stroke. -NIH per stroke protocol.  -frequent neuro checks.  -Telemetry.  -NPO.  -Hold Eliquis for now (see assessment).  - S/p fosphytoin load, pharmacy consulted to manage maintenance dose  If MRI brain is positive for stroke, will need:  - Recommend TTE. - Recommend labs: HbA1c, lipid panel, TSH. - Recommend Statin or increased dose if LDL > 70 - Hold Eliquis for now.  -ASA '81mg'$  po qd.  - SBP goal - Permissive hypertension first 24 h < 220/110 If MRI is + stroke. If negative, goal BP 120-140 given atherosclerosis on CTA head and neck in 2020.  - Telemetry monitoring for arrhythmia. - bedside Swallow screen. - Stroke education. - PT/OT/SLP consult.  Patient seen by Clance Boll, MSN, APN-BC, nurse practitioner and by MD. Note/plan to be edited by MD as needed.  Pager: [phone]  Neurology Attending Attestation   I examined the patient and discussed plan with Ms. Lionel December. Above note has been edited by me to reflect my findings and recommendations. I was present throughout the stroke code and made all significant decisions and personally reviewed CNS imaging and also discussed CTA results with radiologist by phone.    This patient is critically ill and at significant risk of neurological worsening, death and care requires constant monitoring of vital signs, hemodynamics,respiratory and cardiac monitoring,  neurological assessment, discussion with family, other specialists and medical decision making of high complexity. I spent 95 minutes of neurocritical care time  in the care of  this patient. This was time spent independent of any time provided by nurse  practitioner or PA.   Su Monks, MD [facility] [phone]   If 7pm- 7am, please page neurology on call as listed in [place].

## 2021-08-13 NOTE — ED Notes (Signed)
Travis Salazar [phone], Travis Salazar [phone] daughters of pt who can recevie update on pt's status.

## 2021-08-13 NOTE — ED Notes (Signed)
Hoffman NP, Dr. Verlee Monte, and Marlyne Beards RN at bedside performing intubation at this time.

## 2021-08-13 NOTE — Chronic Care Management (AMB) (Signed)
  Care Management   Note  08/13/2021 Name: [name]. MRN: [id number] DOB: 08-17-1936  Travis Salazar. is a 85 y.o. year old male who is a primary care patient of [name], MD and is actively engaged with the care management team. I reached out to [facility]. by phone today to assist with re-scheduling an initial visit with the Pharmacist  Follow up plan: Unsuccessful telephone outreach attempt made. A HIPAA compliant phone message was left for the patient providing contact information and requesting a return call.  The care management team will reach out to the patient again over the next 7 days.  If patient returns call to provider office, please advise to call [place] at [phone].  [place] Management  Direct Dial: [phone]

## 2021-08-13 NOTE — Progress Notes (Signed)
[place] Progress Note Patient Name: Travis Salazar. DOB: 1936/06/05 MRN: [id number]   Date of Service  08/13/2021  HPI/Events of Note  CXR reviewed, ETT slightly below clavicular heads.  eICU Interventions  RT requested to advance ETT 1 cm.        Frederik Pear 08/13/2021, 11:43 PM

## 2021-08-13 NOTE — Code Documentation (Signed)
Stroke Response Nurse Documentation Code Documentation  Travis Salazar. is a 85 y.o. male arriving to [facility] ED via [facility] on 08/13/2021 with past medical hx of stroke on Eliquis. Code stroke was activated by EMS. Patient from home where he was LKW at 1345 and now complaining of sudden onset of left facial droop, slurred speech. Patient was in the yard and then when he came back in at 1400, he was noted to have left sided facial droop and left sided weakness. EMS activated a Code Stroke and then noted left facial twitching during transport.   Stroke team at the bedside on patient arrival. Labs drawn and patient cleared for CT by Dr. Regenia Skeeter. Patient to CT with team. NIHSS 9, see documentation for details and code stroke times. Patient with left gaze preference , left hemianopia, left facial droop, left arm weakness, left decreased sensation, and dysarthria  on exam. The following imaging was completed: CT. Patient is not a candidate for tPA due to being on anticoagulation.   While in CT, 2 mg of Ativan given. See MAR. Placed on cardiac monitor in the CT scanner and noted to be 86% on RA. Placed on oxygen. Pt continued to have left facial twitching and then continued to his left arm. Became more unresponsive, but still responds to voice. Returned to room and placed on cardiac monitor. Twitching continued.   Given 2 mg of Ativan while in the room.   Care/Plan: Seizure precautions. STAT EEG.  Bedside handoff with ED RN Luiz Iron.    Kathrin Greathouse  Stroke Response RN

## 2021-08-13 NOTE — Progress Notes (Addendum)
EEG complete - results pending 

## 2021-08-13 NOTE — H&P (Signed)
NAME:  Travis Espejo., MRN:  [id number], DOB:  February 15, 1936, LOS: 0 ADMISSION DATE:  08/13/2021, CONSULTATION DATE:  8/29 REFERRING MD:  Dr. Regenia Skeeter EDP, CHIEF COMPLAINT:  Seizure   History of Present Illness:  85 year old male with PMH as below, which is significant for CVA of the R MCA, PE on Eliquis, and HTN. Last known well 8/29 at approximately 1345 before developing left sided weakness affecting the left arm and left side of his face. EMS was dispatched and upon arrival to the ED he was seen to have facial twitching on the left. He was sent for emergent CT of the head, which was non-acute. Workup more consistent with seizure, which was treated with '4mg'$  total of Ativan and a Fosphenytoin load. He did have some hypoxia after sz treatment, which was well treated with supplemental oxygen. PCCM asked to evaluate.   Pertinent  Medical History   has a past medical history of Hyperlipidemia, Hypertension, and Stroke ([place]).   Significant Hospital Events: Including procedures, antibiotic start and stop dates in addition to other pertinent events   8/29 admitted, started on LTM and intubated  Micro  ABX None  Imaging  CT head 8/29: no evidence of hemorrhage or infarct. Large remote right MCA infarct.   Interim History / Subjective:  N/a  Objective   Blood pressure (!) 164/85, pulse (!) 105, temperature (!) 97.4 F (36.3 C), temperature source Axillary, resp. rate 18, weight 90.2 kg, SpO2 100 %.       No intake or output data in the 24 hours ending 08/13/21 1724 Filed Weights   08/13/21 1600  Weight: 90.2 kg    Examination: General appearance: 85 y.o., male, unresponsive to painful stimulation Eyes: anicteric sclerae, moist conjunctivae, PERRL HENT: NCAT; LTM leads in place, oropharynx, dry MM, poor dentition Neck: Trachea midline; no lymphadenopathy, no JVD Lungs: rhonchorous, no crackles, no wheeze, with decreased respiratory effort CV: RRR, no MRGs  Abdomen: Soft,  non-tender; non-distended, BS present  Extremities: No peripheral edema, radial and DP pulses present bilaterally  Skin: Normal temperature, turgor and texture; no rash Neuro: Alert and oriented to person and place, not following commands or withdrawing to pain  CXR with prominent interstitial markings  CTH without bleed or evidence of large infarct  Resolved Hospital Problem list     Assessment & Plan:   # Acute encephalopathy: # Status epilepticus: # History of CVA - ASA 81 daily, continue statin - LTM, MRI/MRA - TTE - neuro following, AEDs per neuro - will evaluate for possibility of infectious trigger with UA. If substantial secretions post intubation then we will check tracheal aspirate   # Acute hypoxic respiratory failure: Likely that hypoventilation was playing some role in his hypoxia although we dont have a preintubation gas - intubated for airway protection in setting of above. CXR with prominent interstitial markings but no obvious focal consolidation.  - full vent support - rass -1 - f/u cxr, vbg  # PE: Discovered in 05/2021, in setting of likely RUL lung cancer for which he has previously declined further workup. - holding eliquis until MRI for risk stratification of restarting AC in case there is CNS metastatic disease  # RUL mass:  Suspected lung cancer (growing pet avid mass seen on 05/2021 cta chest) but had previously declined further workup/been lost to follow up. - ongoing goals of care discussions with family and patient as he clears  # HTN: - hold home antihypertensives as we evaluate response to  sedation   Best Practice (right click and "Reselect all SmartList Selections" daily)   Diet/type: tubefeeds DVT prophylaxis: SCD GI prophylaxis: H2B Lines: N/A Foley:  N/A Code Status:  full code Last date of multidisciplinary goals of care discussion [Updated daughter and wife at bedside 8/29 ]  Labs   CBC: Recent Labs  Lab 08/13/21 1600  08/13/21 1620  WBC 11.4*  --   NEUTROABS 7.8*  --   HGB 13.7 15.0  HCT 43.8 44.0  MCV 95.6  --   PLT 168  --     Basic Metabolic Panel: Recent Labs  Lab 08/13/21 1600 08/13/21 1620  NA 138 140  K 3.3* 3.7  CL 104 103  CO2 27  --   GLUCOSE 134* 130*  BUN 17 22  CREATININE 0.87 0.80  CALCIUM 9.6  --    GFR: Estimated Creatinine Clearance: 77.6 mL/min (by C-G formula based on SCr of 0.8 mg/dL). Recent Labs  Lab 08/13/21 1600  WBC 11.4*    Liver Function Tests: Recent Labs  Lab 08/13/21 1600  AST 22  ALT 13  ALKPHOS 46  BILITOT 0.8  PROT 7.1  ALBUMIN 3.8   No results for input(s): LIPASE, AMYLASE in the last 168 hours. No results for input(s): AMMONIA in the last 168 hours.  ABG    Component Value Date/Time   TCO2 29 08/13/2021 1620     Coagulation Profile: Recent Labs  Lab 08/13/21 1600  INR 1.0    Cardiac Enzymes: No results for input(s): CKTOTAL, CKMB, CKMBINDEX, TROPONINI in the last 168 hours.  HbA1C: Hemoglobin A1C  Date/Time Value Ref Range Status  01/24/2020 11:07 AM 6.0 (A) 4.0 - 5.6 % Final   HbA1c, POC (prediabetic range)  Date/Time Value Ref Range Status  04/25/2020 10:17 AM 5.8 5.7 - 6.4 % Final   Hgb A1c MFr Bld  Date/Time Value Ref Range Status  08/29/2020 11:35 AM 5.9 (H) 4.8 - 5.6 % Final    Comment:             Prediabetes: 5.7 - 6.4          Diabetes: >6.4          Glycemic control for adults with diabetes: <7.0   09/21/2019 11:44 AM 5.5 4.8 - 5.6 % Final    Comment:             Prediabetes: 5.7 - 6.4          Diabetes: >6.4          Glycemic control for adults with diabetes: <7.0     CBG: Recent Labs  Lab 08/13/21 1612  GLUCAP 127*    Review of Systems:   Unable to obtain due to acute encephalopathy  Past Medical History:  He,  has a past medical history of Hyperlipidemia, Hypertension, and Stroke ([place]).   Surgical History:   Past Surgical History:  Procedure Laterality Date   LIPOMA EXCISION      neck   PROSTATECTOMY     due to prostate cancer     Social History:   reports that he quit smoking about 11 years ago. His smoking use included cigarettes. He has a 50.00 pack-year smoking history. He has never used smokeless tobacco. He reports that he does not drink alcohol and does not use drugs.   Family History:  His family history includes COPD in his mother; Heart attack in his father.   Allergies No Known Allergies   Home Medications  Prior to Admission medications   Medication Sig Start Date End Date Taking? Authorizing Provider  allopurinol (ZYLOPRIM) 300 MG tablet TAKE 1 TABLET BY MOUTH EVERY DAY 01/07/21   [name], MD  amLODipine (NORVASC) 10 MG tablet TAKE 1 TABLET BY MOUTH EVERY DAY 04/18/21   [name], MD  APIXABAN Arne Cleveland) VTE STARTER PACK ('10MG'$  AND '5MG'$ ) Take as directed on package: start with two-'5mg'$  tablets twice daily for 7 days. On day 8, switch to one-'5mg'$  tablet twice daily. 05/28/21   Patrecia Pour, MD  atorvastatin (LIPITOR) 40 MG tablet TAKE 1 TABLET (40 MG TOTAL) BY MOUTH DAILY AT 6 PM. 11/11/20   [name], MD  colchicine 0.6 MG tablet Take 1 tablet (0.6 mg total) by mouth as needed (gout). 05/28/21   Patrecia Pour, MD  hydrALAZINE (APRESOLINE) 100 MG tablet Take 1 tablet (100 mg total) by mouth 2 (two) times daily. 02/19/21   [name], MD  hydrochlorothiazide (HYDRODIURIL) 25 MG tablet Take 1 tablet (25 mg total) by mouth 2 (two) times daily. 08/29/20   [name], MD  ketorolac (ACULAR) 0.5 % ophthalmic solution Place 1 drop into both eyes daily. 05/12/19   [provider]  SIMBRINZA 1-0.2 % SUSP Place 1 drop into both eyes 3 (three) times daily. 06/11/19   [provider]     Critical care time: 37 minutes    This patient is critically ill with acute encephalopathy, status epilepticus, and acute hypoxic respiratory failure; which, requires frequent high complexity decision making,  assessment, support, evaluation, and titration of therapies. This was completed through the application of advanced monitoring technologies and extensive interpretation of multiple databases. During this encounter critical care time was devoted to patient care services described in this note for 37 minutes.   Fredirick Maudlin Pulmonary/Critical Care   08/13/2021 5:36 PM

## 2021-08-13 NOTE — Procedures (Signed)
Patient Name: Travis Salazar.  MRN: [id number]  Epilepsy Attending: [name]  Referring Physician/Provider: Clance Boll, NP Date: 08/13/2021 Duration: 23.15 mins  Patient history: 85 yo male who arrived as a code stroke but was actively seizing. EEG to evaluate for status epilepticus.   Level of alertness:  lethargic   AEDs during EEG study: Ativan  Technical aspects: This EEG study was done with scalp electrodes positioned according to the 10-20 International system of electrode placement. Electrical activity was acquired at a sampling rate of '500Hz'$  and reviewed with a high frequency filter of '70Hz'$  and a low frequency filter of '1Hz'$ . EEG data were recorded continuously and digitally stored.   Description: EEG showed continuous generalized and lateralized right hemisphere 3 to 6 Hz theta-delta slowing. Admixed with 15-'18hz'$  generalized beta activity.Lateralized periodic discharges were noted in right hemisphere, maximal right temporal region with fluctuating frequency between 1-1.'5hz'$ .  Hyperventilation and photic stimulation were not performed.     ABNORMALITY - Lateralized periodic discharges, right hemisphere, maximal right temporal region - Continuous slow, generalized and lateralized right hemisphere  IMPRESSION: This study howed evidence of epileptogenicity and cortical dysfunction arising from right hemisphere, maximal right temporal region likely secondary to underlying structural abnormality, seizure. Of note, lateralized periodic discharges are on the ictal-interictal continuum with increased potential for seizure recurrence. There is also moderate to severe diffuse encephalopathy, nonspecific etiology but likely related to seizure. No seizures were seen throughout the recording.  Lawanda Holzheimer Barbra Sarks

## 2021-08-13 NOTE — Progress Notes (Signed)
Phenytoin Initial Consult Indication: status epilepticus  No Known Allergies  Patient Measurements: Height: '5\' 8"'$  (172.7 cm) Weight: 90.2 kg (198 lb 13.7 oz) IBW/kg (Calculated) : 68.4   Body mass index is 30.24 kg/m.   Vital signs: Temp: 97.4 F (36.3 C) (08/29 1722) Temp Source: Axillary (08/29 1722) BP: 156/95 (08/29 1915) Pulse Rate: 77 (08/29 1915)  Labs: Lab Results  Component Value Date/Time   Albumin 3.8 08/13/2021 1600   No results found for: PHENYTOIN, PHENOBARB, VALPROATE, CBMZ Estimated Creatinine Clearance: 77.6 mL/min (by C-G formula based on SCr of 0.8 mg/dL).   Medications: see medication record  Assessment: Corrected phenytoin level (if needed): N/A Seizure activity: EEG being completed now, patient still in the ED Significant potential drug interactions: apixaban - CCM team aware, holding apixaban for now. Will need to f/u in coming days to assess plan since needs to be on apixaban for recent PE.  Goals of care:  Total phenytoin level: 10-20 mcg/ml Free phenytoin level: 1-2 mcg/ml  Plan:  1500 mg IV x1 Max infusion rate 50 mg/min In two hours following completion of IV loading dose  Anticipated maintenance dose: 100 mg by tube TID Pharmacy will continue to follow regarding obtaining total phenytoin levels and dose adjustments as indicated.   Varney Daily, PharmD PGY1 Pharmacy Resident  Please check AMION for all [facility] pharmacy phone numbers After 10:00 PM call main pharmacy [phone]

## 2021-08-13 NOTE — Progress Notes (Signed)
RT transported pt to 4N room 30. 4N RT made aware.

## 2021-08-14 ENCOUNTER — Inpatient Hospital Stay (HOSPITAL_COMMUNITY): Payer: Medicare Other

## 2021-08-14 DIAGNOSIS — J9602 Acute respiratory failure with hypercapnia: Secondary | ICD-10-CM

## 2021-08-14 DIAGNOSIS — J9601 Acute respiratory failure with hypoxia: Secondary | ICD-10-CM

## 2021-08-14 DIAGNOSIS — G40901 Epilepsy, unspecified, not intractable, with status epilepticus: Secondary | ICD-10-CM | POA: Diagnosis not present

## 2021-08-14 LAB — CBC
HCT: 40.3 % (ref 39.0–52.0)
Hemoglobin: 12.9 g/dL — ABNORMAL LOW (ref 13.0–17.0)
MCH: 29.6 pg (ref 26.0–34.0)
MCHC: 32 g/dL (ref 30.0–36.0)
MCV: 92.4 fL (ref 80.0–100.0)
Platelets: 137 10*3/uL — ABNORMAL LOW (ref 150–400)
RBC: 4.36 MIL/uL (ref 4.22–5.81)
RDW: 14.4 % (ref 11.5–15.5)
WBC: 8.3 10*3/uL (ref 4.0–10.5)
nRBC: 0 % (ref 0.0–0.2)

## 2021-08-14 LAB — BLOOD GAS, VENOUS
Acid-Base Excess: 0.3 mmol/L (ref 0.0–2.0)
Bicarbonate: 25.8 mmol/L (ref 20.0–28.0)
Drawn by: 1381
FIO2: 100
O2 Saturation: 77.9 %
Patient temperature: 37
pCO2, Ven: 52.4 mmHg (ref 44.0–60.0)
pH, Ven: 7.313 (ref 7.250–7.430)
pO2, Ven: 47.4 mmHg — ABNORMAL HIGH (ref 32.0–45.0)

## 2021-08-14 LAB — BASIC METABOLIC PANEL
Anion gap: 15 (ref 5–15)
BUN: 18 mg/dL (ref 8–23)
CO2: 24 mmol/L (ref 22–32)
Calcium: 9.8 mg/dL (ref 8.9–10.3)
Chloride: 98 mmol/L (ref 98–111)
Creatinine, Ser: 0.88 mg/dL (ref 0.61–1.24)
GFR, Estimated: >60 mL/min (ref >60)
Glucose, Bld: 105 mg/dL — ABNORMAL HIGH (ref 70–99)
Potassium: 4.2 mmol/L (ref 3.5–5.1)
Sodium: 137 mmol/L (ref 135–145)

## 2021-08-14 LAB — URINALYSIS, ROUTINE W REFLEX MICROSCOPIC
Bilirubin Urine: NEGATIVE
Glucose, UA: NEGATIVE mg/dL
Hgb urine dipstick: NEGATIVE
Ketones, ur: NEGATIVE mg/dL
Leukocytes,Ua: NEGATIVE
Nitrite: NEGATIVE
Protein, ur: NEGATIVE mg/dL
Specific Gravity, Urine: 1.019 (ref 1.005–1.030)
pH: 5 (ref 5.0–8.0)

## 2021-08-14 LAB — PHOSPHORUS: Phosphorus: 4.3 mg/dL (ref 2.5–4.6)

## 2021-08-14 LAB — MRSA NEXT GEN BY PCR, NASAL: MRSA by PCR Next Gen: NOT DETECTED

## 2021-08-14 LAB — MAGNESIUM: Magnesium: 2.1 mg/dL (ref 1.7–2.4)

## 2021-08-14 LAB — TRIGLYCERIDES: Triglycerides: 89 mg/dL (ref <150)

## 2021-08-14 MED ORDER — FENTANYL CITRATE (PF) 100 MCG/2ML IJ SOLN: 25.0000 ug | INTRAMUSCULAR | Status: DC | PRN

## 2021-08-14 MED ORDER — DEXMEDETOMIDINE HCL IN NACL 400 MCG/100ML IV SOLN
0.4000 ug/kg/h | INTRAVENOUS | Status: DC
  Administered 2021-08-14 – 2021-08-15 (×3): 0.4 ug/kg/h via INTRAVENOUS
  Filled 2021-08-14 (×3): qty 100

## 2021-08-14 NOTE — Progress Notes (Signed)
NAME:  Travis Swor., MRN:  [id number], DOB:  1936/04/09, LOS: 1 ADMISSION DATE:  08/13/2021, CONSULTATION DATE:  8/29 REFERRING MD:  Dr. Regenia Skeeter EDP, CHIEF COMPLAINT:  Seizure   History of Present Illness:  85 year old male with PMH as below, which is significant for CVA of the R MCA, PE on Eliquis, and HTN. Last known well 8/29 at approximately 1345 before developing left sided weakness affecting the left arm and left side of his face. EMS was dispatched and upon arrival to the ED he was seen to have facial twitching on the left. He was sent for emergent CT of the head, which was non-acute. Workup more consistent with seizure, which was treated with 56m total of Ativan and a Fosphenytoin load. He did have some hypoxia after sz treatment, which was well treated with supplemental oxygen. PCCM asked to evaluate.   Pertinent  Medical History  Hyperlipidemia Hypertension Stroke Prostate cancer post prostatectomy Former smoker quite in 2010   [facility]:   [facility], started on LTM and intubated  Micro COVID 8/29 negative  MRSA PCR 8/29 negative   ABX None  Imaging CT head 8/29: no evidence of hemorrhage or infarct. Large remote right MCA infarct.  MRI Brain 8/30 Right MCA territory encephalomalacia with chronic blood products.  Interim History / Subjective:  No acute events overnight   Objective   Blood pressure 134/80, pulse 74, temperature 97.8 F (36.6 C), temperature source Oral, resp. rate 16, height '5\' 8"'  (1.727 m), weight 89.5 kg, SpO2 100 %.    Vent Mode: PRVC FiO2 (%):  [90 %-100 %] 90 % Set Rate:  [16 bmp] 16 bmp Vt Set:  [540 mL] 540 mL PEEP:  [5 cmH20] 5 cmH20 Plateau Pressure:  [9 cmH20-16 cmH20] 16 cmH20   Intake/Output Summary (Last 24 hours) at 08/14/2021 0724 Last data filed at 08/14/2021 0600 Gross per 24 hour  Intake 277.59 ml  Output 770 ml  Net -492.41 ml   Filed Weights   08/13/21 1600 08/14/21 0415  Weight: 90.2 kg 89.5 kg     Examination: General: Acute on chronically ill appearing elderly male lying in bed on mechanical ventilation, in NAD HEENT: ETT, MM pink/moist, PERRL,  Neuro: Sedated on vent but seen moving spontaneously when sedation lightened  CV: s1s2 regular rate and rhythm, no murmur, rubs, or gallops,  PULM:  Clear to ascultation, no increased work of breathing, tolerating vent  GI: soft, bowel sounds active in all 4 quadrants, non-tender, non-distended, tolerating TF Extremities: warm/dry, no edema  Skin: no rashes or lesions  Resolved Hospital Problem list     Assessment & Plan:  Acute encephalopathy Status epilepticus -EEG negative for any seizure activity but showed evidence of epileptogenicity and cortical dysfunction arising from right hemisphere History of CVA - Evaluate for possibility of infectious trigger with UA. If substantial secretions post intubation then we will check tracheal aspirate  P: Management per neurology  Maintain neuro protective measures; goal for eurothermia, euglycemia, eunatermia, normoxia, and PCO2 goal of 35-40 Nutrition and bowel regiment  Seizure precautions  AEDs per neurology  Aspirations precautions  Continue ASA and statin   Acute hypoxic respiratory failure -Likely that hypoventilation was playing some role in his hypoxia although we dont have a preintubation gas - intubated for airway protection in setting of above. CXR with prominent interstitial markings but no obvious focal consolidation.  P: Continue ventilator support with lung protective strategies  Wean PEEP and FiO2 for sats  greater than 90%. Head of bed elevated 30 degrees. Plateau pressures less than 30 cm H20.  Follow intermittent chest x-ray and ABG.   SAT/SBT as tolerated, mentation preclude extubation  Ensure adequate pulmonary hygiene  Follow cultures  VAP bundle in place  PAD protocol  Hx of pulmonary embolism  -Discovered in 05/2021, in setting of likely RUL lung cancer  for which he has previously declined further workup.  -Chronically anticoagulated with Eliquis  P: MRI Brain negative  Will discuss with neuro anticoagulation paln  - holding eliquis until MRI for risk stratification of restarting AC in case there is CNS metastatic disease  RUL mass -Suspected lung cancer (growing pet avid mass seen on 05/2021 cta chest) but had previously declined further workup/been lost to follow up. -Patient met with oncology and cardiothoracic surgery january and February of 2021 and decline lung resection or any treatment at that time  P: Will continue to discuss goals of care with family and patient if able   Hx of HTN: -Home medications include Norvasc, and HCTZ P: Home medications remains on hold   Best Practice    Diet/type: tubefeeds DVT prophylaxis: SCD GI prophylaxis: H2B Lines: N/A Foley:  N/A Code Status:  full code Last date of multidisciplinary goals of care discussion [Updated daughter and wife at bedside 8/29 ]  Critical care time:   Performed by: Lillyona Polasek D. Harris  Total critical care time: 37 minutes  Critical care time was exclusive of separately billable procedures and treating other patients.  Critical care was necessary to treat or prevent imminent or life-threatening deterioration.  Critical care was time spent personally by me on the following activities: development of treatment plan with patient and/or surrogate as well as nursing, discussions with consultants, evaluation of patient's response to treatment, examination of patient, obtaining history from patient or surrogate, ordering and performing treatments and interventions, ordering and review of laboratory studies, ordering and review of radiographic studies, pulse oximetry and re-evaluation of patient's condition.  Travis Salazar D. Kenton Kingfisher, NP-C [facility] Personal contact information can be found on Amion  08/14/2021, 7:31 AM

## 2021-08-14 NOTE — Progress Notes (Signed)
LTM maintenance performed. No skin breakdown noted. Button was tested.

## 2021-08-14 NOTE — Progress Notes (Signed)
Neurology Progress Note  S: Patient is unable to participate in robust ROS because of mental status/intubation.   Overnight events: Foley placed for urinary retention and intubation at [facility] 08/13/21.   Propofol weaned off. Noted to waking up. Had to be started on precedex for intermittent agitation.   O: Current vital signs: BP 139/78   Pulse 75   Temp 97.8 F (36.6 C) (Oral)   Resp 16   Ht '5\' 8"'  (1.727 m)   Wt 89.5 kg   SpO2 100%   BMI 30.00 kg/m  Vital signs in last 24 hours: Temp:  [97.4 F (36.3 C)-97.9 F (36.6 C)] 97.8 F (36.6 C) (08/30 0400) Pulse Rate:  [70-105] 75 (08/30 0900) Resp:  [16-27] 16 (08/30 0900) BP: (132-201)/(78-128) 139/78 (08/30 0900) SpO2:  [86 %-100 %] 100 % (08/30 0900) FiO2 (%):  [60 %-100 %] 60 % (08/30 0734) Weight:  [89.5 kg-90.2 kg] 89.5 kg (08/30 0415)  GENERAL: Acutely ill appearing male lying in ICU bed intubated on ventilator.  HEENT: Normocephalic and atraumatic. EEG leads on.  LUNGS: ventilator.  CV: RRR on tele.  Ext: warm.   NEURO:  Mental Status: Opens eyes to voice, follow commands in all extremities. Speech/Language: no speech or sounds, on ventilator. ETT in place.   Cranial Nerves:  II: PERRL 31m sluggish. III, IV, VI: EOMI. Eyes are midline and briefly tracks my face. No nystagmus.  Motor:  RUE: moves his R hand on command. RLE: wiggles toes on command LUE: moves his L hand on command. LLE: wiggles toes on command. Tone: normal, bulk: normal Sensation- intact to touch. Coordination: unable to assess.  Medications  Current Facility-Administered Medications:    chlorhexidine gluconate (MEDLINE KIT) (PERIDEX) 0.12 % solution 15 mL, 15 mL, Mouth Rinse, BID, MVerlee MonteNHortencia Conradi MD, 15 mL at 08/14/21 09485  Chlorhexidine Gluconate Cloth 2 % PADS 6 each, 6 each, Topical, Daily, MVerlee Monte NHortencia Conradi MD   dexmedetomidine (PRECEDEX) 400 MCG/100ML (4 mcg/mL) infusion, 0.4-1.2 mcg/kg/hr, Intravenous, Titrated, Harris,  Whitney D, NP, Last Rate: 8.95 mL/hr at 08/14/21 0912, 0.4 mcg/kg/hr at 08/14/21 0912   docusate (COLACE) 50 MG/5ML liquid 100 mg, 100 mg, Per Tube, BID, MMaryjane Hurter MD, 100 mg at 08/14/21 0908   famotidine (PEPCID) tablet 20 mg, 20 mg, Per Tube, BID, MMaryjane Hurter MD, 20 mg at 08/14/21 04627  fentaNYL (SUBLIMAZE) injection 25 mcg, 25 mcg, Intravenous, Q15 min PRN, Harris, Whitney D, NP   fentaNYL (SUBLIMAZE) injection 25-100 mcg, 25-100 mcg, Intravenous, Q30 min PRN, Harris, Whitney D, NP   hydrALAZINE (APRESOLINE) injection 10-20 mg, 10-20 mg, Intravenous, Q4H PRN, HCorey Harold NP   MEDLINE mouth rinse, 15 mL, Mouth Rinse, 10 times per day, MMaryjane Hurter MD, 15 mL at 08/14/21 0908   phenytoin (DILANTIN) 125 MG/5ML suspension 100 mg, 100 mg, Per Tube, TID, MMaryjane Hurter MD, 100 mg at 08/14/21 0908   polyethylene glycol (MIRALAX / GLYCOLAX) packet 17 g, 17 g, Per Tube, Daily, MMaryjane Hurter MD, 17 g at 08/14/21 0907  EEG 08/14/21 Lateralized periodic discharges, right hemisphere, maximal right temporal region. Continuous slow, generalized . This study initially showed evidence of epileptogenicity and cortical dysfunction arising from right hemisphere, maximal right temporal region likely secondary to underlying structural abnormality, seizure. Propofol was started at around 1820 on 08/13/2021 after which LPDs were not seen. There was also moderate to severe diffuse encephalopathy, nonspecific etiology but likely related to sedation. No seizures were seen throughout  the recording.  CT Head 08/13/21.  No evidence of acute large vascular territory infarct or acute hemorrhage. ASPECTS is 10. Large remote right MCA territory infarcts.  MRI Brain  No acute intracranial abnormality. Right MCA territory encephalomalacia with chronic blood products.  Assessment: 85 yo male who is admitted with status epilepticus. He was started on Dilantin and propofol, intubated and  admitted to the ICU. CTH negative for acute. cEEG with R hemispheric epileptogenicity and cortical dysfunction. MRI Brain with chronic R hemispheric encephalomalacia due to prior R MCA stroke. I suspect that his old R MCA stroke was likely the seizure focus.   His propofol has been weaned off. He is on Precedex for agitation.  Impression: -Status epilepticus, resolved.  -New onset seizure disorder.  -Recent history of PEs. 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   Recommendations/Plan: -Continue seizure precautions. -Continue Dilantin 181m TID. -cEEG overnight. Can take down tomorrow if no seizures overnight.  SPowellPager Number [phone]

## 2021-08-14 NOTE — Progress Notes (Signed)
[facility] Progress Note Patient Name: Travis Salazar. DOB: 01/22/1936 MRN: [id number]   Date of Service  08/14/2021  HPI/Events of Note  Patient with acute urinary retention.  eICU Interventions  Foley catheter protocol ordered.        Kerry Kass Sutton Plake 08/14/2021, 4:19 AM

## 2021-08-14 NOTE — Procedures (Addendum)
Patient Name: Travis Salazar.  MRN: [id number]  Epilepsy Attending: [name]  Referring Physician/Provider: Clance Boll, NP Duration: 08/13/2021 1750 to 08/14/2021 1750   Patient history: 86 yo male who arrived as a code stroke but was actively seizing. EEG to evaluate for status epilepticus.    Level of alertness:  lethargic    AEDs during EEG study: PHT, Propofol   Technical aspects: This EEG study was done with scalp electrodes positioned according to the 10-20 International system of electrode placement. Electrical activity was acquired at a sampling rate of '500Hz'$  and reviewed with a high frequency filter of '70Hz'$  and a low frequency filter of '1Hz'$ . EEG data were recorded continuously and digitally stored.    Description: EEG showed continuous generalized 3 to 6 Hz theta-delta slowing admixed with 15-'18hz'$  generalized beta activity. At the beginning of study, lateralized periodic discharges (LPDs) were noted in right hemisphere, maximal right temporal region with fluctuating frequency between 1-1.'5hz'$ . Propofol was started at around 1820 on 08/13/2021 after which LPDs were not seen.  On 08/14/2021 at 0900 propofol was stopped after which EEG again showed right LPDs at '1Hz'$ . Hyperventilation and photic stimulation were not performed.      ABNORMALITY - Lateralized periodic discharges, right hemisphere, maximal right temporal region - Continuous slow, generalized   IMPRESSION: This study initially showed evidence of epileptogenicity and cortical dysfunction arising from right hemisphere, maximal right temporal region likely secondary to underlying structural abnormality, seizure.  There was also moderate to severe diffuse encephalopathy, nonspecific etiology but likely related to sedation. No seizures were seen throughout the recording.   Tamanika Heiney Barbra Sarks

## 2021-08-15 DIAGNOSIS — G40901 Epilepsy, unspecified, not intractable, with status epilepticus: Secondary | ICD-10-CM | POA: Diagnosis not present

## 2021-08-15 DIAGNOSIS — J9601 Acute respiratory failure with hypoxia: Secondary | ICD-10-CM | POA: Diagnosis not present

## 2021-08-15 LAB — CBC
HCT: 36.9 % — ABNORMAL LOW (ref 39.0–52.0)
Hemoglobin: 12.1 g/dL — ABNORMAL LOW (ref 13.0–17.0)
MCH: 30 pg (ref 26.0–34.0)
MCHC: 32.8 g/dL (ref 30.0–36.0)
MCV: 91.3 fL (ref 80.0–100.0)
Platelets: 106 10*3/uL — ABNORMAL LOW (ref 150–400)
RBC: 4.04 MIL/uL — ABNORMAL LOW (ref 4.22–5.81)
RDW: 14.6 % (ref 11.5–15.5)
WBC: 9.3 10*3/uL (ref 4.0–10.5)
nRBC: 0 % (ref 0.0–0.2)

## 2021-08-15 LAB — BASIC METABOLIC PANEL
Anion gap: 8 (ref 5–15)
BUN: 19 mg/dL (ref 8–23)
CO2: 24 mmol/L (ref 22–32)
Calcium: 9.3 mg/dL (ref 8.9–10.3)
Chloride: 103 mmol/L (ref 98–111)
Creatinine, Ser: 0.77 mg/dL (ref 0.61–1.24)
GFR, Estimated: >60 mL/min (ref >60)
Glucose, Bld: 119 mg/dL — ABNORMAL HIGH (ref 70–99)
Potassium: 3.7 mmol/L (ref 3.5–5.1)
Sodium: 135 mmol/L (ref 135–145)

## 2021-08-15 MED ORDER — APIXABAN 5 MG PO TABS: 5.0000 mg | ORAL_TABLET | Freq: Two times a day (BID) | ORAL | Status: DC

## 2021-08-15 MED ORDER — DOCUSATE SODIUM 50 MG/5ML PO LIQD
100.0000 mg | Freq: Two times a day (BID) | ORAL | Status: DC
  Administered 2021-08-16 – 2021-08-19 (×6): 100 mg via ORAL
  Filled 2021-08-15 (×12): qty 10

## 2021-08-15 MED ORDER — BETHANECHOL CHLORIDE 10 MG PO TABS
10.0000 mg | ORAL_TABLET | Freq: Three times a day (TID) | ORAL | Status: DC
  Administered 2021-08-15: 10 mg
  Filled 2021-08-15: qty 1

## 2021-08-15 MED ORDER — CHLORHEXIDINE GLUCONATE 0.12 % MT SOLN
15.0000 mL | Freq: Two times a day (BID) | OROMUCOSAL | Status: DC
  Administered 2021-08-15 – 2021-08-21 (×11): 15 mL via OROMUCOSAL
  Filled 2021-08-15 (×10): qty 15

## 2021-08-15 MED ORDER — AMLODIPINE BESYLATE 10 MG PO TABS
10.0000 mg | ORAL_TABLET | Freq: Every day | ORAL | Status: DC
  Administered 2021-08-16 – 2021-08-23 (×8): 10 mg via ORAL
  Filled 2021-08-15 (×8): qty 1

## 2021-08-15 MED ORDER — MELATONIN 3 MG PO TABS
3.0000 mg | ORAL_TABLET | Freq: Every evening | ORAL | Status: DC | PRN
  Administered 2021-08-15 – 2021-08-20 (×4): 3 mg via ORAL
  Filled 2021-08-15 (×5): qty 1

## 2021-08-15 MED ORDER — PHENYTOIN 125 MG/5ML PO SUSP
100.0000 mg | Freq: Three times a day (TID) | ORAL | Status: DC
  Administered 2021-08-15: 100 mg via ORAL
  Filled 2021-08-15 (×5): qty 4

## 2021-08-15 MED ORDER — ATORVASTATIN CALCIUM 40 MG PO TABS
40.0000 mg | ORAL_TABLET | Freq: Every day | ORAL | Status: DC
  Administered 2021-08-15 – 2021-08-22 (×8): 40 mg via ORAL
  Filled 2021-08-15 (×8): qty 1

## 2021-08-15 MED ORDER — BETHANECHOL CHLORIDE 10 MG PO TABS
10.0000 mg | ORAL_TABLET | Freq: Three times a day (TID) | ORAL | Status: DC
  Administered 2021-08-15 – 2021-08-23 (×24): 10 mg via ORAL
  Filled 2021-08-15 (×24): qty 1

## 2021-08-15 MED ORDER — METOPROLOL TARTRATE 12.5 MG HALF TABLET
12.5000 mg | ORAL_TABLET | Freq: Two times a day (BID) | ORAL | Status: DC
  Administered 2021-08-15 – 2021-08-23 (×16): 12.5 mg via ORAL
  Filled 2021-08-15 (×16): qty 1

## 2021-08-15 MED ORDER — AMLODIPINE BESYLATE 10 MG PO TABS
10.0000 mg | ORAL_TABLET | Freq: Every day | ORAL | Status: DC
  Administered 2021-08-15: 10 mg
  Filled 2021-08-15: qty 1

## 2021-08-15 MED ORDER — POLYETHYLENE GLYCOL 3350 17 G PO PACK
17.0000 g | PACK | Freq: Every day | ORAL | Status: DC
  Administered 2021-08-16 – 2021-08-19 (×4): 17 g via ORAL
  Filled 2021-08-15 (×7): qty 1

## 2021-08-15 MED ORDER — FAMOTIDINE 20 MG PO TABS
20.0000 mg | ORAL_TABLET | Freq: Two times a day (BID) | ORAL | Status: DC
  Administered 2021-08-15 – 2021-08-16 (×2): 20 mg via ORAL
  Filled 2021-08-15 (×2): qty 1

## 2021-08-15 MED ORDER — METOPROLOL TARTRATE 25 MG PO TABS
12.5000 mg | ORAL_TABLET | Freq: Two times a day (BID) | ORAL | Status: DC
  Administered 2021-08-15: 12.5 mg
  Filled 2021-08-15: qty 1

## 2021-08-15 MED ORDER — HYDRALAZINE HCL 50 MG PO TABS
100.0000 mg | ORAL_TABLET | Freq: Two times a day (BID) | ORAL | Status: DC
  Administered 2021-08-15: 100 mg
  Filled 2021-08-15: qty 2

## 2021-08-15 MED ORDER — APIXABAN 5 MG PO TABS
5.0000 mg | ORAL_TABLET | Freq: Two times a day (BID) | ORAL | Status: DC
  Administered 2021-08-15 – 2021-08-23 (×16): 5 mg via ORAL
  Filled 2021-08-15 (×16): qty 1

## 2021-08-15 MED ORDER — DIPHENHYDRAMINE HCL 50 MG/ML IJ SOLN
25.0000 mg | Freq: Once | INTRAMUSCULAR | Status: AC
  Administered 2021-08-15: 25 mg via INTRAVENOUS
  Filled 2021-08-15: qty 1

## 2021-08-15 MED ORDER — HYDRALAZINE HCL 50 MG PO TABS: 100.0000 mg | ORAL_TABLET | Freq: Two times a day (BID) | ORAL | Status: DC

## 2021-08-15 MED ORDER — ORAL CARE MOUTH RINSE
15.0000 mL | Freq: Two times a day (BID) | OROMUCOSAL | Status: DC
  Administered 2021-08-15 – 2021-08-19 (×8): 15 mL via OROMUCOSAL

## 2021-08-15 MED ORDER — SODIUM CHLORIDE 0.9 % IV SOLN
40.0000 mg/kg | Freq: Once | INTRAVENOUS | Status: AC
  Administered 2021-08-15: 3580 mg via INTRAVENOUS
  Filled 2021-08-15: qty 35.8

## 2021-08-15 MED ORDER — ATORVASTATIN CALCIUM 40 MG PO TABS: 40.0000 mg | ORAL_TABLET | Freq: Every day | ORAL | Status: DC

## 2021-08-15 MED ORDER — LEVETIRACETAM IN NACL 1000 MG/100ML IV SOLN
1000.0000 mg | Freq: Two times a day (BID) | INTRAVENOUS | Status: DC
  Administered 2021-08-16: 1000 mg via INTRAVENOUS
  Filled 2021-08-15: qty 100

## 2021-08-15 MED ORDER — LACTATED RINGERS IV BOLUS
500.0000 mL | Freq: Once | INTRAVENOUS | Status: AC
  Administered 2021-08-15: 500 mL via INTRAVENOUS

## 2021-08-15 NOTE — Progress Notes (Signed)
LTM leads checked= reapplied leads and all good under 5 ohms. Marker button checked. Notch on. Skin check= no skin breakdown.

## 2021-08-15 NOTE — Progress Notes (Signed)
NAME:  Travis Salazar., MRN:  [id number], DOB:  August 21, 1936, LOS: 2 ADMISSION DATE:  08/13/2021, CONSULTATION DATE:  8/29 REFERRING MD:  Dr. Regenia Skeeter EDP, CHIEF COMPLAINT:  Seizure   History of Present Illness:  85 year old male with PMH as below, which is significant for CVA of the R MCA, PE on Eliquis, and HTN. Last known well 8/29 at approximately 1345 before developing left sided weakness affecting the left arm and left side of his face. EMS was dispatched and upon arrival to the ED he was seen to have facial twitching on the left. He was sent for emergent CT of the head, which was non-acute. Workup more consistent with seizure, which was treated with $RemoveBefo'4mg'yZybJSqAdOo$  total of Ativan and a Fosphenytoin load. He did have some hypoxia after sz treatment, which was well treated with supplemental oxygen. PCCM asked to evaluate.   Pertinent  Medical History  Hyperlipidemia Hypertension Stroke Prostate cancer post prostatectomy Former smoker quite in 2010   [facility] Events:   8/29 admitted, started on LTM and intubated  Micro COVID 8/29 negative  MRSA PCR 8/29 negative   ABX None  Imaging CT head 8/29: no evidence of hemorrhage or infarct. Large remote right MCA infarct.  MRI Brain 8/30 Right MCA territory encephalomalacia with chronic blood products.  Interim History / Subjective:  No acute event overnight   Objective   Blood pressure 138/78, pulse 65, temperature 97.9 F (36.6 C), temperature source Axillary, resp. rate 16, height $RemoveBe'5\' 8"'GDpmVpHUc$  (1.727 m), weight 89.5 kg, SpO2 100 %.    Vent Mode: PRVC FiO2 (%):  [60 %] 60 % Set Rate:  [16 bmp] 16 bmp Vt Set:  [540 mL] 540 mL PEEP:  [5 cmH20] 5 cmH20 Plateau Pressure:  [14 cmH20-15 cmH20] 15 cmH20   Intake/Output Summary (Last 24 hours) at 08/15/2021 4888 Last data filed at 08/15/2021 9169 Gross per 24 hour  Intake 171.67 ml  Output 805 ml  Net -633.33 ml    Filed Weights   08/13/21 1600 08/14/21 0415  Weight: 90.2 kg 89.5 kg     Examination: General: Acute on chronically ill appearing elderly male lying in bed on mechanical ventilation, in NAD HEENT: ETT, MM pink/moist, PERRL,  Neuro: Able to follow simple commands on low dose precedex drip CV: s1s2 regular rate and rhythm, no murmur, rubs, or gallops,  PULM:  Clear to ascultation no increased work of breathing, tolerating vent  GI: soft, bowel sounds active in all 4 quadrants, non-tender, non-distended, tolerating TF Extremities: warm/dry, no edema  Skin: no rashes or lesions  Resolved Hospital Problem list     Assessment & Plan:  Acute encephalopathy Status epilepticus -EEG negative for any seizure activity but showed evidence of epileptogenicity and cortical dysfunction arising from right hemisphere History of CVA - Evaluate for possibility of infectious trigger with UA. If substantial secretions post intubation then we will check tracheal aspirate  P: Management per neurology  Maintain neuro protective measures; goal for eurothermia, euglycemia, eunatermia, normoxia, and PCO2 goal of 35-40 Nutrition and bowel regiment  Seizure precautions  AEDs per neurology  Aspirations precautions  Continue ASA and statin  Acute hypoxic respiratory failure -Likely that hypoventilation was playing some role in his hypoxia although we dont have a preintubation gas - intubated for airway protection in setting of above. CXR with prominent interstitial markings but no obvious focal consolidation.  P: Continue ventilator support with lung protective strategies  Wean PEEP and FiO2 for sats greater than  90% Continue to wean sedation to allow for SBT and hopeful extubation  Head of bed elevated 30 degrees. Plateau pressures less than 30 cm H20.  Follow intermittent chest x-ray and ABG.   Ensure adequate pulmonary hygiene  Follow cultures  VAP bundle in place  PAD protocol  Hx of pulmonary embolism  -Discovered in 05/2021, in setting of likely RUL lung cancer for  which he has previously declined further workup.  -Chronically anticoagulated with Eliquis  P: Resume home anticoagulation   RUL mass -Suspected lung cancer (growing pet avid mass seen on 05/2021 cta chest) but had previously declined further workup/been lost to follow up. -Patient met with oncology and cardiothoracic surgery january and February of 2021 and decline lung resection or any treatment at that time  P: Outpatient follow up if patient desires   Hx of HTN: -Home medications include Norvasc, hydralazine, and HCTZ P: Resume home Norvasc and hydralazine   Best Practice    Diet/type: tubefeeds DVT prophylaxis: SCD GI prophylaxis: H2B Lines: N/A Foley:  N/A Code Status:  full code Last date of multidisciplinary goals of care discussion [Updated daughter and wife at bedside 8/29 ]  Critical care time:   Performed by: Daven Pinckney D. Harris  Total critical care time: 38 minutes  Critical care time was exclusive of separately billable procedures and treating other patients.  Critical care was necessary to treat or prevent imminent or life-threatening deterioration.  Critical care was time spent personally by me on the following activities: development of treatment plan with patient and/or surrogate as well as nursing, discussions with consultants, evaluation of patient's response to treatment, examination of patient, obtaining history from patient or surrogate, ordering and performing treatments and interventions, ordering and review of laboratory studies, ordering and review of radiographic studies, pulse oximetry and re-evaluation of patient's condition.  Shuan Statzer D. Kenton Kingfisher, NP-C [facility] Personal contact information can be found on Amion  08/15/2021, 7:13 AM

## 2021-08-15 NOTE — Procedures (Signed)
Extubation Procedure Note  Patient Details:   Name: Casey Sadlowski. DOB: 05-19-1936 MRN: [id number]   Airway Documentation:    Vent end date: 08/15/21 Vent end time: 1000   Evaluation  O2 sats: stable throughout Complications: No apparent complications Patient did tolerate procedure well. Bilateral Breath Sounds: Diminished   Yes  Pt was extubated per Md order and placed on 3 L . Cuff leak was noted prior to extubation and no stridor post. Pt is stable at this time. Rt will monitor.   Ronaldo Miyamoto 08/15/2021, 10:01 AM

## 2021-08-15 NOTE — Progress Notes (Signed)
MEDICATION RELATED CONSULT NOTE  Mr Travis Salazar is an 85 yo M admitted with status epilepticus and initiated on phenytoin.  Phenytoin has a significant drug interaction with apixaban which he is on PTA for hx PE.  Neurology has switched patient to [place] and asked pharmacy to wean phenytoin.  Pt has received <48 hours of phenytoin.  No need to wean at this time.  Keppra dose is adequate.  Thanks, [employer], Pharm.D., BCPS Clinical Pharmacist **Pharmacist phone directory can be found on [email or web address] listed under [place].  08/15/2021 6:14 PM

## 2021-08-15 NOTE — Procedures (Addendum)
Patient Name: Travis Salazar.  MRN: [id number]  Epilepsy Attending: [name]  Referring Physician/Provider: Clance Boll, NP Duration: 08/14/2021 1750 to 08/15/2021 0930   Patient history: 85 yo male who arrived as a code stroke but was actively seizing. EEG to evaluate for status epilepticus.    Level of alertness: awake, asleep   AEDs during EEG study: PHT   Technical aspects: This EEG study was done with scalp electrodes positioned according to the 10-20 International system of electrode placement. Electrical activity was acquired at a sampling rate of '500Hz'$  and reviewed with a high frequency filter of '70Hz'$  and a low frequency filter of '1Hz'$ . EEG data were recorded continuously and digitally stored.    Description: The posterior dominant rhythm consists of 9-10 Hz activity of moderate voltage (25-35 uV) seen predominantly in posterior head regions, symmetric and reactive to eye opening and eye closing.  Sleep was characterized by vertex waves, sleep spindles (12 to 14 Hz), maximal frontocentral region.  Abundant sharp waves were noted in right temporal region, at times rhythmic and qasi periodic at  every 2-3 seconds. Hyperventilation and photic stimulation were not performed.      ABNORMALITY - Sharp waves, right temporal region   IMPRESSION: This study showed evidence of epileptogenicity arising from right temporal region. No seizures were seen throughout the recording.  EEG appears to be improving compared to previous day.    Tuyen Uncapher Barbra Sarks

## 2021-08-15 NOTE — Progress Notes (Signed)
[facility] notified. No skin breakdown noted.

## 2021-08-15 NOTE — Progress Notes (Signed)
Neurology Progress Note  S: Patient slightly disoriented but knows he is in the hospital and can have an amusing conversation.     O: Current vital signs: BP 97/81   Pulse (!) 123   Temp 98 F (36.7 C) (Oral)   Resp (!) 22   Ht _0  (1.727 m)   Wt 89.5 kg   SpO2 96%   BMI 30.00 kg/m  Vital signs in last 24 hours: Temp:  [97.8 F (36.6 C)-98.1 F (36.7 C)] 98 F (36.7 C) (08/31 1600) Pulse Rate:  [60-128] 123 (08/31 1600) Resp:  [16-26] 22 (08/31 1700) BP: (91-154)/(58-92) 97/81 (08/31 1700) SpO2:  [90 %-100 %] 96 % (08/31 1600) FiO2 (%):  [40 %-60 %] 40 % (08/31 0845)  GENERAL: Acutely ill appearing male lying in ICU bed intubated on ventilator.  HEENT: Normocephalic and atraumatic.  LUNGS: ventilator.  CV: RRR on tele.  Ext: warm.   NEURO:  Mental Status: Opens eyes to voice, follow commands in all extremities. Speech/Language: mild dysarthria, no aphasia  Cranial Nerves:  II: PERRL 63m sluggish. III, IV, VI: EOMI.  Motor:  RUE: moves his R hand on command. RLE: wiggles toes on command LUE: moves his L hand on command. LLE: wiggles toes on command. Tone: normal, bulk: normal Sensation- intact to touch. Coordination: unable to assess.  Medications  Current Facility-Administered Medications:    [START ON 08/16/2021] amLODipine (NORVASC) tablet 10 mg, 10 mg, Oral, Daily, Harris, Whitney D, NP   apixaban (ELIQUIS) tablet 5 mg, 5 mg, Oral, BID, Harris, Whitney D, NP   atorvastatin (LIPITOR) tablet 40 mg, 40 mg, Oral, q1800, Harris, Whitney D, NP   bethanechol (URECHOLINE) tablet 10 mg, 10 mg, Oral, TID, Harris, Whitney D, NP, 10 mg at 08/15/21 1546   chlorhexidine (PERIDEX) 0.12 % solution 15 mL, 15 mL, Mouth Rinse, BID, Harris, Whitney D, NP   chlorhexidine gluconate (MEDLINE KIT) (PERIDEX) 0.12 % solution 15 mL, 15 mL, Mouth Rinse, BID, MMaryjane Hurter MD, 15 mL at 08/15/21 03710  Chlorhexidine Gluconate Cloth 2 % PADS 6 each, 6 each, Topical, Daily,  MMaryjane Hurter MD, 6 each at 08/14/21 2122   docusate (COLACE) 50 MG/5ML liquid 100 mg, 100 mg, Oral, BID, Harris, Whitney D, NP   famotidine (PEPCID) tablet 20 mg, 20 mg, Oral, BID, Harris, Whitney D, NP   hydrALAZINE (APRESOLINE) injection 10-20 mg, 10-20 mg, Intravenous, Q4H PRN, HCorey Harold NP   hydrALAZINE (APRESOLINE) tablet 100 mg, 100 mg, Oral, BID, Harris, Whitney D, NP   levETIRAcetam (KEPPRA) 3,580 mg in sodium chloride 0.9 % 250 mL IVPB, 40 mg/kg, Intravenous, Once **FOLLOWED BY** [START ON 08/16/2021] levETIRAcetam (KEPPRA) IVPB 1000 mg/100 mL premix, 1,000 mg, Intravenous, Q12H, SDerek Jack MD   MEDLINE mouth rinse, 15 mL, Mouth Rinse, q12n4p, Harris, Whitney D, NP, 15 mL at 08/15/21 1546   metoprolol tartrate (LOPRESSOR) tablet 12.5 mg, 12.5 mg, Oral, BID, Harris, Whitney D, NP   phenytoin (DILANTIN) 125 MG/5ML suspension 100 mg, 100 mg, Oral, TID, Harris, Whitney D, NP, 100 mg at 08/15/21 1546   [START ON 08/16/2021] polyethylene glycol (MIRALAX / GLYCOLAX) packet 17 g, 17 g, Oral, Daily, Harris, Whitney D, NP  EEG 08/14/21 Lateralized periodic discharges, right hemisphere, maximal right temporal region. Continuous slow, generalized . This study initially showed evidence of epileptogenicity and cortical dysfunction arising from right hemisphere, maximal right temporal region likely secondary to underlying structural abnormality, seizure. Propofol was started at around 1820 on 08/13/2021  after which LPDs were not seen. There was also moderate to severe diffuse encephalopathy, nonspecific etiology but likely related to sedation. No seizures were seen throughout the recording.  CT Head 08/13/21.  No evidence of acute large vascular territory infarct or acute hemorrhage. ASPECTS is 10. Large remote right MCA territory infarcts.  MRI Brain  No acute intracranial abnormality. Right MCA territory encephalomalacia with chronic blood products.  Assessment: 85 yo male who is  admitted with status epilepticus. He was started on Dilantin and propofol, intubated and admitted to the ICU. CTH negative for acute. cEEG with R hemispheric epileptogenicity and cortical dysfunction. MRI Brain with chronic R hemispheric encephalomalacia due to prior R MCA stroke. I suspect that his old R MCA stroke was likely the seizure focus.   His propofol has been weaned off. He was restarted on eliquis, which interacts with dilantin. Will transition to keppra.  Impression: -Status epilepticus, resolved.  -New onset seizure disorder.  -Recent history of PEs. 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   Recommendations/Plan: -Continue seizure precautions. -S/p 40 mg/kg load keppra, cont 1068m bid - Wean dilantin per pharmacy consult - EEG d/c'd  CSu Monks MD [facility] [phone] If 7pm- 7am, please page neurology on call as listed in [place]

## 2021-08-16 DIAGNOSIS — G40901 Epilepsy, unspecified, not intractable, with status epilepticus: Secondary | ICD-10-CM | POA: Diagnosis not present

## 2021-08-16 MED ORDER — LEVETIRACETAM 500 MG PO TABS
1000.0000 mg | ORAL_TABLET | Freq: Two times a day (BID) | ORAL | Status: DC
  Administered 2021-08-16 – 2021-08-23 (×14): 1000 mg via ORAL
  Filled 2021-08-16 (×14): qty 2

## 2021-08-16 NOTE — Progress Notes (Signed)
 NAME:  Travis Gammel Jr., MRN:  [id number], DOB:  10/08/1936, LOS: 3 ADMISSION DATE:  08/13/2021, CONSULTATION DATE:  8/29 REFERRING MD:  Dr. Goldston EDP, CHIEF COMPLAINT:  Seizure   History of Present Illness:  85 year old male with PMH as below, which is significant for CVA of the R MCA, PE on Eliquis, and HTN. Last known well 8/29 at approximately 1345 before developing left sided weakness affecting the left arm and left side of his face. EMS was dispatched and upon arrival to the ED he was seen to have facial twitching on the left. He was sent for emergent CT of the head, which was non-acute. Workup more consistent with seizure, which was treated with 4mg total of Ativan and a Fosphenytoin load. He did have some hypoxia after sz treatment, which was well treated with supplemental oxygen. PCCM asked to evaluate.   Pertinent  Medical History  Hyperlipidemia Hypertension Stroke Prostate cancer post prostatectomy Former smoker quite in 2010   Significant Hospital Events:   8/29 admitted, started on LTM and intubated  Micro COVID 8/29 negative  MRSA PCR 8/29 negative   ABX None  Imaging CT head 8/29: no evidence of hemorrhage or infarct. Large remote right MCA infarct.  MRI Brain 8/30 Right MCA territory encephalomalacia with chronic blood products.  Interim History / Subjective:  Patient tolerated spontaneous breathing trial, was successfully extubated.  EEG was discontinued  Objective   Blood pressure 110/69, pulse 79, temperature 98.5 F (36.9 C), temperature source Oral, resp. rate 15, height 5' 8" (1.727 m), weight 89.5 kg, SpO2 100 %.        Intake/Output Summary (Last 24 hours) at 08/16/2021 0905 Last data filed at 08/16/2021 0600 Gross per 24 hour  Intake 930.14 ml  Output 970 ml  Net -39.86 ml   Filed Weights   08/13/21 1600 08/14/21 0415  Weight: 90.2 kg 89.5 kg    Examination: General: Acute on chronically ill appearing elderly male, lying in the bed HEENT:  Atraumatic, normocephalic, moist mucous membranes Neuro: Opens eyes with vocal stimuli, following simple commands CV: s1s2 regular rate and rhythm, no murmur, rubs, or gallops,  PULM:  Clear to ascultation no increased work of breathing, tolerating vent  GI: soft, bowel sounds active in all 4 quadrants, non-tender, non-distended, tolerating TF Extremities: warm/dry, no edema  Skin: no rashes or lesions  Resolved Hospital Problem list     Assessment & Plan:  Acute encephalopathy due to seizures, improved Status epilepticus, resolved New onset seizure with prior history of right MCA stroke Patient initially presented with a status epilepticus Seizures have resolved Initially he was on phenytoin, yesterday it was switched to Keppra due to being on Eliquis Neurology follow-up is appreciated Seizure precautions  Aspirations precautions  Continue ASA and statin  Acute hypoxic respiratory failure, resolved Successfully extubated yesterday Patient is on room air  Pulmonary Embolism Diagnosed in 05/2021, in setting of likely RUL lung cancer for which he has previously declined further workup.  Chronically anticoagulated with Eliquis  RUL mass Suspected lung cancer (growing pet avid mass seen on 05/2021 cta chest) but had previously declined further workup/been lost to follow up. Patient met with oncology and cardiothoracic surgery january and February of 2021 and decline lung resection or any treatment at that time  Outpatient follow up if patient desires   HTN Home medications include Norvasc, hydralazine, and HCTZ   Best Practice    Diet/type: tubefeeds DVT prophylaxis: SCD GI prophylaxis: H2B Lines: N/A   Foley:  N/A Code Status:  full code Last date of multidisciplinary goals of care discussion [Updated daughter and wife at bedside 8/29 ]     Sudham Chand MD [facility] See Amion for pager If no response to pager, please call [phone] until 7pm After  7pm, Please call E-link [phone]       

## 2021-08-16 NOTE — Progress Notes (Addendum)
Spoke with Dr. Tacy Learn in person and made MD aware that patient is hallucinating and reaching for objects that are not there. Made MD aware that patient trying to climb over bed rail to reach for the objects he sees that are not there. Patient alert to person, reoriented to place, alert to situation after reoriented to place. Denies pain. RN concerned patient will fall because he continues to try to reach over bed rail for object. Also made MD aware that patient had benadryl last night. MD gave verbal order for soft waist belt restraint.

## 2021-08-16 NOTE — TOC Initial Note (Signed)
Transition of Care (TOC) - Initial/Assessment Note    Patient Details  Name: Travis Salazar. MRN: [id number] Date of Birth: 12-06-1936  Transition of Care [facility]) CM/SW Contact:    Verdell Carmine, RN Phone Number: 08/16/2021, 4:17 PM  Clinical Narrative:                  85 year old patient from home with spouse.  Has a history of CVA . Symptoms of one sided weakness, but observed siezure like activity, started on dilantin and benzodiazepines. Received benedryl last night. Today exhibiting hallucinations and frequent restlessness, necessitating restraints.  Patient will likely need SNF post hospitalization, PT and OT to evaluate when medically stable to do so.  CM w/CSW  will follow the patient for needs progressions and transitions.  Expected Discharge Plan: [place] Barriers to Discharge: Continued Medical Work up   Patient Goals and CMS Choice        Expected Discharge Plan and Services Expected Discharge Plan: [place] In-house Referral: Clinical Social Work     Living arrangements for the past 2 months: [place]                                      Prior Living Arrangements/Services Living arrangements for the past 2 months: Single Family Home Lives with:: Spouse Patient language and need for interpreter reviewed:: Yes        Need for Family Participation in Patient Care: Yes (Comment) Care giver support system in place?: Yes (comment)   Criminal Activity/Legal Involvement Pertinent to Current Situation/Hospitalization: No - Comment as needed  Activities of Daily Living      Permission Sought/Granted                  Emotional Assessment     Affect (typically observed): Restless   Alcohol / Substance Use: Not Applicable Psych Involvement: No (comment)  Admission diagnosis:  Status epilepticus ([place]) [G40.901] Acute respiratory failure with hypoxia ([place]) [J96.01] History of ETT [Z92.89] Patient  Active Problem List   Diagnosis Date Noted   Status epilepticus ([place]) 08/13/2021   Acute pulmonary embolism without acute cor pulmonale ([place]) 05/27/2021   Demand ischemia ([place]) 05/27/2021   CAD (coronary artery disease) 05/27/2021   Emphysema lung ([place]) 05/27/2021   Acute left hemiparesis ([place]) 04/30/2020   Paroxysmal supraventricular tachycardia ([place]) 04/30/2020   Pulmonary mass 01/07/2020   CVA (cerebral vascular accident) ([place]) 06/26/2019   Wrist swelling, right 06/03/2017   S/P prostatectomy 10/08/2016   Snores 10/08/2016   Essential (primary) hypertension 09/27/2015   History of tobacco use 09/27/2015   Hypercholesterolemia 09/27/2015   Blood glucose elevated 09/27/2015   Adiposity 09/27/2015   PCP:  [name], MD Pharmacy:  No Pharmacies Listed    Social Determinants of Health (SDOH) Interventions    Readmission Risk Interventions No flowsheet data found.

## 2021-08-16 NOTE — Plan of Care (Signed)
Neurology Plan of Care  Please see detailed progress note from yesterday. Per pharmacy, phenytoin can be discontinued without a taper since her has been on it <48 hrs. Continue keppra '1000mg'$  IV bid (can change to po when able to safely swallow). He should be discharged on this dose and f/u with his outpatient neurologist. If he does not have one, place ambulatory referral to neurology in discharge orders.  No further inpatient neurologic workup indicated at this time. Neurology to sign off, but please re-engage if new neurologic questions arise.  Su Monks, MD [facility] [phone]  If 7pm- 7am, please page neurology on call as listed in [place].

## 2021-08-17 DIAGNOSIS — R918 Other nonspecific abnormal finding of lung field: Secondary | ICD-10-CM

## 2021-08-17 DIAGNOSIS — I10 Essential (primary) hypertension: Secondary | ICD-10-CM

## 2021-08-17 NOTE — Progress Notes (Signed)
Patient unable to void. Bladder scan performed, and a total of 460 ml of urine scanned. In and out cath performed, and a total of 600 ml of urine drained. Post In and Out bladder scan performed, and a total of 0 ml residual scanned.

## 2021-08-17 NOTE — Evaluation (Signed)
Physical Therapy Evaluation Patient Details Name: Travis Salazar. MRN: [id number] DOB: 1936/11/18 Today's Date: 08/17/2021   History of Present Illness  Pt is an 85 year old male admitted 8/29 for status epilepticus, new onset seizure disorder. Intubated 8/29-8/31. MRI negative for acute abnormality. PMH: R MCA stroke, PE on Eliquis, HTN, RUL mass (suspect lung CA)   Clinical Impression  Pt admitted with above diagnosis. PTA pt lived at home with family, independent mobility, ADLs, and driving. On eval, pt required mod assist bed mobility, +2 mod assist transfers, and +2 mod assist ambulation 25' with RW. He demonstrates deficits in balance, strength, and safety awareness. Pt currently with functional limitations due to the deficits listed below (see PT Problem List). Pt will benefit from skilled PT to increase their independence and safety with mobility to allow discharge to the venue listed below.       Follow Up Recommendations CIR    Equipment Recommendations  Other (comment) (TBD)    Recommendations for Other Services       Precautions / Restrictions Precautions Precautions: Fall      Mobility  Bed Mobility Overal bed mobility: Needs Assistance Bed Mobility: Supine to Sit     Supine to sit: Mod assist;HOB elevated     General bed mobility comments: +rail, increased time, cues for sequencing    Transfers Overall transfer level: Needs assistance Equipment used: 2 person hand held assist;Rolling walker (2 wheeled) Transfers: Sit to/from Stand Sit to Stand: +2 physical assistance;Mod assist         General transfer comment: retropulsive with initial stance. Multi modal cues for anterior translation of weight.  Ambulation/Gait Ambulation/Gait assistance: Mod assist;+2 physical assistance;+2 safety/equipment Gait Distance (Feet): 25 Feet Assistive device: Rolling walker (2 wheeled) Gait Pattern/deviations: Step-through pattern;Decreased stride length;Staggering  right;Trunk flexed;Decreased step length - left Gait velocity: decreased Gait velocity interpretation: <1.8 ft/sec, indicate of risk for recurrent falls General Gait Details: +2 assist to maintain balance and manage RW. Increased difficulty with turns requiring assist to maintain midline, heavy R lean while pivoting L. Cues/assist to stay close to RW. Also ambulated 5' +2 HH/mod assist with pt demonstrating heavy posterior lean.  Stairs            Wheelchair Mobility    Modified Rankin (Stroke Patients Only)       Balance Overall balance assessment: Needs assistance Sitting-balance support: Feet supported;Bilateral upper extremity supported Sitting balance-Leahy Scale: Fair     Standing balance support: Bilateral upper extremity supported;During functional activity Standing balance-Leahy Scale: Poor Standing balance comment: reliant on external support                             Pertinent Vitals/Pain Pain Assessment: No/denies pain    Home Living Family/patient expects to be discharged to:: Private residence Living Arrangements: Spouse/significant other;Children Available Help at Discharge: Family;Available 24 hours/day Type of Home: House Home Access: Stairs to enter Entrance Stairs-Rails: None Entrance Stairs-Number of Steps: 1 (threshold) Home Layout: One level Home Equipment: Shower seat;Hand held shower head;Cane - single point      Prior Function Level of Independence: Independent         Comments: Drives. Does the grocery shopping and cooking.     Hand Dominance        Extremity/Trunk Assessment   Upper Extremity Assessment Upper Extremity Assessment: Defer to OT evaluation    Lower Extremity Assessment Lower Extremity Assessment: LLE deficits/detail;Generalized weakness LLE Deficits / Details:  residual weakness from previous CVA LLE Sensation: decreased proprioception    Cervical / Trunk Assessment Cervical / Trunk  Assessment: Kyphotic  Communication   Communication: No difficulties  Cognition Arousal/Alertness: Awake/alert Behavior During Therapy: WFL for tasks assessed/performed Overall Cognitive Status: Impaired/Different from baseline Area of Impairment: Safety/judgement;Problem solving;Awareness                         Safety/Judgement: Decreased awareness of safety;Decreased awareness of deficits Awareness: Emergent Problem Solving: Difficulty sequencing;Requires verbal cues General Comments: After ambulating in room with therapy, pt asked "do you think you could have done that by herself without falling?" Pt replied "yes, I think I could have done it by myself."      General Comments General comments (skin integrity, edema, etc.): VSS on RA. Visual perceptual deficits resulting in over/under-reaching for objects (i.e. RW, recliner, bedside table).    Exercises     Assessment/Plan    PT Assessment Patient needs continued PT services  PT Problem List Decreased strength;Decreased mobility;Decreased safety awareness;Decreased coordination;Decreased knowledge of precautions;Decreased activity tolerance;Decreased cognition;Decreased balance;Decreased knowledge of use of DME       PT Treatment Interventions DME instruction;Therapeutic activities;Cognitive remediation;Gait training;Therapeutic exercise;Patient/family education;Balance training;Functional mobility training;Neuromuscular re-education    PT Goals (Current goals can be found in the Care Plan section)  Acute Rehab PT Goals Patient Stated Goal: home PT Goal Formulation: With patient Time For Goal Achievement: 08/31/21 Potential to Achieve Goals: Good    Frequency Min 4X/week   Barriers to discharge        Co-evaluation               AM-PAC PT "6 Clicks" Mobility  Outcome Measure Help needed turning from your back to your side while in a flat bed without using bedrails?: A Little Help needed moving from  lying on your back to sitting on the side of a flat bed without using bedrails?: A Lot Help needed moving to and from a bed to a chair (including a wheelchair)?: A Lot Help needed standing up from a chair using your arms (e.g., wheelchair or bedside chair)?: A Lot Help needed to walk in hospital room?: Total Help needed climbing 3-5 steps with a railing? : Total 6 Click Score: 11    End of Session Equipment Utilized During Treatment: Gait belt Activity Tolerance: Patient tolerated treatment well Patient left: in chair;with call bell/phone within reach;with chair alarm set (plus posey alarm belt) Nurse Communication: Mobility status PT Visit Diagnosis: Unsteadiness on feet (R26.81);Other abnormalities of gait and mobility (R26.89);Muscle weakness (generalized) (M62.81)    Time: [id number] PT Time Calculation (min) (ACUTE ONLY): 24 min   Charges:   PT Evaluation $PT Eval Moderate Complexity: 1 Mod PT Treatments $Gait Training: 8-22 mins        Lorrin Goodell, PT  Office # [phone] Pager [phone]   Lorriane Shire 08/17/2021, 1:10 PM

## 2021-08-17 NOTE — Progress Notes (Signed)
Inpatient Rehab Admissions Coordinator Note:  Per PT recommendations, patient was screened for CIR candidacy by Michel Santee, PT. At this time, pt does not appear to demonstrate medical necessity to support a CIR admission.  We will not pursue a rehab consult at this time.  Recommend other rehab venues to be pursued.  Please contact me with any questions.  Michel Santee, [place] [phone] '@TODAY'$ @ 1:28 PM

## 2021-08-17 NOTE — Progress Notes (Signed)
PROGRESS NOTE    Travis Salazar.  BQ:[id number] DOB: 29-Dec-1935 DOA: 08/13/2021 PCP: [name], MD   Brief Narrative: Travis Northam. is a 85 y.o. male with history of CVA, PE on Eliquis, hypertension.  Patient presented secondary to left-sided weakness initial [place] concerning for CVA but was found to have evidence of seizures and was in status epilepticus.  Patient was intubated secondary to inability to protect his airway.  Patient received Precedex, Keppra, Dilantin to control seizures.  Neurology was consulted and patient underwent LTM EEG.  Patient extubated successfully and seizures are controlled on Keppra.   Assessment & Plan:   Active Problems:   Essential (primary) hypertension   Pulmonary mass   Status epilepticus ([place])  Status epilepticus Present on admission. Neurology consulted. Patient required ICU admission secondary to inability to protect airway and respiratory failure. Patient initially started on Precedex, Keppra 40 mg/kg load and dilantin. LTM EEG significant for epileptogenicity arising from right hemisphere. Seizure likely secondary to prior stroke history. Dilantin discontinued secondary to drug interaction with Eliquis. Neurology signed off. -Continue Keppra 1000 mg BID; outpatient neurology follow-up  Acute metabolic encephalopathy Secondary to seizure/status epilepticus. Resolved. -PT eval  Acute respiratory failure with hypoxia Patient required intubation on 8/29. He was extubated successfully on 8/31 and is now on room air. Resolved.  History of pulmonary embolism -Continue Eliquis  RUL mass Concern for cancer but patient has decided on not pursue diagnosis. Follow-up as an outpatient.  Primary hypertension -Continue amlodipine, hydralazine, hydrochlorothiazide  Acute urinary retention Foley catheter placed on 8/31 -Discontinue foley catheter -Voiding trial; bladder scan q4 hours   DVT prophylaxis: Eliquis Code Status:   Code  Status: Full Code Family Communication: Wife on telephone Disposition Plan: Discharge home possibly in 24 hours pending PT recommendations   Consultants:  PCCM Neurology  Procedures:  EEG (08/13/2021)  IMPRESSION: This study howed evidence of epileptogenicity and cortical dysfunction arising from right hemisphere, maximal right temporal region likely secondary to underlying structural abnormality, seizure. Of note, lateralized periodic discharges are on the ictal-interictal continuum with increased potential for seizure recurrence. There is also moderate to severe diffuse encephalopathy, nonspecific etiology but likely related to seizure. No seizures were seen throughout the recording.   EEG (08/13/2021 - 08/14/2021)  IMPRESSION: This study initially showed evidence of epileptogenicity and cortical dysfunction arising from right hemisphere, maximal right temporal region likely secondary to underlying structural abnormality, seizure.  There was also moderate to severe diffuse encephalopathy, nonspecific etiology but likely related to sedation. No seizures were seen throughout the recording.   EEG (08/14/2021 - 08/15/2021)  IMPRESSION: This study showed evidence of epileptogenicity arising from right temporal region. No seizures were seen throughout the recording.   EEG appears to be improving compared to previous day  Antimicrobials: None    Subjective: No concern this morning.   Objective: Vitals:   08/17/21 0900 08/17/21 1000 08/17/21 1100 08/17/21 1200  BP:  (!) 145/102 (!) 148/87 (!) 150/91  Pulse: (!) 104 100 94 88  Resp: (!) '22 16 19 17  '$ Temp:    99.6 F (37.6 C)  TempSrc:    Axillary  SpO2: 96% 100% 100% 94%  Weight:      Height:        Intake/Output Summary (Last 24 hours) at 08/17/2021 1253 Last data filed at 08/17/2021 1000 Gross per 24 hour  Intake 360 ml  Output 1035 ml  Net -675 ml   Filed Weights   08/13/21 1600  08/14/21 0415  Weight: 90.2 kg 89.5 kg     Examination:  General exam: Appears calm and comfortable Respiratory system: Clear to auscultation. Respiratory effort normal. Cardiovascular system: S1 & S2 heard, RRR. No murmurs, rubs, gallops or clicks. Gastrointestinal system: Abdomen is nondistended, soft and nontender. No organomegaly or masses felt. Normal bowel sounds heard. Central nervous system: Alert and oriented. No focal neurological deficits. Musculoskeletal: No edema. No calf tenderness Skin: No cyanosis. No rashes Psychiatry: Judgement and insight appear normal. Mood & affect appropriate.     Data Reviewed: I have personally reviewed following labs and imaging studies  CBC Lab Results  Component Value Date   WBC 9.3 08/15/2021   RBC 4.04 (L) 08/15/2021   HGB 12.1 (L) 08/15/2021   HCT 36.9 (L) 08/15/2021   MCV 91.3 08/15/2021   MCH 30.0 08/15/2021   PLT 106 (L) 08/15/2021   MCHC 32.8 08/15/2021   RDW 14.6 08/15/2021   LYMPHSABS 2.8 08/13/2021   MONOABS 0.6 08/13/2021   EOSABS 0.1 08/13/2021   BASOSABS 0.1 99991111     Last metabolic panel Lab Results  Component Value Date   NA 135 08/15/2021   K 3.7 08/15/2021   CL 103 08/15/2021   CO2 24 08/15/2021   BUN 19 08/15/2021   CREATININE 0.77 08/15/2021   GLUCOSE 119 (H) 08/15/2021   GFRNONAA >60 08/15/2021   GFRAA 86 08/29/2020   CALCIUM 9.3 08/15/2021   PHOS 4.3 08/14/2021   PROT 7.1 08/13/2021   ALBUMIN 3.6 08/13/2021   LABGLOB 2.6 08/29/2020   AGRATIO 1.8 08/29/2020   BILITOT 0.8 08/13/2021   ALKPHOS 46 08/13/2021   AST 22 08/13/2021   ALT 13 08/13/2021   ANIONGAP 8 08/15/2021    CBG (last 3)  No results for input(s): GLUCAP in the last 72 hours.   GFR: Estimated Creatinine Clearance: 77.3 mL/min (by C-G formula based on SCr of 0.77 mg/dL).  Coagulation Profile: Recent Labs  Lab 08/13/21 1600  INR 1.0    Recent Results (from the past 240 hour(s))  Resp Panel by RT-PCR (Flu A&B, Covid) Nasopharyngeal Swab     Status: None    Collection Time: 08/13/21  4:14 PM   Specimen: Nasopharyngeal Swab; Nasopharyngeal(NP) swabs in vial transport medium  Result Value Ref Range Status   SARS Coronavirus 2 by RT PCR NEGATIVE NEGATIVE Final    Comment: (NOTE) SARS-CoV-2 target nucleic acids are NOT DETECTED.  The SARS-CoV-2 RNA is generally detectable in upper respiratory specimens during the acute phase of infection. The lowest concentration of SARS-CoV-2 viral copies this assay can detect is 138 copies/mL. A negative result does not preclude SARS-Cov-2 infection and should not be used as the sole basis for treatment or other patient management decisions. A negative result may occur with  improper specimen collection/handling, submission of specimen other than nasopharyngeal swab, presence of viral mutation(s) within the areas targeted by this assay, and inadequate number of viral copies(<138 copies/mL). A negative result must be combined with clinical observations, patient history, and epidemiological information. The expected result is Negative.  Fact Sheet for Patients:  EntrepreneurPulse.com.au  Fact Sheet for Healthcare Providers:  IncredibleEmployment.be  This test is no t yet approved or cleared by the [place] FDA and  has been authorized for detection and/or diagnosis of SARS-CoV-2 by FDA under an Emergency Use Authorization (EUA). This EUA will remain  in effect (meaning this test can be used) for the duration of the COVID-19 declaration under Section 564(b)(1) of the Act,  21 U.S.C.section 360bbb-3(b)(1), unless the authorization is terminated  or revoked sooner.       Influenza A by PCR NEGATIVE NEGATIVE Final   Influenza B by PCR NEGATIVE NEGATIVE Final    Comment: (NOTE) The Xpert Xpress SARS-CoV-2/FLU/RSV plus assay is intended as an aid in the diagnosis of influenza from Nasopharyngeal swab specimens and should not be used as a sole basis for treatment.  Nasal washings and aspirates are unacceptable for Xpert Xpress SARS-CoV-2/FLU/RSV testing.  Fact Sheet for Patients: EntrepreneurPulse.com.au  Fact Sheet for Healthcare Providers: IncredibleEmployment.be  This test is not yet approved or cleared by the [place] FDA and has been authorized for detection and/or diagnosis of SARS-CoV-2 by FDA under an Emergency Use Authorization (EUA). This EUA will remain in effect (meaning this test can be used) for the duration of the COVID-19 declaration under Section 564(b)(1) of the Act, 21 U.S.C. section 360bbb-3(b)(1), unless the authorization is terminated or revoked.  Performed at [facility], [place] [address] [place], [place] [address]   MRSA Next Gen by PCR, Nasal     Status: None   Collection Time: 08/13/21 10:19 PM   Specimen: Nasal Mucosa; Nasal Swab  Result Value Ref Range Status   MRSA by PCR Next Gen NOT DETECTED NOT DETECTED Final    Comment: (NOTE) The GeneXpert MRSA Assay (FDA approved for NASAL specimens only), is one component of a comprehensive MRSA colonization surveillance program. It is not intended to diagnose MRSA infection nor to guide or monitor treatment for MRSA infections. Test performance is not FDA approved in patients less than 22 years old. Performed at [facility], [place] [address] [place], [place] [address]         Radiology Studies: No results found.      Scheduled Meds:  amLODipine  10 mg Oral Daily   apixaban  5 mg Oral BID   atorvastatin  40 mg Oral q1800   bethanechol  10 mg Oral TID   chlorhexidine  15 mL Mouth Rinse BID   Chlorhexidine Gluconate Cloth  6 each Topical Daily   docusate  100 mg Oral BID   levETIRAcetam  1,000 mg Oral BID   mouth rinse  15 mL Mouth Rinse q12n4p   metoprolol tartrate  12.5 mg Oral BID   polyethylene glycol  17 g Oral Daily   Continuous Infusions:   LOS: 4 days     Cordelia Poche, MD [facility] 08/17/2021, 12:53 PM  If 7PM-7AM, please contact night-coverage [email or web address]

## 2021-08-18 DIAGNOSIS — R338 Other retention of urine: Secondary | ICD-10-CM

## 2021-08-18 HISTORY — DX: Other retention of urine: R33.8

## 2021-08-18 LAB — URINALYSIS, ROUTINE W REFLEX MICROSCOPIC
Bilirubin Urine: NEGATIVE
Glucose, UA: NEGATIVE mg/dL
Ketones, ur: NEGATIVE mg/dL
Leukocytes,Ua: NEGATIVE
Nitrite: NEGATIVE
Protein, ur: NEGATIVE mg/dL
Specific Gravity, Urine: 1.018 (ref 1.005–1.030)
pH: 6 (ref 5.0–8.0)

## 2021-08-18 MED ORDER — TAMSULOSIN HCL 0.4 MG PO CAPS
0.4000 mg | ORAL_CAPSULE | Freq: Every day | ORAL | Status: DC
  Administered 2021-08-18 – 2021-08-23 (×6): 0.4 mg via ORAL
  Filled 2021-08-18 (×6): qty 1

## 2021-08-18 NOTE — Progress Notes (Signed)
PROGRESS NOTE    Travis Salazar.  BL:[id number] DOB: 07/19/1936 DOA: 08/13/2021 PCP: [name], MD   Brief Narrative: Travis Salazar. is a 85 y.o. male with history of CVA, PE on Eliquis, hypertension.  Patient presented secondary to left-sided weakness initial [place] concerning for CVA but was found to have evidence of seizures and was in status epilepticus.  Patient was intubated secondary to inability to protect his airway.  Patient received Precedex, Keppra, Dilantin to control seizures.  Neurology was consulted and patient underwent LTM EEG.  Patient extubated successfully and seizures are controlled on Keppra.   Assessment & Plan:   Principal Problem:   Status epilepticus ([place]) Active Problems:   Essential (primary) hypertension   Pulmonary mass   Acute urinary retention  Status epilepticus Present on admission. Neurology consulted. Patient required ICU admission secondary to inability to protect airway and respiratory failure. Patient initially started on Precedex, Keppra 40 mg/kg load and dilantin. LTM EEG significant for epileptogenicity arising from right hemisphere. Seizure likely secondary to prior stroke history. Dilantin discontinued secondary to drug interaction with Eliquis. Neurology signed off. -Continue Keppra 1000 mg BID; outpatient neurology follow-up  Acute metabolic encephalopathy Secondary to seizure/status epilepticus. Resolved.  Acute respiratory failure with hypoxia Patient required intubation on 8/29. He was extubated successfully on 8/31 and is now on room air. Resolved.  History of pulmonary embolism -Continue Eliquis  RUL mass Concern for cancer but patient has decided on not pursue diagnosis. Follow-up as an outpatient.  Primary hypertension -Continue amlodipine, hydralazine, hydrochlorothiazide  Acute urinary retention Foley catheter placed on 8/31 and removed on 9/2. Patient has been having issues with voiding -Continue bladder  scan -Flomax -Urinalysis/culture   DVT prophylaxis: Eliquis Code Status:   Code Status: Full Code Family Communication: Wife and daughter on telephone Disposition Plan: Discharge to SNF pending bed availability    Consultants:  PCCM Neurology  Procedures:  EEG (08/13/2021)  IMPRESSION: This study howed evidence of epileptogenicity and cortical dysfunction arising from right hemisphere, maximal right temporal region likely secondary to underlying structural abnormality, seizure. Of note, lateralized periodic discharges are on the ictal-interictal continuum with increased potential for seizure recurrence. There is also moderate to severe diffuse encephalopathy, nonspecific etiology but likely related to seizure. No seizures were seen throughout the recording.   EEG (08/13/2021 - 08/14/2021)  IMPRESSION: This study initially showed evidence of epileptogenicity and cortical dysfunction arising from right hemisphere, maximal right temporal region likely secondary to underlying structural abnormality, seizure.  There was also moderate to severe diffuse encephalopathy, nonspecific etiology but likely related to sedation. No seizures were seen throughout the recording.   EEG (08/14/2021 - 08/15/2021)  IMPRESSION: This study showed evidence of epileptogenicity arising from right temporal region. No seizures were seen throughout the recording.   EEG appears to be improving compared to previous day  Antimicrobials: None    Subjective: No issues overnight. No apparent seizures. Having issues with voiding. Required in out catheterization.  Objective: Vitals:   08/18/21 0027 08/18/21 0345 08/18/21 0500 08/18/21 0800  BP: 132/78 129/86  140/70  Pulse: 86 88  85  Resp: '18 20  16  '$ Temp: 99.7 F (37.6 C) 99 F (37.2 C)  98.7 F (37.1 C)  TempSrc: Oral Oral  Oral  SpO2:  96%    Weight:   89.5 kg   Height:        Intake/Output Summary (Last 24 hours) at 08/18/2021 0956 Last data filed  at 08/18/2021 0400  Gross per 24 hour  Intake 240 ml  Output 1550 ml  Net -1310 ml    Filed Weights   08/13/21 1600 08/14/21 0415 08/18/21 0500  Weight: 90.2 kg 89.5 kg 89.5 kg    Examination:  General exam: Appears calm and comfortable Respiratory system: Clear to auscultation. Respiratory effort normal. Cardiovascular system: S1 & S2 heard, RRR. No murmurs, rubs, gallops or clicks. Gastrointestinal system: Abdomen is nondistended, soft and nontender. No organomegaly or masses felt. Normal bowel sounds heard. Central nervous system: Alert and oriented. No focal neurological deficits. Musculoskeletal: No edema. No calf tenderness Skin: No cyanosis. No rashes Psychiatry: Judgement and insight appear normal. Mood & affect appropriate.     Data Reviewed: I have personally reviewed following labs and imaging studies  CBC Lab Results  Component Value Date   WBC 9.3 08/15/2021   RBC 4.04 (L) 08/15/2021   HGB 12.1 (L) 08/15/2021   HCT 36.9 (L) 08/15/2021   MCV 91.3 08/15/2021   MCH 30.0 08/15/2021   PLT 106 (L) 08/15/2021   MCHC 32.8 08/15/2021   RDW 14.6 08/15/2021   LYMPHSABS 2.8 08/13/2021   MONOABS 0.6 08/13/2021   EOSABS 0.1 08/13/2021   BASOSABS 0.1 99991111     Last metabolic panel Lab Results  Component Value Date   NA 135 08/15/2021   K 3.7 08/15/2021   CL 103 08/15/2021   CO2 24 08/15/2021   BUN 19 08/15/2021   CREATININE 0.77 08/15/2021   GLUCOSE 119 (H) 08/15/2021   GFRNONAA >60 08/15/2021   GFRAA 86 08/29/2020   CALCIUM 9.3 08/15/2021   PHOS 4.3 08/14/2021   PROT 7.1 08/13/2021   ALBUMIN 3.6 08/13/2021   LABGLOB 2.6 08/29/2020   AGRATIO 1.8 08/29/2020   BILITOT 0.8 08/13/2021   ALKPHOS 46 08/13/2021   AST 22 08/13/2021   ALT 13 08/13/2021   ANIONGAP 8 08/15/2021    CBG (last 3)  No results for input(s): GLUCAP in the last 72 hours.   GFR: Estimated Creatinine Clearance: 77.3 mL/min (by C-G formula based on SCr of 0.77  mg/dL).  Coagulation Profile: Recent Labs  Lab 08/13/21 1600  INR 1.0     Recent Results (from the past 240 hour(s))  Resp Panel by RT-PCR (Flu A&B, Covid) Nasopharyngeal Swab     Status: None   Collection Time: 08/13/21  4:14 PM   Specimen: Nasopharyngeal Swab; Nasopharyngeal(NP) swabs in vial transport medium  Result Value Ref Range Status   SARS Coronavirus 2 by RT PCR NEGATIVE NEGATIVE Final    Comment: (NOTE) SARS-CoV-2 target nucleic acids are NOT DETECTED.  The SARS-CoV-2 RNA is generally detectable in upper respiratory specimens during the acute phase of infection. The lowest concentration of SARS-CoV-2 viral copies this assay can detect is 138 copies/mL. A negative result does not preclude SARS-Cov-2 infection and should not be used as the sole basis for treatment or other patient management decisions. A negative result may occur with  improper specimen collection/handling, submission of specimen other than nasopharyngeal swab, presence of viral mutation(s) within the areas targeted by this assay, and inadequate number of viral copies(<138 copies/mL). A negative result must be combined with clinical observations, patient history, and epidemiological information. The expected result is Negative.  Fact Sheet for Patients:  EntrepreneurPulse.com.au  Fact Sheet for Healthcare Providers:  IncredibleEmployment.be  This test is no t yet approved or cleared by the [place] FDA and  has been authorized for detection and/or diagnosis of SARS-CoV-2 by FDA under an Emergency  Use Authorization (EUA). This EUA will remain  in effect (meaning this test can be used) for the duration of the COVID-19 declaration under Section 564(b)(1) of the Act, 21 U.S.C.section 360bbb-3(b)(1), unless the authorization is terminated  or revoked sooner.       Influenza A by PCR NEGATIVE NEGATIVE Final   Influenza B by PCR NEGATIVE NEGATIVE Final     Comment: (NOTE) The Xpert Xpress SARS-CoV-2/FLU/RSV plus assay is intended as an aid in the diagnosis of influenza from Nasopharyngeal swab specimens and should not be used as a sole basis for treatment. Nasal washings and aspirates are unacceptable for Xpert Xpress SARS-CoV-2/FLU/RSV testing.  Fact Sheet for Patients: EntrepreneurPulse.com.au  Fact Sheet for Healthcare Providers: IncredibleEmployment.be  This test is not yet approved or cleared by the [place] FDA and has been authorized for detection and/or diagnosis of SARS-CoV-2 by FDA under an Emergency Use Authorization (EUA). This EUA will remain in effect (meaning this test can be used) for the duration of the COVID-19 declaration under Section 564(b)(1) of the Act, 21 U.S.C. section 360bbb-3(b)(1), unless the authorization is terminated or revoked.  Performed at [facility], [place] [address] [place], [place] [address]   MRSA Next Gen by PCR, Nasal     Status: None   Collection Time: 08/13/21 10:19 PM   Specimen: Nasal Mucosa; Nasal Swab  Result Value Ref Range Status   MRSA by PCR Next Gen NOT DETECTED NOT DETECTED Final    Comment: (NOTE) The GeneXpert MRSA Assay (FDA approved for NASAL specimens only), is one component of a comprehensive MRSA colonization surveillance program. It is not intended to diagnose MRSA infection nor to guide or monitor treatment for MRSA infections. Test performance is not FDA approved in patients less than 80 years old. Performed at [facility], [place] [address], [place], [place] [address]          Radiology Studies: No results found.      Scheduled Meds:  amLODipine  10 mg Oral Daily   apixaban  5 mg Oral BID   atorvastatin  40 mg Oral q1800   bethanechol  10 mg Oral TID   chlorhexidine  15 mL Mouth Rinse BID   Chlorhexidine Gluconate Cloth  6 each Topical Daily   docusate  100 mg Oral BID   levETIRAcetam  1,000 mg  Oral BID   mouth rinse  15 mL Mouth Rinse q12n4p   metoprolol tartrate  12.5 mg Oral BID   polyethylene glycol  17 g Oral Daily   Continuous Infusions:   LOS: 5 days     Cordelia Poche, MD [facility] 08/18/2021, 9:56 AM  If 7PM-7AM, please contact night-coverage [email or web address]

## 2021-08-18 NOTE — Evaluation (Signed)
Occupational Therapy Evaluation Patient Details Name: Travis Salazar. MRN: [id number] DOB: 1936/08/15 Today's Date: 08/18/2021    History of Present Illness Pt is an 85 year old male admitted 8/29 for status epilepticus, new onset seizure disorder. Intubated 8/29-8/31. MRI negative for acute abnormality. PMH: R MCA stroke, PE on Eliquis, HTN, RUL mass (suspect lung CA)   Clinical Impression   Pt admitted with above. He demonstrates the below listed deficits and will benefit from continued OT to maximize safety and independence with BADLs.  Pt presents to OT with generalized weakness, impaired balance, poor safety awareness, impaired vision and impaired cognition.  He currently requires min A for ADLs and min A +2 for functional mobility due to impulsivity and risk for falls.  He reports he lives with his wife and son and was fully independent PTA.  Currently recommend SNF level rehab, but anticipate he may progress to home  if he continues to improve and has 24 hour assist at home.      Follow Up Recommendations  Supervision/Assistance - 24 hour;SNF (may be able to progress to home with 24 hour assist)    Equipment Recommendations  Tub/shower seat    Recommendations for Other Services       Precautions / Restrictions Precautions Precautions: Fall Precaution Comments: Pt impulsive with poor safety awareness      Mobility Bed Mobility Overal bed mobility: Needs Assistance Bed Mobility: Supine to Sit;Sit to Supine     Supine to sit: Supervision Sit to supine: Supervision   General bed mobility comments: supervision for safety    Transfers Overall transfer level: Needs assistance Equipment used: 2 person hand held assist;Rolling walker (2 wheeled) Transfers: Sit to/from [facility] Sit to Stand: Min assist;+2 physical assistance;+2 safety/equipment Stand pivot transfers: Min assist;+2 safety/equipment       General transfer comment: Pt very impulsive with  poor safety awareness  - max cues for RW use.  He tends to crowd therapist into the wall on both Rt and Lt sides when ambulating with him    Balance Overall balance assessment: Needs assistance Sitting-balance support: Feet supported;Bilateral upper extremity supported Sitting balance-Leahy Scale: Fair     Standing balance support: Bilateral upper extremity supported;During functional activity Standing balance-Leahy Scale: Poor Standing balance comment: requires UE support                           ADL either performed or assessed with clinical judgement   ADL Overall ADL's : Needs assistance/impaired Eating/Feeding: Set up;Sitting   Grooming: Wash/dry hands;Wash/dry face;Oral care;Brushing hair;Minimal assistance;Standing   Upper Body Bathing: Minimal assistance;Sitting   Lower Body Bathing: Minimal assistance;Sit to/from stand   Upper Body Dressing : Minimal assistance;Sitting   Lower Body Dressing: Minimal assistance;Sit to/from stand Lower Body Dressing Details (indicate cue type and reason): able to don/doff socks.  Requires assist for balance Toilet Transfer: Minimal assistance;+2 for safety/equipment;+2 for physical assistance;Ambulation;Comfort height toilet;RW;Grab bars   Toileting- Clothing Manipulation and Hygiene: Moderate assistance;Sit to/from stand       Functional mobility during ADLs: Minimal assistance;+2 for physical assistance;+2 for safety/equipment       Vision Baseline Vision/History: 1 Wears glasses Ability to See in Adequate Light: 1 Impaired Patient Visual Report: Peripheral vision impairment Additional Comments: Pt reports he has bil. peripheral vision loss, but unable to elaborate as to cause of this and unable to participate in formal visual assessment.  He demonstrates a disorganized scan strategy when attempting to  locate iems thus requiring max cues to locate itsm     Perception Perception Perception Tested?: Yes Perception  Deficits: Topographical orientation   Praxis      Pertinent Vitals/Pain Pain Assessment: No/denies pain     Hand Dominance Right   Extremity/Trunk Assessment Upper Extremity Assessment Upper Extremity Assessment: LUE deficits/detail;RUE deficits/detail;Generalized weakness RUE Deficits / Details: Pt with wasting at anatomical snuff box bil. hands clawing of both hands with Rt worse than LT RUE Coordination: decreased fine motor LUE Deficits / Details: Pt with wasting at anatomical snuff box bil. hands clawing of both hands with Rt worse than LT LUE Coordination: decreased fine motor   Lower Extremity Assessment Lower Extremity Assessment: Defer to PT evaluation   Cervical / Trunk Assessment Cervical / Trunk Assessment: Kyphotic   Communication Communication Communication: No difficulties   Cognition Arousal/Alertness: Awake/alert Behavior During Therapy: WFL for tasks assessed/performed;Impulsive Overall Cognitive Status: Impaired/Different from baseline Area of Impairment: Attention;Following commands;Safety/judgement                   Current Attention Level: Selective   Following Commands: Follows one step commands consistently Safety/Judgement: Decreased awareness of safety;Decreased awareness of deficits Awareness: Emergent Problem Solving: Requires verbal cues;Difficulty sequencing General Comments: Pt very distracted requiring max cues to attend to task.  He demonstrates poor safety awareness. He required total a to locate his room during simple path finding task.   General Comments  VSS    Exercises     Shoulder Instructions      Home Living Family/patient expects to be discharged to:: Private residence Living Arrangements: Spouse/significant other;Children Available Help at Discharge: Family;Available 24 hours/day Type of Home: House Home Access: Stairs to enter [employer] of Steps: 1 (threshold) Entrance Stairs-Rails: None Home  Layout: One level     Bathroom Shower/Tub: Teacher, early years/pre: Standard Bathroom Accessibility: Yes   Home Equipment: Shower seat;Hand held shower head;Cane - single point          Prior Functioning/Environment Level of Independence: Independent        Comments: Pt reports he ambulated occasionally with SPC, but mostly doesn't use a device. He reports he was independent with ADLs PTA.  He reports he drives and enjoys "piddling".  "I don't sit still"        OT Problem List: Decreased strength;Decreased activity tolerance;Impaired balance (sitting and/or standing);Impaired vision/perception;Decreased coordination;Decreased cognition;Decreased safety awareness;Decreased knowledge of use of DME or AE      OT Treatment/Interventions: Self-care/ADL training;DME and/or AE instruction;Therapeutic activities;Cognitive remediation/compensation;Visual/perceptual remediation/compensation;Patient/family education;Balance training    OT Goals(Current goals can be found in the care plan section) Acute Rehab OT Goals Patient Stated Goal: to see his family OT Goal Formulation: With patient Time For Goal Achievement: 09/01/21 Potential to Achieve Goals: Good  OT Frequency: Min 2X/week   Barriers to D/C:            Co-evaluation              AM-PAC OT "6 Clicks" Daily Activity     Outcome Measure Help from another person eating meals?: A Little Help from another person taking care of personal grooming?: A Little Help from another person toileting, which includes using toliet, bedpan, or urinal?: A Little Help from another person bathing (including washing, rinsing, drying)?: A Little Help from another person to put on and taking off regular upper body clothing?: A Little Help from another person to put on and taking off regular lower body  clothing?: A Little 6 Click Score: 18   End of Session Equipment Utilized During Treatment: Gait belt;Rolling walker Nurse  Communication: Mobility status  Activity Tolerance: Patient tolerated treatment well Patient left: in bed;with call bell/phone within reach;with bed alarm set  OT Visit Diagnosis: Unsteadiness on feet (R26.81);Cognitive communication deficit (R41.841)                Time: [id number] OT Time Calculation (min): 26 min Charges:  OT General Charges $OT Visit: 1 Visit OT Evaluation $OT Eval Moderate Complexity: 1 Mod  Nilsa Nutting., OTR/L Acute Rehabilitation Services Pager [phone] Office [phone]   Lucille Passy M 08/18/2021, 1:26 PM

## 2021-08-18 NOTE — Progress Notes (Signed)
Patient still unable to void. Bladder scan performed, and a total of 450 Ml of urine scanned. In and Out cath performed and a total of 410 ml of urine drained. Post In and Out Cath bladder scan indicated 40 ml of residual.

## 2021-08-18 NOTE — Progress Notes (Signed)
Physical Therapy Treatment Patient Details Name: Travis Salazar. MRN: [id number] DOB: 1936/01/14 Today's Date: 08/18/2021    History of Present Illness Pt is an 85 year old male admitted 8/29 for status epilepticus, new onset seizure disorder. Intubated 8/29-8/31. MRI negative for acute abnormality. PMH: R MCA stroke, PE on Eliquis, HTN, RUL mass (suspect lung CA)    PT Comments    Patient demos impulsivity and decreased awareness of safety and deficits. Patient requires min-modA for ambulation with and without RW due to balance and decreased attention. Difficulty finding room numbers during ambulation and required direct cueing/pointing at room numbers for patient to find them. Recommend SNF at discharge to maximize functional mobility and safety. If family able to provide 24 hour assistance at discharge, patient will be able to return home with HHPT.     Follow Up Recommendations  SNF;Other (comment);Supervision/Assistance - 24 hour (unless family can provide 24 hour assistance at home.)     Equipment Recommendations  Rolling Jamee Pacholski with 5" wheels    Recommendations for Other Services       Precautions / Restrictions Precautions Precautions: Fall Precaution Comments: Pt impulsive with poor safety awareness Restrictions Weight Bearing Restrictions: No    Mobility  Bed Mobility Overal bed mobility: Needs Assistance Bed Mobility: Supine to Sit;Sit to Supine     Supine to sit: Supervision Sit to supine: Supervision   General bed mobility comments: supervision for safety    Transfers Overall transfer level: Needs assistance Equipment used: 2 person hand held assist;Rolling Audrionna Lampton (2 wheeled) Transfers: Sit to/from [facility] Sit to Stand: Min assist;+2 physical assistance;+2 safety/equipment Stand pivot transfers: Min assist;+2 safety/equipment       General transfer comment: Pt very impulsive with poor safety awareness  - max cues for RW use.  He tends  to crowd therapist into the wall on both Rt and Lt sides when ambulating with him  Ambulation/Gait Ambulation/Gait assistance: Min assist;Mod assist Gait Distance (Feet): 200 Feet Assistive device: None;1 person hand held assist;Rolling Seth Friedlander (2 wheeled) Gait Pattern/deviations: Step-through pattern;Decreased stride length;Staggering right;Trunk flexed;Decreased step length - left Gait velocity: decreased   General Gait Details: +2 for safety due to impulsivity. Initially with no AD but patient reaching for objects in unsafe manner. Provided HHAx1 with improved stability however continued to be unsteady requiring assist to maintain balance. Provided RW however patient unsafe with RW. Cues and assist required for close proximity to RW and staying inside Jaxiel Kines during turns. Patient with difficulty wayfinding and locating room numbers   Stairs             Wheelchair Mobility    Modified Rankin (Stroke Patients Only)       Balance Overall balance assessment: Needs assistance Sitting-balance support: Feet supported;Bilateral upper extremity supported Sitting balance-Leahy Scale: Fair     Standing balance support: Bilateral upper extremity supported;During functional activity Standing balance-Leahy Scale: Poor Standing balance comment: requires UE support                            Cognition Arousal/Alertness: Awake/alert Behavior During Therapy: WFL for tasks assessed/performed;Impulsive Overall Cognitive Status: Impaired/Different from baseline Area of Impairment: Attention;Following commands;Safety/judgement                   Current Attention Level: Selective   Following Commands: Follows one step commands consistently Safety/Judgement: Decreased awareness of safety;Decreased awareness of deficits Awareness: Emergent Problem Solving: Requires verbal cues;Difficulty sequencing General Comments: Pt very  distracted requiring max cues to attend to  task.  He demonstrates poor safety awareness. He required total a to locate his room during simple path finding task.      Exercises      General Comments General comments (skin integrity, edema, etc.): VSS      Pertinent Vitals/Pain Pain Assessment: No/denies pain    Home Living Family/patient expects to be discharged to:: Private residence Living Arrangements: Spouse/significant other;Children Available Help at Discharge: Family;Available 24 hours/day Type of Home: House Home Access: Stairs to enter Entrance Stairs-Rails: None Home Layout: One level Home Equipment: Shower seat;Hand held shower head;Cane - single point      Prior Function Level of Independence: Independent      Comments: Pt reports he ambulated occasionally with SPC, but mostly doesn't use a device. He reports he was independent with ADLs PTA.  He reports he drives and enjoys "piddling".  "I don't sit still"   PT Goals (current goals can now be found in the care plan section) Acute Rehab PT Goals Patient Stated Goal: to see his family PT Goal Formulation: With patient Time For Goal Achievement: 08/31/21 Potential to Achieve Goals: Good Progress towards PT goals: Progressing toward goals    Frequency    Min 4X/week      PT Plan Current plan remains appropriate    Co-evaluation              AM-PAC PT "6 Clicks" Mobility   Outcome Measure  Help needed turning from your back to your side while in a flat bed without using bedrails?: A Little Help needed moving from lying on your back to sitting on the side of a flat bed without using bedrails?: A Little Help needed moving to and from a bed to a chair (including a wheelchair)?: A Little Help needed standing up from a chair using your arms (e.g., wheelchair or bedside chair)?: A Little Help needed to walk in hospital room?: A Lot Help needed climbing 3-5 steps with a railing? : A Lot 6 Click Score: 16    End of Session Equipment Utilized  During Treatment: Gait belt Activity Tolerance: Patient tolerated treatment well Patient left: in bed;with call bell/phone within reach;with bed alarm set Nurse Communication: Mobility status PT Visit Diagnosis: Unsteadiness on feet (R26.81);Other abnormalities of gait and mobility (R26.89);Muscle weakness (generalized) (M62.81)     Time: [id number] PT Time Calculation (min) (ACUTE ONLY): 24 min  Charges:  $Gait Training: 8-22 mins                     Devetta Hagenow A. Gilford Rile PT, DPT Acute Rehabilitation Services Pager [phone] Office [phone]    Linna Hoff 08/18/2021, 1:28 PM

## 2021-08-19 MED ORDER — SODIUM CHLORIDE 0.9 % IV SOLN
1.0000 g | INTRAVENOUS | Status: DC
  Administered 2021-08-19: 1 g via INTRAVENOUS
  Filled 2021-08-19 (×2): qty 10

## 2021-08-19 NOTE — Plan of Care (Signed)

## 2021-08-19 NOTE — TOC Progression Note (Addendum)
Transition of Care (TOC) - Progression Note    Patient Details  Name: Travis Salazar. MRN: [id number] Date of Birth: [birth year]  Transition of Care [facility]) CM/SW [place], LCSW Phone Number: 08/19/2021, 9:51 AM  Clinical Narrative:    CSW met with patient and introduced herself and role. Patient was noted to be laying in bed covers over his head. CSW inquired about SNF as CIR declined. Patient reported he was open to it and expressed no preferences regarding it. CSW provided education about treatment and noted patient declined any questions. CSW will begin SNF referral process.   1:58p: CSW contacted by MD as patient indicated he wanted to go home versus going to a SNF. CSW met with patient and noted patient wanted to go home with family and have PT come to his home. Patient asked why his condition was different now from the prior stroke in when he went home that time. CSW informed MD and notes bed offers remain pending but alerted RNCM to be aware of possible disposition change.    Expected Discharge Plan: Skilled Nursing Facility Barriers to Discharge: SNF Pending bed offer  Expected Discharge Plan and Services Expected Discharge Plan: [place] In-house Referral: Clinical Social Work     Living arrangements for the past 2 months: Single Family Home                                       Social Determinants of Health (SDOH) Interventions    Readmission Risk Interventions No flowsheet data found.

## 2021-08-19 NOTE — NC FL2 (Signed)
[place] LEVEL OF CARE SCREENING TOOL     IDENTIFICATION  Patient Name: Travis Salazar. Birthdate: 1936/11/07 Sex: male Admission Date (Current Location): 08/13/2021  [facility] and [place] Number:  Herbalist and Address:  [facility], [place] [address], [place], [place] [address]      Provider Number: [id number]  Attending Physician Name and Address:  Mariel Aloe, MD  Relative Name and Phone Number:  Ayodele Marks, spouse, [phone]    Current Level of Care: Hospital Recommended Level of Care: [place] Prior Approval Number:    Date Approved/Denied: 08/19/21 PASRR Number: [id number]  Discharge Plan: SNF    Current Diagnoses: Patient Active Problem List   Diagnosis Date Noted   Acute urinary retention 08/18/2021   Status epilepticus ([place]) 08/13/2021   Acute pulmonary embolism without acute cor pulmonale ([place]) 05/27/2021   Demand ischemia ([place]) 05/27/2021   CAD (coronary artery disease) 05/27/2021   Emphysema lung ([place]) 05/27/2021   Acute left hemiparesis ([place]) 04/30/2020   Paroxysmal supraventricular tachycardia ([place]) 04/30/2020   Pulmonary mass 01/07/2020   CVA (cerebral vascular accident) ([place]) 06/26/2019   Wrist swelling, right 06/03/2017   S/P prostatectomy 10/08/2016   Snores 10/08/2016   Essential (primary) hypertension 09/27/2015   History of tobacco use 09/27/2015   Hypercholesterolemia 09/27/2015   Blood glucose elevated 09/27/2015   Adiposity 09/27/2015    Orientation RESPIRATION BLADDER Height & Weight     Self, Time, Situation, Place  Normal Continent Weight: 197 lb 5 oz (89.5 kg) Height:  '5\' 8"'$  (172.7 cm)  BEHAVIORAL SYMPTOMS/MOOD NEUROLOGICAL BOWEL NUTRITION STATUS      Continent Diet (heart healthy, thin)  AMBULATORY STATUS COMMUNICATION OF NEEDS Skin   Limited Assist Verbally Normal                       Personal Care Assistance Level of Assistance  Bathing,  Feeding, Dressing Bathing Assistance: Limited assistance Feeding assistance: Limited assistance Dressing Assistance: Limited assistance     Functional Limitations Info             SPECIAL CARE FACTORS FREQUENCY  PT (By licensed PT), OT (By licensed OT)     PT Frequency: 5x weekly OT Frequency: 5x weekly            Contractures Contractures Info: Not present    Additional Factors Info  Code Status, Allergies Code Status Info: Full Allergies Info: NKDA           Current Medications (08/19/2021):  This is the current hospital active medication list Current Facility-Administered Medications  Medication Dose Route Frequency Provider Last Rate Last Admin   amLODipine (NORVASC) tablet 10 mg  10 mg Oral Daily Gerald Leitz D, NP   10 mg at 08/18/21 1047   apixaban (ELIQUIS) tablet 5 mg  5 mg Oral BID Gerald Leitz D, NP   5 mg at 08/18/21 2114   atorvastatin (LIPITOR) tablet 40 mg  40 mg Oral q1800 Gerald Leitz D, NP   40 mg at 08/18/21 1740   bethanechol (URECHOLINE) tablet 10 mg  10 mg Oral TID Gerald Leitz D, NP   10 mg at 08/18/21 2114   chlorhexidine (PERIDEX) 0.12 % solution 15 mL  15 mL Mouth Rinse BID Gerald Leitz D, NP   15 mL at 08/18/21 2115   Chlorhexidine Gluconate Cloth 2 % PADS 6 each  6 each Topical Daily Maryjane Hurter, MD  6 each at 08/17/21 0938   docusate (COLACE) 50 MG/5ML liquid 100 mg  100 mg Oral BID Gerald Leitz D, NP   100 mg at 08/18/21 2114   hydrALAZINE (APRESOLINE) injection 10-20 mg  10-20 mg Intravenous Q4H PRN Corey Harold, NP       levETIRAcetam (KEPPRA) tablet 1,000 mg  1,000 mg Oral BID Jacky Kindle, MD   1,000 mg at 08/18/21 2114   MEDLINE mouth rinse  15 mL Mouth Rinse q12n4p Gerald Leitz D, NP   15 mL at 08/18/21 1827   melatonin tablet 3 mg  3 mg Oral QHS PRN Donnetta Simpers, MD   3 mg at 08/18/21 2114   metoprolol tartrate (LOPRESSOR) tablet 12.5 mg  12.5 mg Oral BID Gerald Leitz D, NP   12.5 mg at  08/18/21 2114   polyethylene glycol (MIRALAX / GLYCOLAX) packet 17 g  17 g Oral Daily Gerald Leitz D, NP   17 g at 08/18/21 1046   tamsulosin (FLOMAX) capsule 0.4 mg  0.4 mg Oral Daily Mariel Aloe, MD   0.4 mg at 08/18/21 1219     Discharge Medications: Please see discharge summary for a list of discharge medications.  Relevant Imaging Results:  Relevant Lab Results:   Additional Information [id number],  COVID pfifer 12/18/2020  Oretha Milch, LCSW

## 2021-08-19 NOTE — TOC Progression Note (Signed)
Transition of Care (TOC) - Progression Note    Patient Details  Name: Travis Salazar. MRN: [id number] Date of Birth: May 30, 1936  Transition of Care [facility]) CM/SW Contact  Bartholomew Crews, RN Phone Number: [phone] 08/19/2021, 3:31 PM  Clinical Narrative:     Notified that patient wanting to go home with [facility], not SNF. Noted patient ambulating 200' with PT yesterday, therefore, transition home is more appropriate per Medicare guidelines. Patient lives with spouse and adult son. Adult daughter is available often to assist and stay in the home as needed.   Verified demographics, PCP, and preferred pharmacy as indicated in [place].   Discussed HH recommendations for PT and OT. Patient agreeable. Offered choice of [place] agency. Referral accepted by [facility] for PT and OT. Orders received.   Patient has a cane and a shower chair. He stated that his wife has a walker. Advised to get to him a walker of his own. Patient agreeable. Referral to [facility] for delivery to the room.   Patient stated that family will provide transportation home in private vehicle.   TOC following for transition needs.   Expected Discharge Plan: [place] Barriers to Discharge: Continued Medical Work up  Expected Discharge Plan and Services Expected Discharge Plan: [place] In-house Referral: Clinical Social Work Discharge Planning Services: CM Consult Post Acute Care Choice: Home Health, Durable Medical Equipment Living arrangements for the past 2 months: [place]                 DME Arranged: Walker rolling DME Agency: [facility] Date DME Agency Contacted: 08/19/21 Time DME Agency Contacted: 1500 Representative spoke with at DME Agency: [place]: PT, OT [place] Agency: [place] Date [place]: 08/19/21 Time [place]: 1500 Representative spoke with at [place]: [place] ([place])  Interventions    Readmission Risk Interventions No flowsheet data found.

## 2021-08-19 NOTE — Progress Notes (Signed)
PROGRESS NOTE    Travis Salazar.  [id number] DOB: 11-13-1936 DOA: 08/13/2021 PCP: [name], MD   Brief Narrative: Travis Salazar. is a 85 y.o. male with history of CVA, PE on Eliquis, hypertension.  Patient presented secondary to left-sided weakness initial [place] concerning for CVA but was found to have evidence of seizures and was in status epilepticus.  Patient was intubated secondary to inability to protect his airway.  Patient received Precedex, Keppra, Dilantin to control seizures.  Neurology was consulted and patient underwent LTM EEG.  Patient extubated successfully and seizures are controlled on Keppra.   Assessment & Plan:   Principal Problem:   Status epilepticus ([place]) Active Problems:   Essential (primary) hypertension   Pulmonary mass   Acute urinary retention  Status epilepticus Present on admission. Neurology consulted. Patient required ICU admission secondary to inability to protect airway and respiratory failure. Patient initially started on Precedex, Keppra 40 mg/kg load and dilantin. LTM EEG significant for epileptogenicity arising from right hemisphere. Seizure likely secondary to prior stroke history. Dilantin discontinued secondary to drug interaction with Eliquis. Neurology signed off. -Continue Keppra 1000 mg BID; outpatient neurology follow-up  Acute metabolic encephalopathy Secondary to seizure/status epilepticus. Resolved.  Acute respiratory failure with hypoxia Patient required intubation on 8/29. He was extubated successfully on 8/31 and is now on room air. Resolved.  History of pulmonary embolism -Continue Eliquis  RUL mass Concern for cancer but patient has decided on not pursue diagnosis. Follow-up as an outpatient.  Primary hypertension -Continue amlodipine, hydralazine, hydrochlorothiazide  Acute urinary retention Foley catheter placed on 8/31 and removed on 9/2. Patient has been having issues with voiding. Urine culture  significant for E. Coli (40k colonies) -Ceftriaxone IV -Flomax -Follow-up urine culture sensitivities   DVT prophylaxis: Eliquis Code Status:   Code Status: Full Code Family Communication: None at bedside Disposition Plan: Discharge to SNF pending bed availability    Consultants:  PCCM Neurology  Procedures:  EEG (08/13/2021)  IMPRESSION: This study howed evidence of epileptogenicity and cortical dysfunction arising from right hemisphere, maximal right temporal region likely secondary to underlying structural abnormality, seizure. Of note, lateralized periodic discharges are on the ictal-interictal continuum with increased potential for seizure recurrence. There is also moderate to severe diffuse encephalopathy, nonspecific etiology but likely related to seizure. No seizures were seen throughout the recording.   EEG (08/13/2021 - 08/14/2021)  IMPRESSION: This study initially showed evidence of epileptogenicity and cortical dysfunction arising from right hemisphere, maximal right temporal region likely secondary to underlying structural abnormality, seizure.  There was also moderate to severe diffuse encephalopathy, nonspecific etiology but likely related to sedation. No seizures were seen throughout the recording.   EEG (08/14/2021 - 08/15/2021)  IMPRESSION: This study showed evidence of epileptogenicity arising from right temporal region. No seizures were seen throughout the recording.   EEG appears to be improving compared to previous day  Antimicrobials: None    Subjective: No issues overnight. No concerns per patient.  Objective: Vitals:   08/18/21 2336 08/19/21 0344 08/19/21 0500 08/19/21 1257  BP: (!) 148/75 122/78  127/78  Pulse: 90 80    Resp: '18 18  20  '$ Temp: 99 F (37.2 C) 98.7 F (37.1 C)  98 F (36.7 C)  TempSrc: Oral Oral  Oral  SpO2: 98% 96%  98%  Weight:   89.5 kg   Height:        Intake/Output Summary (Last 24 hours) at 08/19/2021 1334 Last data  filed at  08/19/2021 0341 Gross per 24 hour  Intake --  Output 825 ml  Net -825 ml    Filed Weights   08/14/21 0415 08/18/21 0500 08/19/21 0500  Weight: 89.5 kg 89.5 kg 89.5 kg    Examination:  General exam: Appears calm and comfortable Respiratory system: Clear to auscultation. Respiratory effort normal. Cardiovascular system: S1 & S2 heard, RRR. No murmurs, rubs, gallops or clicks. Gastrointestinal system: Abdomen is nondistended, soft and nontender. No organomegaly or masses felt. Normal bowel sounds heard. Central nervous system: Alert. No focal neurological deficits. Musculoskeletal: No edema. No calf tenderness Skin: No cyanosis. No rashes   Data Reviewed: I have personally reviewed following labs and imaging studies  CBC Lab Results  Component Value Date   WBC 9.3 08/15/2021   RBC 4.04 (L) 08/15/2021   HGB 12.1 (L) 08/15/2021   HCT 36.9 (L) 08/15/2021   MCV 91.3 08/15/2021   MCH 30.0 08/15/2021   PLT 106 (L) 08/15/2021   MCHC 32.8 08/15/2021   RDW 14.6 08/15/2021   LYMPHSABS 2.8 08/13/2021   MONOABS 0.6 08/13/2021   EOSABS 0.1 08/13/2021   BASOSABS 0.1 [id number]     Last metabolic panel Lab Results  Component Value Date   NA 135 08/15/2021   K 3.7 08/15/2021   CL 103 08/15/2021   CO2 24 08/15/2021   BUN 19 08/15/2021   CREATININE 0.77 08/15/2021   GLUCOSE 119 (H) 08/15/2021   GFRNONAA >60 08/15/2021   GFRAA 86 08/29/2020   CALCIUM 9.3 08/15/2021   PHOS 4.3 08/14/2021   PROT 7.1 08/13/2021   ALBUMIN 3.6 08/13/2021   LABGLOB 2.6 08/29/2020   AGRATIO 1.8 08/29/2020   BILITOT 0.8 08/13/2021   ALKPHOS 46 08/13/2021   AST 22 08/13/2021   ALT 13 08/13/2021   ANIONGAP 8 08/15/2021    CBG (last 3)  No results for input(s): GLUCAP in the last 72 hours.   GFR: Estimated Creatinine Clearance: 77.3 mL/min (by C-G formula based on SCr of 0.77 mg/dL).  Coagulation Profile: Recent Labs  Lab 08/13/21 1600  INR 1.0     Recent Results (from the  past 240 hour(s))  Resp Panel by RT-PCR (Flu A&B, Covid) Nasopharyngeal Swab     Status: None   Collection Time: 08/13/21  4:14 PM   Specimen: Nasopharyngeal Swab; Nasopharyngeal(NP) swabs in vial transport medium  Result Value Ref Range Status   SARS Coronavirus 2 by RT PCR NEGATIVE NEGATIVE Final    Comment: (NOTE) SARS-CoV-2 target nucleic acids are NOT DETECTED.  The SARS-CoV-2 RNA is generally detectable in upper respiratory specimens during the acute phase of infection. The lowest concentration of SARS-CoV-2 viral copies this assay can detect is 138 copies/mL. A negative result does not preclude SARS-Cov-2 infection and should not be used as the sole basis for treatment or other patient management decisions. A negative result may occur with  improper specimen collection/handling, submission of specimen other than nasopharyngeal swab, presence of viral mutation(s) within the areas targeted by this assay, and inadequate number of viral copies(<138 copies/mL). A negative result must be combined with clinical observations, patient history, and epidemiological information. The expected result is Negative.  Fact Sheet for Patients:  EntrepreneurPulse.com.au  Fact Sheet for Healthcare Providers:  IncredibleEmployment.be  This test is no t yet approved or cleared by the [place] FDA and  has been authorized for detection and/or diagnosis of SARS-CoV-2 by FDA under an Emergency Use Authorization (EUA). This EUA will remain  in effect (meaning this test  can be used) for the duration of the COVID-19 declaration under Section 564(b)(1) of the Act, 21 U.S.C.section 360bbb-3(b)(1), unless the authorization is terminated  or revoked sooner.       Influenza A by PCR NEGATIVE NEGATIVE Final   Influenza B by PCR NEGATIVE NEGATIVE Final    Comment: (NOTE) The Xpert Xpress SARS-CoV-2/FLU/RSV plus assay is intended as an aid in the diagnosis of  influenza from Nasopharyngeal swab specimens and should not be used as a sole basis for treatment. Nasal washings and aspirates are unacceptable for Xpert Xpress SARS-CoV-2/FLU/RSV testing.  Fact Sheet for Patients: EntrepreneurPulse.com.au  Fact Sheet for Healthcare Providers: IncredibleEmployment.be  This test is not yet approved or cleared by the [place] FDA and has been authorized for detection and/or diagnosis of SARS-CoV-2 by FDA under an Emergency Use Authorization (EUA). This EUA will remain in effect (meaning this test can be used) for the duration of the COVID-19 declaration under Section 564(b)(1) of the Act, 21 U.S.C. section 360bbb-3(b)(1), unless the authorization is terminated or revoked.  Performed at [facility], [place] [address], [place], [place] [address]   MRSA Next Gen by PCR, Nasal     Status: None   Collection Time: 08/13/21 10:19 PM   Specimen: Nasal Mucosa; Nasal Swab  Result Value Ref Range Status   MRSA by PCR Next Gen NOT DETECTED NOT DETECTED Final    Comment: (NOTE) The GeneXpert MRSA Assay (FDA approved for NASAL specimens only), is one component of a comprehensive MRSA colonization surveillance program. It is not intended to diagnose MRSA infection nor to guide or monitor treatment for MRSA infections. Test performance is not FDA approved in patients less than 81 years old. Performed at [facility], [place] [address], [place], [place] [address]   Urine Culture     Status: Abnormal (Preliminary result)   Collection Time: 08/18/21  9:57 AM   Specimen: Urine, Clean Catch  Result Value Ref Range Status   Specimen Description URINE, CLEAN CATCH  Final   Special Requests NONE  Final   Culture (A)  Final    40,000 COLONIES/mL ESCHERICHIA COLI SUSCEPTIBILITIES TO FOLLOW Performed at [facility], [place] [address] [place], [place] [address]    Report Status PENDING  Incomplete          Radiology Studies: No results found.      Scheduled Meds:  amLODipine  10 mg Oral Daily   apixaban  5 mg Oral BID   atorvastatin  40 mg Oral q1800   bethanechol  10 mg Oral TID   chlorhexidine  15 mL Mouth Rinse BID   Chlorhexidine Gluconate Cloth  6 each Topical Daily   docusate  100 mg Oral BID   levETIRAcetam  1,000 mg Oral BID   mouth rinse  15 mL Mouth Rinse q12n4p   metoprolol tartrate  12.5 mg Oral BID   polyethylene glycol  17 g Oral Daily   tamsulosin  0.4 mg Oral Daily   Continuous Infusions:   LOS: 6 days     Cordelia Poche, MD [facility] 08/19/2021, 1:34 PM  If 7PM-7AM, please contact night-coverage [email or web address]

## 2021-08-20 LAB — URINE CULTURE: Culture: 40000 — AB

## 2021-08-20 MED ORDER — SODIUM CHLORIDE 0.9 % IV SOLN
1.0000 g | Freq: Three times a day (TID) | INTRAVENOUS | Status: DC
  Administered 2021-08-20 – 2021-08-21 (×3): 1 g via INTRAVENOUS
  Filled 2021-08-20 (×4): qty 1

## 2021-08-20 NOTE — Progress Notes (Signed)
Foley removed for voiding trial per verbal order from Dr. Dwyane Dee. Will bladder scan at 4:20 pm if pt has not voided by then.   Justice Rocher, RN

## 2021-08-20 NOTE — TOC Progression Note (Signed)
Transition of Care (TOC) - Progression Note    Patient Details  Name: Travis Salazar. MRN: [id number] Date of Birth: 03-31-1936  Transition of Care [facility]) CM/SW [place], [place] Phone Number: 08/20/2021, 11:27 AM  Clinical Narrative:     CSW is informed that pt is considering SNF now. CSW met with pt bedside to discuss SNF and provide bed offers. Pt asks why he can't go home. CSW explained that PT is recommending SNF and that disposition is pt's choice. CSW explained services available between SNF vs home with [facility]. Pt explains that he has 24/7 supervision at home. He states he has 2 daughters and 2 sons that are available to assist him. He states he prefers to go home and reports he has had [place] before in the past. Pt has already has [place] arranged for DC.   Expected Discharge Plan: [place] Barriers to Discharge: Continued Medical Work up  Expected Discharge Plan and Services Expected Discharge Plan: [place] In-house Referral: Clinical Social Work Discharge Planning Services: CM Consult Post Acute Care Choice: Home Health, Durable Medical Equipment Living arrangements for the past 2 months: [place]                 DME Arranged: [place] DME Agency: [employer] Date DME Agency Contacted: 08/19/21 Time DME Agency Contacted: 1500 Representative spoke with at DME Agency: [place]: PT, OT [place] Agency: [place] Date [place]: 08/19/21 Time [place]: 1500 Representative spoke with at [place]: Regino Ramirez ([place]) Interventions    Readmission Risk Interventions No flowsheet data found.

## 2021-08-20 NOTE — Progress Notes (Signed)
Occupational Therapy Treatment Patient Details Name: Travis Salazar. MRN: [id number] DOB: 1936-02-08 Today's Date: 08/20/2021    History of present illness Pt is an 85 year old male admitted 8/29 for status epilepticus, new onset seizure disorder. Intubated 8/29-8/31. MRI negative for acute abnormality. PMH: R MCA stroke, PE on Eliquis, HTN, RUL mass (suspect lung CA)   OT comments  Pt making good progress however recommend direct S with ADL and mobility. Pt states his daughter supervises his medication management. Pt scored a 12 on the Short Blessed Test of Memory and Concentration, placing him in the impaired category of cognition (0-4 normal; 5-9 questionable impairment; >10 impaired in need of further assessment). Recommend follow up with [place]. Will continue to follow acutely.  Follow Up Recommendations  Supervision/Assistance - 24 hour;Home health OT    Equipment Recommendations  Tub/shower seat    Recommendations for Other Services      Precautions / Restrictions Precautions Precautions: Fall       Mobility Bed Mobility Overal bed mobility: Modified Independent                  Transfers Overall transfer level: Needs assistance   Transfers: Sit to/from Stand Sit to Stand: Min assist              Balance Overall balance assessment: Needs assistance   Sitting balance-Leahy Scale: Good       Standing balance-Leahy Scale: Poor                             ADL either performed or assessed with clinical judgement   ADL Overall ADL's : Needs assistance/impaired     Grooming: Wash/dry face;Wash/dry hands;Oral care;Set up               Lower Body Dressing: Min guard;Sit to/from stand   Toilet Transfer: Minimal assistance;Ambulation;RW   Toileting- Clothing Manipulation and Hygiene: Minimal assistance;Sit to/from stand       Functional mobility during ADLs: Minimal assistance;Rolling walker;Cueing for safety General ADL Comments:  unsafe at times     Vision       Perception     Praxis      Cognition Arousal/Alertness: Awake/alert Behavior During Therapy: [facility] for tasks assessed/performed;Impulsive Overall Cognitive Status: No family/caregiver present to determine baseline cognitive functioning Area of Impairment: Attention;Memory;Safety/judgement;Awareness                               General Comments: Assessed with the Short Blessed Test - scored a 12; unablet o recall 5/5 worrds; >2 errors when stating months of the year in reverse order        Exercises     Shoulder Instructions       General Comments      Pertinent Vitals/ Pain       Pain Assessment: No/denies pain  Home Living                                          Prior Functioning/Environment              Frequency  Min 2X/week        Progress Toward Goals  OT Goals(current goals can now be found in the care plan section)  Progress towards OT goals: Progressing toward goals  Acute  Rehab OT Goals Patient Stated Goal: to see his family OT Goal Formulation: With patient Time For Goal Achievement: 09/01/21 Potential to Achieve Goals: Good ADL Goals Pt Will Perform Grooming: with min guard assist;standing Pt Will Perform Lower Body Bathing: with min guard assist;sit to/from stand Pt Will Perform Lower Body Dressing: with min guard assist;sit to/from stand Pt Will Transfer to Toilet: with min guard assist;ambulating;regular height toilet;bedside commode;grab bars Pt Will Perform Toileting - Clothing Manipulation and hygiene: with min guard assist;sit to/from stand  Plan Discharge plan needs to be updated    Co-evaluation                 AM-PAC OT "6 Clicks" Daily Activity     Outcome Measure   Help from another person eating meals?: None Help from another person taking care of personal grooming?: A Little Help from another person toileting, which includes using toliet, bedpan,  or urinal?: A Little Help from another person bathing (including washing, rinsing, drying)?: A Little Help from another person to put on and taking off regular upper body clothing?: A Little Help from another person to put on and taking off regular lower body clothing?: A Little 6 Click Score: 19    End of Session Equipment Utilized During Treatment: Gait belt;Rolling walker  OT Visit Diagnosis: Unsteadiness on feet (R26.81);Other abnormalities of gait and mobility (R26.89);Other symptoms and signs involving cognitive function   Activity Tolerance Patient tolerated treatment well   Patient Left in chair;with call bell/phone within reach;with chair alarm set   Nurse Communication Mobility status        Time: 1250-1314 OT Time Calculation (min): 24 min  Charges: OT General Charges $OT Visit: 1 Visit OT Treatments $Self Care/Home Management : 23-37 mins  Maurie Boettcher, OT/L   Acute OT Clinical Specialist [place] Pager [phone] Office [phone]    [facility] 08/20/2021, 3:24 PM

## 2021-08-20 NOTE — Progress Notes (Signed)
Pt had not voided since foley removal and did not feel the urge. Pt was encouraged to drink fluids, still with no results. Bladder scan showed 324 mL on 1st scan, 317 mL on second scan. Pt in and out cath'd 275 mL clear, amber urine.   Justice Rocher, RN

## 2021-08-20 NOTE — Progress Notes (Signed)
PROGRESS NOTE    Travis Salazar.  [id number] DOB: Oct 22, 1936 DOA: 08/13/2021 PCP: [name], MD   Brief Narrative:  This 85 y.o. male with PMH significant of CVA, PE on Eliquis, hypertension presented with c/o: Left-sided weakness concerning for CVA but was found to have evidence of seizures and was in status epilepticus.  Patient was intubated secondary to inability to protect his airway.  Patient received Precedex, Keppra, Dilantin to control seizures.  Neurology was consulted and patient underwent LTM EEG.  Patient extubated successfully and  now seizures are controlled on Keppra.   Assessment & Plan:   Principal Problem:   Status epilepticus ([place]) Active Problems:   Essential (primary) hypertension   Pulmonary mass   Acute urinary retention  Status epilepticus:  Present on admission, Neurology consulted.  Patient was intubated for inability to protect airway and respiratory failure and was admitted in ICU.  Patient was managed with Precedex, Keppra 40 mg/kg load and dilantin.  LTM EEG significant for epileptogenicity arising from right hemisphere. Seizure likely secondary to prior stroke history.  Dilantin discontinued secondary to drug interaction with Eliquis. Neurology signed off. Continue Keppra 1000 mg BID; outpatient neurology follow-up   Acute metabolic encephalopathy: > Resolved. Secondary to seizures/status epilepticus.   Acute hypoxic respiratory failure: > Resolved. Patient required intubation on 8/29.   Successfully extubated on 8/31.  Now on room air.   History of pulmonary embolism Continue Eliquis   Right upper lobe mass: Concern for cancer but patient has decided on not pursue diagnosis. Follow-up as an outpatient.   Essential hypertension Continue amlodipine, hydralazine, HCTZ   Acute urinary retention  Foley catheter placed on 8/31 Patient has been having issues with voiding.  Continue Flomax, urocholine.  ESBL UTI: Urine culture  significant for ESBL (40k colonies) Ceftriaxone changed to meropenem.    DVT prophylaxis: Eliquis Code Status: Full code Family Communication: No family at bedside Disposition Plan:    Status is: Inpatient  Remains inpatient appropriate because:Inpatient level of care appropriate due to severity of illness  Dispo: The patient is from: Home              Anticipated d/c is to: Home              Patient currently is not medically stable to d/c.   Difficult to place patient No  Consultants:  PCCM Neurology  Procedures: Long-term EEG Antimicrobials:  Anti-infectives (From admission, onward)    Start     Dose/Rate Route Frequency Ordered Stop   08/20/21 1430  meropenem (MERREM) 1 g in sodium chloride 0.9 % 100 mL IVPB        1 g 200 mL/hr over 30 Minutes Intravenous Every 8 hours 08/20/21 1342     08/19/21 1430  cefTRIAXone (ROCEPHIN) 1 g in sodium chloride 0.9 % 100 mL IVPB  Status:  Discontinued        1 g 200 mL/hr over 30 Minutes Intravenous Every 24 hours 08/19/21 1335 08/20/21 1342       Subjective: Patient was seen and examined at bedside.  Overnight events noted.   He reports feeling better and states seizures are well controlled now.  Objective: Vitals:   08/19/21 2314 08/20/21 0311 08/20/21 0724 08/20/21 1056  BP: (!) 154/82 (!) 148/78 (!) 169/80 129/87  Pulse: 83 84 80 80  Resp: '18 20 13 18  '$ Temp: 98.8 F (37.1 C) 97.9 F (36.6 C) 97.9 F (36.6 C) 98 F (36.7 C)  TempSrc:  Oral Oral Oral Oral  SpO2: 95% 99% 95% 96%  Weight:      Height:        Intake/Output Summary (Last 24 hours) at 08/20/2021 1401 Last data filed at 08/20/2021 0905 Gross per 24 hour  Intake 400 ml  Output 1250 ml  Net -850 ml   Filed Weights   08/14/21 0415 08/18/21 0500 08/19/21 0500  Weight: 89.5 kg 89.5 kg 89.5 kg    Examination:  General exam: Appears comfortable, not in any acute distress. Respiratory system: Clear to auscultation bilaterally, no wheezing.  No  crackles Cardiovascular system: S1-S2 heard, regular rate and rhythm, no murmur Gastrointestinal system: Abdomen is soft, nontender, nondistended, BS + Central nervous system: Alert and oriented. No focal neurological deficits. Extremities: No edema, no cyanosis, no clubbing. Skin: No rashes, lesions or ulcers Psychiatry: Judgement and insight appear normal. Mood & affect appropriate.     Data Reviewed: I have personally reviewed following labs and imaging studies  CBC: Recent Labs  Lab 08/13/21 1600 08/13/21 1620 08/14/21 0351 08/15/21 0409  WBC 11.4*  --  8.3 9.3  NEUTROABS 7.8*  --   --   --   HGB 13.7 15.0 12.9* 12.1*  HCT 43.8 44.0 40.3 36.9*  MCV 95.6  --  92.4 91.3  PLT 168  --  137* [id number]*   Basic Metabolic Panel: Recent Labs  Lab 08/13/21 1600 08/13/21 1620 08/14/21 0351 08/15/21 0409  NA 138 140 137 135  K 3.3* 3.7 4.2 3.7  CL 104 103 98 103  CO2 27  --  24 24  GLUCOSE 134* 130* 105* 119*  BUN '17 22 18 19  '$ CREATININE 0.87 0.80 0.88 0.77  CALCIUM 9.6  --  9.8 9.3  MG  --   --  2.1  --   PHOS  --   --  4.3  --    GFR: Estimated Creatinine Clearance: 77.3 mL/min (by C-G formula based on SCr of 0.77 mg/dL). Liver Function Tests: Recent Labs  Lab 08/13/21 1600 08/13/21 2213  AST 22  --   ALT 13  --   ALKPHOS 46  --   BILITOT 0.8  --   PROT 7.1  --   ALBUMIN 3.8 3.6   No results for input(s): LIPASE, AMYLASE in the last 168 hours. Recent Labs  Lab 08/13/21 2213  AMMONIA 25   Coagulation Profile: Recent Labs  Lab 08/13/21 1600  INR 1.0   Cardiac Enzymes: No results for input(s): CKTOTAL, CKMB, CKMBINDEX, TROPONINI in the last 168 hours. BNP (last 3 results) No results for input(s): PROBNP in the last 8760 hours. HbA1C: No results for input(s): HGBA1C in the last 72 hours. CBG: Recent Labs  Lab 08/13/21 1612  GLUCAP 127*   Lipid Profile: No results for input(s): CHOL, HDL, LDLCALC, TRIG, CHOLHDL, LDLDIRECT in the last 72  hours. Thyroid Function Tests: No results for input(s): TSH, T4TOTAL, FREET4, T3FREE, THYROIDAB in the last 72 hours. Anemia Panel: No results for input(s): VITAMINB12, FOLATE, FERRITIN, TIBC, IRON, RETICCTPCT in the last 72 hours. Sepsis Labs: No results for input(s): PROCALCITON, LATICACIDVEN in the last 168 hours.  Recent Results (from the past 240 hour(s))  Resp Panel by RT-PCR (Flu A&B, Covid) Nasopharyngeal Swab     Status: None   Collection Time: 08/13/21  4:14 PM   Specimen: Nasopharyngeal Swab; Nasopharyngeal(NP) swabs in vial transport medium  Result Value Ref Range Status   SARS Coronavirus 2 by RT PCR NEGATIVE NEGATIVE Final  Comment: (NOTE) SARS-CoV-2 target nucleic acids are NOT DETECTED.  The SARS-CoV-2 RNA is generally detectable in upper respiratory specimens during the acute phase of infection. The lowest concentration of SARS-CoV-2 viral copies this assay can detect is 138 copies/mL. A negative result does not preclude SARS-Cov-2 infection and should not be used as the sole basis for treatment or other patient management decisions. A negative result may occur with  improper specimen collection/handling, submission of specimen other than nasopharyngeal swab, presence of viral mutation(s) within the areas targeted by this assay, and inadequate number of viral copies(<138 copies/mL). A negative result must be combined with clinical observations, patient history, and epidemiological information. The expected result is Negative.  Fact Sheet for Patients:  EntrepreneurPulse.com.au  Fact Sheet for Healthcare Providers:  IncredibleEmployment.be  This test is no t yet approved or cleared by the [place] FDA and  has been authorized for detection and/or diagnosis of SARS-CoV-2 by FDA under an Emergency Use Authorization (EUA). This EUA will remain  in effect (meaning this test can be used) for the duration of the COVID-19  declaration under Section 564(b)(1) of the Act, 21 U.S.C.section 360bbb-3(b)(1), unless the authorization is terminated  or revoked sooner.       Influenza A by PCR NEGATIVE NEGATIVE Final   Influenza B by PCR NEGATIVE NEGATIVE Final    Comment: (NOTE) The Xpert Xpress SARS-CoV-2/FLU/RSV plus assay is intended as an aid in the diagnosis of influenza from Nasopharyngeal swab specimens and should not be used as a sole basis for treatment. Nasal washings and aspirates are unacceptable for Xpert Xpress SARS-CoV-2/FLU/RSV testing.  Fact Sheet for Patients: EntrepreneurPulse.com.au  Fact Sheet for Healthcare Providers: IncredibleEmployment.be  This test is not yet approved or cleared by the [place] FDA and has been authorized for detection and/or diagnosis of SARS-CoV-2 by FDA under an Emergency Use Authorization (EUA). This EUA will remain in effect (meaning this test can be used) for the duration of the COVID-19 declaration under Section 564(b)(1) of the Act, 21 U.S.C. section 360bbb-3(b)(1), unless the authorization is terminated or revoked.  Performed at [facility], [place] [address], [place], [place] [address]   MRSA Next Gen by PCR, Nasal     Status: None   Collection Time: 08/13/21 10:19 PM   Specimen: Nasal Mucosa; Nasal Swab  Result Value Ref Range Status   MRSA by PCR Next Gen NOT DETECTED NOT DETECTED Final    Comment: (NOTE) The GeneXpert MRSA Assay (FDA approved for NASAL specimens only), is one component of a comprehensive MRSA colonization surveillance program. It is not intended to diagnose MRSA infection nor to guide or monitor treatment for MRSA infections. Test performance is not FDA approved in patients less than 65 years old. Performed at [facility], [place] [address], [place], [place] [address]   Urine Culture     Status: Abnormal   Collection Time: 08/18/21  9:57 AM   Specimen: Urine, Clean Catch   Result Value Ref Range Status   Specimen Description URINE, CLEAN CATCH  Final   Special Requests   Final    NONE Performed at [facility], [place] [address], [place], [place] [address]    Culture (A)  Final    40,000 COLONIES/mL ESCHERICHIA COLI Confirmed Extended Spectrum Beta-Lactamase Producer (ESBL).  In bloodstream infections from ESBL organisms, carbapenems are preferred over piperacillin/tazobactam. They are shown to have a lower risk of mortality.    Report Status 08/20/2021 FINAL  Final  Organism ID, Bacteria ESCHERICHIA COLI (A)  Final      Susceptibility   Escherichia coli - MIC*    AMPICILLIN >=32 RESISTANT Resistant     CEFAZOLIN >=64 RESISTANT Resistant     CEFEPIME 16 RESISTANT Resistant     CEFTRIAXONE >=64 RESISTANT Resistant     CIPROFLOXACIN >=4 RESISTANT Resistant     GENTAMICIN <=1 SENSITIVE Sensitive     IMIPENEM <=0.25 SENSITIVE Sensitive     NITROFURANTOIN 128 RESISTANT Resistant     TRIMETH/SULFA >=320 RESISTANT Resistant     AMPICILLIN/SULBACTAM 16 INTERMEDIATE Intermediate     PIP/TAZO <=4 SENSITIVE Sensitive     * 40,000 COLONIES/mL ESCHERICHIA COLI    Radiology Studies: No results found.  Scheduled Meds:  amLODipine  10 mg Oral Daily   apixaban  5 mg Oral BID   atorvastatin  40 mg Oral q1800   bethanechol  10 mg Oral TID   chlorhexidine  15 mL Mouth Rinse BID   Chlorhexidine Gluconate Cloth  6 each Topical Daily   docusate  100 mg Oral BID   levETIRAcetam  1,000 mg Oral BID   mouth rinse  15 mL Mouth Rinse q12n4p   metoprolol tartrate  12.5 mg Oral BID   polyethylene glycol  17 g Oral Daily   tamsulosin  0.4 mg Oral Daily   Continuous Infusions:  meropenem (MERREM) IV       LOS: 7 days    Time spent: 35 mins    Cillian Gwinner, MD [facility]   If 7PM-7AM, please contact night-coverage

## 2021-08-21 DIAGNOSIS — G40901 Epilepsy, unspecified, not intractable, with status epilepticus: Secondary | ICD-10-CM | POA: Diagnosis not present

## 2021-08-21 NOTE — Progress Notes (Signed)
Bladder scan twice for 207cc, and 208cc. Pt reports that he has no urge to urinate. Discussed with patient that I will recheck bladder in 2 hours and he may possibly need to have in and out cath performed again, patient understood.  Will continue to monitor.

## 2021-08-21 NOTE — Progress Notes (Signed)
Physical Therapy Treatment Patient Details Name: Travis Salazar. MRN: [id number] DOB: [birth year] Today's Date: 08/21/2021    History of Present Illness Pt is an 85 year old male admitted 08/13/21 for status epilepticus, new onset seizure disorder. Intubated 8/29-8/31. MRI negative for acute abnormality. PMH: R MCA stroke, PE on Eliquis, HTN, RUL mass (suspect lung CA)    PT Comments    Pt was able to ambulate two laps around the hallway with support of RW and min guard assist.  He had some interesting things happen related to knowing where to sit and looking at me/finding me to copy the exercises I was demonstrating.  Not sure if this is visual/perceptual or cognitive or both.  He would be safer with family support at d/c.  PT will continue to follow acutely for safe mobility progression.  Try gait without AD next session.   Follow Up Recommendations  Home health PT;Supervision/Assistance - 24 hour     Equipment Recommendations  Rolling walker with 5" wheels    Recommendations for Other Services       Precautions / Restrictions Precautions Precautions: Fall Precaution Comments: Pt impulsive with poor safety awareness    Mobility  Bed Mobility Overal bed mobility: Modified Independent                  Transfers Overall transfer level: Needs assistance Equipment used: Rolling walker (2 wheeled) Transfers: Sit to/from Stand Sit to Stand: Min guard         General transfer comment: Min guard assist for transfers/safety  Ambulation/Gait Ambulation/Gait assistance: Min guard Gait Distance (Feet): 300 Feet Assistive device: Rolling walker (2 wheeled) Gait Pattern/deviations: Step-through pattern;Shuffle;Trunk flexed     General Gait Details: Pt walking with flexed trunk posture, cues for closer proximity to RW, upright trunk, pt with no overt LOB with use of RW.  I am not sure if he does not have some visual/perceptual issues as I kept trying to get him to go to the  chair on his left and he kept trying to go to the chiar straight ahead, same thing happened when I tried to get him to look at my feet to simulate the exercise I was doing (this was also on the left).   Stairs             Wheelchair Mobility    Modified Rankin (Stroke Patients Only)       Balance Overall balance assessment: Needs assistance Sitting-balance support: Feet supported;No upper extremity supported Sitting balance-Leahy Scale: Good     Standing balance support: Bilateral upper extremity supported Standing balance-Leahy Scale: Poor Standing balance comment: requires UE support                            Cognition Arousal/Alertness: Awake/alert Behavior During Therapy: Impulsive Overall Cognitive Status: Impaired/Different from baseline Area of Impairment: Awareness;Safety/judgement                         Safety/Judgement: Decreased awareness of safety;Decreased awareness of deficits Awareness: Emergent          Exercises General Exercises - Lower Extremity Ankle Circles/Pumps: AROM;Both;10 reps Long Arc Quad: AROM;Both;10 reps Hip Flexion/Marching: AROM;Both;10 reps Toe Raises: AROM;Both;20 reps Heel Raises: AROM;Both;20 reps    General Comments        Pertinent Vitals/Pain Pain Assessment: No/denies pain    Home Living  Prior Function            PT Goals (current goals can now be found in the care plan section) Acute Rehab PT Goals Patient Stated Goal: to go home tomorrow Progress towards PT goals: Progressing toward goals    Frequency    Min 4X/week      PT Plan Current plan remains appropriate    Co-evaluation              AM-PAC PT "6 Clicks" Mobility   Outcome Measure  Help needed turning from your back to your side while in a flat bed without using bedrails?: None Help needed moving from lying on your back to sitting on the side of a flat bed without using  bedrails?: None Help needed moving to and from a bed to a chair (including a wheelchair)?: A Little Help needed standing up from a chair using your arms (e.g., wheelchair or bedside chair)?: A Little Help needed to walk in hospital room?: A Little Help needed climbing 3-5 steps with a railing? : A Little 6 Click Score: 20    End of Session Equipment Utilized During Treatment: Gait belt Activity Tolerance: Patient limited by fatigue Patient left: in chair;with call bell/phone within reach;with chair alarm set Nurse Communication: Mobility status PT Visit Diagnosis: Unsteadiness on feet (R26.81);Other abnormalities of gait and mobility (R26.89);Muscle weakness (generalized) (M62.81)     Time: [id number] PT Time Calculation (min) (ACUTE ONLY): 15 min  Charges:  $Gait Training: 8-22 mins                    Verdene Lennert, PT, DPT  Acute Rehabilitation Ortho Tech Supervisor [phone] pager [phone]) [phone] office

## 2021-08-21 NOTE — Care Management Important Message (Signed)
Important Message  Patient Details  Name: Travis Salazar. MRN: [id number] Date of Birth: Jan 19, 1936   Medicare Important Message Given:        Orbie Pyo 08/21/2021, 1:55 PM

## 2021-08-21 NOTE — Plan of Care (Signed)
Pt remains alert/ oriented and seizure free. Patient is still having difficulty voiding. Plan to bladder scan every 6hours and in and out cath for >300cc.    Problem: Education: Goal: Knowledge of General Education information will improve Description: Including pain rating scale, medication(s)/side effects and non-pharmacologic comfort measures Outcome: Progressing   Problem: Health Behavior/Discharge Planning: Goal: Ability to manage health-related needs will improve Outcome: Progressing   Problem: Clinical Measurements: Goal: Ability to maintain clinical measurements within normal limits will improve Outcome: Progressing Goal: Will remain free from infection Outcome: Progressing Goal: Diagnostic test results will improve Outcome: Progressing Goal: Respiratory complications will improve Outcome: Progressing Goal: Cardiovascular complication will be avoided Outcome: Progressing   Problem: Activity: Goal: Risk for activity intolerance will decrease Outcome: Progressing   Problem: Nutrition: Goal: Adequate nutrition will be maintained Outcome: Progressing

## 2021-08-21 NOTE — Progress Notes (Signed)
Encouraged patient to attempt to urinate, bladder scan shows 312cc in bladder. Pt unable to void. In and out cath for 320cc of clear yellow urine. This is the second in and out cath since foley was removed.

## 2021-08-21 NOTE — Progress Notes (Signed)
PROGRESS NOTE    Travis Salazar.  BQ:[id number] DOB: 1936-08-03 DOA: 08/13/2021 PCP: [name], MD   Brief Narrative:  This 85 y.o. male with PMH significant of CVA, PE on Eliquis, hypertension presented with c/o: Left-sided weakness concerning for CVA but was found to have evidence of seizures and was in status epilepticus.  Patient was intubated secondary to inability to protect his airway.  Patient received Precedex, Keppra, Dilantin to control seizures.  Neurology was consulted and patient underwent LTM EEG.  Patient extubated successfully and  now seizures are controlled on Keppra.   Assessment & Plan:   Principal Problem:   Status epilepticus ([place]) Active Problems:   Essential (primary) hypertension   Pulmonary mass   Acute urinary retention  Status epilepticus:  Present on admission, Neurology consulted.  Patient was intubated for inability to protect airway and respiratory failure and was admitted in ICU.  Patient was managed with Precedex, Keppra 40 mg/kg load and dilantin.  LTM EEG significant for epileptogenicity arising from right hemisphere. Seizure likely secondary to prior stroke history.  Dilantin discontinued secondary to drug interaction with Eliquis. Neurology signed off. Continue Keppra 1000 mg BID; outpatient neurology follow-up.   Acute metabolic encephalopathy: > Resolved. Secondary to seizures/status epilepticus.   Acute hypoxic respiratory failure: > Resolved. Patient required intubation on 8/29.   Successfully extubated on 8/31.  Now on room air.   History of pulmonary embolism Continue Eliquis   Right upper lobe mass: Concern for cancer but patient has decided on not pursue diagnosis. Follow-up as an outpatient.   Essential hypertension Continue amlodipine, hydralazine, HCTZ   Acute urinary retention : Foley catheter placed on 8/31 Patient has been having issues with voiding.  Continue Flomax, urocholine. Urology consulted,   awaiting recommendation.  ESBL UTI: Urine culture significant for ESBL (40k colonies) Ceftriaxone changed to meropenem. Discussed with ID, recommended since patient has no symptoms , No need for antibiotics. Patient has received 3 days of IV antibiotics that should be sufficient.  Antibiotic discontinued.    DVT prophylaxis: Eliquis Code Status: Full code Family Communication: No family at bedside Disposition Plan:    Status is: Inpatient  Remains inpatient appropriate because:Inpatient level of care appropriate due to severity of illness  Dispo: The patient is from: Home              Anticipated d/c is to: Home Health services.              Patient currently is not medically stable to d/c.   Difficult to place patient No  Consultants:  PCCM Neurology  Procedures: Long-term EEG Antimicrobials:  Anti-infectives (From admission, onward)    Start     Dose/Rate Route Frequency Ordered Stop   08/20/21 1430  meropenem (MERREM) 1 g in sodium chloride 0.9 % 100 mL IVPB  Status:  Discontinued        1 g 200 mL/hr over 30 Minutes Intravenous Every 8 hours 08/20/21 1342 08/21/21 1005   08/19/21 1430  cefTRIAXone (ROCEPHIN) 1 g in sodium chloride 0.9 % 100 mL IVPB  Status:  Discontinued        1 g 200 mL/hr over 30 Minutes Intravenous Every 24 hours 08/19/21 1335 08/20/21 1342       Subjective: Patient was seen and examined at bedside.  Overnight events noted.   Patient reports feeling much improved,  states seizures are well controlled.   Patient still unable to urinate on his own,  requiring in and  out catheterization.  Objective: Vitals:   08/21/21 0404 08/21/21 0727 08/21/21 0831 08/21/21 1117  BP: 116/72 107/67 112/67 121/66  Pulse:  78  73  Resp: '16 16  15  '$ Temp: 98.5 F (36.9 C) 98.3 F (36.8 C)  97.6 F (36.4 C)  TempSrc: Oral Axillary  Oral  SpO2:  96%  93%  Weight:      Height:        Intake/Output Summary (Last 24 hours) at 08/21/2021 1203 Last data filed  at 08/21/2021 1100 Gross per 24 hour  Intake 748.01 ml  Output 595 ml  Net 153.01 ml   Filed Weights   08/14/21 0415 08/18/21 0500 08/19/21 0500  Weight: 89.5 kg 89.5 kg 89.5 kg    Examination:  General exam: Appears comfortable, not in any acute distress. Respiratory system: Clear to auscultation bilaterally, no wheezing.  No crackles Cardiovascular system: S1-S2 heard, regular rate and rhythm, no murmur Gastrointestinal system: Abdomen is soft, nontender, nondistended, BS + Central nervous system: Alert and oriented x 3. No focal neurological deficits. Extremities: No edema, no cyanosis, no clubbing. Skin: No rashes, lesions or ulcers Psychiatry: Judgement and insight appear normal. Mood & affect appropriate.     Data Reviewed: I have personally reviewed following labs and imaging studies  CBC: Recent Labs  Lab 08/15/21 0409  WBC 9.3  HGB 12.1*  HCT 36.9*  MCV 91.3  PLT [id number]*   Basic Metabolic Panel: Recent Labs  Lab 08/15/21 0409  NA 135  K 3.7  CL 103  CO2 24  GLUCOSE 119*  BUN 19  CREATININE 0.77  CALCIUM 9.3   GFR: Estimated Creatinine Clearance: 77.3 mL/min (by C-G formula based on SCr of 0.77 mg/dL). Liver Function Tests: No results for input(s): AST, ALT, ALKPHOS, BILITOT, PROT, ALBUMIN in the last 168 hours.  No results for input(s): LIPASE, AMYLASE in the last 168 hours. No results for input(s): AMMONIA in the last 168 hours.  Coagulation Profile: No results for input(s): INR, PROTIME in the last 168 hours.  Cardiac Enzymes: No results for input(s): CKTOTAL, CKMB, CKMBINDEX, TROPONINI in the last 168 hours. BNP (last 3 results) No results for input(s): PROBNP in the last 8760 hours. HbA1C: No results for input(s): HGBA1C in the last 72 hours. CBG: No results for input(s): GLUCAP in the last 168 hours.  Lipid Profile: No results for input(s): CHOL, HDL, LDLCALC, TRIG, CHOLHDL, LDLDIRECT in the last 72 hours. Thyroid Function Tests: No  results for input(s): TSH, T4TOTAL, FREET4, T3FREE, THYROIDAB in the last 72 hours. Anemia Panel: No results for input(s): VITAMINB12, FOLATE, FERRITIN, TIBC, IRON, RETICCTPCT in the last 72 hours. Sepsis Labs: No results for input(s): PROCALCITON, LATICACIDVEN in the last 168 hours.  Recent Results (from the past 240 hour(s))  Resp Panel by RT-PCR (Flu A&B, Covid) Nasopharyngeal Swab     Status: None   Collection Time: 08/13/21  4:14 PM   Specimen: Nasopharyngeal Swab; Nasopharyngeal(NP) swabs in vial transport medium  Result Value Ref Range Status   SARS Coronavirus 2 by RT PCR NEGATIVE NEGATIVE Final    Comment: (NOTE) SARS-CoV-2 target nucleic acids are NOT DETECTED.  The SARS-CoV-2 RNA is generally detectable in upper respiratory specimens during the acute phase of infection. The lowest concentration of SARS-CoV-2 viral copies this assay can detect is 138 copies/mL. A negative result does not preclude SARS-Cov-2 infection and should not be used as the sole basis for treatment or other patient management decisions. A negative result may occur with  improper specimen collection/handling, submission of specimen other than nasopharyngeal swab, presence of viral mutation(s) within the areas targeted by this assay, and inadequate number of viral copies(<138 copies/mL). A negative result must be combined with clinical observations, patient history, and epidemiological information. The expected result is Negative.  Fact Sheet for Patients:  EntrepreneurPulse.com.au  Fact Sheet for Healthcare Providers:  IncredibleEmployment.be  This test is no t yet approved or cleared by the [place] FDA and  has been authorized for detection and/or diagnosis of SARS-CoV-2 by FDA under an Emergency Use Authorization (EUA). This EUA will remain  in effect (meaning this test can be used) for the duration of the COVID-19 declaration under Section 564(b)(1) of  the Act, 21 U.S.C.section 360bbb-3(b)(1), unless the authorization is terminated  or revoked sooner.       Influenza A by PCR NEGATIVE NEGATIVE Final   Influenza B by PCR NEGATIVE NEGATIVE Final    Comment: (NOTE) The Xpert Xpress SARS-CoV-2/FLU/RSV plus assay is intended as an aid in the diagnosis of influenza from Nasopharyngeal swab specimens and should not be used as a sole basis for treatment. Nasal washings and aspirates are unacceptable for Xpert Xpress SARS-CoV-2/FLU/RSV testing.  Fact Sheet for Patients: EntrepreneurPulse.com.au  Fact Sheet for Healthcare Providers: IncredibleEmployment.be  This test is not yet approved or cleared by the [place] FDA and has been authorized for detection and/or diagnosis of SARS-CoV-2 by FDA under an Emergency Use Authorization (EUA). This EUA will remain in effect (meaning this test can be used) for the duration of the COVID-19 declaration under Section 564(b)(1) of the Act, 21 U.S.C. section 360bbb-3(b)(1), unless the authorization is terminated or revoked.  Performed at [facility], [place] [address], [place], [place] [address]   MRSA Next Gen by PCR, Nasal     Status: None   Collection Time: 08/13/21 10:19 PM   Specimen: Nasal Mucosa; Nasal Swab  Result Value Ref Range Status   MRSA by PCR Next Gen NOT DETECTED NOT DETECTED Final    Comment: (NOTE) The GeneXpert MRSA Assay (FDA approved for NASAL specimens only), is one component of a comprehensive MRSA colonization surveillance program. It is not intended to diagnose MRSA infection nor to guide or monitor treatment for MRSA infections. Test performance is not FDA approved in patients less than 35 years old. Performed at [facility], [place] [address] [place], [place] [address]   Urine Culture     Status: Abnormal   Collection Time: 08/18/21  9:57 AM   Specimen: Urine, Clean Catch  Result Value Ref Range Status   Specimen  Description URINE, CLEAN CATCH  Final   Special Requests   Final    NONE Performed at [facility], [place] [address], [place], [place] [address]    Culture (A)  Final    40,000 COLONIES/mL ESCHERICHIA COLI Confirmed Extended Spectrum Beta-Lactamase Producer (ESBL).  In bloodstream infections from ESBL organisms, carbapenems are preferred over piperacillin/tazobactam. They are shown to have a lower risk of mortality.    Report Status 08/20/2021 FINAL  Final   Organism ID, Bacteria ESCHERICHIA COLI (A)  Final      Susceptibility   Escherichia coli - MIC*    AMPICILLIN >=32 RESISTANT Resistant     CEFAZOLIN >=64 RESISTANT Resistant     CEFEPIME 16 RESISTANT Resistant     CEFTRIAXONE >=64 RESISTANT Resistant     CIPROFLOXACIN >=4 RESISTANT Resistant     GENTAMICIN <=1 SENSITIVE Sensitive  IMIPENEM <=0.25 SENSITIVE Sensitive     NITROFURANTOIN 128 RESISTANT Resistant     TRIMETH/SULFA >=320 RESISTANT Resistant     AMPICILLIN/SULBACTAM 16 INTERMEDIATE Intermediate     PIP/TAZO <=4 SENSITIVE Sensitive     * 40,000 COLONIES/mL ESCHERICHIA COLI    Radiology Studies: No results found.  Scheduled Meds:  amLODipine  10 mg Oral Daily   apixaban  5 mg Oral BID   atorvastatin  40 mg Oral q1800   bethanechol  10 mg Oral TID   chlorhexidine  15 mL Mouth Rinse BID   Chlorhexidine Gluconate Cloth  6 each Topical Daily   docusate  100 mg Oral BID   levETIRAcetam  1,000 mg Oral BID   mouth rinse  15 mL Mouth Rinse q12n4p   metoprolol tartrate  12.5 mg Oral BID   polyethylene glycol  17 g Oral Daily   tamsulosin  0.4 mg Oral Daily   Continuous Infusions:     LOS: 8 days    Time spent: 25 mins    Shawna Clamp, MD [facility]   If 7PM-7AM, please contact night-coverage

## 2021-08-21 NOTE — Consult Note (Signed)
[facility] for Infectious [facility]    Date of Admission:  08/13/2021   Total days of antibiotics 3        Ceftriaxone > meropenem       Reason for Consult: ESBL E. Coli in urine culture    Referring Provider: Dr. Dwyane Dee  Assessment: Patient with history of right MCA infarct with he was hospitalized for status epilepticus.  He was intubated for airway protection extubated.  His seizure is likely related to his previous right-sided massive CVA.   Urine culture grew 40K colonies of ESBL E. coli.  UA however was unremarkable with negative leukocytes and nitrites x 2.  Patient does not have any urinary symptoms.  Do not believe that this is a urinary tract infection.  He already received 3 days of antibiotic which should be sufficient.  Do not recommend any further antibiotic.  Plan: Stop meropenem  Principal Problem:   Status epilepticus ([place]) Active Problems:   Essential (primary) hypertension   Pulmonary mass   Acute urinary retention   Scheduled Meds:  amLODipine  10 mg Oral Daily   apixaban  5 mg Oral BID   atorvastatin  40 mg Oral q1800   bethanechol  10 mg Oral TID   chlorhexidine  15 mL Mouth Rinse BID   Chlorhexidine Gluconate Cloth  6 each Topical Daily   docusate  100 mg Oral BID   levETIRAcetam  1,000 mg Oral BID   mouth rinse  15 mL Mouth Rinse q12n4p   metoprolol tartrate  12.5 mg Oral BID   polyethylene glycol  17 g Oral Daily   tamsulosin  0.4 mg Oral Daily   Continuous Infusions:  meropenem (MERREM) IV 1 g (08/21/21 0548)   PRN Meds:.hydrALAZINE, melatonin  HPI: Travis Salazar. is a 85 y.o. male with past medical history of right MCA infarct, hypertension who represents to the hospital for left-sided weakness, found to have status epilepticus.  He was intubated for airway protection and was extubated.  UA was performed to rule out infectious cause and came back unremarkable.  Urine culture grew 40 K colonies of ESBL E. coli.  Patient is seen at  bedside today.  He reports feeling well with no acute distress.  He denies dysuria, urgency, frequency, hematuria, suprapubic or flank pain.  He denies fever or chills.  Denies any new neurological deficit.  Patient is eager to go home.   Review of Systems: ROS Per HPI  Past Medical History:  Diagnosis Date   Hyperlipidemia    Hypertension    Stroke [facility])     Social History   Tobacco Use   Smoking status: Former    Packs/day: 1.00    Years: 50.00    Pack years: 50.00    Types: Cigarettes    Quit date: 12/15/2009    Years since quitting: 11.6   Smokeless tobacco: Never  Vaping Use   Vaping Use: Never used  Substance Use Topics   Alcohol use: No    Alcohol/week: 0.0 standard drinks   Drug use: No    Family History  Problem Relation Age of Onset   COPD Mother    Heart attack Father    No Known Allergies  OBJECTIVE: Blood pressure 112/67, pulse 78, temperature 98.3 F (36.8 C), temperature source Axillary, resp. rate 16, height '5\' 8"'$  (1.727 m), weight 89.5 kg, SpO2 96 %.  Physical Exam Constitutional:      General: He is not  in acute distress.    Appearance: He is not toxic-appearing.  HENT:     Head: Normocephalic.  Eyes:     General: No scleral icterus.    Conjunctiva/sclera: Conjunctivae normal.  Cardiovascular:     Rate and Rhythm: Normal rate and regular rhythm.     Heart sounds: Normal heart sounds.  Pulmonary:     Effort: Pulmonary effort is normal. No respiratory distress.     Breath sounds: Normal breath sounds.  Abdominal:     General: There is no distension.     Tenderness: There is no abdominal tenderness.     Comments: No suprapubic tenderness  Musculoskeletal:     Cervical back: Normal range of motion.  Skin:    General: Skin is warm.     Coloration: Skin is not jaundiced.  Neurological:     Mental Status: He is alert and oriented to person, place, and time.  Psychiatric:        Mood and Affect: Mood normal.        Behavior: Behavior  normal.    Lab Results Lab Results  Component Value Date   WBC 9.3 08/15/2021   HGB 12.1 (L) 08/15/2021   HCT 36.9 (L) 08/15/2021   MCV 91.3 08/15/2021   PLT 106 (L) 08/15/2021    Lab Results  Component Value Date   CREATININE 0.77 08/15/2021   BUN 19 08/15/2021   NA 135 08/15/2021   K 3.7 08/15/2021   CL 103 08/15/2021   CO2 24 08/15/2021    Lab Results  Component Value Date   ALT 13 08/13/2021   AST 22 08/13/2021   ALKPHOS 46 08/13/2021   BILITOT 0.8 08/13/2021     Microbiology: Recent Results (from the past 240 hour(s))  Resp Panel by RT-PCR (Flu A&B, Covid) Nasopharyngeal Swab     Status: None   Collection Time: 08/13/21  4:14 PM   Specimen: Nasopharyngeal Swab; Nasopharyngeal(NP) swabs in vial transport medium  Result Value Ref Range Status   SARS Coronavirus 2 by RT PCR NEGATIVE NEGATIVE Final    Comment: (NOTE) SARS-CoV-2 target nucleic acids are NOT DETECTED.  The SARS-CoV-2 RNA is generally detectable in upper respiratory specimens during the acute phase of infection. The lowest concentration of SARS-CoV-2 viral copies this assay can detect is 138 copies/mL. A negative result does not preclude SARS-Cov-2 infection and should not be used as the sole basis for treatment or other patient management decisions. A negative result may occur with  improper specimen collection/handling, submission of specimen other than nasopharyngeal swab, presence of viral mutation(s) within the areas targeted by this assay, and inadequate number of viral copies(<138 copies/mL). A negative result must be combined with clinical observations, patient history, and epidemiological information. The expected result is Negative.  Fact Sheet for Patients:  EntrepreneurPulse.com.au  Fact Sheet for Healthcare Providers:  IncredibleEmployment.be  This test is no t yet approved or cleared by the [place] FDA and  has been authorized for  detection and/or diagnosis of SARS-CoV-2 by FDA under an Emergency Use Authorization (EUA). This EUA will remain  in effect (meaning this test can be used) for the duration of the COVID-19 declaration under Section 564(b)(1) of the Act, 21 U.S.C.section 360bbb-3(b)(1), unless the authorization is terminated  or revoked sooner.       Influenza A by PCR NEGATIVE NEGATIVE Final   Influenza B by PCR NEGATIVE NEGATIVE Final    Comment: (NOTE) The Xpert Xpress SARS-CoV-2/FLU/RSV plus assay is intended as an  aid in the diagnosis of influenza from Nasopharyngeal swab specimens and should not be used as a sole basis for treatment. Nasal washings and aspirates are unacceptable for Xpert Xpress SARS-CoV-2/FLU/RSV testing.  Fact Sheet for Patients: EntrepreneurPulse.com.au  Fact Sheet for Healthcare Providers: IncredibleEmployment.be  This test is not yet approved or cleared by the [place] FDA and has been authorized for detection and/or diagnosis of SARS-CoV-2 by FDA under an Emergency Use Authorization (EUA). This EUA will remain in effect (meaning this test can be used) for the duration of the COVID-19 declaration under Section 564(b)(1) of the Act, 21 U.S.C. section 360bbb-3(b)(1), unless the authorization is terminated or revoked.  Performed at [facility], [place] [address], [place], [place] [address]   MRSA Next Gen by PCR, Nasal     Status: None   Collection Time: 08/13/21 10:19 PM   Specimen: Nasal Mucosa; Nasal Swab  Result Value Ref Range Status   MRSA by PCR Next Gen NOT DETECTED NOT DETECTED Final    Comment: (NOTE) The GeneXpert MRSA Assay (FDA approved for NASAL specimens only), is one component of a comprehensive MRSA colonization surveillance program. It is not intended to diagnose MRSA infection nor to guide or monitor treatment for MRSA infections. Test performance is not FDA approved in patients less than 62  years old. Performed at [facility], [place] [address] [place], [place] [address]   Urine Culture     Status: Abnormal   Collection Time: 08/18/21  9:57 AM   Specimen: Urine, Clean Catch  Result Value Ref Range Status   Specimen Description URINE, CLEAN CATCH  Final   Special Requests   Final    NONE Performed at [facility], [place] [address] [place], [place] [address]    Culture (A)  Final    40,000 COLONIES/mL ESCHERICHIA COLI Confirmed Extended Spectrum Beta-Lactamase Producer (ESBL).  In bloodstream infections from ESBL organisms, carbapenems are preferred over piperacillin/tazobactam. They are shown to have a lower risk of mortality.    Report Status 08/20/2021 FINAL  Final   Organism ID, Bacteria ESCHERICHIA COLI (A)  Final      Susceptibility   Escherichia coli - MIC*    AMPICILLIN >=32 RESISTANT Resistant     CEFAZOLIN >=64 RESISTANT Resistant     CEFEPIME 16 RESISTANT Resistant     CEFTRIAXONE >=64 RESISTANT Resistant     CIPROFLOXACIN >=4 RESISTANT Resistant     GENTAMICIN <=1 SENSITIVE Sensitive     IMIPENEM <=0.25 SENSITIVE Sensitive     NITROFURANTOIN 128 RESISTANT Resistant     TRIMETH/SULFA >=320 RESISTANT Resistant     AMPICILLIN/SULBACTAM 16 INTERMEDIATE Intermediate     PIP/TAZO <=4 SENSITIVE Sensitive     * 40,000 COLONIES/mL ESCHERICHIA COLI    Gaylan Gerold, [place] [facility] Infectious Disease [facility] [phone] pager    08/21/2021, 8:50 AM

## 2021-08-21 NOTE — Consult Note (Signed)
Urology Consult Note   Requesting Attending Physician:  Shawna Clamp, MD Service Providing Consult: Urology  Consulting Attending: Dr. Louis Meckel   Reason for Consult:  Urinary retention  HPI: Travis Lora. is seen in consultation for reasons noted above at the request of Shawna Clamp, MD for evaluation of urinary retention.  This is a 85 y.o. male with Hx of R MCA stroke, PE on Eliquis, HTN, RUL lung mass (concern for cancer but refusing further workup at this time) who was admitted on 08/13/21 for status epilepticus and new onset seizures requiring intubation. Now extubated and no new seizures on Keppra.   While admitted Foley placement on 08/15/21. Underwent TOV, but unsuccessful. Had to have catheter replaced on 08/21/21. Has been on Flomax 0.4 mg daily since 08/18/21.   He was also incidentally found to have ESBL UTI and received 3 days of IV antibiotics. He was otherwise asymptomatic, so a longer course was not recommended per ID.  Previous history of prostate cancer s/p open radical prostatectomy in the early 2000's. No voiding issues since that time, but he does endorse a prolonged stream while urinating. He denies a history of gross hematuria or UTI's.    Past Medical History: Past Medical History:  Diagnosis Date   Hyperlipidemia    Hypertension    Stroke [facility])     Past Surgical History:  Past Surgical History:  Procedure Laterality Date   LIPOMA EXCISION     neck   PROSTATECTOMY     due to prostate cancer    Medication: Current Facility-Administered Medications  Medication Dose Route Frequency Provider Last Rate Last Admin   amLODipine (NORVASC) tablet 10 mg  10 mg Oral Daily Gerald Leitz D, NP   10 mg at 08/21/21 0831   apixaban (ELIQUIS) tablet 5 mg  5 mg Oral BID Gerald Leitz D, NP   5 mg at 08/21/21 0831   atorvastatin (LIPITOR) tablet 40 mg  40 mg Oral q1800 Gerald Leitz D, NP   40 mg at 08/20/21 1754   bethanechol (URECHOLINE) tablet 10  mg  10 mg Oral TID Gerald Leitz D, NP   10 mg at 08/21/21 0831   chlorhexidine (PERIDEX) 0.12 % solution 15 mL  15 mL Mouth Rinse BID Gerald Leitz D, NP   15 mL at 08/21/21 [id number]   Chlorhexidine Gluconate Cloth 2 % PADS 6 each  6 each Topical Daily Maryjane Hurter, MD   6 each at 08/20/21 0852   docusate (COLACE) 50 MG/5ML liquid 100 mg  100 mg Oral BID Gerald Leitz D, NP   100 mg at 08/19/21 1102   hydrALAZINE (APRESOLINE) injection 10-20 mg  10-20 mg Intravenous Q4H PRN Corey Harold, NP       levETIRAcetam (KEPPRA) tablet 1,000 mg  1,000 mg Oral BID Jacky Kindle, MD   1,000 mg at 08/21/21 0831   MEDLINE mouth rinse  15 mL Mouth Rinse q12n4p Harris, Loree Fee D, NP   15 mL at 08/19/21 1539   melatonin tablet 3 mg  3 mg Oral QHS PRN Donnetta Simpers, MD   3 mg at 08/20/21 2152   metoprolol tartrate (LOPRESSOR) tablet 12.5 mg  12.5 mg Oral BID Gerald Leitz D, NP   12.5 mg at 08/21/21 0831   polyethylene glycol (MIRALAX / GLYCOLAX) packet 17 g  17 g Oral Daily Gerald Leitz D, NP   17 g at 08/19/21 1102   tamsulosin (FLOMAX) capsule 0.4 mg  0.4 mg Oral Daily  Mariel Aloe, MD   0.4 mg at 08/21/21 [id number]    Allergies: No Known Allergies  Social History: Social History   Tobacco Use   Smoking status: Former    Packs/day: 1.00    Years: 50.00    Pack years: 50.00    Types: Cigarettes    Quit date: 12/15/2009    Years since quitting: 11.6   Smokeless tobacco: Never  Vaping Use   Vaping Use: Never used  Substance Use Topics   Alcohol use: No    Alcohol/week: 0.0 standard drinks   Drug use: No    Family History Family History  Problem Relation Age of Onset   COPD Mother    Heart attack Father     Review of Systems 10 systems were reviewed and are negative except as noted specifically in the HPI.  Objective   Vital signs in last 24 hours: BP (!) 114/56 (BP Location: Left Arm)   Pulse 77   Temp 97.9 F (36.6 C) (Oral)   Resp 14   Ht '5\' 8"'$  (1.727 m)    Wt 89.5 kg   SpO2 94%   BMI 30.00 kg/m   Physical Exam General: NAD, A&O, resting, appropriate HEENT: [place]/AT, EOMI, MMM Pulmonary: Normal work of breathing Cardiovascular: HDS, adequate peripheral perfusion Abdomen: Soft, NTTP, nondistended, infraumbilical midline scar from prior prostatectomy. GU: Foley catheter in place draining CYU, no CVA tenderness Extremities: warm and well perfused Neuro: Appropriate, no focal neurological deficits  Most Recent Labs: Lab Results  Component Value Date   WBC 9.3 08/15/2021   HGB 12.1 (L) 08/15/2021   HCT 36.9 (L) 08/15/2021   PLT 106 (L) 08/15/2021    Lab Results  Component Value Date   NA 135 08/15/2021   K 3.7 08/15/2021   CL 103 08/15/2021   CO2 24 08/15/2021   BUN 19 08/15/2021   CREATININE 0.77 08/15/2021   CALCIUM 9.3 08/15/2021   MG 2.1 08/14/2021   PHOS 4.3 08/14/2021    Lab Results  Component Value Date   INR 1.0 08/13/2021   APTT 23 (L) 08/13/2021     Urine Culture: 08/18/21 - 40k ESBL E. Coli   IMAGING: No results found.  ------  Assessment:  85 y.o. male with Hx of R MCA stroke, PE on Eliquis, HTN, RUL lung mass (concern for cancer but refusing further workup at this time), PCa s/p open prostatectomy (~ 2000) who was admitted on 08/13/21 for status epilepticus and new onset seizures requiring intubation. Now extubated and no new seizures on Keppra. Also with urinary retention w/ multiple failed TOV's.   Urinary retention likely related to acute illness and overall weakness. Should improve with bladder rest and progression of physical therapy.   Less likely etiology being bladder neck contracture due to history of radical prostatectomy or neurogenic bladder related to strokes/seizures. Will require further workup as an outpatient if unable to pass TOV.   Recommendations: - Continue Foley catheter x7-10 days - Follow up with Urology as outpatient for catheter removal and TOV - Continue Flomax 0.4 mg  daily   Thank you for this consult. Please contact the urology consult pager with any further questions/concerns.  Reola Mosher, MD [facility] Urology Resident, [facility]

## 2021-08-22 ENCOUNTER — Inpatient Hospital Stay (HOSPITAL_COMMUNITY): Payer: Medicare Other

## 2021-08-22 LAB — CBC WITH DIFFERENTIAL/PLATELET
Abs Immature Granulocytes: 0.06 10*3/uL (ref 0.00–0.07)
Basophils Absolute: 0 10*3/uL (ref 0.0–0.1)
Basophils Relative: 0 %
Eosinophils Absolute: 0.3 10*3/uL (ref 0.0–0.5)
Eosinophils Relative: 4 %
HCT: 37.6 % — ABNORMAL LOW (ref 39.0–52.0)
Hemoglobin: 12.2 g/dL — ABNORMAL LOW (ref 13.0–17.0)
Immature Granulocytes: 1 %
Lymphocytes Relative: 23 %
Lymphs Abs: 1.8 10*3/uL (ref 0.7–4.0)
MCH: 29.7 pg (ref 26.0–34.0)
MCHC: 32.4 g/dL (ref 30.0–36.0)
MCV: 91.5 fL (ref 80.0–100.0)
Monocytes Absolute: 0.8 10*3/uL (ref 0.1–1.0)
Monocytes Relative: 10 %
Neutro Abs: 4.8 10*3/uL (ref 1.7–7.7)
Neutrophils Relative %: 62 %
Platelets: 157 10*3/uL (ref 150–400)
RBC: 4.11 MIL/uL — ABNORMAL LOW (ref 4.22–5.81)
RDW: 14 % (ref 11.5–15.5)
WBC: 7.7 10*3/uL (ref 4.0–10.5)
nRBC: 0 % (ref 0.0–0.2)

## 2021-08-22 LAB — COMPREHENSIVE METABOLIC PANEL
ALT: 36 U/L (ref 0–44)
AST: 38 U/L (ref 15–41)
Albumin: 2.6 g/dL — ABNORMAL LOW (ref 3.5–5.0)
Alkaline Phosphatase: 47 U/L (ref 38–126)
Anion gap: 7 (ref 5–15)
BUN: 16 mg/dL (ref 8–23)
CO2: 27 mmol/L (ref 22–32)
Calcium: 8.7 mg/dL — ABNORMAL LOW (ref 8.9–10.3)
Chloride: 99 mmol/L (ref 98–111)
Creatinine, Ser: 0.67 mg/dL (ref 0.61–1.24)
GFR, Estimated: >60 mL/min (ref >60)
Glucose, Bld: 90 mg/dL (ref 70–99)
Potassium: 3.7 mmol/L (ref 3.5–5.1)
Sodium: 133 mmol/L — ABNORMAL LOW (ref 135–145)
Total Bilirubin: 0.7 mg/dL (ref 0.3–1.2)
Total Protein: 5.7 g/dL — ABNORMAL LOW (ref 6.5–8.1)

## 2021-08-22 NOTE — Progress Notes (Signed)
PROGRESS NOTE    Travis Salazar.  [id number] DOB: 1936-07-13 DOA: 08/13/2021 PCP: [name], MD   Chief Complaint  Patient presents with   Code Stroke   Brief Narrative:  This 85 y.o. male with PMH significant of CVA, PE on Eliquis, hypertension presented with c/o: Left-sided weakness concerning for CVA but was found to have evidence of seizures and was in status epilepticus.  Patient was intubated secondary to inability to protect his airway.  Patient received Precedex, Keppra, Dilantin to control seizures.  Neurology was consulted and patient underwent LTM EEG.  Patient extubated successfully and  now seizures are controlled on Keppra.  Assessment & Plan:   Principal Problem:   Status epilepticus ([place]) Active Problems:   Essential (primary) hypertension   Pulmonary mass   Acute urinary retention  Status epilepticus:  Present on admission, Neurology consulted.  Patient was intubated for inability to protect airway and respiratory failure and was admitted in ICU.  Patient was managed with Precedex, Keppra 40 mg/kg load and dilantin.  LTM EEG significant for epileptogenicity arising from right hemisphere. Seizure likely secondary to prior stroke history.  Dilantin discontinued secondary to drug interaction with Eliquis. Neurology signed off. Continue Keppra 1000 mg BID; outpatient neurology follow-up.   Acute metabolic encephalopathy: > Resolved. Secondary to seizures/status epilepticus.   Acute hypoxic respiratory failure: > Resolved. Patient required intubation on 8/29.   Successfully extubated on 8/31.  Now on room air.   History of pulmonary embolism Continue Eliquis   Right upper lobe mass: Concern for cancer but patient has decided on not pursue diagnosis. Follow-up as an outpatient - discussed with daughter.   Essential hypertension Continue amlodipine, hydralazine, HCTZ   Acute urinary retention : Foley catheter placed on 8/31 Patient has been  having issues with voiding.  Continue Flomax, urocholine. Urology consulted, plan for foley 7-10 days, flomax 0.4 mg daily - needs follow up with urology outpatient for catheter removal and TOV   ESBL UTI: Urine culture significant for ESBL (40k colonies) Ceftriaxone changed to meropenem. Discussed with ID, recommended since patient has no symptoms , No need for antibiotics. Patient has received 3 days of IV antibiotics that should be sufficient.  Antibiotic discontinued.   Visual Deficits OT noted significant LUQ deficits - will repeat MRI Needs outpatient ophtho follow up  DVT prophylaxis: eliquis Code Status: full  Family Communication: daughter, wife over phone Disposition:   Status is: Inpatient  Remains inpatient appropriate because:Inpatient level of care appropriate due to severity of illness  Dispo: The patient is from: Home              Anticipated d/c is to: Home              Patient currently is not medically stable to d/c.   Difficult to place patient No       Consultants:  Neurology PCCM  Procedures:  EEG ABNORMALITY - Lateralized periodic discharges, right hemisphere, maximal right temporal region - Continuous slow, generalized    IMPRESSION: This study initially showed evidence of epileptogenicity and cortical dysfunction arising from right hemisphere, maximal right temporal region likely secondary to underlying structural abnormality, seizure.  There was also moderate to severe diffuse encephalopathy, nonspecific etiology but likely related to sedation. No seizures were seen throughout the recording.  ABNORMALITY - Lateralized periodic discharges, right hemisphere, maximal right temporal region - Continuous slow, generalized and lateralized right hemisphere   IMPRESSION: This study howed evidence of epileptogenicity and cortical dysfunction  arising from right hemisphere, maximal right temporal region likely secondary to underlying structural  abnormality, seizure. Of note, lateralized periodic discharges are on the ictal-interictal continuum with increased potential for seizure recurrence. There is also moderate to severe diffuse encephalopathy, nonspecific etiology but likely related to seizure. No seizures were seen throughout the recording.   Priyanka Barbra Sarks    Antimicrobials:  Anti-infectives (From admission, onward)    Start     Dose/Rate Route Frequency Ordered Stop   08/20/21 1430  meropenem (MERREM) 1 g in sodium chloride 0.9 % 100 mL IVPB  Status:  Discontinued        1 g 200 mL/hr over 30 Minutes Intravenous Every 8 hours 08/20/21 1342 08/21/21 1005   08/19/21 1430  cefTRIAXone (ROCEPHIN) 1 g in sodium chloride 0.9 % 100 mL IVPB  Status:  Discontinued        1 g 200 mL/hr over 30 Minutes Intravenous Every 24 hours 08/19/21 1335 08/20/21 1342        Subjective: No complaint  Objective: Vitals:   08/22/21 0749 08/22/21 0848 08/22/21 1142 08/22/21 1539  BP: 124/82 126/70 109/64 126/62  Pulse: 74  79 77  Resp: 14  (!) 23 17  Temp: 97.8 F (36.6 C)  98.8 F (37.1 C) 98.1 F (36.7 C)  TempSrc: Oral  Oral Oral  SpO2: 93%  100%   Weight:      Height:        Intake/Output Summary (Last 24 hours) at 08/22/2021 1912 Last data filed at 08/22/2021 1739 Gross per 24 hour  Intake --  Output 1050 ml  Net -1050 ml   Filed Weights   08/18/21 0500 08/19/21 0500 08/22/21 0518  Weight: 89.5 kg 89.5 kg 87.7 kg    Examination:  General exam: Appears calm and comfortable  Respiratory system: Clear to auscultation. Respiratory effort normal. Cardiovascular system: S1 & S2 heard, RRR.  Gastrointestinal system: Abdomen is nondistended, soft and nontender. Central nervous system: Alert and oriented. No focal neurological deficits. Extremities: no lee Skin: No rashes, lesions or ulcers Psychiatry: Judgement and insight appear normal. Mood & affect appropriate.     Data Reviewed: I have personally reviewed following  labs and imaging studies  CBC: Recent Labs  Lab 08/22/21 0829  WBC 7.7  NEUTROABS 4.8  HGB 12.2*  HCT 37.6*  MCV 91.5  PLT [id number]    Basic Metabolic Panel: Recent Labs  Lab 08/22/21 0829  NA 133*  K 3.7  CL 99  CO2 27  GLUCOSE 90  BUN 16  CREATININE 0.67  CALCIUM 8.7*    GFR: Estimated Creatinine Clearance: 76.6 mL/min (by C-G formula based on SCr of 0.67 mg/dL).  Liver Function Tests: Recent Labs  Lab 08/22/21 0829  AST 38  ALT 36  ALKPHOS 47  BILITOT 0.7  PROT 5.7*  ALBUMIN 2.6*    CBG: No results for input(s): GLUCAP in the last 168 hours.   Recent Results (from the past 240 hour(s))  Resp Panel by RT-PCR (Flu Mckinzee Spirito&B, Covid) Nasopharyngeal Swab     Status: None   Collection Time: 08/13/21  4:14 PM   Specimen: Nasopharyngeal Swab; Nasopharyngeal(NP) swabs in vial transport medium  Result Value Ref Range Status   SARS Coronavirus 2 by RT PCR NEGATIVE NEGATIVE Final    Comment: (NOTE) SARS-CoV-2 target nucleic acids are NOT DETECTED.  The SARS-CoV-2 RNA is generally detectable in upper respiratory specimens during the acute phase of infection. The lowest concentration of SARS-CoV-2 viral copies  this assay can detect is 138 copies/mL. Jazzelle Zhang negative result does not preclude SARS-Cov-2 infection and should not be used as the sole basis for treatment or other patient management decisions. Otisha Spickler negative result may occur with  improper specimen collection/handling, submission of specimen other than nasopharyngeal swab, presence of viral mutation(s) within the areas targeted by this assay, and inadequate number of viral copies(<138 copies/mL). Ellisa Devivo negative result must be combined with clinical observations, patient history, and epidemiological information. The expected result is Negative.  Fact Sheet for Patients:  EntrepreneurPulse.com.au  Fact Sheet for Healthcare Providers:  IncredibleEmployment.be  This test is no t yet  approved or cleared by the [place] FDA and  has been authorized for detection and/or diagnosis of SARS-CoV-2 by FDA under an Emergency Use Authorization (EUA). This EUA will remain  in effect (meaning this test can be used) for the duration of the COVID-19 declaration under Section 564(b)(1) of the Act, 21 U.S.C.section 360bbb-3(b)(1), unless the authorization is terminated  or revoked sooner.       Influenza Majel Giel by PCR NEGATIVE NEGATIVE Final   Influenza B by PCR NEGATIVE NEGATIVE Final    Comment: (NOTE) The Xpert Xpress SARS-CoV-2/FLU/RSV plus assay is intended as an aid in the diagnosis of influenza from Nasopharyngeal swab specimens and should not be used as Jasmyn Picha sole basis for treatment. Nasal washings and aspirates are unacceptable for Xpert Xpress SARS-CoV-2/FLU/RSV testing.  Fact Sheet for Patients: EntrepreneurPulse.com.au  Fact Sheet for Healthcare Providers: IncredibleEmployment.be  This test is not yet approved or cleared by the [place] FDA and has been authorized for detection and/or diagnosis of SARS-CoV-2 by FDA under an Emergency Use Authorization (EUA). This EUA will remain in effect (meaning this test can be used) for the duration of the COVID-19 declaration under Section 564(b)(1) of the Act, 21 U.S.C. section 360bbb-3(b)(1), unless the authorization is terminated or revoked.  Performed at [facility], [place] [address], [place], [place] [address]   MRSA Next Gen by PCR, Nasal     Status: None   Collection Time: 08/13/21 10:19 PM   Specimen: Nasal Mucosa; Nasal Swab  Result Value Ref Range Status   MRSA by PCR Next Gen NOT DETECTED NOT DETECTED Final    Comment: (NOTE) The GeneXpert MRSA Assay (FDA approved for NASAL specimens only), is one component of Verle Brillhart comprehensive MRSA colonization surveillance program. It is not intended to diagnose MRSA infection nor to guide or monitor treatment for MRSA  infections. Test performance is not FDA approved in patients less than 20 years old. Performed at [facility], [place] [address], [place], [place] [address]   Urine Culture     Status: Abnormal   Collection Time: 08/18/21  9:57 AM   Specimen: Urine, Clean Catch  Result Value Ref Range Status   Specimen Description URINE, CLEAN CATCH  Final   Special Requests   Final    NONE Performed at [facility], [place] [address], [place], [place] [address]    Culture (Rhetta Cleek)  Final    40,000 COLONIES/mL ESCHERICHIA COLI Confirmed Extended Spectrum Beta-Lactamase Producer (ESBL).  In bloodstream infections from ESBL organisms, carbapenems are preferred over piperacillin/tazobactam. They are shown to have Laporche Martelle lower risk of mortality.    Report Status 08/20/2021 FINAL  Final   Organism ID, Bacteria ESCHERICHIA COLI (Kamalei Roeder)  Final      Susceptibility   Escherichia coli - MIC*    AMPICILLIN >=32 RESISTANT Resistant     CEFAZOLIN >=  64 RESISTANT Resistant     CEFEPIME 16 RESISTANT Resistant     CEFTRIAXONE >=64 RESISTANT Resistant     CIPROFLOXACIN >=4 RESISTANT Resistant     GENTAMICIN <=1 SENSITIVE Sensitive     IMIPENEM <=0.25 SENSITIVE Sensitive     NITROFURANTOIN 128 RESISTANT Resistant     TRIMETH/SULFA >=320 RESISTANT Resistant     AMPICILLIN/SULBACTAM 16 INTERMEDIATE Intermediate     PIP/TAZO <=4 SENSITIVE Sensitive     * 40,000 COLONIES/mL ESCHERICHIA COLI         Radiology Studies: No results found.      Scheduled Meds:  amLODipine  10 mg Oral Daily   apixaban  5 mg Oral BID   atorvastatin  40 mg Oral q1800   bethanechol  10 mg Oral TID   Chlorhexidine Gluconate Cloth  6 each Topical Daily   docusate  100 mg Oral BID   levETIRAcetam  1,000 mg Oral BID   metoprolol tartrate  12.5 mg Oral BID   polyethylene glycol  17 g Oral Daily   tamsulosin  0.4 mg Oral Daily   Continuous Infusions:   LOS: 9 days    Time spent: over 30 min    Fayrene Helper, MD [facility]   To contact the attending provider between 7A-7P or the covering provider during after hours 7P-7A, please log into the web site [email or web address] and access using universal [place] password for that web site. If you do not have the password, please call the hospital operator.  08/22/2021, 7:12 PM

## 2021-08-22 NOTE — Progress Notes (Addendum)
Physical Therapy Treatment Patient Details Name: Travis Salazar. MRN: [id number] DOB: [birth year] Today's Date: 08/22/2021    History of Present Illness Pt is an 85 year old male admitted 08/13/21 for status epilepticus, new onset seizure disorder. Intubated 8/29-8/31. MRI negative for acute abnormality. PMH: R MCA stroke, PE on Eliquis, HTN, RUL mass (suspect lung CA)    PT Comments    Pt continues to show impairments in balance, safe gait, and cognition (superimposed on significant visual deficits).  He has poor insight into his deficits and safety related to those deficits.  I spoke with the MD, Dr. Florene Glen and his Daughter, Travis Salazar.  We all agree he could benefit from SNF level rehab, but [name] needs some convincing.    Follow Up Recommendations  Home health PT;Supervision/Assistance - 24 hour (If pt agreeable, SNF for rehab would be safer)     Equipment Recommendations  None recommended by PT (pt report he has a walker at home)    Recommendations for Other Services       Precautions / Restrictions Precautions Precautions: Fall Precaution Comments: Pt impulsive with poor safety awareness    Mobility  Bed Mobility Overal bed mobility: Modified Independent                  Transfers Overall transfer level: Needs assistance Equipment used: Rolling walker (2 wheeled);None;1 person hand held assist Transfers: Sit to/from Stand Sit to Stand: Min assist;Min guard         General transfer comment: Min guard assist with RW, min hand held assist without device  Ambulation/Gait Ambulation/Gait assistance: Min assist;Min guard Gait Distance (Feet): 150 Feet Assistive device: Rolling walker (2 wheeled);1 person hand held assist Gait Pattern/deviations: Step-through pattern;Staggering left;Staggering right     General Gait Details: Initially, I had pt choose if he needed the RW to walk and he said "no", so we got up without it and walked around the room, he was staggering  and reaching for support surfaces.  He did come to his own conclusion that he should use the RW for hallway ambulation.  He was min assist without device and min guard assist with device as he has unsafe use (despite cues).  We worked on path finding and he was able to recall his room # at 3 mins, however, despite standing directly in front of his room sign, he could not see it to tell that it was his room.   Stairs             Wheelchair Mobility    Modified Rankin (Stroke Patients Only)       Balance Overall balance assessment: Needs assistance Sitting-balance support: Feet supported;No upper extremity supported Sitting balance-Leahy Scale: Good     Standing balance support: Single extremity supported Standing balance-Leahy Scale: Poor Standing balance comment: needs at least one hand support for balance, worked at sink on cleaning his glasses and he was unable to find the towel or sink that were directly in front of him without help.             High level balance activites: Other (comment) (catching oversize beach ball with cube inside that has numbers on it)              Cognition Arousal/Alertness: Awake/alert Behavior During Therapy: Impulsive Overall Cognitive Status: Impaired/Different from baseline Area of Impairment: Awareness;Problem solving;Safety/judgement                   Current Attention Level: Selective  Safety/Judgement: Decreased awareness of safety;Decreased awareness of deficits Awareness: Emergent Problem Solving: Requires verbal cues;Difficulty sequencing General Comments: Pt with poor insight on how his currently presenting deficits affect his safety.      Exercises      General Comments General comments (skin integrity, edema, etc.): I spoke at length with daughter who reports she lives in [place] but has been here a while looking after her parents (her work is remote and her husband is understanding).  She would like  for her dad to get some rehab and I agree that this is the best plan, but am having a hard time convincing him of going anywhere but home.  Dr. Florene Glen said that he would try again this afternoon.  Daughter is aware that if he continues to refuse that he could come home with post acute therapy and that follow up with his eye doctor is likely a good idea as his vision is likely singnificantly worse.  I told her about my recommendation for him to use a RW for balance and that he seemed agreeable to this plan and did recognize he was unsteady without it.      Pertinent Vitals/Pain      Home Living                      Prior Function            PT Goals (current goals can now be found in the care plan section) Acute Rehab PT Goals Patient Stated Goal: to go home today Progress towards PT goals: Progressing toward goals    Frequency    Min 3X/week      PT Plan Current plan remains appropriate    Co-evaluation              AM-PAC PT "6 Clicks" Mobility   Outcome Measure  Help needed turning from your back to your side while in a flat bed without using bedrails?: None Help needed moving from lying on your back to sitting on the side of a flat bed without using bedrails?: None Help needed moving to and from a bed to a chair (including a wheelchair)?: A Little Help needed standing up from a chair using your arms (e.g., wheelchair or bedside chair)?: A Little Help needed to walk in hospital room?: A Little Help needed climbing 3-5 steps with a railing? : A Little 6 Click Score: 20    End of Session Equipment Utilized During Treatment: Gait belt Activity Tolerance: Patient limited by fatigue Patient left: in bed;Other (comment) (seated EOB) Nurse Communication: Mobility status PT Visit Diagnosis: Unsteadiness on feet (R26.81);Other abnormalities of gait and mobility (R26.89);Muscle weakness (generalized) (M62.81)     Time: 1407-1500 PT Time Calculation (min)  (ACUTE ONLY): 53 min  Charges:  $Gait Training: 23-37 mins $Therapeutic Activity: 23-37 mins                    Verdene Lennert, PT, DPT  Acute Rehabilitation Ortho Tech Supervisor [phone] pager [phone]) [phone] office

## 2021-08-22 NOTE — Care Management Important Message (Signed)
Important Message  Patient Details  Name: Travis Salazar. MRN: [id number] Date of Birth: 1936-05-12   Medicare Important Message Given:  Yes     [name] 08/22/2021, 8:23 AM

## 2021-08-22 NOTE — Progress Notes (Signed)
Occupational Therapy Treatment Patient Details Name: Travis Salazar. MRN: [id number] DOB: 1936-08-28 Today's Date: 08/22/2021    History of present illness Pt is an 85 year old male admitted 08/13/21 for status epilepticus, new onset seizure disorder. Intubated 8/29-8/31. MRI negative for acute abnormality. PMH: R MCA stroke, PE on Eliquis, HTN, RUL mass (suspect lung CA)   OT comments  Pt demonstrates hx of glaucoma ( daughter reinforces poor vision to PT [place]). Pt with poor scanning / problem solving to search for road sign on 8 by 11 paper. Pt with significant L upper quadrant deficits noted. Pt decrease awareness to fall risk and states "I havent fallen at home" . Pt will benefit from familiar environment and will be able to show higher level of independence but still caution family for the need to (A) patient with transfers at this time encouraging RW use. Recommendation for visual scanning/ low vision education continuation in next venue.   Follow Up Recommendations  Supervision - Intermittent;Home health OT    Equipment Recommendations  Tub/shower seat    Recommendations for Other Services Other (comment) (outpatient OT to help with visual scanning)    Precautions / Restrictions Precautions Precautions: Fall       Mobility Bed Mobility Overal bed mobility: Modified Independent                  Transfers Overall transfer level: Needs assistance   Transfers: Sit to/from Stand Sit to Stand: Min guard         General transfer comment: requires RW for upright posture initially    Balance             Standing balance-Leahy Scale: Poor               High level balance activites: Other (comment) (catching oversize beach ball with cube inside that has numbers on it)             ADL either performed or assessed with clinical judgement   ADL Overall ADL's : Needs assistance/impaired Eating/Feeding: Set up   Grooming: Set up                    Toilet Transfer: Minimal assistance;Ambulation;RW             General ADL Comments: pt asked to static standing and catch ball to demonstrat balance. Initially the patient was almost handed the ball to help with sequence. pt struggled to read cube number that is inside the ball in a font well over 50 font size. pt with time able to read correctly with head tilts. pt with increased balance challenges in all planes and relied on RW more with the challenges. pt reaching for RW to balance and missing the ball. OT using this teachable moment to explain how reaching up in a cabinet or closet could cause the patient to fall. The need to use a RW and ask family for help for high objects. pt states "i see' "i can do that" Pt demonstrates deficulty reading a handout with road signs. pt asked initially to read any sign to see visual scanning. pt startes bottom middle of the page. pt then asked to pick a sign that would indicate deer crossing possible. pt needs increased time but able to locate within seconds. pt asked to find a image in the top left corner. pt requires >4 minutes to scand problem solve and locate the object. pt is able to recall the image requested but not visusally scan  for it until that time had passed. pt with time did complete task.     Vision       Perception     Praxis      Cognition Arousal/Alertness: Awake/alert Behavior During Therapy: Impulsive Overall Cognitive Status: Impaired/Different from baseline Area of Impairment: Awareness;Safety/judgement                         Safety/Judgement: Decreased awareness of safety;Decreased awareness of deficits Awareness: Emergent   General Comments: pt reports driving in August [id number] when directly asked why patient is attempting to drive with visual deficits pt states "well i worked on cars for 40 years. its hard to just give that up" pt does endorse having family that can assist with any transportation needs. pt  lacks awareness to visual deficits and balance deficits. pt states "i havent fallen"        Exercises     Shoulder Instructions       General Comments VSS    Pertinent Vitals/ Pain          Home Living                                          Prior Functioning/Environment              Frequency  Min 2X/week        Progress Toward Goals  OT Goals(current goals can now be found in the care plan section)  Progress towards OT goals: Progressing toward goals  Acute Rehab OT Goals Patient Stated Goal: to go home today OT Goal Formulation: With patient Time For Goal Achievement: 09/01/21 Potential to Achieve Goals: Good ADL Goals Pt Will Perform Grooming: with min guard assist;standing Pt Will Perform Lower Body Bathing: with min guard assist;sit to/from stand Pt Will Perform Lower Body Dressing: with min guard assist;sit to/from stand Pt Will Transfer to Toilet: with min guard assist;ambulating;regular height toilet;bedside commode;grab bars Pt Will Perform Toileting - Clothing Manipulation and hygiene: with min guard assist;sit to/from stand  Plan Discharge plan needs to be updated    Co-evaluation                 AM-PAC OT "6 Clicks" Daily Activity     Outcome Measure   Help from another person eating meals?: None Help from another person taking care of personal grooming?: A Little Help from another person toileting, which includes using toliet, bedpan, or urinal?: A Little Help from another person bathing (including washing, rinsing, drying)?: A Little Help from another person to put on and taking off regular upper body clothing?: A Little Help from another person to put on and taking off regular lower body clothing?: A Little 6 Click Score: 19    End of Session Equipment Utilized During Treatment: Gait belt;Rolling walker  OT Visit Diagnosis: Unsteadiness on feet (R26.81);Other abnormalities of gait and mobility (R26.89);Other  symptoms and signs involving cognitive function   Activity Tolerance Patient tolerated treatment well   Patient Left with call bell/phone within reach;in bed;with bed alarm set   Nurse Communication Mobility status;Precautions        Time: 1500-1530 OT Time Calculation (min): 30 min  Charges: OT General Charges $OT Visit: 1 Visit OT Treatments $Therapeutic Activity: 23-37 mins   Brynn, OTR/L  Acute Rehabilitation Services Pager: [phone] Office: [phone] .  Jeri Modena 08/22/2021, 3:55 PM

## 2021-08-22 NOTE — Discharge Instructions (Signed)
Foley Catheter Care, Adult A soft, flexible tube (Foley catheter) has been placed in your bladder. This may be done to temporarily help with urine drainage after an operation or to relieve blockage from an enlarged prostate gland. HOME CARE INSTRUCTIONS  If you are going home with a Foley catheter in place, follow these instructions: Taking Care of the Catheter: Keep the area where the catheter leaves your body clean. Attach the catheter to the leg so there is no tension on the catheter. Keep the drainage bag below the level of the bladder, but keep it OFF the floor. Do not take long soaking baths.  Wash your hands before touching ANYTHING related to the catheter or bag. Using mild soap and warm water on a washcloth: Clean the area closest to the catheter insertion site using a circular motion around the catheter. Clean the catheter itself by wiping AWAY from the insertion site for several inches down the tube. NEVER wipe upward as this could sweep bacteria up into the urethra (tube in your body that normally drains the bladder) and cause infection. Taking Care of the Drainage Bags: Two drainage bags will be taken home: a large overnight drainage bag, and a smaller leg bag which fits underneath clothing. It is okay to wear the overnight bag at any time, but NEVER wear the smaller leg bag at night. Keep the drainage bag well below the level of your bladder. This prevents backflow of urine into the bladder and allows the urine to drain freely. Anchor the tubing to your leg to prevent pulling or tension on the catheter. Use tape or a leg strap provided by the hospital. Empty the drainage bag when it is  to  full. Wash your hands before and after touching the bag. Periodically check the tubing for kinks to make sure there is no pressure on the tubing which could restrict the flow of urine. Changing the Drainage Bags: Cleanse both ends of the clean bag with alcohol before changing. Pinch off the  rubber catheter to avoid urine spillage during the disconnection. Disconnect the dirty bag and connect the clean one. Empty the dirty bag carefully to avoid a urine spill. Attach the new bag to the leg with tape or a leg strap. Cleaning the Drainage Bags: Whenever a drainage bag is disconnected, it must be cleaned quickly so it is ready for the next use. Wash the bag in warm, soapy water. Rinse the bag thoroughly with warm water. Soak the bag for 30 minutes in a solution of white vinegar and water (1 cup vinegar to 1 quart warm water). Rinse with warm water. SEEK MEDICAL CARE IF:  Some pain develops in the kidney (lower back) area. The urine is cloudy or smells bad. There is some blood in the urine. The catheter becomes clogged and/or there is no urine drainage. SEEK IMMEDIATE MEDICAL CARE IF:  You have moderate or severe pain in the kidney region. You start to throw up (vomit). Blood fills the tube. Worsening belly (abdominal) pain develops. You have a fever. MAKE SURE YOU:  Understand these instructions. Will watch your condition. Will get help right away if you are not doing well or get worse.  Call [facility] if you have any questions or concerns: [phone]    

## 2021-08-23 ENCOUNTER — Ambulatory Visit: Payer: Self-pay | Admitting: Family Medicine

## 2021-08-23 ENCOUNTER — Other Ambulatory Visit (HOSPITAL_COMMUNITY): Payer: Self-pay

## 2021-08-23 LAB — PHOSPHORUS: Phosphorus: 2.9 mg/dL (ref 2.5–4.6)

## 2021-08-23 LAB — CBC WITH DIFFERENTIAL/PLATELET
Abs Immature Granulocytes: 0.07 10*3/uL (ref 0.00–0.07)
Basophils Absolute: 0.1 10*3/uL (ref 0.0–0.1)
Basophils Relative: 1 %
Eosinophils Absolute: 0.4 10*3/uL (ref 0.0–0.5)
Eosinophils Relative: 4 %
HCT: 37 % — ABNORMAL LOW (ref 39.0–52.0)
Hemoglobin: 12 g/dL — ABNORMAL LOW (ref 13.0–17.0)
Immature Granulocytes: 1 %
Lymphocytes Relative: 23 %
Lymphs Abs: 1.9 10*3/uL (ref 0.7–4.0)
MCH: 29.4 pg (ref 26.0–34.0)
MCHC: 32.4 g/dL (ref 30.0–36.0)
MCV: 90.7 fL (ref 80.0–100.0)
Monocytes Absolute: 0.8 10*3/uL (ref 0.1–1.0)
Monocytes Relative: 10 %
Neutro Abs: 4.9 10*3/uL (ref 1.7–7.7)
Neutrophils Relative %: 61 %
Platelets: 175 10*3/uL (ref 150–400)
RBC: 4.08 MIL/uL — ABNORMAL LOW (ref 4.22–5.81)
RDW: 13.8 % (ref 11.5–15.5)
WBC: 8.1 10*3/uL (ref 4.0–10.5)
nRBC: 0 % (ref 0.0–0.2)

## 2021-08-23 LAB — COMPREHENSIVE METABOLIC PANEL
ALT: 36 U/L (ref 0–44)
AST: 35 U/L (ref 15–41)
Albumin: 2.6 g/dL — ABNORMAL LOW (ref 3.5–5.0)
Alkaline Phosphatase: 49 U/L (ref 38–126)
Anion gap: 8 (ref 5–15)
BUN: 13 mg/dL (ref 8–23)
CO2: 26 mmol/L (ref 22–32)
Calcium: 9 mg/dL (ref 8.9–10.3)
Chloride: 98 mmol/L (ref 98–111)
Creatinine, Ser: 0.61 mg/dL (ref 0.61–1.24)
GFR, Estimated: >60 mL/min (ref >60)
Glucose, Bld: 93 mg/dL (ref 70–99)
Potassium: 3.8 mmol/L (ref 3.5–5.1)
Sodium: 132 mmol/L — ABNORMAL LOW (ref 135–145)
Total Bilirubin: 0.5 mg/dL (ref 0.3–1.2)
Total Protein: 5.8 g/dL — ABNORMAL LOW (ref 6.5–8.1)

## 2021-08-23 LAB — MAGNESIUM: Magnesium: 2.1 mg/dL (ref 1.7–2.4)

## 2021-08-23 MED ORDER — TAMSULOSIN HCL 0.4 MG PO CAPS
0.4000 mg | ORAL_CAPSULE | Freq: Every day | ORAL | 0 refills | Status: DC
  Filled 2021-08-23: qty 30, 30d supply, fill #0

## 2021-08-23 MED ORDER — BETHANECHOL CHLORIDE 10 MG PO TABS
10.0000 mg | ORAL_TABLET | Freq: Three times a day (TID) | ORAL | 0 refills | Status: AC
  Filled 2021-08-23: qty 90, 30d supply, fill #0

## 2021-08-23 MED ORDER — METOPROLOL TARTRATE 25 MG PO TABS
12.5000 mg | ORAL_TABLET | Freq: Two times a day (BID) | ORAL | 0 refills | Status: DC
Start: 2021-08-23 — End: 2021-09-21
  Filled 2021-08-23: qty 30, 30d supply, fill #0

## 2021-08-23 MED ORDER — APIXABAN 5 MG PO TABS
5.0000 mg | ORAL_TABLET | Freq: Two times a day (BID) | ORAL | 0 refills | Status: DC
  Filled 2021-08-23: qty 60, 30d supply, fill #0

## 2021-08-23 MED ORDER — LEVETIRACETAM 1000 MG PO TABS
1000.0000 mg | ORAL_TABLET | Freq: Two times a day (BID) | ORAL | 0 refills | Status: DC
  Filled 2021-08-23: qty 60, 30d supply, fill #0

## 2021-08-23 NOTE — Discharge Summary (Signed)
Physician Discharge Summary  Travis Salazar. [id number] DOB: [birth year] DOA: 08/13/2021  PCP: [name], MD  Admit date: 08/13/2021 Discharge date: 08/23/2021  Time spent: 40 minutes  Recommendations for Outpatient Follow-up:  Follow outpatient CBC/CMP Follow with neurology outpatient for seizures Follow with ophthalmology outpatient for vision issues No driving for 6 months - given visual deficits, I do not think it's appropriate that he drives - discussed with pt and he expresses understanding Discharged on keppra 1000 mg BID Follow with PCP for right upper lobe mass Follow with urology for acute urinary retention  ESBL UTI - asymptomatic, follow if symptoms, consider treatment   Discharge Diagnoses:  Principal Problem:   Status epilepticus ([place]) Active Problems:   Essential (primary) hypertension   Pulmonary mass   Acute urinary retention   Discharge Condition: stable  Diet recommendation: heart healthy  Filed Weights   08/19/21 0500 08/22/21 0518 08/23/21 0456  Weight: 89.5 kg 87.7 kg 86.7 kg    History of present illness:  This 85 y.o. male with PMH significant of CVA, PE on Eliquis, hypertension presented with c/o: Left-sided weakness concerning for CVA but was found to have evidence of seizures and was in status epilepticus.  Patient was intubated secondary to inability to protect his airway.  Patient received Precedex, Keppra, Dilantin to control seizures.  Neurology was consulted and patient underwent LTM EEG.  Patient extubated successfully and  now seizures are controlled on Keppra.  He's currently stable for discharge with plan for outpatient follow up.  See below for additional details  Hospital Course:  Status epilepticus:  Present on admission, Neurology consulted.  Patient was intubated for inability to protect airway and respiratory failure and was admitted in ICU.  Patient was managed with Precedex, Keppra 40 mg/kg load and dilantin.  LTM  EEG significant for epileptogenicity arising from right hemisphere. Seizure likely secondary to prior stroke history.  Dilantin discontinued secondary to drug interaction with Eliquis. Neurology signed off. Continue Keppra 1000 mg BID; outpatient neurology follow-up. 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 Secondary to seizures/status epilepticus.   Acute hypoxic respiratory failure: > Resolved. Patient required intubation on 8/29.   Successfully extubated on 8/31.  Now on room air.   History of pulmonary embolism Continue Eliquis   Right upper lobe mass: Concern for cancer but patient has decided on not pursue diagnosis. Follow-up as an outpatient - discussed with daughter.   Essential hypertension Continue amlodipine, HCTZ.  Started on metop.  Hydralazine d/c'd.   Acute urinary retention : Foley catheter placed on 8/31 Patient has been having issues with voiding.  Continue Flomax, urocholine. Urology consulted, plan for foley 7-10 days, flomax 0.4 mg daily - needs follow up with urology outpatient for catheter removal and TOV   ESBL UTI: Urine culture significant for ESBL (40k colonies) Ceftriaxone changed to meropenem. Discussed with ID, recommended since patient has no symptoms , No need for antibiotics. Patient has received 3 days of IV antibiotics that should be sufficient.  Antibiotic discontinued.   Visual Deficits OT noted significant LUQ deficits - will repeat MRI - without significant change Needs outpatient ophtho follow up Pt reports hx glaucoma - he hasn't followed up with ophtho since he was told he may need intraocular injections  Procedures: EEG ABNORMALITY - Lateralized periodic discharges, right hemisphere, maximal right  temporal region - Continuous slow, generalized    IMPRESSION: This study  initially showed evidence of epileptogenicity and cortical dysfunction arising from right hemisphere, maximal right temporal region likely secondary to underlying structural abnormality, seizure.  There was also moderate to severe diffuse encephalopathy, nonspecific etiology but likely related to sedation. No seizures were seen throughout the recording.   ABNORMALITY - Lateralized periodic discharges, right hemisphere, maximal right temporal region - Continuous slow, generalized and lateralized right hemisphere   IMPRESSION: This study howed evidence of epileptogenicity and cortical dysfunction arising from right hemisphere, maximal right temporal region likely secondary to underlying structural abnormality, seizure. Of note, lateralized periodic discharges are on the ictal-interictal continuum with increased potential for seizure recurrence. There is also moderate to severe diffuse encephalopathy, nonspecific etiology but likely related to seizure. No seizures were seen throughout the recording.   Salazar Havens    Consultations: Neurology PCCM   Discharge Exam: Vitals:   08/23/21 0814 08/23/21 1111  BP: 127/65 129/71  Pulse: 76 77  Resp: 16 16  Temp: 97.7 F (36.5 C) 98.2 F (36.8 C)  SpO2: 95% 95%   Ready for discharge No new complaints  General: No acute distress. Cardiovascular: RRR Lungs: unlabored Abdomen: Soft, nontender, nondistended  Neurological: Alert and oriented 3. Moves all extremities 4. Cranial nerves II through XII grossly intact. Skin: Warm and dry. No rashes or lesions. Extremities: No clubbing or cyanosis. No edema.  Discharge Instructions   Discharge Instructions     Ambulatory referral to Neurology   Complete by: As directed    An appointment is requested in approximately: 2 weeks   Call MD for:  difficulty breathing, headache or visual disturbances   Complete by: As directed    Call MD for:  extreme fatigue   Complete by: As directed     Call MD for:  hives   Complete by: As directed    Call MD for:  persistant dizziness or light-headedness   Complete by: As directed    Call MD for:  persistant nausea and vomiting   Complete by: As directed    Call MD for:  redness, tenderness, or signs of infection (pain, swelling, redness, odor or green/yellow discharge around incision site)   Complete by: As directed    Call MD for:  severe uncontrolled pain   Complete by: As directed    Call MD for:  temperature >100.4   Complete by: As directed    Diet - low sodium heart healthy   Complete by: As directed    Discharge instructions   Complete by: As directed    You were seen with seizures.   These have improved on your keppra.  You should not drive.  Follow the seizure precautions below.  Even after 6 months, you should not drive due to your visual deficits.  Follow up with neurology as an outpatient.  You had urinary retention and will discharge with Jet Traynham foley.  You should [facility] with [facility] within the next week or so for Jalon Squier trial of void (foley removal).  We stopped your hydralazine and started you on metoprolol.    Follow up with your PCP with the right upper lobe mass concerning for cancer.  Follow up with your eye doctor for your visual deficits.  Return for new, recurrent, or worsening symptoms.  Please ask your PCP to request records from this hospitalization so they know what was done and what the next steps will be.  Per [employer]  [place] DMV statutes, patients with seizures are not allowed to drive until  they have been seizure-free for six months. Use caution when using heavy equipment or power tools. Avoid working on ladders or at heights. Take showers instead of baths. Ensure the water temperature is not too high on the home water heater. Do not go swimming alone. When caring for infants or small children, sit down when holding, feeding, or changing them to minimize risk of injury to the child in the event you  have Niala Stcharles seizure. Also, Maintain good sleep hygiene. Avoid alcohol.   Increase activity slowly   Complete by: As directed       Allergies as of 08/23/2021   No Known Allergies      Medication List     STOP taking these medications    Eliquis DVT/PE Starter Pack Generic drug: Apixaban Starter Pack ('10mg'$  and '5mg'$ ) Replaced by: Eliquis 5 MG Tabs tablet   hydrALAZINE 100 MG tablet Commonly known as: APRESOLINE       TAKE these medications    amLODipine 10 MG tablet Commonly known as: NORVASC TAKE 1 TABLET BY MOUTH EVERY DAY   atorvastatin 40 MG tablet Commonly known as: LIPITOR TAKE 1 TABLET (40 MG TOTAL) BY MOUTH DAILY AT 6 PM.   bethanechol 10 MG tablet Commonly known as: URECHOLINE Take 1 tablet (10 mg total) by mouth 3 (three) times daily.   colchicine 0.6 MG tablet Take 1 tablet (0.6 mg total) by mouth as needed (gout).   Eliquis 5 MG Tabs tablet Generic drug: apixaban Take 1 tablet (5 mg total) by mouth 2 (two) times daily. Replaces: Eliquis DVT/PE Starter Pack   levETIRAcetam 1000 MG tablet Commonly known as: KEPPRA Take 1 tablet (1,000 mg total) by mouth 2 (two) times daily.   metoprolol tartrate 25 MG tablet Commonly known as: LOPRESSOR Take one-half tablet (12.5 mg total) by mouth 2 (two) times daily.   tamsulosin 0.4 MG Caps capsule Commonly known as: FLOMAX Take 1 capsule (0.4 mg total) by mouth daily. Start taking on: August 24, 2021               Durable Medical Equipment  (From admission, onward)           Start     Ordered   08/19/21 1433  For home use only DME Walker rolling  Once       Question Answer Comment  Walker: With 5 Inch Wheels   Patient needs Akhil Piscopo walker to treat with the following condition Decreased functional mobility and endurance      08/19/21 1432           No Known Allergies  Follow-up Information     [facility] [place] Follow up.   Specialty: Home Health Services Why: HHPT/OT arranged- the  office will call to schedule home health visits. please allow 48 hours after discharge for follow up. Contact information: [place] [address] [phone]         [facility]. Call in 1 week(s).   Why: Catheter removal Contact information: [place]        [name], MD Follow up.   Specialty: Family Medicine Contact information: [address] [place] [address] [phone]         Jerline Pain, MD .   Specialty: Cardiology Contact information: [phone]  [place] [address] [phone]                  The results of significant diagnostics from this hospitalization (including imaging, microbiology, ancillary and laboratory) are listed below for reference.    Significant Diagnostic Studies: MR BRAIN WO CONTRAST  Result Date: 08/22/2021 CLINICAL DATA:  Neuro deficit, acute, stroke suspected visual deficits EXAM: MRI HEAD WITHOUT CONTRAST TECHNIQUE: Multiplanar, multiecho pulse sequences of the brain and surrounding structures were obtained without intravenous contrast. COMPARISON:  08/14/2021 FINDINGS: Brain: There is no acute infarction or intracranial hemorrhage. There is no intracranial mass, mass effect, or edema. There is no hydrocephalus or extra-axial fluid collection. Chronic right MCA territorial infarction is identified with associated volume loss and chronic blood products. Prominence of the ventricles and sulci reflects superimposed parenchymal volume loss. Vascular: Major vessel flow voids at the skull base are preserved. Skull and upper cervical spine: Normal marrow signal is preserved. Sinuses/Orbits: Mild mucosal thickening.  Orbits are unremarkable. Other: Sella is unremarkable.  Mastoid air cells are clear. IMPRESSION: No acute infarction or significant change since 08/14/2021. Electronically  Signed   By: Macy Mis M.D.   On: 08/22/2021 20:49   MR BRAIN WO CONTRAST  Result Date: 08/14/2021 CLINICAL DATA:  Seizure EXAM: MRI HEAD WITHOUT CONTRAST TECHNIQUE: Multiplanar, multiecho pulse sequences of the brain and surrounding structures were obtained without intravenous contrast. COMPARISON:  None. FINDINGS: Brain: No acute infarct, mass effect or extra-axial collection. Right MCA territory encephalomalacia with chronic blood products. Normal white matter signal, parenchymal volume and CSF spaces. The midline structures are normal. Vascular: Major flow voids are preserved. Skull and upper cervical spine: Normal calvarium and skull base. Visualized upper cervical spine and soft tissues are normal. Sinuses/Orbits:No paranasal sinus fluid levels or advanced mucosal thickening. No mastoid or middle ear effusion. Normal orbits. IMPRESSION: 1. No acute intracranial abnormality. 2. Right MCA territory encephalomalacia with chronic blood products. Electronically Signed   By: Ulyses Jarred M.D.   On: 08/14/2021 03:26   DG CHEST PORT 1 VIEW  Result Date: 08/13/2021 CLINICAL DATA:  Endotracheal tube placement EXAM: PORTABLE CHEST 1 VIEW COMPARISON:  08/13/2021 FINDINGS: Tip of the endotracheal tube is at the level of the clavicular heads. Esophageal catheter is coiled within the stomach. Bibasilar opacities, likely mild interstitial pulmonary edema. Poorly visualized right upper lobe mass. IMPRESSION: Tip of endotracheal tube at the level of the clavicular heads. Esophageal catheter coiled within the stomach. Electronically Signed   By: Ulyses Jarred M.D.   On: 08/13/2021 19:37   DG CHEST PORT 1 VIEW  Result Date: 08/13/2021 CLINICAL DATA:  Acute respiratory failure with hypoxia. Code stroke. EXAM: PORTABLE CHEST 1 VIEW COMPARISON:  Chest x-ray 05/27/2021, CT chest 05/27/2021 FINDINGS: The heart and mediastinal contours are unchanged. Aortic calcification. Persistent elevation of left hemidiaphragm.  Bibasilar streaky airspace opacities likely represent atelectasis. Redemonstration of Sharlot Sturkey known right upper lobe pulmonary mass (measuring at least 2.6 cm) that is poorly evaluated on this radiograph. No focal consolidation. No pulmonary edema. No pleural effusion. No pneumothorax. No acute osseous abnormality. IMPRESSION: 1. No acute cardiopulmonary abnormality in Shigeru Lampert patient with known right upper lobe pulmonary mass. As mentioned on CT angiography 05/27/2021: Additional imaging evaluation or consultation with Pulmonology or Thoracic Surgery recommended if not already obtained. 2.  Aortic Atherosclerosis (ICD10-I70.0). Electronically Signed   By: Iven Finn M.D.   On: 08/13/2021 18:34   EEG adult  Result Date: 08/13/2021 Salazar Havens, MD  08/13/2021  8:28 PM Patient Name: Travis Salazar. MRN: [id number] Epilepsy Attending: [name] Referring Physician/Provider: Clance Boll, NP Date: 08/13/2021 Duration: 23.15 mins Patient history: 85 yo male who arrived as Qiana Landgrebe code stroke but was actively seizing. EEG to evaluate for status epilepticus. Level of alertness:  lethargic AEDs during EEG study: Ativan Technical aspects: This EEG study was done with scalp electrodes positioned according to the 10-20 International system of electrode placement. Electrical activity was acquired at [place]ope Emmitt sampling rate of '500Hz'$  and reviewed with Janzen Sacks high frequency filter of '70Hz'$  and Ghassan Coggeshall low frequency filter of '1Hz'$ . EEG data were recorded continuously and digitally stored. Description: EEG showed continuous generalized and lateralized right hemisphere 3 to 6 Hz theta-delta slowing. Admixed with 15-'18hz'$  generalized beta activity.Lateralized periodic discharges were noted in right hemisphere, maximal right temporal region with fluctuating frequency between 1-1.'5hz'$ .  Hyperventilation and photic stimulation were not performed.   ABNORMALITY - Lateralized periodic discharges, right hemisphere, maximal right temporal region -  Continuous slow, generalized and lateralized right hemisphere IMPRESSION: This study howed evidence of epileptogenicity and cortical dysfunction arising from right hemisphere, maximal right temporal region likely secondary to underlying structural abnormality, seizure. Of note, lateralized periodic discharges are on the ictal-interictal continuum with increased potential for seizure recurrence. There is also moderate to severe diffuse encephalopathy, nonspecific etiology but likely related to seizure. No seizures were seen throughout the recording. Priyanka Barbra Sarks   Overnight EEG with video  Result Date: 08/14/2021 Salazar Havens, MD     08/15/2021  8:53 AM Patient Name: Travis Salazar. MRN: [id number] Epilepsy Attending: [name] Referring Physician/Provider: Clance Boll, NP Duration: 08/13/2021 1750 to 08/14/2021 1750  Patient history: 85 yo male who arrived as Kyden Potash code stroke but was actively seizing. EEG to evaluate for status epilepticus.  Level of alertness:  lethargic  AEDs during EEG study: PHT, Propofol  Technical aspects: This EEG study was done with scalp electrodes positioned according to the 10-20 International system of electrode placement. Electrical activity was acquired at Kalup Jaquith sampling rate of '500Hz'$  and reviewed with Indiah Heyden high frequency filter of '70Hz'$  and Avaree Gilberti low frequency filter of '1Hz'$ . EEG data were recorded continuously and digitally stored.  Description: EEG showed continuous generalized 3 to 6 Hz theta-delta slowing admixed with 15-'18hz'$  generalized beta activity. At the beginning of study, lateralized periodic discharges (LPDs) were noted in right hemisphere, maximal right temporal region with fluctuating frequency between 1-1.'5hz'$ . Propofol was started at around 1820 on 08/13/2021 after which LPDs were not seen.  On 08/14/2021 at 0900 propofol was stopped after which EEG again showed right LPDs at '1Hz'$ . Hyperventilation and photic stimulation were not performed.    ABNORMALITY -  Lateralized periodic discharges, right hemisphere, maximal right temporal region - Continuous slow, generalized IMPRESSION: This study initially showed evidence of epileptogenicity and cortical dysfunction arising from right hemisphere, maximal right temporal region likely secondary to underlying structural abnormality, seizure.  There was also moderate to severe diffuse encephalopathy, nonspecific etiology but likely related to sedation. No seizures were seen throughout the recording.  Salazar Havens   CT HEAD CODE STROKE WO CONTRAST  Result Date: 08/13/2021 CLINICAL DATA:  Code stroke.  Neuro deficit, acute, stroke suspected EXAM: CT HEAD WITHOUT CONTRAST TECHNIQUE: Contiguous axial images were obtained from the base of the skull through the vertex without intravenous contrast. COMPARISON:  CT head May 27, 2021 FINDINGS: Brain: No evidence of acute large vascular territory infarction, hemorrhage, hydrocephalus, extra-axial collection or mass lesion/mass  effect. Similar remote right MCA territory infarcts with encephalomalacia. Mild atrophy. Vascular: No hyperdense vessel identified. Calcific intracranial atherosclerosis. Skull: No acute fracture. Sinuses/Orbits: Right maxillary sinus retention cyst. Mild paranasal sinus mucosal thickening. No acute orbital findings. Other: No mastoid effusions. ASPECTS [facility] Stroke Program Early CT Score) Total score (0-10 with 10 being normal): 10. IMPRESSION: 1. No evidence of acute large vascular territory infarct or acute hemorrhage. ASPECTS is 10. 2. Large remote right MCA territory infarcts. Code stroke imaging results were communicated on 08/13/2021 at 4:31 pm to provider Dr. Quinn Axe Via secure text paging. Electronically Signed   By: Margaretha Sheffield M.D.   On: 08/13/2021 16:33    Microbiology: Recent Results (from the past 240 hour(s))  MRSA Next Gen by PCR, Nasal     Status: None   Collection Time: 08/13/21 10:19 PM   Specimen: Nasal Mucosa; Nasal Swab   Result Value Ref Range Status   MRSA by PCR Next Gen NOT DETECTED NOT DETECTED Final    Comment: (NOTE) The GeneXpert MRSA Assay (FDA approved for NASAL specimens only), is one component of Orris Perin comprehensive MRSA colonization surveillance program. It is not intended to diagnose MRSA infection nor to guide or monitor treatment for MRSA infections. Test performance is not FDA approved in patients less than 33 years old. Performed at [facility], [place] [address], [place], [place] [address]   Urine Culture     Status: Abnormal   Collection Time: 08/18/21  9:57 AM   Specimen: Urine, Clean Catch  Result Value Ref Range Status   Specimen Description URINE, CLEAN CATCH  Final   Special Requests   Final    NONE Performed at [facility], [place] [address] [place], [place] [address]    Culture (Kevaughn Ewing)  Final    40,000 COLONIES/mL ESCHERICHIA COLI Confirmed Extended Spectrum Beta-Lactamase Producer (ESBL).  In bloodstream infections from ESBL organisms, carbapenems are preferred over piperacillin/tazobactam. They are shown to have Delynda Sepulveda lower risk of mortality.    Report Status 08/20/2021 FINAL  Final   Organism ID, Bacteria ESCHERICHIA COLI (Litisha Guagliardo)  Final      Susceptibility   Escherichia coli - MIC*    AMPICILLIN >=32 RESISTANT Resistant     CEFAZOLIN >=64 RESISTANT Resistant     CEFEPIME 16 RESISTANT Resistant     CEFTRIAXONE >=64 RESISTANT Resistant     CIPROFLOXACIN >=4 RESISTANT Resistant     GENTAMICIN <=1 SENSITIVE Sensitive     IMIPENEM <=0.25 SENSITIVE Sensitive     NITROFURANTOIN 128 RESISTANT Resistant     TRIMETH/SULFA >=320 RESISTANT Resistant     AMPICILLIN/SULBACTAM 16 INTERMEDIATE Intermediate     PIP/TAZO <=4 SENSITIVE Sensitive     * 40,000 COLONIES/mL ESCHERICHIA COLI     Labs: Basic Metabolic Panel: Recent Labs  Lab 08/22/21 0829 08/23/21 0407  NA 133* 132*  K 3.7 3.8  CL 99 98  CO2 27 26  GLUCOSE 90 93  BUN 16 13  CREATININE 0.67 0.61  CALCIUM 8.7* 9.0   MG  --  2.1  PHOS  --  2.9   Liver Function Tests: Recent Labs  Lab 08/22/21 0829 08/23/21 0407  AST 38 35  ALT 36 36  ALKPHOS 47 49  BILITOT 0.7 0.5  PROT 5.7* 5.8*  ALBUMIN 2.6* 2.6*   No results for input(s): LIPASE, AMYLASE in the last 168 hours. No results for input(s): AMMONIA in the last 168 hours. CBC: Recent Labs  Lab 08/22/21 0829 08/23/21 0407  WBC 7.7 8.1  NEUTROABS 4.8 4.9  HGB 12.2* 12.0*  HCT 37.6* 37.0*  MCV 91.5 90.7  PLT 157 175   Cardiac Enzymes: No results for input(s): CKTOTAL, CKMB, CKMBINDEX, TROPONINI in the last 168 hours. BNP: BNP (last 3 results) No results for input(s): BNP in the last 8760 hours.  ProBNP (last 3 results) No results for input(s): PROBNP in the last 8760 hours.  CBG: No results for input(s): GLUCAP in the last 168 hours.     Signed:  Fayrene Helper MD.  [facility] 08/23/2021, 7:46 PM

## 2021-08-23 NOTE — TOC Transition Note (Signed)
Transition of Care (TOC) - CM/SW Discharge Note Marvetta Gibbons RN,BSN Transitions of Care Unit 4NP (non trauma) - RN Case Manager See Treatment Team for direct Phone #    Patient Details  Name: Travis Salazar. MRN: [id number] Date of Birth: May 10, 1936  Transition of Care [facility]) CM/SW Contact:  Dawayne Patricia, RN Phone Number: 08/23/2021, 11:28 AM   Clinical Narrative:    Pt stable for transition home, [facility] services have been arranged with Alvis Lemmings- spoke with Tommi Rumps this am to update on needed services. Pt to return home Foley in place 7-10 days and f/u with Urology.   DME- RW has been delivered to the room for home.   Family to transport home.    Final next level of care: [place] Barriers to Discharge: Continued Medical Work up   Patient Goals and CMS Choice Patient states their goals for this hospitalization and ongoing recovery are:: return home with family and [facility] PT/OT CMS Medicare.gov Compare Post Acute Care list provided to:: Patient Choice offered to / list presented to : Patient  Discharge Placement                 Home w/ [facility]      Discharge Plan and Services In-house Referral: Clinical Social Work Discharge Planning Services: CM Consult Post Acute Care Choice: Home Health, Durable Medical Equipment          DME Arranged: [place] rolling DME Agency: [employer] Date DME Agency Contacted: 08/19/21 Time DME Agency Contacted: 1500 Representative spoke with at DME Agency: [place]: Refused SNF [place] Agency: [place] Date [place]: 08/19/21 Time [place]: 1500 Representative spoke with at [place]: [place] Determinants of Health ([place]) Interventions     Readmission Risk Interventions Readmission Risk Prevention Plan 08/23/2021 08/22/2021  Post Dischage Appt Complete -  Medication Screening Complete -  Transportation Screening Complete Complete  PCP or Specialist Appt within 5-7 Days -  Complete  Home Care Screening - Complete  Medication Review (RN CM) - Complete  Some recent data might be hidden

## 2021-08-23 NOTE — Consult Note (Signed)
   [facility] CM Inpatient Consult   08/23/2021  Shown [place]. 03/14/1936 [id number]  [place] Organization [ACO] Patient [employer] Medicare  Primary Care Provider:  [name], MD, [facility] practice is an Embedded provider with a chronic care management program.  Review from community Chronic Care Management notes in encounter.  Patient has agreed to [facility] services.  Patient will be followed in the CCM program.  No further community needs assessed.  Natividad Brood, RN BSN [facility] Liaison  [phone] business mobile phone Toll free office [phone]  Fax number: [phone] [email or web address]

## 2021-08-24 ENCOUNTER — Telehealth: Payer: Self-pay | Admitting: Cardiology

## 2021-08-24 NOTE — Telephone Encounter (Signed)
   Tammy pt's daughter calling, pt needs hospital f/u with Dr, Marlou Porch, she said pt's needs to get an appt before January

## 2021-08-24 NOTE — Telephone Encounter (Signed)
Left a message for pt daughter to call back.  I need to inform her that her name is not on pt DPR.

## 2021-08-24 NOTE — Telephone Encounter (Signed)
Daughter Tammy called in I informed her that her name is not on pt DPR.  Pt beside daughter he gave verbal consent for me to speak with daughter.  I informed daughter that I will route her request to MD nurse to address upon her return.  MD has no available appointments until January.  I have placed pt on MD wait list.

## 2021-08-25 ENCOUNTER — Emergency Department (HOSPITAL_COMMUNITY): Payer: Medicare Other

## 2021-08-25 ENCOUNTER — Inpatient Hospital Stay (HOSPITAL_COMMUNITY)
Admission: EM | Admit: 2021-08-25 | Discharge: 2021-08-30 | DRG: 301 | Disposition: A | Discharge status: Home Health | Payer: Medicare Other | Attending: Family Medicine | Admitting: Family Medicine

## 2021-08-25 ENCOUNTER — Other Ambulatory Visit: Payer: Self-pay

## 2021-08-25 ENCOUNTER — Encounter (HOSPITAL_COMMUNITY): Payer: Self-pay | Admitting: Emergency Medicine

## 2021-08-25 DIAGNOSIS — G40909 Epilepsy, unspecified, not intractable, without status epilepticus: Secondary | ICD-10-CM | POA: Diagnosis not present

## 2021-08-25 DIAGNOSIS — Z825 Family history of asthma and other chronic lower respiratory diseases: Secondary | ICD-10-CM | POA: Diagnosis not present

## 2021-08-25 DIAGNOSIS — I10 Essential (primary) hypertension: Secondary | ICD-10-CM | POA: Diagnosis present

## 2021-08-25 DIAGNOSIS — Z79899 Other long term (current) drug therapy: Secondary | ICD-10-CM

## 2021-08-25 DIAGNOSIS — R338 Other retention of urine: Secondary | ICD-10-CM | POA: Diagnosis present

## 2021-08-25 DIAGNOSIS — I824Y2 Acute embolism and thrombosis of unspecified deep veins of left proximal lower extremity: Secondary | ICD-10-CM | POA: Diagnosis not present

## 2021-08-25 DIAGNOSIS — R531 Weakness: Secondary | ICD-10-CM | POA: Diagnosis not present

## 2021-08-25 DIAGNOSIS — Z8249 Family history of ischemic heart disease and other diseases of the circulatory system: Secondary | ICD-10-CM | POA: Diagnosis not present

## 2021-08-25 DIAGNOSIS — I2510 Atherosclerotic heart disease of native coronary artery without angina pectoris: Secondary | ICD-10-CM | POA: Diagnosis present

## 2021-08-25 DIAGNOSIS — I82442 Acute embolism and thrombosis of left tibial vein: Secondary | ICD-10-CM | POA: Diagnosis present

## 2021-08-25 DIAGNOSIS — R918 Other nonspecific abnormal finding of lung field: Secondary | ICD-10-CM

## 2021-08-25 DIAGNOSIS — I82412 Acute embolism and thrombosis of left femoral vein: Principal | ICD-10-CM | POA: Diagnosis present

## 2021-08-25 DIAGNOSIS — Z20822 Contact with and (suspected) exposure to covid-19: Secondary | ICD-10-CM | POA: Diagnosis present

## 2021-08-25 DIAGNOSIS — Z86711 Personal history of pulmonary embolism: Secondary | ICD-10-CM | POA: Diagnosis not present

## 2021-08-25 DIAGNOSIS — R339 Retention of urine, unspecified: Secondary | ICD-10-CM | POA: Diagnosis present

## 2021-08-25 DIAGNOSIS — I82409 Acute embolism and thrombosis of unspecified deep veins of unspecified lower extremity: Secondary | ICD-10-CM | POA: Diagnosis not present

## 2021-08-25 DIAGNOSIS — Z7901 Long term (current) use of anticoagulants: Secondary | ICD-10-CM | POA: Diagnosis not present

## 2021-08-25 DIAGNOSIS — I700 Atherosclerosis of aorta: Secondary | ICD-10-CM | POA: Diagnosis not present

## 2021-08-25 DIAGNOSIS — Z8546 Personal history of malignant neoplasm of prostate: Secondary | ICD-10-CM | POA: Diagnosis not present

## 2021-08-25 DIAGNOSIS — I82432 Acute embolism and thrombosis of left popliteal vein: Secondary | ICD-10-CM | POA: Diagnosis present

## 2021-08-25 DIAGNOSIS — Z8673 Personal history of transient ischemic attack (TIA), and cerebral infarction without residual deficits: Secondary | ICD-10-CM

## 2021-08-25 DIAGNOSIS — E785 Hyperlipidemia, unspecified: Secondary | ICD-10-CM | POA: Diagnosis present

## 2021-08-25 DIAGNOSIS — Z9079 Acquired absence of other genital organ(s): Secondary | ICD-10-CM

## 2021-08-25 DIAGNOSIS — Z87891 Personal history of nicotine dependence: Secondary | ICD-10-CM | POA: Diagnosis not present

## 2021-08-25 DIAGNOSIS — Z87440 Personal history of urinary (tract) infections: Secondary | ICD-10-CM

## 2021-08-25 DIAGNOSIS — I82422 Acute embolism and thrombosis of left iliac vein: Secondary | ICD-10-CM | POA: Diagnosis present

## 2021-08-25 DIAGNOSIS — M7989 Other specified soft tissue disorders: Secondary | ICD-10-CM | POA: Diagnosis not present

## 2021-08-25 DIAGNOSIS — G40901 Epilepsy, unspecified, not intractable, with status epilepticus: Secondary | ICD-10-CM | POA: Diagnosis present

## 2021-08-25 DIAGNOSIS — M79605 Pain in left leg: Secondary | ICD-10-CM | POA: Diagnosis not present

## 2021-08-25 DIAGNOSIS — M47814 Spondylosis without myelopathy or radiculopathy, thoracic region: Secondary | ICD-10-CM | POA: Diagnosis not present

## 2021-08-25 LAB — RESP PANEL BY RT-PCR (FLU A&B, COVID) ARPGX2
Influenza A by PCR: NEGATIVE
Influenza B by PCR: NEGATIVE
SARS Coronavirus 2 by RT PCR: NEGATIVE

## 2021-08-25 LAB — COMPREHENSIVE METABOLIC PANEL
ALT: 38 U/L (ref 0–44)
AST: 40 U/L (ref 15–41)
Albumin: 2.9 g/dL — ABNORMAL LOW (ref 3.5–5.0)
Alkaline Phosphatase: 57 U/L (ref 38–126)
Anion gap: 9 (ref 5–15)
BUN: 13 mg/dL (ref 8–23)
CO2: 25 mmol/L (ref 22–32)
Calcium: 9.3 mg/dL (ref 8.9–10.3)
Chloride: 100 mmol/L (ref 98–111)
Creatinine, Ser: 0.77 mg/dL (ref 0.61–1.24)
GFR, Estimated: >60 mL/min (ref >60)
Glucose, Bld: 100 mg/dL — ABNORMAL HIGH (ref 70–99)
Potassium: 4 mmol/L (ref 3.5–5.1)
Sodium: 134 mmol/L — ABNORMAL LOW (ref 135–145)
Total Bilirubin: 1.1 mg/dL (ref 0.3–1.2)
Total Protein: 7 g/dL (ref 6.5–8.1)

## 2021-08-25 LAB — URINALYSIS, ROUTINE W REFLEX MICROSCOPIC
Glucose, UA: NEGATIVE mg/dL
Ketones, ur: 40 mg/dL — AB
Leukocytes,Ua: NEGATIVE
Nitrite: NEGATIVE
Protein, ur: NEGATIVE mg/dL
Specific Gravity, Urine: 1.02 (ref 1.005–1.030)
pH: 6 (ref 5.0–8.0)

## 2021-08-25 LAB — CBC WITH DIFFERENTIAL/PLATELET
Abs Immature Granulocytes: 0.11 10*3/uL — ABNORMAL HIGH (ref 0.00–0.07)
Basophils Absolute: 0.1 10*3/uL (ref 0.0–0.1)
Basophils Relative: 0 %
Eosinophils Absolute: 0.1 10*3/uL (ref 0.0–0.5)
Eosinophils Relative: 1 %
HCT: 39.9 % (ref 39.0–52.0)
Hemoglobin: 12.8 g/dL — ABNORMAL LOW (ref 13.0–17.0)
Immature Granulocytes: 1 %
Lymphocytes Relative: 12 %
Lymphs Abs: 1.7 10*3/uL (ref 0.7–4.0)
MCH: 29.7 pg (ref 26.0–34.0)
MCHC: 32.1 g/dL (ref 30.0–36.0)
MCV: 92.6 fL (ref 80.0–100.0)
Monocytes Absolute: 1.3 10*3/uL — ABNORMAL HIGH (ref 0.1–1.0)
Monocytes Relative: 9 %
Neutro Abs: 10.7 10*3/uL — ABNORMAL HIGH (ref 1.7–7.7)
Neutrophils Relative %: 77 %
Platelets: 286 10*3/uL (ref 150–400)
RBC: 4.31 MIL/uL (ref 4.22–5.81)
RDW: 13.5 % (ref 11.5–15.5)
WBC: 14 10*3/uL — ABNORMAL HIGH (ref 4.0–10.5)
nRBC: 0 % (ref 0.0–0.2)

## 2021-08-25 LAB — URINALYSIS, MICROSCOPIC (REFLEX)

## 2021-08-25 LAB — APTT: aPTT: 34 s (ref 24–36)

## 2021-08-25 LAB — HEPARIN LEVEL (UNFRACTIONATED): Heparin Unfractionated: >1.1 IU/mL — ABNORMAL HIGH (ref 0.30–0.70)

## 2021-08-25 LAB — PROTIME-INR
INR: 1.1 (ref 0.8–1.2)
Prothrombin Time: 14.6 s (ref 11.4–15.2)

## 2021-08-25 MED ORDER — ACETAMINOPHEN 325 MG PO TABS
650.0000 mg | ORAL_TABLET | Freq: Four times a day (QID) | ORAL | Status: DC | PRN
  Administered 2021-08-26: 650 mg via ORAL
  Filled 2021-08-25: qty 2

## 2021-08-25 MED ORDER — AMLODIPINE BESYLATE 10 MG PO TABS
10.0000 mg | ORAL_TABLET | Freq: Every day | ORAL | Status: DC
  Administered 2021-08-26 – 2021-08-30 (×5): 10 mg via ORAL
  Filled 2021-08-25 (×2): qty 1
  Filled 2021-08-25 (×2): qty 2
  Filled 2021-08-25 (×2): qty 1

## 2021-08-25 MED ORDER — TAMSULOSIN HCL 0.4 MG PO CAPS
0.4000 mg | ORAL_CAPSULE | Freq: Every day | ORAL | Status: DC
  Administered 2021-08-26 – 2021-08-30 (×5): 0.4 mg via ORAL
  Filled 2021-08-25 (×6): qty 1

## 2021-08-25 MED ORDER — SODIUM CHLORIDE 0.9 % IV SOLN: INTRAVENOUS | Status: DC

## 2021-08-25 MED ORDER — WARFARIN - PHARMACIST DOSING INPATIENT: Freq: Every day | Status: DC

## 2021-08-25 MED ORDER — ACETAMINOPHEN 650 MG RE SUPP: 650.0000 mg | Freq: Four times a day (QID) | RECTAL | Status: DC | PRN

## 2021-08-25 MED ORDER — ONDANSETRON HCL 4 MG PO TABS: 4.0000 mg | ORAL_TABLET | Freq: Four times a day (QID) | ORAL | Status: DC | PRN

## 2021-08-25 MED ORDER — LEVETIRACETAM 500 MG PO TABS
1000.0000 mg | ORAL_TABLET | Freq: Two times a day (BID) | ORAL | Status: DC
  Administered 2021-08-25 – 2021-08-30 (×10): 1000 mg via ORAL
  Filled 2021-08-25 (×10): qty 2

## 2021-08-25 MED ORDER — METOPROLOL TARTRATE 12.5 MG HALF TABLET
12.5000 mg | ORAL_TABLET | Freq: Two times a day (BID) | ORAL | Status: DC
  Administered 2021-08-25 – 2021-08-30 (×10): 12.5 mg via ORAL
  Filled 2021-08-25 (×10): qty 1

## 2021-08-25 MED ORDER — WARFARIN SODIUM 7.5 MG PO TABS
7.5000 mg | ORAL_TABLET | Freq: Once | ORAL | Status: AC
  Administered 2021-08-26: 7.5 mg via ORAL
  Filled 2021-08-25: qty 1

## 2021-08-25 MED ORDER — BETHANECHOL CHLORIDE 10 MG PO TABS
10.0000 mg | ORAL_TABLET | Freq: Three times a day (TID) | ORAL | Status: DC
  Administered 2021-08-25 – 2021-08-30 (×15): 10 mg via ORAL
  Filled 2021-08-25 (×17): qty 1

## 2021-08-25 MED ORDER — OXYCODONE HCL 5 MG PO TABS: 5.0000 mg | ORAL_TABLET | Freq: Four times a day (QID) | ORAL | Status: DC | PRN

## 2021-08-25 MED ORDER — HEPARIN (PORCINE) 25000 UT/250ML-% IV SOLN
1600.0000 [IU]/h | INTRAVENOUS | Status: DC
  Administered 2021-08-25 – 2021-08-26 (×2): 1600 [IU]/h via INTRAVENOUS
  Filled 2021-08-25 (×2): qty 250

## 2021-08-25 MED ORDER — ATORVASTATIN CALCIUM 40 MG PO TABS
40.0000 mg | ORAL_TABLET | Freq: Every day | ORAL | Status: DC
  Administered 2021-08-26 – 2021-08-29 (×4): 40 mg via ORAL
  Filled 2021-08-25 (×5): qty 1

## 2021-08-25 MED ORDER — ONDANSETRON HCL 4 MG/2ML IJ SOLN: 4.0000 mg | Freq: Four times a day (QID) | INTRAMUSCULAR | Status: DC | PRN

## 2021-08-25 MED ORDER — SODIUM CHLORIDE 0.9 % IV SOLN
1.0000 g | Freq: Three times a day (TID) | INTRAVENOUS | Status: AC
  Administered 2021-08-26 – 2021-08-29 (×12): 1 g via INTRAVENOUS
  Filled 2021-08-25 (×13): qty 1

## 2021-08-25 NOTE — H&P (Addendum)
TRH H&P    Patient Demographics:    Travis Salazar, is a 85 y.o. male  MRN: [id number]  DOB - 1936-04-27  Admit Date - 08/25/2021  Referring MD/NP/PA: Dr. Langston Masker  Outpatient Primary MD for the patient is [name], MD  Patient coming from: Home  Chief complaint-left leg swelling   HPI:    Travis Salazar  is a 85 y.o. male, with history of CVA, pulmonary embolism on Eliquis, hypertension, seizure disorder with recent admission for status epilepticus requiring intubation and mechanical ventilation, urinary retention with Foley catheter placement since 08/15/2021 was discharged recently from hospital on 08/23/2021 presented to the ED with complaints of left leg swelling.  Patient states that the swelling started yesterday it was very painful and he was concerned that he may have developed DVT as he has a history of PE in the past. Apparently patient was diagnosed with pulmonary embolism in June and was prescribed Eliquis starter pack.  Patient says that after he ran out of Eliquis he could not get refills as he could not afford it.  He was out of Eliquis for few weeks.  He was started back on Eliquis when he came back to the hospital during last admission. ED provider called and discussed with oncologist Dr. Marin Olp, who recommended the patient be started on heparin or Lovenox along with warfarin this time.  He denies nausea vomiting or diarrhea Denies chest pain or shortness of breath Denies abdominal pain Denies blurred vision Denies syncope    Review of systems:    In addition to the HPI above,    All other systems reviewed and are negative.    Past History of the following :    Past Medical History:  Diagnosis Date   Hyperlipidemia    Hypertension    Stroke [facility])       Past Surgical History:  Procedure Laterality Date   LIPOMA EXCISION     neck   PROSTATECTOMY     due to prostate  cancer      Social History:      Social History   Tobacco Use   Smoking status: Former    Packs/day: 1.00    Years: 50.00    Pack years: 50.00    Types: Cigarettes    Quit date: 12/15/2009    Years since quitting: 11.7   Smokeless tobacco: Never  Substance Use Topics   Alcohol use: No    Alcohol/week: 0.0 standard drinks       Family History :     Family History  Problem Relation Age of Onset   COPD Mother    Heart attack Father       Home Medications:   Prior to Admission medications   Medication Sig Start Date End Date Taking? Authorizing Provider  amLODipine (NORVASC) 10 MG tablet TAKE 1 TABLET BY MOUTH EVERY DAY Patient taking differently: Take 10 mg by mouth daily. 04/18/21   [name], MD  apixaban (ELIQUIS) 5 MG TABS tablet Take 1 tablet (5 mg total)  by mouth 2 (two) times daily. 08/23/21 09/22/21  Elodia Florence., MD  atorvastatin (LIPITOR) 40 MG tablet TAKE 1 TABLET (40 MG TOTAL) BY MOUTH DAILY AT 6 PM. 11/11/20   [name], MD  bethanechol (URECHOLINE) 10 MG tablet Take 1 tablet (10 mg total) by mouth 3 (three) times daily. 08/23/21 09/22/21  Elodia Florence., MD  colchicine 0.6 MG tablet Take 1 tablet (0.6 mg total) by mouth as needed (gout). Patient not taking: Reported on 08/15/2021 05/28/21   Patrecia Pour, MD  levETIRAcetam (KEPPRA) 1000 MG tablet Take 1 tablet (1,000 mg total) by mouth 2 (two) times daily. 08/23/21 09/22/21  Elodia Florence., MD  metoprolol tartrate (LOPRESSOR) 25 MG tablet Take one-half tablet (12.5 mg total) by mouth 2 (two) times daily. 08/23/21 09/22/21  Elodia Florence., MD  tamsulosin (FLOMAX) 0.4 MG CAPS capsule Take 1 capsule (0.4 mg total) by mouth daily. 08/24/21 09/23/21  Elodia Florence., MD     Allergies:    No Known Allergies   Physical Exam:   Vitals  Blood pressure 117/70, pulse 88, temperature 98.7 F (37.1 C), temperature source Oral, resp. rate 18, SpO2 97 %.  1.   General: Appears in no acute distress  2. Psychiatric: Alert, oriented x3, intact insight and judgment  3. Neurologic: Cranial nerves II through XII grossly intact, no focal deficit noted  4. HEENMT:  Atraumatic normocephalic, extraocular muscles are intact  5. Respiratory : Clear to auscultation bilaterally  6. Cardiovascular : S1-S2, regular, no murmur auscultated  7. Gastrointestinal:  Abdomen is soft, nontender, no organomegaly  8. Skin:  No rashes noted  9.Musculoskeletal:  Left lower extremity is edematous, Homans' sign negative    Data Review:    CBC Recent Labs  Lab 08/22/21 0829 08/23/21 0407 08/25/21 1758  WBC 7.7 8.1 14.0*  HGB 12.2* 12.0* 12.8*  HCT 37.6* 37.0* 39.9  PLT 157 175 286  MCV 91.5 90.7 92.6  MCH 29.7 29.4 29.7  MCHC 32.4 32.4 32.1  RDW 14.0 13.8 13.5  LYMPHSABS 1.8 1.9 1.7  MONOABS 0.8 0.8 1.3*  EOSABS 0.3 0.4 0.1  BASOSABS 0.0 0.1 0.1   ------------------------------------------------------------------------------------------------------------------  Results for orders placed or performed during the hospital encounter of 08/25/21 (from the past 48 hour(s))  CBC with Differential     Status: Abnormal   Collection Time: 08/25/21  5:58 PM  Result Value Ref Range   WBC 14.0 (H) 4.0 - 10.5 K/uL   RBC 4.31 4.22 - 5.81 MIL/uL   Hemoglobin 12.8 (L) 13.0 - 17.0 g/dL   HCT 39.9 39.0 - 52.0 %   MCV 92.6 80.0 - 100.0 fL   MCH 29.7 26.0 - 34.0 pg   MCHC 32.1 30.0 - 36.0 g/dL   RDW 13.5 11.5 - 15.5 %   Platelets 286 150 - 400 K/uL   nRBC 0.0 0.0 - 0.2 %   Neutrophils Relative % 77 %   Neutro Abs 10.7 (H) 1.7 - 7.7 K/uL   Lymphocytes Relative 12 %   Lymphs Abs 1.7 0.7 - 4.0 K/uL   Monocytes Relative 9 %   Monocytes Absolute 1.3 (H) 0.1 - 1.0 K/uL   Eosinophils Relative 1 %   Eosinophils Absolute 0.1 0.0 - 0.5 K/uL   Basophils Relative 0 %   Basophils Absolute 0.1 0.0 - 0.1 K/uL   Immature Granulocytes 1 %   Abs Immature  Granulocytes 0.11 (H) 0.00 - 0.07 K/uL  Comment: Performed at [facility], [place] [address] [place], [place] [address]  Comprehensive metabolic panel     Status: Abnormal   Collection Time: 08/25/21  5:58 PM  Result Value Ref Range   Sodium 134 (L) 135 - 145 mmol/L   Potassium 4.0 3.5 - 5.1 mmol/L   Chloride 100 98 - 111 mmol/L   CO2 25 22 - 32 mmol/L   Glucose, Bld 100 (H) 70 - 99 mg/dL    Comment: Glucose reference range applies only to samples taken after fasting for at least 8 hours.   BUN 13 8 - 23 mg/dL   Creatinine, Ser 0.77 0.61 - 1.24 mg/dL   Calcium 9.3 8.9 - 10.3 mg/dL   Total Protein 7.0 6.5 - 8.1 g/dL   Albumin 2.9 (L) 3.5 - 5.0 g/dL   AST 40 15 - 41 U/L   ALT 38 0 - 44 U/L   Alkaline Phosphatase 57 38 - 126 U/L   Total Bilirubin 1.1 0.3 - 1.2 mg/dL   GFR, Estimated >60 >60 mL/min    Comment: (NOTE) Calculated using the CKD-EPI Creatinine Equation (2021)    Anion gap 9 5 - 15    Comment: Performed at [facility] [address], [place], [place] [address]    Chemistries  Recent Labs  Lab 08/22/21 [phone] 08/23/21 0407 08/25/21 1758  NA 133* 132* 134*  K 3.7 3.8 4.0  CL 99 98 100  CO2 '27 26 25  '$ GLUCOSE 90 93 100*  BUN '16 13 13  '$ CREATININE 0.67 0.61 0.77  CALCIUM 8.7* 9.0 9.3  MG  --  2.1  --   AST 38 35 40  ALT 36 36 38  ALKPHOS 47 49 57  BILITOT 0.7 0.5 1.1   ------------------------------------------------------------------------------------------------------------------  ------------------------------------------------------------------------------------------------------------------ GFR: Estimated Creatinine Clearance: 76.2 mL/min (by C-G formula based on SCr of 0.77 mg/dL). Liver Function Tests: Recent Labs  Lab 08/22/21 0829 08/23/21 0407 08/25/21 1758  AST 38 35 40  ALT 36 36 38  ALKPHOS 47 49 57  BILITOT 0.7 0.5 1.1  PROT 5.7* 5.8* 7.0  ALBUMIN 2.6* 2.6* 2.9*   No results for input(s): LIPASE, AMYLASE in the last 168  hours. No results for input(s): AMMONIA in the last 168 hours. Coagulation Profile: No results for input(s): INR, PROTIME in the last 168 hours. Cardiac Enzymes: No results for input(s): CKTOTAL, CKMB, CKMBINDEX, TROPONINI in the last 168 hours. BNP (last 3 results) No results for input(s): PROBNP in the last 8760 hours. HbA1C: No results for input(s): HGBA1C in the last 72 hours. CBG: No results for input(s): GLUCAP in the last 168 hours. Lipid Profile: No results for input(s): CHOL, HDL, LDLCALC, TRIG, CHOLHDL, LDLDIRECT in the last 72 hours. Thyroid Function Tests: No results for input(s): TSH, T4TOTAL, FREET4, T3FREE, THYROIDAB in the last 72 hours. Anemia Panel: No results for input(s): VITAMINB12, FOLATE, FERRITIN, TIBC, IRON, RETICCTPCT in the last 72 hours.  --------------------------------------------------------------------------------------------------------------- Urine analysis:    Component Value Date/Time   COLORURINE YELLOW 08/18/2021 0956   APPEARANCEUR CLEAR 08/18/2021 0956   LABSPEC 1.018 08/18/2021 0956   PHURINE 6.0 08/18/2021 0956   GLUCOSEU NEGATIVE 08/18/2021 0956   HGBUR SMALL (A) 08/18/2021 0956   BILIRUBINUR NEGATIVE 08/18/2021 0956   KETONESUR NEGATIVE 08/18/2021 0956   PROTEINUR NEGATIVE 08/18/2021 0956   NITRITE NEGATIVE 08/18/2021 0956   LEUKOCYTESUR NEGATIVE 08/18/2021 0956      Imaging Results:    DG Chest 2 View  Result  Date: 08/25/2021 CLINICAL DATA:  Infection, possible DVT EXAM: CHEST - 2 VIEW COMPARISON:  08/13/2021 FINDINGS: Lungs are clear.  No pleural effusion or pneumothorax. The heart is normal in size.  Thoracic aortic atherosclerosis. Mild degenerative changes of the visualized thoracolumbar spine. IMPRESSION: Normal chest radiographs. Electronically Signed   By: Julian Hy M.D.   On: 08/25/2021 20:49   VAS [place] LOWER EXTREMITY VENOUS (DVT) (ONLY [facility] & [facility])  Result Date: 08/25/2021  Lower Venous DVT Study Patient Name:   Demetri Dalpiaz.  Date of Exam:   08/25/2021 Medical Rec #: [id number]        Accession #:    [id number] Date of Birth: 09-10-1936        Patient Gender: M Patient Age:   4 years Exam Location:  [facility] Procedure:      VAS [place] LOWER EXTREMITY VENOUS (DVT) Referring Phys: Beverley Fiedler SOTO --------------------------------------------------------------------------------  Indications: Pain, and Swelling.  Risk Factors: PE 05/27/21 Cancer Pulmonary mass Recently admitted with seizure, discharged 08/22/21. Acute urinary retention, has foley. Anticoagulation: Eliquis. Limitations: Body habitus, edema, and poor ultrasound/tissue interface. Comparison Study: No prior study on file Performing Technologist: Sharion Dove RVS  Examination Guidelines: A complete evaluation includes B-mode imaging, spectral Doppler, color Doppler, and power Doppler as needed of all accessible portions of each vessel. Bilateral testing is considered an integral part of a complete examination. Limited examinations for reoccurring indications may be performed as noted. The reflux portion of the exam is performed with the patient in reverse Trendelenburg.  +-----+---------------+---------+-----------+----------+--------------+ RIGHTCompressibilityPhasicitySpontaneityPropertiesThrombus Aging +-----+---------------+---------+-----------+----------+--------------+ CFV  Full           Yes      Yes                                 +-----+---------------+---------+-----------+----------+--------------+   +---------+---------------+---------+-----------+----------+-------------------+ LEFT     CompressibilityPhasicitySpontaneityPropertiesThrombus Aging      +---------+---------------+---------+-----------+----------+-------------------+ CFV      None           No       No                                       +---------+---------------+---------+-----------+----------+-------------------+ SFJ      None                                                              +---------+---------------+---------+-----------+----------+-------------------+ FV Prox  None                                                             +---------+---------------+---------+-----------+----------+-------------------+ FV Mid   None                                                             +---------+---------------+---------+-----------+----------+-------------------+  FV DistalNone                                                             +---------+---------------+---------+-----------+----------+-------------------+ PFV                                                   Not well visualized +---------+---------------+---------+-----------+----------+-------------------+ POP      None           No       No                                       +---------+---------------+---------+-----------+----------+-------------------+ PTV      None                                                             +---------+---------------+---------+-----------+----------+-------------------+ PERO                                                  Not well visualized +---------+---------------+---------+-----------+----------+-------------------+ EIV      None                                         Acute               +---------+---------------+---------+-----------+----------+-------------------+ CIV                                                   Not visualized      +---------+---------------+---------+-----------+----------+-------------------+    Summary: RIGHT: - No evidence of common femoral vein obstruction.  LEFT: - Findings consistent with acute deep vein thrombosis involving the left common femoral vein, SF junction, left femoral vein, left popliteal vein, left posterior tibial veins, and external iliac vein.  *See table(s) above for measurements and observations.    Preliminary       Assessment &  Plan:    Active Problems:   DVT (deep venous thrombosis) (HCC)   Left lower extremity DVT-venous duplex of left lower extremity showed acute DVT involving left common femoral vein, SL junction, left femoral vein, left popliteal vein, left posterior tibial vein and external iliac vein.  We will start patient on heparin per pharmacy.  Hematology has been consulted, Dr. Marin Olp recommended warfarin with heparin bridging.Will start full dose heparin with warfarin. Seizure disorder-patient recently had status epilepticus requiring intubation and mechanical ventilation, will continue with Keppra. Urinary retention-patient has Foley catheter placed on 08/15/2021, urology recommended to continue with Flomax and follow-up in the  urology clinic for voiding trial.  We will continue with Foley catheter at this time. History of ESBL UTI-urine culture from 08/18/2021 grew 40,000 colonies per mL of ESBL E. coli, sensitive to meropenem.  Patient received 3 days of IV antibiotics.  ID was consulted and recommended no treatment since patient had no symptoms.  Will empirically start on meropenem as WBC is 14,000 today.  Though he denies dysuria.  We will place him on contact precautions. Hypertension-continue amlodipine, metoprolol Right upper lobe mass-concern for cancer but patient did not pursue further management.  He is not interested in getting treatment for cancer at this time. Hyperlipidemia-continue Lipitor    DVT Prophylaxis-Heparin  AM Labs Ordered, also please review Full Orders  Family Communication: Admission, patients condition and plan of care including tests being ordered have been discussed with the patient  who indicate understanding and agree with the plan and Code Status.  Code Status: Full code  Admission status: Inpatient :The appropriate admission status for this patient is INPATIENT. Inpatient status is judged to be reasonable and necessary in order to provide the required intensity of  service to ensure the patient's safety. The patient's presenting symptoms, physical exam findings, and initial radiographic and laboratory data in the context of their chronic comorbidities is felt to place them at high risk for further clinical deterioration. Furthermore, it is not anticipated that the patient will be medically stable for discharge from the hospital within 2 midnights of admission. The following factors support the admission status of inpatient.     The patient's presenting symptoms include left leg swelling. The worrisome physical exam findings include DVT. The initial radiographic and laboratory data are worrisome because of DVT. The chronic co-morbidities include history of seizures.       * I certify that at the point of admission it is my clinical judgment that the patient will require inpatient hospital care spanning beyond 2 midnights from the point of admission due to high intensity of service, high risk for further deterioration and high frequency of surveillance required.*  Time spent in minutes : 60 minutes   Gracelynn Bircher S Annabelle Rexroad M.D

## 2021-08-25 NOTE — Progress Notes (Addendum)
VASCULAR LAB   Left lower extremity venous duplex has been performed.  See CV proc for preliminary results.  Messaged results to Janeece Fitting, PA-C via secure chat  Sharion Dove, RVT 08/25/2021, 6:29 PM

## 2021-08-25 NOTE — ED Provider Notes (Signed)
[place] EMERGENCY DEPARTMENT Provider Note   CSN: [id number] Arrival date & time: 08/25/21  1647     History Chief Complaint  Patient presents with   Possible DVT    Travis Salazar. is a 85 y.o. male With a history of seizures, acute PE, on Eliquis, presenting to the emergency department with left leg swelling and pain.  Patient reports was discharged in the hospital about 3 days ago.  He noticed his left leg seem to be swelling up more and was aching near the top part 2 days ago.  He has been compliant with his Eliquis at home.  He denies new numbness or weakness in his left leg.  Per medical record review the patient was admitted to the hospital at the end of August for status epilepticus, discharged September 8, 3 days ago.  He was discharged with a Foley catheter for acute urinary retention  HPI     Past Medical History:  Diagnosis Date   Hyperlipidemia    Hypertension    Stroke [facility])     Patient Active Problem List   Diagnosis Date Noted   DVT (deep venous thrombosis) ([place]) 08/25/2021   Acute urinary retention 08/18/2021   Status epilepticus ([place]) 08/13/2021   Acute pulmonary embolism without acute cor pulmonale (HCC) 05/27/2021   Demand ischemia ([place]) 05/27/2021   CAD (coronary artery disease) 05/27/2021   Emphysema lung ([place]) 05/27/2021   Acute left hemiparesis (HCC) 04/30/2020   Paroxysmal supraventricular tachycardia ([place]) 04/30/2020   Pulmonary mass 01/07/2020   CVA (cerebral vascular accident) ([place]) 06/26/2019   Wrist swelling, right 06/03/2017   S/P prostatectomy 10/08/2016   Snores 10/08/2016   Essential (primary) hypertension 09/27/2015   History of tobacco use 09/27/2015   Hypercholesterolemia 09/27/2015   Blood glucose elevated 09/27/2015   Adiposity 09/27/2015    Past Surgical History:  Procedure Laterality Date   LIPOMA EXCISION     neck   PROSTATECTOMY     due to prostate cancer       Family History  Problem  Relation Age of Onset   COPD Mother    Heart attack Father     Social History   Tobacco Use   Smoking status: Former    Packs/day: 1.00    Years: 50.00    Pack years: 50.00    Types: Cigarettes    Quit date: 12/15/2009    Years since quitting: 11.7   Smokeless tobacco: Never  Vaping Use   Vaping Use: Never used  Substance Use Topics   Alcohol use: No    Alcohol/week: 0.0 standard drinks   Drug use: No    Home Medications Prior to Admission medications   Medication Sig Start Date End Date Taking? Authorizing Provider  amLODipine (NORVASC) 10 MG tablet TAKE 1 TABLET BY MOUTH EVERY DAY Patient taking differently: Take 10 mg by mouth daily. 04/18/21   [name], MD  apixaban (ELIQUIS) 5 MG TABS tablet Take 1 tablet (5 mg total) by mouth 2 (two) times daily. 08/23/21 09/22/21  Elodia Florence., MD  atorvastatin (LIPITOR) 40 MG tablet TAKE 1 TABLET (40 MG TOTAL) BY MOUTH DAILY AT 6 PM. 11/11/20   [name], MD  bethanechol (URECHOLINE) 10 MG tablet Take 1 tablet (10 mg total) by mouth 3 (three) times daily. 08/23/21 09/22/21  Elodia Florence., MD  colchicine 0.6 MG tablet Take 1 tablet (0.6 mg total) by mouth as needed (gout). Patient not  taking: Reported on 08/15/2021 05/28/21   Patrecia Pour, MD  levETIRAcetam (KEPPRA) 1000 MG tablet Take 1 tablet (1,000 mg total) by mouth 2 (two) times daily. 08/23/21 09/22/21  Elodia Florence., MD  metoprolol tartrate (LOPRESSOR) 25 MG tablet Take one-half tablet (12.5 mg total) by mouth 2 (two) times daily. 08/23/21 09/22/21  Elodia Florence., MD  tamsulosin (FLOMAX) 0.4 MG CAPS capsule Take 1 capsule (0.4 mg total) by mouth daily. 08/24/21 09/23/21  Elodia Florence., MD    Allergies    Patient has no known allergies.  Review of Systems   Review of Systems  Constitutional:  Negative for chills and fever.  Respiratory:  Negative for cough and shortness of breath.   Cardiovascular:  Negative for chest  pain and palpitations.  Gastrointestinal:  Negative for abdominal pain and vomiting.  Musculoskeletal:  Positive for arthralgias and myalgias.  Skin:  Negative for color change and rash.  Neurological:  Negative for seizures, syncope and light-headedness.  All other systems reviewed and are negative.  Physical Exam Updated Vital Signs BP 126/70   Pulse 95   Temp 98.7 F (37.1 C) (Oral)   Resp 18   Ht '5\' 8"'$  (1.727 m)   Wt 86.7 kg   SpO2 97%   BMI 29.06 kg/m   Physical Exam Constitutional:      General: He is not in acute distress. HENT:     Head: Normocephalic and atraumatic.  Eyes:     Conjunctiva/sclera: Conjunctivae normal.     Pupils: Pupils are equal, round, and reactive to light.  Cardiovascular:     Rate and Rhythm: Normal rate and regular rhythm.     Pulses: Normal pulses.  Pulmonary:     Effort: Pulmonary effort is normal. No respiratory distress.  Abdominal:     General: There is no distension.     Tenderness: There is no abdominal tenderness.  Genitourinary:    Comments: Foley in place, draining yellow urine Musculoskeletal:     Right lower leg: No edema.     Left lower leg: Edema present.  Skin:    General: Skin is warm and dry.  Neurological:     General: No focal deficit present.     Mental Status: He is alert and oriented to person, place, and time. Mental status is at baseline.  Psychiatric:        Mood and Affect: Mood normal.        Behavior: Behavior normal.    ED Results / Procedures / Treatments   Labs (all labs ordered are listed, but only abnormal results are displayed) Labs Reviewed  CBC WITH DIFFERENTIAL/PLATELET - Abnormal; Notable for the following components:      Result Value   WBC 14.0 (*)    Hemoglobin 12.8 (*)    Neutro Abs 10.7 (*)    Monocytes Absolute 1.3 (*)    Abs Immature Granulocytes 0.11 (*)    All other components within normal limits  COMPREHENSIVE METABOLIC PANEL - Abnormal; Notable for the following components:    Sodium 134 (*)    Glucose, Bld 100 (*)    Albumin 2.9 (*)    All other components within normal limits  URINALYSIS, ROUTINE W REFLEX MICROSCOPIC - Abnormal; Notable for the following components:   Hgb urine dipstick MODERATE (*)    Bilirubin Urine SMALL (*)    Ketones, ur 40 (*)    All other components within normal limits  URINALYSIS, MICROSCOPIC (REFLEX) -  Abnormal; Notable for the following components:   Bacteria, UA RARE (*)    All other components within normal limits  HEPARIN LEVEL (UNFRACTIONATED) - Abnormal; Notable for the following components:   Heparin Unfractionated >1.10 (*)    All other components within normal limits  RESP PANEL BY RT-PCR (FLU A&B, COVID) ARPGX2  URINE CULTURE  PROTIME-INR  APTT  APTT  CBC  COMPREHENSIVE METABOLIC PANEL  HEPARIN LEVEL (UNFRACTIONATED)  PROTIME-INR    EKG None  Radiology DG Chest 2 View  Result Date: 08/25/2021 CLINICAL DATA:  Infection, possible DVT EXAM: CHEST - 2 VIEW COMPARISON:  08/13/2021 FINDINGS: Lungs are clear.  No pleural effusion or pneumothorax. The heart is normal in size.  Thoracic aortic atherosclerosis. Mild degenerative changes of the visualized thoracolumbar spine. IMPRESSION: Normal chest radiographs. Electronically Signed   By: Julian Hy M.D.   On: 08/25/2021 20:49   VAS [place] LOWER EXTREMITY VENOUS (DVT) (ONLY [facility] & [facility])  Result Date: 08/25/2021  Lower Venous DVT Study Patient Name:  Jode Lay.  Date of Exam:   08/25/2021 Medical Rec #: [id number]        Accession #:    [id number] Date of Birth: [birth year]        Patient Gender: M Patient Age:   45 years Exam Location:  [facility] Procedure:      VAS [place] LOWER EXTREMITY VENOUS (DVT) Referring Phys: Beverley Fiedler SOTO --------------------------------------------------------------------------------  Indications: Pain, and Swelling.  Risk Factors: PE 05/27/21 Cancer Pulmonary mass Recently admitted with seizure, discharged 08/22/21. Acute urinary retention,  has foley. Anticoagulation: Eliquis. Limitations: Body habitus, edema, and poor ultrasound/tissue interface. Comparison Study: No prior study on file Performing Technologist: Sharion Dove RVS  Examination Guidelines: A complete evaluation includes B-mode imaging, spectral Doppler, color Doppler, and power Doppler as needed of all accessible portions of each vessel. Bilateral testing is considered an integral part of a complete examination. Limited examinations for reoccurring indications may be performed as noted. The reflux portion of the exam is performed with the patient in reverse Trendelenburg.  +-----+---------------+---------+-----------+----------+--------------+ RIGHTCompressibilityPhasicitySpontaneityPropertiesThrombus Aging +-----+---------------+---------+-----------+----------+--------------+ CFV  Full           Yes      Yes                                 +-----+---------------+---------+-----------+----------+--------------+   +---------+---------------+---------+-----------+----------+-------------------+ LEFT     CompressibilityPhasicitySpontaneityPropertiesThrombus Aging      +---------+---------------+---------+-----------+----------+-------------------+ CFV      None           No       No                                       +---------+---------------+---------+-----------+----------+-------------------+ SFJ      None                                                             +---------+---------------+---------+-----------+----------+-------------------+ FV Prox  None                                                             +---------+---------------+---------+-----------+----------+-------------------+  FV Mid   None                                                             +---------+---------------+---------+-----------+----------+-------------------+ FV DistalNone                                                              +---------+---------------+---------+-----------+----------+-------------------+ PFV                                                   Not well visualized +---------+---------------+---------+-----------+----------+-------------------+ POP      None           No       No                                       +---------+---------------+---------+-----------+----------+-------------------+ PTV      None                                                             +---------+---------------+---------+-----------+----------+-------------------+ PERO                                                  Not well visualized +---------+---------------+---------+-----------+----------+-------------------+ EIV      None                                         Acute               +---------+---------------+---------+-----------+----------+-------------------+ CIV                                                   Not visualized      +---------+---------------+---------+-----------+----------+-------------------+    Summary: RIGHT: - No evidence of common femoral vein obstruction.  LEFT: - Findings consistent with acute deep vein thrombosis involving the left common femoral vein, SF junction, left femoral vein, left popliteal vein, left posterior tibial veins, and external iliac vein.  *See table(s) above for measurements and observations.    Preliminary     Procedures Procedures   Medications Ordered in ED Medications  amLODipine (NORVASC) tablet 10 mg (has no administration in time range)  atorvastatin (LIPITOR) tablet 40 mg (has no administration in time range)  metoprolol tartrate (LOPRESSOR) tablet 12.5 mg (12.5 mg Oral Given 08/25/21 2300)  bethanechol (URECHOLINE) tablet 10 mg (10 mg Oral Given 08/25/21 2336)  tamsulosin (FLOMAX) capsule 0.4 mg (has no administration in time range)  levETIRAcetam (KEPPRA) tablet 1,000 mg (1,000 mg Oral Given 08/25/21 2300)  0.9 %  sodium  chloride infusion ( Intravenous New Bag/Given 08/25/21 2259)  acetaminophen (TYLENOL) tablet 650 mg (has no administration in time range)    Or  acetaminophen (TYLENOL) suppository 650 mg (has no administration in time range)  ondansetron (ZOFRAN) tablet 4 mg (has no administration in time range)    Or  ondansetron (ZOFRAN) injection 4 mg (has no administration in time range)  oxyCODONE (Oxy IR/ROXICODONE) immediate release tablet 5 mg (has no administration in time range)  heparin ADULT infusion 100 units/mL (25000 units/226m) (1,600 Units/hr Intravenous New Bag/Given 08/25/21 2309)  Warfarin - Pharmacist Dosing Inpatient (has no administration in time range)  meropenem (MERREM) 1 g in sodium chloride 0.9 % 100 mL IVPB (1 g Intravenous New Bag/Given 08/26/21 0017)  warfarin (COUMADIN) tablet 7.5 mg (7.5 mg Oral Given 08/26/21 0017)    ED Course  I have reviewed the triage vital signs and the nursing notes.  Pertinent labs & imaging results that were available during my care of the patient were reviewed by me and considered in my medical decision making (see chart for details).  New diagnosis of DVT on ultrasound today, LLE. No signs or symptoms of PE at this time Patient compliant with eliquis - failed eliquis therapy   Will admit for bridging to coumadin. Labs otherwise at baseline - doubt sepsis. Neurovascularly intact & clinically doubt cerulean dolans   WBC elevated - no fever.  Hx of possible UTI - will send new culture, but hold antibiotics at this time as he appears well, no evidence of sepsis.  DG chest unremarkable.  Prior medical records reviewed.  Supplemental hx from daughter at bedside.  Clinical Course as of 08/26/21 0113  Sat Aug 25, 2021  2030 I [facility]lnoria Howard[facility] with bridging to Coumadin.  I have paged hospitalist for admission.  I discussed with the patient and his daughter verbalized agreement.  I also note that his white  blood cell count is elevated at 14,000.  He does not have any fevers or infectious symptoms at this time.  However there was concern on prior hospitalization that he was growing multidrug-resistant UTI with only 40,000 colonies, and that he underwent a few days of IV antibiotic treatments but was not discharged on any IV antibiotics.  I think is reasonable to resend a urine culture.  I will discuss further antibiotics with the hospitalist.  I will also add on an x-ray of the chest.  He does not appear septic.  He is otherwise well-appearing. [MT]    Clinical Course User Index [MT] TLangston MaskerMCarola Rhine MD    Final Clinical Impression(s) / ED Diagnoses Final diagnoses:  Acute deep vein thrombosis (DVT) of proximal vein of left lower extremity ([facility]    Rx / DC Orders ED Discharge Orders     None        TWyvonnia Dusky MD 08/26/21 [phone]

## 2021-08-25 NOTE — ED Triage Notes (Signed)
Patient coming from home complaint of pain in left leg, worried about blood clot in that leg that started yesterday

## 2021-08-25 NOTE — Progress Notes (Signed)
ANTICOAGULATION CONSULT NOTE - Initial Consult  Pharmacy Consult for Heparin Indication:  VTE treatment  No Known Allergies  Patient Measurements:   Heparin Dosing Weight: 85.9 kg  Vital Signs: Temp: 98.7 F (37.1 C) (09/10 1736) Temp Source: Oral (09/10 1736) BP: 117/70 (09/10 2100) Pulse Rate: 88 (09/10 2100)  Labs: Recent Labs    08/23/21 0407 08/25/21 1758  HGB 12.0* 12.8*  HCT 37.0* 39.9  PLT 175 286  CREATININE 0.61 0.77    Estimated Creatinine Clearance: 76.2 mL/min (by C-G formula based on SCr of 0.77 mg/dL).   Medical History: Past Medical History:  Diagnosis Date   Hyperlipidemia    Hypertension    Stroke ([place])     Medications:  (Not in a hospital admission)  Scheduled:  Infusions:  PRN:   Assessment: 37 yom with a history of seizures, acute PE, on Eliquis. Patient is presenting with  left leg swelling and pain. Heparin per pharmacy consult placed for  VTE treatment .  Patient is on apixaban prior to arrival. Last dose 9/9 per patient, but he is uncertain. He knows he did not take today. Furthermore, chart review reveals that patient has told providers that he could not afford eliquis previously stopped taking due to this, which was re-started at most recent admission.  Will require aPTT monitoring due to likely falsely high anti-Xa level secondary to DOAC use.   Hgb12.8;plt 286  Goal of Therapy:  Heparin level 0.3-0.7 units/ml Heparin level 66-102 units/ml Monitor platelets by anticoagulation protocol: Yes   Plan:  No initial heparin bolus Start heparin infusion at 1600 units/hr Check aPTT & anti-Xa level in 6-8 hours and daily while on heparin Continue to monitor via aPTT until levels are correlated Continue to monitor H&H and platelets  Lorelei Pont, PharmD, BCPS 08/25/2021 9:50 PM ED Clinical Pharmacist -  [phone]

## 2021-08-25 NOTE — ED Provider Notes (Signed)
Emergency Medicine Provider Triage Evaluation Note  Travis Salazar. , a 85 y.o. male  was evaluated in triage.  Pt complains of blood clot which is a known problem that he is currently on Eliquis for.  He is complaining today of left upper thigh pain, that is been ongoing for the last 2 days.  He was recently discharged from the hospital 4 days ago.  Reports there was no pain then.  He denies any trauma.  Does report his left leg is more swollen than usual.  Review of Systems  Positive: Leg pain, leg swelling Negative: Shortness of breath, chest pain  Physical Exam  BP 116/60   Pulse 92   Temp 98.7 F (37.1 C) (Oral)   Resp 18   SpO2 100%  Gen:   Awake, no distress   Resp:  Normal effort  MSK:   Moves extremities without difficulty  Other:    Medical Decision Making  Medically screening exam initiated at 5:54 PM.  Appropriate orders placed.  Travis Lora. was informed that the remainder of the evaluation will be completed by another provider, this initial triage assessment does not replace that evaluation, and the importance of remaining in the ED until their evaluation is complete.  Patient here with left leg pain, does report some swelling along with some pain to the upper thigh especially with movement.  This is a new problem that began 2 days ago.  Prior history of pulmonary embolism, currently on Eliquis.   Travis Fitting, PA-C 08/25/21 1757    Margette Fast, MD 09/02/21 [phone]

## 2021-08-25 NOTE — Progress Notes (Signed)
ANTICOAGULATION CONSULT NOTE - Initial Consult  Pharmacy Consult for Heparin>warfarin Indication:  VTE treatment  No Known Allergies  Patient Measurements: Height: '5\' 8"'$  (172.7 cm) Weight: 86.7 kg (191 lb 2.2 oz) IBW/kg (Calculated) : 68.4 Heparin Dosing Weight: 85.9 kg  Vital Signs: Temp: 98.7 F (37.1 C) (09/10 1736) Temp Source: Oral (09/10 1736) BP: 126/70 (09/10 2300) Pulse Rate: 95 (09/10 2300)  Labs: Recent Labs    08/23/21 0407 08/25/21 1758 08/25/21 2255  HGB 12.0* 12.8*  --   HCT 37.0* 39.9  --   PLT 175 286  --   APTT  --   --  34  LABPROT  --   --  14.6  INR  --   --  1.1  HEPARINUNFRC  --   --  >1.10*  CREATININE 0.61 0.77  --      Estimated Creatinine Clearance: 76.2 mL/min (by C-G formula based on SCr of 0.77 mg/dL).   Medical History: Past Medical History:  Diagnosis Date   Hyperlipidemia    Hypertension    Stroke ([place])     Medications:  (Not in a hospital admission)  Scheduled:  Infusions:  PRN:   Assessment: 22 yom with a history of seizures, acute PE, on Eliquis. Patient is presenting with  left leg swelling and pain. Heparin per pharmacy consult placed for  VTE treatment .  Patient is on apixaban prior to arrival. Last dose 9/9 per patient, but he is uncertain. He knows he did not take today. Furthermore, chart review reveals that patient has told providers that he could not afford eliquis previously stopped taking due to this, which was re-started at most recent admission. Case discussed with oncology and it was suggested he change to warfarin and continue heparin/lovenox bridge.  Will require aPTT monitoring due to likely falsely high anti-Xa level secondary to DOAC use.   Hgb12.8;plt 286  Goal of Therapy:  INR goal 2-3 Heparin level 0.3-0.7 units/ml Heparin level 66-102 units/ml Monitor platelets by anticoagulation protocol: Yes   Plan:  No initial heparin bolus Start heparin infusion at 1600 units/hr Check aPTT & anti-Xa  level in 6-8 hours and daily while on heparin Continue to monitor via aPTT until levels are correlated Continue to monitor H&H and platelets Warfarin 7.'5mg'$  tonight Daily INR Education prior to discharge  Erin Hearing PharmD., BCPS Clinical Pharmacist 08/25/2021 11:40 PM

## 2021-08-26 ENCOUNTER — Encounter (HOSPITAL_COMMUNITY): Payer: Self-pay | Admitting: Family Medicine

## 2021-08-26 DIAGNOSIS — I824Y2 Acute embolism and thrombosis of unspecified deep veins of left proximal lower extremity: Secondary | ICD-10-CM

## 2021-08-26 LAB — CBC
HCT: 37.2 % — ABNORMAL LOW (ref 39.0–52.0)
Hemoglobin: 12.1 g/dL — ABNORMAL LOW (ref 13.0–17.0)
MCH: 30.2 pg (ref 26.0–34.0)
MCHC: 32.5 g/dL (ref 30.0–36.0)
MCV: 92.8 fL (ref 80.0–100.0)
Platelets: 262 10*3/uL (ref 150–400)
RBC: 4.01 MIL/uL — ABNORMAL LOW (ref 4.22–5.81)
RDW: 13.6 % (ref 11.5–15.5)
WBC: 11.9 10*3/uL — ABNORMAL HIGH (ref 4.0–10.5)
nRBC: 0 % (ref 0.0–0.2)

## 2021-08-26 LAB — PROTIME-INR
INR: 1.2 (ref 0.8–1.2)
Prothrombin Time: 15.3 seconds — ABNORMAL HIGH (ref 11.4–15.2)

## 2021-08-26 LAB — COMPREHENSIVE METABOLIC PANEL WITH GFR
ALT: 30 U/L (ref 0–44)
AST: 31 U/L (ref 15–41)
Albumin: 2.4 g/dL — ABNORMAL LOW (ref 3.5–5.0)
Alkaline Phosphatase: 49 U/L (ref 38–126)
Anion gap: 11 (ref 5–15)
BUN: 14 mg/dL (ref 8–23)
CO2: 23 mmol/L (ref 22–32)
Calcium: 8.8 mg/dL — ABNORMAL LOW (ref 8.9–10.3)
Chloride: 101 mmol/L (ref 98–111)
Creatinine, Ser: 0.75 mg/dL (ref 0.61–1.24)
GFR, Estimated: >60 mL/min (ref >60)
Glucose, Bld: 82 mg/dL (ref 70–99)
Potassium: 4.2 mmol/L (ref 3.5–5.1)
Sodium: 135 mmol/L (ref 135–145)
Total Bilirubin: 0.8 mg/dL (ref 0.3–1.2)
Total Protein: 6.2 g/dL — ABNORMAL LOW (ref 6.5–8.1)

## 2021-08-26 LAB — APTT
aPTT: 68 s — ABNORMAL HIGH (ref 24–36)
aPTT: 87 s — ABNORMAL HIGH (ref 24–36)

## 2021-08-26 LAB — HEPARIN LEVEL (UNFRACTIONATED): Heparin Unfractionated: >1.1 IU/mL — ABNORMAL HIGH (ref 0.30–0.70)

## 2021-08-26 MED ORDER — WARFARIN SODIUM 7.5 MG PO TABS
7.5000 mg | ORAL_TABLET | Freq: Once | ORAL | Status: AC
  Administered 2021-08-26: 7.5 mg via ORAL
  Filled 2021-08-26 (×2): qty 1

## 2021-08-26 MED ORDER — CHLORHEXIDINE GLUCONATE CLOTH 2 % EX PADS
6.0000 | MEDICATED_PAD | Freq: Every day | CUTANEOUS | Status: DC
  Administered 2021-08-26 – 2021-08-30 (×5): 6 via TOPICAL

## 2021-08-26 NOTE — Progress Notes (Signed)
Patient arrived to unit from ED at 1435. Alert and oriented x4. No s/s of pain. Breathing is even, nonlabored, no s/s of distress. Skin is warm and dry. Assessed skin with Desiray RN. 16 Fr foley catheter in place with collection bag hanging freely on bed hook. Heparin drip at 16 mL/hr running continuously, in place. Arrived with 1 ring, tennis shoes, jeans, and shirt in hospital bag. Oriented to room and bed controls. Bed in lowest position. Call light within reach.

## 2021-08-26 NOTE — ED Notes (Signed)
Breakfast Ordered 

## 2021-08-26 NOTE — Progress Notes (Signed)
PROGRESS NOTE    Travis Salazar.  [id number] DOB: [birth year] DOA: 08/25/2021 PCP: [name], MD    Brief Narrative:  85 y.o. male, with history of CVA, pulmonary embolism on Eliquis, hypertension, seizure disorder with recent admission for status epilepticus requiring intubation and mechanical ventilation, urinary retention with Foley catheter placement since 08/15/2021 was discharged recently from hospital on 08/23/2021 presented to the ED with complaints of left leg swelling.  Patient states that the swelling started yesterday it was very painful and he was concerned that he may have developed DVT as he has a history of PE in the past. Apparently patient was diagnosed with pulmonary embolism in June and was prescribed Eliquis starter pack.  Patient says that after he ran out of Eliquis he could not get refills as he could not afford it.  He was out of Eliquis for few weeks.  He was started back on Eliquis when he came back to the hospital during last admission. ED provider called and discussed with oncologist Dr. Marin Olp, who recommended the patient be started on heparin or Lovenox along with warfarin this time.  Assessment & Plan:   Active Problems:   DVT (deep venous thrombosis) (HCC)   Left lower extremity DVT-venous duplex of left lower extremity showed acute DVT involving left common femoral vein, SL junction, left femoral vein, left popliteal vein, left posterior tibial vein and external iliac vein.   Pt is now on coumadin with heparin bridge per Hematology recommendations.  Pt states he is able to have INR checked as outpatient by PCP Repeat cbc and INR in AM Seizure disorder-patient recently had status epilepticus requiring intubation and mechanical ventilation, will continue with Keppra. Remains seizure free thus far Urinary retention-patient has Foley catheter placed on 08/15/2021, urology recommended to continue with Flomax and follow-up in the urology clinic for  voiding trial.   Will cont with foley cath for now History of ESBL UTI-urine culture from 08/18/2021 grew 40,000 colonies per mL of ESBL E. coli, sensitive to meropenem.  Patient received 3 days of IV antibiotics.  ID was consulted and recommended no treatment since patient had no symptoms.   Pt was initially started on meropenem as presenting WBC was noted to be 14,000. Denied dysuria at time of presentation Hypertension-continue amlodipine, metoprolol Right upper lobe mass-concern for cancer but patient did not pursue further management. Pt reportedly did not wish to pursue further Hyperlipidemia-continue Lipitor   DVT prophylaxis: Coumadin with heparin bridge Code Status: Full Family Communication: Pt in room, family not at bedside  Status is: Inpatient  Remains inpatient appropriate because:Inpatient level of care appropriate due to severity of illness  Dispo: The patient is from: Home              Anticipated d/c is to: Home              Patient currently is not medically stable to d/c.   Difficult to place patient No    Consultants:  Oncology  Procedures:    Antimicrobials: Anti-infectives (From admission, onward)    Start     Dose/Rate Route Frequency Ordered Stop   08/26/21 0000  meropenem (MERREM) 1 g in sodium chloride 0.9 % 100 mL IVPB        1 g 200 mL/hr over 30 Minutes Intravenous Every 8 hours 08/25/21 2343         Subjective: Without complaints this AM  Objective: Vitals:   08/26/21 1128 08/26/21 1356 08/26/21 1438 08/26/21  1606  BP: (!) 145/78 120/74 120/68 137/67  Pulse: 97 94 86   Resp: '14 16 17   '$ Temp:   99.3 F (37.4 C) 98.4 F (36.9 C)  TempSrc:   Oral Oral  SpO2: 96% 96% 92%   Weight:   88.7 kg   Height:   '5\' 8"'$  (1.727 m)     Intake/Output Summary (Last 24 hours) at 08/26/2021 1736 Last data filed at 08/26/2021 1707 Gross per 24 hour  Intake 1044.27 ml  Output --  Net 1044.27 ml   Filed Weights   08/25/21 2150 08/26/21 1438   Weight: 86.7 kg 88.7 kg    Examination: General exam: Awake, laying in bed, in nad Respiratory system: Normal respiratory effort, no wheezing Cardiovascular system: regular rate, s1, s2 Gastrointestinal system: Soft, nondistended, positive BS Central nervous system: CN2-12 grossly intact, strength intact Extremities: Perfused, no clubbing Skin: Normal skin turgor, no notable skin lesions seen Psychiatry: Mood normal // no visual hallucinations   Data Reviewed: I have personally reviewed following labs and imaging studies  CBC: Recent Labs  Lab 08/22/21 0829 08/23/21 0407 08/25/21 1758 08/26/21 0743  WBC 7.7 8.1 14.0* 11.9*  NEUTROABS 4.8 4.9 10.7*  --   HGB 12.2* 12.0* 12.8* 12.1*  HCT 37.6* 37.0* 39.9 37.2*  MCV 91.5 90.7 92.6 92.8  PLT 157 175 286 [id number]   Basic Metabolic Panel: Recent Labs  Lab 08/22/21 0829 08/23/21 0407 08/25/21 1758 08/26/21 0743  NA 133* 132* 134* 135  K 3.7 3.8 4.0 4.2  CL 99 98 100 101  CO2 '27 26 25 23  '$ GLUCOSE 90 93 100* 82  BUN '16 13 13 14  '$ CREATININE 0.67 0.61 0.77 0.75  CALCIUM 8.7* 9.0 9.3 8.8*  MG  --  2.1  --   --   PHOS  --  2.9  --   --    GFR: Estimated Creatinine Clearance: 77 mL/min (by C-G formula based on SCr of 0.75 mg/dL). Liver Function Tests: Recent Labs  Lab 08/22/21 0829 08/23/21 0407 08/25/21 1758 08/26/21 0743  AST 38 35 40 31  ALT 36 36 38 30  ALKPHOS 47 49 57 49  BILITOT 0.7 0.5 1.1 0.8  PROT 5.7* 5.8* 7.0 6.2*  ALBUMIN 2.6* 2.6* 2.9* 2.4*   No results for input(s): LIPASE, AMYLASE in the last 168 hours. No results for input(s): AMMONIA in the last 168 hours. Coagulation Profile: Recent Labs  Lab 08/25/21 2255 08/26/21 0743  INR 1.1 1.2   Cardiac Enzymes: No results for input(s): CKTOTAL, CKMB, CKMBINDEX, TROPONINI in the last 168 hours. BNP (last 3 results) No results for input(s): PROBNP in the last 8760 hours. HbA1C: No results for input(s): HGBA1C in the last 72 hours. CBG: No results  for input(s): GLUCAP in the last 168 hours. Lipid Profile: No results for input(s): CHOL, HDL, LDLCALC, TRIG, CHOLHDL, LDLDIRECT in the last 72 hours. Thyroid Function Tests: No results for input(s): TSH, T4TOTAL, FREET4, T3FREE, THYROIDAB in the last 72 hours. Anemia Panel: No results for input(s): VITAMINB12, FOLATE, FERRITIN, TIBC, IRON, RETICCTPCT in the last 72 hours. Sepsis Labs: No results for input(s): PROCALCITON, LATICACIDVEN in the last 168 hours.  Recent Results (from the past 240 hour(s))  Urine Culture     Status: Abnormal   Collection Time: 08/18/21  9:57 AM   Specimen: Urine, Clean Catch  Result Value Ref Range Status   Specimen Description URINE, CLEAN CATCH  Final   Special Requests   Final  NONE Performed at [facility], [place] [address] [place], [place] [address]    Culture (A)  Final    40,000 COLONIES/mL ESCHERICHIA COLI Confirmed Extended Spectrum Beta-Lactamase Producer (ESBL).  In bloodstream infections from ESBL organisms, carbapenems are preferred over piperacillin/tazobactam. They are shown to have a lower risk of mortality.    Report Status 08/20/2021 FINAL  Final   Organism ID, Bacteria ESCHERICHIA COLI (A)  Final      Susceptibility   Escherichia coli - MIC*    AMPICILLIN >=32 RESISTANT Resistant     CEFAZOLIN >=64 RESISTANT Resistant     CEFEPIME 16 RESISTANT Resistant     CEFTRIAXONE >=64 RESISTANT Resistant     CIPROFLOXACIN >=4 RESISTANT Resistant     GENTAMICIN <=1 SENSITIVE Sensitive     IMIPENEM <=0.25 SENSITIVE Sensitive     NITROFURANTOIN 128 RESISTANT Resistant     TRIMETH/SULFA >=320 RESISTANT Resistant     AMPICILLIN/SULBACTAM 16 INTERMEDIATE Intermediate     PIP/TAZO <=4 SENSITIVE Sensitive     * 40,000 COLONIES/mL ESCHERICHIA COLI  Resp Panel by RT-PCR (Flu A&B, Covid) Nasopharyngeal Swab     Status: None   Collection Time: 08/25/21  9:10 PM   Specimen: Nasopharyngeal Swab; Nasopharyngeal(NP) swabs in vial transport medium   Result Value Ref Range Status   SARS Coronavirus 2 by RT PCR NEGATIVE NEGATIVE Final    Comment: (NOTE) SARS-CoV-2 target nucleic acids are NOT DETECTED.  The SARS-CoV-2 RNA is generally detectable in upper respiratory specimens during the acute phase of infection. The lowest concentration of SARS-CoV-2 viral copies this assay can detect is 138 copies/mL. A negative result does not preclude SARS-Cov-2 infection and should not be used as the sole basis for treatment or other patient management decisions. A negative result may occur with  improper specimen collection/handling, submission of specimen other than nasopharyngeal swab, presence of viral mutation(s) within the areas targeted by this assay, and inadequate number of viral copies(<138 copies/mL). A negative result must be combined with clinical observations, patient history, and epidemiological information. The expected result is Negative.  Fact Sheet for Patients:  EntrepreneurPulse.com.au  Fact Sheet for Healthcare Providers:  IncredibleEmployment.be  This test is no t yet approved or cleared by the [place] FDA and  has been authorized for detection and/or diagnosis of SARS-CoV-2 by FDA under an Emergency Use Authorization (EUA). This EUA will remain  in effect (meaning this test can be used) for the duration of the COVID-19 declaration under Section 564(b)(1) of the Act, 21 U.S.C.section 360bbb-3(b)(1), unless the authorization is terminated  or revoked sooner.       Influenza A by PCR NEGATIVE NEGATIVE Final   Influenza B by PCR NEGATIVE NEGATIVE Final    Comment: (NOTE) The Xpert Xpress SARS-CoV-2/FLU/RSV plus assay is intended as an aid in the diagnosis of influenza from Nasopharyngeal swab specimens and should not be used as a sole basis for treatment. Nasal washings and aspirates are unacceptable for Xpert Xpress SARS-CoV-2/FLU/RSV testing.  Fact Sheet for  Patients: EntrepreneurPulse.com.au  Fact Sheet for Healthcare Providers: IncredibleEmployment.be  This test is not yet approved or cleared by the [place] FDA and has been authorized for detection and/or diagnosis of SARS-CoV-2 by FDA under an Emergency Use Authorization (EUA). This EUA will remain in effect (meaning this test can be used) for the duration of the COVID-19 declaration under Section 564(b)(1) of the Act, 21 U.S.C. section 360bbb-3(b)(1), unless the authorization is terminated or revoked.  Performed at [facility], [place] [address], [place], [place] [address]      Radiology Studies: DG Chest 2 View  Result Date: 08/25/2021 CLINICAL DATA:  Infection, possible DVT EXAM: CHEST - 2 VIEW COMPARISON:  08/13/2021 FINDINGS: Lungs are clear.  No pleural effusion or pneumothorax. The heart is normal in size.  Thoracic aortic atherosclerosis. Mild degenerative changes of the visualized thoracolumbar spine. IMPRESSION: Normal chest radiographs. Electronically Signed   By: Julian Hy M.D.   On: 08/25/2021 20:49   VAS [place] LOWER EXTREMITY VENOUS (DVT) (ONLY [facility] & [facility])  Result Date: 08/26/2021  Lower Venous DVT Study Patient Name:  Travis Salazar.  Date of Exam:   08/25/2021 Medical Rec #: [id number]        Accession #:    [id number] Date of Birth: 04-08-1936        Patient Gender: M Patient Age:   51 years Exam Location:  [facility] Procedure:      VAS [place] LOWER EXTREMITY VENOUS (DVT) Referring Phys: Beverley Fiedler SOTO --------------------------------------------------------------------------------  Indications: Pain, and Swelling.  Risk Factors: PE 05/27/21 Cancer Pulmonary mass Recently admitted with seizure, discharged 08/22/21. Acute urinary retention, has foley. Anticoagulation: Eliquis. Limitations: Body habitus, edema, and poor ultrasound/tissue interface. Comparison Study: No prior study on file Performing Technologist: Sharion Dove RVS   Examination Guidelines: A complete evaluation includes B-mode imaging, spectral Doppler, color Doppler, and power Doppler as needed of all accessible portions of each vessel. Bilateral testing is considered an integral part of a complete examination. Limited examinations for reoccurring indications may be performed as noted. The reflux portion of the exam is performed with the patient in reverse Trendelenburg.  +-----+---------------+---------+-----------+----------+--------------+ RIGHTCompressibilityPhasicitySpontaneityPropertiesThrombus Aging +-----+---------------+---------+-----------+----------+--------------+ CFV  Full           Yes      Yes                                 +-----+---------------+---------+-----------+----------+--------------+   +---------+---------------+---------+-----------+----------+-------------------+ LEFT     CompressibilityPhasicitySpontaneityPropertiesThrombus Aging      +---------+---------------+---------+-----------+----------+-------------------+ CFV      None           No       No                                       +---------+---------------+---------+-----------+----------+-------------------+ SFJ      None                                                             +---------+---------------+---------+-----------+----------+-------------------+ FV Prox  None                                                             +---------+---------------+---------+-----------+----------+-------------------+ FV Mid   None                                                             +---------+---------------+---------+-----------+----------+-------------------+  FV DistalNone                                                             +---------+---------------+---------+-----------+----------+-------------------+ PFV                                                   Not well visualized  +---------+---------------+---------+-----------+----------+-------------------+ POP      None           No       No                                       +---------+---------------+---------+-----------+----------+-------------------+ PTV      None                                                             +---------+---------------+---------+-----------+----------+-------------------+ PERO                                                  Not well visualized +---------+---------------+---------+-----------+----------+-------------------+ EIV      None                                         Acute               +---------+---------------+---------+-----------+----------+-------------------+ CIV                                                   Not visualized      +---------+---------------+---------+-----------+----------+-------------------+     Summary: RIGHT: - No evidence of common femoral vein obstruction.  LEFT: - Findings consistent with acute deep vein thrombosis involving the left common femoral vein, SF junction, left femoral vein, left popliteal vein, left posterior tibial veins, and external iliac vein.  *See table(s) above for measurements and observations. Electronically signed by Jamelle Haring on 08/26/2021 at 10:04:30 AM.    Final     Scheduled Meds:  amLODipine  10 mg Oral Daily   atorvastatin  40 mg Oral q1800   bethanechol  10 mg Oral TID   Chlorhexidine Gluconate Cloth  6 each Topical Daily   levETIRAcetam  1,000 mg Oral BID   metoprolol tartrate  12.5 mg Oral BID   tamsulosin  0.4 mg Oral Daily   Warfarin - Pharmacist Dosing Inpatient   Does not apply q1600   Continuous Infusions:  sodium chloride 10 mL/hr at 08/25/21 2259   heparin 1,600 Units/hr (08/26/21 1512)   meropenem (MERREM) IV 1 g (08/26/21 1539)  LOS: 1 day   Marylu Lund, MD [facility] Pager On Amion  If 7PM-7AM, please contact night-coverage 08/26/2021, 5:36 PM

## 2021-08-26 NOTE — Progress Notes (Signed)
ANTICOAGULATION CONSULT NOTE - Initial Consult  Pharmacy Consult for Heparin & Warfarin Indication:  VTE treatment  No Known Allergies  Patient Measurements: Height: '5\' 8"'$  (172.7 cm) Weight: 86.7 kg (191 lb 2.2 oz) IBW/kg (Calculated) : 68.4 Heparin Dosing Weight: 85.9 kg  Vital Signs: BP: 125/74 (09/11 0804) Pulse Rate: 98 (09/11 0804)  Labs: Recent Labs    08/25/21 1758 08/25/21 2255 08/26/21 0743 08/26/21 0800  HGB 12.8*  --  12.1*  --   HCT 39.9  --  37.2*  --   PLT 286  --  262  --   APTT  --  34 68*  --   LABPROT  --  14.6 15.3*  --   INR  --  1.1 1.2  --   HEPARINUNFRC  --  >1.10*  --  >1.10*  CREATININE 0.77  --  0.75  --      Estimated Creatinine Clearance: 76.2 mL/min (by C-G formula based on SCr of 0.75 mg/dL).   Medical History: Past Medical History:  Diagnosis Date   Hyperlipidemia    Hypertension    Stroke ([place])     Medications:  (Not in a hospital admission)  Scheduled:  Infusions:  PRN:   Assessment: 15 yom with a history of seizures, acute PE, on Eliquis. Patient is presenting with  left leg swelling and pain. Heparin per pharmacy consult placed for  VTE treatment .  Patient is on apixaban prior to arrival. Last dose 9/9 per patient, but he is uncertain. He knows he did not take today. Furthermore, chart review reveals that patient has told providers that he could not afford eliquis previously stopped taking due to this, which was re-started at most recent admission.  Warfarin started last night. 7.5 mg given.  Baseline heparin level > 1.1. Baseline aPTT 34. Heparin level falsely elevated secondary to anti-Xa use. aPTT this morning 68 which is therapeutic but borderline. Heparin level remains high.  INR this morning 1.2 (0.1 increase from last night).  Hgb 12.1; plt 262  Goal of Therapy:  Heparin level 0.3-0.7 units/ml Heparin level 66-102 units/ml Monitor platelets by anticoagulation protocol: Yes   Plan:  Warfarin 7.5 mg  tonight Will continue heparin infusion at 1600 units/hr Check aPTT & anti-Xa level in 6 hours and daily while on heparin Continue to monitor via aPTT until levels are correlated Continue to monitor H&H and platelets Daily INR Education prior to discharge  Lorelei Pont, PharmD, BCPS 08/26/2021 9:53 AM ED Clinical Pharmacist -  [phone]

## 2021-08-26 NOTE — Progress Notes (Signed)
ANTICOAGULATION CONSULT NOTE   Pharmacy Consult for Heparin & Warfarin Indication:  VTE treatment  No Known Allergies  Patient Measurements: Height: '5\' 8"'$  (172.7 cm) Weight: 88.7 kg (195 lb 8.8 oz) IBW/kg (Calculated) : 68.4 Heparin Dosing Weight: 85.9 kg  Vital Signs: Temp: 99.3 F (37.4 C) (09/11 1438) Temp Source: Oral (09/11 1438) BP: 120/68 (09/11 1438) Pulse Rate: 86 (09/11 1438)  Labs: Recent Labs    08/25/21 1758 08/25/21 2255 08/26/21 0743 08/26/21 0800 08/26/21 1434  HGB 12.8*  --  12.1*  --   --   HCT 39.9  --  37.2*  --   --   PLT 286  --  262  --   --   APTT  --  34 68*  --  87*  LABPROT  --  14.6 15.3*  --   --   INR  --  1.1 1.2  --   --   HEPARINUNFRC  --  >1.10*  --  >1.10*  --   CREATININE 0.77  --  0.75  --   --      Estimated Creatinine Clearance: 77 mL/min (by C-G formula based on SCr of 0.75 mg/dL).   Medical History: Past Medical History:  Diagnosis Date   Hyperlipidemia    Hypertension    Stroke [facility])     Medications:   sodium chloride 10 mL/hr at 08/25/21 2259   heparin 1,600 Units/hr (08/26/21 1512)   meropenem (MERREM) IV Stopped (08/26/21 [id number])     Assessment: 59 yom with a history of seizures, acute PE, on Eliquis. Patient is presenting with  left leg swelling and pain. Heparin per pharmacy consult placed for  VTE treatment .  Patient is on apixaban prior to arrival. Last dose 9/9 per patient, but he is uncertain. He knows he did not take today. Furthermore, chart review reveals that patient has told providers that he could not afford eliquis previously stopped taking due to this, which was re-started at most recent admission.  Warfarin started last night. 7.5 mg given.  INR this morning 1.2 (0.1 increase from last night).  PM f/u > aPTT at goal this afternoon. Heparin level still falsely elevated from recent Eliquis.  No overt bleeding or complications noted.  Goal of Therapy:  Heparin level 0.3-0.7 units/ml Heparin  level 66-102 units/ml Monitor platelets by anticoagulation protocol: Yes   Plan:  Warfarin 7.5 mg tonight Will continue heparin infusion at 1600 units/hr Check aPTT & anti-Xa daily while on heparin Continue to monitor via aPTT until levels are correlated Continue to monitor H&H and platelets Daily INR Education prior to discharge  [place] Crane, Roylene Reason, [place] Pharmacist  08/26/2021 3:25 PM   [facility] pharmacy phone numbers are listed on [email or web address]

## 2021-08-27 LAB — APTT
aPTT: 173 seconds (ref 24–36)
aPTT: 98 seconds — ABNORMAL HIGH (ref 24–36)
aPTT: 89 seconds — ABNORMAL HIGH (ref 24–36)

## 2021-08-27 LAB — CBC
HCT: 34.4 % — ABNORMAL LOW (ref 39.0–52.0)
Hemoglobin: 11.1 g/dL — ABNORMAL LOW (ref 13.0–17.0)
MCH: 29.4 pg (ref 26.0–34.0)
MCHC: 32.3 g/dL (ref 30.0–36.0)
MCV: 91.2 fL (ref 80.0–100.0)
Platelets: 303 10*3/uL (ref 150–400)
RBC: 3.77 MIL/uL — ABNORMAL LOW (ref 4.22–5.81)
RDW: 13.8 % (ref 11.5–15.5)
WBC: 11.3 10*3/uL — ABNORMAL HIGH (ref 4.0–10.5)
nRBC: 0 % (ref 0.0–0.2)

## 2021-08-27 LAB — HEPARIN LEVEL (UNFRACTIONATED): Heparin Unfractionated: >1.1 IU/mL — ABNORMAL HIGH (ref 0.30–0.70)

## 2021-08-27 LAB — PROTIME-INR
INR: 1.5 — ABNORMAL HIGH (ref 0.8–1.2)
Prothrombin Time: 17.9 seconds — ABNORMAL HIGH (ref 11.4–15.2)

## 2021-08-27 MED ORDER — HEPARIN (PORCINE) 25000 UT/250ML-% IV SOLN
1250.0000 [IU]/h | INTRAVENOUS | Status: DC
  Administered 2021-08-27 (×2): 1300 [IU]/h via INTRAVENOUS
  Administered 2021-08-29 – 2021-08-30 (×2): 1250 [IU]/h via INTRAVENOUS
  Filled 2021-08-27 (×4): qty 250

## 2021-08-27 MED ORDER — WARFARIN SODIUM 7.5 MG PO TABS: 7.5000 mg | ORAL_TABLET | Freq: Once | ORAL | Status: DC

## 2021-08-27 MED ORDER — WARFARIN SODIUM 5 MG PO TABS
5.0000 mg | ORAL_TABLET | Freq: Once | ORAL | Status: AC
  Administered 2021-08-27: 5 mg via ORAL
  Filled 2021-08-27: qty 1

## 2021-08-27 NOTE — Progress Notes (Signed)
ANTICOAGULATION CONSULT NOTE   Pharmacy Consult for Heparin & Warfarin Indication:  VTE treatment  No Known Allergies  Patient Measurements: Height: '5\' 8"'$  (172.7 cm) Weight: 89.8 kg (197 lb 15.6 oz) IBW/kg (Calculated) : 68.4 Heparin Dosing Weight: 85.9 kg  Vital Signs: Temp: 97.8 F (36.6 C) (09/12 1152) Temp Source: Oral (09/12 1152) BP: 105/63 (09/12 1152) Pulse Rate: 76 (09/12 0328)  Labs: Recent Labs    08/25/21 1758 08/25/21 1758 08/25/21 2255 08/26/21 0743 08/26/21 0800 08/26/21 1434 08/27/21 0050 08/27/21 1134  HGB 12.8*  --   --  12.1*  --   --  11.1*  --   HCT 39.9  --   --  37.2*  --   --  34.4*  --   PLT 286  --   --  262  --   --  303  --   APTT  --    < > 34 68*  --  87* 173* 98*  LABPROT  --   --  14.6 15.3*  --   --  17.9*  --   INR  --   --  1.1 1.2  --   --  1.5*  --   HEPARINUNFRC  --   --  >1.10*  --  >1.10*  --  >1.10*  --   CREATININE 0.77  --   --  0.75  --   --   --   --    < > = values in this interval not displayed.     Estimated Creatinine Clearance: 77.5 mL/min (by C-G formula based on SCr of 0.75 mg/dL).   Assessment: 68 yom with a history of seizures, acute PE, on Eliquis. Patient is presenting with  left leg swelling and pain. Heparin per pharmacy consult placed for  VTE treatment .  Patient is on apixaban prior to arrival. Last dose 9/9 per patient, but he is uncertain. He knows he did not take today. Furthermore, chart review reveals that patient has told providers that he could not afford Eliquis previously stopped taking due to this, which was re-started at most recent admission.  aPTT is therapeutic at 98 sec on 1300 units/hr; heparin level is >1.1. Given recent Eliquis use, will monitor anticoagulation using aPTT until aPTT and heparin levels correlate. INR is subtherapeutic at 1.5 but appears to be trending up now. No bleeding noted, CBC is stable.  Goal of Therapy:  Heparin level 0.3-0.7 units/ml Heparin level 66-102  units/ml INR 2-3 Monitor platelets by anticoagulation protocol: Yes   Plan:  Change warfarin to 5 mg PO tonight Decrease heparin infusion to 1250 units/hr Check 8h aPTT Check aPTT & heparin level daily while on heparin Continue to monitor via aPTT until levels are correlated Continue to monitor H&H and platelets Daily INR Education prior to discharge   Thank you for involving pharmacy in this patient's care.  Renold Genta, PharmD, BCPS Clinical Pharmacist Clinical phone for 08/27/2021 until 3p is [phone] 08/27/2021 1:10 PM  **Pharmacist phone directory can be found on [email or web address] listed under [place]**

## 2021-08-27 NOTE — Discharge Instructions (Addendum)
Information on my medicine - ELIQUIS (apixaban)  This medication education was reviewed with me or my healthcare representative as part of my discharge preparation.   Why was Eliquis prescribed for you? Eliquis was prescribed to treat blood clots that may have been found in the veins of your legs (deep vein thrombosis) or in your lungs (pulmonary embolism) and to reduce the risk of them occurring again.  What do You need to know about Eliquis ? The starting dose is 10 mg (two 5 mg tablets) taken TWICE daily for the FIRST SEVEN (7) DAYS, then on 09/06/21  the dose is reduced to ONE 5 mg tablet taken TWICE daily.  Eliquis may be taken with or without food.   Try to take the dose about the same time in the morning and in the evening. If you have difficulty swallowing the tablet whole please discuss with your pharmacist how to take the medication safely.  Take Eliquis exactly as prescribed and DO NOT stop taking Eliquis without talking to the doctor who prescribed the medication.  Stopping may increase your risk of developing a new blood clot.  Refill your prescription before you run out.  After discharge, you should have regular check-up appointments with your healthcare provider that is prescribing your Eliquis.    What do you do if you miss a dose? If a dose of ELIQUIS is not taken at the scheduled time, take it as soon as possible on the same day and twice-daily administration should be resumed. The dose should not be doubled to make up for a missed dose.  Important Safety Information A possible side effect of Eliquis is bleeding. You should call your healthcare provider right away if you experience any of the following: Bleeding from an injury or your nose that does not stop. Unusual colored urine (red or dark brown) or unusual colored stools (red or black). Unusual bruising for unknown reasons. A serious fall or if you hit your head (even if there is no bleeding).  Some medicines  may interact with Eliquis and might increase your risk of bleeding or clotting while on Eliquis. To help avoid this, consult your healthcare provider or pharmacist prior to using any new prescription or non-prescription medications, including herbals, vitamins, non-steroidal anti-inflammatory drugs (NSAIDs) and supplements.  This website has more information on Eliquis (apixaban): http://www.eliquis.com/eliquis/home

## 2021-08-27 NOTE — Consult Note (Signed)
   [facility] CM Inpatient Consult   08/27/2021  Travis Salazar. 29-Aug-1936 [id number]  [place] Organization [ACO] Patient: [employer] Medicare  Primary Care Provider:  [name], MD, [facility] Medicine an Embedded provider Patient is reviewed for less than 7 days readmission  Patient is active in an Embedded practice which has a chronic disease management Embedded Care Management team.   Plan: Notification sent to be sent to the [place] Management team and made aware of post hospital needs.   Please contact for further questions,  Natividad Brood, RN BSN [facility] Liaison  [phone] business mobile phone Toll free office [phone]  Fax number: [phone] [email or web address]

## 2021-08-27 NOTE — Progress Notes (Signed)
ANTICOAGULATION CONSULT NOTE   Pharmacy Consult for Heparin  Indication:  VTE treatment  No Known Allergies  Patient Measurements: Height: '5\' 8"'$  (172.7 cm) Weight: 89.8 kg (197 lb 15.6 oz) IBW/kg (Calculated) : 68.4 Heparin Dosing Weight: 85.9 kg  Vital Signs: Temp: 98.2 F (36.8 C) (09/12 1924) Temp Source: Oral (09/12 1924) BP: 109/64 (09/12 2000) Pulse Rate: 84 (09/12 2000)  Labs: Recent Labs    08/25/21 1758 08/25/21 1758 08/25/21 2255 08/26/21 0743 08/26/21 0800 08/26/21 1434 08/27/21 0050 08/27/21 1134 08/27/21 2139  HGB 12.8*  --   --  12.1*  --   --  11.1*  --   --   HCT 39.9  --   --  37.2*  --   --  34.4*  --   --   PLT 286  --   --  262  --   --  303  --   --   APTT  --    < > 34 68*  --    < > 173* 98* 89*  LABPROT  --   --  14.6 15.3*  --   --  17.9*  --   --   INR  --   --  1.1 1.2  --   --  1.5*  --   --   HEPARINUNFRC  --   --  >1.10*  --  >1.10*  --  >1.10*  --   --   CREATININE 0.77  --   --  0.75  --   --   --   --   --    < > = values in this interval not displayed.    Estimated Creatinine Clearance: 77.5 mL/min (by C-G formula based on SCr of 0.75 mg/dL).   Assessment: 43 yom with a history of seizures, acute PE, on Eliquis. Patient is presenting with  left leg swelling and pain. Heparin per pharmacy consult placed for  VTE treatment .  aPTT remains therapeutic at 89 sec on 1250 units/hr  Goal of Therapy:  Heparin level 0.3-0.7 units/ml aPTT level 66-102 units/ml INR 2-3 Monitor platelets by anticoagulation protocol: Yes   Plan:  Continue heparin infusion to 1250 units/hr Check aPTT & heparin level daily while on heparin Continue to monitor via aPTT until levels are correlated Continue to monitor H&H and platelets   Thank you for involving pharmacy in this patient's care.  Sloan Leiter, PharmD, BCPS, BCCCP Clinical Pharmacist Please refer to [facility] for [place] numbers 08/27/2021 10:37 PM  **Pharmacist phone directory can  be found on [email or web address] listed under [place]**

## 2021-08-27 NOTE — Progress Notes (Signed)
ANTICOAGULATION CONSULT NOTE   Pharmacy Consult for Heparin & Warfarin Indication:  VTE treatment  No Known Allergies  Patient Measurements: Height: '5\' 8"'$  (172.7 cm) Weight: 88.7 kg (195 lb 8.8 oz) IBW/kg (Calculated) : 68.4 Heparin Dosing Weight: 85.9 kg  Vital Signs: Temp: 98.2 F (36.8 C) (09/12 0049) Temp Source: Oral (09/12 0049) BP: 92/60 (09/12 0049) Pulse Rate: 84 (09/12 0049)  Labs: Recent Labs    08/25/21 1758 08/25/21 1758 08/25/21 2255 08/26/21 0743 08/26/21 0800 08/26/21 1434 08/27/21 0050  HGB 12.8*  --   --  12.1*  --   --  11.1*  HCT 39.9  --   --  37.2*  --   --  34.4*  PLT 286  --   --  262  --   --  303  APTT  --    < > 34 68*  --  87* 173*  LABPROT  --   --  14.6 15.3*  --   --  17.9*  INR  --   --  1.1 1.2  --   --  1.5*  HEPARINUNFRC  --   --  >1.10*  --  >1.10*  --  >1.10*  CREATININE 0.77  --   --  0.75  --   --   --    < > = values in this interval not displayed.     Estimated Creatinine Clearance: 77 mL/min (by C-G formula based on SCr of 0.75 mg/dL).  Assessment: 85 y.o. male with new DVT and h/o PE, for anticoagulation.  Previously on Eliquis, now converting to Coumadin, with heparin bridge  Goal of Therapy:  Heparin level 0.3-0.7 units/ml aPTT 66-102 sec INR 2-3 Monitor platelets by anticoagulation protocol: Yes   Plan:  Hold heparin x 90 min, then decrease heparin 1300 units/hr aPTT in 8 hours Coumadin 7.5 mg today  Phillis Knack, PharmD, BCPS

## 2021-08-27 NOTE — Progress Notes (Signed)
PROGRESS NOTE    Travis Salazar.  [id number] DOB: 1936-11-15 DOA: 08/25/2021 PCP: [name], MD    Brief Narrative:  85 y.o. male, with history of CVA, pulmonary embolism on Eliquis, hypertension, seizure disorder with recent admission for status epilepticus requiring intubation and mechanical ventilation, urinary retention with Foley catheter placement since 08/15/2021 was discharged recently from hospital on 08/23/2021 presented to the ED with complaints of left leg swelling.  Patient states that the swelling started yesterday it was very painful and he was concerned that he may have developed DVT as he has a history of PE in the past. Apparently patient was diagnosed with pulmonary embolism in June and was prescribed Eliquis starter pack.  Patient says that after he ran out of Eliquis he could not get refills as he could not afford it.  He was out of Eliquis for few weeks.  He was started back on Eliquis when he came back to the hospital during last admission. ED provider called and discussed with oncologist Dr. Marin Olp, who recommended the patient be started on heparin or Lovenox along with warfarin this time.  Assessment & Plan:   Active Problems:   DVT (deep venous thrombosis) (HCC)   Left lower extremity DVT-venous duplex of left lower extremity showed acute DVT involving left common femoral vein, SL junction, left femoral vein, left popliteal vein, left posterior tibial vein and external iliac vein.   Pt is now on coumadin with heparin bridge per Hematology recommendations.  Pt states he is able to have INR checked as outpatient by PCP Goal INR is 2-3, currently 1.5 Will repeat INR in  AM Seizure disorder-patient recently had status epilepticus requiring intubation and mechanical ventilation, will continue with Keppra per home regimen Remains seizure free thus far Urinary retention-patient has Foley catheter placed on 08/15/2021, urology recommended to continue with  Flomax and follow-up in the urology clinic for voiding trial.   Continue with foley cath History of ESBL UTI-urine culture from 08/18/2021 grew 40,000 colonies per mL of ESBL E. coli, sensitive to meropenem.  Patient received 3 days of IV antibiotics.  ID was consulted and recommended no treatment since patient had no symptoms.   Pt was initially started on meropenem as presenting WBC was noted to be 14,000. 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 Pt reportedly did not wish to pursue further at time of presentation Hyperlipidemia-continue Lipitor   DVT prophylaxis: Coumadin with heparin bridge Code Status: Full Family Communication: Pt in room, family not at bedside  Status is: Inpatient  Remains inpatient appropriate because:Inpatient level of care appropriate due to severity of illness  Dispo: The patient is from: Home              Anticipated d/c is to: Home              Patient currently is not medically stable to d/c.   Difficult to place patient No    Consultants:  Oncology  Procedures:    Antimicrobials: Anti-infectives (From admission, onward)    Start     Dose/Rate Route Frequency Ordered Stop   08/26/21 0000  meropenem (MERREM) 1 g in sodium chloride 0.9 % 100 mL IVPB        1 g 200 mL/hr over 30 Minutes Intravenous Every 8 hours 08/25/21 2343         Subjective: Reports feeling  better  Objective: Vitals:   08/27/21 0328 08/27/21 0748 08/27/21 1152 08/27/21 1555  BP: 114/67  105/63 114/66  Pulse: 76     Resp: 15 13    Temp: 98.9 F (37.2 C) 98.2 F (36.8 C) 97.8 F (36.6 C) 98.6 F (37 C)  TempSrc: Axillary Oral Oral Oral  SpO2: 97%     Weight: 89.8 kg     Height:        Intake/Output Summary (Last 24 hours) at 08/27/2021 1643 Last data filed at 08/27/2021 0700 Gross per 24 hour  Intake 854.32 ml   Output 1100 ml  Net -245.68 ml    Filed Weights   08/25/21 2150 08/26/21 1438 08/27/21 0328  Weight: 86.7 kg 88.7 kg 89.8 kg    Examination: General exam: Conversant, in no acute distress Respiratory system: normal chest rise, clear, no audible wheezing Cardiovascular system: regular rhythm, s1-s2 Gastrointestinal system: Nondistended, nontender, pos BS Central nervous system: No seizures, no tremors Extremities: No cyanosis, no joint deformities Skin: No rashes, no pallor Psychiatry: Affect normal // no auditory hallucinations   Data Reviewed: I have personally reviewed following labs and imaging studies  CBC: Recent Labs  Lab 08/22/21 0829 08/23/21 0407 08/25/21 1758 08/26/21 0743 08/27/21 0050  WBC 7.7 8.1 14.0* 11.9* 11.3*  NEUTROABS 4.8 4.9 10.7*  --   --   HGB 12.2* 12.0* 12.8* 12.1* 11.1*  HCT 37.6* 37.0* 39.9 37.2* 34.4*  MCV 91.5 90.7 92.6 92.8 91.2  PLT 157 175 286 262 [id number]    Basic Metabolic Panel: Recent Labs  Lab 08/22/21 0829 08/23/21 0407 08/25/21 1758 08/26/21 0743  NA 133* 132* 134* 135  K 3.7 3.8 4.0 4.2  CL 99 98 100 101  CO2 '27 26 25 23  '$ GLUCOSE 90 93 100* 82  BUN '16 13 13 14  '$ CREATININE 0.67 0.61 0.77 0.75  CALCIUM 8.7* 9.0 9.3 8.8*  MG  --  2.1  --   --   PHOS  --  2.9  --   --     GFR: Estimated Creatinine Clearance: 77.5 mL/min (by C-G formula based on SCr of 0.75 mg/dL). Liver Function Tests: Recent Labs  Lab 08/22/21 0829 08/23/21 0407 08/25/21 1758 08/26/21 0743  AST 38 35 40 31  ALT 36 36 38 30  ALKPHOS 47 49 57 49  BILITOT 0.7 0.5 1.1 0.8  PROT 5.7* 5.8* 7.0 6.2*  ALBUMIN 2.6* 2.6* 2.9* 2.4*    No results for input(s): LIPASE, AMYLASE in the last 168 hours. No results for input(s): AMMONIA in the last 168 hours. Coagulation Profile: Recent Labs  Lab 08/25/21 2255 08/26/21 0743 08/27/21 0050  INR 1.1 1.2 1.5*    Cardiac Enzymes: No results for input(s): CKTOTAL, CKMB, CKMBINDEX, TROPONINI in the last 168  hours. BNP (last 3 results) No results for input(s): PROBNP in the last 8760 hours. HbA1C: No results for input(s): HGBA1C in the last 72 hours. CBG: No results for input(s): GLUCAP in the last 168 hours. Lipid Profile: No results for input(s): CHOL, HDL, LDLCALC, TRIG, CHOLHDL, LDLDIRECT in the last 72 hours. Thyroid Function Tests: No results for input(s): TSH, T4TOTAL, FREET4, T3FREE, THYROIDAB in the last 72 hours. Anemia Panel: No results for input(s): VITAMINB12, FOLATE, FERRITIN, TIBC, IRON, RETICCTPCT in the last 72 hours. Sepsis Labs: No results for input(s): PROCALCITON, LATICACIDVEN in the last 168 hours.  Recent Results (from the past 240 hour(s))  Urine Culture     Status: Abnormal   Collection  Time: 08/18/21  9:57 AM   Specimen: Urine, Clean Catch  Result Value Ref Range Status   Specimen Description URINE, CLEAN CATCH  Final   Special Requests   Final    NONE Performed at [facility], [address] [place], [place] [address]    Culture (A)  Final    40,000 COLONIES/mL ESCHERICHIA COLI Confirmed Extended Spectrum Beta-Lactamase Producer (ESBL).  In bloodstream infections from ESBL organisms, carbapenems are preferred over piperacillin/tazobactam. They are shown to have a lower risk of mortality.    Report Status 08/20/2021 FINAL  Final   Organism ID, Bacteria ESCHERICHIA COLI (A)  Final      Susceptibility   Escherichia coli - MIC*    AMPICILLIN >=32 RESISTANT Resistant     CEFAZOLIN >=64 RESISTANT Resistant     CEFEPIME 16 RESISTANT Resistant     CEFTRIAXONE >=64 RESISTANT Resistant     CIPROFLOXACIN >=4 RESISTANT Resistant     GENTAMICIN <=1 SENSITIVE Sensitive     IMIPENEM <=0.25 SENSITIVE Sensitive     NITROFURANTOIN 128 RESISTANT Resistant     TRIMETH/SULFA >=320 RESISTANT Resistant     AMPICILLIN/SULBACTAM 16 INTERMEDIATE Intermediate     PIP/TAZO <=4 SENSITIVE Sensitive     * 40,000 COLONIES/mL ESCHERICHIA COLI  Resp Panel by RT-PCR (Flu  A&B, Covid) Nasopharyngeal Swab     Status: None   Collection Time: 08/25/21  9:10 PM   Specimen: Nasopharyngeal Swab; Nasopharyngeal(NP) swabs in vial transport medium  Result Value Ref Range Status   SARS Coronavirus 2 by RT PCR NEGATIVE NEGATIVE Final    Comment: (NOTE) SARS-CoV-2 target nucleic acids are NOT DETECTED.  The SARS-CoV-2 RNA is generally detectable in upper respiratory specimens during the acute phase of infection. The lowest concentration of SARS-CoV-2 viral copies this assay can detect is 138 copies/mL. A negative result does not preclude SARS-Cov-2 infection and should not be used as the sole basis for treatment or other patient management decisions. A negative result may occur with  improper specimen collection/handling, submission of specimen other than nasopharyngeal swab, presence of viral mutation(s) within the areas targeted by this assay, and inadequate number of viral copies(<138 copies/mL). A negative result must be combined with clinical observations, patient history, and epidemiological information. The expected result is Negative.  Fact Sheet for Patients:  EntrepreneurPulse.com.au  Fact Sheet for Healthcare Providers:  IncredibleEmployment.be  This test is no t yet approved or cleared by the [place] FDA and  has been authorized for detection and/or diagnosis of SARS-CoV-2 by FDA under an Emergency Use Authorization (EUA). This EUA will remain  in effect (meaning this test can be used) for the duration of the COVID-19 declaration under Section 564(b)(1) of the Act, 21 U.S.C.section 360bbb-3(b)(1), unless the authorization is terminated  or revoked sooner.       Influenza A by PCR NEGATIVE NEGATIVE Final   Influenza B by PCR NEGATIVE NEGATIVE Final    Comment: (NOTE) The Xpert Xpress SARS-CoV-2/FLU/RSV plus assay is intended as an aid in the diagnosis of influenza from Nasopharyngeal swab specimens  and should not be used as a sole basis for treatment. Nasal washings and aspirates are unacceptable for Xpert Xpress SARS-CoV-2/FLU/RSV testing.  Fact Sheet for Patients: EntrepreneurPulse.com.au  Fact Sheet for Healthcare Providers: IncredibleEmployment.be  This test is not yet approved or cleared by the [place] FDA and has been authorized for detection and/or diagnosis of SARS-CoV-2 by FDA under an Emergency Use Authorization (EUA). This EUA will remain in  effect (meaning this test can be used) for the duration of the COVID-19 declaration under Section 564(b)(1) of the Act, 21 U.S.C. section 360bbb-3(b)(1), unless the authorization is terminated or revoked.  Performed at [facility], [place] [address] [place], [place] [address]   Urine Culture     Status: Abnormal (Preliminary result)   Collection Time: 08/25/21 10:51 PM   Specimen: Urine, Random  Result Value Ref Range Status   Specimen Description URINE, RANDOM  Final   Special Requests STERILE CUP SENT  Final   Culture (A)  Final    40,000 COLONIES/mL ESCHERICHIA COLI SUSCEPTIBILITIES TO FOLLOW Performed at [facility], [place] [address] [place], [place] [address]    Report Status PENDING  Incomplete      Radiology Studies: DG Chest 2 View  Result Date: 08/25/2021 CLINICAL DATA:  Infection, possible DVT EXAM: CHEST - 2 VIEW COMPARISON:  08/13/2021 FINDINGS: Lungs are clear.  No pleural effusion or pneumothorax. The heart is normal in size.  Thoracic aortic atherosclerosis. Mild degenerative changes of the visualized thoracolumbar spine. IMPRESSION: Normal chest radiographs. Electronically Signed   By: Julian Hy M.D.   On: 08/25/2021 20:49   VAS [place] LOWER EXTREMITY VENOUS (DVT) (ONLY [facility] & [facility])  Result Date: 08/26/2021  Lower Venous DVT Study Patient Name:  Tashaun Lister.  Date of Exam:   08/25/2021 Medical Rec #: [id number]        Accession #:    [id number] Date  of Birth: January 05, 1936        Patient Gender: M Patient Age:   89 years Exam Location:  [facility] Procedure:      VAS [place] LOWER EXTREMITY VENOUS (DVT) Referring Phys: Beverley Fiedler SOTO --------------------------------------------------------------------------------  Indications: Pain, and Swelling.  Risk Factors: PE 05/27/21 Cancer Pulmonary mass Recently admitted with seizure, discharged 08/22/21. Acute urinary retention, has foley. Anticoagulation: Eliquis. Limitations: Body habitus, edema, and poor ultrasound/tissue interface. Comparison Study: No prior study on file Performing Technologist: Sharion Dove RVS  Examination Guidelines: A complete evaluation includes B-mode imaging, spectral Doppler, color Doppler, and power Doppler as needed of all accessible portions of each vessel. Bilateral testing is considered an integral part of a complete examination. Limited examinations for reoccurring indications may be performed as noted. The reflux portion of the exam is performed with the patient in reverse Trendelenburg.  +-----+---------------+---------+-----------+----------+--------------+ RIGHTCompressibilityPhasicitySpontaneityPropertiesThrombus Aging +-----+---------------+---------+-----------+----------+--------------+ CFV  Full           Yes      Yes                                 +-----+---------------+---------+-----------+----------+--------------+   +---------+---------------+---------+-----------+----------+-------------------+ LEFT     CompressibilityPhasicitySpontaneityPropertiesThrombus Aging      +---------+---------------+---------+-----------+----------+-------------------+ CFV      None           No       No                                       +---------+---------------+---------+-----------+----------+-------------------+ SFJ      None                                                              +---------+---------------+---------+-----------+----------+-------------------+  FV Prox  None                                                             +---------+---------------+---------+-----------+----------+-------------------+ FV Mid   None                                                             +---------+---------------+---------+-----------+----------+-------------------+ FV DistalNone                                                             +---------+---------------+---------+-----------+----------+-------------------+ PFV                                                   Not well visualized +---------+---------------+---------+-----------+----------+-------------------+ POP      None           No       No                                       +---------+---------------+---------+-----------+----------+-------------------+ PTV      None                                                             +---------+---------------+---------+-----------+----------+-------------------+ PERO                                                  Not well visualized +---------+---------------+---------+-----------+----------+-------------------+ EIV      None                                         Acute               +---------+---------------+---------+-----------+----------+-------------------+ CIV                                                   Not visualized      +---------+---------------+---------+-----------+----------+-------------------+     Summary: RIGHT: - No evidence of common femoral vein obstruction.  LEFT: - Findings consistent with acute deep vein thrombosis involving the left common femoral vein, SF junction, left femoral vein, left popliteal vein, left posterior tibial veins, and  external iliac vein.  *See table(s) above for measurements and observations. Electronically signed by Jamelle Haring on 08/26/2021 at 10:04:30 AM.    Final      Scheduled Meds:  amLODipine  10 mg Oral Daily   atorvastatin  40 mg Oral q1800   bethanechol  10 mg Oral TID   Chlorhexidine Gluconate Cloth  6 each Topical Daily   levETIRAcetam  1,000 mg Oral BID   metoprolol tartrate  12.5 mg Oral BID   tamsulosin  0.4 mg Oral Daily   Warfarin - Pharmacist Dosing Inpatient   Does not apply q1600   Continuous Infusions:  sodium chloride 10 mL/hr at 08/26/21 1800   heparin 1,250 Units/hr (08/27/21 1337)   meropenem (MERREM) IV Stopped (08/27/21 1440)     LOS: 2 days   Marylu Lund, MD [facility] Pager On Amion  If 7PM-7AM, please contact night-coverage 08/27/2021, 4:43 PM

## 2021-08-27 NOTE — Progress Notes (Signed)
Date and time results received: 08/27/21 0150 (use smartphrase ".now" to insert current time)  Test: aptt Critical Value: 173 Name of Provider Notified: Pharmacy called  Orders Received? Or Actions Taken?:    Instructed to pause Heparin drip.  Informed additional orders will be entered for patient on when to resume drip

## 2021-08-28 ENCOUNTER — Inpatient Hospital Stay: Payer: Medicare Other | Admitting: Family Medicine

## 2021-08-28 LAB — URINE CULTURE: Culture: 40000 — AB

## 2021-08-28 LAB — CBC
HCT: 34.5 % — ABNORMAL LOW (ref 39.0–52.0)
Hemoglobin: 11.3 g/dL — ABNORMAL LOW (ref 13.0–17.0)
MCH: 29.9 pg (ref 26.0–34.0)
MCHC: 32.8 g/dL (ref 30.0–36.0)
MCV: 91.3 fL (ref 80.0–100.0)
Platelets: 319 10*3/uL (ref 150–400)
RBC: 3.78 MIL/uL — ABNORMAL LOW (ref 4.22–5.81)
RDW: 14 % (ref 11.5–15.5)
WBC: 9.3 10*3/uL (ref 4.0–10.5)
nRBC: 0 % (ref 0.0–0.2)

## 2021-08-28 LAB — APTT: aPTT: 72 seconds — ABNORMAL HIGH (ref 24–36)

## 2021-08-28 LAB — PROTIME-INR
INR: 1.5 — ABNORMAL HIGH (ref 0.8–1.2)
Prothrombin Time: 18.3 seconds — ABNORMAL HIGH (ref 11.4–15.2)

## 2021-08-28 LAB — HEPARIN LEVEL (UNFRACTIONATED): Heparin Unfractionated: 0.91 IU/mL — ABNORMAL HIGH (ref 0.30–0.70)

## 2021-08-28 MED ORDER — WARFARIN SODIUM 7.5 MG PO TABS
7.5000 mg | ORAL_TABLET | Freq: Once | ORAL | Status: AC
  Administered 2021-08-28: 7.5 mg via ORAL
  Filled 2021-08-28: qty 1

## 2021-08-28 NOTE — Telephone Encounter (Signed)
Pt remains currently admitted

## 2021-08-28 NOTE — Evaluation (Signed)
Occupational Therapy Evaluation Patient Details Name: Travis Salazar. MRN: [id number] DOB: 1936-06-26 Today's Date: 08/28/2021   History of Present Illness Pt is an 85 year old male recently admitted 08/13/21 for status epilepticus, new onset seizure disorder. Intubated 8/29-8/31. urinary retention with Foley catheter placement since 08/15/2021.  Now readmitted for LLE edema after having to discontinue his eloquis due to financial constraints,and pt concern for DVT. Per 9/10 LE venous dopplar: Findings consistent with acute deep vein thrombosis involving the left   common femoral vein, SF junction, left femoral vein, left popliteal vein, left posterior tibial veins, and external iliac vein.   PMH: R MCA stroke, PE , HTN, RUL mass (suspect lung CA)   Clinical Impression   Patient is currently requiring assistance with ADLs including minimal assist with toileting, minimal assist with LE dressing, and with LB bathing, and setup/supervision assist with UE dressing, and Min guard with grooming at sink, all of which is below patient's typical baseline of being Modified independent with all but shower transfers at home.  During this evaluation, patient was limited by generalized weakness, impaired activity tolerance, impaired standing balance, and impaired vision, which has the potential to impact patient's safety and independence during functional mobility, as well as performance for ADLs.  Patient lives with his spouse and son, who, along with assist from daughters who live outside the home, are able to provide 24/7 supervision and assistance.  Patient demonstrates good rehab potential, and should benefit from continued skilled occupational therapy services while in acute care to maximize safety, independence and quality of life at home.  Continued occupational therapy services in the home is recommended.  ?       Recommendations for follow up therapy are one component of a multi-disciplinary discharge  planning process, led by the attending physician.  Recommendations may be updated based on patient status, additional functional criteria and insurance authorization.   Follow Up Recommendations  Supervision - Intermittent;Home health OT    Equipment Recommendations  3 in 1 bedside commode (RW if pt does not have his own.)    Recommendations for Other Services       Precautions / Restrictions Precautions Precautions: Fall Precaution Comments: h/o impulsivity with poor safety awareness.  Contact precautions for ESBL Restrictions Weight Bearing Restrictions: No      Mobility Bed Mobility Overal bed mobility: Needs Assistance Bed Mobility: Supine to Sit;Sit to Supine     Supine to sit: Supervision Sit to supine: Supervision   General bed mobility comments: supervision for safety    Transfers Overall transfer level: Needs assistance Equipment used: Rolling walker (2 wheeled);None Transfers: Sit to/from Stand Sit to Stand: Min guard Stand pivot transfers: Min assist       General transfer comment: Min As with RW for pt to ambulate in room 10' x 2 from EOB <> sink    Balance Overall balance assessment: Needs assistance Sitting-balance support: Feet supported;No upper extremity supported Sitting balance-Leahy Scale: Good     Standing balance support: Single extremity supported Standing balance-Leahy Scale: Poor Standing balance comment: needs BUE support with dynamic balance. Needs assist to manage RW and external assist with dynamic standing balance.                           ADL either performed or assessed with clinical judgement   ADL Overall ADL's : Needs assistance/impaired Eating/Feeding: Modified independent   Grooming: Standing;Min guard to [employer] Details (indicate cue type and  reason): Verbal cues. Pt with difficulty aiming to expectorate into sink, possibly due to vision deficits. May benefit from high contrast. Cues to lining up  RW to sink and standing within RW. Pt positioning self too far from sink. Multimodal cues to positioning.  Increased balance assist to Min As due to pt swaying when closed eyes for face hgyiene. Pt encouraged to hold sink and/or keep eyes open for increased safety.  Upper Body Bathing: Minimal assistance;Sitting   Lower Body Bathing: Minimal assistance;Sit to/from stand   Upper Body Dressing : Minimal assistance;Sitting   Lower Body Dressing: Minimal assistance;Sitting/lateral leans Lower Body Dressing Details (indicate cue type and reason): Min As to don socks while sitting EOB with increased time/effort. Min guard for balance. Toilet Transfer: Minimal assistance;Ambulation;RW   Toileting- Clothing Manipulation and Hygiene: Minimal assistance;Sit to/from stand       Functional mobility during ADLs: Minimal assistance;Rolling walker;Cueing for safety       Vision Baseline Vision/History: 1 Wears glasses Ability to See in Adequate Light: 2 Moderately impaired Patient Visual Report: Peripheral vision impairment Additional Comments: Per OT Evaluation 1 week ago: "Pt reports he has bil. peripheral vision loss, but unable to elaborate as to cause of this and unable to participate in formal visual assessment. He demonstrates a disorganized scan strategy when attempting to locate iems thus requiring max cues to locate itsm"     Perception     Praxis      Pertinent Vitals/Pain Pain Assessment: No/denies pain     Hand Dominance Right   Extremity/Trunk Assessment Upper Extremity Assessment RUE Deficits / Details: Pt with wasting at anatomical snuff box bil. hands clawing of both hands with Rt worse than LT esp at D4-D5. shoulder AROM limited to ~80 degrees but intact IR for peri care. RUE Coordination: decreased fine motor LUE Deficits / Details: Pt with wasting at anatomical snuff box bil. hands clawing of both hands with Rt worse than LT, esp at D4-D5. shoulder AROM limited to ~80  degrees but intact IR for peri care. LUE Coordination: decreased fine motor   Lower Extremity Assessment Lower Extremity Assessment: Defer to PT evaluation LLE Deficits / Details: Shoulder with some residual weakness from CVA but elbow-->Grip: WFL. Impaired hand extension as above. LLE Sensation: decreased proprioception   Cervical / Trunk Assessment Cervical / Trunk Assessment: Kyphotic   Communication Communication Communication: No difficulties   Cognition Arousal/Alertness: Awake/alert Behavior During Therapy: Impulsive;WFL for tasks assessed/performed Overall Cognitive Status: No family/caregiver present to determine baseline cognitive functioning                         Following Commands: Follows one step commands consistently;Follows multi-step commands inconsistently Safety/Judgement: Decreased awareness of deficits Awareness: Emergent       General Comments       Exercises     Shoulder Instructions      Home Living Family/patient expects to be discharged to:: Private residence Living Arrangements: Spouse/significant other;Children Available Help at Discharge: Family;Available 24 hours/day Type of Home: House Home Access: Stairs to enter [employer] of Steps: 1 (threshold) Entrance Stairs-Rails: None Home Layout: One level     Bathroom Shower/Tub: Teacher, early years/pre: Standard Bathroom Accessibility: Yes   Home Equipment: Shower seat;Hand held shower head;Cane - single point;Walker - 2 wheels   Additional Comments: Wife uses the RW per chart.      Prior Functioning/Environment Level of Independence: Needs assistance  Gait / Transfers Assistance  Needed: Pt reports use of a SPC as needed. He denied falls over past year. ADL's / Homemaking Assistance Needed: Pt reports that his daughter assists pt with tub transfers but pt will complete his bathing at shower chair level with his HHS. Pt reports that he completes all  other basic ADLs Mod I. Daughters or son performs cooking/cleaning/laundry and transportation.   Comments: Pt reports that he likes to stay busy.        OT Problem List: Decreased strength;Decreased activity tolerance;Impaired balance (sitting and/or standing);Impaired vision/perception;Decreased coordination;Decreased cognition;Decreased safety awareness;Decreased knowledge of use of DME or AE      OT Treatment/Interventions: Self-care/ADL training;DME and/or AE instruction;Therapeutic activities;Cognitive remediation/compensation;Visual/perceptual remediation/compensation;Patient/family education;Balance training    OT Goals(Current goals can be found in the care plan section) Acute Rehab OT Goals Patient Stated Goal: go home to family OT Goal Formulation: With patient Time For Goal Achievement: 09/11/21 Potential to Achieve Goals: Good ADL Goals Pt Will Perform Grooming: with modified independence;standing (educate on compensatory strategies for impaired vision.) Pt Will Perform Lower Body Dressing: with modified independence;sit to/from stand Pt Will Transfer to Toilet: ambulating;with supervision;regular height toilet;grab bars Pt Will Perform Toileting - Clothing Manipulation and hygiene: with modified independence;sitting/lateral leans;sit to/from stand Additional ADL Goal #1: Pt will engage in 10 min standing functional activities without loss of balance, with supervision only in order to demonstrate improved activity tolerance and balance needed to perform ADLs safely at home.  OT Frequency: Min 2X/week   Barriers to D/C:            Co-evaluation              AM-PAC OT "6 Clicks" Daily Activity     Outcome Measure Help from another person eating meals?: None Help from another person taking care of personal grooming?: A Little Help from another person toileting, which includes using toliet, bedpan, or urinal?: A Lot Help from another person bathing (including  washing, rinsing, drying)?: A Little Help from another person to put on and taking off regular upper body clothing?: A Little Help from another person to put on and taking off regular lower body clothing?: A Little 6 Click Score: 18   End of Session Equipment Utilized During Treatment: Gait belt;Rolling walker Nurse Communication: Mobility status  Activity Tolerance: Patient tolerated treatment well Patient left: with call bell/phone within reach;in bed;with bed alarm set  OT Visit Diagnosis: Unsteadiness on feet (R26.81);Other abnormalities of gait and mobility (R26.89);Other symptoms and signs involving cognitive function;Low vision, both eyes (H54.2)                Time: [id number] OT Time Calculation (min): 32 min Charges:  OT General Charges $OT Visit: 1 Visit OT Evaluation $OT Eval Low Complexity: 1 Low OT Treatments $Self Care/Home Management : 8-22 mins  Anderson Malta, OT Acute Rehab Services Office: [phone] 08/28/2021  Julien Girt 08/28/2021, 1:02 PM

## 2021-08-28 NOTE — Progress Notes (Addendum)
ANTICOAGULATION CONSULT NOTE   Pharmacy Consult for Heparin & Warfarin Indication:  VTE treatment  No Known Allergies  Patient Measurements: Height: '5\' 8"'$  (172.7 cm) Weight: 89.8 kg (197 lb 15.6 oz) IBW/kg (Calculated) : 68.4 Heparin Dosing Weight: 85.9 kg  Vital Signs: Temp: 98.7 F (37.1 C) (09/13 0733) Temp Source: Oral (09/13 0733) BP: 108/53 (09/13 0733) Pulse Rate: 80 (09/13 0733)  Labs: Recent Labs    08/25/21 1758 08/25/21 2255 08/26/21 0743 08/26/21 0800 08/26/21 1434 08/27/21 0050 08/27/21 1134 08/27/21 2139 08/28/21 0205  HGB 12.8*  --  12.1*  --   --  11.1*  --   --  11.3*  HCT 39.9  --  37.2*  --   --  34.4*  --   --  34.5*  PLT 286  --  262  --   --  303  --   --  319  APTT  --    < > 68*  --    < > 173* 98* 89* 72*  LABPROT  --    < > 15.3*  --   --  17.9*  --   --  18.3*  INR  --    < > 1.2  --   --  1.5*  --   --  1.5*  HEPARINUNFRC  --    < >  --  >1.10*  --  >1.10*  --   --  0.91*  CREATININE 0.77  --  0.75  --   --   --   --   --   --    < > = values in this interval not displayed.     Estimated Creatinine Clearance: 77.5 mL/min (by C-G formula based on SCr of 0.75 mg/dL).   Assessment: 42 yom with a history of seizures, acute PE, on Eliquis. Patient is presenting with  left leg swelling and pain. Heparin per pharmacy consult placed for  VTE treatment .  Patient is on apixaban prior to arrival. Last dose 9/9 per patient, but he is uncertain. He knows he did not take today. Furthermore, chart review reveals that patient has told providers that he could not afford Eliquis previously stopped taking due to this, which was re-started at most recent admission.  aPTT is therapeutic at 72 sec on 1250 units/hr; heparin level is 0.91. Given recent Eliquis use, will monitor anticoagulation using aPTT until aPTT and heparin levels correlate. INR is subtherapeutic at 1.5 but appears to be trending up. No bleeding noted, CBC is stable.  Goal of Therapy:   Heparin level 0.3-0.7 units/ml Heparin level 66-102 units/ml INR 2-3 Monitor platelets by anticoagulation protocol: Yes   Plan:  Warfarin 7.5 mg PO tonight Continue heparin infusion at 1250 units/hr Daily aPTT, heparin level, INR, CBC Educated on 9/12  Thank you for involving pharmacy in this patient's care.  Renold Genta, PharmD, BCPS Clinical Pharmacist Clinical phone for 08/28/2021 until 3p is [phone] 08/28/2021 11:35 AM  **Pharmacist phone directory can be found on [email or web address] listed under [facility]**

## 2021-08-28 NOTE — Evaluation (Signed)
Physical Therapy Evaluation Patient Details Name: Travis Salazar. MRN: [id number] DOB: 1936-06-27 Today's Date: 08/28/2021  History of Present Illness  Pt is an 85 year old male recently admitted 08/13/21 for status epilepticus with intubation 8/29-8/31, new onset seizure disorder. He had returned home, but is Now readmitted on 08/25/21 for LLE edema after having to discontinue his eloquis due to financial constraints. Per 9/10 LE venous dopplar: Findings consistent with acute deep vein thrombosis involving the left   common femoral vein, SF junction, left femoral vein, left popliteal vein, left posterior tibial veins, and external iliac vein.   PMH: R MCA stroke, PE , HTN, RUL mass (suspect lung CA)   Clinical Impression  Pt admitted with above diagnosis. At baseline, pt reports fairly independent with cane although he does have a RW.  Today, pt required supervision to min guard for mobility.  Ambulated 200' with cues for safety and VSS on RA.  L LE with significant edema but not painful and pt was able to mobilize and step with L LE.  He does have some decreased safety awareness and impulsivity requiring cues.  Also, needed cues for direction/avoiding objects - noted last admission pt with vision deficits.  Pt currently with functional limitations due to the deficits listed below (see PT Problem List). Pt will benefit from skilled PT to increase their independence and safety with mobility to allow discharge to the venue listed below.          Recommendations for follow up therapy are one component of a multi-disciplinary discharge planning process, led by the attending physician.  Recommendations may be updated based on patient status, additional functional criteria and insurance authorization.  Follow Up Recommendations Home health PT;Supervision for mobility/OOB    Equipment Recommendations  None recommended by PT (reports RW and cane at home)    Recommendations for Other Services        Precautions / Restrictions Precautions Precautions: Fall Precaution Comments: . Restrictions Weight Bearing Restrictions: No      Mobility  Bed Mobility Overal bed mobility: Needs Assistance Bed Mobility: Supine to Sit;Sit to Supine     Supine to sit: Supervision Sit to supine: Supervision   General bed mobility comments: supervision for safety    Transfers Overall transfer level: Needs assistance Equipment used: Rolling walker (2 wheeled) Transfers: Sit to/from Stand Sit to Stand: Min guard Stand pivot transfers: Min assist       General transfer comment: min guard for safety  Ambulation/Gait Ambulation/Gait assistance: Min guard Gait Distance (Feet): 200 Feet Assistive device: Rolling walker (2 wheeled) Gait Pattern/deviations: Step-through pattern;Trunk flexed     General Gait Details: Cues for controlled speed and RW proximity  Stairs            Wheelchair Mobility    Modified Rankin (Stroke Patients Only)       Balance Overall balance assessment: Needs assistance Sitting-balance support: Feet supported;No upper extremity supported Sitting balance-Leahy Scale: Good     Standing balance support: No upper extremity supported;Bilateral upper extremity supported Standing balance-Leahy Scale: Fair Standing balance comment: RW to ambulate but could static stand without AD                             Pertinent Vitals/Pain Pain Assessment: Faces Faces Pain Scale:  (states "it's much better") Pain Location: L leg Pain Descriptors / Indicators: Discomfort Pain Intervention(s): Limited activity within patient's tolerance;Monitored during session    Home Living Family/patient  expects to be discharged to:: Private residence Living Arrangements: Spouse/significant other;Children (wife and oldest son) Available Help at Discharge: Family;Available 24 hours/day Type of Home: House Home Access: Stairs to enter Entrance Stairs-Rails:  None Entrance Stairs-Number of Steps: 1 (threshold) Home Layout: One level Home Equipment: Shower seat;Hand held shower head;Cane - single point;Walker - 2 wheels Additional Comments: .    Prior Function Level of Independence: Needs assistance   Gait / Transfers Assistance Needed: Pt reports use of a SPC as needed. He denied falls over past year. Could ambulate in community  ADL's / Homemaking Assistance Needed: Pt reports that his daughter assists pt with tub transfers but pt will complete his bathing at shower chair level with his HHS. Pt reports that he completes all other basic ADLs Mod I. Daughters or son performs cooking/cleaning/laundry and transportation.  Comments: Pt reports that he likes to stay busy.  Pt with recent hospital admission and reports he had basically returned to baseline     Hand Dominance   Dominant Hand: Right    Extremity/Trunk Assessment   Upper Extremity Assessment Upper Extremity Assessment: Defer to OT evaluation    Lower Extremity Assessment Lower Extremity Assessment: LLE deficits/detail;RLE deficits/detail RLE Deficits / Details: WFL LLE Deficits / Details: L LE still with significant edema - pt reports much improved.  ROM WFL and MMT 4+-5/5    Cervical / Trunk Assessment Cervical / Trunk Assessment: Kyphotic  Communication   Communication: No difficulties  Cognition Arousal/Alertness: Awake/alert Behavior During Therapy: WFL for tasks assessed/performed Overall Cognitive Status: Within Functional Limits for tasks assessed                         Following Commands: Follows one step commands consistently;Follows multi-step commands inconsistently Safety/Judgement: Decreased awareness of deficits Awareness: Emergent          General Comments General comments (skin integrity, edema, etc.): VSS on RA    Exercises     Assessment/Plan    PT Assessment Patient needs continued PT services  PT Problem List Decreased  strength;Decreased mobility;Decreased safety awareness;Decreased coordination;Decreased knowledge of precautions;Decreased activity tolerance;Decreased cognition;Decreased balance;Decreased knowledge of use of DME       PT Treatment Interventions DME instruction;Therapeutic activities;Cognitive remediation;Gait training;Therapeutic exercise;Patient/family education;Balance training;Functional mobility training;Neuromuscular re-education;Stair training    PT Goals (Current goals can be found in the Care Plan section)  Acute Rehab PT Goals Patient Stated Goal: go home to family PT Goal Formulation: With patient Time For Goal Achievement: 09/11/21 Potential to Achieve Goals: Good    Frequency Min 3X/week   Barriers to discharge        Co-evaluation               AM-PAC PT "6 Clicks" Mobility  Outcome Measure Help needed turning from your back to your side while in a flat bed without using bedrails?: None Help needed moving from lying on your back to sitting on the side of a flat bed without using bedrails?: None Help needed moving to and from a bed to a chair (including a wheelchair)?: A Little Help needed standing up from a chair using your arms (e.g., wheelchair or bedside chair)?: A Little Help needed to walk in hospital room?: A Little Help needed climbing 3-5 steps with a railing? : A Little 6 Click Score: 20    End of Session Equipment Utilized During Treatment: Gait belt Activity Tolerance: Patient tolerated treatment well Patient left: in bed;with call bell/phone within  reach;with bed alarm set Nurse Communication: Mobility status PT Visit Diagnosis: Unsteadiness on feet (R26.81);Other abnormalities of gait and mobility (R26.89);Muscle weakness (generalized) (M62.81)    Time: [id number] PT Time Calculation (min) (ACUTE ONLY): 23 min   Charges:   PT Evaluation $PT Eval Low Complexity: 1 Low PT Treatments $Gait Training: 8-22 mins        Abran Richard, PT Acute  Rehab Services Pager [phone] [facility] 08/28/2021, 2:41 PM

## 2021-08-28 NOTE — Progress Notes (Addendum)
PROGRESS NOTE    Travis Salazar.  [id number] DOB: 04/23/1936 DOA: 08/25/2021 PCP: [name], MD    Brief Narrative:  85 y.o. male, with history of CVA, pulmonary embolism on Eliquis, hypertension, seizure disorder with recent admission for status epilepticus requiring intubation and mechanical ventilation, urinary retention with Foley catheter placement since 08/15/2021 was discharged recently from hospital on 08/23/2021 presented to the ED with complaints of left leg swelling.  Patient states that the swelling started yesterday it was very painful and he was concerned that he may have developed DVT as he has a history of PE in the past. Apparently patient was diagnosed with pulmonary embolism in June and was prescribed Eliquis starter pack.  Patient says that after he ran out of Eliquis he could not get refills as he could not afford it.  He was out of Eliquis for few weeks.  He was started back on Eliquis when he came back to the hospital during last admission. ED provider called and discussed with oncologist Dr. Marin Olp, who recommended the patient be started on heparin or Lovenox along with warfarin this time.  Assessment & Plan:   Active Problems:   DVT (deep venous thrombosis) (HCC)   Left lower extremity DVT-venous duplex of left lower extremity showed acute DVT involving left common femoral vein, SL junction, left femoral vein, left popliteal vein, left posterior tibial vein and external iliac vein.   Pt is now on coumadin with heparin bridge per Hematology recommendations.  Pt reports he is able  to have INR checked as outpatient by PCP Goal INR is 2-3, currently remains 1.5 Will repeat INR in  AM Seizure disorder-patient recently had status epilepticus requiring intubation and mechanical ventilation, will continue with Keppra per home regimen Remains seizure free Urinary retention-patient has Foley catheter placed on 08/15/2021, urology recommended to continue with  Flomax and follow-up in the urology clinic for voiding trial.   Continue with foley cath History of ESBL UTI-urine culture from 08/18/2021 grew 40,000 colonies per mL of ESBL E. coli, sensitive to meropenem.  Patient received 3 days of IV antibiotics.  ID was consulted and recommended no treatment since patient had no symptoms.   Pt was initially started on meropenem as presenting WBC was noted to be 14,000. 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 Pt reportedly did not wish to pursue further at time of presentation Hyperlipidemia Continued on lipitor   DVT prophylaxis: Coumadin with heparin bridge Code Status: Full Family Communication: Pt in room, family not at bedside  Status is: Inpatient  Remains inpatient appropriate because:Inpatient level of care appropriate due to severity of illness  Dispo: The patient is from: Home              Anticipated d/c is to: Home              Patient currently is not medically stable to d/c.   Difficult to place patient No   Consultants:  Oncology  Procedures:    Antimicrobials: Anti-infectives (From admission, onward)    Start     Dose/Rate Route Frequency Ordered Stop   08/26/21 0000  meropenem (MERREM) 1 g in sodium chloride 0.9 % 100 mL IVPB        1 g 200 mL/hr over 30 Minutes Intravenous Every 8 hours 08/25/21 2343  Subjective: Without complaints  Objective: Vitals:   08/28/21 0733 08/28/21 1155 08/28/21 1240 08/28/21 1705  BP: (!) 108/53 105/65 (!) 108/53 (!) 115/55  Pulse: 80 76 79 84  Resp: '16 13 15 12  '$ Temp: 98.7 F (37.1 C) 98.5 F (36.9 C)  97.7 F (36.5 C)  TempSrc: Oral Oral  Oral  SpO2: 94% 93% 93% 96%  Weight:      Height:        Intake/Output Summary (Last 24 hours) at 08/28/2021 1819 Last data filed at 08/28/2021 1500 Gross per 24 hour   Intake 771.9 ml  Output 650 ml  Net 121.9 ml    Filed Weights   08/25/21 2150 08/26/21 1438 08/27/21 0328  Weight: 86.7 kg 88.7 kg 89.8 kg    Examination: General exam: Awake, laying in bed, in nad Respiratory system: Normal respiratory effort, no wheezing Cardiovascular system: regular rate, s1, s2 Gastrointestinal system: Soft, nondistended, positive BS Central nervous system: CN2-12 grossly intact, strength intact Extremities: Perfused, no clubbing, LLE edematous Skin: Normal skin turgor, no notable skin lesions seen Psychiatry: Mood normal // no visual hallucinations   Data Reviewed: I have personally reviewed following labs and imaging studies  CBC: Recent Labs  Lab 08/22/21 0829 08/23/21 0407 08/25/21 1758 08/26/21 0743 08/27/21 0050 08/28/21 0205  WBC 7.7 8.1 14.0* 11.9* 11.3* 9.3  NEUTROABS 4.8 4.9 10.7*  --   --   --   HGB 12.2* 12.0* 12.8* 12.1* 11.1* 11.3*  HCT 37.6* 37.0* 39.9 37.2* 34.4* 34.5*  MCV 91.5 90.7 92.6 92.8 91.2 91.3  PLT 157 175 286 262 303 [id number]    Basic Metabolic Panel: Recent Labs  Lab 08/22/21 0829 08/23/21 0407 08/25/21 1758 08/26/21 0743  NA 133* 132* 134* 135  K 3.7 3.8 4.0 4.2  CL 99 98 100 101  CO2 '27 26 25 23  '$ GLUCOSE 90 93 100* 82  BUN '16 13 13 14  '$ CREATININE 0.67 0.61 0.77 0.75  CALCIUM 8.7* 9.0 9.3 8.8*  MG  --  2.1  --   --   PHOS  --  2.9  --   --     GFR: Estimated Creatinine Clearance: 77.5 mL/min (by C-G formula based on SCr of 0.75 mg/dL). Liver Function Tests: Recent Labs  Lab 08/22/21 0829 08/23/21 0407 08/25/21 1758 08/26/21 0743  AST 38 35 40 31  ALT 36 36 38 30  ALKPHOS 47 49 57 49  BILITOT 0.7 0.5 1.1 0.8  PROT 5.7* 5.8* 7.0 6.2*  ALBUMIN 2.6* 2.6* 2.9* 2.4*    No results for input(s): LIPASE, AMYLASE in the last 168 hours. No results for input(s): AMMONIA in the last 168 hours. Coagulation Profile: Recent Labs  Lab 08/25/21 2255 08/26/21 0743 08/27/21 0050 08/28/21 0205  INR 1.1 1.2  1.5* 1.5*    Cardiac Enzymes: No results for input(s): CKTOTAL, CKMB, CKMBINDEX, TROPONINI in the last 168 hours. BNP (last 3 results) No results for input(s): PROBNP in the last 8760 hours. HbA1C: No results for input(s): HGBA1C in the last 72 hours. CBG: No results for input(s): GLUCAP in the last 168 hours. Lipid Profile: No results for input(s): CHOL, HDL, LDLCALC, TRIG, CHOLHDL, LDLDIRECT in the last 72 hours. Thyroid Function Tests: No results for input(s): TSH, T4TOTAL, FREET4, T3FREE, THYROIDAB in the last 72 hours. Anemia Panel: No results for input(s): VITAMINB12, FOLATE, FERRITIN, TIBC, IRON, RETICCTPCT in the last 72 hours. Sepsis Labs: No results for input(s): PROCALCITON, LATICACIDVEN in the last 168 hours.  Recent Results (from the past 240 hour(s))  Resp Panel by RT-PCR (Flu A&B, Covid) Nasopharyngeal Swab     Status: None   Collection Time: 08/25/21  9:10 PM   Specimen: Nasopharyngeal Swab; Nasopharyngeal(NP) swabs in vial transport medium  Result Value Ref Range Status   SARS Coronavirus 2 by RT PCR NEGATIVE NEGATIVE Final    Comment: (NOTE) SARS-CoV-2 target nucleic acids are NOT DETECTED.  The SARS-CoV-2 RNA is generally detectable in upper respiratory specimens during the acute phase of infection. The lowest concentration of SARS-CoV-2 viral copies this assay can detect is 138 copies/mL. A negative result does not preclude SARS-Cov-2 infection and should not be used as the sole basis for treatment or other patient management decisions. A negative result may occur with  improper specimen collection/handling, submission of specimen other than nasopharyngeal swab, presence of viral mutation(s) within the areas targeted by this assay, and inadequate number of viral copies(<138 copies/mL). A negative result must be combined with clinical observations, patient history, and epidemiological information. The expected result is Negative.  Fact Sheet for Patients:   EntrepreneurPulse.com.au  Fact Sheet for Healthcare Providers:  IncredibleEmployment.be  This test is no t yet approved or cleared by the [place] FDA and  has been authorized for detection and/or diagnosis of SARS-CoV-2 by FDA under an Emergency Use Authorization (EUA). This EUA will remain  in effect (meaning this test can be used) for the duration of the COVID-19 declaration under Section 564(b)(1) of the Act, 21 U.S.C.section 360bbb-3(b)(1), unless the authorization is terminated  or revoked sooner.       Influenza A by PCR NEGATIVE NEGATIVE Final   Influenza B by PCR NEGATIVE NEGATIVE Final    Comment: (NOTE) The Xpert Xpress SARS-CoV-2/FLU/RSV plus assay is intended as an aid in the diagnosis of influenza from Nasopharyngeal swab specimens and should not be used as a sole basis for treatment. Nasal washings and aspirates are unacceptable for Xpert Xpress SARS-CoV-2/FLU/RSV testing.  Fact Sheet for Patients: EntrepreneurPulse.com.au  Fact Sheet for Healthcare Providers: IncredibleEmployment.be  This test is not yet approved or cleared by the [place] FDA and has been authorized for detection and/or diagnosis of SARS-CoV-2 by FDA under an Emergency Use Authorization (EUA). This EUA will remain in effect (meaning this test can be used) for the duration of the COVID-19 declaration under Section 564(b)(1) of the Act, 21 U.S.C. section 360bbb-3(b)(1), unless the authorization is terminated or revoked.  Performed at [facility], [place] [address] [place], [place] [address]   Urine Culture     Status: Abnormal   Collection Time: 08/25/21 10:51 PM   Specimen: Urine, Random  Result Value Ref Range Status   Specimen Description URINE, RANDOM  Final   Special Requests   Final    STERILE CUP SENT Performed at [facility], [place] [address], [place],  [address]    Culture (A)   Final    40,000 COLONIES/mL ESCHERICHIA COLI Confirmed Extended Spectrum Beta-Lactamase Producer (ESBL).  In bloodstream infections from ESBL organisms, carbapenems are preferred over piperacillin/tazobactam. They are shown to have a lower risk of mortality.    Report Status 08/28/2021 FINAL  Final   Organism ID, Bacteria ESCHERICHIA COLI (A)  Final      Susceptibility   Escherichia coli - MIC*    AMPICILLIN >=32 RESISTANT Resistant     CEFAZOLIN >=64 RESISTANT Resistant     CEFEPIME 16 RESISTANT Resistant     CEFTRIAXONE >=64 RESISTANT Resistant  CIPROFLOXACIN >=4 RESISTANT Resistant     GENTAMICIN <=1 SENSITIVE Sensitive     IMIPENEM <=0.25 SENSITIVE Sensitive     NITROFURANTOIN 128 RESISTANT Resistant     TRIMETH/SULFA >=320 RESISTANT Resistant     AMPICILLIN/SULBACTAM >=32 RESISTANT Resistant     PIP/TAZO <=4 SENSITIVE Sensitive     * 40,000 COLONIES/mL ESCHERICHIA COLI      Radiology Studies: No results found.  Scheduled Meds:  amLODipine  10 mg Oral Daily   atorvastatin  40 mg Oral q1800   bethanechol  10 mg Oral TID   Chlorhexidine Gluconate Cloth  6 each Topical Daily   levETIRAcetam  1,000 mg Oral BID   metoprolol tartrate  12.5 mg Oral BID   tamsulosin  0.4 mg Oral Daily   Warfarin - Pharmacist Dosing Inpatient   Does not apply q1600   Continuous Infusions:  sodium chloride 10 mL/hr at 08/27/21 1800   heparin 1,250 Units/hr (08/27/21 1800)   meropenem (MERREM) IV 1 g (08/28/21 1357)     LOS: 3 days   Marylu Lund, MD [facility] Pager On Amion  If 7PM-7AM, please contact night-coverage 08/28/2021, 6:19 PM

## 2021-08-29 DIAGNOSIS — G40909 Epilepsy, unspecified, not intractable, without status epilepticus: Secondary | ICD-10-CM

## 2021-08-29 DIAGNOSIS — R918 Other nonspecific abnormal finding of lung field: Secondary | ICD-10-CM

## 2021-08-29 LAB — CBC
HCT: 35.3 % — ABNORMAL LOW (ref 39.0–52.0)
Hemoglobin: 11.4 g/dL — ABNORMAL LOW (ref 13.0–17.0)
MCH: 29.4 pg (ref 26.0–34.0)
MCHC: 32.3 g/dL (ref 30.0–36.0)
MCV: 91 fL (ref 80.0–100.0)
Platelets: 296 10*3/uL (ref 150–400)
RBC: 3.88 MIL/uL — ABNORMAL LOW (ref 4.22–5.81)
RDW: 13.9 % (ref 11.5–15.5)
WBC: 8.2 10*3/uL (ref 4.0–10.5)
nRBC: 0 % (ref 0.0–0.2)

## 2021-08-29 LAB — PROTIME-INR
INR: 1.6 — ABNORMAL HIGH (ref 0.8–1.2)
Prothrombin Time: 18.6 seconds — ABNORMAL HIGH (ref 11.4–15.2)

## 2021-08-29 LAB — HEPARIN LEVEL (UNFRACTIONATED): Heparin Unfractionated: 0.58 IU/mL (ref 0.30–0.70)

## 2021-08-29 LAB — APTT: aPTT: 71 seconds — ABNORMAL HIGH (ref 24–36)

## 2021-08-29 MED ORDER — WARFARIN SODIUM 5 MG PO TABS
10.0000 mg | ORAL_TABLET | Freq: Once | ORAL | Status: AC
  Administered 2021-08-29: 10 mg via ORAL
  Filled 2021-08-29: qty 2

## 2021-08-29 NOTE — Progress Notes (Signed)
PROGRESS NOTE  Travis Salazar. BL:[id number] DOB: 12-02-1936 DOA: 08/25/2021 PCP: [name], MD  Brief History   85 year old man presenting with leg swelling, admitted for DVT.  Previously diagnosed with PE treated with apixaban but patient ran out of medication.  States he was unable to afford this.  In the emergency department informal consultation with hematology recommended admission with initiation of heparin or Lovenox and warfarin.  A & P  Left lower extremity DVT. Venous duplex of left lower extremity showed acute DVT involving left common femoral vein, SL junction, left femoral vein, left popliteal vein, left posterior tibial vein and external iliac vein.   -- Currently on heparin and warfarin per pharmacy.  Will discuss with hematology, I think he can either be discharged on enoxaparin bridging with warfarin or consideration can be given to Xarelto if this is affordable. --Minimal swelling on exam and perfusion appears grossly intact.  Outpatient referral to vascular surgery given extent of clot  Seizure disorder.recent status epilepticus requiring intubation and mechanical ventilation,  --Asymptomatic.  Continue Keppra.  Follow-up with neurology as an outpatient.  No driving.  Urinary retention. Foley catheter placed on 08/15/2021, urology recommended to continue with Flomax and follow-up in the urology clinic for voiding trial.   --Continue with foley cath on discharge  History of ESBL UTI. UC 08/18/2021 grew 40,000 colonies per mL of ESBL E. coli, sensitive to meropenem.  Patient received multiple days of IV antibiotics.  ID was consulted and recommended no treatment since patient had no symptoms.    Essential hypertension --Stable.  Continue amlodipine and metoprolol  Right upper lobe mass.  Concerning for possible malignancy.  Patient did not want to pursue work-up. --Follow-up as an outpatient  Disposition Plan:  Discussion:   Status is: Inpatient  Remains  inpatient appropriate because:IV treatments appropriate due to intensity of illness or inability to take PO and Inpatient level of care appropriate due to severity of illness  Dispo: The patient is from: Home              Anticipated d/c is to: Home              Patient currently is not medically stable to d/c.   Difficult to place patient No  DVT prophylaxis: heparin, warfarin   Code Status: Full Code Level of care: Telemetry Medical Family Communication:   Murray Hodgkins, MD  [facility] Direct contact: see [email or web address] (further directions at bottom of note if needed) 7PM-7AM contact night coverage as at bottom of note 08/29/2021, 7:40 PM  LOS: 4 days   Significant Hospital Events      Consults:     Procedures:    Significant Diagnostic Tests:     Micro Data:     Antimicrobials:    Interval History/Subjective  CC: f/u DVT  Feels ok Left leg swelling decreasing No pain Breathing fine  Objective   Vitals:  Vitals:   08/29/21 1700 08/29/21 1926  BP: 120/79 134/67  Pulse: 93 87  Resp: 18 12  Temp: 98 F (36.7 C) 98 F (36.7 C)  SpO2: 97% 97%    Exam: Physical Exam Vitals and nursing note reviewed.  Cardiovascular:     Rate and Rhythm: Normal rate and regular rhythm.     Heart sounds: No murmur heard. Pulmonary:     Effort: Pulmonary effort is normal. No respiratory distress.     Breath sounds: Normal breath sounds. No wheezing or rales.  Musculoskeletal:  General: Swelling (mild left leg swelling) present.     Comments: Left leg perfusion appears grossly intact  Neurological:     Mental Status: He is alert.  Psychiatric:        Mood and Affect: Mood normal.        Behavior: Behavior normal.   I have personally reviewed the labs and other data, making special note of:   Today's Data  Hgb stable at 11.4 INR 1.6  Scheduled Meds:  amLODipine  10 mg Oral Daily   atorvastatin  40 mg Oral q1800   bethanechol  10 mg Oral TID    Chlorhexidine Gluconate Cloth  6 each Topical Daily   levETIRAcetam  1,000 mg Oral BID   metoprolol tartrate  12.5 mg Oral BID   tamsulosin  0.4 mg Oral Daily   Warfarin - Pharmacist Dosing Inpatient   Does not apply q1600   Continuous Infusions:  sodium chloride 10 mL/hr at 08/27/21 1800   heparin 1,250 Units/hr (08/29/21 0541)    Active Problems:   Right lower lobe lung mass   Acute urinary retention   DVT (deep venous thrombosis) (HCC)   Seizure disorder ([place])   LOS: 4 days   How to contact the [facility] Attending or Consulting provider [employer] or covering provider during after hours [place], for this patient?  Check the care team in [facility] and look for a) attending/consulting [facility] provider listed and b) the [facility] team listed Log into [email or web address] and use [place]'s universal password to access. If you do not have the password, please contact the hospital operator. Locate the [facility] provider you are looking for under [facility] and page to a number that you can be directly reached. If you still have difficulty reaching the provider, please page the [facility] (Director on Call) for the Hospitalists listed on amion for assistance.

## 2021-08-29 NOTE — Progress Notes (Signed)
ANTICOAGULATION CONSULT NOTE   Pharmacy Consult for Heparin & Warfarin Indication:  VTE treatment  No Known Allergies  Patient Measurements: Height: '5\' 8"'$  (172.7 cm) Weight: 89.8 kg (197 lb 15.6 oz) IBW/kg (Calculated) : 68.4 Heparin Dosing Weight: 85.9 kg  Vital Signs: Temp: 98.1 F (36.7 C) (09/14 0746) Temp Source: Oral (09/14 0746) BP: 131/62 (09/14 0746) Pulse Rate: 77 (09/14 0746)  Labs: Recent Labs    08/27/21 0050 08/27/21 1134 08/27/21 2139 08/28/21 0205 08/29/21 0050  HGB 11.1*  --   --  11.3* 11.4*  HCT 34.4*  --   --  34.5* 35.3*  PLT 303  --   --  319 296  APTT 173*   < > 89* 72* 71*  LABPROT 17.9*  --   --  18.3* 18.6*  INR 1.5*  --   --  1.5* 1.6*  HEPARINUNFRC >1.10*  --   --  0.91* 0.58   < > = values in this interval not displayed.     Estimated Creatinine Clearance: 77.5 mL/min (by C-G formula based on SCr of 0.75 mg/dL).   Assessment: 37 yom with a history of seizures, acute PE, on Eliquis. Patient is presenting with left leg swelling and pain. Venous duplex of left lower extremity showed acute DVT. Heparin per pharmacy consult placed for  VTE treatment .  Patient was on apixaban PTA. Last dose 9/9 per patient, but not completely sure. Chart review revealed that patient has told providers that he could not afford Eliquis and previously stopped taking due to this. It was re-started on most recent admission. Per oncology recommendations, we are now using warfarin.  aPTT is therapeutic at 71 sec on 1250 units/hr; heparin level is 0.58. Given recent Eliquis use, will monitor anticoagulation using aPTT and heparin levels. INR remains subtherapeutic at 1.6 but trending up. No bleeding noted, CBC is stable.  Goal of Therapy:  Heparin level 0.3-0.7 units/ml aPTT 66-102 seconds INR 2-3 Monitor platelets by anticoagulation protocol: Yes   Plan:  Warfarin 10 mg PO x 1 Continue heparin infusion at 1250 units/hr Daily aPTT, heparin level, INR,  CBC Educated on 9/12   Thank you for allowing [place] to participate in this patients care. Jens Som, PharmD 08/29/2021 10:21 AM  **Pharmacist phone directory can be found on [email or web address] listed under [place]**

## 2021-08-29 NOTE — Hospital Course (Addendum)
85 year old man presented with leg swelling, admitted for DVT.  Previously diagnosed with PE 05/2021 treated with apixaban but patient ran out of medication.  States he was unable to afford this.  In the emergency department informal consultation with hematology recommended admission with initiation of heparin or Lovenox and warfarin.  Patient cannot afford co-pay for apixaban or Xarelto.  Continue warfarin.

## 2021-08-30 ENCOUNTER — Other Ambulatory Visit (HOSPITAL_COMMUNITY): Payer: Self-pay

## 2021-08-30 DIAGNOSIS — R338 Other retention of urine: Secondary | ICD-10-CM

## 2021-08-30 DIAGNOSIS — I10 Essential (primary) hypertension: Secondary | ICD-10-CM

## 2021-08-30 LAB — CBC
HCT: 35.7 % — ABNORMAL LOW (ref 39.0–52.0)
Hemoglobin: 11.4 g/dL — ABNORMAL LOW (ref 13.0–17.0)
MCH: 29.1 pg (ref 26.0–34.0)
MCHC: 31.9 g/dL (ref 30.0–36.0)
MCV: 91.1 fL (ref 80.0–100.0)
Platelets: 336 10*3/uL (ref 150–400)
RBC: 3.92 MIL/uL — ABNORMAL LOW (ref 4.22–5.81)
RDW: 13.8 % (ref 11.5–15.5)
WBC: 8.2 10*3/uL (ref 4.0–10.5)
nRBC: 0 % (ref 0.0–0.2)

## 2021-08-30 LAB — HEPARIN LEVEL (UNFRACTIONATED): Heparin Unfractionated: 0.33 IU/mL (ref 0.30–0.70)

## 2021-08-30 LAB — PROTIME-INR
INR: 1.8 — ABNORMAL HIGH (ref 0.8–1.2)
Prothrombin Time: 21.1 seconds — ABNORMAL HIGH (ref 11.4–15.2)

## 2021-08-30 LAB — APTT: aPTT: 100 seconds — ABNORMAL HIGH (ref 24–36)

## 2021-08-30 MED ORDER — APIXABAN (ELIQUIS) VTE STARTER PACK (10MG AND 5MG)
ORAL_TABLET | ORAL | 0 refills | Status: DC
  Filled 2021-08-30: qty 74, 30d supply, fill #0

## 2021-08-30 MED ORDER — ENOXAPARIN (LOVENOX) PATIENT EDUCATION KIT
PACK | Freq: Once | Status: DC
  Filled 2021-08-30: qty 1

## 2021-08-30 MED ORDER — APIXABAN 5 MG PO TABS
10.0000 mg | ORAL_TABLET | Freq: Two times a day (BID) | ORAL | Status: DC
Start: 2021-08-30 — End: 2021-08-30
  Administered 2021-08-30: 10 mg via ORAL
  Filled 2021-08-30: qty 2

## 2021-08-30 MED ORDER — ENOXAPARIN SODIUM 150 MG/ML IJ SOSY
1.5000 mg/kg | PREFILLED_SYRINGE | INTRAMUSCULAR | Status: DC
  Filled 2021-08-30 (×2): qty 0.9

## 2021-08-30 MED ORDER — WARFARIN SODIUM 7.5 MG PO TABS: 7.5000 mg | ORAL_TABLET | Freq: Once | ORAL | Status: DC

## 2021-08-30 MED ORDER — APIXABAN 5 MG PO TABS
5.0000 mg | ORAL_TABLET | Freq: Two times a day (BID) | ORAL | Status: DC
Start: 2021-09-06 — End: 2021-08-30

## 2021-08-30 NOTE — Progress Notes (Signed)
OT TREATMENT  Pt. Was seen by OT to increase I and safety with ADLs and mobility. Pt. Was able to preform adls sitting in recliner at [facility] a level. Pt. Was able to preform with cross leg technique. Pt. Was able to transfer bed to recliner with use of rw and cues for proper hand placement. Acute OT to follow.   08/30/21 1100  OT Visit Information  Last OT Received On 08/30/21  Assistance Needed +1  History of Present Illness Pt is an 85 year old male recently admitted 08/13/21 for status epilepticus with intubation 8/29-8/31, new onset seizure disorder. He had returned home, but is Now readmitted on 08/25/21 for LLE edema after having to discontinue his eloquis due to financial constraints. Per 9/10 LE venous dopplar: Findings consistent with acute deep vein thrombosis involving the left   common femoral vein, SF junction, left femoral vein, left popliteal vein, left posterior tibial veins, and external iliac vein.   PMH: R MCA stroke, PE , HTN, RUL mass (suspect lung CA)  Precautions  Precautions Fall  Pain Assessment  Pain Assessment No/denies pain  Cognition  Arousal/Alertness Awake/alert  Behavior During Therapy WFL for tasks assessed/performed  Overall Cognitive Status Within Functional Limits for tasks assessed  Area of Impairment Awareness;Problem solving;Safety/judgement  Following Commands Follows one step commands consistently  Safety/Judgement Decreased awareness of deficits  Problem Solving Requires verbal cues  Upper Extremity Assessment  Upper Extremity Assessment Generalized weakness  Lower Extremity Assessment  Lower Extremity Assessment Defer to PT evaluation  ADL  Overall ADL's  Needs assistance/impaired  Eating/Feeding Modified independent  Grooming Standing;Min guard  Upper Body Bathing Minimal assistance;Sitting  Lower Body Bathing Minimal assistance;Sit to/from stand  Upper Body Dressing  Minimal assistance;Sitting  Lower Body Dressing Minimal  assistance;Sitting/lateral leans  Toilet Transfer Minimal assistance;Ambulation;RW  Toileting- Clothing Manipulation and Hygiene Minimal assistance;Sit to/from stand  Functional mobility during ADLs Minimal assistance;Rolling walker;Cueing for safety  General ADL Comments Pt. used cross leg technique for le adls.  Bed Mobility  Overal bed mobility Needs Assistance  Bed Mobility Supine to Sit  Supine to sit Supervision  General bed mobility comments cues for proper hand placement.  Balance  Sitting balance-Leahy Scale Good  Standing balance-Leahy Scale Fair  Restrictions  Weight Bearing Restrictions No  Vision- Assessment  Vision Assessment? No apparent visual deficits  Transfers  Overall transfer level Needs assistance  Equipment used Rolling walker (2 wheeled)  Transfers Sit to/from Stand  Sit to Stand Min guard  Stand pivot transfers Min guard  General transfer comment cues for hand placement  OT - End of Session  Equipment Utilized During Treatment Gait belt;Rolling walker  Activity Tolerance Patient tolerated treatment well  Patient left in chair;with call bell/phone within reach;with chair alarm set  Nurse Communication  (ok therapy)  OT Assessment/Plan  OT Plan Discharge plan remains appropriate  OT Visit Diagnosis Unsteadiness on feet (R26.81);Other abnormalities of gait and mobility (R26.89);Other symptoms and signs involving cognitive function;Low vision, both eyes (H54.2)  OT Frequency (ACUTE ONLY) Min 2X/week  Recommendations for Other Services Other (comment)  Follow Up Recommendations Supervision - Intermittent;Home health OT  OT Equipment 3 in 1 bedside commode  AM-PAC OT "6 Clicks" Daily Activity Outcome Measure (Version 2)  Help from another person eating meals? 4  Help from another person taking care of personal grooming? 3  Help from another person toileting, which includes using toliet, bedpan, or urinal? 3  Help from another person bathing (including  washing, rinsing, drying)? 3  Help from another person to put on and taking off regular upper body clothing? 3  Help from another person to put on and taking off regular lower body clothing? 3  6 Click Score 19  Progressive Mobility  What is the highest level of mobility based on the progressive mobility assessment? Level 4 (Walks with assist in room) - Balance while marching in place and cannot step forward and back - Complete  Mobility Ambulated with assistance in room  OT Goal Progression  Progress towards OT goals Progressing toward goals  Acute Rehab OT Goals  Patient Stated Goal get better  OT Goal Formulation With patient  Time For Goal Achievement 09/11/21  Potential to Achieve Goals Good  ADL Goals  Pt Will Perform Grooming with modified independence;standing  Pt Will Perform Lower Body Bathing with min guard assist;sit to/from stand  Pt Will Perform Lower Body Dressing with modified independence;sit to/from stand  Pt Will Transfer to Toilet ambulating;with supervision;regular height toilet;grab bars  Pt Will Perform Toileting - Clothing Manipulation and hygiene with modified independence;sitting/lateral leans;sit to/from stand  Additional ADL Goal #1 Pt will engage in 10 min standing functional activities without loss of balance, with supervision only in order to demonstrate improved activity tolerance and balance needed to perform ADLs safely at home.  OT Time Calculation  OT Start Time (ACUTE ONLY) 1103  OT Stop Time (ACUTE ONLY) 1143  OT Time Calculation (min) 40 min  OT General Charges  $OT Visit 1 Visit  OT Treatments  $Self Care/Home Management  23-37 mins  $Therapeutic Activity 8-22 mins  Reece Packer OT/L

## 2021-08-30 NOTE — Assessment & Plan Note (Signed)
--  Venous duplex of left lower extremity showed acute DVT involving left common femoral vein, SL junction, left femoral vein, left popliteal vein, left posterior tibial vein and external iliac vein.  -- Patient had a starter pack of apixaban and some samples from his PCP but was unable to afford the prescription and probably did not take for about a month.  When he was in the hospital for status epilepticus he was administered apixaban for the duration of his hospitalization. -- Would not consider this a treatment failure given the patient's inability to afford the medication as an outpatient, nevertheless given financial considerations he wishes to continue warfarin. -- Leg edema about the same today, no signs or symptoms of cerulea dolens or complicating features.  Place TED hose.  Outpatient follow-up with vascular. -- Changed to Lovenox bridge until warfarin therapeutic.  Patient reports that neither he nor anyone at home can administer the Lovenox injection.  We will explore options with home health. -- Close coordination with PCP for monitoring of warfarin will be needed

## 2021-08-30 NOTE — Assessment & Plan Note (Signed)
--   Present since last admission, urology recommending continuing Foley catheter and follow-up as an outpatient for voiding trial -- Continue Flomax daily

## 2021-08-30 NOTE — Progress Notes (Signed)
Physical Therapy Treatment Patient Details Name: Travis Salazar. MRN: [id number] DOB: Dec 10, 1936 Today's Date: 08/30/2021   History of Present Illness Pt is an 85 year old male recently admitted 08/13/21 for status epilepticus with intubation 8/29-8/31, new onset seizure disorder. He had returned home, but is Now readmitted on 08/25/21 for LLE edema after having to discontinue his eloquis due to financial constraints. Per 9/10 LE venous dopplar: Findings consistent with acute deep vein thrombosis involving the left   common femoral vein, SF junction, left femoral vein, left popliteal vein, left posterior tibial veins, and external iliac vein.   PMH: R MCA stroke, PE , HTN, RUL mass (suspect lung CA)    PT Comments    Patient reporting he may go home tomorrow. Feels he is mobilizing well enough. Pt demonstrated use of RW with minguard assist as maneuvering RW around obstacles (pt did clear all obstacles without physical assist). Able to stand at sink and perform ADL without holding onto surface and then completed balance exercises with minguard assist for safety.     Recommendations for follow up therapy are one component of a multi-disciplinary discharge planning process, led by the attending physician.  Recommendations may be updated based on patient status, additional functional criteria and insurance authorization.  Follow Up Recommendations  Home health PT;Supervision for mobility/OOB     Equipment Recommendations  None recommended by PT (reports RW and cane at home)    Recommendations for Other Services       Precautions / Restrictions Precautions Precautions: Fall Precaution Comments: . Restrictions Weight Bearing Restrictions: No     Mobility  Bed Mobility                    Transfers Overall transfer level: Needs assistance Equipment used: Rolling walker (2 wheeled) Transfers: Sit to/from Stand Sit to Stand: Min guard         General transfer comment: cues  for hand placement  Ambulation/Gait Ambulation/Gait assistance: Min guard Gait Distance (Feet): 75 Feet Assistive device: Rolling walker (2 wheeled) Gait Pattern/deviations: Step-through pattern;Trunk flexed Gait velocity: decreased   General Gait Details: cues for proximity to RW and avoiding obstacles in room (pt denied due to decr vision however ?if this was the cause)   Stairs             Wheelchair Mobility    Modified Rankin (Stroke Patients Only)       Balance Overall balance assessment: Needs assistance Sitting-balance support: Feet supported;No upper extremity supported Sitting balance-Leahy Scale: Good     Standing balance support: No upper extremity supported;Bilateral upper extremity supported Standing balance-Leahy Scale: Fair Standing balance comment: RW to ambulate but could static stand without AD and perform ADL at the sink                            Cognition Arousal/Alertness: Awake/alert Behavior During Therapy: WFL for tasks assessed/performed Overall Cognitive Status: No family/caregiver present to determine baseline cognitive functioning Area of Impairment: Awareness;Problem solving                           Awareness: Emergent Problem Solving: Requires verbal cues General Comments: Required cues for problem-solving how to maneuver RW in tight spaces in his room      Exercises General Exercises - Lower Extremity Hip Flexion/Marching: AROM;Both;10 reps;Standing (bil UE support at counter) Heel Raises: AROM;Both;15 reps (bil UE support at counter)  General Comments        Pertinent Vitals/Pain Pain Assessment: Faces Faces Pain Scale: Hurts a little bit (states "it's much better") Pain Location: L leg Pain Descriptors / Indicators: Discomfort Pain Intervention(s): Limited activity within patient's tolerance;Repositioned    Home Living                      Prior Function            PT Goals  (current goals can now be found in the care plan section) Acute Rehab PT Goals Patient Stated Goal: get better Time For Goal Achievement: 09/11/21 Potential to Achieve Goals: Good Progress towards PT goals: Progressing toward goals    Frequency    Min 3X/week      PT Plan Current plan remains appropriate    Co-evaluation              AM-PAC PT "6 Clicks" Mobility   Outcome Measure  Help needed turning from your back to your side while in a flat bed without using bedrails?: None Help needed moving from lying on your back to sitting on the side of a flat bed without using bedrails?: None Help needed moving to and from a bed to a chair (including a wheelchair)?: A Little Help needed standing up from a chair using your arms (e.g., wheelchair or bedside chair)?: A Little Help needed to walk in hospital room?: A Little Help needed climbing 3-5 steps with a railing? : A Little 6 Click Score: 20    End of Session Equipment Utilized During Treatment: Gait belt Activity Tolerance: Patient tolerated treatment well Patient left: with call bell/phone within reach;in chair;with chair alarm set Nurse Communication: Mobility status PT Visit Diagnosis: Unsteadiness on feet (R26.81);Other abnormalities of gait and mobility (R26.89);Muscle weakness (generalized) (M62.81)     Time: 1337-1400 PT Time Calculation (min) (ACUTE ONLY): 23 min  Charges:  $Gait Training: 8-22 mins $Therapeutic Exercise: 8-22 mins                      Arby Barrette, PT Pager [phone]    Rexanne Mano 08/30/2021, 3:54 PM

## 2021-08-30 NOTE — TOC Initial Note (Addendum)
Transition of Care (TOC) - Initial/Assessment Note    Patient Details  Name: Travis Salazar. MRN: [id number] Date of Birth: 02-19-1936  [facility]) CM/SW Contact:    Verdell Carmine, RN Phone Number: 08/30/2021, 9:18 AM  Clinical Narrative:                  Readmission for this patient, having trouble paying for his eliquis. Previous PE< now with DVT. Patient is on heparin, changing to coumadin for blood thinning. Eligibility sent for xaralto and eliquis. Previously been set up for [place], . Messaged [facility] to confirm continuation of Home Health services. Patient has a RW and 3:1at home.  CM will follow for needs, recommendations,and transitions.   Confirmed patient is active with [facility] , and they will see him post- hospitalization. 1300: Spoke to patient and then daughter. Patient lives with wife who is wheelchair bound and oxygen, and son who is shitzophrenic. Daughter lives in [place], but is here now to see to the patients needs. She will come up today to educate on lovenox and give him his daily injection. She requests a 3:1 which will be ordered, the patient has a walker at home. Confirmed with [facility] about DC in AM with PT, OT CSW and RN.   Expected Discharge Plan: [place] Barriers to Discharge: Continued Medical Work up   Patient Goals and CMS Choice     Choice offered to / list presented to : Patient  Expected Discharge Plan and Services Expected Discharge Plan: [place]   Discharge Planning Services: CM Consult Post Acute Care Choice: Durable Medical Equipment, Home Health                                        Prior Living Arrangements/Services     Patient language and need for interpreter reviewed:: Yes Do you feel safe going back to the place where you live?: Yes      Need for Family Participation in Patient Care: Yes (Comment) Care giver support system in place?: Yes (comment)    Criminal Activity/Legal Involvement Pertinent to Current Situation/Hospitalization: No - Comment as needed  Activities of Daily Living Home Assistive Devices/Equipment: None ADL Screening (condition at time of admission) Patient's cognitive ability adequate to safely complete daily activities?: Yes Is the patient deaf or have difficulty hearing?: No Does the patient have difficulty seeing, even when wearing glasses/contacts?: Yes Does the patient have difficulty concentrating, remembering, or making decisions?: No Patient able to express need for assistance with ADLs?: No Does the patient have difficulty dressing or bathing?: No Independently performs ADLs?: No Bathing: Needs assistance Toileting: Needs assistance Does the patient have difficulty walking or climbing stairs?: Yes Weakness of Legs: Both Weakness of Arms/Hands: None  Permission Sought/Granted                  Emotional Assessment       Orientation: : Oriented to Self, Oriented to Place, Oriented to  Time, Oriented to Situation Alcohol / Substance Use: Not Applicable Psych Involvement: No (comment)  Admission diagnosis:  DVT (deep venous thrombosis) (HCC) [I82.409] Acute deep vein thrombosis (DVT) of proximal vein of left lower extremity (HCC) [I82.4Y2] Patient Active Problem List   Diagnosis Date Noted   Seizure disorder ([place]) 08/29/2021   DVT (deep venous thrombosis) ([place]) 08/25/2021   Acute urinary  retention 08/18/2021   Status epilepticus ([place]) 08/13/2021   Acute pulmonary embolism without acute cor pulmonale ([place]) 05/27/2021   Demand ischemia ([place]) 05/27/2021   CAD (coronary artery disease) 05/27/2021   Emphysema lung ([place]) 05/27/2021   Acute left hemiparesis ([place]) 04/30/2020   Paroxysmal supraventricular tachycardia ([place]) 04/30/2020   Right lower lobe lung mass 01/07/2020   CVA (cerebral vascular accident) ([place]) 06/26/2019   Wrist swelling, right 06/03/2017   S/P prostatectomy 10/08/2016    Snores 10/08/2016   Essential (primary) hypertension 09/27/2015   History of tobacco use 09/27/2015   Hypercholesterolemia 09/27/2015   Blood glucose elevated 09/27/2015   Adiposity 09/27/2015   PCP:  [name], MD Pharmacy:   [facility] [address] [place] [address] Phone: [phone] Fax: [phone]  [id number] [place] [id number]: [phone]: [phone]    Social Determinants of Health (SDOH) Interventions    Readmission Risk Interventions Readmission Risk Prevention Plan 08/23/2021 08/22/2021  Post Dischage Appt Complete -  Medication Screening Complete -  Transportation Screening Complete Complete  PCP or Specialist Appt within 5-7 Days - Complete  Home Care Screening - Complete  Medication Review (RN CM) - Complete  Some recent data might be hidden

## 2021-08-30 NOTE — Progress Notes (Signed)
ANTICOAGULATION CONSULT NOTE   Pharmacy Consult to transition anticoagulation to Apixaban Indication:  VTE treatment  No Known Allergies  Patient Measurements: Height: '5\' 8"'$  (172.7 cm) Weight: 89.8 kg (197 lb 15.6 oz) IBW/kg (Calculated) : 68.4  Vital Signs: Temp: 98.1 F (36.7 C) (09/15 0802) Temp Source: Axillary (09/15 0802) BP: 128/71 (09/15 1055) Pulse Rate: 74 (09/15 1055)  Labs: Recent Labs    08/28/21 0205 08/29/21 0050 08/30/21 0102  HGB 11.3* 11.4* 11.4*  HCT 34.5* 35.3* 35.7*  PLT 319 296 336  APTT 72* 71* 100*  LABPROT 18.3* 18.6* 21.1*  INR 1.5* 1.6* 1.8*  HEPARINUNFRC 0.91* 0.58 0.33     Estimated Creatinine Clearance: 77.5 mL/min (by C-G formula based on SCr of 0.75 mg/dL).   Assessment: 89 yom with a history acute PE, on Eliquis. Patient is presenting with left leg swelling and pain. Venous duplex of LLE with acute DVT. Heparin per pharmacy consult placed for  VTE treatment .  Patient was on apixaban PTA. Chart review revealed that patient has told providers that he could not afford Eliquis and previously stopped taking due to this. It was re-started on last admission. Per Heme/Onc recommendations, we changed to warfarin with heparin bridge.   Team has discussed with daughter and the $47 copay for apixaban will not be an issue for her. Pharmacy has now been consulted to transition back to apixaban. No bleeding noted, CBC is stable.  Goal of Therapy:  Anticoagulation  Monitor platelets by anticoagulation protocol: Yes   Plan:  Discontinue heparin infusion Discontinue warfarin Start apixaban 10 mg PO BID x 7 days, followed by 5 mg PO BID thereafter Monitor for signs and symptoms of bleeding and renal function   Thank you for allowing [place] to participate in this patients care. Jens Som, PharmD 08/30/2021 12:24 PM  **Pharmacist phone directory can be found on [email or web address] listed under [place]**

## 2021-08-30 NOTE — Progress Notes (Signed)
PROGRESS NOTE  Travis Salazar. BQ:[id number] DOB: 03-13-1936 DOA: 08/25/2021 PCP: [name], MD  Brief History   85 year old man presented with leg swelling, admitted for DVT.  Previously diagnosed with PE 05/2021 treated with apixaban but patient ran out of medication.  States he was unable to afford this.  In the emergency department informal consultation with hematology recommended admission with initiation of heparin or Lovenox and warfarin.  Patient cannot afford co-pay for apixaban or Xarelto.  Continue warfarin.    A & P  DVT (deep venous thrombosis) (HCC) --Venous duplex of left lower extremity showed acute DVT involving left common femoral vein, SL junction, left femoral vein, left popliteal vein, left posterior tibial vein and external iliac vein.  -- Patient had a starter pack of apixaban and some samples from his PCP but was unable to afford the prescription and probably did not take for about a month.  When he was in the hospital for status epilepticus he was administered apixaban for the duration of his hospitalization. -- Would not consider this a treatment failure given the patient's inability to afford the medication as an outpatient, nevertheless given financial considerations he wishes to continue warfarin. -- Leg edema about the same today, no signs or symptoms of cerulea dolens or complicating features.  Place TED hose.  Outpatient follow-up with vascular. -- Changed to Lovenox bridge until warfarin therapeutic.  Patient reports that neither he nor anyone at home can administer the Lovenox injection.  We will explore options with home health. -- Close coordination with PCP for monitoring of warfarin will be needed  Right lower lobe lung mass -- Seen initially in July 2020, then again in January [id number] hypermetabolic on PET scan, referred to cardiothoracic surgery in the past for resection and oncology for radiation but patient declined these referrals.  Further  work-up recommended patient has declined.  Recommend outpatient follow-up with PCP.  Acute urinary retention -- Present since last admission, urology recommending continuing Foley catheter and follow-up as an outpatient for voiding trial -- Continue Flomax daily  Seizure disorder ([place]) -- Stable.  No seizures.  Continue Keppra.  Usual seizure precautions and limitations.  Essential (primary) hypertension -- Stable.  Continue amlodipine, metoprolol  Disposition Plan:  Discussion: We will coordinate close outpatient follow-up with TOC and PCP as above, will need warfarin monitoring, hopefully can discharge home with Lovenox bridge although this may require home health  Status is: Inpatient  Remains inpatient appropriate because:IV treatments appropriate due to intensity of illness or inability to take PO and Inpatient level of care appropriate due to severity of illness  Dispo: The patient is from: Home              Anticipated d/c is to: Home              Patient currently is not medically stable to d/c.   Difficult to place patient No  DVT prophylaxis: enoxaparin, warfarin warfarin (COUMADIN) tablet 7.5 mg   Code Status: Full Code Level of care: Med-Surg Family Communication:   Murray Hodgkins, MD  [facility] Direct contact: see [email or web address] (further directions at bottom of note if needed) 7PM-7AM contact night coverage as at bottom of note 08/30/2021, 12:18 PM  LOS: 5 days   Significant Hospital Events   9/10 admit for LLE DVT   Consults:  None    Procedures:  None   Significant Diagnostic Tests:  LEFT LE:  - Findings consistent with acute deep vein thrombosis involving  the left common femoral vein, SF junction, left femoral vein, left popliteal vein, left posterior tibial veins, and external iliac vein.    Interval History/Subjective  CC: f/u DVT  Feels fine Left leg still swollen but better since admission Breathing fine Cannot afford $45/month for  apixaban Cannot admin a shot, nor can anyone at home  Objective   Vitals:  Vitals:   08/30/21 0802 08/30/21 1055  BP: 118/64 128/71  Pulse: 73 74  Resp: 20 14  Temp: 98.1 F (36.7 C)   SpO2: 97%     Exam: Physical Exam Constitutional:      General: He is not in acute distress.    Comments: Sitting in chair  Cardiovascular:     Rate and Rhythm: Normal rate and regular rhythm.     Pulses: Normal pulses.     Heart sounds: No murmur heard. Pulmonary:     Effort: Pulmonary effort is normal. No respiratory distress.     Breath sounds: Normal breath sounds. No wheezing, rhonchi or rales.  Musculoskeletal:     Right lower leg: No edema.     Left lower leg: Edema (perfusion grossly intact) present.  Skin:    General: Skin is warm.  Neurological:     Mental Status: He is alert.  Psychiatric:        Mood and Affect: Mood normal.        Behavior: Behavior normal.   I have personally reviewed the labs and other data, making special note of:   Today's Data  CBC stable INR 1.8  Scheduled Meds:  amLODipine  10 mg Oral Daily   atorvastatin  40 mg Oral q1800   bethanechol  10 mg Oral TID   Chlorhexidine Gluconate Cloth  6 each Topical Daily   levETIRAcetam  1,000 mg Oral BID   metoprolol tartrate  12.5 mg Oral BID   tamsulosin  0.4 mg Oral Daily   warfarin  7.5 mg Oral ONCE-1600   Warfarin - Pharmacist Dosing Inpatient   Does not apply q1600   Continuous Infusions:  sodium chloride 10 mL/hr at 08/27/21 1800   heparin 1,250 Units/hr (08/30/21 0348)    Principal Problem:   DVT (deep venous thrombosis) ([place]) Active Problems:   Right lower lobe lung mass   Seizure disorder ([place])   Essential (primary) hypertension   Acute urinary retention   LOS: 5 days   How to contact the [facility] Attending or Consulting provider 7A - 7P or covering provider during after hours [place], for this patient?  Check the care team in [facility] and look for a) attending/consulting TRH provider listed  and b) the [facility] team listed Log into [email or web address] and use [place] universal password to access. If you do not have the password, please contact the hospital operator. Locate the [facility] provider you are looking for under [facility] and page to a number that you can be directly reached. If you still have difficulty reaching the provider, please page the [facility] (Director on Call) for the Hospitalists listed on amion for assistance.

## 2021-08-30 NOTE — Assessment & Plan Note (Signed)
--   Stable.  No seizures.  Continue Keppra.  Usual seizure precautions and limitations.

## 2021-08-30 NOTE — TOC Benefit Eligibility Note (Signed)
Transition of Care [facility]) Benefit Eligibility Note    Patient Details  Name: Travis Salazar. MRN: [id number] Date of Birth: [birth year]   Medication/Dose: ENOXAPARIN 100 MG DAILY SYRINGES  Covered?: Yes  Tier:  (TIER-4 DRUG)  Prescription Coverage Preferred Pharmacy: [facility]  Spoke with Person/Company/Phone Number:: KJIZX  @  [place] [employer] # [phone]  Co-Pay: $ 100.00  Prior Approval: No  Deductible: Met (OUT-OF-POCKET :UNMET)  Additional Notes: LOVENOX  100 MG DAILY SYRINGES : NOT COVER / NON-FORMULARY  , P/A -YES # [phone]    Memory Argue Phone Number: 08/30/2021, 1:34 PM

## 2021-08-30 NOTE — Progress Notes (Signed)
ANTICOAGULATION CONSULT NOTE   Pharmacy Consult for Heparin & Warfarin Indication:  VTE treatment  No Known Allergies  Patient Measurements: Height: '5\' 8"'$  (172.7 cm) Weight: 89.8 kg (197 lb 15.6 oz) IBW/kg (Calculated) : 68.4 Heparin Dosing Weight: 85.9 kg  Vital Signs: Temp: 98.1 F (36.7 C) (09/15 0802) Temp Source: Axillary (09/15 0802) BP: 118/64 (09/15 0802) Pulse Rate: 73 (09/15 0802)  Labs: Recent Labs    08/28/21 0205 08/29/21 0050 08/30/21 0102  HGB 11.3* 11.4* 11.4*  HCT 34.5* 35.3* 35.7*  PLT 319 296 336  APTT 72* 71* 100*  LABPROT 18.3* 18.6* 21.1*  INR 1.5* 1.6* 1.8*  HEPARINUNFRC 0.91* 0.58 0.33     Estimated Creatinine Clearance: 77.5 mL/min (by C-G formula based on SCr of 0.75 mg/dL).   Assessment: 44 yom with a history acute PE, on Eliquis. Patient is presenting with left leg swelling and pain. Venous duplex of LLE with acute DVT. Heparin per pharmacy consult placed for  VTE treatment .  Patient was on apixaban PTA. Chart review revealed that patient has told providers that he could not afford Eliquis and previously stopped taking due to this. It was re-started on most recent admission. Per Heme/Onc recommendations, we are now using warfarin.  Heparin level and aPTT are therapeutic at 0.33 and100 sec on 1250 units/hr. INR remains subtherapeutic at 1.8 but trending up. No bleeding noted, CBC is stable.  Goal of Therapy:  Heparin level 0.3-0.7 units/ml INR 2-3 Monitor platelets by anticoagulation protocol: Yes   Plan:  Warfarin 7.5 mg PO x 1 Continue heparin infusion at 1250 units/hr Daily heparin level, INR, CBC Educated on 9/12   Thank you for allowing [place] to participate in this patients care. Jens Som, PharmD 08/30/2021 8:37 AM  **Pharmacist phone directory can be found on [email or web address] listed under [employer]**

## 2021-08-30 NOTE — Progress Notes (Addendum)
Nursing Discharge Note   Admit Date: 08/25/2021    Discharge date: 08/30/2021   Mr. [name] is to be discharged home per MD order.  AVS completed. Reviewed with patient and daughter Tammy  at bedside. Highlighted copy provided for patient to take home.  Patient/caregiver able to verbalize understanding of discharge instructions. PIV removed. Patient stable upon discharge.   Discharged home with Eliquis Starter Pack that was delivered by [facility] tech earlier in the day. Reviewed multiple times with Tammy- daughter and Mr. Nicolaou the dosage and that it would change to one tablet twice daily on 09/06/2021. Patient and daughter-Tammy verbalized understanding.    Discharge Instructions      Information on my medicine - ELIQUIS (apixaban)  This medication education was reviewed with me or my healthcare representative as part of my discharge preparation.   Why was Eliquis prescribed for you? Eliquis was prescribed to treat blood clots that may have been found in the veins of your legs (deep vein thrombosis) or in your lungs (pulmonary embolism) and to reduce the risk of them occurring again.  What do You need to know about Eliquis ? The starting dose is 10 mg (two 5 mg tablets) taken TWICE daily for the FIRST SEVEN (7) DAYS, then on 09/06/21  the dose is reduced to ONE 5 mg tablet taken TWICE daily.  Eliquis may be taken with or without food.   Try to take the dose about the same time in the morning and in the evening. If you have difficulty swallowing the tablet whole please discuss with your pharmacist how to take the medication safely.  Take Eliquis exactly as prescribed and DO NOT stop taking Eliquis without talking to the doctor who prescribed the medication.  Stopping may increase your risk of developing a new blood clot.  Refill your prescription before you run out.  After discharge, you should have regular check-up appointments with your healthcare  provider that is prescribing your Eliquis.    What do you do if you miss a dose? If a dose of ELIQUIS is not taken at the scheduled time, take it as soon as possible on the same day and twice-daily administration should be resumed. The dose should not be doubled to make up for a missed dose.  Important Safety Information A possible side effect of Eliquis is bleeding. You should call your healthcare provider right away if you experience any of the following: Bleeding from an injury or your nose that does not stop. Unusual colored urine (red or dark brown) or unusual colored stools (red or black). Unusual bruising for unknown reasons. A serious fall or if you hit your head (even if there is no bleeding).  Some medicines may interact with Eliquis and might increase your risk of bleeding or clotting while on Eliquis. To help avoid this, consult your healthcare provider or pharmacist prior to using any new prescription or non-prescription medications, including herbals, vitamins, non-steroidal anti-inflammatory drugs (NSAIDs) and supplements.  This website has more information on Eliquis (apixaban): [email or web address]       Allergies as of 08/30/2021   No Known Allergies      Medication List     STOP taking these medications    Eliquis 5 MG Tabs tablet Generic drug: apixaban Replaced by: Eliquis DVT/PE Starter Pack       TAKE these medications    amLODipine 10 MG tablet Commonly known as: NORVASC TAKE 1 TABLET BY MOUTH EVERY DAY  atorvastatin 40 MG tablet Commonly known as: LIPITOR TAKE 1 TABLET (40 MG TOTAL) BY MOUTH DAILY AT 6 PM.   bethanechol 10 MG tablet Commonly known as: URECHOLINE Take 1 tablet (10 mg total) by mouth 3 (three) times daily.   colchicine 0.6 MG tablet Take 1 tablet (0.6 mg total) by mouth as needed (gout).   Eliquis DVT/PE Starter Pack Generic drug: Apixaban Starter Pack ('10mg'$  and '5mg'$ ) Take as directed on package: start  with two-'5mg'$  tablets twice daily for 7 days. On day 8, switch to one-'5mg'$  tablet twice daily. Replaces: Eliquis 5 MG Tabs tablet   hydrALAZINE 100 MG tablet Commonly known as: APRESOLINE Take 100 mg by mouth 2 (two) times daily.   levETIRAcetam 1000 MG tablet Commonly known as: KEPPRA Take 1 tablet (1,000 mg total) by mouth 2 (two) times daily.   metoprolol tartrate 25 MG tablet Commonly known as: LOPRESSOR Take one-half tablet (12.5 mg total) by mouth 2 (two) times daily.   tamsulosin 0.4 MG Caps capsule Commonly known as: FLOMAX Take 1 capsule (0.4 mg total) by mouth daily.               Durable Medical Equipment  (From admission, onward)           Start     Ordered   08/30/21 1425  For home use only DME 3 n 1  Once        08/30/21 1425   08/30/21 1322  For home use only DME 3 n 1  Once        08/30/21 1321             Discharge Instructions/ Education: Discharge instructions given to patient/family with verbalized understanding. Discharge education completed with patient/family including: follow up instructions, medication list, discharge activities, and limitations if indicated. Additional discharge instructions as indicated by discharging provider also reviewed. Patient and family able to verbalize understanding, all questions fully answered. Patient instructed to return to Emergency Department, call 911, or call MD for any changes in condition.  Patient escorted via wheelchair to lobby and discharged home via private automobile.

## 2021-08-30 NOTE — Discharge Summary (Signed)
Physician Discharge Summary  Travis Salazar. [id number] DOB: 1936-03-01 DOA: 08/25/2021  PCP: [name], MD  Admit date: 08/25/2021 Discharge date: 08/30/2021  Recommendations for Outpatient Follow-up:  * DVT (deep venous thrombosis) (HCC) --Venous duplex of left lower extremity showed acute DVT involving left common femoral vein, SL junction, left femoral vein, left popliteal vein, left posterior tibial vein and external iliac vein.  -- Home on apixaban, see discussion below  Right lower lobe lung mass -- Seen initially in July 2020, then again in January [id number] hypermetabolic on PET scan, referred to cardiothoracic surgery in the past for resection and oncology for radiation but patient declined these referrals.  Further work-up recommended patient has declined.   --daugther wishes to proceed with referral, will refer as above  Acute urinary retention -- Present since last admission, urology recommending continuing Foley catheter and follow-up as an outpatient for voiding trial -- Continue Flomax daily   Follow-up Information     [name], MD. Schedule an appointment as soon as possible for a visit in 1 week(s).   Specialty: Family Medicine Contact information: [address] [place] [address] [phone]         [facility] Follow up.   Specialty: Pulmonology Why: office will call you with appointment Contact information: [place] SSN-[id number] [facility] Follow up.   Why: Office will call you with appointment        [facility] Follow up.   Why: Vein and vascular surgery will call you with an appointment        [facility] Follow up.          [facility] Follow up.   Why: For medical equipment Contact information: [address]  [address] [phone]                   Discharge Diagnoses: Principal diagnosis is #1 Principal Problem:   DVT (deep venous thrombosis) ([place]) Active Problems:   Right lower lobe lung mass   Seizure disorder ([place])   Essential (primary) hypertension   Acute urinary retention   Discharge Condition: improved Disposition: home  Diet recommendation:  Diet Orders (From admission, onward)     Start     Ordered   08/30/21 0000  Diet - low sodium heart healthy        08/30/21 1422   08/27/21 1216  Diet Heart Room service appropriate? No; Fluid consistency: Thin  Diet effective now       Question Answer Comment  Room service appropriate? No   Fluid consistency: Thin      08/27/21 1215             Filed Weights   08/25/21 2150 08/26/21 1438 08/27/21 0328  Weight: 86.7 kg 88.7 kg 89.8 kg    HPI/Hospital Course:   85 year old man presented with leg swelling, admitted for DVT.  Previously diagnosed with PE 05/2021 treated with apixaban but patient ran out of medication.  States he was unable to afford this.  Hospitalized earlier this month for status epilepticus requiring intubation.   In the emergency department informal consultation with hematology recommended admission with initiation of heparin or Lovenox and warfarin.  However after extensive history from the patient, chart review and discussion in depth with daughter, as well as telephone discussion with Dr.  Burr Medico, this would not be considered a treatment failure of apixaban.  Patient has known lung mass concerning for malignancy and previous VTE in June of this year.  Clearly hypercoagulable, complicated by recent hospitalization.  After discussion with hematology, will restart apixaban 10 mg twice daily for a week followed by 5 mg twice daily.  Discussed with treatment team as well as patient's PCP.  Given previous compliance issues, I would favor apixaban over warfarin as a safer option and more likely to be efficacious in this patient.  Discussed  in detail with his daughter by telephone, she reports apixaban is affordable at $47 a month and this should not be an issue.  We discussed obtaining support hose for his left lower extremity.  Elevating the left lower extremity.  Outpatient referral to vascular (prefers [facility]) for follow-up given extensive clot, as well as outpatient referral to hematology oncology for further evaluation of lung mass and further comment on long-term anticoagulation.  Also will refer to pulmonology in hopes patient will pursue further evaluation of lung mass.  * DVT (deep venous thrombosis) (HCC) --Venous duplex of left lower extremity showed acute DVT involving left common femoral vein, SL junction, left femoral vein, left popliteal vein, left posterior tibial vein and external iliac vein.  -- Patient had a starter pack of apixaban and some samples from his PCP but was unable to afford the prescription and probably did not take for about a month.  When he was in the hospital for status epilepticus he was administered apixaban for the duration of his hospitalization. -- Leg edema about the same today, no signs or symptoms of cerulea dolens or complicating features.  Place TED hose.  Outpatient follow-up with vascular. -- Home on apixaban  Seizure disorder ([place]) -- Stable.  No seizures.  Continue Keppra.  Usual seizure precautions and limitations.  Right lower lobe lung mass -- Seen initially in July 2020, then again in January [id number] hypermetabolic on PET scan, referred to cardiothoracic surgery in the past for resection and oncology for radiation but patient declined these referrals.  Further work-up recommended patient has declined.   --daugther wishes to proceed with referral, will refer as above  Acute urinary retention -- Present since last admission, urology recommending continuing Foley catheter and follow-up as an outpatient for voiding trial -- Continue Flomax daily  Essential (primary) hypertension --  Stable.  Continue amlodipine, metoprolol    Significant Hospital Events   9/10 admit for LLE DVT   Consults:  None    Procedures:  None    Significant Diagnostic Tests:  LEFT LE:  - Findings consistent with acute deep vein thrombosis involving the left common femoral vein, SF junction, left femoral vein, left popliteal vein, left posterior tibial veins, and external iliac vein.   Today's assessment: See progress note same day  Discharge Instructions  Discharge Instructions     Ambulatory referral to Hematology / Oncology   Complete by: As directed    Ambulatory referral to Pulmonology   Complete by: As directed    Reason for referral: Lung Mass/Lung Nodule   Ambulatory referral to Vascular Surgery   Complete by: As directed    Follow-up extensive LLE DVT   Diet - low sodium heart healthy   Complete by: As directed    Discharge instructions   Complete by: As directed    Call your physician or seek immediate medical attention for swelling, pain, shortness of breath, passing out, chest pain or worsening of condition.  Increase activity slowly   Complete by: As directed       Allergies as of 08/30/2021   No Known Allergies      Medication List     STOP taking these medications    Eliquis 5 MG Tabs tablet Generic drug: apixaban Replaced by: Apixaban Starter Pack ('10mg'$  and '5mg'$ )       TAKE these medications    amLODipine 10 MG tablet Commonly known as: NORVASC TAKE 1 TABLET BY MOUTH EVERY DAY   Apixaban Starter Pack ('10mg'$  and '5mg'$ ) Commonly known as: ELIQUIS STARTER PACK Take as directed on package: start with two-'5mg'$  tablets twice daily for 7 days. On day 8, switch to one-'5mg'$  tablet twice daily. Replaces: Eliquis 5 MG Tabs tablet   atorvastatin 40 MG tablet Commonly known as: LIPITOR TAKE 1 TABLET (40 MG TOTAL) BY MOUTH DAILY AT 6 PM.   bethanechol 10 MG tablet Commonly known as: URECHOLINE Take 1 tablet (10 mg total) by mouth 3 (three) times  daily.   colchicine 0.6 MG tablet Take 1 tablet (0.6 mg total) by mouth as needed (gout).   hydrALAZINE 100 MG tablet Commonly known as: APRESOLINE Take 100 mg by mouth 2 (two) times daily.   levETIRAcetam 1000 MG tablet Commonly known as: KEPPRA Take 1 tablet (1,000 mg total) by mouth 2 (two) times daily.   metoprolol tartrate 25 MG tablet Commonly known as: LOPRESSOR Take one-half tablet (12.5 mg total) by mouth 2 (two) times daily.   tamsulosin 0.4 MG Caps capsule Commonly known as: FLOMAX Take 1 capsule (0.4 mg total) by mouth daily.               Durable Medical Equipment  (From admission, onward)           Start     Ordered   08/30/21 1425  For home use only DME 3 n 1  Once        08/30/21 1425   08/30/21 1322  For home use only DME 3 n 1  Once        08/30/21 1321           No Known Allergies  The results of significant diagnostics from this hospitalization (including imaging, microbiology, ancillary and laboratory) are listed below for reference.    Significant Diagnostic Studies: DG Chest 2 View  Result Date: 08/25/2021 CLINICAL DATA:  Infection, possible DVT EXAM: CHEST - 2 VIEW COMPARISON:  08/13/2021 FINDINGS: Lungs are clear.  No pleural effusion or pneumothorax. The heart is normal in size.  Thoracic aortic atherosclerosis. Mild degenerative changes of the visualized thoracolumbar spine. IMPRESSION: Normal chest radiographs. Electronically Signed   By: Julian Hy M.D.   On: 08/25/2021 20:49   MR BRAIN WO CONTRAST  Result Date: 08/22/2021 CLINICAL DATA:  Neuro deficit, acute, stroke suspected visual deficits EXAM: MRI HEAD WITHOUT CONTRAST TECHNIQUE: Multiplanar, multiecho pulse sequences of the brain and surrounding structures were obtained without intravenous contrast. COMPARISON:  08/14/2021 FINDINGS: Brain: There is no acute infarction or intracranial hemorrhage. There is no intracranial mass, mass effect, or edema. There is no  hydrocephalus or extra-axial fluid collection. Chronic right MCA territorial infarction is identified with associated volume loss and chronic blood products. Prominence of the ventricles and sulci reflects superimposed parenchymal volume loss. Vascular: Major vessel flow voids at the skull base are preserved. Skull and upper cervical spine: Normal marrow signal is preserved. Sinuses/Orbits: Mild mucosal thickening.  Orbits are unremarkable. Other: Sella is unremarkable.  Mastoid air  cells are clear. IMPRESSION: No acute infarction or significant change since 08/14/2021. Electronically Signed   By: Macy Mis M.D.   On: 08/22/2021 20:49   MR BRAIN WO CONTRAST  Result Date: 08/14/2021 CLINICAL DATA:  Seizure EXAM: MRI HEAD WITHOUT CONTRAST TECHNIQUE: Multiplanar, multiecho pulse sequences of the brain and surrounding structures were obtained without intravenous contrast. COMPARISON:  None. FINDINGS: Brain: No acute infarct, mass effect or extra-axial collection. Right MCA territory encephalomalacia with chronic blood products. Normal white matter signal, parenchymal volume and CSF spaces. The midline structures are normal. Vascular: Major flow voids are preserved. Skull and upper cervical spine: Normal calvarium and skull base. Visualized upper cervical spine and soft tissues are normal. Sinuses/Orbits:No paranasal sinus fluid levels or advanced mucosal thickening. No mastoid or middle ear effusion. Normal orbits. IMPRESSION: 1. No acute intracranial abnormality. 2. Right MCA territory encephalomalacia with chronic blood products. Electronically Signed   By: Ulyses Jarred M.D.   On: 08/14/2021 03:26   DG CHEST PORT 1 VIEW  Result Date: 08/13/2021 CLINICAL DATA:  Endotracheal tube placement EXAM: PORTABLE CHEST 1 VIEW COMPARISON:  08/13/2021 FINDINGS: Tip of the endotracheal tube is at the level of the clavicular heads. Esophageal catheter is coiled within the stomach. Bibasilar opacities, likely mild  interstitial pulmonary edema. Poorly visualized right upper lobe mass. IMPRESSION: Tip of endotracheal tube at the level of the clavicular heads. Esophageal catheter coiled within the stomach. Electronically Signed   By: Ulyses Jarred M.D.   On: 08/13/2021 19:37   DG CHEST PORT 1 VIEW  Result Date: 08/13/2021 CLINICAL DATA:  Acute respiratory failure with hypoxia. Code stroke. EXAM: PORTABLE CHEST 1 VIEW COMPARISON:  Chest x-ray 05/27/2021, CT chest 05/27/2021 FINDINGS: The heart and mediastinal contours are unchanged. Aortic calcification. Persistent elevation of left hemidiaphragm. Bibasilar streaky airspace opacities likely represent atelectasis. Redemonstration of a known right upper lobe pulmonary mass (measuring at least 2.6 cm) that is poorly evaluated on this radiograph. No focal consolidation. No pulmonary edema. No pleural effusion. No pneumothorax. No acute osseous abnormality. IMPRESSION: 1. No acute cardiopulmonary abnormality in a patient with known right upper lobe pulmonary mass. As mentioned on CT angiography 05/27/2021: Additional imaging evaluation or consultation with Pulmonology or Thoracic Surgery recommended if not already obtained. 2.  Aortic Atherosclerosis (ICD10-I70.0). Electronically Signed   By: Iven Finn M.D.   On: 08/13/2021 18:34   EEG adult  Result Date: 08/13/2021 Salazar Havens, MD     08/13/2021  8:28 PM Patient Name: Travis Salazar. MRN: [id number] Epilepsy Attending: [name] Referring Physician/Provider: Clance Boll, NP Date: 08/13/2021 Duration: 23.15 mins Patient history: 85 yo male who arrived as a code stroke but was actively seizing. EEG to evaluate for status epilepticus. Level of alertness:  lethargic AEDs during EEG study: Ativan Technical aspects: This EEG study was done with scalp electrodes positioned according to the 10-20 International system of electrode placement. Electrical activity was acquired at a sampling rate of '500Hz'$  and  reviewed with a high frequency filter of '70Hz'$  and a low frequency filter of '1Hz'$ . EEG data were recorded continuously and digitally stored. Description: EEG showed continuous generalized and lateralized right hemisphere 3 to 6 Hz theta-delta slowing. Admixed with 15-'18hz'$  generalized beta activity.Lateralized periodic discharges were noted in right hemisphere, maximal right temporal region with fluctuating frequency between 1-1.'5hz'$ .  Hyperventilation and photic stimulation were not performed.   ABNORMALITY - Lateralized periodic discharges, right hemisphere, maximal right temporal region - Continuous slow, generalized and lateralized right  hemisphere IMPRESSION: This study howed evidence of epileptogenicity and cortical dysfunction arising from right hemisphere, maximal right temporal region likely secondary to underlying structural abnormality, seizure. Of note, lateralized periodic discharges are on the ictal-interictal continuum with increased potential for seizure recurrence. There is also moderate to severe diffuse encephalopathy, nonspecific etiology but likely related to seizure. No seizures were seen throughout the recording. Priyanka Barbra Sarks   Overnight EEG with video  Result Date: 08/14/2021 Salazar Havens, MD     08/15/2021  8:53 AM Patient Name: Matheu Cagnina. MRN: [id number] Epilepsy Attending: [name] Referring Physician/Provider: Clance Boll, NP Duration: 08/13/2021 1750 to 08/14/2021 1750  Patient history: 85 yo male who arrived as a code stroke but was actively seizing. EEG to evaluate for status epilepticus.  Level of alertness:  lethargic  AEDs during EEG study: PHT, Propofol  Technical aspects: This EEG study was done with scalp electrodes positioned according to the 10-20 International system of electrode placement. Electrical activity was acquired at a sampling rate of '500Hz'$  and reviewed with a high frequency filter of '70Hz'$  and a low frequency filter of '1Hz'$ . EEG data were  recorded continuously and digitally stored.  Description: EEG showed continuous generalized 3 to 6 Hz theta-delta slowing admixed with 15-'18hz'$  generalized beta activity. At the beginning of study, lateralized periodic discharges (LPDs) were noted in right hemisphere, maximal right temporal region with fluctuating frequency between 1-1.'5hz'$ . Propofol was started at around 1820 on 08/13/2021 after which LPDs were not seen.  On 08/14/2021 at 0900 propofol was stopped after which EEG again showed right LPDs at '1Hz'$ . Hyperventilation and photic stimulation were not performed.    ABNORMALITY - Lateralized periodic discharges, right hemisphere, maximal right temporal region - Continuous slow, generalized IMPRESSION: This study initially showed evidence of epileptogenicity and cortical dysfunction arising from right hemisphere, maximal right temporal region likely secondary to underlying structural abnormality, seizure.  There was also moderate to severe diffuse encephalopathy, nonspecific etiology but likely related to sedation. No seizures were seen throughout the recording.  Salazar Havens   CT HEAD CODE STROKE WO CONTRAST  Result Date: 08/13/2021 CLINICAL DATA:  Code stroke.  Neuro deficit, acute, stroke suspected EXAM: CT HEAD WITHOUT CONTRAST TECHNIQUE: Contiguous axial images were obtained from the base of the skull through the vertex without intravenous contrast. COMPARISON:  CT head May 27, 2021 FINDINGS: Brain: No evidence of acute large vascular territory infarction, hemorrhage, hydrocephalus, extra-axial collection or mass lesion/mass effect. Similar remote right MCA territory infarcts with encephalomalacia. Mild atrophy. Vascular: No hyperdense vessel identified. Calcific intracranial atherosclerosis. Skull: No acute fracture. Sinuses/Orbits: Right maxillary sinus retention cyst. Mild paranasal sinus mucosal thickening. No acute orbital findings. Other: No mastoid effusions. ASPECTS [facility] Stroke Program  Early CT Score) Total score (0-10 with 10 being normal): 10. IMPRESSION: 1. No evidence of acute large vascular territory infarct or acute hemorrhage. ASPECTS is 10. 2. Large remote right MCA territory infarcts. Code stroke imaging results were communicated on 08/13/2021 at 4:31 pm to provider Dr. Quinn Axe Via secure text paging. Electronically Signed   By: Margaretha Sheffield M.D.   On: 08/13/2021 16:33   VAS [place] LOWER EXTREMITY VENOUS (DVT) (ONLY [facility] & [facility])  Result Date: 08/26/2021  Lower Venous DVT Study Patient Name:  Onis Esty.  Date of Exam:   08/25/2021 Medical Rec #: [id number]        Accession #:    [id number] Date of Birth: 07/18/1936        Patient  Gender: M Patient Age:   77 years Exam Location:  [facility] Procedure:      VAS [place] LOWER EXTREMITY VENOUS (DVT) Referring Phys: Beverley Fiedler SOTO --------------------------------------------------------------------------------  Indications: Pain, and Swelling.  Risk Factors: PE 05/27/21 Cancer Pulmonary mass Recently admitted with seizure, discharged 08/22/21. Acute urinary retention, has foley. Anticoagulation: Eliquis. Limitations: Body habitus, edema, and poor ultrasound/tissue interface. Comparison Study: No prior study on file Performing Technologist: Sharion Dove RVS  Examination Guidelines: A complete evaluation includes B-mode imaging, spectral Doppler, color Doppler, and power Doppler as needed of all accessible portions of each vessel. Bilateral testing is considered an integral part of a complete examination. Limited examinations for reoccurring indications may be performed as noted. The reflux portion of the exam is performed with the patient in reverse Trendelenburg.  +-----+---------------+---------+-----------+----------+--------------+ RIGHTCompressibilityPhasicitySpontaneityPropertiesThrombus Aging +-----+---------------+---------+-----------+----------+--------------+ CFV  Full           Yes      Yes                                  +-----+---------------+---------+-----------+----------+--------------+   +---------+---------------+---------+-----------+----------+-------------------+ LEFT     CompressibilityPhasicitySpontaneityPropertiesThrombus Aging      +---------+---------------+---------+-----------+----------+-------------------+ CFV      None           No       No                                       +---------+---------------+---------+-----------+----------+-------------------+ SFJ      None                                                             +---------+---------------+---------+-----------+----------+-------------------+ FV Prox  None                                                             +---------+---------------+---------+-----------+----------+-------------------+ FV Mid   None                                                             +---------+---------------+---------+-----------+----------+-------------------+ FV DistalNone                                                             +---------+---------------+---------+-----------+----------+-------------------+ PFV                                                   Not well visualized +---------+---------------+---------+-----------+----------+-------------------+ POP      None  No       No                                       +---------+---------------+---------+-----------+----------+-------------------+ PTV      None                                                             +---------+---------------+---------+-----------+----------+-------------------+ PERO                                                  Not well visualized +---------+---------------+---------+-----------+----------+-------------------+ EIV      None                                         Acute               +---------+---------------+---------+-----------+----------+-------------------+ CIV                                                    Not visualized      +---------+---------------+---------+-----------+----------+-------------------+     Summary: RIGHT: - No evidence of common femoral vein obstruction.  LEFT: - Findings consistent with acute deep vein thrombosis involving the left common femoral vein, SF junction, left femoral vein, left popliteal vein, left posterior tibial veins, and external iliac vein.  *See table(s) above for measurements and observations. Electronically signed by Jamelle Haring on 08/26/2021 at 10:04:30 AM.    Final     Microbiology: Recent Results (from the past 240 hour(s))  Resp Panel by RT-PCR (Flu A&B, Covid) Nasopharyngeal Swab     Status: None   Collection Time: 08/25/21  9:10 PM   Specimen: Nasopharyngeal Swab; Nasopharyngeal(NP) swabs in vial transport medium  Result Value Ref Range Status   SARS Coronavirus 2 by RT PCR NEGATIVE NEGATIVE Final    Comment: (NOTE) SARS-CoV-2 target nucleic acids are NOT DETECTED.  The SARS-CoV-2 RNA is generally detectable in upper respiratory specimens during the acute phase of infection. The lowest concentration of SARS-CoV-2 viral copies this assay can detect is 138 copies/mL. A negative result does not preclude SARS-Cov-2 infection and should not be used as the sole basis for treatment or other patient management decisions. A negative result may occur with  improper specimen collection/handling, submission of specimen other than nasopharyngeal swab, presence of viral mutation(s) within the areas targeted by this assay, and inadequate number of viral copies(<138 copies/mL). A negative result must be combined with clinical observations, patient history, and epidemiological information. The expected result is Negative.  Fact Sheet for Patients:  EntrepreneurPulse.com.au  Fact Sheet for Healthcare Providers:  IncredibleEmployment.be  This test is no t yet approved or  cleared by the [place] FDA and  has been authorized for detection and/or diagnosis of SARS-CoV-2 by FDA under an Emergency Use Authorization (EUA). This EUA will remain  in effect (meaning this test can be used) for the duration of the COVID-19 declaration under Section 564(b)(1) of the Act, 21 U.S.C.section 360bbb-3(b)(1), unless the authorization is terminated  or revoked sooner.       Influenza A by PCR NEGATIVE NEGATIVE Final   Influenza B by PCR NEGATIVE NEGATIVE Final    Comment: (NOTE) The Xpert Xpress SARS-CoV-2/FLU/RSV plus assay is intended as an aid in the diagnosis of influenza from Nasopharyngeal swab specimens and should not be used as a sole basis for treatment. Nasal washings and aspirates are unacceptable for Xpert Xpress SARS-CoV-2/FLU/RSV testing.  Fact Sheet for Patients: EntrepreneurPulse.com.au  Fact Sheet for Healthcare Providers: IncredibleEmployment.be  This test is not yet approved or cleared by the [place] FDA and has been authorized for detection and/or diagnosis of SARS-CoV-2 by FDA under an Emergency Use Authorization (EUA). This EUA will remain in effect (meaning this test can be used) for the duration of the COVID-19 declaration under Section 564(b)(1) of the Act, 21 U.S.C. section 360bbb-3(b)(1), unless the authorization is terminated or revoked.  Performed at [facility], [place] [address] [place], [place] [address]   Urine Culture     Status: Abnormal   Collection Time: 08/25/21 10:51 PM   Specimen: Urine, Random  Result Value Ref Range Status   Specimen Description URINE, RANDOM  Final   Special Requests   Final    STERILE CUP SENT Performed at [facility], [place] [address] [place], [place] [address]    Culture (A)  Final    40,000 COLONIES/mL ESCHERICHIA COLI Confirmed Extended Spectrum Beta-Lactamase Producer (ESBL).  In bloodstream infections from ESBL organisms, carbapenems  are preferred over piperacillin/tazobactam. They are shown to have a lower risk of mortality.    Report Status 08/28/2021 FINAL  Final   Organism ID, Bacteria ESCHERICHIA COLI (A)  Final      Susceptibility   Escherichia coli - MIC*    AMPICILLIN >=32 RESISTANT Resistant     CEFAZOLIN >=64 RESISTANT Resistant     CEFEPIME 16 RESISTANT Resistant     CEFTRIAXONE >=64 RESISTANT Resistant     CIPROFLOXACIN >=4 RESISTANT Resistant     GENTAMICIN <=1 SENSITIVE Sensitive     IMIPENEM <=0.25 SENSITIVE Sensitive     NITROFURANTOIN 128 RESISTANT Resistant     TRIMETH/SULFA >=320 RESISTANT Resistant     AMPICILLIN/SULBACTAM >=32 RESISTANT Resistant     PIP/TAZO <=4 SENSITIVE Sensitive     * 40,000 COLONIES/mL ESCHERICHIA COLI     Labs: Basic Metabolic Panel: Recent Labs  Lab 08/25/21 1758 08/26/21 0743  NA 134* 135  K 4.0 4.2  CL 100 101  CO2 25 23  GLUCOSE 100* 82  BUN 13 14  CREATININE 0.77 0.75  CALCIUM 9.3 8.8*   Liver Function Tests: Recent Labs  Lab 08/25/21 1758 08/26/21 0743  AST 40 31  ALT 38 30  ALKPHOS 57 49  BILITOT 1.1 0.8  PROT 7.0 6.2*  ALBUMIN 2.9* 2.4*   CBC: Recent Labs  Lab 08/25/21 1758 08/26/21 0743 08/27/21 0050 08/28/21 0205 08/29/21 0050 08/30/21 0102  WBC 14.0* 11.9* 11.3* 9.3 8.2 8.2  NEUTROABS 10.7*  --   --   --   --   --   HGB 12.8* 12.1* 11.1* 11.3* 11.4* 11.4*  HCT 39.9 37.2* 34.4* 34.5* 35.3* 35.7*  MCV 92.6 92.8 91.2 91.3 91.0 91.1  PLT 286 262 303 319 296 336   Principal Problem:   DVT (deep venous  thrombosis) ([place]) Active Problems:   Right lower lobe lung mass   Seizure disorder ([place])   Essential (primary) hypertension   Acute urinary retention   Time coordinating discharge: 50 minutes  Signed:  Murray Hodgkins, MD  [facility]  08/30/2021, 2:25 PM

## 2021-08-30 NOTE — Assessment & Plan Note (Signed)
--   Seen initially in July 2020, then again in January [id number] hypermetabolic on PET scan, referred to cardiothoracic surgery in the past for resection and oncology for radiation but patient declined these referrals.  Further work-up recommended patient has declined.  Recommend outpatient follow-up with PCP.

## 2021-08-30 NOTE — TOC Benefit Eligibility Note (Signed)
Transition of Care [facility]) Benefit Eligibility Note    Patient Details  Name: Travis Salazar. MRN: [id number] Date of Birth: May 23, 1936   Medication/Dose: XARELTO 15 MG BID    and  XARELTO 10 MG BID NOT COVER  Covered?: Yes  Tier: 3 Drug  Prescription Coverage [facility]  [place] with Person/Company/Phone Number:: RYAN @ [facility] RX #  [phone]  Co-Pay: $47.00  Prior Approval: No  Deductible: Met (OUT-OF-POCKET:UNMET)  Additional Notes: ELIQUIS  5 MG BID  : COVER- YES  ,  CO-PAY- $ 47.00 , TIER- 3 DRUG  , P/A-NO    Memory Argue Phone Number: 08/30/2021, 10:29 AM

## 2021-08-30 NOTE — Assessment & Plan Note (Signed)
--   Stable.  Continue amlodipine, metoprolol

## 2021-08-31 ENCOUNTER — Telehealth: Payer: Self-pay | Admitting: Internal Medicine

## 2021-08-31 NOTE — Telephone Encounter (Signed)
Scheduled appt per 9/15 referral. Called pt, no answer. Left msg with appt date and time and location. Also left my direct phone number for pt to call back to confirm appt.

## 2021-09-03 ENCOUNTER — Telehealth: Payer: Medicare Other

## 2021-09-03 NOTE — Chronic Care Management (AMB) (Signed)
  Care Management   Note  09/03/2021 Name: Travis Gongwer. MRN: [id number] DOB: Nov 09, 1936  [name] is a 85 y.o. year old male who is a primary care patient of [name], MD and is actively engaged with the care management team. I reached out to [facility] by phone today to assist with re-scheduling an initial visit with the Pharmacist  Follow up plan: Telephone appointment with care management team member scheduled for:10/05/21  [facility]  Direct Dial: [phone]

## 2021-09-04 ENCOUNTER — Telehealth: Payer: Self-pay | Admitting: Family Medicine

## 2021-09-04 DIAGNOSIS — I82442 Acute embolism and thrombosis of left tibial vein: Secondary | ICD-10-CM | POA: Diagnosis not present

## 2021-09-04 DIAGNOSIS — I82432 Acute embolism and thrombosis of left popliteal vein: Secondary | ICD-10-CM | POA: Diagnosis not present

## 2021-09-04 DIAGNOSIS — I82412 Acute embolism and thrombosis of left femoral vein: Secondary | ICD-10-CM | POA: Diagnosis not present

## 2021-09-04 DIAGNOSIS — I82422 Acute embolism and thrombosis of left iliac vein: Secondary | ICD-10-CM | POA: Diagnosis not present

## 2021-09-04 NOTE — Telephone Encounter (Signed)
Pt's daughter Lynelle Smoke is following up in regards to an appt for Mr. Mehul. Tammy is the parents caregiver, however Tammy lives in [place] and she is not listed on pt's DPR Tammy states that [name]'s number might be blocked from our office (she just learned how to block numbers), Tammy can be reached at [phone] Pt was released from [facility] on 08/30/21

## 2021-09-04 NOTE — Telephone Encounter (Signed)
Home Health Verbal Orders - Caller/Agency: Amy// [facility] Callback Number: [phone] [id number]  [id number] OT/PT/Skilled Nursing/Social Work/Speech Therapy: PT Frequency: 2w1, 1w3

## 2021-09-05 DIAGNOSIS — I82422 Acute embolism and thrombosis of left iliac vein: Secondary | ICD-10-CM | POA: Diagnosis not present

## 2021-09-05 DIAGNOSIS — I82412 Acute embolism and thrombosis of left femoral vein: Secondary | ICD-10-CM | POA: Diagnosis not present

## 2021-09-05 DIAGNOSIS — I82442 Acute embolism and thrombosis of left tibial vein: Secondary | ICD-10-CM | POA: Diagnosis not present

## 2021-09-05 DIAGNOSIS — I82432 Acute embolism and thrombosis of left popliteal vein: Secondary | ICD-10-CM | POA: Diagnosis not present

## 2021-09-05 NOTE — Telephone Encounter (Signed)
Verbal orders given. KW

## 2021-09-06 ENCOUNTER — Encounter: Payer: Self-pay | Admitting: Neurology

## 2021-09-06 ENCOUNTER — Telehealth (HOSPITAL_COMMUNITY): Payer: Self-pay

## 2021-09-06 ENCOUNTER — Other Ambulatory Visit (HOSPITAL_COMMUNITY): Payer: Self-pay

## 2021-09-06 NOTE — Telephone Encounter (Signed)
Pt has been scheduled 9/27 with Dr Marlou Porch for f/u s/p hosp

## 2021-09-06 NOTE — Telephone Encounter (Signed)
Pharmacy Transitions of Care Follow-up Telephone Call  Date of discharge: 08/30/21  Discharge Diagnosis: DVT  How have you been since you were released from the hospital?  Patient has been well since discharge. PT came to house yesterday and will have another session on 09/07/21. No questions about meds at this time.  Medication changes made at discharge:  - START: Eliquis Starter Pack  Medication changes verified by the patient? Yes    Medication Accessibility:  Home Pharmacy: not discussed   Was the patient provided with refills on discharged medications? No   Have all prescriptions been transferred from [facility] to home pharmacy? N/a   Is the patient able to afford medications? Has insurance    Medication Review:  APIXABAN (ELIQUIS)  Apixaban 10 mg BID initiated on 08/30/21. Will switch to apixaban 5 mg BID after 7 days (DATE 09/06/21).  - Discussed importance of taking medication around the same time everyday  - Advised patient of medications to avoid (NSAIDs, ASA)  - Educated that Tylenol (acetaminophen) will be the preferred analgesic to prevent risk of bleeding  - Emphasized importance of monitoring for signs and symptoms of bleeding (abnormal bruising, prolonged bleeding, nose bleeds, bleeding from gums, discolored urine, black tarry stools)  - Advised patient to alert all providers of anticoagulation therapy prior to starting a new medication or having a procedure    Follow-up Appointments:  PCP Hospital f/u appt confirmed? Scheduled to see Family Medicine on 10/05/21   Specialist Hospital f/u appt confirmed? None currently scheduled  If their condition worsens, is the pt aware to call PCP or go to the Emergency Dept.? Yes  Final Patient Assessment: Patient has follow up scheduled and knows to get maintenance refills at follow up.

## 2021-09-07 ENCOUNTER — Emergency Department (HOSPITAL_COMMUNITY)
Admission: EM | Admit: 2021-09-07 | Discharge: 2021-09-08 | Disposition: A | Discharge status: Home / Self Care | Payer: Medicare Other | Attending: Emergency Medicine | Admitting: Emergency Medicine

## 2021-09-07 ENCOUNTER — Encounter (HOSPITAL_COMMUNITY): Payer: Self-pay

## 2021-09-07 ENCOUNTER — Telehealth: Payer: Self-pay | Admitting: Family Medicine

## 2021-09-07 ENCOUNTER — Other Ambulatory Visit: Payer: Self-pay

## 2021-09-07 DIAGNOSIS — I10 Essential (primary) hypertension: Secondary | ICD-10-CM | POA: Insufficient documentation

## 2021-09-07 DIAGNOSIS — Z79899 Other long term (current) drug therapy: Secondary | ICD-10-CM | POA: Insufficient documentation

## 2021-09-07 DIAGNOSIS — I2510 Atherosclerotic heart disease of native coronary artery without angina pectoris: Secondary | ICD-10-CM | POA: Diagnosis not present

## 2021-09-07 DIAGNOSIS — Z87891 Personal history of nicotine dependence: Secondary | ICD-10-CM | POA: Diagnosis not present

## 2021-09-07 DIAGNOSIS — N390 Urinary tract infection, site not specified: Secondary | ICD-10-CM | POA: Diagnosis not present

## 2021-09-07 LAB — URINALYSIS, ROUTINE W REFLEX MICROSCOPIC
Bilirubin Urine: NEGATIVE
Glucose, UA: NEGATIVE mg/dL
Ketones, ur: 5 mg/dL — AB
Nitrite: POSITIVE — AB
Protein, ur: 30 mg/dL — AB
RBC / HPF: >50 RBC/hpf — ABNORMAL HIGH (ref 0–5)
Specific Gravity, Urine: 1.014 (ref 1.005–1.030)
WBC, UA: >50 WBC/hpf — ABNORMAL HIGH (ref 0–5)
pH: 5 (ref 5.0–8.0)

## 2021-09-07 LAB — CBC
HCT: 41.4 % (ref 39.0–52.0)
Hemoglobin: 12.7 g/dL — ABNORMAL LOW (ref 13.0–17.0)
MCH: 28.7 pg (ref 26.0–34.0)
MCHC: 30.7 g/dL (ref 30.0–36.0)
MCV: 93.7 fL (ref 80.0–100.0)
Platelets: 267 10*3/uL (ref 150–400)
RBC: 4.42 MIL/uL (ref 4.22–5.81)
RDW: 13.6 % (ref 11.5–15.5)
WBC: 14.1 10*3/uL — ABNORMAL HIGH (ref 4.0–10.5)
nRBC: 0 % (ref 0.0–0.2)

## 2021-09-07 LAB — BASIC METABOLIC PANEL
Anion gap: 12 (ref 5–15)
BUN: 13 mg/dL (ref 8–23)
CO2: 23 mmol/L (ref 22–32)
Calcium: 9.8 mg/dL (ref 8.9–10.3)
Chloride: 99 mmol/L (ref 98–111)
Creatinine, Ser: 0.9 mg/dL (ref 0.61–1.24)
GFR, Estimated: >60 mL/min (ref >60)
Glucose, Bld: 131 mg/dL — ABNORMAL HIGH (ref 70–99)
Potassium: 4 mmol/L (ref 3.5–5.1)
Sodium: 134 mmol/L — ABNORMAL LOW (ref 135–145)

## 2021-09-07 MED ORDER — LACTATED RINGERS IV BOLUS
1000.0000 mL | Freq: Once | INTRAVENOUS | Status: AC
  Administered 2021-09-07: 1000 mL via INTRAVENOUS

## 2021-09-07 NOTE — ED Notes (Signed)
Patient has not voided, water given. Bladder scan showed 72mL.

## 2021-09-07 NOTE — Telephone Encounter (Signed)
I can't tell from his hospital records if he is supposed to still be taking it or not. Have they been checking his blood pressure? If his blood pressure has been normal off of the medication, then he can stay off of it until his follow up appointment with Dr. Rosanna Randy

## 2021-09-07 NOTE — ED Notes (Signed)
Per urologist remove foley.  Foley removed 10cc fluid removed from the balloon and removed foley.

## 2021-09-07 NOTE — ED Triage Notes (Signed)
Pt reports bleeding around his urinary catheter that he noticed this morning. Catheter has been in place for about 2 weeks, scheduled to see urology on Tuesday to get it taken out. Pt denies any pain, no blood noted in foley bag

## 2021-09-07 NOTE — ED Provider Notes (Signed)
Emergency Medicine Provider Triage Evaluation Note  Travis Salazar. , a 85 y.o. male  was evaluated in triage.  Pt complains of blood at  Urinary catheter.  Catheter was placed 2 weeks ago and is scheduled to see urology this coming Tuesday to get it taken out.  He noticed a significant mount of blood surrounding the catheter and on his underwear.  Denies any pain, and no noticeable blood in his urine.  He is concerned that his bleeding given that he is on Eliquis.  Review of Systems  Positive: As above Negative: As above  Physical Exam  BP (!) 124/95 (BP Location: Right Arm)   Pulse (!) 122   Temp 98.5 F (36.9 C) (Oral)   Resp 18   Ht 5\' 8"  (1.727 m)   Wt 95.3 kg   SpO2 99%   BMI 31.93 kg/m  Gen:   Awake, no distress   Resp:  Normal effort  MSK:   Moves extremities without difficulty  GU exam deferred, abdomen soft and nontender, no visible blood in Foley bag  Medical Decision Making  Medically screening exam initiated at 1:03 PM.  Appropriate orders placed.  [name] was informed that the remainder of the evaluation will be completed by another provider, this initial triage assessment does not replace that evaluation, and the importance of remaining in the ED until their evaluation is complete.     Garald Balding, PA-C 67/01/41 0301    Lianne Cure, DO 31/43/88 1155

## 2021-09-07 NOTE — Progress Notes (Signed)
Cardiology Office Note:    Date:  09/11/2021   ID:  Travis Salazar., DOB 15-May-1936, MRN [id number]  PCP:  [name], MD  Cardiologist:  Candee Furbish, MD  Electrophysiologist:  None   Referring MD: [name],*     History of Present Illness:    Travis Salazar. is a 85 y.o. male here for a hospital follow-up for acute DVT.   He also was seen by the ED on 09/07/2021 present with bleeding and bruising around foley catheter site.Catheter was expected for removal and voiding trail in 4 more days since ED visit. He developed blood on the edge of his penis without substantial pain however with mild irritation.   Today, he is accompanied by his daughter. He reports he has left leg swelling and tightness from his DVT. He includes he was on his blood thinner the whole experience and states that the doctor in ED told him he "was an easy bleeder". He has an upcoming vascular appointment.    Since his visit in the ED, he was prescribed Eliquis starter pack and has had no complications. He denies bleeding.   He also denies chest pains, chest tightness, chest pressure or shortness of breath  Past Medical History:  Diagnosis Date   Hyperlipidemia    Hypertension    Stroke [facility])     Past Surgical History:  Procedure Laterality Date   LIPOMA EXCISION     neck   PROSTATECTOMY     due to prostate cancer    Current Medications: Current Meds  Medication Sig   amLODipine (NORVASC) 10 MG tablet TAKE 1 TABLET BY MOUTH EVERY DAY   APIXABAN (ELIQUIS) VTE STARTER PACK (10MG  AND 5MG ) Take as directed on package: start with two-5mg  tablets twice daily for 7 days. On day 8, switch to one-5mg  tablet twice daily.   atorvastatin (LIPITOR) 40 MG tablet TAKE 1 TABLET (40 MG TOTAL) BY MOUTH DAILY AT 6 PM.   bethanechol (URECHOLINE) 10 MG tablet Take 1 tablet (10 mg total) by mouth 3 (three) times daily.   cephALEXin (KEFLEX) 500 MG capsule Take 1 capsule (500 mg total) by mouth 2  (two) times daily for 5 days.   colchicine 0.6 MG tablet Take 1 tablet (0.6 mg total) by mouth as needed (gout).   levETIRAcetam (KEPPRA) 1000 MG tablet Take 1 tablet (1,000 mg total) by mouth 2 (two) times daily.   metoprolol tartrate (LOPRESSOR) 25 MG tablet Take one-half tablet (12.5 mg total) by mouth 2 (two) times daily.   tamsulosin (FLOMAX) 0.4 MG CAPS capsule Take 1 capsule (0.4 mg total) by mouth daily.     Allergies:   Patient has no known allergies.   Social History   Socioeconomic History   Marital status: Married    Spouse name: [name]   Number of children: 4   Years of education: 10th grade   Highest education level: 9th grade  Occupational History   Occupation: retired  Tobacco Use   Smoking status: Former    Packs/day: 1.00    Years: 50.00    Pack years: 50.00    Types: Cigarettes    Quit date: 12/15/2009    Years since quitting: 11.7   Smokeless tobacco: Never  Vaping Use   Vaping Use: Never used  Substance and Sexual Activity   Alcohol use: No    Alcohol/week: 0.0 standard drinks   Drug use: No   Sexual activity: Not on file  Other Topics  Concern   Not on file  Social History Narrative   Not on file   Social Determinants of Health   Financial Resource Strain: Not on file  Food Insecurity: Not on file  Transportation Needs: Not on file  Physical Activity: Not on file  Stress: Not on file  Social Connections: Not on file     Family History: The patient's family history includes COPD in his mother; Heart attack in his father.  ROS:   Please see the history of present illness.   (+) leg swelling and tightness  All other systems reviewed and are negative.  EKGs/Labs/Other Studies Reviewed:    The following studies were reviewed today: ECHO 6/22:  IMPRESSIONS   1. Left ventricular ejection fraction, by estimation, is 60 to 65%. The  left ventricle has normal function. The left ventricle has no regional  wall motion abnormalities. There is  mild left ventricular hypertrophy.  Left ventricular diastolic parameters  are consistent with Grade I diastolic dysfunction (impaired relaxation).   2. Right ventricular systolic function is normal. The right ventricular  size is normal. There is moderate to severely elevated pulmonary artery  systolic pressure.   3. The mitral valve is normal in structure. No evidence of mitral valve  regurgitation.   4. The aortic valve is calcified. There is severe calcifcation of the  aortic valve. Aortic valve regurgitation is not visualized. Moderate  aortic valve stenosis. Vmax 3.3 m/s, MG 24 mmHg, AVA 1.0 cm^2, DI 0.3   Echocardiogram 05/2019:  IMPRESSIONS   1. Left ventricular ejection fraction, by estimation, is 60 to 65%. The  left ventricle has normal function. The left ventricle has no regional  wall motion abnormalities. There is mild left ventricular hypertrophy.  Left ventricular diastolic parameters  are consistent with Grade I diastolic dysfunction (impaired relaxation).   2. Right ventricular systolic function is normal. The right ventricular  size is normal. There is moderate to severely elevated pulmonary artery  systolic pressure.   3. The mitral valve is normal in structure. No evidence of mitral valve  regurgitation.   4. The aortic valve is calcified. There is severe calcifcation of the  aortic valve. Aortic valve regurgitation is not visualized. Moderate  aortic valve stenosis. Vmax 3.3 m/s, MG 24 mmHg, AVA 1.0 cm^2, DI 0.3   CARDIAC TELEMETRY 11/20:  Sinus rhythm with first-degree AV block. No evidence of atrial fibrillation Transient episode of second-degree heart block type I. No indication for pacemaker No significant pauses.  ECHO 07/20:   IMPRESSIONS   1. The left ventricle has normal systolic function, with an ejection  fraction of 55-60%. The cavity size was normal. Left ventricular diastolic  Doppler parameters are consistent with pseudonormal. No evidence of  left  ventricular regional wall motion  abnormalities.   2. The right ventricle has normal systolc function. The cavity was  normal. There is no increase in right ventricular wall thickness.   3. There is mild to moderate mitral annular calcification present. No  evidence of mitral valve stenosis. Mild mitral regurgitation.   4. The aortic valve is tricuspid Severe calcifcation of the aortic valve.  Mild-moderate stenosis of the aortic valve. Mean gradient 16 mmHg with  calculated AVA 1.17 cm^2.   5. Normal IVC size. PA systolic pressure 51 mmHg.   6. The aortic root is normal in size and structure.   EKG:  09/22: sinus rhythm,HR 80 bpm   Recent Labs: 08/23/2021: Magnesium 2.1 08/26/2021: ALT 30 09/07/2021: BUN 13;  Creatinine, Ser 0.90; Hemoglobin 12.7; Platelets 267; Potassium 4.0; Sodium 134  Recent Lipid Panel    Component Value Date/Time   CHOL 187 08/29/2020 1135   TRIG 89 08/14/2021 0351   HDL 40 08/29/2020 1135   CHOLHDL 4.7 08/29/2020 1135   CHOLHDL 7.6 06/27/2019 0548   VLDL 25 06/27/2019 0548   LDLCALC 125 (H) 08/29/2020 1135   LDLCALC 119 (H) 10/06/2017 1208    Physical Exam:    VS:  BP (!) 106/50 (BP Location: Left Arm, Patient Position: Sitting)   Pulse (!) 59   Ht 5\' 8"  (1.727 m)   Wt 187 lb (84.8 kg)   BMI 28.43 kg/m     Wt Readings from Last 3 Encounters:  09/11/21 187 lb (84.8 kg)  09/07/21 210 lb (95.3 kg)  08/27/21 197 lb 15.6 oz (89.8 kg)     GEN:  Well nourished, well developed in no acute distress HEENT: Normal NECK: No JVD; No carotid bruits LYMPHATICS: No lymphadenopathy CARDIAC: RRR, 2/6 systolic murmur, no rubs, gallops RESPIRATORY:  Clear to auscultation without rales, wheezing or rhonchi  ABDOMEN: Soft, non-tender, non-distended MUSCULOSKELETAL:  No edema; No deformity  SKIN: Warm and dry NEUROLOGIC:  Alert and oriented x 3 PSYCHIATRIC:  Normal affect   ASSESSMENT:    1. Acute deep vein thrombosis (DVT) of left lower extremity,  unspecified vein (HCC)   2. Nonrheumatic aortic valve stenosis   3. Essential (primary) hypertension     PLAN:   DVT (deep venous thrombosis) (HCC) Left DVT lower extremity, extensive traversing of the iliac region.  He has an appointment coming up on October 7 with vascular surgery.  He does state that with Eliquis therapy it has improved but his left leg is still markedly edematous.  He has had prior PE back in in June 2022.  Aortic stenosis Moderate aortic stenosis seen on echocardiogram March 2022.  Personally reviewed.  Next year consider repeat echocardiogram.  Essential (primary) hypertension Doing well with current medical management with amlodipine 10 mg as well as metoprolol 12.5 mg twice a day.  FOLLOW UP IN 1 YEAR   Medication Adjustments/Labs and Tests Ordered: Current medicines are reviewed at length with the patient today.  Concerns regarding medicines are outlined above.  No orders of the defined types were placed in this encounter.  No orders of the defined types were placed in this encounter.   Patient Instructions  Medication Instructions:  The current medical regimen is effective;  continue present plan and medications.  *If you need a refill on your cardiac medications before your next appointment, please call your pharmacy*  Follow-Up: At [facility], you and your health needs are our priority.  As part of our continuing mission to provide you with exceptional heart care, we have created designated Provider Care Teams.  These Care Teams include your primary Cardiologist (physician) and Advanced Practice Providers (APPs -  Physician Assistants and Nurse Practitioners) who all work together to provide you with the care you need, when you need it.  We recommend signing up for the patient portal called "MyChart".  Sign up information is provided on this After Visit Summary.  MyChart is used to connect with patients for Virtual Visits (Telemedicine).  Patients  are able to view lab/test results, encounter notes, upcoming appointments, etc.  Non-urgent messages can be sent to your provider as well.   To learn more about what you can do with MyChart, go to [email or web address].    Your next appointment:  1 year(s)  The format for your next appointment:   In Person  Provider:   Candee Furbish, MD   Thank you for choosing [facility]!!      I,Jada Bradford,acting as a scribe for Candee Furbish, MD.,have documented all relevant documentation on the behalf of Candee Furbish, MD,as directed by  Candee Furbish, MD while in the presence of Candee Furbish, MD.  I, Candee Furbish, MD, have reviewed all documentation for this visit. The documentation on 09/11/21 for the exam, diagnosis, procedures, and orders are all accurate and complete.  Signed, Candee Furbish, MD  09/11/2021 10:04 AM    [facility]

## 2021-09-07 NOTE — ED Provider Notes (Signed)
[facility] EMERGENCY DEPARTMENT Provider Note   CSN: [id number] Arrival date & time: 09/07/21  1253     History Chief Complaint  Patient presents with   Bleeding/Bruising    Volanda Napoleon Cosmo Tetreault. is a 85 y.o. male.  HPI Presents with concern of bleeding around Foley catheter site.  Patient has multiple medical issues, history of allergies.  Particular, patient has history of DVT, requiring anticoagulation which he is taking regularly according to him. Patient seen and evaluated, admitted 2 weeks ago, and on discharge had Foley catheter placed with expectation for removal and voiding trial in 4 days.  Over the last 24 hours patient developed blood on the edge of his penis, without substantial pain, though with mild irritation.  No fever, chills, nausea, vomiting, no chest pain or dyspnea.  He continues to take medication as directed, Continues to drain into the catheter bag.   Discharge plan from last hospitalization 2 weeks ago as below: Recommendations for Outpatient Follow-up:  * DVT (deep venous thrombosis) (HCC) --Venous duplex of left lower extremity showed acute DVT involving left common femoral vein, SL junction, left femoral vein, left popliteal vein, left posterior tibial vein and external iliac vein.  -- Home on apixaban, see discussion below   Right lower lobe lung mass -- Seen initially in July 2020, then again in January 8101 hypermetabolic on PET scan, referred to cardiothoracic surgery in the past for resection and oncology for radiation but patient declined these referrals.  Further work-up recommended patient has declined.   --daugther wishes to proceed with referral, will refer as above   Acute urinary retention -- Present since last admission, urology recommending continuing Foley catheter and follow-up as an outpatient for voiding trial  Past Medical History:  Diagnosis Date   Hyperlipidemia    Hypertension    Stroke [facility])     Patient Active  Problem List   Diagnosis Date Noted   Seizure disorder ([place]) 08/29/2021   DVT (deep venous thrombosis) ([place]) 08/25/2021   Acute urinary retention 08/18/2021   Status epilepticus ([place]) 08/13/2021   Acute pulmonary embolism without acute cor pulmonale ([place]) 05/27/2021   Demand ischemia ([place]) 05/27/2021   CAD (coronary artery disease) 05/27/2021   Emphysema lung ([place]) 05/27/2021   Acute left hemiparesis ([place]) 04/30/2020   Paroxysmal supraventricular tachycardia ([place]) 04/30/2020   Right lower lobe lung mass 01/07/2020   CVA (cerebral vascular accident) ([place]) 06/26/2019   Wrist swelling, right 06/03/2017   S/P prostatectomy 10/08/2016   Snores 10/08/2016   Essential (primary) hypertension 09/27/2015   History of tobacco use 09/27/2015   Hypercholesterolemia 09/27/2015   Blood glucose elevated 09/27/2015   Adiposity 09/27/2015    Past Surgical History:  Procedure Laterality Date   LIPOMA EXCISION     neck   PROSTATECTOMY     due to prostate cancer       Family History  Problem Relation Age of Onset   COPD Mother    Heart attack Father     Social History   Tobacco Use   Smoking status: Former    Packs/day: 1.00    Years: 50.00    Pack years: 50.00    Types: Cigarettes    Quit date: 12/15/2009    Years since quitting: 11.7   Smokeless tobacco: Never  Vaping Use   Vaping Use: Never used  Substance Use Topics   Alcohol use: No    Alcohol/week: 0.0 standard drinks   Drug use: No    Home  Medications Prior to Admission medications   Medication Sig Start Date End Date Taking? Authorizing Provider  amLODipine (NORVASC) 10 MG tablet TAKE 1 TABLET BY MOUTH EVERY DAY Patient taking differently: Take 10 mg by mouth daily. 04/18/21   [name], MD  APIXABAN Arne [place]) VTE STARTER PACK (10MG  AND 5MG ) Take as directed on package: start with two-5mg  tablets twice daily for 7 days. On day 8, switch to one-5mg  tablet twice daily. 08/30/21   Samuella Cota, MD   atorvastatin (LIPITOR) 40 MG tablet TAKE 1 TABLET (40 MG TOTAL) BY MOUTH DAILY AT 6 PM. 11/11/20   [name], MD  bethanechol (URECHOLINE) 10 MG tablet Take 1 tablet (10 mg total) by mouth 3 (three) times daily. 08/23/21 09/22/21  Elodia Florence., MD  colchicine 0.6 MG tablet Take 1 tablet (0.6 mg total) by mouth as needed (gout). Patient not taking: No sig reported 05/28/21   Patrecia Pour, MD  hydrALAZINE (APRESOLINE) 100 MG tablet Take 100 mg by mouth 2 (two) times daily.    [provider]  levETIRAcetam (KEPPRA) 1000 MG tablet Take 1 tablet (1,000 mg total) by mouth 2 (two) times daily. 08/23/21 09/22/21  Elodia Florence., MD  metoprolol tartrate (LOPRESSOR) 25 MG tablet Take one-half tablet (12.5 mg total) by mouth 2 (two) times daily. 08/23/21 09/22/21  Elodia Florence., MD  tamsulosin (FLOMAX) 0.4 MG CAPS capsule Take 1 capsule (0.4 mg total) by mouth daily. 08/24/21 09/23/21  Elodia Florence., MD    Allergies    Patient has no known allergies.  Review of Systems   Review of Systems  Constitutional:        Per HPI, otherwise negative  HENT:         Per HPI, otherwise negative  Respiratory:         Per HPI, otherwise negative  Cardiovascular:        Per HPI, otherwise negative  Gastrointestinal:  Negative for vomiting.  Endocrine:       Negative aside from HPI  Genitourinary:        Neg aside from HPI   Musculoskeletal:        Per HPI, otherwise negative  Skin: Negative.   Neurological:  Negative for syncope.  Hematological:        Per HPI otherwise negative   Physical Exam Updated Vital Signs BP 113/78 (BP Location: Right Arm)   Pulse (!) 119   Temp 98.5 F (36.9 C) (Oral)   Resp 18   Ht 5\' 8"  (1.727 m)   Wt 95.3 kg   SpO2 100%   BMI 31.93 kg/m   Physical Exam Vitals and nursing note reviewed. Exam conducted with a chaperone present.  Constitutional:      General: He is not in acute distress.    Appearance: He is  well-developed. He is not ill-appearing, toxic-appearing or diaphoretic.  HENT:     Head: Normocephalic and atraumatic.  Eyes:     Conjunctiva/sclera: Conjunctivae normal.  Cardiovascular:     Rate and Rhythm: Normal rate and regular rhythm.  Pulmonary:     Effort: Pulmonary effort is normal. No respiratory distress.     Breath sounds: No stridor.  Abdominal:     General: There is no distension.  Genitourinary:   Skin:    General: Skin is warm and dry.  Neurological:     Mental Status: He is alert and oriented to person, place, and  time.    ED Results / Procedures / Treatments   Labs (all labs ordered are listed, but only abnormal results are displayed) Labs Reviewed  CBC - Abnormal; Notable for the following components:      Result Value   WBC 14.1 (*)    Hemoglobin 12.7 (*)    All other components within normal limits  BASIC METABOLIC PANEL - Abnormal; Notable for the following components:   Sodium 134 (*)    Glucose, Bld 131 (*)    All other components within normal limits  URINALYSIS, ROUTINE W REFLEX MICROSCOPIC    EKG None  Radiology No results found.  Procedures Procedures   Medications Ordered in ED Medications - No data to display  ED Course  I have reviewed the triage vital signs and the nursing notes.  Pertinent labs & imaging results that were available during my care of the patient were reviewed by me and considered in my medical decision making (see chart for details).   6:06 PM Patient in no distress, awake, alert.  Discussed indication for Foley replacement, patient has preference for removal with attempted voiding trial today, rather than waiting for additional days.  This was accommodated.  12:05 AM Patient was able to urinate spontaneously, has no ongoing complaints, is hemodynamically unremarkable.  Urinalysis reveals likely infection.  No evidence for bacteremia, sepsis, no distress, no evidence for decompensated state.  Had requested  earlier removal of Foley catheter, now with demonstration to urinate, after being provided with return precautions including inability to continue to produce urine, patient discharged in stable condition.   MDM Rules/Calculators/A&P MDM Number of Diagnoses or Management Options Lower urinary tract infectious disease: new, needed workup   Amount and/or Complexity of Data Reviewed Clinical lab tests: ordered and reviewed Tests in the medicine section of CPT: reviewed and ordered Decide to obtain previous medical records or to obtain history from someone other than the patient: yes Obtain history from someone other than the patient: yes Review and summarize past medical records: yes  Risk of Complications, Morbidity, and/or Mortality Presenting problems: high Diagnostic procedures: high Management options: high  Critical Care Total time providing critical care: < 30 minutes  Patient Progress Patient progress: stable   Final Clinical Impression(s) / ED Diagnoses Final diagnoses:  Lower urinary tract infectious disease     Carmin Muskrat, MD 09/08/21 0006

## 2021-09-07 NOTE — Telephone Encounter (Signed)
Attempted call. NA 

## 2021-09-07 NOTE — ED Notes (Signed)
Patient able to void spontaneously, 337mL with some scant gross blood initially but then dark yellow.

## 2021-09-07 NOTE — Telephone Encounter (Signed)
Amy from [place] saw pt this week and she saw a discrepancy about his meds/ Hydralazine was on his list but his daughter said he hasnt been taking it / Amy would like to make sure if pt should be taking this or not/ please advise

## 2021-09-07 NOTE — ED Notes (Signed)
Patient states he still does not have the urge to void.

## 2021-09-08 MED ORDER — CEPHALEXIN 500 MG PO CAPS: 500.0000 mg | ORAL_CAPSULE | Freq: Two times a day (BID) | ORAL | 0 refills | Status: AC

## 2021-09-08 MED ORDER — CEPHALEXIN 250 MG PO CAPS
500.0000 mg | ORAL_CAPSULE | Freq: Once | ORAL | Status: AC
  Administered 2021-09-08: 500 mg via ORAL
  Filled 2021-09-08: qty 2

## 2021-09-08 NOTE — Discharge Instructions (Addendum)
As discussed, you have been diagnosed with a urinary tract infection.  It is important that you obtain and take your antibiotics.  Your next dose is tomorrow morning.  Please go to your pharmacy in the morning.  We have removed your catheter 3 days prior to the previously scheduled time for this.  If you develop any difficulty with urination it is important you return here or to another facility for a new Foley catheter.  If you develop new, or concerning changes do not hesitate to return here.

## 2021-09-10 ENCOUNTER — Other Ambulatory Visit: Payer: Medicare Other

## 2021-09-10 ENCOUNTER — Ambulatory Visit: Payer: Medicare Other | Admitting: Internal Medicine

## 2021-09-10 DIAGNOSIS — I82432 Acute embolism and thrombosis of left popliteal vein: Secondary | ICD-10-CM | POA: Diagnosis not present

## 2021-09-10 DIAGNOSIS — I82442 Acute embolism and thrombosis of left tibial vein: Secondary | ICD-10-CM | POA: Diagnosis not present

## 2021-09-10 DIAGNOSIS — I82412 Acute embolism and thrombosis of left femoral vein: Secondary | ICD-10-CM | POA: Diagnosis not present

## 2021-09-10 DIAGNOSIS — I82422 Acute embolism and thrombosis of left iliac vein: Secondary | ICD-10-CM | POA: Diagnosis not present

## 2021-09-10 NOTE — Telephone Encounter (Signed)
Tried returning call to Amy at [facility]. No answer and no vm. Okay for pec triage to give Amy verbal. Thanks.

## 2021-09-10 NOTE — Telephone Encounter (Signed)
Please review. Do you want the patient to be on hydralazine? He has an appt this week.

## 2021-09-10 NOTE — Telephone Encounter (Signed)
Tried returning call to Amy at [facility]. No answer and no vm.

## 2021-09-11 ENCOUNTER — Ambulatory Visit: Payer: Medicare Other | Admitting: Cardiology

## 2021-09-11 ENCOUNTER — Other Ambulatory Visit: Payer: Self-pay

## 2021-09-11 ENCOUNTER — Encounter: Payer: Self-pay | Admitting: Cardiology

## 2021-09-11 DIAGNOSIS — I10 Essential (primary) hypertension: Secondary | ICD-10-CM

## 2021-09-11 DIAGNOSIS — I350 Nonrheumatic aortic (valve) stenosis: Secondary | ICD-10-CM | POA: Insufficient documentation

## 2021-09-11 DIAGNOSIS — I82402 Acute embolism and thrombosis of unspecified deep veins of left lower extremity: Secondary | ICD-10-CM | POA: Diagnosis not present

## 2021-09-11 NOTE — Assessment & Plan Note (Signed)
Doing well with current medical management with amlodipine 10 mg as well as metoprolol 12.5 mg twice a day.

## 2021-09-11 NOTE — Assessment & Plan Note (Signed)
Left DVT lower extremity, extensive traversing of the iliac region.  He has an appointment coming up on October 7 with vascular surgery.  He does state that with Eliquis therapy it has improved but his left leg is still markedly edematous.  He has had prior PE back in in June 2022.

## 2021-09-11 NOTE — Assessment & Plan Note (Signed)
Moderate aortic stenosis seen on echocardiogram March 2022.  Personally reviewed.  Next year consider repeat echocardiogram.

## 2021-09-11 NOTE — Patient Instructions (Signed)
Medication Instructions:  The current medical regimen is effective;  continue present plan and medications.  *If you need a refill on your cardiac medications before your next appointment, please call your pharmacy*  Follow-Up: At [facility], you and your health needs are our priority.  As part of our continuing mission to provide you with exceptional heart care, we have created designated Provider Care Teams.  These Care Teams include your primary Cardiologist (physician) and Advanced Practice Providers (APPs -  Physician Assistants and Nurse Practitioners) who all work together to provide you with the care you need, when you need it.  We recommend signing up for the patient portal called "MyChart".  Sign up information is provided on this After Visit Summary.  MyChart is used to connect with patients for Virtual Visits (Telemedicine).  Patients are able to view lab/test results, encounter notes, upcoming appointments, etc.  Non-urgent messages can be sent to your provider as well.   To learn more about what you can do with MyChart, go to [email or web address].com.    Your next appointment:   1 year(s)  The format for your next appointment:   In Person  Provider:   Mark Skains, MD   Thank you for choosing [facility]!!    

## 2021-09-12 ENCOUNTER — Ambulatory Visit (INDEPENDENT_AMBULATORY_CARE_PROVIDER_SITE_OTHER): Payer: Medicare Other | Admitting: Family Medicine

## 2021-09-12 ENCOUNTER — Encounter: Payer: Self-pay | Admitting: Family Medicine

## 2021-09-12 VITALS — BP 110/65 | HR 74 | Temp 97.7°F | Resp 18 | Ht 68.0 in | Wt 187.0 lb

## 2021-09-12 DIAGNOSIS — R918 Other nonspecific abnormal finding of lung field: Secondary | ICD-10-CM | POA: Diagnosis not present

## 2021-09-12 DIAGNOSIS — Z9079 Acquired absence of other genital organ(s): Secondary | ICD-10-CM

## 2021-09-12 DIAGNOSIS — I82402 Acute embolism and thrombosis of unspecified deep veins of left lower extremity: Secondary | ICD-10-CM | POA: Diagnosis not present

## 2021-09-12 DIAGNOSIS — I10 Essential (primary) hypertension: Secondary | ICD-10-CM | POA: Diagnosis not present

## 2021-09-12 DIAGNOSIS — I2699 Other pulmonary embolism without acute cor pulmonale: Secondary | ICD-10-CM | POA: Diagnosis not present

## 2021-09-12 DIAGNOSIS — E7800 Pure hypercholesterolemia, unspecified: Secondary | ICD-10-CM

## 2021-09-12 DIAGNOSIS — I69354 Hemiplegia and hemiparesis following cerebral infarction affecting left non-dominant side: Secondary | ICD-10-CM

## 2021-09-12 DIAGNOSIS — G40909 Epilepsy, unspecified, not intractable, without status epilepticus: Secondary | ICD-10-CM | POA: Diagnosis not present

## 2021-09-12 NOTE — Progress Notes (Signed)
I,[name],acting as a scribe for [name], MD.,have documented all relevant documentation on the behalf of [name], MD,as directed by  [name], MD while in the presence of [name], MD.   Established patient visit   Patient: Travis Salazar.   DOB: 04-Apr-1936   85 y.o. Male  MRN: [id number] Visit Date: 09/12/2021  Today's healthcare provider: [name], MD   Chief Complaint  Patient presents with   Hospitalization Follow-up   Subjective    HPI  Patient has had 2 admissions since his last office visit.  He had seizure and acute respiratory failure followed by another DVT.  Another [place] issue is that his right lung mass the lower lobe has not been addressed.. Follow up Hospitalization  Patient was admitted to [facility] on 08/13/2021 and discharged on 08/23/2021.  He was treated for Status Epilepticus, Acute Respiratory Failure.  Treatment for this included; see notes in chart. Telephone follow up was done on none He reports good compliance with treatment. He reports this condition is improved.  -----------------------------------------------------------------------------------------   Patient states he feels much improved since hospital discharge.     Medications: Outpatient Medications Prior to Visit  Medication Sig   allopurinol (ZYLOPRIM) 300 MG tablet Take 300 mg by mouth daily as needed.   amLODipine (NORVASC) 10 MG tablet TAKE 1 TABLET BY MOUTH EVERY DAY   APIXABAN (ELIQUIS) VTE STARTER PACK (10MG  AND 5MG ) Take as directed on package: start with two-5mg  tablets twice daily for 7 days. On day 8, switch to one-5mg  tablet twice daily.   atorvastatin (LIPITOR) 40 MG tablet TAKE 1 TABLET (40 MG TOTAL) BY MOUTH DAILY AT 6 PM.   bethanechol (URECHOLINE) 10 MG tablet Take 1 tablet (10 mg total) by mouth 3 (three) times daily.   cephALEXin (KEFLEX) 500 MG capsule Take 1 capsule (500 mg total) by mouth 2 (two) times daily  for 5 days.   levETIRAcetam (KEPPRA) 1000 MG tablet Take 1 tablet (1,000 mg total) by mouth 2 (two) times daily.   metoprolol tartrate (LOPRESSOR) 25 MG tablet Take one-half tablet (12.5 mg total) by mouth 2 (two) times daily.   tamsulosin (FLOMAX) 0.4 MG CAPS capsule Take 1 capsule (0.4 mg total) by mouth daily.   [DISCONTINUED] colchicine 0.6 MG tablet Take 1 tablet (0.6 mg total) by mouth as needed (gout). (Patient not taking: Reported on 09/12/2021)   No facility-administered medications prior to visit.    Review of Systems  Constitutional:  Negative for appetite change, chills and fever.  Respiratory:  Negative for chest tightness, shortness of breath and wheezing.   Cardiovascular:  Negative for chest pain and palpitations.  Gastrointestinal:  Negative for abdominal pain, nausea and vomiting.       Objective    BP 110/65 (BP Location: Right Arm, Patient Position: Sitting, Cuff Size: Large)   Pulse 74   Temp 97.7 F (36.5 C) (Oral)   Resp 18   Ht 5\' 8"  (1.727 m)   Wt 187 lb (84.8 kg)   SpO2 97%   BMI 28.43 kg/m  BP Readings from Last 3 Encounters:  09/12/21 110/65  09/11/21 (!) 106/50  09/08/21 128/67   Wt Readings from Last 3 Encounters:  09/12/21 187 lb (84.8 kg)  09/11/21 187 lb (84.8 kg)  09/07/21 210 lb (95.3 kg)      Physical Exam Vitals reviewed.  Constitutional:      Appearance: He is well-developed.  HENT:  Head: Normocephalic and atraumatic.     Right Ear: External ear normal.     Left Ear: External ear normal.     Nose: Nose normal.  Eyes:     General: No scleral icterus.    Conjunctiva/sclera: Conjunctivae normal.  Neck:     Thyroid: No thyromegaly.  Cardiovascular:     Rate and Rhythm: Normal rate and regular rhythm.     Heart sounds: Normal heart sounds.  Pulmonary:     Effort: Pulmonary effort is normal.     Breath sounds: Normal breath sounds.  Abdominal:     Palpations: Abdomen is soft.  Musculoskeletal:     Right lower leg: No  edema.     Left lower leg: Edema present.     Comments: Left lower extremity with swelling to 2+ even above the knee.  Skin:    General: Skin is warm and dry.  Neurological:     Mental Status: He is alert and oriented to person, place, and time. Mental status is at baseline.  Psychiatric:        Mood and Affect: Mood normal.        Behavior: Behavior normal.        Thought Content: Thought content normal.        Judgment: Judgment normal.      No results found for any visits on 09/12/21.  Assessment & Plan     1. Acute deep vein thrombosis (DVT) of left lower extremity, unspecified vein (HCC) On Eliquis for life.  2. Hemiparesis affecting left side as late effect of cerebrovascular accident [facility]) Clinically this is stable at this time.  3. Seizure disorder [facility]) New issue, has neurology follow-up on October 6  4. Pulmonary mass Needs tissue diagnosis.  Surgical follow-up pending  5. Essential (primary) hypertension Good control - AMB [facility]  6. Hypercholesterolemia On atorvastatin 40 - AMB Referral to [place]  7. Acute pulmonary embolism without acute cor pulmonale, unspecified pulmonary embolism type (HCC) Clinically patient is breathing fine O2 sats 97% - AMB Referral to [facility]  8. S/P prostatectomy For prostate cancer  9. Right lower lobe lung mass Waiting for tissue diagnosis If patient does not want to follow through with getting tissue diagnosis need to entertain palliative care going forward.    Return in about 5 months (around 02/12/2022).      I, [name], MD, have reviewed all documentation for this visit. The documentation on 09/14/21 for the exam, diagnosis, procedures, and orders are all accurate and complete.    Maureena Dabbs Cranford Mon, MD  [facility] [phone] (phone) [phone] (fax)  [place]

## 2021-09-13 DIAGNOSIS — I82442 Acute embolism and thrombosis of left tibial vein: Secondary | ICD-10-CM | POA: Diagnosis not present

## 2021-09-13 DIAGNOSIS — I82412 Acute embolism and thrombosis of left femoral vein: Secondary | ICD-10-CM | POA: Diagnosis not present

## 2021-09-13 DIAGNOSIS — I82432 Acute embolism and thrombosis of left popliteal vein: Secondary | ICD-10-CM | POA: Diagnosis not present

## 2021-09-13 DIAGNOSIS — I82422 Acute embolism and thrombosis of left iliac vein: Secondary | ICD-10-CM | POA: Diagnosis not present

## 2021-09-14 ENCOUNTER — Other Ambulatory Visit: Payer: Self-pay

## 2021-09-14 ENCOUNTER — Encounter: Payer: Self-pay | Admitting: Emergency Medicine

## 2021-09-14 ENCOUNTER — Ambulatory Visit: Payer: Medicare Other | Admitting: Emergency Medicine

## 2021-09-14 VITALS — BP 106/68 | HR 71 | Temp 97.7°F | Ht 68.0 in | Wt 188.2 lb

## 2021-09-14 DIAGNOSIS — R918 Other nonspecific abnormal finding of lung field: Secondary | ICD-10-CM

## 2021-09-14 NOTE — Progress Notes (Signed)
Subjective:    Patient ID: Travis Salazar., male    DOB: June 19, 1936, 85 y.o.   MRN: [id number]  HPI 85 year old former smoker (50 pack years) with a history of hypertension and hyperlipidemia, CVA, pulmonary embolism diagnosed in June 2022. He had been followed for an enlarging right upper lobe pulmonary nodule and has seen Dr. Julien Nordmann, Dr. Roxan Hockey.  He wanted to be conservative and decided not to pursue surgical resection or biopsy in February 2021.  He was admitted with status epilepticus in August required endotracheal intubation.  MRI brain 08/14/2021 and then again on 08/22/2021 did not show any evidence of metastatic disease.  CT chest 05/27/2021 reviewed by me, shows multiple pulmonary emboli, no mediastinal or hilar lymphadenopathy, mild centrilobular emphysema, interval increase in size of a lobulated 3.5 x 1.9 cm peripheral right upper lobe mass with several scattered right pulmonary micronodules some of which are calcified.   Review of Systems As per HPI  Past Medical History:  Diagnosis Date   Hyperlipidemia    Hypertension    Stroke [facility])      Family History  Problem Relation Age of Onset   COPD Mother    Heart attack Father      Social History   Socioeconomic History   Marital status: Married    Spouse name: [name]   Number of children: 4   Years of education: 10th grade   Highest education level: 9th grade  Occupational History   Occupation: retired  Tobacco Use   Smoking status: Former    Packs/day: 1.00    Years: 50.00    Pack years: 50.00    Types: Cigarettes    Quit date: 12/15/2009    Years since quitting: 11.7   Smokeless tobacco: Never  Vaping Use   Vaping Use: Never used  Substance and Sexual Activity   Alcohol use: No    Alcohol/week: 0.0 standard drinks   Drug use: No   Sexual activity: Not on file  Other Topics Concern   Not on file  Social History Narrative   Not on file   Social Determinants of Health   Financial Resource  Strain: Not on file  Food Insecurity: Not on file  Transportation Needs: Not on file  Physical Activity: Not on file  Stress: Not on file  Social Connections: Not on file  Intimate Partner Violence: Not on file    Was exposed to auto-body paint [place] native  No Known Allergies   Outpatient Medications Prior to Visit  Medication Sig Dispense Refill   allopurinol (ZYLOPRIM) 300 MG tablet Take 300 mg by mouth daily as needed.     amLODipine (NORVASC) 10 MG tablet TAKE 1 TABLET BY MOUTH EVERY DAY 90 tablet 1   APIXABAN (ELIQUIS) VTE STARTER PACK (10MG  AND 5MG ) Take as directed on package: start with two-5mg  tablets twice daily for 7 days. On day 8, switch to one-5mg  tablet twice daily. 74 each 0   atorvastatin (LIPITOR) 40 MG tablet TAKE 1 TABLET (40 MG TOTAL) BY MOUTH DAILY AT 6 PM. 90 tablet 3   bethanechol (URECHOLINE) 10 MG tablet Take 1 tablet (10 mg total) by mouth 3 (three) times daily. 90 tablet 0   levETIRAcetam (KEPPRA) 1000 MG tablet Take 1 tablet (1,000 mg total) by mouth 2 (two) times daily. 60 tablet 0   metoprolol tartrate (LOPRESSOR) 25 MG tablet Take one-half tablet (12.5 mg total) by mouth 2 (two) times daily. 30 tablet 0   tamsulosin (FLOMAX) 0.4 MG  CAPS capsule Take 1 capsule (0.4 mg total) by mouth daily. 30 capsule 0   No facility-administered medications prior to visit.         Objective:   Physical Exam Vitals:   09/14/21 1458  BP: 106/68  Pulse: 71  Temp: 97.7 F (36.5 C)  TempSrc: Oral  SpO2: 96%  Weight: 188 lb 3.2 oz (85.4 kg)  Height: 5\' 8"  (1.727 m)    Gen: Pleasant, well-nourished, in no distress,  normal affect  ENT: No lesions,  mouth clear,  oropharynx clear, no postnasal drip  Neck: No JVD, no stridor  Lungs: No use of accessory muscles, no crackles or wheezing on normal respiration, no wheeze on forced expiration  Cardiovascular: RRR, heart sounds normal, no murmur or gallops, no peripheral edem  Musculoskeletal: No deformities, no  cyanosis or clubbing  Neuro: alert, awake, non focal  Skin: Warm, no lesions or rash      Assessment & Plan:  Right lower lobe lung mass I reviewed the CT scans with the patient and his daughter today.  Also discussed in detail our options for possible biopsy, possible treatment.  In particular discussed bronchoscopy versus TTNA to get a tissue diagnosis, possible future treatment including SBRT or even referral back to thoracic surgery (although I believe he may now be too high risk given his history of PE, seizures, superimposed on his other issues).  Also discussed watchful waiting with him.  He wants to continue to wait and watch, repeat his CT and then make a decision about whether to pursue a tissue diagnosis.  The 54-month mark would be December so we will repeat his CT scan of the chest then.  I did explain to him that he could develop symptoms and that he is at some risk for metastasis.  He understands.  We will review the CT chest in December after it is completed.  Time spent 60 minutes  [name], MD, PhD 09/14/2021, 5:06 PM [facility] [phone] or if no answer before 7:00PM call [phone] For any issues after 7:00PM please call eLink [phone]

## 2021-09-14 NOTE — Assessment & Plan Note (Signed)
I reviewed the CT scans with the patient and his daughter today.  Also discussed in detail our options for possible biopsy, possible treatment.  In particular discussed bronchoscopy versus TTNA to get a tissue diagnosis, possible future treatment including SBRT or even referral back to thoracic surgery (although I believe he may now be too high risk given his history of PE, seizures, superimposed on his other issues).  Also discussed watchful waiting with him.  He wants to continue to wait and watch, repeat his CT and then make a decision about whether to pursue a tissue diagnosis.  The 37-month mark would be December so we will repeat his CT scan of the chest then.  I did explain to him that he could develop symptoms and that he is at some risk for metastasis.  He understands.  We will review the CT chest in December after it is completed.

## 2021-09-14 NOTE — Patient Instructions (Signed)
We will plan to repeat your CT scan of the chest in December 2022 to follow your pulmonary nodule. Please call us if you develop any changes in breathing, chest pain, increased cough or blood in your sputum.  If you develop symptoms like these then we may decide to evaluate your pulmonary nodule sooner. Continue your medications as you have been taking them. Follow with Dr Lamonte Sakai in December after your CT scan so that we can review the results together.

## 2021-09-17 DIAGNOSIS — I82442 Acute embolism and thrombosis of left tibial vein: Secondary | ICD-10-CM | POA: Diagnosis not present

## 2021-09-17 DIAGNOSIS — I82432 Acute embolism and thrombosis of left popliteal vein: Secondary | ICD-10-CM | POA: Diagnosis not present

## 2021-09-17 DIAGNOSIS — I82422 Acute embolism and thrombosis of left iliac vein: Secondary | ICD-10-CM | POA: Diagnosis not present

## 2021-09-17 DIAGNOSIS — I82412 Acute embolism and thrombosis of left femoral vein: Secondary | ICD-10-CM | POA: Diagnosis not present

## 2021-09-18 ENCOUNTER — Ambulatory Visit (INDEPENDENT_AMBULATORY_CARE_PROVIDER_SITE_OTHER): Payer: Medicare Other | Admitting: *Deleted

## 2021-09-18 DIAGNOSIS — I82442 Acute embolism and thrombosis of left tibial vein: Secondary | ICD-10-CM | POA: Diagnosis not present

## 2021-09-18 DIAGNOSIS — I10 Essential (primary) hypertension: Secondary | ICD-10-CM

## 2021-09-18 DIAGNOSIS — E7800 Pure hypercholesterolemia, unspecified: Secondary | ICD-10-CM

## 2021-09-18 DIAGNOSIS — I82432 Acute embolism and thrombosis of left popliteal vein: Secondary | ICD-10-CM | POA: Diagnosis not present

## 2021-09-18 DIAGNOSIS — I82412 Acute embolism and thrombosis of left femoral vein: Secondary | ICD-10-CM | POA: Diagnosis not present

## 2021-09-18 DIAGNOSIS — I2699 Other pulmonary embolism without acute cor pulmonale: Secondary | ICD-10-CM

## 2021-09-18 DIAGNOSIS — G40909 Epilepsy, unspecified, not intractable, without status epilepticus: Secondary | ICD-10-CM

## 2021-09-18 DIAGNOSIS — I82422 Acute embolism and thrombosis of left iliac vein: Secondary | ICD-10-CM | POA: Diagnosis not present

## 2021-09-18 NOTE — Chronic Care Management (AMB) (Signed)
Chronic Care Management    Clinical Social Work Note  09/18/2021 Name: Travis Salazar. MRN: [id number] DOB: [birth year]  Travis Salazar. is a 85 y.o. year old male who is a primary care patient of [name], MD. The CCM team was consulted to assist the patient with chronic disease management and/or care coordination needs related to: [employer] .   Engaged with patient's daughter by telephone  for available resources for in home care supportinitial visit in response to provider referral for social work chronic care management and care coordination services.   Consent to Services:  The patient was given the following information about Chronic Care Management services today, agreed to services, and gave verbal consent: 1. CCM service includes personalized support from designated clinical staff supervised by the primary care provider, including individualized plan of care and coordination with other care providers 2. 24/7 contact phone numbers for assistance for urgent and routine care needs. 3. Service will only be billed when office clinical staff spend 20 minutes or more in a month to coordinate care. 4. Only one practitioner may furnish and bill the service in a calendar month. 5.The patient may stop CCM services at any time (effective at the end of the month) by phone call to the office staff. 6. The patient will be responsible for cost sharing (co-pay) of up to 20% of the service fee (after annual deductible is met). Patient agreed to services and consent obtained.  Patient agreed to services and consent obtained.   Assessment: Review of patient past medical history, allergies, medications, and health status, including review of relevant consultants reports was performed today as part of a comprehensive evaluation and provision of chronic care management and care coordination services.     SDOH (Social Determinants of Health) assessments and interventions performed:     Advanced Directives Status: Not addressed in this encounter.  CCM Care Plan  No Known Allergies  Outpatient Encounter Medications as of 09/18/2021  Medication Sig   allopurinol (ZYLOPRIM) 300 MG tablet Take 300 mg by mouth daily as needed.   amLODipine (NORVASC) 10 MG tablet TAKE 1 TABLET BY MOUTH EVERY DAY   APIXABAN (ELIQUIS) VTE STARTER PACK (10MG AND 5MG) Take as directed on package: start with two-78m tablets twice daily for 7 days. On day 8, switch to one-534mtablet twice daily.   atorvastatin (LIPITOR) 40 MG tablet TAKE 1 TABLET (40 MG TOTAL) BY MOUTH DAILY AT 6 PM.   bethanechol (URECHOLINE) 10 MG tablet Take 1 tablet (10 mg total) by mouth 3 (three) times daily.   levETIRAcetam (KEPPRA) 1000 MG tablet Take 1 tablet (1,000 mg total) by mouth 2 (two) times daily.   metoprolol tartrate (LOPRESSOR) 25 MG tablet Take one-half tablet (12.5 mg total) by mouth 2 (two) times daily.   tamsulosin (FLOMAX) 0.4 MG CAPS capsule Take 1 capsule (0.4 mg total) by mouth daily.   No facility-administered encounter medications on file as of 09/18/2021.    Patient Active Problem List   Diagnosis Date Noted   Aortic stenosis 09/11/2021   Seizure disorder (HCE[place]09/14/2022   DVT (deep venous thrombosis) (HCC[place]09/09/2021   Acute urinary retention 08/18/2021   Status epilepticus (HCL[place]08/29/2022   Acute pulmonary embolism without acute cor pulmonale (HCC) 05/27/2021   Demand ischemia (HCE[place]06/11/2021   CAD (coronary artery disease) 05/27/2021   Emphysema lung (HCM[place]06/11/2021   Acute left hemiparesis (HCR[place]05/16/2021   Paroxysmal supraventricular tachycardia (HCTwin05/16/2021   Right lower  lobe lung mass 01/07/2020   CVA (cerebral vascular accident) ([place]) 06/26/2019   Wrist swelling, right 06/03/2017   S/P prostatectomy 10/08/2016   Snores 10/08/2016   Essential (primary) hypertension 09/27/2015   History of tobacco use 09/27/2015   Hypercholesterolemia 09/27/2015   Blood glucose elevated  09/27/2015    Conditions to be addressed/monitored:  Essential (primary) hypertension     Hypercholesterolemia     Acute pulmonary embolism without acute cor pulmonale, unspecified pulmonary embolism type ([place])     ;Limited social support and ADL IADL limitations  Care Plan : General Social Work (Adult)  Updates made by [employer], Darla Lesches, LCSW since 09/18/2021 12:00 AM     Problem: CHL AMB "PATIENT-SPECIFIC PROBLEM"   Note:   CARE PLAN ENTRY (see longitudinal plan of care for additional care plan information)  Current Barriers:  Patient with HTN in need of assistance with connection to community resources  Knowledge deficits and need for support, education and care coordination related to community resources support  ADL IADL limitations and Limited access to caregiver  Clinical Goal(s)  Over the next 90 days, patient will work with the Department of Human Services to follow up on referral for CAP services  Interventions provided by LCSW:  Assessed patient's care coordination needs related to need for in home support due to ADL and IADL limitations-discussed ongoing care management follow up  Patient's daughter confirmed that she is the primary caregiver, however she lives in [place], patient will need care when daughter is not available due to patient's multiple medical conditions and physical limitiations Provided patient with information about CAP/DA services to assess the possibility for additional in home support Collaborated with patient's provider regarding completion of  level of care request worksheet for CAP services faxed to the Department of Human Services [phone] Advised patient to contact this social worker with any correspondence received Depression screen reviewed  PHQ2/ PHQ9 completed Active listening / Reflection utilized  Caregiver stress achnowledged   Patient Self Care Activities & Deficits:  Patient is unable to independently navigate community  resource options without care coordination support  Acknowledges deficits and is motivated to resolve concern  Unable to perform ADLs independently Unable to perform IADLs independently Patient's daughter available for support, however lives in [place], [place]  Initial goal documentation        Follow Up Plan: SW will follow up with patient by phone over the next 14 business days       [place], [place] Worker  [facility] Care Management [phone]

## 2021-09-18 NOTE — Patient Instructions (Signed)
Visit Information  PATIENT GOALS:   Goals Addressed             This Visit's Progress    Find Help in My Community          Mr. Spang was given information about Care Management services by the embedded care coordination team including:  Care Management services include personalized support from designated clinical staff supervised by his physician, including individualized plan of care and coordination with other care providers 24/7 contact phone numbers for assistance for urgent and routine care needs. The patient may stop CCM services at any time (effective at the end of the month) by phone call to the office staff.  Patient agreed to services and verbal consent obtained.   The patient verbalized understanding of instructions, educational materials, and care plan provided today and declined offer to receive copy of patient instructions, educational materials, and care plan.   Telephone follow up appointment with care management team member scheduled for: 10/02/21   Elliot Gurney, [place] Worker  [facility] [phone]

## 2021-09-20 ENCOUNTER — Telehealth (HOSPITAL_COMMUNITY): Payer: Self-pay

## 2021-09-20 ENCOUNTER — Ambulatory Visit: Payer: Medicare Other | Admitting: Neurology

## 2021-09-20 ENCOUNTER — Encounter: Payer: Self-pay | Admitting: Neurology

## 2021-09-20 ENCOUNTER — Other Ambulatory Visit: Payer: Self-pay

## 2021-09-20 ENCOUNTER — Other Ambulatory Visit (HOSPITAL_COMMUNITY): Payer: Self-pay

## 2021-09-20 VITALS — BP 111/69 | HR 93 | Ht 68.0 in | Wt 192.6 lb

## 2021-09-20 DIAGNOSIS — G40201 Localization-related (focal) (partial) symptomatic epilepsy and epileptic syndromes with complex partial seizures, not intractable, with status epilepticus: Secondary | ICD-10-CM | POA: Diagnosis not present

## 2021-09-20 DIAGNOSIS — Z8673 Personal history of transient ischemic attack (TIA), and cerebral infarction without residual deficits: Secondary | ICD-10-CM

## 2021-09-20 MED ORDER — LEVETIRACETAM 1000 MG PO TABS: 1000.0000 mg | ORAL_TABLET | Freq: Two times a day (BID) | ORAL | 3 refills | Status: DC

## 2021-09-20 NOTE — Patient Instructions (Signed)
Good to meet you. Continue Keppra 1000mg  twice a day. Follow-up in 6 months, call for any changes   Seizure Precautions: 1. If medication has been prescribed for you to prevent seizures, take it exactly as directed.  Do not stop taking the medicine without talking to your doctor first, even if you have not had a seizure in a long time.   2. Avoid activities in which a seizure would cause danger to yourself or to others.  Don't operate dangerous machinery, swim alone, or climb in high or dangerous places, such as on ladders, roofs, or girders.  Do not drive unless your doctor says you may.  3. If you have any warning that you may have a seizure, lay down in a safe place where you can't hurt yourself.    4.  No driving for 6 months from last seizure, as per [facility].   Please refer to the following link on the [place] website for more information: http://[email or web address].org/[email or web address]/drivingu.cfm   5.  Maintain good sleep hygiene.  6.  Contact your doctor if you have any problems that may be related to the medicine you are taking.  7.  Call 911 and bring the patient back to the ED if:        A.  The seizure lasts longer than 5 minutes.       B.  The patient doesn't awaken shortly after the seizure  C.  The patient has new problems such as difficulty seeing, speaking or moving  D.  The patient was injured during the seizure  E.  The patient has a temperature over 102 F (39C)  F.  The patient vomited and now is having trouble breathing

## 2021-09-20 NOTE — Progress Notes (Signed)
NEUROLOGY CONSULTATION NOTE  Travis Salazar. MRN: [id number] DOB: 12-18-1935  Referring provider: Dr. Fayrene Helper Primary care provider: Dr. Miguel Aschoff  Reason for consult:  transfer of care for epilepsy  Dear Dr Florene Glen:  Thank you for your kind referral of Travis Salazar. for consultation of the above symptoms. Although his history is well known to you, please allow me to reiterate it for the purpose of our medical record. The patient was accompanied to the clinic by daughter Travis Salazar who also provides collateral information. Records and images were personally reviewed where available.   HISTORY OF PRESENT ILLNESS: This is an 85 year old right-handed man with a history of hypertension, hyperlipidemia, PE on Eliquis, prostate cancer, prior R MCA stroke, presenting to establish care for new onset seizures. Records from his prior neurologist Dr. Leonie Man and hospital records were reviewed. His last visit with [facility] was in 11/2019. He was [facility] to [facility] on [facility]/13/21, He recalls feeling dizzy, "something just was not right, he could talk and told his wife to call EMS and words were slurred. EMS notes indicated left-sided weakness with note of left facial and arm twitching on EMS arrival.  Head CT no acute changes, he was started on Dilantin and given repeated doses of Ativan due to continued focal seizure activity. As seizure activity increased, he was unable to move left arm at all, able to lift right arm without drift. EEG showed LPDs on the right hemisphere, maximal right temporal region with some evolution in frequency but no definite seizure. Background showed diffuse slowing and lateralized right hemisphere slowing. MRI brain no acute changes, right MCA encephalomalacia with chronic blood products. Follow-up EEG showed right temporal sharp waves. He had an MRI brain on 08/22/21 for vision changes, MRI brain no change from prior. He had an extensive DVT and known right lower lobe lung mass which he  declined further workup on.   He lives with his wife, his daughter has been staying with them since hospitalization, managing medications. She denies any further seizures since 08/13/21, no staring/unresponsive episodes. He feels his left leg is 90% better. He has glaucoma with blurred vision and bilateral loss of peripheral vision. He denies any olfactory/gustatory hallucinations, deja vu, rising epigastric sensation, focal tingling/weakness, myoclonic jerks. He denies any headaches, dizziness, neck/back pain, bowel/bladder dysfunction. He has been using a cane since hospital discharge, no falls. Sleep is good. No side effects on Levetiracetam 1000mg  BID. Mood is good, however his daughter reports he is "moody."   Epilepsy Risk Factors:  right MCA encephalomalacia. Otherwise he had a normal birth and early development.  There is no history of febrile convulsions, CNS infections such as meningitis/encephalitis, significant traumatic brain injury, neurosurgical procedures, or family history of seizures.    PAST MEDICAL HISTORY: Past Medical History:  Diagnosis Date   Hyperlipidemia    Hypertension    Stroke [facility])     PAST SURGICAL HISTORY: Past Surgical History:  Procedure Laterality Date   LIPOMA EXCISION     neck   PROSTATECTOMY     due to prostate cancer    MEDICATIONS: Current Outpatient Medications on File Prior to Visit  Medication Sig Dispense Refill   allopurinol (ZYLOPRIM) 300 MG tablet Take 300 mg by mouth daily as needed.     amLODipine (NORVASC) 10 MG tablet TAKE 1 TABLET BY MOUTH EVERY DAY 90 tablet 1   APIXABAN (ELIQUIS) VTE STARTER PACK (10MG  AND 5MG ) Take as directed on package: start with two-5mg  tablets  twice daily for 7 days. On day 8, switch to one-5mg  tablet twice daily. 74 each 0   atorvastatin (LIPITOR) 40 MG tablet TAKE 1 TABLET (40 MG TOTAL) BY MOUTH DAILY AT 6 PM. 90 tablet 3   bethanechol (URECHOLINE) 10 MG tablet Take 1 tablet (10 mg total) by mouth 3 (three)  times daily. 90 tablet 0   levETIRAcetam (KEPPRA) 1000 MG tablet Take 1 tablet (1,000 mg total) by mouth 2 (two) times daily. 60 tablet 0   metoprolol tartrate (LOPRESSOR) 25 MG tablet Take one-half tablet (12.5 mg total) by mouth 2 (two) times daily. 30 tablet 0   tamsulosin (FLOMAX) 0.4 MG CAPS capsule Take 1 capsule (0.4 mg total) by mouth daily. 30 capsule 0   No current facility-administered medications on file prior to visit.    ALLERGIES: No Known Allergies  FAMILY HISTORY: Family History  Problem Relation Age of Onset   COPD Mother    Heart attack Father     SOCIAL HISTORY: Social History   Socioeconomic History   Marital status: Married    Spouse name: Travis Salazar   Number of children: 4   Years of education: 10th grade   Highest education level: 9th grade  Occupational History   Occupation: retired  Tobacco Use   Smoking status: Former    Packs/day: 1.00    Years: 50.00    Pack years: 50.00    Types: Cigarettes    Quit date: 12/15/2009    Years since quitting: 11.7   Smokeless tobacco: Never  Vaping Use   Vaping Use: Never used  Substance and Sexual Activity   Alcohol use: No    Alcohol/week: 0.0 standard drinks   Drug use: No   Sexual activity: Not on file  Other Topics Concern   Not on file  Social History Narrative   Not on file   Social Determinants of Health   Financial Resource Strain: Low Risk    Difficulty of Paying Living Expenses: Not hard at all  Food Insecurity: No Food Insecurity   Worried About Charity fundraiser in the Last Year: Never true   [place] in the Last Year: Never true  Transportation Needs: No Transportation Needs   Lack of Transportation (Medical): No   Lack of Transportation (Non-Medical): No  Physical Activity: Insufficiently Active   Days of Exercise per Week: 7 days   Minutes of Exercise per Session: 20 min  Stress: No Stress Concern Present   Feeling of Stress : Not at all  Social Connections: Moderately  Integrated   Frequency of Communication with Friends and Family: More than three times a week   Frequency of Social Gatherings with Friends and Family: More than three times a week   Attends Religious Services: More than 4 times per year   Active Member of [employer] or Organizations: No   Attends Archivist Meetings: Never   Marital Status: Married  Human resources officer Violence: Not on file     PHYSICAL EXAM: Vitals:   09/20/21 0853  BP: 111/69  Pulse: 93  SpO2: 98%   General: No acute distress Head:  Normocephalic/atraumatic Skin/Extremities: No rash, no edema Neurological Exam: Mental status: alert and oriented to person, place, year. States it is 08/22/21. No dysarthria or aphasia, Fund of knowledge is appropriate.  Attention and concentration are reduced, 0/5 serial 7s.  Cranial nerves: CN I: not tested CN II: pupils equal, round and reactive to light, loss of peripheral vision on both  eyes CN III, IV, VI:  full range of motion, no nystagmus, no ptosis CN V: facial sensation intact CN VII: upper and lower face symmetric CN VIII: hearing intact to conversation Bulk & Tone: normal, no fasciculations. Motor: 5/5 throughout with no pronator drift. Sensation: intact to light touch, cold, pin, vibration and joint position sense.  No extinction to double simultaneous stimulation.  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 EEG showed LPDs arising from the right temporal region/right hemisphere. MRI no acute changes, right MCA encephalomalacia. He has  been seizure-free since 08/13/21 on Levetiracetam 1000mg  BID. [place] driving laws were discussed with the patient, and he knows to stop driving after a seizure, until 6 months seizure-free. Vision also precludes driving. Follow-up in 6 months, they know to call for any changes.   Thank you for allowing me to participate in the care of this patient. Please do not hesitate to call for any questions or concerns.   Ellouise Newer, M.D.  CC: Dr. Florene Glen, Dr. Rosanna Randy

## 2021-09-20 NOTE — Telephone Encounter (Signed)
Transitions of Care Pharmacy  ° °Call attempted for a pharmacy transitions of care follow-up. HIPAA appropriate voicemail was left with call back information provided.  ° °Call attempt #1. Will follow-up in 2-3 days.  °  °

## 2021-09-21 ENCOUNTER — Telehealth (HOSPITAL_COMMUNITY): Payer: Self-pay

## 2021-09-21 ENCOUNTER — Encounter: Payer: Medicare Other | Admitting: Vascular Surgery

## 2021-09-21 ENCOUNTER — Other Ambulatory Visit (HOSPITAL_COMMUNITY): Payer: Self-pay

## 2021-09-21 ENCOUNTER — Other Ambulatory Visit: Payer: Self-pay | Admitting: Family Medicine

## 2021-09-21 MED ORDER — TAMSULOSIN HCL 0.4 MG PO CAPS: 0.4000 mg | ORAL_CAPSULE | Freq: Every day | ORAL | 1 refills | Status: AC

## 2021-09-21 MED ORDER — METOPROLOL TARTRATE 25 MG PO TABS
12.5000 mg | ORAL_TABLET | Freq: Two times a day (BID) | ORAL | 1 refills | Status: DC
Start: 2021-09-21 — End: 2022-03-14

## 2021-09-21 NOTE — Telephone Encounter (Signed)
Pharmacy Transitions of Care Follow-up Telephone Call  Date of discharge: 08/30/21  Discharge Diagnosis: DVT  How have you been since you were released from the hospital?  Spoke with [name] (spouse). Patient doing well since discharge. Spoke with spouse to get patient's refills from PCP. Patient will call today to have refills sent to home pharmacy.  Medication changes made at discharge:  - START: Eliquis Starter pack  Medication changes verified by the patient? Yes    Medication Accessibility:  Home Pharmacy: not discussed   Was the patient provided with refills on discharged medications? No   Have all prescriptions been transferred from [facility] to home pharmacy? N/A   Is the patient able to afford medications? Has insurance    Medication Review:  APIXABAN (ELIQUIS)  Apixaban 10 mg BID initiated on 08/30/21. Will switch to apixaban 5 mg BID after 7 days (DATE 09/06/21).  - Discussed importance of taking medication around the same time everyday  - Reviewed potential DDIs with patient  - Advised patient of medications to avoid (NSAIDs, ASA)  - Educated that Tylenol (acetaminophen) will be the preferred analgesic to prevent risk of bleeding  - Emphasized importance of monitoring for signs and symptoms of bleeding (abnormal bruising, prolonged bleeding, nose bleeds, bleeding from gums, discolored urine, black tarry stools)  - Advised patient to alert all providers of anticoagulation therapy prior to starting a new medication or having a procedure    Follow-up Appointments:  PCP Hospital f/u appt confirmed?  Scheduled to see Dr. Rosanna Randy on 09/12/21 @ Fam Med.   [facility] f/u appt confirmed? Scheduled to see Dr. Virl Cagey on 09/21/21 @ Vascular Surgery.   If their condition worsens, is the pt aware to call PCP or go to the Emergency Dept.? yes  Final Patient Assessment: Patient has follow up scheduled and knows to get refills at follow up

## 2021-09-21 NOTE — Telephone Encounter (Signed)
Medication: Rx #: [id number] tamsulosin (FLOMAX) 0.4 MG CAPS capsule [[id number]] , Rx #: [id number] metoprolol tartrate (LOPRESSOR) 25 MG tablet [[id number]] , Rx #: [id number] metoprolol tartrate (LOPRESSOR) 25 MG tablet [[id number]] ,      Has the patient contacted their pharmacy? YES  (Agent: If no, request that the patient contact the pharmacy for the refill.) (Agent: If yes, when and what did the pharmacy advise?)    Preferred Pharmacy (with phone number or street name): [id number] [place], [address] [place] [address] Phone: [phone] Fax: [phone] Hours: Not open 24 hours Has the patient been seen for an appointment in the last year OR does the patient have an upcoming appointment? YES 09/12/21 Hospitalization Follow-up  Agent: Please be advised that RX refills may take up to 3 business days. We ask that you follow-up with your pharmacy.

## 2021-09-27 DIAGNOSIS — I82412 Acute embolism and thrombosis of left femoral vein: Secondary | ICD-10-CM | POA: Diagnosis not present

## 2021-09-27 DIAGNOSIS — I82422 Acute embolism and thrombosis of left iliac vein: Secondary | ICD-10-CM | POA: Diagnosis not present

## 2021-09-27 DIAGNOSIS — I82442 Acute embolism and thrombosis of left tibial vein: Secondary | ICD-10-CM | POA: Diagnosis not present

## 2021-09-27 DIAGNOSIS — I82432 Acute embolism and thrombosis of left popliteal vein: Secondary | ICD-10-CM | POA: Diagnosis not present

## 2021-09-28 ENCOUNTER — Encounter: Payer: Self-pay | Admitting: Vascular Surgery

## 2021-09-28 ENCOUNTER — Other Ambulatory Visit: Payer: Self-pay

## 2021-09-28 ENCOUNTER — Ambulatory Visit: Payer: Medicare Other | Admitting: Vascular Surgery

## 2021-09-28 VITALS — BP 118/72 | HR 82 | Temp 97.7°F | Resp 18 | Ht 68.0 in | Wt 189.0 lb

## 2021-09-28 DIAGNOSIS — I82422 Acute embolism and thrombosis of left iliac vein: Secondary | ICD-10-CM | POA: Diagnosis not present

## 2021-09-28 NOTE — Progress Notes (Signed)
Office Note     CC: Left lower extremity swelling Requesting Provider:  Jerrol Salazar.,*  HPI: Travis Salazar. is a 85 y.o. (1936/01/16) male presenting at the request of .Travis Salazar., MD recommendations regarding left leg iliofemoral DVT.  Patient presents accompanied by his daughter.  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  Daughter commutes from [place] in an effort to provide care.  Remains active at his home and lives independently.  His daughter first appreciated swelling 2 weeks ago in the left lower extremity prompting a visit to the ED.  There, ultrasound demonstrated iliofemoral DVT.  The patient was started on a NOAC.  He has a previous history of DVT PE, and came off anticoagulation due to medications running out.  The new left lower extremity DVT occurred while off of his medication.  Since in the hospital, he has been doing much better.  The initial swelling was painful, and limited ambulation.  Now, his thigh has become much softer.  He still complains of pain in the calf.  He has not used compression stockings.  The pt is is on a statin for cholesterol management.  The pt is none on a daily aspirin.   Other AC:  Eliquis The pt is on medications for hypertension.   The pt is not diabetic.  Tobacco hx:  former  Past Medical History:  Diagnosis Date   Hyperlipidemia    Hypertension    Stroke [facility])     Past Surgical History:  Procedure Laterality Date   LIPOMA EXCISION     neck   PROSTATECTOMY     due to prostate cancer    Social History   Socioeconomic History   Marital status: Married    Spouse name: Travis Salazar   Number of children: 4   Years of education: 10th grade   Highest education level: 9th grade  Occupational History   Occupation: retired  Tobacco Use   Smoking status: Former    Packs/day: 1.00    Years: 50.00    Pack years: 50.00    Types: Cigarettes    Quit date: 12/15/2009    Years since quitting:  11.7   Smokeless tobacco: Never  Vaping Use   Vaping Use: Never used  Substance and Sexual Activity   Alcohol use: No    Alcohol/week: 0.0 standard drinks   Drug use: No   Sexual activity: Not on file  Other Topics Concern   Not on file  Social History Narrative   Right handed    Social Determinants of Health   Financial Resource Strain: Low Risk    Difficulty of Paying Living Expenses: Not hard at all  Food Insecurity: No Food Insecurity   Worried About Charity fundraiser in the Last Year: Never true   Emigrant in the Last Year: Never true  Transportation Needs: No Transportation Needs   Lack of Transportation (Medical): No   Lack of Transportation (Non-Medical): No  Physical Activity: Insufficiently Active   Days of Exercise per Week: 7 days   Minutes of Exercise per Session: 20 min  Stress: No Stress Concern Present   Feeling of Stress : Not at all  Social Connections: Moderately Integrated   Frequency of Communication with Friends and Family: More than three times a week   Frequency of Social Gatherings with Friends and Family: More than three times a week   Attends Religious Services: More than 4 times per  year   Active Member of Clubs or Organizations: No   Attends Archivist Meetings: Never   Marital Status: Married  Human resources officer Violence: Not on file    Family History  Problem Relation Age of Onset   COPD Mother    Heart attack Father     Current Outpatient Medications  Medication Sig Dispense Refill   allopurinol (ZYLOPRIM) 300 MG tablet Take 300 mg by mouth daily as needed.     amLODipine (NORVASC) 10 MG tablet TAKE 1 TABLET BY MOUTH EVERY DAY 90 tablet 1   APIXABAN (ELIQUIS) VTE STARTER PACK (10MG  AND 5MG ) Take as directed on package: start with two-5mg  tablets twice daily for 7 days. On day 8, switch to one-5mg  tablet twice daily. 74 each 0   atorvastatin (LIPITOR) 40 MG tablet TAKE 1 TABLET (40 MG TOTAL) BY MOUTH DAILY AT 6 PM. 90  tablet 3   bethanechol (URECHOLINE) 10 MG tablet Take 10 mg by mouth 3 (three) times daily.     levETIRAcetam (KEPPRA) 1000 MG tablet Take 1 tablet (1,000 mg total) by mouth 2 (two) times daily. 180 tablet 3   metoprolol tartrate (LOPRESSOR) 25 MG tablet Take one-half tablet (12.5 mg total) by mouth 2 (two) times daily. 180 tablet 1   tamsulosin (FLOMAX) 0.4 MG CAPS capsule Take 1 capsule (0.4 mg total) by mouth daily. 90 capsule 1   No current facility-administered medications for this visit.    No Known Allergies   REVIEW OF SYSTEMS:   [X]  denotes positive finding, [ ]  denotes negative finding Cardiac  Comments:  Chest pain or chest pressure:    Shortness of breath upon exertion:    Short of breath when lying flat:    Irregular heart rhythm:        Vascular    Pain in calf, thigh, or hip brought on by ambulation:    Pain in feet at night that wakes you up from your sleep:     Blood clot in your veins:    Leg swelling:  X       Pulmonary    Oxygen at home:    Productive cough:     Wheezing:         Neurologic    Sudden weakness in arms or legs:     Sudden numbness in arms or legs:     Sudden onset of difficulty speaking or slurred speech:    Temporary loss of vision in one eye:     Problems with dizziness:         Gastrointestinal    Blood in stool:     Vomited blood:         Genitourinary    Burning when urinating:     Blood in urine:        Psychiatric    Major depression:         Hematologic    Bleeding problems:    Problems with blood clotting too easily:        Skin    Rashes or ulcers:        Constitutional    Fever or chills:      PHYSICAL EXAMINATION:  Vitals:   09/28/21 1126  BP: 118/72  Pulse: 82  Resp: 18  Temp: 97.7 F (36.5 C)  TempSrc: Temporal  SpO2: 98%  Weight: 189 lb (85.7 kg)  Height: 5\' 8"  (1.727 m)    General:  WDWN in NAD; vital signs documented above Gait:  Not observed HENT: WNL, normocephalic Pulmonary: normal  non-labored breathing , without Rales, rhonchi,  wheezing Cardiac: regular HR,  Abdomen: soft, NT, no masses Skin: without rashes Vascular Exam/Pulses:  Right Left  Radial 2+ (normal) 2+ (normal)  Ulnar 2+ (normal) 2+ (normal)  Femoral    Popliteal    DP 2+ (normal) trace  PT 1+ (weak) 1+ (weak)   Extremities: without ischemic changes, without Gangrene , without cellulitis; without open wounds;  Musculoskeletal: no muscle wasting or atrophy  Neurologic: A&O X 3;  No focal weakness or paresthesias are detected Psychiatric:  The pt has Normal affect.   Non-Invasive Vascular Imaging:   Noninvasive vascular imaging was reviewed demonstrating left iliofemoral DVT.    ASSESSMENT/PLAN:: 85 y.o. male presenting with acute iliofemoral DVT.  Per daughter, patient has had a previous history of DVT, resulting in pulmonary embolism.  Also has an un classified lung mass.  The most recent DVT occurred when off of anticoagulation.  This is not a failure of anticoagulation, however the trial off was unsuccessful, and therefore the patient should be on anticoagulation lifelong.  I discussed the role of mechanical thrombectomy, however [place] has improved significantly since his initial presentation to the hospital.  His current DVT is not lifestyle limiting.  After discussing the risks and benefits, we elected to treat his DVT with medical management.  He was fitted for compression stockings while in the office.  We will see the patient in 3 months to assess the left lower extremity, as well as ABIs as the patient had a weakly palpable pulse in the left lower extremity.   Broadus John, MD [facility] [phone]

## 2021-10-02 ENCOUNTER — Ambulatory Visit: Payer: Medicare Other | Admitting: *Deleted

## 2021-10-02 ENCOUNTER — Other Ambulatory Visit: Payer: Self-pay | Admitting: Family Medicine

## 2021-10-02 DIAGNOSIS — E7800 Pure hypercholesterolemia, unspecified: Secondary | ICD-10-CM

## 2021-10-02 DIAGNOSIS — I10 Essential (primary) hypertension: Secondary | ICD-10-CM

## 2021-10-02 DIAGNOSIS — I82422 Acute embolism and thrombosis of left iliac vein: Secondary | ICD-10-CM | POA: Diagnosis not present

## 2021-10-02 DIAGNOSIS — I82432 Acute embolism and thrombosis of left popliteal vein: Secondary | ICD-10-CM | POA: Diagnosis not present

## 2021-10-02 DIAGNOSIS — I82412 Acute embolism and thrombosis of left femoral vein: Secondary | ICD-10-CM | POA: Diagnosis not present

## 2021-10-02 DIAGNOSIS — I2699 Other pulmonary embolism without acute cor pulmonale: Secondary | ICD-10-CM

## 2021-10-02 DIAGNOSIS — I82442 Acute embolism and thrombosis of left tibial vein: Secondary | ICD-10-CM | POA: Diagnosis not present

## 2021-10-02 NOTE — Telephone Encounter (Signed)
Requested medications are due for refill today.  yes  Requested medications are on the active medications list.  yes  Last refill. 09/28/2021  Future visit scheduled.   With Pharmacy  Notes to clinic.  Historical medication.

## 2021-10-02 NOTE — Telephone Encounter (Signed)
Medication Refill - Medication: bethanechol 10 mg  This medication was prescribed by the hospital while he was there.  Has the patient contacted their pharmacy? No. (Agent: If no, request that the patient contact the pharmacy for the refill.) (Agent: If yes, when and what did the pharmacy advise?)  Preferred Pharmacy (with phone number or street name): [facility] Has the patient been seen for an appointment in the last year OR does the patient have an upcoming appointment? Yes.    Agent: Please be advised that RX refills may take up to 3 business days. We ask that you follow-up with your pharmacy.

## 2021-10-03 ENCOUNTER — Other Ambulatory Visit: Payer: Self-pay

## 2021-10-03 DIAGNOSIS — I82422 Acute embolism and thrombosis of left iliac vein: Secondary | ICD-10-CM

## 2021-10-03 NOTE — Chronic Care Management (AMB) (Addendum)
Chronic Care Management    Clinical Social Work Note  10/03/2021 Name: Travis Salazar. MRN: [id number] DOB: 1936/12/05  Travis Salazar. is a 85 y.o. year old male who is a primary care patient of [name], MD. The CCM team was consulted to assist the patient with chronic disease management and/or care coordination needs related to: Community Resources  and Level of Care Concerns.   Collaboration with patient's daughter  for follow up visit in response to provider referral for social work chronic care management and care coordination services.   Consent to Services:  The patient was given information about Chronic Care Management services, agreed to services, and gave verbal consent prior to initiation of services.  Please see initial visit note for detailed documentation.   Patient agreed to services and consent obtained.   Assessment: Review of patient past medical history, allergies, medications, and health status, including review of relevant consultants reports was performed today as part of a comprehensive evaluation and provision of chronic care management and care coordination services.     SDOH (Social Determinants of Health) assessments and interventions performed:    Advanced Directives Status: Not addressed in this encounter.  CCM Care Plan  No Known Allergies  Outpatient Encounter Medications as of 10/02/2021  Medication Sig   allopurinol (ZYLOPRIM) 300 MG tablet Take 300 mg by mouth daily as needed.   amLODipine (NORVASC) 10 MG tablet TAKE 1 TABLET BY MOUTH EVERY DAY   APIXABAN (ELIQUIS) VTE STARTER PACK (10MG  AND 5MG ) Take as directed on package: start with two-5mg  tablets twice daily for 7 days. On day 8, switch to one-5mg  tablet twice daily.   atorvastatin (LIPITOR) 40 MG tablet TAKE 1 TABLET (40 MG TOTAL) BY MOUTH DAILY AT 6 PM.   bethanechol (URECHOLINE) 10 MG tablet Take 10 mg by mouth 3 (three) times daily.   levETIRAcetam (KEPPRA) 1000 MG tablet  Take 1 tablet (1,000 mg total) by mouth 2 (two) times daily.   metoprolol tartrate (LOPRESSOR) 25 MG tablet Take one-half tablet (12.5 mg total) by mouth 2 (two) times daily.   tamsulosin (FLOMAX) 0.4 MG CAPS capsule Take 1 capsule (0.4 mg total) by mouth daily.   No facility-administered encounter medications on file as of 10/02/2021.    Patient Active Problem List   Diagnosis Date Noted   Aortic stenosis 09/11/2021   Seizure disorder ([place]) 08/29/2021   DVT (deep venous thrombosis) ([place]) 08/25/2021   Acute urinary retention 08/18/2021   Status epilepticus ([place]) 08/13/2021   Acute pulmonary embolism without acute cor pulmonale (HCC) 05/27/2021   Demand ischemia ([place]) 05/27/2021   CAD (coronary artery disease) 05/27/2021   Emphysema lung ([place]) 05/27/2021   Acute left hemiparesis (HCC) 04/30/2020   Paroxysmal supraventricular tachycardia ([place]) 04/30/2020   Right lower lobe lung mass 01/07/2020   CVA (cerebral vascular accident) ([place]) 06/26/2019   Wrist swelling, right 06/03/2017   S/P prostatectomy 10/08/2016   Snores 10/08/2016   Essential (primary) hypertension 09/27/2015   History of tobacco use 09/27/2015   Hypercholesterolemia 09/27/2015   Blood glucose elevated 09/27/2015    Conditions to be addressed/monitored:  Essential (primary) hypertension     Hypercholesterolemia     Acute pulmonary embolism without acute cor pulmonale, unspecified pulmonary embolism type ([place])      Limited social support and Level of care concerns  Care Plan : General Social Work (Adult)  Updates made by Vern Claude, LCSW since 10/03/2021 12:00 AM     Problem: CHL  AMB "PATIENT-SPECIFIC PROBLEM"   Note:   CARE PLAN ENTRY (see longitudinal plan of care for additional care plan information)  Current Barriers:  Patient with HTN in need of assistance with connection to community resources  Knowledge deficits and need for support, education and care coordination related to community  resources support  ADL IADL limitations and Limited access to caregiver Vein and vascular MD follow up last week-plan to monitor blockage to see if surgery is needed-patient reports no pain-does not hurt to walk-compression stockings provided Follow up with Pulmonary doctor- nodule in lungs repeat x ray in December Accessible bathroom being installed-walk-in shower with seat-widening the door  Clinical Goal(s)  Over the next 90 days, patient will work with the Department of Human Services to follow up on referral for CAP services  Interventions provided by LCSW: Assessed patient's care coordination needs related to need for in home support due to ADL and IADL limitations-discussed ongoing care management follow up  Patient's daughter remains primary caregiver and actively assists with daily care and medical follow up Patient's daughter confirmed being in the process of making patient's bathroom accessible CAP-DA request remains pending-phone call made to the Department of Health and Human Services-VM left for status update regarding application Caregiver stress acknowledged Positive reinforcement provided    Patient Self Care Activities & Deficits:  Patient is unable to independently navigate community resource options without care coordination support  Acknowledges deficits and is motivated to resolve concern  Unable to perform ADLs independently Unable to perform IADLs independently Patient's daughter available for support, however lives in [place], [place]  Initial goal documentation        Follow Up Plan: SW will follow up with patient by phone over the next 14 business days       [place], [place] Worker  [facility] [phone]

## 2021-10-03 NOTE — Patient Instructions (Signed)
Visit Information   Goals Addressed             This Visit's Progress    Find Help in My Community       Timeframe:  Long-Range Goal Priority:  Medium Start Date:     09/18/21                        Expected End Date:    03/19/21                   Follow Up Date 10/10/21    - begin a notebook of services in my neighborhood or community - call 211 when I need some help - follow-up on any referrals for help I am given - think ahead to make sure my need does not become an emergency - have a back-up plan    Why is this important?   Knowing how and where to find help for yourself or family in your neighborhood and community is an important skill.  You will want to take some steps to learn how.    Notes:         The patient verbalized understanding of instructions, educational materials, and care plan provided today and declined offer to receive copy of patient instructions, educational materials, and care plan.   Telephone follow up appointment with care management team member scheduled for:10/10/21   Elliot Gurney, [place] Worker  [facility] [phone]

## 2021-10-04 DIAGNOSIS — R338 Other retention of urine: Secondary | ICD-10-CM | POA: Diagnosis not present

## 2021-10-04 DIAGNOSIS — N3000 Acute cystitis without hematuria: Secondary | ICD-10-CM | POA: Diagnosis not present

## 2021-10-05 ENCOUNTER — Telehealth: Payer: Medicare Other

## 2021-10-05 MED ORDER — BETHANECHOL CHLORIDE 10 MG PO TABS: 10.0000 mg | ORAL_TABLET | Freq: Three times a day (TID) | ORAL | 3 refills | Status: DC

## 2021-10-05 NOTE — Progress Notes (Deleted)
Chronic Care Management Pharmacy Note  10/05/2021 Name:  Travis Salazar. MRN:  [id number] DOB:  1935/12/26  Summary: ***  Recommendations/Changes made from today's visit: ***  Plan: ***   Subjective: Travis Bushey. is an 85 y.o. year old male who is a primary patient of [name], MD.  The CCM team was consulted for assistance with disease management and care coordination needs.    {CCMTELEPHONEFACETOFACE:21091510} for {CCMINITIALFOLLOWUPCHOICE:21091511} in response to provider referral for pharmacy case management and/or care coordination services.   Consent to Services:  {CCMCONSENTOPTIONS:25074}  Patient Care Team: [name], MD as PCP - General (Family Medicine) Jerline Pain, MD as PCP - Cardiology (Cardiology) Pa, [place] as Consulting Physician (Optometry) Bernardo Heater, Ronda Fairly, MD as Consulting Physician (Urology) Germaine Pomfret, [facility] (Pharmacist) Cameron Sprang, MD as Consulting Physician (Neurology)  Recent office visits: ***  Recent consult visits: [facility] visits: {Hospital DC Yes/No:25215}   Objective:  Lab Results  Component Value Date   CREATININE 0.90 09/07/2021   BUN 13 09/07/2021   GFRNONAA >60 09/07/2021   GFRAA 86 08/29/2020   NA 134 (L) 09/07/2021   K 4.0 09/07/2021   CALCIUM 9.8 09/07/2021   CO2 23 09/07/2021   GLUCOSE 131 (H) 09/07/2021    Lab Results  Component Value Date/Time   HGBA1C 5.9 (H) 08/29/2020 11:35 AM   HGBA1C 5.8 04/25/2020 10:17 AM   HGBA1C 6.0 (A) 01/24/2020 11:07 AM   HGBA1C 5.5 09/21/2019 11:44 AM    Last diabetic Eye exam: No results found for: HMDIABEYEEXA  Last diabetic Foot exam: No results found for: HMDIABFOOTEX   Lab Results  Component Value Date   CHOL 187 08/29/2020   HDL 40 08/29/2020   LDLCALC 125 (H) 08/29/2020   TRIG 89 08/14/2021   CHOLHDL 4.7 08/29/2020    Hepatic Function Latest Ref Rng & Units 08/26/2021 08/25/2021 08/23/2021  Total Protein  6.5 - 8.1 g/dL 6.2(L) 7.0 5.8(L)  Albumin 3.5 - 5.0 g/dL 2.4(L) 2.9(L) 2.6(L)  AST 15 - 41 U/L 31 40 35  ALT 0 - 44 U/L 30 38 36  Alk Phosphatase 38 - 126 U/L 49 57 49  Total Bilirubin 0.3 - 1.2 mg/dL 0.8 1.1 0.5    Lab Results  Component Value Date/Time   TSH 4.040 08/29/2020 11:35 AM   TSH 2.820 09/21/2019 11:44 AM    CBC Latest Ref Rng & Units 09/07/2021 08/30/2021 08/29/2021  WBC 4.0 - 10.5 K/uL 14.1(H) 8.2 8.2  Hemoglobin 13.0 - 17.0 g/dL 12.7(L) 11.4(L) 11.4(L)  Hematocrit 39.0 - 52.0 % 41.4 35.7(L) 35.3(L)  Platelets 150 - 400 K/uL 267 336 296    No results found for: VD25OH  Clinical ASCVD: {YES/NO:21197} The ASCVD Risk score (Arnett DK, et al., 2019) failed to calculate for the following reasons:   The 2019 ASCVD risk score is only valid for ages 19 to 10   The patient has a prior MI or stroke diagnosis    Depression screen [facility] 2/9 09/18/2021 06/20/2021 04/17/2020  Decreased Interest 0 0 0  Down, Depressed, Hopeless 0 0 0  PHQ - 2 Score 0 0 0  Altered sleeping - - -  Tired, decreased energy - - -  Change in appetite - - -  Feeling bad or failure about yourself  - - -  Trouble concentrating - - -  Moving slowly or fidgety/restless - - -  Suicidal thoughts - - -  PHQ-9 Score - - -  Difficult doing work/chores - - -     ***Other: (CHADS2VASc if Afib, MMRC or CAT for COPD, ACT, DEXA)  Social History   Tobacco Use  Smoking Status Former   Packs/day: 1.00   Years: 50.00   Pack years: 50.00   Types: Cigarettes   Quit date: 12/15/2009   Years since quitting: 11.8  Smokeless Tobacco Never   BP Readings from Last 3 Encounters:  09/28/21 118/72  09/20/21 111/69  09/14/21 106/68   Pulse Readings from Last 3 Encounters:  09/28/21 82  09/20/21 93  09/14/21 71   Wt Readings from Last 3 Encounters:  09/28/21 189 lb (85.7 kg)  09/20/21 192 lb 9.6 oz (87.4 kg)  09/14/21 188 lb 3.2 oz (85.4 kg)   BMI Readings from Last 3 Encounters:  09/28/21 28.74 kg/m   09/20/21 29.28 kg/m  09/14/21 28.62 kg/m    Assessment/Interventions: Review of patient past medical history, allergies, medications, health status, including review of consultants reports, laboratory and other test data, was performed as part of comprehensive evaluation and provision of chronic care management services.   SDOH:  (Social Determinants of Health) assessments and interventions performed: {yes/no:20286}  SDOH Screenings   Alcohol Screen: Low Risk    Last Alcohol Screening Score (AUDIT): 0  Depression (PHQ2-9): Low Risk    PHQ-2 Score: 0  Financial Resource Strain: Low Risk    Difficulty of Paying Living Expenses: Not hard at all  Food Insecurity: No Food Insecurity   Worried About Charity fundraiser in the Last Year: Never true   Ran Out of Food in the Last Year: Never true  Housing: Low Risk    Last Housing Risk Score: 0  Physical Activity: Insufficiently Active   Days of Exercise per Week: 7 days   Minutes of Exercise per Session: 20 min  Social Connections: Moderately Integrated   Frequency of Communication with Friends and Family: More than three times a week   Frequency of Social Gatherings with Friends and Family: More than three times a week   Attends Religious Services: More than 4 times per year   Active Member of [employer] or Organizations: No   Attends Music therapist: Never   Marital Status: Married  Stress: No Stress Concern Present   Feeling of Stress : Not at all  Tobacco Use: Medium Risk   Smoking Tobacco Use: Former   Smokeless Tobacco Use: Never   Passive Exposure: Not on [employer] consultant Needs: No Transportation Needs   Lack of Transportation (Medical): No   Lack of Transportation (Non-Medical): No    CCM Care Plan  No Known Allergies  Medications Reviewed Today     Reviewed by Madalyn Rob, LPN (Licensed Practical Nurse) on 09/28/21 at 38  Med List Status: <None>   Medication Order Taking? Sig Documenting  Provider Last Dose Status Informant  allopurinol (ZYLOPRIM) 300 MG tablet [id number] Yes Take 300 mg by mouth daily as needed. [provider] Taking Active   amLODipine (NORVASC) 10 MG tablet [id number] Yes TAKE 1 TABLET BY MOUTH EVERY DAY [name], MD Taking Active   APIXABAN Arne [place]) VTE STARTER PACK (10MG AND 5MG) [id number] Yes Take as directed on package: start with two-23m tablets twice daily for 7 days. On day 8, switch to one-529mtablet twice daily. GoSamuella CotaMD Taking Active   atorvastatin (LIPITOR) 40 MG tablet [id number] TAKE 1 TABLET (40 MG TOTAL) BY MOUTH DAILY AT 6 PM. GiEulas Post  Brooke Bonito., MD Taking Active Self  bethanechol (URECHOLINE) 10 MG tablet [id number] Yes Take 10 mg by mouth 3 (three) times daily. [provider] Taking Active Child  levETIRAcetam (KEPPRA) 1000 MG tablet [id number] Yes Take 1 tablet (1,000 mg total) by mouth 2 (two) times daily. Cameron Sprang, MD Taking Active   metoprolol tartrate (LOPRESSOR) 25 MG tablet [id number] Yes Take one-half tablet (12.5 mg total) by mouth 2 (two) times daily. [name], MD Taking Active   tamsulosin [facility]) 0.4 MG CAPS capsule [id number] Yes Take 1 capsule (0.4 mg total) by mouth daily. [name], MD Taking Active             Patient Active Problem List   Diagnosis Date Noted   Aortic stenosis 09/11/2021   Seizure disorder ([facility]) 08/29/2021   DVT (deep venous thrombosis) ([place]) 08/25/2021   Acute urinary retention 08/18/2021   Status epilepticus ([place]) 08/13/2021   Acute pulmonary embolism without acute cor pulmonale (HCC) 05/27/2021   Demand ischemia ([place]) 05/27/2021   CAD (coronary artery disease) 05/27/2021   Emphysema lung ([place]) 05/27/2021   Acute left hemiparesis (HCC) 04/30/2020   Paroxysmal supraventricular tachycardia ([place]) 04/30/2020   Right lower lobe lung mass 01/07/2020   CVA (cerebral vascular accident) ([place]) 06/26/2019   Wrist swelling,  right 06/03/2017   S/P prostatectomy 10/08/2016   Snores 10/08/2016   Essential (primary) hypertension 09/27/2015   History of tobacco use 09/27/2015   Hypercholesterolemia 09/27/2015   Blood glucose elevated 09/27/2015    Immunization History  Administered Date(s) Administered   Influenza-Unspecified 10/16/2018   PFIZER(Purple Top)SARS-COV-2 Vaccination 12/18/2020   Pneumococcal Conjugate-13 10/04/2014   Pneumococcal Polysaccharide-23 01/11/2013   Zoster, Live 11/13/2012    Conditions to be addressed/monitored:  {USCCMDZASSESSMENTOPTIONS:23563}  There are no care plans that you recently modified to display for this patient.    Medication Assistance: {MEDASSISTANCEINFO:25044}  Compliance/Adherence/Medication fill history: Care Gaps: ***  Star-Rating Drugs: ***  Patient's preferred pharmacy is:  [facility] [address] [place] [address] Phone: [phone] Fax: [phone]  [id number] [place] [address]Phone: [phone]: [phone] Uses pill box? {Yes or If no, why not?:20788} Pt endorses ***% compliance  We discussed: {Pharmacy options:24294} Patient decided to: {US Pharmacy Plan:23885}  Care Plan and Follow Up Patient Decision:  {FOLLOWUP:24991}  Plan: {CM FOLLOW UP PLAN:25073}  ***

## 2021-10-06 ENCOUNTER — Emergency Department (HOSPITAL_COMMUNITY): Payer: Medicare Other

## 2021-10-06 ENCOUNTER — Encounter (HOSPITAL_COMMUNITY): Payer: Self-pay

## 2021-10-06 ENCOUNTER — Observation Stay (HOSPITAL_COMMUNITY)
Admission: EM | Admit: 2021-10-06 | Discharge: 2021-10-07 | Disposition: A | Discharge status: Home / Self Care | Payer: Medicare Other | Attending: Internal Medicine | Admitting: Internal Medicine

## 2021-10-06 ENCOUNTER — Other Ambulatory Visit: Payer: Self-pay

## 2021-10-06 DIAGNOSIS — S0101XA Laceration without foreign body of scalp, initial encounter: Secondary | ICD-10-CM

## 2021-10-06 DIAGNOSIS — Z86711 Personal history of pulmonary embolism: Secondary | ICD-10-CM | POA: Insufficient documentation

## 2021-10-06 DIAGNOSIS — Z20822 Contact with and (suspected) exposure to covid-19: Secondary | ICD-10-CM | POA: Diagnosis not present

## 2021-10-06 DIAGNOSIS — R2689 Other abnormalities of gait and mobility: Secondary | ICD-10-CM | POA: Insufficient documentation

## 2021-10-06 DIAGNOSIS — R911 Solitary pulmonary nodule: Secondary | ICD-10-CM | POA: Diagnosis not present

## 2021-10-06 DIAGNOSIS — R569 Unspecified convulsions: Secondary | ICD-10-CM

## 2021-10-06 DIAGNOSIS — Z7901 Long term (current) use of anticoagulants: Secondary | ICD-10-CM | POA: Diagnosis not present

## 2021-10-06 DIAGNOSIS — R918 Other nonspecific abnormal finding of lung field: Secondary | ICD-10-CM | POA: Insufficient documentation

## 2021-10-06 DIAGNOSIS — W01198A Fall on same level from slipping, tripping and stumbling with subsequent striking against other object, initial encounter: Secondary | ICD-10-CM | POA: Diagnosis not present

## 2021-10-06 DIAGNOSIS — G9389 Other specified disorders of brain: Secondary | ICD-10-CM | POA: Diagnosis not present

## 2021-10-06 DIAGNOSIS — I6529 Occlusion and stenosis of unspecified carotid artery: Secondary | ICD-10-CM | POA: Diagnosis not present

## 2021-10-06 DIAGNOSIS — I10 Essential (primary) hypertension: Secondary | ICD-10-CM

## 2021-10-06 DIAGNOSIS — Z8673 Personal history of transient ischemic attack (TIA), and cerebral infarction without residual deficits: Secondary | ICD-10-CM | POA: Insufficient documentation

## 2021-10-06 DIAGNOSIS — R58 Hemorrhage, not elsewhere classified: Secondary | ICD-10-CM | POA: Diagnosis not present

## 2021-10-06 DIAGNOSIS — S199XXA Unspecified injury of neck, initial encounter: Secondary | ICD-10-CM | POA: Diagnosis not present

## 2021-10-06 DIAGNOSIS — W19XXXA Unspecified fall, initial encounter: Secondary | ICD-10-CM | POA: Diagnosis not present

## 2021-10-06 DIAGNOSIS — Y9289 Other specified places as the place of occurrence of the external cause: Secondary | ICD-10-CM | POA: Insufficient documentation

## 2021-10-06 DIAGNOSIS — S01112A Laceration without foreign body of left eyelid and periocular area, initial encounter: Secondary | ICD-10-CM

## 2021-10-06 DIAGNOSIS — Z743 Need for continuous supervision: Secondary | ICD-10-CM | POA: Diagnosis not present

## 2021-10-06 DIAGNOSIS — S0990XA Unspecified injury of head, initial encounter: Secondary | ICD-10-CM | POA: Diagnosis not present

## 2021-10-06 DIAGNOSIS — R930 Abnormal findings on diagnostic imaging of skull and head, not elsewhere classified: Secondary | ICD-10-CM

## 2021-10-06 DIAGNOSIS — R000 Tachycardia, unspecified: Secondary | ICD-10-CM | POA: Diagnosis not present

## 2021-10-06 HISTORY — DX: Unspecified convulsions: R56.9

## 2021-10-06 HISTORY — DX: Essential (primary) hypertension: I10

## 2021-10-06 HISTORY — DX: Laceration without foreign body of left eyelid and periocular area, initial encounter: S01.112A

## 2021-10-06 LAB — CBC WITH DIFFERENTIAL/PLATELET
Abs Immature Granulocytes: 0.02 10*3/uL (ref 0.00–0.07)
Basophils Absolute: 0 10*3/uL (ref 0.0–0.1)
Basophils Relative: 1 %
Eosinophils Absolute: 0.1 10*3/uL (ref 0.0–0.5)
Eosinophils Relative: 2 %
HCT: 35.4 % — ABNORMAL LOW (ref 39.0–52.0)
Hemoglobin: 11.2 g/dL — ABNORMAL LOW (ref 13.0–17.0)
Immature Granulocytes: 0 %
Lymphocytes Relative: 29 %
Lymphs Abs: 1.7 10*3/uL (ref 0.7–4.0)
MCH: 28.6 pg (ref 26.0–34.0)
MCHC: 31.6 g/dL (ref 30.0–36.0)
MCV: 90.3 fL (ref 80.0–100.0)
Monocytes Absolute: 0.5 10*3/uL (ref 0.1–1.0)
Monocytes Relative: 9 %
Neutro Abs: 3.5 10*3/uL (ref 1.7–7.7)
Neutrophils Relative %: 59 %
Platelets: 212 10*3/uL (ref 150–400)
RBC: 3.92 MIL/uL — ABNORMAL LOW (ref 4.22–5.81)
RDW: 14.1 % (ref 11.5–15.5)
WBC: 5.9 10*3/uL (ref 4.0–10.5)
nRBC: 0 % (ref 0.0–0.2)

## 2021-10-06 LAB — COMPREHENSIVE METABOLIC PANEL
ALT: 11 U/L (ref 0–44)
AST: 17 U/L (ref 15–41)
Albumin: 3.1 g/dL — ABNORMAL LOW (ref 3.5–5.0)
Alkaline Phosphatase: 54 U/L (ref 38–126)
Anion gap: 8 (ref 5–15)
BUN: 14 mg/dL (ref 8–23)
CO2: 25 mmol/L (ref 22–32)
Calcium: 9.6 mg/dL (ref 8.9–10.3)
Chloride: 102 mmol/L (ref 98–111)
Creatinine, Ser: 0.92 mg/dL (ref 0.61–1.24)
GFR, Estimated: >60 mL/min (ref >60)
Glucose, Bld: 115 mg/dL — ABNORMAL HIGH (ref 70–99)
Potassium: 4.4 mmol/L (ref 3.5–5.1)
Sodium: 135 mmol/L (ref 135–145)
Total Bilirubin: 0.4 mg/dL (ref 0.3–1.2)
Total Protein: 6.7 g/dL (ref 6.5–8.1)

## 2021-10-06 LAB — RESP PANEL BY RT-PCR (FLU A&B, COVID) ARPGX2
Influenza A by PCR: NEGATIVE
Influenza B by PCR: NEGATIVE
SARS Coronavirus 2 by RT PCR: NEGATIVE

## 2021-10-06 LAB — CBG MONITORING, ED: Glucose-Capillary: 110 mg/dL — ABNORMAL HIGH (ref 70–99)

## 2021-10-06 LAB — TROPONIN I (HIGH SENSITIVITY)
Troponin I (High Sensitivity): 4 ng/L (ref <18)
Troponin I (High Sensitivity): 3 ng/L (ref <18)

## 2021-10-06 MED ORDER — LEVETIRACETAM IN NACL 1000 MG/100ML IV SOLN
1000.0000 mg | Freq: Once | INTRAVENOUS | Status: AC
  Administered 2021-10-06: 1000 mg via INTRAVENOUS
  Filled 2021-10-06: qty 100

## 2021-10-06 MED ORDER — LORAZEPAM 2 MG/ML IJ SOLN
1.0000 mg | Freq: Once | INTRAMUSCULAR | Status: AC
  Administered 2021-10-06: 1 mg via INTRAVENOUS
  Filled 2021-10-06: qty 1

## 2021-10-06 MED ORDER — HYDROMORPHONE HCL 1 MG/ML IJ SOLN: 1.0000 mg | Freq: Once | INTRAMUSCULAR | Status: DC

## 2021-10-06 MED ORDER — SODIUM CHLORIDE 0.9 % IV BOLUS
1000.0000 mL | Freq: Once | INTRAVENOUS | Status: AC
  Administered 2021-10-06: 1000 mL via INTRAVENOUS

## 2021-10-06 NOTE — ED Provider Notes (Signed)
[facility] EMERGENCY DEPARTMENT Provider Note   CSN: [id number] Arrival date & time: 10/06/21  1802     History No chief complaint on file.   Travis Salazar is a 85 y.o. male history of hypertension, PE on Eliquis, seizure on Keppra, here presenting with fall.  Patient states that he slipped from his couch and hit his head patient was noted to have tremors on the left side.  Patient has history of seizures.  He states that he is taking his Keppra but cannot tell me his dose.  He told me that he is on Eliquis for previous PEs.   The history is provided by the patient.      Past Medical History:  Diagnosis Date   Hypertension    Seizures ([place])     There are no problems to display for this patient.     No family history on file.     Home Medications Prior to Admission medications   Not on File    Allergies    Patient has no allergy information on record.  Review of Systems   Review of Systems  Neurological:  Positive for seizures.  All other systems reviewed and are negative.  Physical Exam Updated Vital Signs BP 107/67   Pulse 83   Temp 97.8 F (36.6 C) (Oral)   Resp 14   Ht 5\' 8"  (1.727 m)   Wt 84.4 kg   SpO2 100%   BMI 28.28 kg/m   Physical Exam Vitals and nursing note reviewed.  HENT:     Head:     Comments: 2 cm laceration in the left temporal area.    Nose: Nose normal.     Mouth/Throat:     Mouth: Mucous membranes are moist.  Eyes:     Extraocular Movements: Extraocular movements intact.     Pupils: Pupils are equal, round, and reactive to light.  Cardiovascular:     Rate and Rhythm: Normal rate and regular rhythm.     Pulses: Normal pulses.     Heart sounds: Normal heart sounds.  Pulmonary:     Effort: Pulmonary effort is normal.     Breath sounds: Normal breath sounds.  Abdominal:     General: Abdomen is flat.     Palpations: Abdomen is soft.  Musculoskeletal:        General: Normal range of motion.     Cervical  back: Normal range of motion and neck supple.  Skin:    General: Skin is warm.     Capillary Refill: Capillary refill takes less than 2 seconds.  Neurological:     Mental Status: He is alert.     Comments: Patient has tremors on the left side.  Patient has normal strength on the right side.  There is no eye deviation.  Patient is alert and oriented   Psychiatric:        Mood and Affect: Mood normal.        Behavior: Behavior normal.    ED Results / Procedures / Treatments   Labs (all labs ordered are listed, but only abnormal results are displayed) Labs Reviewed  CBC WITH DIFFERENTIAL/PLATELET - Abnormal; Notable for the following components:      Result Value   RBC 3.92 (*)    Hemoglobin 11.2 (*)    HCT 35.4 (*)    All other components within normal limits  CBG MONITORING, ED - Abnormal; Notable for the following components:   Glucose-Capillary 110 (*)  All other components within normal limits  COMPREHENSIVE METABOLIC PANEL  TROPONIN I (HIGH SENSITIVITY)    EKG None  Radiology DG Chest Port 1 View  Result Date: 10/06/2021 CLINICAL DATA:  Fall today. EXAM: PORTABLE CHEST 1 VIEW COMPARISON:  Frontal and lateral views 08/25/2021. Chest CT 05/26/2021 FINDINGS: Stable heart size. Unchanged mediastinal contours with diffuse aortic atherosclerosis and tortuosity. Mild left basilar atelectasis or scarring. No acute airspace disease. No pleural effusion or pneumothorax. Nodular densities in the right upper lobe, also seen on prior imaging. Grossly stable in radiographic appearance. No acute osseous abnormalities are seen. IMPRESSION: 1. No acute abnormality. 2. Pulmonary nodules/mass in the right upper lobe, also seen on prior imaging, grossly stable in radiographic appearance. Aortic Atherosclerosis (ICD10-I70.0). Electronically Signed   By: Keith Rake M.D.   On: 10/06/2021 18:29    Procedures Procedures   LACERATION REPAIR Performed by: Wandra Arthurs Authorized by: Wandra Arthurs Consent: Verbal consent obtained. Risks and benefits: risks, benefits and alternatives were discussed Consent given by: patient Patient identity confirmed: provided demographic data Prepped and Draped in normal sterile fashion Wound explored  Laceration Location: L forehead   Laceration Length: 2 cm  No Foreign Bodies seen or palpated  Anesthesia: local infiltration  Local anesthetic: none   Irrigation method: syringe Amount of cleaning: standard  Skin closure: dermabond   Number of sutures:   Technique:dermabond   Patient tolerance: Patient tolerated the procedure well with no immediate complications.   Medications Ordered in ED Medications  sodium chloride 0.9 % bolus 1,000 mL (1,000 mLs Intravenous New Bag/Given 10/06/21 1856)  levETIRAcetam (KEPPRA) IVPB 1000 mg/100 mL premix (0 mg Intravenous Stopped 10/06/21 1833)  LORazepam (ATIVAN) injection 1 mg (1 mg Intravenous Given 10/06/21 1815)    ED Course  I have reviewed the triage vital signs and the nursing notes.  Pertinent labs & imaging results that were available during my care of the patient were reviewed by me and considered in my medical decision making (see chart for details).    MDM Rules/Calculators/A&P                           Travis Salazar is a 85 y.o. male here presenting with fall and possible seizure activity.  Patient had seizure-like activity in the left arm and leg on arrival.  We will load with Keppra and give Ativan. Will get CT head as well.  Patient is on Eliquis for previous DVT  8:09 PM CT showed hypodensity in the left cerebellum.  Not quite clear if this is secondary to his seizure or he had another stroke.  Consulted Dr. Theda Sers from neurology. Patient is no longer having tremors.  At this point, he states that he will see the patient and recommend TIA versus stroke work-up.  Patient may need another EEG if he has another seizure again.    Final Clinical Impression(s) / ED  Diagnoses Final diagnoses:  None    Rx / DC Orders ED Discharge Orders     None        Drenda Freeze, MD 10/06/21 2010

## 2021-10-06 NOTE — H&P (Signed)
History and Physical    Travis Salazar [id number] DOB: 28-Nov-1936 DOA: 10/06/2021  PCP: Pcp, No   Patient coming from: Home  Chief Complaint: Seizure after falling off couch  HPI: Travis Salazar is a 85 y.o. male with medical history significant for seizure, recent PE which he is on eliquis for,  CVA, HTN who presents by EMS after falling off his couch and having a seizure. He sustained a laceration of left eyebrow which has been repaired by the ER physician.  He does not remember any inciting incidents prior to falling off the couch.  He does not member the events of falling off the couch.  Reportedly he fell and hit his left head on the ground sustaining a laceration left eyebrow and had tremors and seizure activity.  When he presented by EMS he was actively seizing and was given Ativan and Keppra in the emergency room.  Patient is now awake and alert and can answer questions.  He denies any headache, abdominal pain, chest pain, shortness of breath.  He has not had any recent fever or illness.  He has been taking his medications as prescribed but does not member the names of the medications at this time.  ED Course: [name] has been hemodynamically stable in the emergency room.  CT of the head and neck were obtained.  There is no intracranial hemorrhage on CT scan although he does have a new hypodensity in the left cerebellum.  CT of the neck was negative for fracture or acute injury.  ER physician discussed with neurology was been consulted and will see patient.  MRI of the brain has been ordered and is pending.  The recommendations from neurology are pending.  CMP is unremarkable.  Troponin levels were 4 and 3 in the emergency room.  CBC is unremarkable.  Hospitalist service was asked admit for further management  Review of Systems:  General: Denies fever, chills, weight loss, night sweats.  Denies dizziness.  Denies change in appetite HENT: Denies head trauma,  denies change in hearing,  tinnitus. Denies nasal bleeding.  Denies sore throat.  Denies difficulty swallowing Eyes: Denies blurry vision, pain in eye, drainage.  Denies discoloration of eyes. Neck: Denies pain.  Denies swelling.  Denies pain with movement. Cardiovascular: Denies chest pain, palpitations.  Denies edema.  Denies orthopnea Respiratory: Denies shortness of breath, cough.  Denies wheezing.  Denies sputum production Gastrointestinal: Denies abdominal pain, swelling.  Denies nausea, vomiting, diarrhea.  Denies melena.  Denies hematemesis. Musculoskeletal: Denies limitation of movement.  Denies deformity or swelling.  Denies pain.  Denies arthralgias or myalgias. Genitourinary: Denies pelvic pain.  Denies urinary frequency or hesitancy.  Denies dysuria.  Skin: Denies rash.  Denies petechiae, purpura, ecchymosis. Neurological: Denies syncope. Denies paresthesia.  Denies slurred speech, drooping face.  Denies visual change. Psychiatric: Denies depression, anxiety. Denies hallucinations.  Past Medical History:  Diagnosis Date   Hypertension    Seizures ([place])     History reviewed. No pertinent surgical history.  Social History  has no history on file for tobacco use, alcohol use, and drug use.  Not on File  History reviewed. No pertinent family history.   Prior to Admission medications   Not on File    Physical Exam: Vitals:   10/06/21 1930 10/06/21 1945 10/06/21 2000 10/06/21 2015  BP: 102/74 122/86 130/74 126/84  Pulse: 80 80 84 76  Resp: 14 15 (!) 22 (!) 22  Temp:      TempSrc:  SpO2: 96% 99% 97% 99%  Weight:      Height:        Constitutional: NAD, calm, comfortable Vitals:   10/06/21 1930 10/06/21 1945 10/06/21 2000 10/06/21 2015  BP: 102/74 122/86 130/74 126/84  Pulse: 80 80 84 76  Resp: 14 15 (!) 22 (!) 22  Temp:      TempSrc:      SpO2: 96% 99% 97% 99%  Weight:      Height:       General: WDWN, Alert and oriented x3.  Eyes: EOMI, PERRL, conjunctivae normal.  Sclera  nonicteric HENT:  [place]/AT, external ears normal.  Nares patent without epistasis.  Mucous membranes are moist. Posterior pharynx clear Neck: Soft, normal range of motion, supple, no masses, Trachea midline Respiratory: clear to auscultation bilaterally, no wheezing, no crackles. Normal respiratory effort. No accessory muscle use.  Cardiovascular: Regular rate and rhythm, no murmurs / rubs / gallops. Has lower extremity edema worse on the left. 1+ pedal pulses.  Abdomen: Soft, no tenderness, nondistended, no rebound or guarding.  No masses palpated. Bowel sounds normoactive Musculoskeletal: FROM. no cyanosis. No joint deformity upper and lower extremities.  Normal muscle tone.  Skin: Warm, dry, intact no rashes, lesions, ulcers. No induration Neurologic: CN 2-12 grossly intact.  Normal speech. Sensation intact, patella DTR +1 bilaterally. Strength 5/5 in all extremities.  No tremor Psychiatric: Normal judgment and insight.  Normal mood.   Labs on Admission: I have personally reviewed following labs and imaging studies  CBC: Recent Labs  Lab 10/06/21 1818  WBC 5.9  NEUTROABS 3.5  HGB 11.2*  HCT 35.4*  MCV 90.3  PLT 166    Basic Metabolic Panel: Recent Labs  Lab 10/06/21 1818  NA 135  K 4.4  CL 102  CO2 25  GLUCOSE 115*  BUN 14  CREATININE 0.92  CALCIUM 9.6    GFR: Estimated Creatinine Clearance: 65.5 mL/min (by C-G formula based on SCr of 0.92 mg/dL).  Liver Function Tests: Recent Labs  Lab 10/06/21 1818  AST 17  ALT 11  ALKPHOS 54  BILITOT 0.4  PROT 6.7  ALBUMIN 3.1*    Urine analysis: No results found for: COLORURINE, APPEARANCEUR, LABSPEC, PHURINE, GLUCOSEU, HGBUR, BILIRUBINUR, KETONESUR, PROTEINUR, UROBILINOGEN, NITRITE, LEUKOCYTESUR  Radiological Exams on Admission: CT HEAD WO CONTRAST (5MM)  Result Date: 10/06/2021 CLINICAL DATA:  Cerebral hemorrhage suspected; Neck trauma (Age >= 65y) EXAM: CT HEAD WITHOUT CONTRAST CT CERVICAL SPINE WITHOUT CONTRAST  TECHNIQUE: Multidetector CT imaging of the head and cervical spine was performed following the standard protocol without intravenous contrast. Multiplanar CT image reconstructions of the cervical spine were also generated. COMPARISON:  May 27, 2021. FINDINGS: Evaluation is limited secondary to motion. CT HEAD FINDINGS Brain: No evidence of acute hemorrhage, hydrocephalus, extra-axial collection or mass lesion/mass effect. Remote RIGHT MCA infarction with corresponding encephalomalacia. There is possible hypodensity involving the LEFT cerebellum (series 2, image 11; series 4, image 48,). Global parenchymal volume loss. Periventricular white matter hypodensities consistent with sequela of chronic microvascular ischemic disease. Vascular: Vascular calcifications. Skull: No acute fracture within the limitations of the exam. Sinuses/Orbits: No acute finding. Mucosal thickening of the RIGHT maxillary sinus. Other: None. CT CERVICAL SPINE FINDINGS Alignment: No spondylolisthesis or acute static subluxation visualized. Subluxation of RIGHT C1-2 articulation is favored to be due to positioning. Skull base and vertebrae: No acute fracture within the limitations of motion. Soft tissues and spinal canal: No prevertebral fluid or swelling. No visible canal hematoma. Disc levels: Multilevel  intervertebral disc space height loss most pronounced at C3-4, C5-6, C6-7 and C7-T1. Multilevel osseous neuroforaminal narrowing due to facet uncovertebral hypertrophy. This is most pronounced at LEFT greater than RIGHT C3-4, C4-5 and C5-6 Upper chest: Negative. Other: Atherosclerotic calcifications of the carotids. IMPRESSION: 1. Possible hypodensity involving the LEFT cerebellum. Evaluation is limited secondary to motion artifact and streak artifact at this level. If concern for acute infarction, recommend further evaluation with dedicated MRI. 2. No acute fracture of the cervical spine within the limitations of the exam. Evaluation is  limited by motion. Electronically Signed   By: Valentino Saxon M.D.   On: 10/06/2021 19:24   CT Cervical Spine Wo Contrast  Result Date: 10/06/2021 CLINICAL DATA:  Cerebral hemorrhage suspected; Neck trauma (Age >= 65y) EXAM: CT HEAD WITHOUT CONTRAST CT CERVICAL SPINE WITHOUT CONTRAST TECHNIQUE: Multidetector CT imaging of the head and cervical spine was performed following the standard protocol without intravenous contrast. Multiplanar CT image reconstructions of the cervical spine were also generated. COMPARISON:  May 27, 2021. FINDINGS: Evaluation is limited secondary to motion. CT HEAD FINDINGS Brain: No evidence of acute hemorrhage, hydrocephalus, extra-axial collection or mass lesion/mass effect. Remote RIGHT MCA infarction with corresponding encephalomalacia. There is possible hypodensity involving the LEFT cerebellum (series 2, image 11; series 4, image 48,). Global parenchymal volume loss. Periventricular white matter hypodensities consistent with sequela of chronic microvascular ischemic disease. Vascular: Vascular calcifications. Skull: No acute fracture within the limitations of the exam. Sinuses/Orbits: No acute finding. Mucosal thickening of the RIGHT maxillary sinus. Other: None. CT CERVICAL SPINE FINDINGS Alignment: No spondylolisthesis or acute static subluxation visualized. Subluxation of RIGHT C1-2 articulation is favored to be due to positioning. Skull base and vertebrae: No acute fracture within the limitations of motion. Soft tissues and spinal canal: No prevertebral fluid or swelling. No visible canal hematoma. Disc levels: Multilevel intervertebral disc space height loss most pronounced at C3-4, C5-6, C6-7 and C7-T1. Multilevel osseous neuroforaminal narrowing due to facet uncovertebral hypertrophy. This is most pronounced at LEFT greater than RIGHT C3-4, C4-5 and C5-6 Upper chest: Negative. Other: Atherosclerotic calcifications of the carotids. IMPRESSION: 1. Possible hypodensity  involving the LEFT cerebellum. Evaluation is limited secondary to motion artifact and streak artifact at this level. If concern for acute infarction, recommend further evaluation with dedicated MRI. 2. No acute fracture of the cervical spine within the limitations of the exam. Evaluation is limited by motion. Electronically Signed   By: Valentino Saxon M.D.   On: 10/06/2021 19:24   DG Chest Port 1 View  Result Date: 10/06/2021 CLINICAL DATA:  Fall today. EXAM: PORTABLE CHEST 1 VIEW COMPARISON:  Frontal and lateral views 08/25/2021. Chest CT 05/26/2021 FINDINGS: Stable heart size. Unchanged mediastinal contours with diffuse aortic atherosclerosis and tortuosity. Mild left basilar atelectasis or scarring. No acute airspace disease. No pleural effusion or pneumothorax. Nodular densities in the right upper lobe, also seen on prior imaging. Grossly stable in radiographic appearance. No acute osseous abnormalities are seen. IMPRESSION: 1. No acute abnormality. 2. Pulmonary nodules/mass in the right upper lobe, also seen on prior imaging, grossly stable in radiographic appearance. Aortic Atherosclerosis (ICD10-I70.0). Electronically Signed   By: Keith Rake M.D.   On: 10/06/2021 18:29    EKG: Independently reviewed.  EKG shows normal sinus rhythm with nonspecific lateral ST changes but no acute ST elevation or depression.  QTc 441  Assessment/Plan Principal Problem:   Seizure  Mr. Lincoln presented by EMS with active seizure activity. Pt was given ativan and keppra  in ER. He is now and alert and oriented and can answer questions. He is admitted to Progressive care unit for close monitoring. Continue Keppra. Ativan IV for further seiuzre activity ordered.  Neurology has been consulted by the ER physician, Dr. Theda Sers to evaluate. MRI of the brain ordered and pending. Await neurology recommendations on EEG Placed on fall and seizure precautions  Active Problems:   Essential hypertension Stable and  will have pharmacy obtain and verify home medications and resume    Abnormal CT of the head Has a hypodensity in the left cerebellum which will be further evaluated with MRI of the brain    Laceration of left eyebrow 2 cm laceration repaired by Er physician.     History of PE and stroke On Eliquis which is continued.     DVT prophylaxis: Is on Eliquis for full anticoagulation  Code Status:   Full Code  Family Communication:  Diagnosis and plan discussed with patient.  He verbalized understanding agrees with plan.  Further recommendations to follow as clinical indicated.  Questions answered. Disposition Plan:   Patient is from:  Home  Anticipated DC to:  Home  Anticipated DC date:  Anticipate 2 midnight or more stay in the hospital  Anticipated DC barriers: No barriers to discharge identified at this time  Consults called:  Neurololgy consulted by ER physician and Dr. Theda Sers to see  Admission status:  Inpatient  Yevonne Aline Holli Rengel MD [facility]  How to contact the [facility] Attending or Consulting provider [place] or covering provider during after hours [place], for this patient?   Check the care team in [facility] and look for a) attending/consulting [facility] provider listed and b) the [facility] team listed Log into [email or web address] and use [place]'s universal password to access. If you do not have the password, please contact the hospital operator. Locate the [facility] provider you are looking for under [facility] and page to a number that you can be directly reached. If you still have difficulty reaching the provider, please page the [facility] (Director on Call) for the Hospitalists listed on amion for assistance.  10/06/2021, 9:11 PM

## 2021-10-06 NOTE — Progress Notes (Signed)
   10/06/21 1817  Clinical Encounter Type  Visited With Health care provider  Visit Type Initial;ED;Trauma  Referral From Nurse   Chaplain responded to a trauma in the ED - level II, fall on blood thinners. Per EMS, the patient's wife is unable to make it to the hospital. Spiritual care services available as needed.   Jeri Lager, Chaplain

## 2021-10-06 NOTE — Progress Notes (Signed)
Orthopedic Tech Progress Note Patient Details:  Travis Salazar Dec 14, 1936 [id number]  Patient ID: Travis Salazar, male   DOB: 1936-03-26, 85 y.o.   MRN: [id number] Level II Trauma; Not Needed. Vernona Rieger 10/06/2021, 6:17 PM

## 2021-10-06 NOTE — ED Notes (Signed)
Trauma Response Nurse Note-  Reason for Call / Reason for Trauma activation:   - L2 fall on thinners, possible seizure like activity  Initial Focused Assessment (If applicable, or please see trauma documentation):  - Pt A&Ox4, sz like activity on left side. Takes Keppra at home but unknown reason. Remembers being on couch watching TV but not falling to floor. Unknown LOC, laceration to left side of head.   Interventions:  - IV, blood work -Ativan and Keppra -CT Head and neck -EEG  Plan of Care as of this note:  - Pending EEG

## 2021-10-06 NOTE — ED Triage Notes (Signed)
Pt bib PTAR from home with wife with complaints of a fall from the couch. Pt says that he slipped from the couch onto his left side and doesn't remember how he got on the floor. On EMS arrival pt was laying on left side with blood around his head. Pt arrives with lac on Left forehead and with constant tremors  .Marland Kitchen AOx4.  EMS vitals:110/80, 99%RA, 108 CBG,102HR

## 2021-10-07 ENCOUNTER — Inpatient Hospital Stay (HOSPITAL_COMMUNITY): Payer: Medicare Other

## 2021-10-07 DIAGNOSIS — Z86711 Personal history of pulmonary embolism: Secondary | ICD-10-CM | POA: Diagnosis not present

## 2021-10-07 DIAGNOSIS — R930 Abnormal findings on diagnostic imaging of skull and head, not elsewhere classified: Secondary | ICD-10-CM

## 2021-10-07 DIAGNOSIS — I10 Essential (primary) hypertension: Secondary | ICD-10-CM

## 2021-10-07 DIAGNOSIS — R569 Unspecified convulsions: Secondary | ICD-10-CM

## 2021-10-07 DIAGNOSIS — Z7901 Long term (current) use of anticoagulants: Secondary | ICD-10-CM

## 2021-10-07 DIAGNOSIS — S01112S Laceration without foreign body of left eyelid and periocular area, sequela: Secondary | ICD-10-CM | POA: Diagnosis not present

## 2021-10-07 DIAGNOSIS — I6930 Unspecified sequelae of cerebral infarction: Secondary | ICD-10-CM | POA: Diagnosis not present

## 2021-10-07 DIAGNOSIS — Z043 Encounter for examination and observation following other accident: Secondary | ICD-10-CM | POA: Diagnosis not present

## 2021-10-07 DIAGNOSIS — Z8673 Personal history of transient ischemic attack (TIA), and cerebral infarction without residual deficits: Secondary | ICD-10-CM

## 2021-10-07 LAB — BASIC METABOLIC PANEL
Anion gap: 10 (ref 5–15)
BUN: 10 mg/dL (ref 8–23)
CO2: 22 mmol/L (ref 22–32)
Calcium: 9.2 mg/dL (ref 8.9–10.3)
Chloride: 102 mmol/L (ref 98–111)
Creatinine, Ser: 0.8 mg/dL (ref 0.61–1.24)
GFR, Estimated: >60 mL/min (ref >60)
Glucose, Bld: 90 mg/dL (ref 70–99)
Potassium: 4 mmol/L (ref 3.5–5.1)
Sodium: 134 mmol/L — ABNORMAL LOW (ref 135–145)

## 2021-10-07 LAB — CBC
HCT: 33.2 % — ABNORMAL LOW (ref 39.0–52.0)
Hemoglobin: 10.4 g/dL — ABNORMAL LOW (ref 13.0–17.0)
MCH: 28.1 pg (ref 26.0–34.0)
MCHC: 31.3 g/dL (ref 30.0–36.0)
MCV: 89.7 fL (ref 80.0–100.0)
Platelets: 210 10*3/uL (ref 150–400)
RBC: 3.7 MIL/uL — ABNORMAL LOW (ref 4.22–5.81)
RDW: 14.3 % (ref 11.5–15.5)
WBC: 6.3 10*3/uL (ref 4.0–10.5)
nRBC: 0 % (ref 0.0–0.2)

## 2021-10-07 MED ORDER — ACETAMINOPHEN 650 MG RE SUPP: 650.0000 mg | Freq: Four times a day (QID) | RECTAL | Status: DC | PRN

## 2021-10-07 MED ORDER — LORAZEPAM 2 MG/ML IJ SOLN
1.0000 mg | INTRAMUSCULAR | Status: DC | PRN
Start: 2021-10-07 — End: 2021-10-07

## 2021-10-07 MED ORDER — AMLODIPINE BESYLATE 10 MG PO TABS
10.0000 mg | ORAL_TABLET | Freq: Every day | ORAL | Status: DC
  Administered 2021-10-07: 10 mg via ORAL
  Filled 2021-10-07: qty 1

## 2021-10-07 MED ORDER — APIXABAN 5 MG PO TABS
5.0000 mg | ORAL_TABLET | Freq: Two times a day (BID) | ORAL | Status: DC
  Administered 2021-10-07 (×2): 5 mg via ORAL
  Filled 2021-10-07 (×3): qty 1

## 2021-10-07 MED ORDER — LEVETIRACETAM 500 MG PO TABS: 1500.0000 mg | ORAL_TABLET | Freq: Two times a day (BID) | ORAL | 0 refills | Status: DC

## 2021-10-07 MED ORDER — METOPROLOL TARTRATE 12.5 MG HALF TABLET: 12.5000 mg | ORAL_TABLET | Freq: Two times a day (BID) | ORAL | Status: DC

## 2021-10-07 MED ORDER — ONDANSETRON HCL 4 MG/2ML IJ SOLN: 4.0000 mg | Freq: Four times a day (QID) | INTRAMUSCULAR | Status: DC | PRN

## 2021-10-07 MED ORDER — ONDANSETRON HCL 4 MG PO TABS: 4.0000 mg | ORAL_TABLET | Freq: Four times a day (QID) | ORAL | Status: DC | PRN

## 2021-10-07 MED ORDER — LEVETIRACETAM IN NACL 1000 MG/100ML IV SOLN: 1000.0000 mg | Freq: Two times a day (BID) | INTRAVENOUS | Status: DC

## 2021-10-07 MED ORDER — LACTATED RINGERS IV SOLN: INTRAVENOUS | Status: DC

## 2021-10-07 MED ORDER — LEVETIRACETAM IN NACL 1000 MG/100ML IV SOLN
1000.0000 mg | Freq: Two times a day (BID) | INTRAVENOUS | Status: DC
  Administered 2021-10-07: 1000 mg via INTRAVENOUS
  Filled 2021-10-07 (×2): qty 100

## 2021-10-07 MED ORDER — ATORVASTATIN CALCIUM 40 MG PO TABS
40.0000 mg | ORAL_TABLET | Freq: Every evening | ORAL | Status: DC
  Filled 2021-10-07: qty 1

## 2021-10-07 MED ORDER — OXYCODONE HCL 5 MG PO TABS: 5.0000 mg | ORAL_TABLET | Freq: Four times a day (QID) | ORAL | Status: DC | PRN

## 2021-10-07 MED ORDER — ACETAMINOPHEN 325 MG PO TABS: 650.0000 mg | ORAL_TABLET | Freq: Four times a day (QID) | ORAL | Status: DC | PRN

## 2021-10-07 MED ORDER — APIXABAN 5 MG PO TABS
5.0000 mg | ORAL_TABLET | Freq: Two times a day (BID) | ORAL | Status: DC
  Administered 2021-10-07: 5 mg via ORAL

## 2021-10-07 MED ORDER — TAMSULOSIN HCL 0.4 MG PO CAPS
0.4000 mg | ORAL_CAPSULE | Freq: Every day | ORAL | Status: DC
  Administered 2021-10-07: 0.4 mg via ORAL
  Filled 2021-10-07: qty 1

## 2021-10-07 MED ORDER — LEVETIRACETAM 500 MG PO TABS
1000.0000 mg | ORAL_TABLET | Freq: Two times a day (BID) | ORAL | Status: DC
  Administered 2021-10-07: 1000 mg via ORAL
  Filled 2021-10-07: qty 2

## 2021-10-07 NOTE — Progress Notes (Signed)
Inpatient Rehab Admissions Coordinator Note:   Per PT patient was screened for CIR candidacy by Tyquez Hollibaugh Danford Bad, CCC-SLP. Note pt is under observation status at this time. Also note pt is pursuing [facility] therapy.  Will not place a consult order.     Gayland Curry, [place], [place] Admissions Coordinator [phone] 10/07/21 5:24 PM

## 2021-10-07 NOTE — Evaluation (Signed)
Physical Therapy Evaluation Patient Details Name: Travis Salazar MRN: [id number] DOB: 1936-12-01 Today's Date: 10/07/2021  History of Present Illness  85 y.o. male presents to [facility] hospital on 10/06/2021 after falling off his couch and experiencing a seizure. CT and MRI negative. PMH includes seizure, recent PE which he is on eliquis for,  CVA, HTN.  Clinical Impression  Pt presents to PT with deficits in vision, coordination, sensation, functional mobility, gait, balance, safety awareness. Pt reports chronic peripheral vision deficits, however appears to have new L visual hemianopia on PT eval. Vision assessment is limited as pt is without his glasses, however the patient consistently must track to left side to identify objects to left of midline. Pt also demonstrates fine and gross motor LUE coordination deficits, unable to grasp RW for ambulation. When ambulating pt benefits from PT assistance for safety due to vision deficits. Safety concerns arise with the possibility of the patient bumping into objects on left side, increasing his risk for falls. PT recommends CIR placement currently as the pt demonstrates limited awareness of vision deficits and remains at a high risk for falls. If the patient is able to have 24/7 assistance from family and physical assistance for all out of bed mobility the patient could safely return home with home health PT services if the patient and family prefer to return home.       Recommendations for follow up therapy are one component of a multi-disciplinary discharge planning process, led by the attending physician.  Recommendations may be updated based on patient status, additional functional criteria and insurance authorization.  Follow Up Recommendations CIR    Equipment Recommendations  None recommended by PT    Recommendations for Other Services Rehab consult     Precautions / Restrictions Precautions Precautions: Fall Precaution Comments: L hemianopia  (difficult to fully assess as pt is without glassess and has chronic vision deficits due to glaucoma) Restrictions Weight Bearing Restrictions: No      Mobility  Bed Mobility Overal bed mobility: Needs Assistance Bed Mobility: Supine to Sit     Supine to sit: Supervision     General bed mobility comments: pt requires multiple verbal cues, initially attempting to sit on left side of bed despite PT cues to sit on right side    Transfers Overall transfer level: Needs assistance Equipment used: None Transfers: Sit to/from Stand Sit to Stand: Min guard         General transfer comment: assistance for safety  Ambulation/Gait Ambulation/Gait assistance: Min assist Gait Distance (Feet): 100 Feet Assistive device: Rolling walker (2 wheeled);IV Pole;None Gait Pattern/deviations: Step-through pattern Gait velocity: functional Gait velocity interpretation: 1.31 - 2.62 ft/sec, indicative of limited community ambulator General Gait Details: pt attempting to utilize walker initially but unable to coordinate LUE to grasp RW handle. Without assistive device the pt benefits from assistance for safety, brisk gait speed. Pt then ambulates with RUE support of IV pole, kicking IV pole once.  Stairs            Wheelchair Mobility    Modified Rankin (Stroke Patients Only) Modified Rankin (Stroke Patients Only) Pre-Morbid Rankin Score: Slight disability Modified Rankin: Moderately severe disability     Balance Overall balance assessment: Needs assistance Sitting-balance support: No upper extremity supported;Feet supported Sitting balance-Leahy Scale: Fair     Standing balance support: No upper extremity supported;Single extremity supported Standing balance-Leahy Scale: Poor Standing balance comment: pt benefits from UE support or minG assist due to mild instability  Pertinent Vitals/Pain Pain Assessment: No/denies pain    Home Living  Family/patient expects to be discharged to:: Private residence Living Arrangements: Spouse/significant other;Children Available Help at Discharge: Family;Available 24 hours/day Type of Home: House Home Access: Stairs to enter Entrance Stairs-Rails: None Entrance Stairs-Number of Steps: 1 Home Layout: One level Home Equipment: Walker - 2 wheels;Cane - single point;Shower seat      Prior Function Level of Independence: Independent with assistive device(s)         Comments: pt reports ambulating with cane most often but also utilizes walker. Reports he enjoys being outside and walks outdoors most of the day     Hand Dominance        Extremity/Trunk Assessment   Upper Extremity Assessment Upper Extremity Assessment: LUE deficits/detail LUE Deficits / Details: LUE gross ROM WFL LUE Sensation: decreased light touch (tingling of L hand half way up forearm) LUE Coordination: decreased fine motor;decreased gross motor (impaired finger to nose, impaired opposition)    Lower Extremity Assessment Lower Extremity Assessment: Overall WFL for tasks assessed    Cervical / Trunk Assessment Cervical / Trunk Assessment: Normal  Communication   Communication: No difficulties  Cognition Arousal/Alertness: Awake/alert Behavior During Therapy: Impulsive Overall Cognitive Status: Impaired/Different from baseline Area of Impairment: Memory;Following commands;Safety/judgement;Awareness;Problem solving;Attention                   Current Attention Level: Focused Memory: Decreased short-term memory (pt is unable to recall history of past seizure. Unsure of how long ago his last seizure was) Following Commands: Follows one step commands consistently Safety/Judgement: Decreased awareness of safety;Decreased awareness of deficits Awareness: Emergent Problem Solving: Requires verbal cues;Requires tactile cues        General Comments General comments (skin integrity, edema, etc.): pt  showing signs of L hemianopia, can track to left side but loses sight of objects on left. Pt also consistently reporting less numbers of PT fingers seen. Pt having to scan to left side past midline to identify objects present to left of midline. Pt tachycardic up to 139 with ambulation.    Exercises     Assessment/Plan    PT Assessment Patient needs continued PT services  PT Problem List Decreased activity tolerance;Decreased balance;Decreased mobility;Decreased cognition;Decreased coordination;Decreased knowledge of use of DME;Decreased safety awareness;Decreased knowledge of precautions;Cardiopulmonary status limiting activity;Impaired sensation       PT Treatment Interventions DME instruction;Gait training;Stair training;Functional mobility training;Therapeutic activities;Therapeutic exercise;Balance training;Neuromuscular re-education;Patient/family education    PT Goals (Current goals can be found in the Care Plan section)  Acute Rehab PT Goals Patient Stated Goal: to return to independent mobility outdoors PT Goal Formulation: With patient Time For Goal Achievement: 10/21/21 Potential to Achieve Goals: Fair Additional Goals Additional Goal #1: Pt will demonstrate the awareness to consistently visually scan to left side to reduce risk of falls due to visual deficits.    Frequency Min 4X/week   Barriers to discharge        Co-evaluation               AM-PAC PT "6 Clicks" Mobility  Outcome Measure Help needed turning from your back to your side while in a flat bed without using bedrails?: None Help needed moving from lying on your back to sitting on the side of a flat bed without using bedrails?: A Little Help needed moving to and from a bed to a chair (including a wheelchair)?: A Little Help needed standing up from a chair using your arms (e.g., wheelchair or  bedside chair)?: A Little Help needed to walk in hospital room?: A Little Help needed climbing 3-5 steps with a  railing? : A Lot 6 Click Score: 18    End of Session   Activity Tolerance: Patient tolerated treatment well Patient left: in bed;with call bell/phone within reach;with bed alarm set Nurse Communication: Mobility status PT Visit Diagnosis: Unsteadiness on feet (R26.81);Other symptoms and signs involving the nervous system (R29.898)    Time: 7711-6579 PT Time Calculation (min) (ACUTE ONLY): 29 min   Charges:   PT Evaluation $PT Eval Moderate Complexity: 1 Mod          Zenaida Niece, PT, DPT Acute Rehabilitation Pager: [phone] Office [phone]   Zenaida Niece 10/07/2021, 2:23 PM

## 2021-10-07 NOTE — Progress Notes (Signed)
Pt arrived to the floor via the stretcher, alert and oriented. Cardiac monitoring initiated, VSS, fall and seizure precaution initiated: bed in the lowest position, call light within reach, floor mats applied, bed side rails padded. We'll continue to monitor.

## 2021-10-07 NOTE — Plan of Care (Signed)
POC initiated and progressing. 

## 2021-10-07 NOTE — Progress Notes (Signed)
Pt passed bedside swallow eval. Resumed pt on heart healthy diet.   Lavenia Atlas, RN

## 2021-10-07 NOTE — Discharge Summary (Addendum)
Physician Discharge Summary  Travis Salazar [id number] DOB: [birth year] DOA: 10/06/2021  PCP: Pcp, No  Admit date: 10/06/2021 Discharge date: 10/07/2021  Admitted From: Home Disposition:  Home  Recommendations for Outpatient Follow-up:  Follow up with PCP in 5 days to remove left eyebrow sutures Please obtain BMP/CBC in one week Please follow up on your primary neurologist Patient with severe visual deficits, he was instructed not to drive. I have confirmed with the daughter, patient is not driving no more. Home Health:YES Equipment/Devices: Patient already has a wheelchair and a rolling walker  Discharge Condition:Stable CODE STATUS:FULL Diet recommendation: Heart Healthy   Brief/Interim Summary:  Seizures -Patient with known history of seizures, as discussed with daughter, he is compliant with his medication, so I have discussed with Dr. Leonie Man, recommendation is to increase his Keppra to 1500 mg oral twice daily, and to follow with his primary neurologist.  Hypertension -Continue with home medications  2 cm laceration repaired by ER physicians -Instructions been given to family to follow with PCP in 5 days to remove sutures  History of PE and stroke On Eliquis which is continued.   Right lower lobe lung mass -As discussed with daughter, he has a follow-up with CT surgery in December  CT head on admission is abnormal with hypodensity in the left cerebellum, THIS IS AND ARTIFACT , there is NO brain mass, or acute infarct, as confirmed with no acute intracranial abnormality, stable noncontrast MRI .      Discharge Diagnoses:  Principal Problem:   Seizure [facility]) Active Problems:   Essential hypertension   Abnormal CT of the head   Laceration of left eyebrow   Seizures [facility])    Discharge Instructions  Discharge Instructions     Diet - low sodium heart healthy   Complete by: As directed    Diet - low sodium heart healthy   Complete by: As directed    Discharge  instructions   Complete by: As directed    Follow with Primary MD  in 7 days   Get CBC, CMP, 2 checked  by Primary MD next visit.    Activity: As tolerated with Full fall precautions use walker/cane & assistance as needed   Disposition Home    Diet: Heart Healthy .   On your next visit with your primary care physician please Get Medicines reviewed and adjusted.   Please request your Prim.MD to go over all Hospital Tests and Procedure/Radiological results at the follow up, please get all Hospital records sent to your Prim MD by signing hospital release before you go home.   If you experience worsening of your admission symptoms, develop shortness of breath, life threatening emergency, suicidal or homicidal thoughts you must seek medical attention immediately by calling 911 or calling your MD immediately  if symptoms less severe.  You Must read complete instructions/literature along with all the possible adverse reactions/side effects for all the Medicines you take and that have been prescribed to you. Take any new Medicines after you have completely understood and accpet all the possible adverse reactions/side effects.   Do not drive, operating heavy machinery, perform activities at heights, swimming or participation in water activities or provide baby sitting services if your were admitted for syncope or siezures until you have seen by Primary MD or a Neurologist and advised to do so again.  Do not drive when taking Pain medications.    Do not take more than prescribed Pain, Sleep and Anxiety Medications  Special Instructions: If you  have smoked or chewed Tobacco  in the last 2 yrs please stop smoking, stop any regular Alcohol  and or any Recreational drug use.  Wear Seat belts while driving.   Please note  You were cared for by a hospitalist during your hospital stay. If you have any questions about your discharge medications or the care you received while you were in the  hospital after you are discharged, you can call the unit and asked to speak with the hospitalist on call if the hospitalist that took care of you is not available. Once you are discharged, your primary care physician will handle any further medical issues. Please note that NO REFILLS for any discharge medications will be authorized once you are discharged, as it is imperative that you return to your primary care physician (or establish a relationship with a primary care physician if you do not have one) for your aftercare needs so that they can reassess your need for medications and monitor your lab values.   Discharge instructions   Complete by: As directed    Go to PCP to remove sutures in left eyebrow in 5 days.   Discharge wound care:   Complete by: As directed    Go to PCP to remove sutures in left eyebrow in 5 days.   Increase activity slowly   Complete by: As directed    Increase activity slowly   Complete by: As directed    No wound care   Complete by: As directed       Allergies as of 10/07/2021   No Known Allergies      Medication List     TAKE these medications    amLODipine 10 MG tablet Commonly known as: NORVASC Take 10 mg by mouth daily.   apixaban 5 MG Tabs tablet Commonly known as: ELIQUIS Take 5 mg by mouth 2 (two) times daily.   atorvastatin 40 MG tablet Commonly known as: LIPITOR Take 40 mg by mouth every evening.   levETIRAcetam 500 MG tablet Commonly known as: Keppra Take 3 tablets (1,500 mg total) by mouth 2 (two) times daily. What changed:  medication strength how much to take   metoprolol tartrate 25 MG tablet Commonly known as: LOPRESSOR Take 12.5 mg by mouth 2 (two) times daily.   tamsulosin 0.4 MG Caps capsule Commonly known as: FLOMAX Take 0.4 mg by mouth daily.               Discharge Care Instructions  (From admission, onward)           Start     Ordered   10/07/21 0000  Discharge wound care:       Comments: Go to PCP  to remove sutures in left eyebrow in 5 days.   10/07/21 1547            No Known Allergies  Consultations: neurology   Procedures/Studies: CT HEAD WO CONTRAST (5MM)  Result Date: 10/06/2021 CLINICAL DATA:  Cerebral hemorrhage suspected; Neck trauma (Age >= 65y) EXAM: CT HEAD WITHOUT CONTRAST CT CERVICAL SPINE WITHOUT CONTRAST TECHNIQUE: Multidetector CT imaging of the head and cervical spine was performed following the standard protocol without intravenous contrast. Multiplanar CT image reconstructions of the cervical spine were also generated. COMPARISON:  May 27, 2021. FINDINGS: Evaluation is limited secondary to motion. CT HEAD FINDINGS Brain: No evidence of acute hemorrhage, hydrocephalus, extra-axial collection or mass lesion/mass effect. Remote RIGHT MCA infarction with corresponding encephalomalacia. There is possible hypodensity involving the LEFT  cerebellum (series 2, image 11; series 4, image 48,). Global parenchymal volume loss. Periventricular white matter hypodensities consistent with sequela of chronic microvascular ischemic disease. Vascular: Vascular calcifications. Skull: No acute fracture within the limitations of the exam. Sinuses/Orbits: No acute finding. Mucosal thickening of the RIGHT maxillary sinus. Other: None. CT CERVICAL SPINE FINDINGS Alignment: No spondylolisthesis or acute static subluxation visualized. Subluxation of RIGHT C1-2 articulation is favored to be due to positioning. Skull base and vertebrae: No acute fracture within the limitations of motion. Soft tissues and spinal canal: No prevertebral fluid or swelling. No visible canal hematoma. Disc levels: Multilevel intervertebral disc space height loss most pronounced at C3-4, C5-6, C6-7 and C7-T1. Multilevel osseous neuroforaminal narrowing due to facet uncovertebral hypertrophy. This is most pronounced at LEFT greater than RIGHT C3-4, C4-5 and C5-6 Upper chest: Negative. Other: Atherosclerotic calcifications of  the carotids. IMPRESSION: 1. Possible hypodensity involving the LEFT cerebellum. Evaluation is limited secondary to motion artifact and streak artifact at this level. If concern for acute infarction, recommend further evaluation with dedicated MRI. 2. No acute fracture of the cervical spine within the limitations of the exam. Evaluation is limited by motion. Electronically Signed   By: Valentino Saxon M.D.   On: 10/06/2021 19:24   CT Cervical Spine Wo Contrast  Result Date: 10/06/2021 CLINICAL DATA:  Cerebral hemorrhage suspected; Neck trauma (Age >= 65y) EXAM: CT HEAD WITHOUT CONTRAST CT CERVICAL SPINE WITHOUT CONTRAST TECHNIQUE: Multidetector CT imaging of the head and cervical spine was performed following the standard protocol without intravenous contrast. Multiplanar CT image reconstructions of the cervical spine were also generated. COMPARISON:  May 27, 2021. FINDINGS: Evaluation is limited secondary to motion. CT HEAD FINDINGS Brain: No evidence of acute hemorrhage, hydrocephalus, extra-axial collection or mass lesion/mass effect. Remote RIGHT MCA infarction with corresponding encephalomalacia. There is possible hypodensity involving the LEFT cerebellum (series 2, image 11; series 4, image 48,). Global parenchymal volume loss. Periventricular white matter hypodensities consistent with sequela of chronic microvascular ischemic disease. Vascular: Vascular calcifications. Skull: No acute fracture within the limitations of the exam. Sinuses/Orbits: No acute finding. Mucosal thickening of the RIGHT maxillary sinus. Other: None. CT CERVICAL SPINE FINDINGS Alignment: No spondylolisthesis or acute static subluxation visualized. Subluxation of RIGHT C1-2 articulation is favored to be due to positioning. Skull base and vertebrae: No acute fracture within the limitations of motion. Soft tissues and spinal canal: No prevertebral fluid or swelling. No visible canal hematoma. Disc levels: Multilevel intervertebral  disc space height loss most pronounced at C3-4, C5-6, C6-7 and C7-T1. Multilevel osseous neuroforaminal narrowing due to facet uncovertebral hypertrophy. This is most pronounced at LEFT greater than RIGHT C3-4, C4-5 and C5-6 Upper chest: Negative. Other: Atherosclerotic calcifications of the carotids. IMPRESSION: 1. Possible hypodensity involving the LEFT cerebellum. Evaluation is limited secondary to motion artifact and streak artifact at this level. If concern for acute infarction, recommend further evaluation with dedicated MRI. 2. No acute fracture of the cervical spine within the limitations of the exam. Evaluation is limited by motion. Electronically Signed   By: Valentino Saxon M.D.   On: 10/06/2021 19:24   MR ANGIO HEAD WO CONTRAST  Result Date: 10/07/2021 CLINICAL DATA:  85 year old male with questionable abnormal hypodensity in the left cerebellum on head CT yesterday. Recent fall on blood thinners. Involuntary tremor. EXAM: MRA HEAD WITHOUT CONTRAST TECHNIQUE: Angiographic images of the Circle of Willis were acquired using MRA technique without intravenous contrast. COMPARISON:  Brain MRI today reported separately. CTA head 12/16/2019. FINDINGS: No  intracranial mass effect or midline shift. Right hemisphere encephalomalacia with some laminar necrosis is evident. Mild MOTSA artifact. Antegrade flow in the posterior circulation with tortuous vertebrobasilar junction. Dominant distal left vertebral artery. No vertebrobasilar stenosis identified. Normal PICA origins. Normal SCA and left PCA origins. Fetal type right PCA origin redemonstrated. Bilateral PCA branches are within normal limits. Antegrade flow in both ICA siphons. Tortuous distal cervical ICAs and siphons with mild irregularity but no hemodynamically significant stenosis identified. Normal posterior communicating artery and ophthalmic artery origins. Patent carotid termini, MCA and ACA origins. Diminutive or absent anterior communicating  artery. Visible ACA branches appear stable and within normal limits. Left MCA M1 segment and trifurcation are patent without stenosis. Visible left MCA branches appear stable and within normal limits. Right MCA M1 segment and bifurcation remain patent. Visible right MCA branches appear stable since 2020, no discrete right MCA branch occlusion identified. IMPRESSION: 1. Intracranial MRA appears stable to a 2020 CTA with no large vessel occlusion or hemodynamically significant stenosis. 2. Right hemisphere encephalomalacia with laminar necrosis. Electronically Signed   By: Genevie Ann M.D.   On: 10/07/2021 07:04   MR BRAIN WO CONTRAST  Result Date: 10/07/2021 CLINICAL DATA:  85 year old male with questionable abnormal hypodensity in the left cerebellum on head CT yesterday. Recent fall on blood thinners. Involuntary tremor. EXAM: MRI HEAD WITHOUT CONTRAST TECHNIQUE: Multiplanar, multiecho pulse sequences of the brain and surrounding structures were obtained without intravenous contrast. COMPARISON:  Head CT 10/06/2021.  Brain MRI 08/22/2021. FINDINGS: Study is mildly degraded by motion artifact despite repeated imaging attempts. No diagnostic axial T1 weighted imaging could be obtained. Brain: Chronic right MCA territory encephalomalacia with chronic petechial hemorrhage, hemosiderin. Unchanged small areas of abnormal trace diffusion which appears to be T2 shine through along the margins. Cerebellar signal remains within normal limits. No restricted diffusion to suggest acute infarction. No midline shift, mass effect, evidence of mass lesion, ventriculomegaly, extra-axial collection or acute intracranial hemorrhage. Cervicomedullary junction and pituitary are within normal limits. Stable gray and white matter signal throughout the brain. Vascular: Major intracranial vascular flow voids are stable from last month. Skull and upper cervical spine: Stable. Visualized bone marrow signal is within normal limits.  Sinuses/Orbits: Stable. Other: None. IMPRESSION: 1. No acute intracranial abnormality, stable noncontrast MRI appearance of the brain since last month. Questioned left cerebellar abnormality on recent head CT was artifact. 2. Chronic right MCA territory infarct with encephalomalacia and hemosiderin. Electronically Signed   By: Genevie Ann M.D.   On: 10/07/2021 06:59   DG Chest Port 1 View  Result Date: 10/06/2021 CLINICAL DATA:  Fall today. EXAM: PORTABLE CHEST 1 VIEW COMPARISON:  Frontal and lateral views 08/25/2021. Chest CT 05/26/2021 FINDINGS: Stable heart size. Unchanged mediastinal contours with diffuse aortic atherosclerosis and tortuosity. Mild left basilar atelectasis or scarring. No acute airspace disease. No pleural effusion or pneumothorax. Nodular densities in the right upper lobe, also seen on prior imaging. Grossly stable in radiographic appearance. No acute osseous abnormalities are seen. IMPRESSION: 1. No acute abnormality. 2. Pulmonary nodules/mass in the right upper lobe, also seen on prior imaging, grossly stable in radiographic appearance. Aortic Atherosclerosis (ICD10-I70.0). Electronically Signed   By: Keith Rake M.D.   On: 10/06/2021 18:29      Subjective:  She denies any complaints today, he had no recurrent seizures since admission.  He denies any new deficits. Discharge Exam: Vitals:   10/07/21 1336 10/07/21 1524  BP: 131/69 132/75  Pulse: 100 91  Resp: 14  15  Temp:  97.7 F (36.5 C)  SpO2: 100% 100%   Vitals:   10/07/21 0816 10/07/21 1211 10/07/21 1336 10/07/21 1524  BP: 131/64 (!) 99/52 131/69 132/75  Pulse: 93 92 100 91  Resp: 16 20 14 15   Temp: 98.3 F (36.8 C) (!) 97.4 F (36.3 C)  97.7 F (36.5 C)  TempSrc: Oral Oral  Oral  SpO2: 98% 93% 100% 100%  Weight:      Height:        General: Pt is alert, awake, not in acute distress, patient with left upper extremity weakness, and very poor vision, which is at baseline as I have confirmed with  daughter. Cardiovascular: RRR, S1/S2 +, no rubs, no gallops Respiratory: CTA bilaterally, no wheezing, no rhonchi Abdominal: Soft, NT, ND, bowel sounds + Extremities: no edema, no cyanosis    The results of significant diagnostics from this hospitalization (including imaging, microbiology, ancillary and laboratory) are listed below for reference.     Microbiology: Recent Results (from the past 240 hour(s))  Resp Panel by RT-PCR (Flu A&B, Covid) Nasopharyngeal Swab     Status: None   Collection Time: 10/06/21  9:13 PM   Specimen: Nasopharyngeal Swab; Nasopharyngeal(NP) swabs in vial transport medium  Result Value Ref Range Status   SARS Coronavirus 2 by RT PCR NEGATIVE NEGATIVE Final    Comment: (NOTE) SARS-CoV-2 target nucleic acids are NOT DETECTED.  The SARS-CoV-2 RNA is generally detectable in upper respiratory specimens during the acute phase of infection. The lowest concentration of SARS-CoV-2 viral copies this assay can detect is 138 copies/mL. A negative result does not preclude SARS-Cov-2 infection and should not be used as the sole basis for treatment or other patient management decisions. A negative result may occur with  improper specimen collection/handling, submission of specimen other than nasopharyngeal swab, presence of viral mutation(s) within the areas targeted by this assay, and inadequate number of viral copies(<138 copies/mL). A negative result must be combined with clinical observations, patient history, and epidemiological information. The expected result is Negative.  Fact Sheet for Patients:  EntrepreneurPulse.com.au  Fact Sheet for Healthcare Providers:  IncredibleEmployment.be  This test is no t yet approved or cleared by the [place] FDA and  has been authorized for detection and/or diagnosis of SARS-CoV-2 by FDA under an Emergency Use Authorization (EUA). This EUA will remain  in effect (meaning this test  can be used) for the duration of the COVID-19 declaration under Section 564(b)(1) of the Act, 21 U.S.C.section 360bbb-3(b)(1), unless the authorization is terminated  or revoked sooner.       Influenza A by PCR NEGATIVE NEGATIVE Final   Influenza B by PCR NEGATIVE NEGATIVE Final    Comment: (NOTE) The Xpert Xpress SARS-CoV-2/FLU/RSV plus assay is intended as an aid in the diagnosis of influenza from Nasopharyngeal swab specimens and should not be used as a sole basis for treatment. Nasal washings and aspirates are unacceptable for Xpert Xpress SARS-CoV-2/FLU/RSV testing.  Fact Sheet for Patients: EntrepreneurPulse.com.au  Fact Sheet for Healthcare Providers: IncredibleEmployment.be  This test is not yet approved or cleared by the [place] FDA and has been authorized for detection and/or diagnosis of SARS-CoV-2 by FDA under an Emergency Use Authorization (EUA). This EUA will remain in effect (meaning this test can be used) for the duration of the COVID-19 declaration under Section 564(b)(1) of the Act, 21 U.S.C. section 360bbb-3(b)(1), unless the authorization is terminated or revoked.  Performed at [facility], [place] [address],  [place], [place] [address]      Labs: BNP (last 3 results) No results for input(s): BNP in the last 8760 hours. Basic Metabolic Panel: Recent Labs  Lab 10/06/21 1818 10/07/21 0438  NA 135 134*  K 4.4 4.0  CL 102 102  CO2 25 22  GLUCOSE 115* 90  BUN 14 10  CREATININE 0.92 0.80  CALCIUM 9.6 9.2   Liver Function Tests: Recent Labs  Lab 10/06/21 1818  AST 17  ALT 11  ALKPHOS 54  BILITOT 0.4  PROT 6.7  ALBUMIN 3.1*   No results for input(s): LIPASE, AMYLASE in the last 168 hours. No results for input(s): AMMONIA in the last 168 hours. CBC: Recent Labs  Lab 10/06/21 1818 10/07/21 0438  WBC 5.9 6.3  NEUTROABS 3.5  --   HGB 11.2* 10.4*  HCT 35.4* 33.2*  MCV 90.3 89.7  PLT 212 210    Cardiac Enzymes: No results for input(s): CKTOTAL, CKMB, CKMBINDEX, TROPONINI in the last 168 hours. BNP: Invalid input(s): POCBNP CBG: Recent Labs  Lab 10/06/21 1825  GLUCAP 110*   D-Dimer No results for input(s): DDIMER in the last 72 hours. Hgb A1c No results for input(s): HGBA1C in the last 72 hours. Lipid Profile No results for input(s): CHOL, HDL, LDLCALC, TRIG, CHOLHDL, LDLDIRECT in the last 72 hours. Thyroid function studies No results for input(s): TSH, T4TOTAL, T3FREE, THYROIDAB in the last 72 hours.  Invalid input(s): FREET3 Anemia work up No results for input(s): VITAMINB12, FOLATE, FERRITIN, TIBC, IRON, RETICCTPCT in the last 72 hours. Urinalysis No results found for: COLORURINE, APPEARANCEUR, [place], [place], GLUCOSEU, [place], [place], [place], PROTEINUR, UROBILINOGEN, NITRITE, LEUKOCYTESUR Sepsis Labs Invalid input(s): PROCALCITONIN,  WBC,  LACTICIDVEN Microbiology Recent Results (from the past 240 hour(s))  Resp Panel by RT-PCR (Flu A&B, Covid) Nasopharyngeal Swab     Status: None   Collection Time: 10/06/21  9:13 PM   Specimen: Nasopharyngeal Swab; Nasopharyngeal(NP) swabs in vial transport medium  Result Value Ref Range Status   SARS Coronavirus 2 by RT PCR NEGATIVE NEGATIVE Final    Comment: (NOTE) SARS-CoV-2 target nucleic acids are NOT DETECTED.  The SARS-CoV-2 RNA is generally detectable in upper respiratory specimens during the acute phase of infection. The lowest concentration of SARS-CoV-2 viral copies this assay can detect is 138 copies/mL. A negative result does not preclude SARS-Cov-2 infection and should not be used as the sole basis for treatment or other patient management decisions. A negative result may occur with  improper specimen collection/handling, submission of specimen other than nasopharyngeal swab, presence of viral mutation(s) within the areas targeted by this assay, and inadequate number of viral copies(<138  copies/mL). A negative result must be combined with clinical observations, patient history, and epidemiological information. The expected result is Negative.  Fact Sheet for Patients:  EntrepreneurPulse.com.au  Fact Sheet for Healthcare Providers:  IncredibleEmployment.be  This test is no t yet approved or cleared by the [place] FDA and  has been authorized for detection and/or diagnosis of SARS-CoV-2 by FDA under an Emergency Use Authorization (EUA). This EUA will remain  in effect (meaning this test can be used) for the duration of the COVID-19 declaration under Section 564(b)(1) of the Act, 21 U.S.C.section 360bbb-3(b)(1), unless the authorization is terminated  or revoked sooner.       Influenza A by PCR NEGATIVE NEGATIVE Final   Influenza B by PCR NEGATIVE NEGATIVE Final    Comment: (NOTE) The Xpert Xpress SARS-CoV-2/FLU/RSV plus assay is intended as an aid in the  diagnosis of influenza from Nasopharyngeal swab specimens and should not be used as a sole basis for treatment. Nasal washings and aspirates are unacceptable for Xpert Xpress SARS-CoV-2/FLU/RSV testing.  Fact Sheet for Patients: EntrepreneurPulse.com.au  Fact Sheet for Healthcare Providers: IncredibleEmployment.be  This test is not yet approved or cleared by the [place] FDA and has been authorized for detection and/or diagnosis of SARS-CoV-2 by FDA under an Emergency Use Authorization (EUA). This EUA will remain in effect (meaning this test can be used) for the duration of the COVID-19 declaration under Section 564(b)(1) of the Act, 21 U.S.C. section 360bbb-3(b)(1), unless the authorization is terminated or revoked.  Performed at [facility], [place] [address], [place], [place] [address]      Time coordinating discharge: Over 30 minutes  SIGNED:   Phillips Climes, MD  [facility] 10/07/2021, 3:47  PM Pager   If 7PM-7AM, please contact night-coverage [email or web address] Password [email or web address]

## 2021-10-07 NOTE — TOC Transition Note (Signed)
Transition of Care [facility]) - CM/SW Discharge Note   Patient Details  Name: Travis Salazar MRN: [id number] Date of Birth: May 11, 1936  Transition of Care [facility]) CM/SW Contact:  Bartholomew Crews, RN Phone Number: [phone] 10/07/2021, 4:01 PM   Clinical Narrative:     Spoke with patient at the bedside to discuss transition planning. Discussed previous referral to [facility] for [facility] PT and OT last month. Patient advised that he did receive services, and he said that they told him that if he needed them back to call. Referral accepted by [facility] for [facility] PT and OT. Family to provide transportation home. No further TOC needs identified at this time.   Final next level of care: [place] Barriers to Discharge: No Barriers Identified   Patient Goals and CMS Choice Patient states their goals for this hospitalization and ongoing recovery are:: return home CMS Medicare.gov Compare Post Acute Care list provided to:: Patient Choice offered to / list presented to : Patient  Discharge Placement                       Discharge Plan and Services                DME Arranged: N/A DME Agency: NA       HH Arranged: OT, PT [place] Agency: [place] Date [place]: 10/07/21 Time [place]: 3112 Representative spoke with at [place]: [place] Interventions     Readmission Risk Interventions No flowsheet data found.

## 2021-10-07 NOTE — Discharge Instructions (Addendum)
   Go to PCP to remove sutures in left eyebrow in 5 days.     Follow with Primary MD  in 7 days   Get CBC, CMP, 2 checked  by Primary MD next visit.    Activity: As tolerated with Full fall precautions use walker/cane & assistance as needed   Disposition Home    Diet: Heart Healthy .   On your next visit with your primary care physician please Get Medicines reviewed and adjusted.   Please request your Prim.MD to go over all Hospital Tests and Procedure/Radiological results at the follow up, please get all Hospital records sent to your Prim MD by signing hospital release before you go home.   If you experience worsening of your admission symptoms, develop shortness of breath, life threatening emergency, suicidal or homicidal thoughts you must seek medical attention immediately by calling 911 or calling your MD immediately  if symptoms less severe.  You Must read complete instructions/literature along with all the possible adverse reactions/side effects for all the Medicines you take and that have been prescribed to you. Take any new Medicines after you have completely understood and accpet all the possible adverse reactions/side effects.   Do not drive, operating heavy machinery, perform activities at heights, swimming or participation in water activities or provide baby sitting services if your were admitted for syncope or siezures until you have seen by Primary MD or a Neurologist and advised to do so again.  Do not drive when taking Pain medications.    Do not take more than prescribed Pain, Sleep and Anxiety Medications  Special Instructions: If you have smoked or chewed Tobacco  in the last 2 yrs please stop smoking, stop any regular Alcohol  and or any Recreational drug use.  Wear Seat belts while driving.   Please note  You were cared for by a hospitalist during your hospital stay. If you have any questions about your discharge medications or the care you received  while you were in the hospital after you are discharged, you can call the unit and asked to speak with the hospitalist on call if the hospitalist that took care of you is not available. Once you are discharged, your primary care physician will handle any further medical issues. Please note that NO REFILLS for any discharge medications will be authorized once you are discharged, as it is imperative that you return to your primary care physician (or establish a relationship with a primary care physician if you do not have one) for your aftercare needs so that they can reassess your need for medications and monitor your lab values.

## 2021-10-07 NOTE — Consult Note (Signed)
Neurology Consult H&P  Dillinger Aston MR# [id number] 10/07/2021  CC: seizure  History is obtained from: patient and chart.  HPI: Travis Salazar is a 85 y.o. male PMHx as reviewed below history of HTN, PE (apixaban), stroke, seizure (levetiracetam) who prior to arrival was sitting on his couch, fell and had GTC unknown duration.  He does not have recollection of the event. While in the emergency room he was witnessed to have brief seizure-like activity in the left arm and leg on arrival.  CT head showed hypodensity in the left cerebellum.  The following information was taken from Dr. Leonidas Romberg admission note 10/06/2021:  "...seizure, recent PE which he is on eliquis for,  CVA, HTN who presents by EMS after falling off his couch and having a seizure. He sustained a laceration of left eyebrow which has been repaired by the ER physician.  He does not remember any inciting incidents prior to falling off the couch.  He does not member the events of falling off the couch.  Reportedly he fell and hit his left head on the ground sustaining a laceration left eyebrow and had tremors and seizure activity.  When he presented by EMS he was actively seizing and was given Ativan and Keppra in the emergency room.  Patient is now awake and alert and can answer questions.  He denies any headache, abdominal pain, chest pain, shortness of breath.  He has not had any recent fever or illness.  He has been taking his medications as prescribed but does not member the names of the medications at this time.   ED Course: Mr. Cudd has been hemodynamically stable in the emergency room.  CT of the head and neck were obtained.  There is no intracranial hemorrhage on CT scan although he does have a new hypodensity in the left cerebellum.  CT of the neck was negative for fracture or acute injury.  ER physician discussed with neurology was been consulted and will see patient.  MRI of the brain has been ordered and is pending.  The  recommendations from neurology are pending.  CMP is unremarkable.  Troponin levels were 4 and 3 in the emergency room.  CBC is unremarkable.  Hospitalist service was asked admit for further management."  ROS: A complete ROS was performed and is negative except as noted in the HPI.   Past Medical History:  Diagnosis Date   Hypertension    Seizures ([place])     History reviewed. No pertinent family history.  Social History:  has no history on file for tobacco use, alcohol use, and drug use.  Prior to Admission medications   Not on File    Exam: Current vital signs: BP (!) 149/92 (BP Location: Left Arm)   Pulse 85   Temp 98.5 F (36.9 C) (Oral)   Resp 17   Ht 5\' 8"  (1.727 m)   Wt 85.9 kg   SpO2 92%   BMI 28.79 kg/m   Physical Exam  Constitutional: Appears well-developed and well-nourished.  Psych: Affect appropriate to situation Eyes: No scleral injection HENT: No OP obstruction. Head: Normocephalic.  Cardiovascular: Normal rate and regular rhythm.  Respiratory: Effort normal, symmetric excursions bilaterally, no audible wheezing. GI: Soft.  No distension. There is no tenderness.  Skin: WDI  Neuro: Mental Status: Patient is awake, alert, oriented to person, place, month, year, and situation. Patient is able to give a clear and coherent history. Speech fluent, intact comprehension and repetition. No signs of aphasia or neglect. Visual  Fields are full. Pupils are equal, round, and reactive to light. EOMI without ptosis or diploplia.  Facial sensation is symmetric to temperature Facial movement is symmetric.  Hearing is intact to voice. Uvula midline and palate elevates symmetrically. Shoulder shrug is symmetric. Tongue is midline without atrophy or fasciculations.  Tone is normal. Bulk is normal. 5/5 strength was present in all four extremities. Sensation is symmetric to light touch and temperature in the arms and legs. Deep Tendon Reflexes: 2+ and symmetric in the  biceps and patellae. Toes are downgoing bilaterally. FNF and HKS are intact bilaterally. Gait - Deferred  I have reviewed labs in epic and the pertinent results are: Glu 115  I have reviewed the images obtained: NCT head showed Remote right MCA stroke with encephalomalacia and possible hypodensity involving the left cerebellum.   Assessment: Travis Salazar is a 85 y.o. male PMHx HTN, PE (apixaban), stroke with breakthrough seizure (levetiracetam) and CT head showing possible hypodensity in the left cerebellum and and since he has history of previous stroke, PE on apixaban, he will need stroke evaluation.  Levetiracetam 1000 mg twice daily was continued with no further seizure and he states he is back to his baseline.   Plan: - MRI brain MRA head and neck - Ordered. - Recommend TTE. - Recommend labs: HbA1c, lipid panel. - Recommend Statin if LDL > 70 - Continue apixaban. - Continue levetiracetam 1000 mg twice daily. - SBP goal <180. - Telemetry monitoring for arrhythmia. - Recommend bedside Swallow screen. - Recommend Stroke education. - Recommend PT/OT/SLP consult.  At this time patient is back to baseline and there is no indication for EEG.  Electronically signed by:  Lynnae Sandhoff, MD Page: [phone] 10/07/2021, 3:43 AM

## 2021-10-07 NOTE — Care Management Obs Status (Signed)
[place] NOTIFICATION   Patient Details  Name: Travis Salazar MRN: [id number] Date of Birth: Feb 14, 1936   Medicare Observation Status Notification Given:  Yes    Bartholomew Crews, RN 10/07/2021, 3:48 PM

## 2021-10-07 NOTE — Evaluation (Signed)
Occupational Therapy Evaluation Patient Details Name: Travis Salazar MRN: [id number] DOB: 1936-08-25 Today's Date: 10/07/2021   History of Present Illness 85 y.o. male presents to [facility] hospital on 10/06/2021 after falling off his couch and experiencing a seizure. CT and MRI negative. PMH includes seizure, recent PE which he is on eliquis for,  CVA, HTN.   Clinical Impression   Omeed was generally mod I PTA. He lives at home with his wife in a 1 level home, 1 STE, Upon evaluation pt demonstrated poor coordination of LUE for gross motor and fine motor tasks, presenting intermittently as an alien arm. Pt requiring up to min A for ADLs due to this incoordination. Functional mobility overall is min guard for safety. Pt has baseline low vision; however he stated he continues to drive, knowing he license is not active. Pt would benefit from continued OT acutely however he has active d/c plans for home today.      Recommendations for follow up therapy are one component of a multi-disciplinary discharge planning process, led by the attending physician.  Recommendations may be updated based on patient status, additional functional criteria and insurance authorization.   Follow Up Recommendations  Home health OT;Supervision/Assistance - 24 hour (24/7 direct phsycial assistance)    Equipment Recommendations  None recommended by OT       Precautions / Restrictions Precautions Precautions: Fall Precaution Comments: visual deficits Restrictions Weight Bearing Restrictions: No      Mobility Bed Mobility Overal bed mobility: Needs Assistance Bed Mobility: Supine to Sit     Supine to sit: Supervision     General bed mobility comments: pt requires multiple verbal cues, initially attempting to sit on left side of bed despite PT cues to sit on right side    Transfers Overall transfer level: Needs assistance Equipment used: None Transfers: Sit to/from Stand Sit to Stand: Min guard          General transfer comment: assistance for safety    Balance Overall balance assessment: Needs assistance Sitting-balance support: No upper extremity supported;Feet supported Sitting balance-Leahy Scale: Fair     Standing balance support: No upper extremity supported Standing balance-Leahy Scale: Fair Standing balance comment: pt benefits from UE support or minG assist due to mild instability                           ADL either performed or assessed with clinical judgement   ADL Overall ADL's : Needs assistance/impaired Eating/Feeding: Independent   Grooming: Min guard;Standing   Upper Body Bathing: Minimal assistance;Sitting   Lower Body Bathing: Minimal assistance;Sit to/from stand   Upper Body Dressing : Minimal assistance;Sitting   Lower Body Dressing: Minimal assistance;Sit to/from stand   Toilet Transfer: Min guard;Ambulation   Toileting- Clothing Manipulation and Hygiene: Supervision/safety;Sitting/lateral lean       Functional mobility during ADLs: Min guard General ADL Comments: Pt required min A due to incoordination of LUE     Vision Baseline Vision/History: 3 Glaucoma Ability to See in Adequate Light: 2 Moderately impaired Patient Visual Report: No change from baseline Vision Assessment?: Vision impaired- to be further tested in functional context     Perception     Praxis      Pertinent Vitals/Pain Pain Assessment: No/denies pain     Hand Dominance Right   Extremity/Trunk Assessment Upper Extremity Assessment Upper Extremity Assessment: LUE deficits/detail LUE Deficits / Details: LUE gross PROM WFL; pt alien arm since stroke, requried incrased time and  concentration to coordinate movement. Deminished sensation LUE Sensation: decreased light touch LUE Coordination: decreased fine motor;decreased gross motor   Lower Extremity Assessment Lower Extremity Assessment: Overall WFL for tasks assessed   Cervical / Trunk  Assessment Cervical / Trunk Assessment: Normal   Communication Communication Communication: No difficulties   Cognition Arousal/Alertness: Awake/alert Behavior During Therapy: Impulsive Overall Cognitive Status: No family/caregiver present to determine baseline cognitive functioning Area of Impairment: Following commands;Safety/judgement;Awareness;Problem solving                   Current Attention Level: Sustained Memory: Decreased short-term memory Following Commands: Follows one step commands consistently Safety/Judgement: Decreased awareness of safety;Decreased awareness of deficits Awareness: Emergent Problem Solving: Requires verbal cues;Requires tactile cues General Comments: Limited insight into safety and deficits. Pt with severe visual deficits at baseline, and poor LUE coordination - states that he has been driving anyways "with no problem"   General Comments  VSS on RA; this is liekly close to pt's baseline    Exercises     Shoulder Instructions      Home Living Family/patient expects to be discharged to:: Private residence Living Arrangements: Spouse/significant other;Children Available Help at Discharge: Family;Available 24 hours/day Type of Home: House Home Access: Stairs to enter [employer] of Steps: 1 Entrance Stairs-Rails: None Home Layout: One level               Home Equipment: Walker - 2 wheels;Cane - single point;Shower seat          Prior Functioning/Environment Level of Independence: Independent with assistive device(s)        Comments: pt reports ambulating with cane most often but also utilizes walker. Reports he enjoys being outside and walks outdoors most of the day        OT Problem List: Decreased activity tolerance;Impaired balance (sitting and/or standing);Decreased safety awareness;Decreased knowledge of use of DME or AE;Decreased knowledge of precautions;Impaired UE functional use      OT  Treatment/Interventions:      OT Goals(Current goals can be found in the care plan section) Acute Rehab OT Goals Patient Stated Goal: to return to independent mobility outdoors OT Goal Formulation: All assessment and education complete, DC therapy  OT Frequency:     Barriers to D/C:            Co-evaluation              AM-PAC OT "6 Clicks" Daily Activity     Outcome Measure Help from another person eating meals?: None Help from another person taking care of personal grooming?: A Little Help from another person toileting, which includes using toliet, bedpan, or urinal?: A Little Help from another person bathing (including washing, rinsing, drying)?: A Little Help from another person to put on and taking off regular upper body clothing?: A Little Help from another person to put on and taking off regular lower body clothing?: A Little 6 Click Score: 19   End of Session Nurse Communication: Mobility status  Activity Tolerance: Patient tolerated treatment well Patient left: in bed;with call bell/phone within reach;with bed alarm set  OT Visit Diagnosis: Unsteadiness on feet (R26.81);Other abnormalities of gait and mobility (R26.89);Muscle weakness (generalized) (M62.81)                Time: 7494-4967 OT Time Calculation (min): 14 min Charges:  OT General Charges $OT Visit: 1 Visit OT Evaluation $OT Eval Moderate Complexity: 1 Mod   Ginni Eichler A Rosamund Nyland 10/07/2021, 4:13 PM

## 2021-10-07 NOTE — Care Management CC44 (Signed)
Condition Code 44 Documentation Completed  Patient Details  Name: Travis Salazar MRN: [id number] Date of Birth: 13-May-1936   Condition Code 44 given:  Yes Patient signature on Condition Code 44 notice:  Yes Documentation of 2 MD's agreement:  Yes Code 44 added to claim:  Yes    Bartholomew Crews, RN 10/07/2021, 3:48 PM

## 2021-10-07 NOTE — Progress Notes (Signed)
D/c telel and Ivs. Went over AVS with pt and his daughter, Lucretia Kern and all questions were addressed.   Lavenia Atlas, RN

## 2021-10-07 NOTE — Progress Notes (Addendum)
Subjective: Patient is lying in bed in NAD. No family present at bedside. Patient is confused.  She presented with 2 witnessed episodes of seizures.  CT scan had regular concern of hypodensity in the cerebellum possible stroke however MRI brain negative for acute stroke.  Exam: Vitals:   10/07/21 1336 10/07/21 1524  BP: 131/69 132/75  Pulse: 100 91  Resp: 14 15  Temp:  97.7 F (36.5 C)  SpO2: 100% 100%    Temp:  [97.4 F (36.3 C)-98.5 F (36.9 C)] 97.7 F (36.5 C) (10/23 1524) Pulse Rate:  [46-112] 91 (10/23 1524) Resp:  [5-28] 15 (10/23 1524) BP: (91-154)/(52-101) 132/75 (10/23 1524) SpO2:  [92 %-100 %] 100 % (10/23 1524) Weight:  [84.4 kg-85.9 kg] 85.9 kg (10/23 0239)  General - Well nourished, well developed pleasant elderly male increased, in no apparent distress.  Ophthalmologic - fundi not visualized due to noncooperation.  Cardiovascular - Regular rhythm and rate.  Mental Status -  He is awake, alert and confused.speech is clear and no aphasia noted. Face is symmetric, Pupils are equal and reactive, no blink to threat on Left.   Motor Strength - Left arm may be slighter weaker than right. Bulk was normal and fasciculations were absent.   Motor Tone - Muscle tone was assessed at the neck and appendages and was normal  Sensory - Light touch, temperature/pinprick were assessed and were symmetrical.    Coordination - The patient had normal movements in the hands and feet with no ataxia or dysmetria.  Tremor was absent.  Gait and Station - deferred.   Focal Seizures CTH Possible hypodensity involving the LEFT cerebellum MRI No acute intracranial abnormality. Chronic right MCA territory infarct with encephalomalacia and hemosiderin. Increase Keppra to 1500mg  BID  Neurology will sign off. Please call with any questions or concerns Travis Gandy, NP   I have personally obtained history,examined this patient, reviewed notes, independently viewed imaging studies,  participated in medical decision making and plan of care.ROS completed by me personally and pertinent positives fully documented  I have made any additions or clarifications directly to the above note. Agree with note above.  Patient presented with 2 episodes of witnessed seizures and CT scan had raised concern for hypodensity in the cerebellum possible stroke however MRI shows no acute infarct.  Recommend increase dose of Keppra to 1500 mg twice daily.  Patient may be discharged home.  Follow-up as an outpatient neurology clinic for seizures.  No further stroke related testing is necessary.  Long discussion with patient and with Dr. Emeline Gins.  Greater than 50% time during this 35-minute visit was spent in counseling and coordination of care and discussion about seizures seizure medications and answering questions.  Stroke team will sign off.  Kindly call for questions  Travis Contras, MD Medical Director [place] Pager: [phone] 10/07/2021 4:24 PM

## 2021-10-08 ENCOUNTER — Ambulatory Visit: Payer: Self-pay | Admitting: *Deleted

## 2021-10-08 ENCOUNTER — Encounter: Payer: Self-pay | Admitting: Vascular Surgery

## 2021-10-08 NOTE — Telephone Encounter (Signed)
Reason for Disposition  [1] Acting confused (e.g., disoriented, slurred speech) AND [2] brief (now gone)  Answer Assessment - Initial Assessment Questions 1. LEVEL OF CONSCIOUSNESS: "How is he (she, the patient) acting right now?" (e.g., alert-oriented, confused, lethargic, stuporous, comatose)     Confused- patient fell Saturday evening- hit head over eye.  2. ONSET: "When did the confusion start?"  (minutes, hours, days)     When he arrived home- he was confused 3. PATTERN "Does this come and go, or has it been constant since it started?"  "Is it present now?"     Comes and goes- patient seems angry 4. ALCOHOL or DRUGS: "Has he been drinking alcohol or taking any drugs?"      no 5. NARCOTIC MEDICATIONS: "Has he been receiving any narcotic medications?" (e.g., morphine, Vicodin)     no 6. CAUSE: "What do you think is causing the confusion?"      Unsure- patient is to increased his seizure medication 7. OTHER SYMPTOMS: "Are there any other symptoms?" (e.g., difficulty breathing, headache, fever, weakness)     Weakness, unable to feed himself  Protocols used: Confusion - Delirium-A-AH

## 2021-10-08 NOTE — Telephone Encounter (Signed)
Patient's wife is calling to report patient fell and hit head Saturday. Patient was hospitalized over night and returned to home Sunday. Wife is concerned that patient is confused and disoriented, unable to feed himself. Wife thinks he should have been sent to rehab - but he is home now and she does not know how to handle things. She is requesting help from the office for evaluation- patient will need stitch removal as well. Patient has not been up this morning yet- so status today is unknown. Wife advised to call office with changes- will send note over for provider review and hospital follow up for stitch removal and possible evaluation of patient for safety in the home. Wife seems very overwhelmed with the cahnges in patient mental status.

## 2021-10-09 ENCOUNTER — Telehealth: Payer: Self-pay

## 2021-10-09 NOTE — Progress Notes (Signed)
Unsuccessful telephone outreach attempt made.I left a voice message for patient providing contact information and requesting a return call to assist with re-scheduling an initial visit with the Clinical  Pharmacist  [place] Pharmacist Assistant [phone]

## 2021-10-10 ENCOUNTER — Telehealth: Payer: Medicare Other

## 2021-10-10 ENCOUNTER — Telehealth: Payer: Self-pay

## 2021-10-10 NOTE — Telephone Encounter (Signed)
Please advise - appt?

## 2021-10-10 NOTE — Telephone Encounter (Signed)
Copied from [employer] [id number]. Topic: General - Other >> Oct 10, 2021  9:28 AM Celene Kras wrote: Reason for CRM: Pts wife called stating that the pt had stitches above his eye that he is needing to have removed by 10/12/21. Please advise.

## 2021-10-15 DIAGNOSIS — E7800 Pure hypercholesterolemia, unspecified: Secondary | ICD-10-CM

## 2021-10-15 DIAGNOSIS — I10 Essential (primary) hypertension: Secondary | ICD-10-CM

## 2021-10-16 ENCOUNTER — Telehealth: Payer: Medicare Other | Admitting: *Deleted

## 2021-10-17 ENCOUNTER — Telehealth: Payer: Self-pay | Admitting: *Deleted

## 2021-10-17 NOTE — Chronic Care Management (AMB) (Signed)
  Chronic Care Management   Note  10/17/2021 Name: Travis Salazar. MRN: [id number] DOB: 06-13-1936  Travis Lora. is a 85 y.o. year old male who is a primary care patient of Pcp, No. [name]. is currently enrolled in care management services. An additional referral for Pharm D was placed.   Follow up plan: Telephone appointment with care management team member scheduled for: 11/02/2021  Julian Hy, [place] Management  Direct Dial: [phone]

## 2021-10-18 ENCOUNTER — Ambulatory Visit: Payer: Medicare Other | Admitting: *Deleted

## 2021-10-18 DIAGNOSIS — E7800 Pure hypercholesterolemia, unspecified: Secondary | ICD-10-CM

## 2021-10-18 DIAGNOSIS — I10 Essential (primary) hypertension: Secondary | ICD-10-CM

## 2021-10-18 DIAGNOSIS — G40909 Epilepsy, unspecified, not intractable, without status epilepticus: Secondary | ICD-10-CM

## 2021-10-18 DIAGNOSIS — I2699 Other pulmonary embolism without acute cor pulmonale: Secondary | ICD-10-CM

## 2021-10-18 NOTE — Patient Instructions (Signed)
Visit Information  PATIENT GOALS/PLAN OF CARE:  Care Plan : General Social Work (Adult)  Updates made by [employer], Darla Lesches, LCSW since 10/18/2021 12:00 AM     Problem: CHL AMB "PATIENT-SPECIFIC PROBLEM"   Note:   CARE PLAN ENTRY (see longitudinal plan of care for additional care plan information)  Current Barriers:  Patient with HTN in need of assistance with connection to community resources  Knowledge deficits and need for support, education and care coordination related to community resources support  ADL IADL limitations and Limited access to caregiver  Clinical Goal(s)  Over the next 90 days, patient will work with the Department of Human Services to follow up on referral for CAP services  Interventions provided by LCSW: Assessed patient's care coordination needs related to need for in home support due to ADL and IADL limitations-discussed ongoing care management follow up  Patient's daughter remains primary caregiver and actively assists with daily care and medical follow up Confirmed that patient has had 2 additional hospital stays, had a fall followed by a seizure Patient's daughter confirmed that CAPD-DA services have been approved-next step will be in home assessment-service will provide more assistance in the home to promote supervision and safety Family conflicts discussed, coping strategies explored Caregiver stress acknowledged Positive reinforcement provided for motivation to provide care and close follow up with the CAP-DA program   Patient Self Care Activities & Deficits:  Patient is unable to independently navigate community resource options without care coordination support  Acknowledges deficits and is motivated to resolve concern  Unable to perform ADLs independently Unable to perform IADLs independently Patient's daughter available for support, however lives in [place], [place]  Please see past updates related to this goal by clicking on the "Past Updates" button  in the selected goal         The patient verbalized understanding of instructions, educational materials, and care plan provided today and declined offer to receive copy of patient instructions, educational materials, and care plan.   Telephone follow up appointment with care management team member scheduled for:   Elliot Gurney, [place] Worker  [facility] [phone]

## 2021-10-18 NOTE — Chronic Care Management (AMB) (Signed)
Chronic Care Management    Clinical Social Work Note  10/18/2021 Name: Travis Salazar. MRN: [id number] DOB: August 27, 1936  Travis Salazar. is a 85 y.o. year old male who is a primary care patient of Pcp, No. The CCM team was consulted to assist the patient with chronic disease management and/or care coordination needs related to: [employer] .   Collaboration with patient's daughter  for follow up visit in response to provider referral for social work chronic care management and care coordination services.   Consent to Services:  The patient was given information about Chronic Care Management services, agreed to services, and gave verbal consent prior to initiation of services.  Please see initial visit note for detailed documentation.   Patient agreed to services and consent obtained.   Assessment: Review of patient past medical history, allergies, medications, and health status, including review of relevant consultants reports was performed today as part of a comprehensive evaluation and provision of chronic care management and care coordination services.     SDOH (Social Determinants of Health) assessments and interventions performed:    Advanced Directives Status: Not addressed in this encounter.  CCM Care Plan  No Known Allergies  Outpatient Encounter Medications as of 10/18/2021  Medication Sig   allopurinol (ZYLOPRIM) 300 MG tablet Take 300 mg by mouth daily as needed.   amLODipine (NORVASC) 10 MG tablet TAKE 1 TABLET BY MOUTH EVERY DAY   amLODipine (NORVASC) 10 MG tablet Take 10 mg by mouth daily.   apixaban (ELIQUIS) 5 MG TABS tablet Take 5 mg by mouth 2 (two) times daily.   APIXABAN (ELIQUIS) VTE STARTER PACK (10MG  AND 5MG ) Take as directed on package: start with two-5mg  tablets twice daily for 7 days. On day 8, switch to one-5mg  tablet twice daily.   atorvastatin (LIPITOR) 40 MG tablet TAKE 1 TABLET (40 MG TOTAL) BY MOUTH DAILY AT 6 PM.   atorvastatin (LIPITOR) 40 MG  tablet Take 40 mg by mouth every evening.   bethanechol (URECHOLINE) 10 MG tablet Take 1 tablet (10 mg total) by mouth 3 (three) times daily.   levETIRAcetam (KEPPRA) 1000 MG tablet Take 1 tablet (1,000 mg total) by mouth 2 (two) times daily.   levETIRAcetam (KEPPRA) 500 MG tablet Take 3 tablets (1,500 mg total) by mouth 2 (two) times daily.   metoprolol tartrate (LOPRESSOR) 25 MG tablet Take one-half tablet (12.5 mg total) by mouth 2 (two) times daily.   metoprolol tartrate (LOPRESSOR) 25 MG tablet Take 12.5 mg by mouth 2 (two) times daily.   tamsulosin (FLOMAX) 0.4 MG CAPS capsule Take 1 capsule (0.4 mg total) by mouth daily.   tamsulosin (FLOMAX) 0.4 MG CAPS capsule Take 0.4 mg by mouth daily.   No facility-administered encounter medications on file as of 10/18/2021.    Patient Active Problem List   Diagnosis Date Noted   Seizures ([place]) 10/07/2021   Seizure ([place]) 10/06/2021   Essential hypertension 10/06/2021   Abnormal CT of the head 10/06/2021   Laceration of left eyebrow 10/06/2021   Aortic stenosis 09/11/2021   Seizure disorder ([place]) 08/29/2021   DVT (deep venous thrombosis) ([place]) 08/25/2021   Acute urinary retention 08/18/2021   Status epilepticus ([place]) 08/13/2021   Acute pulmonary embolism without acute cor pulmonale ([place]) 05/27/2021   Demand ischemia ([place]) 05/27/2021   CAD (coronary artery disease) 05/27/2021   Emphysema lung ([place]) 05/27/2021   Acute left hemiparesis ([place]) 04/30/2020   Paroxysmal supraventricular tachycardia ([place]) 04/30/2020   Right lower lobe lung  mass 01/07/2020   CVA (cerebral vascular accident) ([place]) 06/26/2019   Wrist swelling, right 06/03/2017   S/P prostatectomy 10/08/2016   Snores 10/08/2016   Essential (primary) hypertension 09/27/2015   History of tobacco use 09/27/2015   Hypercholesterolemia 09/27/2015   Blood glucose elevated 09/27/2015    Conditions to be addressed/monitored:  Essential (primary) hypertension     Hypercholesterolemia      Acute pulmonary embolism without acute cor pulmonale, unspecified pulmonary embolism type ([place])     ;Limited social support and ADL IADL limitations    Care Plan : General Social Work (Adult)  Updates made by [employer], Darla Lesches, LCSW since 10/18/2021 12:00 AM     Problem: CHL AMB "PATIENT-SPECIFIC PROBLEM"   Note:   CARE PLAN ENTRY (see longitudinal plan of care for additional care plan information)  Current Barriers:  Patient with HTN in need of assistance with connection to community resources  Knowledge deficits and need for support, education and care coordination related to community resources support  ADL IADL limitations and Limited access to caregiver  Clinical Goal(s)  Over the next 90 days, patient will work with the Department of Human Services to follow up on referral for CAP services  Interventions provided by LCSW: Assessed patient's care coordination needs related to need for in home support due to ADL and IADL limitations-discussed ongoing care management follow up  Patient's daughter remains primary caregiver and actively assists with daily care and medical follow up Confirmed that patient has had 2 additional hospital stays, had a fall followed by a seizure Patient's daughter confirmed that CAPD-DA services have been approved-next step will be in home assessment-service will provide more assistance in the home to promote supervision and safety Family conflicts discussed, coping strategies explored Caregiver stress acknowledged Positive reinforcement provided for motivation to provide care and close follow up with the CAP-DA program   Patient Self Care Activities & Deficits:  Patient is unable to independently navigate community resource options without care coordination support  Acknowledges deficits and is motivated to resolve concern  Unable to perform ADLs independently Unable to perform IADLs independently Patient's daughter available for support, however  lives in [place], [place]  Please see past updates related to this goal by clicking on the "Past Updates" button in the selected goal         Follow Up Plan: SW will follow up with patient by phone over the next 14 business days       [place], [place] Worker  [facility] [phone]

## 2021-10-19 ENCOUNTER — Ambulatory Visit: Payer: Self-pay | Admitting: *Deleted

## 2021-10-19 ENCOUNTER — Telehealth: Payer: Medicare Other

## 2021-10-19 NOTE — Telephone Encounter (Signed)
Patient's wife called and requesting to verify if appt was made for patient to have PCP remove "stitches" . Patient's wife reports she thinks patient has "glue" instead of stitches. No appt noted for patient for removal of "stitches" at this time. Wife verbalized understanding and will call back if needed.

## 2021-10-19 NOTE — Telephone Encounter (Signed)
Reason for Disposition  General information question, no triage required and triager able to answer question  Answer Assessment - Initial Assessment Questions 1. REASON FOR CALL or QUESTION: "What is your reason for calling today?" or "How can I best help you?" or "What question do you have that I can help answer?"     Patient's wife wanted to verify if appt was made for patient to have "stitches' removed.  Protocols used: Information Only Call - No Triage-A-AH

## 2021-10-25 ENCOUNTER — Other Ambulatory Visit: Payer: Self-pay | Admitting: Family Medicine

## 2021-10-25 DIAGNOSIS — I10 Essential (primary) hypertension: Secondary | ICD-10-CM

## 2021-10-25 NOTE — Telephone Encounter (Signed)
Requested medications are due for refill today.  yes  Requested medications are on the active medications list.  yes  Last refill. 2 rx's for the same medication 04/18/2021 &07/23/2021  Future visit scheduled.   Not with  provider  Notes to clinic.  PCP is listed as none.

## 2021-10-30 ENCOUNTER — Telehealth: Payer: Medicare Other

## 2021-10-31 ENCOUNTER — Ambulatory Visit: Payer: Medicare Other | Admitting: *Deleted

## 2021-10-31 DIAGNOSIS — I2699 Other pulmonary embolism without acute cor pulmonale: Secondary | ICD-10-CM

## 2021-10-31 DIAGNOSIS — I69354 Hemiplegia and hemiparesis following cerebral infarction affecting left non-dominant side: Secondary | ICD-10-CM | POA: Diagnosis not present

## 2021-10-31 DIAGNOSIS — Z86711 Personal history of pulmonary embolism: Secondary | ICD-10-CM | POA: Diagnosis not present

## 2021-10-31 DIAGNOSIS — I69398 Other sequelae of cerebral infarction: Secondary | ICD-10-CM | POA: Diagnosis not present

## 2021-10-31 DIAGNOSIS — H547 Unspecified visual loss: Secondary | ICD-10-CM | POA: Diagnosis not present

## 2021-10-31 DIAGNOSIS — Z87891 Personal history of nicotine dependence: Secondary | ICD-10-CM | POA: Diagnosis not present

## 2021-10-31 DIAGNOSIS — R918 Other nonspecific abnormal finding of lung field: Secondary | ICD-10-CM | POA: Diagnosis not present

## 2021-10-31 DIAGNOSIS — I10 Essential (primary) hypertension: Secondary | ICD-10-CM | POA: Diagnosis not present

## 2021-10-31 DIAGNOSIS — G40909 Epilepsy, unspecified, not intractable, without status epilepticus: Secondary | ICD-10-CM

## 2021-10-31 DIAGNOSIS — Z7901 Long term (current) use of anticoagulants: Secondary | ICD-10-CM | POA: Diagnosis not present

## 2021-10-31 DIAGNOSIS — Z9181 History of falling: Secondary | ICD-10-CM | POA: Diagnosis not present

## 2021-10-31 DIAGNOSIS — I700 Atherosclerosis of aorta: Secondary | ICD-10-CM | POA: Diagnosis not present

## 2021-10-31 DIAGNOSIS — M4802 Spinal stenosis, cervical region: Secondary | ICD-10-CM | POA: Diagnosis not present

## 2021-10-31 DIAGNOSIS — E785 Hyperlipidemia, unspecified: Secondary | ICD-10-CM | POA: Diagnosis not present

## 2021-10-31 DIAGNOSIS — E7800 Pure hypercholesterolemia, unspecified: Secondary | ICD-10-CM

## 2021-10-31 DIAGNOSIS — M8938 Hypertrophy of bone, other site: Secondary | ICD-10-CM | POA: Diagnosis not present

## 2021-11-01 ENCOUNTER — Telehealth: Payer: Self-pay

## 2021-11-01 NOTE — Chronic Care Management (AMB) (Signed)
Chronic Care Management    Clinical Social Work Note  11/02/2021 Name: Travis Can. MRN: [id number] DOB: [birth year]  Travis Lora. is a 85 y.o. year old male who is a primary care patient of Pcp, No. The CCM team was consulted to assist the patient with chronic disease management and/or care coordination needs related to: [employer]  and Level of Care Concerns.   Collaboration with patient's daughter Travis Salazar  for follow up visit in response to provider referral for social work chronic care management and care coordination services.   Consent to Services:  The patient was given information about Chronic Care Management services, agreed to services, and gave verbal consent prior to initiation of services.  Please see initial visit note for detailed documentation.   Patient agreed to services and consent obtained.   Assessment: Review of patient past medical history, allergies, medications, and health status, including review of relevant consultants reports was performed today as part of a comprehensive evaluation and provision of chronic care management and care coordination services.     SDOH (Social Determinants of Health) assessments and interventions performed:    Advanced Directives Status: Not addressed in this encounter.  CCM Care Plan  No Known Allergies  Outpatient Encounter Medications as of 10/31/2021  Medication Sig   allopurinol (ZYLOPRIM) 300 MG tablet Take 300 mg by mouth daily as needed.   amLODipine (NORVASC) 10 MG tablet TAKE 1 TABLET BY MOUTH EVERY DAY   apixaban (ELIQUIS) 5 MG TABS tablet Take 5 mg by mouth 2 (two) times daily.   atorvastatin (LIPITOR) 40 MG tablet TAKE 1 TABLET (40 MG TOTAL) BY MOUTH DAILY AT 6 PM.   levETIRAcetam (KEPPRA) 500 MG tablet Take 3 tablets (1,500 mg total) by mouth 2 (two) times daily.   tamsulosin (FLOMAX) 0.4 MG CAPS capsule Take 0.4 mg by mouth daily.   [DISCONTINUED] amLODipine (NORVASC) 10 MG tablet Take 10 mg by mouth  daily.   [DISCONTINUED] APIXABAN (ELIQUIS) VTE STARTER PACK (10MG  AND 5MG ) Take as directed on package: start with two-5mg  tablets twice daily for 7 days. On day 8, switch to one-5mg  tablet twice daily.   [DISCONTINUED] atorvastatin (LIPITOR) 40 MG tablet Take 40 mg by mouth every evening.   [DISCONTINUED] bethanechol (URECHOLINE) 10 MG tablet Take 1 tablet (10 mg total) by mouth 3 (three) times daily.   [DISCONTINUED] metoprolol tartrate (LOPRESSOR) 25 MG tablet Take 12.5 mg by mouth 2 (two) times daily.   No facility-administered encounter medications on file as of 10/31/2021.    Patient Active Problem List   Diagnosis Date Noted   Seizures ([place]) 10/07/2021   Seizure ([place]) 10/06/2021   Essential hypertension 10/06/2021   Abnormal CT of the head 10/06/2021   Laceration of left eyebrow 10/06/2021   Aortic stenosis 09/11/2021   Seizure disorder ([place]) 08/29/2021   DVT (deep venous thrombosis) ([place]) 08/25/2021   Acute urinary retention 08/18/2021   Status epilepticus ([place]) 08/13/2021   Acute pulmonary embolism without acute cor pulmonale ([place]) 05/27/2021   Demand ischemia ([place]) 05/27/2021   CAD (coronary artery disease) 05/27/2021   Emphysema lung ([place]) 05/27/2021   Acute left hemiparesis ([place]) 04/30/2020   Paroxysmal supraventricular tachycardia ([place]) 04/30/2020   Right lower lobe lung mass 01/07/2020   CVA (cerebral vascular accident) ([place]) 06/26/2019   Wrist swelling, right 06/03/2017   S/P prostatectomy 10/08/2016   Snores 10/08/2016   Essential (primary) hypertension 09/27/2015   History of tobacco use 09/27/2015   Hypercholesterolemia 09/27/2015   Blood  glucose elevated 09/27/2015    Conditions to be addressed/monitored:   Essential (primary) hypertension     Hypercholesterolemia     Acute pulmonary embolism without acute cor pulmonale, unspecified pulmonary embolism type ([place])     ;Limited social support and ADL IADL limitations  Care Plan : General Social Work  (Adult)  Updates made by Travis Salazar, Travis Lesches, LCSW since 11/02/2021 12:00 AM     Problem: CHL AMB "PATIENT-SPECIFIC PROBLEM"   Note:   CARE PLAN ENTRY (see longitudinal plan of care for additional care plan information)  Current Barriers:  Patient with HTN in need of assistance with connection to community resources  Knowledge deficits and need for support, education and care coordination related to community resources support  ADL IADL limitations and Limited access to caregiver  Clinical Goal(s)  Over the next 90 days, patient will work with the Department of Human Services to follow up on referral for CAP services  Interventions provided by LCSW: Assessed patient's care coordination needs related to need for in home support due to ADL and IADL limitations-discussed ongoing care management follow up -blocked calls Patient's daughter remains primary caregiver and actively assists with daily care and medical follow up Confirmed that [facility] PT has started Patient's daughter confirmed that CAPD-DA services have been approved-next step will be in home assessment-service will provide more assistance in the home to promote supervision and safety-no call to date, however patient does not answer unfamiliar calls-will need to call to provide daughters number for further follow up [facility] contacted-left message and sent secure email  with Chiropractor with the Department of Human Services-Travis Salazar to update contact information for patient(daughters number) Caregiver stress continues to be acknowledged Positive reinforcement provided for motivation to provide care and close follow up with the CAP-DA program   Patient Self Care Activities & Deficits:  Patient is unable to independently navigate community resource options without care coordination support  Acknowledges deficits and is motivated to resolve concern  Unable to perform ADLs independently Unable to perform IADLs  independently Patient's daughter available for support, however lives in [place], [place]  Please see past updates related to this goal by clicking on the "Past Updates" button in the selected goal         Follow Up Plan: SW will follow up with patient by phone over the next 14 business days       [place], [place] Worker  [facility] [phone]

## 2021-11-01 NOTE — Progress Notes (Signed)
APPOINTMENT REMINDER   Called [name], No answer, left message of appointment on 11/02/2021 at 9:00 am via telephone visit with Junius Argyle , Pharm D. Notified to have all medications, supplements, blood pressure and/or blood sugar logs available during appointment and to return call if need to reschedule.   [employer] Pharmacist Assistant [phone]

## 2021-11-02 ENCOUNTER — Ambulatory Visit: Payer: Medicare Other

## 2021-11-02 ENCOUNTER — Other Ambulatory Visit: Payer: Self-pay | Admitting: Family Medicine

## 2021-11-02 DIAGNOSIS — I10 Essential (primary) hypertension: Secondary | ICD-10-CM

## 2021-11-02 DIAGNOSIS — I2699 Other pulmonary embolism without acute cor pulmonale: Secondary | ICD-10-CM

## 2021-11-02 DIAGNOSIS — E7800 Pure hypercholesterolemia, unspecified: Secondary | ICD-10-CM

## 2021-11-02 NOTE — Patient Instructions (Signed)
Visit Information  PATIENT GOALS/PLAN OF CARE:  Care Plan : General Social Work (Adult)  Updates made by [employer], Darla Lesches, LCSW since 11/02/2021 12:00 AM     Problem: CHL AMB "PATIENT-SPECIFIC PROBLEM"   Note:   CARE PLAN ENTRY (see longitudinal plan of care for additional care plan information)  Current Barriers:  Patient with HTN in need of assistance with connection to community resources  Knowledge deficits and need for support, education and care coordination related to community resources support  ADL IADL limitations and Limited access to caregiver  Clinical Goal(s)  Over the next 90 days, patient will work with the Department of Human Services to follow up on referral for CAP services  Interventions provided by LCSW: Assessed patient's care coordination needs related to need for in home support due to ADL and IADL limitations-discussed ongoing care management follow up -blocked calls Patient's daughter remains primary caregiver and actively assists with daily care and medical follow up Confirmed that [facility] PT has started Patient's daughter confirmed that CAPD-DA services have been approved-next step will be in home assessment-service will provide more assistance in the home to promote supervision and safety-no call to date, however patient does not answer unfamiliar calls-will need to call to provide daughters number for further follow up [facility] contacted-left message and sent secure email  with Chiropractor with the Department of Human Services-Cori Hodgin to update contact information for patient(daughters number) Caregiver stress continues to be acknowledged Positive reinforcement provided for motivation to provide care and close follow up with the CAP-DA program   Patient Self Care Activities & Deficits:  Patient is unable to independently navigate community resource options without care coordination support  Acknowledges deficits and is motivated to  resolve concern  Unable to perform ADLs independently Unable to perform IADLs independently Patient's daughter available for support, however lives in [place], [place]  Please see past updates related to this goal by clicking on the "Past Updates" button in the selected goal       The patient verbalized understanding of instructions, educational materials, and care plan provided today and declined offer to receive copy of patient instructions, educational materials, and care plan.   Telephone follow up appointment with care management team member scheduled for: 11/14/21   Elliot Gurney, [place] Worker  [facility] [phone]

## 2021-11-02 NOTE — Telephone Encounter (Signed)
Requested medication (s) are due for refill today:   Not sure  Requested medication (s) are on the active medication list:   Yes from a historical provider  Future visit scheduled:   Yes with Dr. Rosanna Randy   Last ordered: 10/07/2021 by a historical provider   Returned because prescribed by a historical provider.   Requested Prescriptions  Pending Prescriptions Disp Refills   apixaban (ELIQUIS) 5 MG TABS tablet 60 tablet     Sig: Take 1 tablet (5 mg total) by mouth 2 (two) times daily.     Hematology:  Anticoagulants Failed - 11/02/2021 10:07 AM      Failed - HGB in normal range and within 360 days    Hemoglobin  Date Value Ref Range Status  10/07/2021 10.4 (L) 13.0 - 17.0 g/dL Final  08/29/2020 14.3 13.0 - 17.7 g/dL Final          Failed - HCT in normal range and within 360 days    HCT  Date Value Ref Range Status  10/07/2021 33.2 (L) 39.0 - 52.0 % Final   Hematocrit  Date Value Ref Range Status  08/29/2020 43.3 37.5 - 51.0 % Final          Passed - PLT in normal range and within 360 days    Platelets  Date Value Ref Range Status  10/07/2021 210 150 - 400 K/uL Final  08/29/2020 176 150 - 450 x10E3/uL Final          Passed - Cr in normal range and within 360 days    Creatinine  Date Value Ref Range Status  01/07/2020 0.81 0.61 - 1.24 mg/dL Final   Creat  Date Value Ref Range Status  10/06/2017 0.95 0.70 - 1.18 mg/dL Final    Comment:    For patients >80 years of age, the reference limit for Creatinine is approximately 13% higher for people identified as African-American. .    Creatinine, Ser  Date Value Ref Range Status  10/07/2021 0.80 0.61 - 1.24 mg/dL Final          Passed - Valid encounter within last 12 months    Recent Outpatient Visits           1 month ago Acute deep vein thrombosis (DVT) of left lower extremity, unspecified vein [facility])   [facility] [name], MD   4 months ago Acute pulmonary embolism without  acute cor pulmonale, unspecified pulmonary embolism type [facility])   [facility] [name], MD   8 months ago Essential (primary) hypertension   [facility] [name], MD   10 months ago Need for COVID-19 vaccine   [facility] [name], MD   1 year ago Essential (primary) hypertension   [facility] [name], MD       Future Appointments             In 3 months [name], MD [facility], [place]

## 2021-11-02 NOTE — Progress Notes (Signed)
Chronic Care Management Pharmacy Note  11/14/2021 Name:  Travis Salazar. MRN:  [id number] DOB:  02-05-1936  Summary: Patient presents for initial CCM consult with his daughter Travis Salazar. Patient has cost concerns with Eliquis.  Recommendations/Changes made from today's visit: -Start PAP for Eliquis. Patient brought in assistance paperwork, but still needs to bring in pharmacy spending report before it can be submitted and approved. Patient daughter stated she would do so and fax it into the office.   Plan: CPP follow-up 8 months to try and assist with Eliquis concerns earlier in the calendar year.   Recommended Problem List Changes:  Add: History of Pulmonary Embolism Remove:  Acute pulmonary embolism without acute cor pulmonale, unspecified pulmonary embolism type   Subjective: Travis Salazar. is an 85 y.o. year old male who is a primary patient of Pcp, No.  The CCM team was consulted for assistance with disease management and care coordination needs.    Engaged with patient by telephone for initial visit in response to provider referral for pharmacy case management and/or care coordination services.   Consent to Services:  The patient was given the following information about Chronic Care Management services today, agreed to services, and gave verbal consent: 1. CCM service includes personalized support from designated clinical staff supervised by the primary care provider, including individualized plan of care and coordination with other care providers 2. 24/7 contact phone numbers for assistance for urgent and routine care needs. 3. Service will only be billed when office clinical staff spend 20 minutes or more in a month to coordinate care. 4. Only one practitioner may furnish and bill the service in a calendar month. 5.The patient may stop CCM services at any time (effective at the end of the month) by phone call to the office staff. 6. The patient will be responsible for cost sharing  (co-pay) of up to 20% of the service fee (after annual deductible is met). Patient agreed to services and consent obtained.  Patient Care Team: Pcp, No as PCP - General Jerline Pain, MD as PCP - Cardiology (Cardiology) Pa, [place] as Consulting Physician (Optometry) Bernardo Heater, Ronda Fairly, MD as Consulting Physician (Urology) Germaine Pomfret, [facility] (Pharmacist) Cameron Sprang, MD as Consulting Physician (Neurology) [name], MD (Family Medicine)  Recent office visits: 06/20/2021 Dr.Fisher MD (PCP) AMB Referral to [place] 02/19/2021 Dr.Fisher MD (PCP) Increase hydralazine from 50 mg twice daily to 100 mg twice daily  Recent consult visits: No [facility] visits: Admitted to the hospital on 05/27/2021 due to Acute pulmonary embolism. Discharge date was 05/28/2021. Discharged from [place]?Medications Started at [facility] Discharge:?? -started Eliquis DVT/PE Starter Pack    Medication Changes at Hospital Discharge: -Changed None   Medications Discontinued at Hospital Discharge: -Stopped aspirin 325 MG EC tablet  Stop clopidogrel 75 MG tablet Stop lisinopril 40 MG tablet   Medications that remain the same after Hospital Discharge:??  -All other medications will remain the same   Objective:  Lab Results  Component Value Date   CREATININE 0.80 10/07/2021   BUN 10 10/07/2021   GFRNONAA >60 10/07/2021   GFRAA 86 08/29/2020   NA 134 (L) 10/07/2021   K 4.0 10/07/2021   CALCIUM 9.2 10/07/2021   CO2 22 10/07/2021   GLUCOSE 90 10/07/2021    Lab Results  Component Value Date/Time   HGBA1C 5.9 (H) 08/29/2020 11:35 AM   HGBA1C 5.8  04/25/2020 10:17 AM   HGBA1C 6.0 (A) 01/24/2020 11:07 AM   HGBA1C 5.5 09/21/2019 11:44 AM    Last diabetic Eye exam: No results found for: HMDIABEYEEXA  Last diabetic Foot exam: No results found for: HMDIABFOOTEX   Lab Results  Component Value Date   CHOL  187 08/29/2020   HDL 40 08/29/2020   LDLCALC 125 (H) 08/29/2020   TRIG 89 08/14/2021   CHOLHDL 4.7 08/29/2020    Hepatic Function Latest Ref Rng & Units 10/06/2021 08/26/2021 08/25/2021  Total Protein 6.5 - 8.1 g/dL 6.7 6.2(L) 7.0  Albumin 3.5 - 5.0 g/dL 3.1(L) 2.4(L) 2.9(L)  AST 15 - 41 U/L 17 31 40  ALT 0 - 44 U/L 11 30 38  Alk Phosphatase 38 - 126 U/L 54 49 57  Total Bilirubin 0.3 - 1.2 mg/dL 0.4 0.8 1.1    Lab Results  Component Value Date/Time   TSH 4.040 08/29/2020 11:35 AM   TSH 2.820 09/21/2019 11:44 AM    CBC Latest Ref Rng & Units 10/07/2021 10/06/2021 09/07/2021  WBC 4.0 - 10.5 K/uL 6.3 5.9 14.1(H)  Hemoglobin 13.0 - 17.0 g/dL 10.4(L) 11.2(L) 12.7(L)  Hematocrit 39.0 - 52.0 % 33.2(L) 35.4(L) 41.4  Platelets 150 - 400 K/uL 210 212 267    No results found for: VD25OH  Clinical ASCVD: Yes  The ASCVD Risk score (Arnett DK, et al., 2019) failed to calculate for the following reasons:   The 2019 ASCVD risk score is only valid for ages 71 to 72   The patient has a prior MI or stroke diagnosis    Depression screen [facility] 2/9 09/18/2021 06/20/2021 04/17/2020  Decreased Interest 0 0 0  Down, Depressed, Hopeless 0 0 0  PHQ - 2 Score 0 0 0  Altered sleeping - - -  Tired, decreased energy - - -  Change in appetite - - -  Feeling bad or failure about yourself  - - -  Trouble concentrating - - -  Moving slowly or fidgety/restless - - -  Suicidal thoughts - - -  PHQ-9 Score - - -  Difficult doing work/chores - - -    Social History   Tobacco Use  Smoking Status Not on file  Smokeless Tobacco Never   BP Readings from Last 3 Encounters:  10/07/21 132/75  09/28/21 118/72  09/20/21 111/69   Pulse Readings from Last 3 Encounters:  10/07/21 91  09/28/21 82  09/20/21 93   Wt Readings from Last 3 Encounters:  10/07/21 189 lb 6 oz (85.9 kg)  09/28/21 189 lb (85.7 kg)  09/20/21 192 lb 9.6 oz (87.4 kg)   BMI Readings from Last 3 Encounters:  10/07/21 28.79 kg/m   09/28/21 28.74 kg/m  09/20/21 29.28 kg/m    Assessment/Interventions: Review of patient past medical history, allergies, medications, health status, including review of consultants reports, laboratory and other test data, was performed as part of comprehensive evaluation and provision of chronic care management services.   SDOH:  (Social Determinants of Health) assessments and interventions performed: Yes SDOH Interventions    Flowsheet Row Most Recent Value  SDOH Interventions   Financial Strain Interventions Other (Comment)  [PAP]      SDOH Screenings   Alcohol Screen: Low Risk    Last Alcohol Screening Score (AUDIT): 0  Depression (PHQ2-9): Low Risk    PHQ-2 Score: 0  Financial Resource Strain: High Risk   Difficulty of Paying Living Expenses: Hard  Food Insecurity: No Food Insecurity   Worried About  Running Out of Food in the Last Year: Never true   Ran Out of Food in the Last Year: Never true  Housing: Low Risk    Last Housing Risk Score: 0  Physical Activity: Insufficiently Active   Days of Exercise per Week: 7 days   Minutes of Exercise per Session: 20 min  Social Connections: Moderately Integrated   Frequency of Communication with Friends and Family: More than three times a week   Frequency of Social Gatherings with Friends and Family: More than three times a week   Attends Religious Services: More than 4 times per year   Active Member of [employer] or Organizations: No   Attends Music therapist: Never   Marital Status: Married  Stress: No Stress Concern Present   Feeling of Stress : Not at all  Tobacco Use: Unknown   Smoking Tobacco Use: Never Assessed   Smokeless Tobacco Use: Never   Passive Exposure: Not on file  Transportation Needs: No Transportation Needs   Lack of Transportation (Medical): No   Lack of Transportation (Non-Medical): No    CCM Care Plan  No Known Allergies  Medications Reviewed Today     Reviewed by Germaine Pomfret,  [facility] (Pharmacist) on 11/02/21 at [place] List Status: <None>   Medication Order Taking? Sig Documenting Provider Last Dose Status Informant  allopurinol (ZYLOPRIM) 300 MG tablet [id number] No Take 300 mg by mouth daily as needed. [provider] Taking Active   amLODipine (NORVASC) 10 MG tablet [id number]  TAKE 1 TABLET BY MOUTH EVERY DAY [name], MD  Active   Discontinued 11/02/21 0920 (Patient Preference) apixaban (ELIQUIS) 5 MG TABS tablet [id number] No Take 5 mg by mouth 2 (two) times daily. [provider] 10/06/2021 1000 Active Family Member  Discontinued 11/02/21 0920 (Patient Preference)   atorvastatin (LIPITOR) 40 MG tablet [id number] No TAKE 1 TABLET (40 MG TOTAL) BY MOUTH DAILY AT 6 PM. [name], MD Taking Active Self  Discontinued 11/02/21 0920 (Patient Preference)   Discontinued 11/02/21 0920   levETIRAcetam (KEPPRA) 1000 MG tablet [id number] No Take 1 tablet (1,000 mg total) by mouth 2 (two) times daily. Cameron Sprang, MD Taking Expired 10/20/21 2359   levETIRAcetam (KEPPRA) 500 MG tablet [id number]  Take 3 tablets (1,500 mg total) by mouth 2 (two) times daily. Elgergawy, Silver Huguenin, MD  Active   metoprolol tartrate (LOPRESSOR) 25 MG tablet [id number] No Take one-half tablet (12.5 mg total) by mouth 2 (two) times daily. [name], MD Taking Expired 10/21/21 2359   Discontinued 11/02/21 0920 (Patient Preference) tamsulosin (FLOMAX) 0.4 MG CAPS capsule [id number] No Take 0.4 mg by mouth daily. [provider] 10/06/2021 Active Family Member            Patient Active Problem List   Diagnosis Date Noted   Seizures ([place]) 10/07/2021   Seizure ([place]) 10/06/2021   Essential hypertension 10/06/2021   Abnormal CT of the head 10/06/2021   Laceration of left eyebrow 10/06/2021   Aortic stenosis 09/11/2021   Seizure disorder ([place]) 08/29/2021   DVT (deep venous thrombosis) ([place]) 08/25/2021   Acute urinary retention 08/18/2021    Status epilepticus ([place]) 08/13/2021   Acute pulmonary embolism without acute cor pulmonale ([place]) 05/27/2021   Demand ischemia ([place]) 05/27/2021   CAD (coronary artery disease) 05/27/2021   Emphysema lung ([place]) 05/27/2021   Acute left hemiparesis ([place]) 04/30/2020   Paroxysmal supraventricular tachycardia ([place]) 04/30/2020   Right lower lobe  lung mass 01/07/2020   CVA (cerebral vascular accident) ([place]) 06/26/2019   Wrist swelling, right 06/03/2017   S/P prostatectomy 10/08/2016   Snores 10/08/2016   Essential (primary) hypertension 09/27/2015   History of tobacco use 09/27/2015   Hypercholesterolemia 09/27/2015   Blood glucose elevated 09/27/2015    Immunization History  Administered Date(s) Administered   Influenza-Unspecified 10/16/2018   PFIZER(Purple Top)SARS-COV-2 Vaccination 12/18/2020   Pneumococcal Conjugate-13 10/04/2014   Pneumococcal Polysaccharide-23 01/11/2013   Zoster, Live 11/13/2012    Conditions to be addressed/monitored:  Hypertension, Hyperlipidemia, and History of PE  Care Plan : General Pharmacy (Adult)  Updates made by Germaine Pomfret, RPH since 11/14/2021 12:00 AM     Problem: Hypertension, Hyperlipidemia, and History of PE   Priority: High     Long-Range Goal: Patient-Specific Goal   Start Date: 11/14/2021  Expected End Date: 11/14/2022  This Visit's Progress: On track  Priority: High  Note:   Current Barriers:  Unable to independently monitor therapeutic efficacy  Pharmacist Clinical Goal(s):  Patient will verbalize ability to afford treatment regimen through collaboration with PharmD and provider.   Interventions: 1:1 collaboration with Pcp, No regarding development and update of comprehensive plan of care as evidenced by provider attestation and co-signature Inter-disciplinary care team collaboration (see longitudinal plan of care) Comprehensive medication review performed; medication list updated in electronic medical  record  Hypertension (BP goal <140/90) -Controlled -Current treatment: Amlodipine 10 mg daily  Metoprolol tartrate 25 1/2 tablet twice daily -Medications previously tried: NA  -Denies hypotensive/hypertensive symptoms -Recommended to continue current medication  Hyperlipidemia: (LDL goal < 70) -Controlled -Current treatment: Atorvastatin 40 mg daily  -Medications previously tried: NA  -Recommended to continue current medication  History of PE (Goal: Prevent stroke/embolism) -Not ideally controlled -Current treatment  Eliquis 5 mg twice daily  -Medications previously tried: NA  -Cost concerns with affording Eliquis -Recommended to continue current medication -Start PAP for Eliquis. Patient brought in assistance paperwork, but still needs to bring in pharmacy spending report before it can be submitted and approved. Patient daughter stated she would do so and fax it into the office.   Patient Goals/Self-Care Activities Patient will:  - check blood pressure weekly, document, and provide at future appointments  Follow Up Plan: Telephone follow up appointment with care management team member scheduled for:  06/17/2022 at 3:00 PM       Medication Assistance: Application for Eliquis  medication assistance program. in process.  Anticipated assistance start date TBD.  See plan of care for additional detail.  Patient's preferred pharmacy is:  [facility] [address] [place] [address] Phone: [phone] Fax: [phone]  [id number] [place] [id number]: [phone]: [phone] Care Plan and Follow Up Patient Decision:  Patient agrees to Care Plan and Follow-up.  Plan: Telephone follow up appointment with care management team member scheduled for:  06/17/2022 at 3:00 PM  AJunius Argyle PharmD, BPara March CD[phone]

## 2021-11-02 NOTE — Telephone Encounter (Signed)
Medication Refill - Medication:  apixaban (ELIQUIS) 5 MG TABS tablet   Has the patient contacted their pharmacy? Yes.   Contact PCP  Preferred Pharmacy (with phone number or street name):  [id number] [place] [place]  Phone:  [phone] Fax:  [phone]   Has the patient been seen for an appointment in the last year OR does the patient have an upcoming appointment? Yes.    Agent: Please be advised that RX refills may take up to 3 business days. We ask that you follow-up with your pharmacy.

## 2021-11-05 MED ORDER — APIXABAN 5 MG PO TABS: 5.0000 mg | ORAL_TABLET | Freq: Two times a day (BID) | ORAL | 3 refills | Status: DC

## 2021-11-07 DIAGNOSIS — I10 Essential (primary) hypertension: Secondary | ICD-10-CM | POA: Diagnosis not present

## 2021-11-07 DIAGNOSIS — I69354 Hemiplegia and hemiparesis following cerebral infarction affecting left non-dominant side: Secondary | ICD-10-CM | POA: Diagnosis not present

## 2021-11-07 DIAGNOSIS — I700 Atherosclerosis of aorta: Secondary | ICD-10-CM | POA: Diagnosis not present

## 2021-11-07 DIAGNOSIS — Z86711 Personal history of pulmonary embolism: Secondary | ICD-10-CM | POA: Diagnosis not present

## 2021-11-07 DIAGNOSIS — R918 Other nonspecific abnormal finding of lung field: Secondary | ICD-10-CM | POA: Diagnosis not present

## 2021-11-07 DIAGNOSIS — Z87891 Personal history of nicotine dependence: Secondary | ICD-10-CM | POA: Diagnosis not present

## 2021-11-07 DIAGNOSIS — H547 Unspecified visual loss: Secondary | ICD-10-CM | POA: Diagnosis not present

## 2021-11-07 DIAGNOSIS — E785 Hyperlipidemia, unspecified: Secondary | ICD-10-CM | POA: Diagnosis not present

## 2021-11-07 DIAGNOSIS — M4802 Spinal stenosis, cervical region: Secondary | ICD-10-CM | POA: Diagnosis not present

## 2021-11-07 DIAGNOSIS — Z9181 History of falling: Secondary | ICD-10-CM | POA: Diagnosis not present

## 2021-11-07 DIAGNOSIS — I69398 Other sequelae of cerebral infarction: Secondary | ICD-10-CM | POA: Diagnosis not present

## 2021-11-07 DIAGNOSIS — M8938 Hypertrophy of bone, other site: Secondary | ICD-10-CM | POA: Diagnosis not present

## 2021-11-07 DIAGNOSIS — Z7901 Long term (current) use of anticoagulants: Secondary | ICD-10-CM | POA: Diagnosis not present

## 2021-11-13 DIAGNOSIS — Z9181 History of falling: Secondary | ICD-10-CM | POA: Diagnosis not present

## 2021-11-13 DIAGNOSIS — I700 Atherosclerosis of aorta: Secondary | ICD-10-CM | POA: Diagnosis not present

## 2021-11-13 DIAGNOSIS — Z7901 Long term (current) use of anticoagulants: Secondary | ICD-10-CM | POA: Diagnosis not present

## 2021-11-13 DIAGNOSIS — E785 Hyperlipidemia, unspecified: Secondary | ICD-10-CM | POA: Diagnosis not present

## 2021-11-13 DIAGNOSIS — M4802 Spinal stenosis, cervical region: Secondary | ICD-10-CM | POA: Diagnosis not present

## 2021-11-13 DIAGNOSIS — I69354 Hemiplegia and hemiparesis following cerebral infarction affecting left non-dominant side: Secondary | ICD-10-CM | POA: Diagnosis not present

## 2021-11-13 DIAGNOSIS — H547 Unspecified visual loss: Secondary | ICD-10-CM | POA: Diagnosis not present

## 2021-11-13 DIAGNOSIS — I10 Essential (primary) hypertension: Secondary | ICD-10-CM | POA: Diagnosis not present

## 2021-11-13 DIAGNOSIS — Z86711 Personal history of pulmonary embolism: Secondary | ICD-10-CM | POA: Diagnosis not present

## 2021-11-13 DIAGNOSIS — Z87891 Personal history of nicotine dependence: Secondary | ICD-10-CM | POA: Diagnosis not present

## 2021-11-13 DIAGNOSIS — I69398 Other sequelae of cerebral infarction: Secondary | ICD-10-CM | POA: Diagnosis not present

## 2021-11-13 DIAGNOSIS — M8938 Hypertrophy of bone, other site: Secondary | ICD-10-CM | POA: Diagnosis not present

## 2021-11-13 DIAGNOSIS — R918 Other nonspecific abnormal finding of lung field: Secondary | ICD-10-CM | POA: Diagnosis not present

## 2021-11-14 ENCOUNTER — Other Ambulatory Visit: Payer: Medicare Other

## 2021-11-14 ENCOUNTER — Ambulatory Visit: Payer: Medicare Other | Admitting: *Deleted

## 2021-11-14 DIAGNOSIS — I10 Essential (primary) hypertension: Secondary | ICD-10-CM

## 2021-11-14 DIAGNOSIS — I700 Atherosclerosis of aorta: Secondary | ICD-10-CM

## 2021-11-14 DIAGNOSIS — I69398 Other sequelae of cerebral infarction: Secondary | ICD-10-CM

## 2021-11-14 DIAGNOSIS — R918 Other nonspecific abnormal finding of lung field: Secondary | ICD-10-CM

## 2021-11-14 DIAGNOSIS — I2699 Other pulmonary embolism without acute cor pulmonale: Secondary | ICD-10-CM

## 2021-11-14 DIAGNOSIS — G40901 Epilepsy, unspecified, not intractable, with status epilepticus: Secondary | ICD-10-CM

## 2021-11-14 DIAGNOSIS — E785 Hyperlipidemia, unspecified: Secondary | ICD-10-CM

## 2021-11-14 DIAGNOSIS — M8938 Hypertrophy of bone, other site: Secondary | ICD-10-CM

## 2021-11-14 DIAGNOSIS — H547 Unspecified visual loss: Secondary | ICD-10-CM

## 2021-11-14 DIAGNOSIS — M4802 Spinal stenosis, cervical region: Secondary | ICD-10-CM

## 2021-11-14 DIAGNOSIS — E7800 Pure hypercholesterolemia, unspecified: Secondary | ICD-10-CM

## 2021-11-14 DIAGNOSIS — I69354 Hemiplegia and hemiparesis following cerebral infarction affecting left non-dominant side: Secondary | ICD-10-CM

## 2021-11-14 NOTE — Chronic Care Management (AMB) (Signed)
Chronic Care Management    Clinical Social Work Note  11/14/2021 Name: Travis Salazar. MRN: [id number] DOB: 1936/03/06  Travis Salazar. is a 85 y.o. year old male who is a primary care patient of Pcp, No. The CCM team was consulted to assist the patient with chronic disease management and/or care coordination needs related to: [employer] .   Collaboration with patient's daughter and the Department of Human Services  for follow up visit in response to provider referral for social work chronic care management and care coordination services.   Consent to Services:  The patient was given information about Chronic Care Management services, agreed to services, and gave verbal consent prior to initiation of services.  Please see initial visit note for detailed documentation.   Patient agreed to services and consent obtained.   Assessment: Review of patient past medical history, allergies, medications, and health status, including review of relevant consultants reports was performed today as part of a comprehensive evaluation and provision of chronic care management and care coordination services.     SDOH (Social Determinants of Health) assessments and interventions performed:    Advanced Directives Status: Not addressed in this encounter.  CCM Care Plan  No Known Allergies  Outpatient Encounter Medications as of 11/14/2021  Medication Sig   allopurinol (ZYLOPRIM) 300 MG tablet Take 300 mg by mouth daily as needed.   amLODipine (NORVASC) 10 MG tablet TAKE 1 TABLET BY MOUTH EVERY DAY   apixaban (ELIQUIS) 5 MG TABS tablet Take 1 tablet (5 mg total) by mouth 2 (two) times daily.   atorvastatin (LIPITOR) 40 MG tablet TAKE 1 TABLET (40 MG TOTAL) BY MOUTH DAILY AT 6 PM.   levETIRAcetam (KEPPRA) 1000 MG tablet Take 1 tablet (1,000 mg total) by mouth 2 (two) times daily.   levETIRAcetam (KEPPRA) 500 MG tablet Take 3 tablets (1,500 mg total) by mouth 2 (two) times daily.   metoprolol  tartrate (LOPRESSOR) 25 MG tablet Take one-half tablet (12.5 mg total) by mouth 2 (two) times daily.   tamsulosin (FLOMAX) 0.4 MG CAPS capsule Take 0.4 mg by mouth daily.   No facility-administered encounter medications on file as of 11/14/2021.    Patient Active Problem List   Diagnosis Date Noted   Seizures ([place]) 10/07/2021   Seizure ([place]) 10/06/2021   Essential hypertension 10/06/2021   Abnormal CT of the head 10/06/2021   Laceration of left eyebrow 10/06/2021   Aortic stenosis 09/11/2021   Seizure disorder ([place]) 08/29/2021   DVT (deep venous thrombosis) ([place]) 08/25/2021   Acute urinary retention 08/18/2021   Status epilepticus ([place]) 08/13/2021   Acute pulmonary embolism without acute cor pulmonale ([place]) 05/27/2021   Demand ischemia ([place]) 05/27/2021   CAD (coronary artery disease) 05/27/2021   Emphysema lung ([place]) 05/27/2021   Acute left hemiparesis ([place]) 04/30/2020   Paroxysmal supraventricular tachycardia ([place]) 04/30/2020   Right lower lobe lung mass 01/07/2020   CVA (cerebral vascular accident) ([place]) 06/26/2019   Wrist swelling, right 06/03/2017   S/P prostatectomy 10/08/2016   Snores 10/08/2016   Essential (primary) hypertension 09/27/2015   History of tobacco use 09/27/2015   Hypercholesterolemia 09/27/2015   Blood glucose elevated 09/27/2015    Conditions to be addressed/monitored:  Essential (primary) hypertension     Hypercholesterolemia     Acute pulmonary embolism without acute cor pulmonale, unspecified pulmonary embolism type ([place])     ;Limited social support and ADL IADL limitations    Care Plan : General Social Work (Adult)  Updates made  by Vern Claude, LCSW since 11/14/2021 12:00 AM     Problem: CHL AMB "PATIENT-SPECIFIC PROBLEM"   Note:   CARE PLAN ENTRY (see longitudinal plan of care for additional care plan information)  Current Barriers:  Patient with HTN in need of assistance with connection to community resources  Knowledge deficits  and need for support, education and care coordination related to community resources support  ADL IADL limitations and Limited access to caregiver  Clinical Goal(s)  Over the next 90 days, patient will work with the Department of Human Services to follow up on referral for CAP services  Interventions provided by LCSW: Assessed patient's care coordination needs related to need for in home support due to ADL and IADL limitations-discussed ongoing care management follow up  Patient's daughter remains primary caregiver and actively assists with daily care and medical follow up Confirmed that [facility] PT has started-actively participates-mobility has improved-uses a cain Experiencing weakness in left hand-will follow up with MD and OT recommended Patient's daughter confirmed that CAPD-DA services have been approved-patient now on waiting list and will be contacted for a in home assessment once name comes up  [facility] contacted-spoke with Minus Liberty with the Department of Human Services-Cori Hodgin who agreed to check status and will follow back up with this Education officer, museum Caregiver stress continues to be acknowledged Positive reinforcement provided for motivation to provide care and close follow up with the CAP-DA program   Patient Self Care Activities & Deficits:  Patient is unable to independently navigate community resource options without care coordination support  Acknowledges deficits and is motivated to resolve concern  Unable to perform ADLs independently Unable to perform IADLs independently Patient's daughter available for support, however lives in [place], [place]  Please see past updates related to this goal by clicking on the "Past Updates" button in the selected goal         Follow Up Plan: SW will follow up with patient by phone over the next 14 business days       [place], [place] Worker  [facility] Care  Management [phone]

## 2021-11-14 NOTE — Patient Instructions (Signed)
Visit Information It was great speaking with you today!  Please let me know if you have any questions about our visit.   Goals Addressed             This Visit's Progress    Track and Manage My Blood Pressure-Hypertension       Timeframe:  Long-Range Goal Priority:  High Start Date:  11/14/21                           Expected End Date: 11/14/22                      Follow Up within 90 days   - check blood pressure weekly    Why is this important?   You won't feel high blood pressure, but it can still hurt your blood vessels.  High blood pressure can cause heart or kidney problems. It can also cause a stroke.  Making lifestyle changes like losing a little weight or eating less salt will help.  Checking your blood pressure at home and at different times of the day can help to control blood pressure.  If the doctor prescribes medicine remember to take it the way the doctor ordered.  Call the office if you cannot afford the medicine or if there are questions about it.     Notes:        Patient Care Plan: General Pharmacy (Adult)     Problem Identified: Hypertension, Hyperlipidemia, and History of PE   Priority: High     Long-Range Goal: Patient-Specific Goal   Start Date: 11/14/2021  Expected End Date: 11/14/2022  This Visit's Progress: On track  Priority: High  Note:   Current Barriers:  Unable to independently monitor therapeutic efficacy  Pharmacist Clinical Goal(s):  Patient will verbalize ability to afford treatment regimen through collaboration with PharmD and provider.   Interventions: 1:1 collaboration with Pcp, No regarding development and update of comprehensive plan of care as evidenced by provider attestation and co-signature Inter-disciplinary care team collaboration (see longitudinal plan of care) Comprehensive medication review performed; medication list updated in electronic medical record  Hypertension (BP goal <140/90) -Controlled -Current  treatment: Amlodipine 10 mg daily  Metoprolol tartrate 25 1/2 tablet twice daily -Medications previously tried: NA  -Denies hypotensive/hypertensive symptoms -Recommended to continue current medication  Hyperlipidemia: (LDL goal < 70) -Controlled -Current treatment: Atorvastatin 40 mg daily  -Medications previously tried: NA  -Recommended to continue current medication  History of PE (Goal: Prevent stroke/embolism) -Not ideally controlled -Current treatment  Eliquis 5 mg twice daily  -Medications previously tried: NA  -Cost concerns with affording Eliquis -Recommended to continue current medication -Start PAP for Eliquis. Patient brought in assistance paperwork, but still needs to bring in pharmacy spending report before it can be submitted and approved. Patient daughter stated she would do so and fax it into the office.   Patient Goals/Self-Care Activities Patient will:  - check blood pressure weekly, document, and provide at future appointments  Follow Up Plan: Telephone follow up appointment with care management team member scheduled for:  06/17/2022 at 3:00 PM       Mr. Kloss was given information about Chronic Care Management services today including:  CCM service includes personalized support from designated clinical staff supervised by his physician, including individualized plan of care and coordination with other care providers 24/7 contact phone numbers for assistance for urgent and routine care needs. Standard insurance, coinsurance,  copays and deductibles apply for chronic care management only during months in which we provide at least 20 minutes of these services. Most insurances cover these services at 100%, however patients may be responsible for any copay, coinsurance and/or deductible if applicable. This service may help you avoid the need for more expensive face-to-face services. Only one practitioner may furnish and bill the service in a calendar month. The  patient may stop CCM services at any time (effective at the end of the month) by phone call to the office staff.  Patient agreed to services and verbal consent obtained.   Patient verbalizes understanding of instructions provided today and agrees to view in [place].   Junius Argyle, PharmD, Para March, CPP  Clinical Pharmacist Practitioner  [facility] [phone]

## 2021-11-14 NOTE — Patient Instructions (Signed)
Visit Information  Thank you for taking time to visit with me today. Please don't hesitate to contact me if I can be of assistance to you before our next scheduled telephone appointment.  Following are the goals we discussed today:   - begin a notebook of services in my neighborhood or community - call 211 when I need some help - follow-up on any referrals for help I am given - think ahead to make sure my need does not become an emergency - have a back-up plan  -follow up on referral for [facility] for in home support  Our next appointment is by telephone on 11/28/21 at 10:30  Please call the care guide team at [phone] if you need to cancel or reschedule your appointment.   If you are experiencing a Mental Health or [place] or need someone to talk to, please call the Suicide and Crisis Lifeline: 988   Following is a copy of your full plan of care:  Care Plan : General Social Work (Adult)  Updates made by [employer], Darla Lesches, LCSW since 11/14/2021 12:00 AM     Problem: CHL AMB "PATIENT-SPECIFIC PROBLEM"   Note:   CARE PLAN ENTRY (see longitudinal plan of care for additional care plan information)  Current Barriers:  Patient with HTN in need of assistance with connection to community resources  Knowledge deficits and need for support, education and care coordination related to community resources support  ADL IADL limitations and Limited access to caregiver  Clinical Goal(s)  Over the next 90 days, patient will work with the Department of Human Services to follow up on referral for CAP services  Interventions provided by LCSW: Assessed patient's care coordination needs related to need for in home support due to ADL and IADL limitations-discussed ongoing care management follow up  Patient's daughter remains primary caregiver and actively assists with daily care and medical follow up Confirmed that [facility] PT has started-actively  participates-mobility has improved-uses a cain Experiencing weakness in left hand-will follow up with MD and OT recommended Patient's daughter confirmed that CAPD-DA services have been approved-patient now on waiting list and will be contacted for a in home assessment once name comes up  [facility] contacted-spoke with Minus Liberty with the Department of Human Services-Cori Hodgin who agreed to check status and will follow back up with this Education officer, museum Caregiver stress continues to be acknowledged Positive reinforcement provided for motivation to provide care and close follow up with the CAP-DA program   Patient Self Care Activities & Deficits:  Patient is unable to independently navigate community resource options without care coordination support  Acknowledges deficits and is motivated to resolve concern  Unable to perform ADLs independently Unable to perform IADLs independently Patient's daughter available for support, however lives in [place], [place]  Please see past updates related to this goal by clicking on the "Past Updates" button in the selected goal        Mr. Kelsay was given information about Care Management services by the embedded care coordination team including:  Care Management services include personalized support from designated clinical staff supervised by his physician, including individualized plan of care and coordination with other care providers 24/7 contact phone numbers for assistance for urgent and routine care needs. The patient may stop CCM services at any time (effective at the end of the month) by phone call to the office staff.  Patient agreed to services and verbal consent obtained.  The patient verbalized understanding of instructions, educational materials, and care plan provided today and declined offer to receive copy of patient instructions, educational materials, and care plan.   Telephone follow up appointment  with care management team member scheduled for: 11/28/21   Elliot Gurney, [place] Worker  [facility] [phone]

## 2021-11-15 DIAGNOSIS — E785 Hyperlipidemia, unspecified: Secondary | ICD-10-CM | POA: Diagnosis not present

## 2021-11-15 DIAGNOSIS — H547 Unspecified visual loss: Secondary | ICD-10-CM | POA: Diagnosis not present

## 2021-11-15 DIAGNOSIS — I69398 Other sequelae of cerebral infarction: Secondary | ICD-10-CM | POA: Diagnosis not present

## 2021-11-15 DIAGNOSIS — I10 Essential (primary) hypertension: Secondary | ICD-10-CM | POA: Diagnosis not present

## 2021-11-15 DIAGNOSIS — I69354 Hemiplegia and hemiparesis following cerebral infarction affecting left non-dominant side: Secondary | ICD-10-CM | POA: Diagnosis not present

## 2021-11-15 DIAGNOSIS — Z87891 Personal history of nicotine dependence: Secondary | ICD-10-CM | POA: Diagnosis not present

## 2021-11-15 DIAGNOSIS — I700 Atherosclerosis of aorta: Secondary | ICD-10-CM | POA: Diagnosis not present

## 2021-11-15 DIAGNOSIS — Z9181 History of falling: Secondary | ICD-10-CM | POA: Diagnosis not present

## 2021-11-15 DIAGNOSIS — Z7901 Long term (current) use of anticoagulants: Secondary | ICD-10-CM | POA: Diagnosis not present

## 2021-11-15 DIAGNOSIS — M8938 Hypertrophy of bone, other site: Secondary | ICD-10-CM | POA: Diagnosis not present

## 2021-11-15 DIAGNOSIS — R918 Other nonspecific abnormal finding of lung field: Secondary | ICD-10-CM | POA: Diagnosis not present

## 2021-11-15 DIAGNOSIS — Z86711 Personal history of pulmonary embolism: Secondary | ICD-10-CM | POA: Diagnosis not present

## 2021-11-15 DIAGNOSIS — M4802 Spinal stenosis, cervical region: Secondary | ICD-10-CM | POA: Diagnosis not present

## 2021-11-16 ENCOUNTER — Telehealth: Payer: Self-pay

## 2021-11-16 NOTE — Telephone Encounter (Signed)
Patient of Dr. Marlan Palau, please review.KW

## 2021-11-16 NOTE — Telephone Encounter (Signed)
Called back left message 

## 2021-11-16 NOTE — Telephone Encounter (Signed)
Copied from [place] [id number]. Topic: Quick Communication - Home Health Verbal Orders >> Nov 16, 2021  1:23 PM Pawlus, Brayton Layman A wrote: Caller/Agency: [facility]  Callback Number: [phone] Requesting: OT Frequency: 1x1, 2x2

## 2021-11-18 ENCOUNTER — Other Ambulatory Visit: Payer: Self-pay | Admitting: Family Medicine

## 2021-11-18 NOTE — Telephone Encounter (Signed)
Requested medication (s) are due for refill today: yes  Requested medication (s) are on the active medication list: yes  Last refill:  11/11/20 #90 3 RF  Future visit scheduled: yes  Notes to clinic:  overdue lab work   Requested Prescriptions  Pending Prescriptions Disp Refills   atorvastatin (LIPITOR) 40 MG tablet [Pharmacy Med Name: ATORVASTATIN 40 MG TABLET] 90 tablet 3    Sig: TAKE 1 TABLET (40 MG TOTAL) BY MOUTH DAILY AT 6 PM.     Cardiovascular:  Antilipid - Statins Failed - 11/18/2021  9:24 AM      Failed - Total Cholesterol in normal range and within 360 days    Cholesterol, Total  Date Value Ref Range Status  08/29/2020 187 100 - 199 mg/dL Final          Failed - LDL in normal range and within 360 days    LDL Cholesterol (Calc)  Date Value Ref Range Status  10/06/2017 119 (H) mg/dL (calc) Final    Comment:    Reference range: <100 . Desirable range <100 mg/dL for primary prevention;   <70 mg/dL for patients with CHD or diabetic patients  with > or = 2 CHD risk factors. Marland Kitchen LDL-C is now calculated using the Martin-Hopkins  calculation, which is a validated novel method providing  better accuracy than the Friedewald equation in the  estimation of LDL-C.  Cresenciano Genre et al. Annamaria Helling. 1194;174(08): 2061-2068  (http://education.[employer].com/faq/FAQ164)    LDL Chol Calc (NIH)  Date Value Ref Range Status  08/29/2020 125 (H) 0 - 99 mg/dL Final          Failed - HDL in normal range and within 360 days    HDL  Date Value Ref Range Status  08/29/2020 40 >39 mg/dL Final          Passed - Triglycerides in normal range and within 360 days    Triglycerides  Date Value Ref Range Status  08/14/2021 89 <150 mg/dL Final    Comment:    Performed at [facility], [place] [address] [place], [place] [address]          Passed - Patient is not pregnant      Passed - Valid encounter within last 12 months    Recent Outpatient Visits           2 months ago  Acute deep vein thrombosis (DVT) of left lower extremity, unspecified vein [facility])   [facility] [name], MD   5 months ago Acute pulmonary embolism without acute cor pulmonale, unspecified pulmonary embolism type [facility])   [facility] [name], MD   9 months ago Essential (primary) hypertension   [facility] [name], MD   11 months ago Need for COVID-19 vaccine   [facility] [name], MD   1 year ago Essential (primary) hypertension   [facility] [name], MD       Future Appointments             In 1 month Byrum, Rose Fillers, MD [facility]   In 2 months [name], MD [facility] [place]

## 2021-11-19 DIAGNOSIS — Z87891 Personal history of nicotine dependence: Secondary | ICD-10-CM | POA: Diagnosis not present

## 2021-11-19 DIAGNOSIS — E785 Hyperlipidemia, unspecified: Secondary | ICD-10-CM | POA: Diagnosis not present

## 2021-11-19 DIAGNOSIS — Z7901 Long term (current) use of anticoagulants: Secondary | ICD-10-CM | POA: Diagnosis not present

## 2021-11-19 DIAGNOSIS — Z9181 History of falling: Secondary | ICD-10-CM | POA: Diagnosis not present

## 2021-11-19 DIAGNOSIS — I69354 Hemiplegia and hemiparesis following cerebral infarction affecting left non-dominant side: Secondary | ICD-10-CM | POA: Diagnosis not present

## 2021-11-19 DIAGNOSIS — I700 Atherosclerosis of aorta: Secondary | ICD-10-CM | POA: Diagnosis not present

## 2021-11-19 DIAGNOSIS — R918 Other nonspecific abnormal finding of lung field: Secondary | ICD-10-CM | POA: Diagnosis not present

## 2021-11-19 DIAGNOSIS — Z86711 Personal history of pulmonary embolism: Secondary | ICD-10-CM | POA: Diagnosis not present

## 2021-11-19 DIAGNOSIS — M4802 Spinal stenosis, cervical region: Secondary | ICD-10-CM | POA: Diagnosis not present

## 2021-11-19 DIAGNOSIS — I69398 Other sequelae of cerebral infarction: Secondary | ICD-10-CM | POA: Diagnosis not present

## 2021-11-19 DIAGNOSIS — H547 Unspecified visual loss: Secondary | ICD-10-CM | POA: Diagnosis not present

## 2021-11-19 DIAGNOSIS — I10 Essential (primary) hypertension: Secondary | ICD-10-CM | POA: Diagnosis not present

## 2021-11-19 DIAGNOSIS — M8938 Hypertrophy of bone, other site: Secondary | ICD-10-CM | POA: Diagnosis not present

## 2021-11-20 DIAGNOSIS — I700 Atherosclerosis of aorta: Secondary | ICD-10-CM | POA: Diagnosis not present

## 2021-11-20 DIAGNOSIS — I69354 Hemiplegia and hemiparesis following cerebral infarction affecting left non-dominant side: Secondary | ICD-10-CM | POA: Diagnosis not present

## 2021-11-20 DIAGNOSIS — H547 Unspecified visual loss: Secondary | ICD-10-CM | POA: Diagnosis not present

## 2021-11-20 DIAGNOSIS — Z9181 History of falling: Secondary | ICD-10-CM | POA: Diagnosis not present

## 2021-11-20 DIAGNOSIS — I69398 Other sequelae of cerebral infarction: Secondary | ICD-10-CM | POA: Diagnosis not present

## 2021-11-20 DIAGNOSIS — Z86711 Personal history of pulmonary embolism: Secondary | ICD-10-CM | POA: Diagnosis not present

## 2021-11-20 DIAGNOSIS — M8938 Hypertrophy of bone, other site: Secondary | ICD-10-CM | POA: Diagnosis not present

## 2021-11-20 DIAGNOSIS — R918 Other nonspecific abnormal finding of lung field: Secondary | ICD-10-CM | POA: Diagnosis not present

## 2021-11-20 DIAGNOSIS — Z87891 Personal history of nicotine dependence: Secondary | ICD-10-CM | POA: Diagnosis not present

## 2021-11-20 DIAGNOSIS — M4802 Spinal stenosis, cervical region: Secondary | ICD-10-CM | POA: Diagnosis not present

## 2021-11-20 DIAGNOSIS — I10 Essential (primary) hypertension: Secondary | ICD-10-CM | POA: Diagnosis not present

## 2021-11-20 DIAGNOSIS — Z7901 Long term (current) use of anticoagulants: Secondary | ICD-10-CM | POA: Diagnosis not present

## 2021-11-20 DIAGNOSIS — E785 Hyperlipidemia, unspecified: Secondary | ICD-10-CM | POA: Diagnosis not present

## 2021-11-21 NOTE — Telephone Encounter (Signed)
Advised as below.  

## 2021-11-22 DIAGNOSIS — M8938 Hypertrophy of bone, other site: Secondary | ICD-10-CM | POA: Diagnosis not present

## 2021-11-22 DIAGNOSIS — Z7901 Long term (current) use of anticoagulants: Secondary | ICD-10-CM | POA: Diagnosis not present

## 2021-11-22 DIAGNOSIS — Z9181 History of falling: Secondary | ICD-10-CM | POA: Diagnosis not present

## 2021-11-22 DIAGNOSIS — M4802 Spinal stenosis, cervical region: Secondary | ICD-10-CM | POA: Diagnosis not present

## 2021-11-22 DIAGNOSIS — R918 Other nonspecific abnormal finding of lung field: Secondary | ICD-10-CM | POA: Diagnosis not present

## 2021-11-22 DIAGNOSIS — I700 Atherosclerosis of aorta: Secondary | ICD-10-CM | POA: Diagnosis not present

## 2021-11-22 DIAGNOSIS — E785 Hyperlipidemia, unspecified: Secondary | ICD-10-CM | POA: Diagnosis not present

## 2021-11-22 DIAGNOSIS — I69398 Other sequelae of cerebral infarction: Secondary | ICD-10-CM | POA: Diagnosis not present

## 2021-11-22 DIAGNOSIS — Z87891 Personal history of nicotine dependence: Secondary | ICD-10-CM | POA: Diagnosis not present

## 2021-11-22 DIAGNOSIS — I69354 Hemiplegia and hemiparesis following cerebral infarction affecting left non-dominant side: Secondary | ICD-10-CM | POA: Diagnosis not present

## 2021-11-22 DIAGNOSIS — H547 Unspecified visual loss: Secondary | ICD-10-CM | POA: Diagnosis not present

## 2021-11-22 DIAGNOSIS — I10 Essential (primary) hypertension: Secondary | ICD-10-CM | POA: Diagnosis not present

## 2021-11-22 DIAGNOSIS — Z86711 Personal history of pulmonary embolism: Secondary | ICD-10-CM | POA: Diagnosis not present

## 2021-11-23 ENCOUNTER — Ambulatory Visit (INDEPENDENT_AMBULATORY_CARE_PROVIDER_SITE_OTHER)
Admission: RE | Admit: 2021-11-23 | Discharge: 2021-11-23 | Disposition: A | Discharge status: Home / Self Care | Payer: Medicare Other | Source: Ambulatory Visit | Attending: Physician Assistant | Admitting: Physician Assistant

## 2021-11-23 ENCOUNTER — Ambulatory Visit (HOSPITAL_COMMUNITY)
Admission: RE | Admit: 2021-11-23 | Discharge: 2021-11-23 | Disposition: A | Discharge status: Home / Self Care | Payer: Medicare Other | Source: Ambulatory Visit | Attending: Physician Assistant | Admitting: Physician Assistant

## 2021-11-23 ENCOUNTER — Ambulatory Visit: Payer: Medicare Other | Admitting: Physician Assistant

## 2021-11-23 ENCOUNTER — Other Ambulatory Visit: Payer: Self-pay

## 2021-11-23 VITALS — BP 120/70 | HR 69 | Temp 98.1°F | Resp 20 | Ht 68.0 in | Wt 188.9 lb

## 2021-11-23 DIAGNOSIS — I82422 Acute embolism and thrombosis of left iliac vein: Secondary | ICD-10-CM

## 2021-11-23 DIAGNOSIS — I739 Peripheral vascular disease, unspecified: Secondary | ICD-10-CM | POA: Diagnosis not present

## 2021-11-23 NOTE — Progress Notes (Signed)
Office Note     CC:  follow up Requesting Provider:  [name],*  HPI: Travis Salazar. is a 85 y.o. (Apr 29, 1936) male who presents for follow up of LLE iliofemoral DVT. This was diagnosed back in September when he had presented to the ED with 2 week history of increased LLE swelling and pain. He was placed on Eliquis for management. He was last seen in October by Dr. Virl Cagey at which time his symptoms had greatly improved. He was continued on Eliquis with recommendation to continue lifelong due to recurrence of DVT. He additionally was fitted for compression stockings at this visit.  He returns today for follow up with his daughter. He returns with non invasive studies for both his DVT as well as ABI to assess arterial occlusive disease. At his last visit it was felt that he had weak pulses in his LLE. He explains today that his symptoms are completely resolved as far as pain. He does not have any pain on ambulation or rest. He has no non healing wounds. He does have swelling still in the left> right leg. He elevates intermittently and he has been wearing his knee high compression stockings intermittently as well. He lives independently so says they are difficult to don and doff daily. He is compliant with his Eliquis.  The pt is is on a statin for cholesterol management.  The pt is none on a daily aspirin.   Other AC:  Eliquis The pt is on medications for hypertension.   The pt is not diabetic.  Tobacco hx:  former  Past Medical History:  Diagnosis Date   Hyperlipidemia    Hypertension    Seizures ([place])    Stroke [facility])     Past Surgical History:  Procedure Laterality Date   LIPOMA EXCISION     neck   PROSTATECTOMY     due to prostate cancer    Social History   Socioeconomic History   Marital status: Married    Spouse name: [name]   Number of children: 4   Years of education: 10th grade   Highest education level: 9th grade  Occupational History   Occupation:  retired  Tobacco Use   Smoking status: Former    Packs/day: 1.00    Years: 50.00    Pack years: 50.00    Types: Cigarettes    Quit date: 12/15/2009    Years since quitting: 11.9   Smokeless tobacco: Never  Vaping Use   Vaping Use: Never used  Substance and Sexual Activity   Alcohol use: No    Alcohol/week: 0.0 standard drinks   Drug use: No   Sexual activity: Not on file  Other Topics Concern   Not on file  Social History Narrative   ** Merged History Encounter **       Right handed    Social Determinants of Health   Financial Resource Strain: High Risk   Difficulty of Paying Living Expenses: Hard  Food Insecurity: No Food Insecurity   Worried About Running Out of Food in the Last Year: Never true   Ran Out of Food in the Last Year: Never true  Transportation Needs: No Transportation Needs   Lack of Transportation (Medical): No   Lack of Transportation (Non-Medical): No  Physical Activity: Insufficiently Active   Days of Exercise per Week: 7 days   Minutes of Exercise per Session: 20 min  Stress: No Stress Concern Present   Feeling of Stress : Not at all  Social Connections: Moderately Integrated   Frequency of Communication with Friends and Family: More than three times a week   Frequency of Social Gatherings with Friends and Family: More than three times a week   Attends Religious Services: More than 4 times per year   Active Member of [employer] or Organizations: No   Attends Archivist Meetings: Never   Marital Status: Married  Human resources officer Violence: Not on file    Family History  Problem Relation Age of Onset   COPD Mother    Heart attack Father     Current Outpatient Medications  Medication Sig Dispense Refill   allopurinol (ZYLOPRIM) 300 MG tablet Take 300 mg by mouth daily as needed.     amLODipine (NORVASC) 10 MG tablet TAKE 1 TABLET BY MOUTH EVERY DAY 90 tablet 1   apixaban (ELIQUIS) 5 MG TABS tablet Take 1 tablet (5 mg total) by mouth 2  (two) times daily. 60 tablet 3   atorvastatin (LIPITOR) 40 MG tablet TAKE 1 TABLET (40 MG TOTAL) BY MOUTH DAILY AT 6 PM. 90 tablet 0   tamsulosin (FLOMAX) 0.4 MG CAPS capsule Take 0.4 mg by mouth daily.     levETIRAcetam (KEPPRA) 1000 MG tablet Take 1 tablet (1,000 mg total) by mouth 2 (two) times daily. 180 tablet 3   levETIRAcetam (KEPPRA) 500 MG tablet Take 3 tablets (1,500 mg total) by mouth 2 (two) times daily. 180 tablet 0   metoprolol tartrate (LOPRESSOR) 25 MG tablet Take one-half tablet (12.5 mg total) by mouth 2 (two) times daily. 180 tablet 1   No current facility-administered medications for this visit.    No Known Allergies   REVIEW OF SYSTEMS:  [X]  denotes positive finding, [ ]  denotes negative finding Cardiac  Comments:  Chest pain or chest pressure:    Shortness of breath upon exertion:    Short of breath when lying flat:    Irregular heart rhythm:        Vascular    Pain in calf, thigh, or hip brought on by ambulation:    Pain in feet at night that wakes you up from your sleep:     Blood clot in your veins:    Leg swelling:  X       Pulmonary    Oxygen at home:    Productive cough:     Wheezing:         Neurologic    Sudden weakness in arms or legs:     Sudden numbness in arms or legs:     Sudden onset of difficulty speaking or slurred speech:    Temporary loss of vision in one eye:     Problems with dizziness:         Gastrointestinal    Blood in stool:     Vomited blood:         Genitourinary    Burning when urinating:     Blood in urine:        Psychiatric    Major depression:         Hematologic    Bleeding problems:    Problems with blood clotting too easily:        Skin    Rashes or ulcers:        Constitutional    Fever or chills:      PHYSICAL EXAMINATION:  Vitals:   11/23/21 1158  BP: 120/70  Pulse: 69  Resp: 20  Temp: 98.1 F (36.7 C)  TempSrc: Temporal  SpO2: 96%  Weight: 188 lb 14.4 oz (85.7 kg)  Height: 5\' 8"   (1.727 m)    General:  WDWN in NAD; vital signs documented above Gait: Normal HENT: WNL, normocephalic Pulmonary: normal non-labored breathing , without wheezing Cardiac: regular HR, with systolic  Murmur, no carotid bruit Abdomen: soft, NT, no masses Vascular Exam/Pulses:  Right Left  Radial 2+ (normal) 2+ (normal)  Femoral 2+ (normal) 2+ (normal)  Popliteal 2+ (normal) 2+ (normal)  DP 2+ (normal) absent  PT 1+ (weak) 2+ (normal)   Extremities: without ischemic changes, without Gangrene , without cellulitis;  open wounds;  Musculoskeletal: no muscle wasting or atrophy  Neurologic: A&O X 3;  No focal weakness or paresthesias are detected Psychiatric:  The pt has Normal affect.   Non-Invasive Vascular Imaging:   11/23/21 +-------+-----------+-----------+------------+------------+  ABI/TBIToday's ABIToday's TBIPrevious ABIPrevious TBI  +-------+-----------+-----------+------------+------------+  Right  1.09       0.90                                 +-------+-----------+-----------+------------+------------+  Left   1.00       0.72                                 +-------+-----------+-----------+------------+------------+   VAS [place] LLE DVT: +---------+---------------+---------+-----------+---------------+----------  ----+  LEFT     CompressibilityPhasicitySpontaneityProperties     Thrombus  Aging  +---------+---------------+---------+-----------+---------------+----------  ----+  CFV      None                               partially      Continued                                                    re-cannalized                    +---------+---------------+---------+-----------+---------------+----------  ----+  SFJ      None                                              Continued        +---------+---------------+---------+-----------+---------------+----------  ----+  FV Prox  None                                               Continued        +---------+---------------+---------+-----------+---------------+----------  ----+  FV Mid   None                                              Continued        +---------+---------------+---------+-----------+---------------+----------  ----+  FV DistalNone  Continued        +---------+---------------+---------+-----------+---------------+----------  ----+  POP      None                                              Continued        +---------+---------------+---------+-----------+---------------+----------  ----+  PTV      None                                              Continued        +---------+---------------+---------+-----------+---------------+----------  ----+   EIV thrombus    Summary:  RIGHT:  - No evidence of common femoral vein obstruction.     LEFT:  - Findings consistent with age indeterminate deep vein thrombosis involving the left femoral vein, left popliteal vein, and left posterior tibial veins.  - There is partially-recanalized thrombus in the left external iliac vein and common femoral vein.   ASSESSMENT/PLAN:: 85 y.o. male here for follow up for LLE iliofemoral DVT. This was diagnosed back in September when he had presented to the ED with 2 week history of increased LLE swelling and pain. His left leg remains improved with no pain. He does still have swelling present. He intermittently elevates and wears knee high compression. I have encouraged him to be more consistent with this if possible. - His ABI's today are essentially normal with biphasic/ triphasic flow bilaterally - Venous DVT duplex shows DVT throughout left femoral, popliteal, PT veins. Some recanalization of the Left EIV and CFV noted - he will remain on lifelong Eliquis - He can follow up as needed if he has any new or worsening symptoms   Karoline Caldwell, PA-C [facility] [phone]  Clinic MD:   Virl Cagey

## 2021-11-26 DIAGNOSIS — R918 Other nonspecific abnormal finding of lung field: Secondary | ICD-10-CM | POA: Diagnosis not present

## 2021-11-26 DIAGNOSIS — H547 Unspecified visual loss: Secondary | ICD-10-CM | POA: Diagnosis not present

## 2021-11-26 DIAGNOSIS — I69354 Hemiplegia and hemiparesis following cerebral infarction affecting left non-dominant side: Secondary | ICD-10-CM | POA: Diagnosis not present

## 2021-11-26 DIAGNOSIS — E785 Hyperlipidemia, unspecified: Secondary | ICD-10-CM | POA: Diagnosis not present

## 2021-11-26 DIAGNOSIS — Z7901 Long term (current) use of anticoagulants: Secondary | ICD-10-CM | POA: Diagnosis not present

## 2021-11-26 DIAGNOSIS — I700 Atherosclerosis of aorta: Secondary | ICD-10-CM | POA: Diagnosis not present

## 2021-11-26 DIAGNOSIS — M4802 Spinal stenosis, cervical region: Secondary | ICD-10-CM | POA: Diagnosis not present

## 2021-11-26 DIAGNOSIS — I10 Essential (primary) hypertension: Secondary | ICD-10-CM | POA: Diagnosis not present

## 2021-11-26 DIAGNOSIS — Z9181 History of falling: Secondary | ICD-10-CM | POA: Diagnosis not present

## 2021-11-26 DIAGNOSIS — M8938 Hypertrophy of bone, other site: Secondary | ICD-10-CM | POA: Diagnosis not present

## 2021-11-26 DIAGNOSIS — Z86711 Personal history of pulmonary embolism: Secondary | ICD-10-CM | POA: Diagnosis not present

## 2021-11-26 DIAGNOSIS — Z87891 Personal history of nicotine dependence: Secondary | ICD-10-CM | POA: Diagnosis not present

## 2021-11-26 DIAGNOSIS — I69398 Other sequelae of cerebral infarction: Secondary | ICD-10-CM | POA: Diagnosis not present

## 2021-11-27 ENCOUNTER — Telehealth: Payer: Self-pay

## 2021-11-27 NOTE — Telephone Encounter (Signed)
Copied from [place] [phone]. Topic: General - Inquiry >> Nov 27, 2021 10:28 AM Oneta Rack wrote: Caller name: Robin  Relation to pt: OTA from [facility]  Call back number: [phone]    Reason for call:  Informing PCP OTA had to cancel appointment due to her not feeling well. Patient is doing great and in compliance

## 2021-11-28 ENCOUNTER — Ambulatory Visit (INDEPENDENT_AMBULATORY_CARE_PROVIDER_SITE_OTHER): Payer: Medicare Other | Admitting: *Deleted

## 2021-11-28 ENCOUNTER — Telehealth: Payer: Self-pay | Admitting: Family Medicine

## 2021-11-28 ENCOUNTER — Ambulatory Visit (INDEPENDENT_AMBULATORY_CARE_PROVIDER_SITE_OTHER)
Admission: RE | Admit: 2021-11-28 | Discharge: 2021-11-28 | Disposition: A | Discharge status: Home / Self Care | Payer: Medicare Other | Source: Ambulatory Visit | Attending: Emergency Medicine | Admitting: Emergency Medicine

## 2021-11-28 ENCOUNTER — Other Ambulatory Visit: Payer: Self-pay

## 2021-11-28 DIAGNOSIS — E7800 Pure hypercholesterolemia, unspecified: Secondary | ICD-10-CM

## 2021-11-28 DIAGNOSIS — J439 Emphysema, unspecified: Secondary | ICD-10-CM | POA: Diagnosis not present

## 2021-11-28 DIAGNOSIS — R918 Other nonspecific abnormal finding of lung field: Secondary | ICD-10-CM | POA: Diagnosis not present

## 2021-11-28 DIAGNOSIS — I10 Essential (primary) hypertension: Secondary | ICD-10-CM

## 2021-11-28 DIAGNOSIS — I700 Atherosclerosis of aorta: Secondary | ICD-10-CM | POA: Diagnosis not present

## 2021-11-28 NOTE — Telephone Encounter (Signed)
Home Health Verbal Orders - Caller/Agency: Amy from [facility] Number: [phone] Requesting OT/PT/Skilled Nursing/Social Work/Speech Therapy: PT Strengthening, fall prevention, activity, balance, home exercise program  Frequency: Start next week 1w2

## 2021-11-28 NOTE — Patient Instructions (Addendum)
Visit Information  Thank you for taking time to visit with me today. Please don't hesitate to contact me if I can be of assistance to you before our next scheduled telephone appointment.  Following are the goals we discussed today:   - begin a notebook of services in my neighborhood or community - call 211 when I need some help - follow-up on any referrals for help I am given - think ahead to make sure my need does not become an emergency - have a back-up plan  -follow up on referral for Community Alternative Program for in home support    Our next appointment is by telephone on 12/20/21 at 10am  Please call the care guide team at [phone] if you need to cancel or reschedule your appointment.   If you are experiencing a Mental Health or [place] or need someone to talk to, please call the Suicide and Crisis Lifeline: 988   The patient verbalized understanding of instructions, educational materials, and care plan provided today and declined offer to receive copy of patient instructions, educational materials, and care plan.   Telephone follow up appointment with care management team member scheduled for: 12/20/21   Elliot Gurney, [place] Worker  [facility] [phone]

## 2021-11-28 NOTE — Chronic Care Management (AMB) (Signed)
Chronic Care Management    Clinical Social Work Note  11/28/2021 Name: Travis Salazar. MRN: [id number] DOB: [birth year]  Travis Lora. is a 85 y.o. year old male who is a primary care patient of [name], MD. The CCM team was consulted to assist the patient with chronic disease management and/or care coordination needs related to: [employer] .   Collaboration with patient's spouse  for follow up visit in response to provider referral for social work chronic care management and care coordination services.   Consent to Services:  The patient was given information about Chronic Care Management services, agreed to services, and gave verbal consent prior to initiation of services.  Please see initial visit note for detailed documentation.   Patient agreed to services and consent obtained.   Assessment: Review of patient past medical history, allergies, medications, and health status, including review of relevant consultants reports was performed today as part of a comprehensive evaluation and provision of chronic care management and care coordination services.     SDOH (Social Determinants of Health) assessments and interventions performed:    Advanced Directives Status: Not addressed in this encounter.  CCM Care Plan  No Known Allergies  Outpatient Encounter Medications as of 11/28/2021  Medication Sig   allopurinol (ZYLOPRIM) 300 MG tablet Take 300 mg by mouth daily as needed.   amLODipine (NORVASC) 10 MG tablet TAKE 1 TABLET BY MOUTH EVERY DAY   apixaban (ELIQUIS) 5 MG TABS tablet Take 1 tablet (5 mg total) by mouth 2 (two) times daily.   atorvastatin (LIPITOR) 40 MG tablet TAKE 1 TABLET (40 MG TOTAL) BY MOUTH DAILY AT 6 PM.   levETIRAcetam (KEPPRA) 1000 MG tablet Take 1 tablet (1,000 mg total) by mouth 2 (two) times daily.   levETIRAcetam (KEPPRA) 500 MG tablet Take 3 tablets (1,500 mg total) by mouth 2 (two) times daily.   metoprolol tartrate (LOPRESSOR) 25  MG tablet Take one-half tablet (12.5 mg total) by mouth 2 (two) times daily.   tamsulosin (FLOMAX) 0.4 MG CAPS capsule Take 0.4 mg by mouth daily.   No facility-administered encounter medications on file as of 11/28/2021.    Patient Active Problem List   Diagnosis Date Noted   Seizures ([place]) 10/07/2021   Seizure ([place]) 10/06/2021   Essential hypertension 10/06/2021   Abnormal CT of the head 10/06/2021   Laceration of left eyebrow 10/06/2021   Aortic stenosis 09/11/2021   Seizure disorder ([place]) 08/29/2021   DVT (deep venous thrombosis) ([place]) 08/25/2021   Acute urinary retention 08/18/2021   Status epilepticus ([place]) 08/13/2021   Acute pulmonary embolism without acute cor pulmonale ([place]) 05/27/2021   Demand ischemia ([place]) 05/27/2021   CAD (coronary artery disease) 05/27/2021   Emphysema lung ([place]) 05/27/2021   Acute left hemiparesis ([place]) 04/30/2020   Paroxysmal supraventricular tachycardia ([place]) 04/30/2020   Right lower lobe lung mass 01/07/2020   CVA (cerebral vascular accident) ([place]) 06/26/2019   Wrist swelling, right 06/03/2017   S/P prostatectomy 10/08/2016   Snores 10/08/2016   Essential (primary) hypertension 09/27/2015   History of tobacco use 09/27/2015   Hypercholesterolemia 09/27/2015   Blood glucose elevated 09/27/2015    Conditions to be addressed/monitored:  Essential (primary) hypertension     Hypercholesterolemia     Acute pulmonary embolism without acute cor pulmonale, unspecified pulmonary embolism type ([place])     ;Limited social support and ADL IADL limitations    Care Plan : General Social Work (Adult)  Updates made by [employer]  M, LCSW since 11/28/2021 12:00 AM     Problem: CHL AMB "PATIENT-SPECIFIC PROBLEM"   Note:   CARE PLAN ENTRY (see longitudinal plan of care for additional care plan information)  Current Barriers:  Patient with HTN in need of assistance with connection to community resources  Knowledge deficits and need for support,  education and care coordination related to community resources support  ADL IADL limitations and Limited access to caregiver  Clinical Goal(s)  Over the next 90 days, patient will work with the Department of Human Services to follow up on referral for CAP services  Interventions provided by LCSW: Assessed patient's care coordination needs related to need for in home support due to ADL and IADL limitations-discussed ongoing care management follow up  Patient's daughter remains primary caregiver and actively assists with daily care and medical follow up until alternative plan can be made Confirmed that [facility] PT continues-actively participates-mobility has improved-uses a cain Patient's daughter previously confirmed that CAPD-DA services have been approved-patient now on waiting list and will be contacted for a in home assessment once name comes up-patient has not been contacted yet  [facility] contacted-left message with Minus Liberty with the Department of Human Services-regarding patient's status Positive reinforcement provided for motivation to provide care and close follow up with the CAP-DA program   Patient Self Care Activities & Deficits:  Patient is unable to independently navigate community resource options without care coordination support  Acknowledges deficits and is motivated to resolve concern  Unable to perform ADLs independently Unable to perform IADLs independently Patient's daughter available for support, however lives in [place], [place]  Please see past updates related to this goal by clicking on the "Past Updates" button in the selected goal         Follow Up Plan: Appointment scheduled for SW follow up with client by phone on:  12/19/21   Elliot Gurney, [place] Worker  [facility] [phone]

## 2021-11-29 NOTE — Telephone Encounter (Signed)
Amy advised.    Thanks,   -Mickel Baas

## 2021-11-29 NOTE — Telephone Encounter (Signed)
That's fine

## 2021-11-30 DIAGNOSIS — M4802 Spinal stenosis, cervical region: Secondary | ICD-10-CM | POA: Diagnosis not present

## 2021-11-30 DIAGNOSIS — Z9181 History of falling: Secondary | ICD-10-CM | POA: Diagnosis not present

## 2021-11-30 DIAGNOSIS — Z87891 Personal history of nicotine dependence: Secondary | ICD-10-CM | POA: Diagnosis not present

## 2021-11-30 DIAGNOSIS — I69354 Hemiplegia and hemiparesis following cerebral infarction affecting left non-dominant side: Secondary | ICD-10-CM | POA: Diagnosis not present

## 2021-11-30 DIAGNOSIS — M8938 Hypertrophy of bone, other site: Secondary | ICD-10-CM | POA: Diagnosis not present

## 2021-11-30 DIAGNOSIS — I700 Atherosclerosis of aorta: Secondary | ICD-10-CM | POA: Diagnosis not present

## 2021-11-30 DIAGNOSIS — I10 Essential (primary) hypertension: Secondary | ICD-10-CM | POA: Diagnosis not present

## 2021-11-30 DIAGNOSIS — Z86711 Personal history of pulmonary embolism: Secondary | ICD-10-CM | POA: Diagnosis not present

## 2021-11-30 DIAGNOSIS — H547 Unspecified visual loss: Secondary | ICD-10-CM | POA: Diagnosis not present

## 2021-11-30 DIAGNOSIS — Z7901 Long term (current) use of anticoagulants: Secondary | ICD-10-CM | POA: Diagnosis not present

## 2021-11-30 DIAGNOSIS — I69398 Other sequelae of cerebral infarction: Secondary | ICD-10-CM | POA: Diagnosis not present

## 2021-11-30 DIAGNOSIS — E785 Hyperlipidemia, unspecified: Secondary | ICD-10-CM | POA: Diagnosis not present

## 2021-11-30 DIAGNOSIS — R918 Other nonspecific abnormal finding of lung field: Secondary | ICD-10-CM | POA: Diagnosis not present

## 2021-12-05 DIAGNOSIS — R911 Solitary pulmonary nodule: Secondary | ICD-10-CM | POA: Diagnosis not present

## 2021-12-05 DIAGNOSIS — R338 Other retention of urine: Secondary | ICD-10-CM | POA: Diagnosis not present

## 2021-12-06 DIAGNOSIS — Z87891 Personal history of nicotine dependence: Secondary | ICD-10-CM | POA: Diagnosis not present

## 2021-12-06 DIAGNOSIS — M8938 Hypertrophy of bone, other site: Secondary | ICD-10-CM | POA: Diagnosis not present

## 2021-12-06 DIAGNOSIS — I69354 Hemiplegia and hemiparesis following cerebral infarction affecting left non-dominant side: Secondary | ICD-10-CM | POA: Diagnosis not present

## 2021-12-06 DIAGNOSIS — M4802 Spinal stenosis, cervical region: Secondary | ICD-10-CM | POA: Diagnosis not present

## 2021-12-06 DIAGNOSIS — E785 Hyperlipidemia, unspecified: Secondary | ICD-10-CM | POA: Diagnosis not present

## 2021-12-06 DIAGNOSIS — I700 Atherosclerosis of aorta: Secondary | ICD-10-CM | POA: Diagnosis not present

## 2021-12-06 DIAGNOSIS — I10 Essential (primary) hypertension: Secondary | ICD-10-CM | POA: Diagnosis not present

## 2021-12-06 DIAGNOSIS — I69398 Other sequelae of cerebral infarction: Secondary | ICD-10-CM | POA: Diagnosis not present

## 2021-12-06 DIAGNOSIS — Z9181 History of falling: Secondary | ICD-10-CM | POA: Diagnosis not present

## 2021-12-06 DIAGNOSIS — H547 Unspecified visual loss: Secondary | ICD-10-CM | POA: Diagnosis not present

## 2021-12-06 DIAGNOSIS — Z86711 Personal history of pulmonary embolism: Secondary | ICD-10-CM | POA: Diagnosis not present

## 2021-12-06 DIAGNOSIS — Z7901 Long term (current) use of anticoagulants: Secondary | ICD-10-CM | POA: Diagnosis not present

## 2021-12-06 DIAGNOSIS — R918 Other nonspecific abnormal finding of lung field: Secondary | ICD-10-CM | POA: Diagnosis not present

## 2021-12-11 ENCOUNTER — Telehealth: Payer: Self-pay | Admitting: Family Medicine

## 2021-12-11 NOTE — Telephone Encounter (Signed)
Amy therapist from [facility], caled to inform that patient's wife died recently and he wants to reschedule his PT Therapy unitl next week

## 2021-12-15 DIAGNOSIS — E7800 Pure hypercholesterolemia, unspecified: Secondary | ICD-10-CM | POA: Diagnosis not present

## 2021-12-15 DIAGNOSIS — I10 Essential (primary) hypertension: Secondary | ICD-10-CM

## 2021-12-18 DIAGNOSIS — M4802 Spinal stenosis, cervical region: Secondary | ICD-10-CM | POA: Diagnosis not present

## 2021-12-18 DIAGNOSIS — M8938 Hypertrophy of bone, other site: Secondary | ICD-10-CM | POA: Diagnosis not present

## 2021-12-18 DIAGNOSIS — Z87891 Personal history of nicotine dependence: Secondary | ICD-10-CM | POA: Diagnosis not present

## 2021-12-18 DIAGNOSIS — Z7901 Long term (current) use of anticoagulants: Secondary | ICD-10-CM | POA: Diagnosis not present

## 2021-12-18 DIAGNOSIS — Z86711 Personal history of pulmonary embolism: Secondary | ICD-10-CM | POA: Diagnosis not present

## 2021-12-18 DIAGNOSIS — I69354 Hemiplegia and hemiparesis following cerebral infarction affecting left non-dominant side: Secondary | ICD-10-CM | POA: Diagnosis not present

## 2021-12-18 DIAGNOSIS — I700 Atherosclerosis of aorta: Secondary | ICD-10-CM | POA: Diagnosis not present

## 2021-12-18 DIAGNOSIS — Z9181 History of falling: Secondary | ICD-10-CM | POA: Diagnosis not present

## 2021-12-18 DIAGNOSIS — R918 Other nonspecific abnormal finding of lung field: Secondary | ICD-10-CM | POA: Diagnosis not present

## 2021-12-18 DIAGNOSIS — H547 Unspecified visual loss: Secondary | ICD-10-CM | POA: Diagnosis not present

## 2021-12-18 DIAGNOSIS — I69398 Other sequelae of cerebral infarction: Secondary | ICD-10-CM | POA: Diagnosis not present

## 2021-12-18 DIAGNOSIS — E785 Hyperlipidemia, unspecified: Secondary | ICD-10-CM | POA: Diagnosis not present

## 2021-12-18 DIAGNOSIS — I10 Essential (primary) hypertension: Secondary | ICD-10-CM | POA: Diagnosis not present

## 2021-12-19 ENCOUNTER — Ambulatory Visit: Payer: Medicare Other | Admitting: Emergency Medicine

## 2021-12-20 ENCOUNTER — Telehealth: Payer: Medicare Other

## 2022-01-10 DIAGNOSIS — R338 Other retention of urine: Secondary | ICD-10-CM | POA: Diagnosis not present

## 2022-01-10 DIAGNOSIS — R8271 Bacteriuria: Secondary | ICD-10-CM | POA: Diagnosis not present

## 2022-01-11 ENCOUNTER — Other Ambulatory Visit (HOSPITAL_COMMUNITY): Payer: Self-pay | Admitting: Urology

## 2022-01-11 ENCOUNTER — Other Ambulatory Visit: Payer: Self-pay | Admitting: Urology

## 2022-01-11 DIAGNOSIS — C61 Malignant neoplasm of prostate: Secondary | ICD-10-CM

## 2022-01-20 ENCOUNTER — Emergency Department (HOSPITAL_COMMUNITY): Payer: Medicare Other

## 2022-01-20 ENCOUNTER — Encounter (HOSPITAL_COMMUNITY): Payer: Self-pay | Admitting: Emergency Medicine

## 2022-01-20 ENCOUNTER — Other Ambulatory Visit: Payer: Self-pay

## 2022-01-20 ENCOUNTER — Emergency Department (HOSPITAL_COMMUNITY)
Admission: EM | Admit: 2022-01-20 | Discharge: 2022-01-20 | Disposition: A | Discharge status: Home / Self Care | Payer: Medicare Other | Attending: Emergency Medicine | Admitting: Emergency Medicine

## 2022-01-20 DIAGNOSIS — I2510 Atherosclerotic heart disease of native coronary artery without angina pectoris: Secondary | ICD-10-CM | POA: Diagnosis not present

## 2022-01-20 DIAGNOSIS — N3000 Acute cystitis without hematuria: Secondary | ICD-10-CM

## 2022-01-20 DIAGNOSIS — I10 Essential (primary) hypertension: Secondary | ICD-10-CM | POA: Insufficient documentation

## 2022-01-20 DIAGNOSIS — R569 Unspecified convulsions: Secondary | ICD-10-CM | POA: Diagnosis not present

## 2022-01-20 DIAGNOSIS — R000 Tachycardia, unspecified: Secondary | ICD-10-CM | POA: Diagnosis not present

## 2022-01-20 DIAGNOSIS — Z7901 Long term (current) use of anticoagulants: Secondary | ICD-10-CM | POA: Insufficient documentation

## 2022-01-20 DIAGNOSIS — Z79899 Other long term (current) drug therapy: Secondary | ICD-10-CM | POA: Diagnosis not present

## 2022-01-20 DIAGNOSIS — R911 Solitary pulmonary nodule: Secondary | ICD-10-CM | POA: Diagnosis not present

## 2022-01-20 LAB — URINALYSIS, ROUTINE W REFLEX MICROSCOPIC
Bilirubin Urine: NEGATIVE
Glucose, UA: NEGATIVE mg/dL
Hgb urine dipstick: NEGATIVE
Ketones, ur: NEGATIVE mg/dL
Nitrite: POSITIVE — AB
Protein, ur: NEGATIVE mg/dL
Specific Gravity, Urine: 1.015 (ref 1.005–1.030)
pH: 6 (ref 5.0–8.0)

## 2022-01-20 LAB — COMPREHENSIVE METABOLIC PANEL
ALT: 11 U/L (ref 0–44)
AST: 20 U/L (ref 15–41)
Albumin: 3.9 g/dL (ref 3.5–5.0)
Alkaline Phosphatase: 58 U/L (ref 38–126)
Anion gap: 8 (ref 5–15)
BUN: 11 mg/dL (ref 8–23)
CO2: 24 mmol/L (ref 22–32)
Calcium: 9.5 mg/dL (ref 8.9–10.3)
Chloride: 103 mmol/L (ref 98–111)
Creatinine, Ser: 0.92 mg/dL (ref 0.61–1.24)
GFR, Estimated: >60 mL/min (ref >60)
Glucose, Bld: 124 mg/dL — ABNORMAL HIGH (ref 70–99)
Potassium: 4 mmol/L (ref 3.5–5.1)
Sodium: 135 mmol/L (ref 135–145)
Total Bilirubin: 0.7 mg/dL (ref 0.3–1.2)
Total Protein: 6.9 g/dL (ref 6.5–8.1)

## 2022-01-20 LAB — CBC WITH DIFFERENTIAL/PLATELET
Abs Immature Granulocytes: 0.01 10*3/uL (ref 0.00–0.07)
Basophils Absolute: 0 10*3/uL (ref 0.0–0.1)
Basophils Relative: 1 %
Eosinophils Absolute: 0 10*3/uL (ref 0.0–0.5)
Eosinophils Relative: 1 %
HCT: 40.7 % (ref 39.0–52.0)
Hemoglobin: 12.5 g/dL — ABNORMAL LOW (ref 13.0–17.0)
Immature Granulocytes: 0 %
Lymphocytes Relative: 23 %
Lymphs Abs: 1.9 10*3/uL (ref 0.7–4.0)
MCH: 28.4 pg (ref 26.0–34.0)
MCHC: 30.7 g/dL (ref 30.0–36.0)
MCV: 92.5 fL (ref 80.0–100.0)
Monocytes Absolute: 0.5 10*3/uL (ref 0.1–1.0)
Monocytes Relative: 6 %
Neutro Abs: 5.6 10*3/uL (ref 1.7–7.7)
Neutrophils Relative %: 69 %
Platelets: 171 10*3/uL (ref 150–400)
RBC: 4.4 MIL/uL (ref 4.22–5.81)
RDW: 14.5 % (ref 11.5–15.5)
WBC: 8.1 10*3/uL (ref 4.0–10.5)
nRBC: 0 % (ref 0.0–0.2)

## 2022-01-20 LAB — URINALYSIS, MICROSCOPIC (REFLEX)

## 2022-01-20 MED ORDER — FOSFOMYCIN TROMETHAMINE 3 G PO PACK
3.0000 g | PACK | Freq: Once | ORAL | Status: AC
  Administered 2022-01-20: 3 g via ORAL
  Filled 2022-01-20: qty 3

## 2022-01-20 MED ORDER — LEVETIRACETAM 1000 MG PO TABS: 2000.0000 mg | ORAL_TABLET | Freq: Two times a day (BID) | ORAL | 0 refills | Status: DC

## 2022-01-20 MED ORDER — LORAZEPAM 2 MG/ML IJ SOLN
2.0000 mg | Freq: Once | INTRAMUSCULAR | Status: AC
  Administered 2022-01-20: 2 mg via INTRAVENOUS
  Filled 2022-01-20: qty 1

## 2022-01-20 MED ORDER — LEVETIRACETAM IN NACL 1000 MG/100ML IV SOLN
1000.0000 mg | Freq: Once | INTRAVENOUS | Status: AC
  Administered 2022-01-20: 1000 mg via INTRAVENOUS
  Filled 2022-01-20: qty 100

## 2022-01-20 NOTE — Discharge Instructions (Addendum)
Increase the Keppra to 2000 mg twice a day.  Follow-up with Dr. Delice Lesch and she may further adjust the medicine.  It appears as if you have a urinary tract infection.  You have been treated here with fosfomycin.  You will be notified if you need another dose.

## 2022-01-20 NOTE — ED Provider Notes (Signed)
[facility] EMERGENCY DEPARTMENT Provider Note   CSN: [id number] Arrival date & time: 01/20/22  1331     History  Chief Complaint  Patient presents with   Seizures    Travis Salazar. is a 86 y.o. male.  The history is provided by the patient and medical records. No language interpreter was used.  Seizures Seizure activity on arrival: yes   Seizure type:  Focal Episode characteristics: abnormal movements and focal shaking   Return to baseline: no   Duration:  1 hour Progression:  Unchanged Recent head injury:  No recent head injuries History of seizures: yes       Home Medications Prior to Admission medications   Medication Sig Start Date End Date Taking? Authorizing Provider  allopurinol (ZYLOPRIM) 300 MG tablet Take 300 mg by mouth daily as needed.    [provider]  amLODipine (NORVASC) 10 MG tablet TAKE 1 TABLET BY MOUTH EVERY DAY 10/26/21   [name], MD  apixaban (ELIQUIS) 5 MG TABS tablet Take 1 tablet (5 mg total) by mouth 2 (two) times daily. 11/05/21   [name], MD  atorvastatin (LIPITOR) 40 MG tablet TAKE 1 TABLET (40 MG TOTAL) BY MOUTH DAILY AT 6 PM. 11/19/21   [name], MD  levETIRAcetam (KEPPRA) 1000 MG tablet Take 1 tablet (1,000 mg total) by mouth 2 (two) times daily. 09/20/21 10/20/21  Cameron Sprang, MD  levETIRAcetam (KEPPRA) 500 MG tablet Take 3 tablets (1,500 mg total) by mouth 2 (two) times daily. 10/07/21 11/06/21  Elgergawy, Silver Huguenin, MD  metoprolol tartrate (LOPRESSOR) 25 MG tablet Take one-half tablet (12.5 mg total) by mouth 2 (two) times daily. 09/21/21 10/21/21  [name], MD  tamsulosin (FLOMAX) 0.4 MG CAPS capsule Take 0.4 mg by mouth daily. 09/21/21   [provider]      Allergies    Patient has no known allergies.    Review of Systems   Review of Systems  Constitutional:  Negative for chills, diaphoresis, fatigue and fever.  HENT:  Negative for  congestion.   Eyes:  Negative for visual disturbance.  Respiratory:  Negative for cough, chest tightness, shortness of breath and wheezing.   Cardiovascular:  Negative for chest pain.  Gastrointestinal:  Negative for abdominal pain, constipation, diarrhea, nausea and vomiting.  Genitourinary:  Negative for dysuria and flank pain.  Musculoskeletal:  Negative for back pain, neck pain and neck stiffness.  Skin:  Negative for rash and wound.  Neurological:  Positive for seizures. Negative for light-headedness, numbness and headaches.  Psychiatric/Behavioral:  Negative for agitation and confusion.   All other systems reviewed and are negative.  Physical Exam Updated Vital Signs Pulse (!) 115    Temp 98 F (36.7 C) (Oral)    Resp 18    Ht 5\' 8"  (1.727 m)    Wt 85.3 kg    SpO2 100%    BMI 28.59 kg/m  Physical Exam Vitals and nursing note reviewed.  Constitutional:      General: He is not in acute distress.    Appearance: He is well-developed. He is not ill-appearing, toxic-appearing or diaphoretic.  HENT:     Head: Normocephalic and atraumatic.     Nose: No congestion or rhinorrhea.     Mouth/Throat:     Mouth: Mucous membranes are moist.     Pharynx: No oropharyngeal exudate or posterior oropharyngeal erythema.  Eyes:     Extraocular Movements: Extraocular  movements intact.     Conjunctiva/sclera: Conjunctivae normal.     Pupils: Pupils are equal, round, and reactive to light.  Cardiovascular:     Rate and Rhythm: Normal rate and regular rhythm.     Heart sounds: No murmur heard. Pulmonary:     Effort: Pulmonary effort is normal. No respiratory distress.     Breath sounds: Normal breath sounds.  Abdominal:     Palpations: Abdomen is soft.     Tenderness: There is no abdominal tenderness. There is no right CVA tenderness, left CVA tenderness, guarding or rebound.  Musculoskeletal:        General: No swelling or tenderness.     Cervical back: Neck supple. No tenderness.     Right  lower leg: No edema.     Left lower leg: No edema.  Skin:    General: Skin is warm and dry.     Capillary Refill: Capillary refill takes less than 2 seconds.     Findings: No erythema or rash.  Neurological:     Mental Status: He is alert.     Sensory: No sensory deficit.     Motor: No weakness.     Comments: Patient focally seizing on left face and left arm on arrival.  Psychiatric:        Mood and Affect: Mood normal.    ED Results / Procedures / Treatments   Labs (all labs ordered are listed, but only abnormal results are displayed) Labs Reviewed  URINE CULTURE  CBC WITH DIFFERENTIAL/PLATELET  COMPREHENSIVE METABOLIC PANEL  URINALYSIS, ROUTINE W REFLEX MICROSCOPIC    EKG EKG Interpretation  Date/Time:  Sunday January 20 2022 13:49:30 EST Ventricular Rate:  95 PR Interval:  262 QRS Duration: 100 QT Interval:  345 QTC Calculation: 434 R Axis:   32 Text Interpretation: Sinus tachycardia Paired ventricular premature complexes Prolonged PR interval when compared to prior,  new PVC No STEMI Confirmed by Antony Blackbird [phone]) on 01/20/2022 1:59:38 PM  Radiology CT HEAD WO CONTRAST (5MM)  Result Date: 01/20/2022 CLINICAL DATA:  Altered mental status EXAM: CT HEAD WITHOUT CONTRAST TECHNIQUE: Contiguous axial images were obtained from the base of the skull through the vertex without intravenous contrast. RADIATION DOSE REDUCTION: This exam was performed according to the departmental dose-optimization program which includes automated exposure control, adjustment of the mA and/or kV according to patient size and/or use of iterative reconstruction technique. COMPARISON:  10/06/2021 FINDINGS: Motion artifacts limit evaluation.  Additional images were repeated. Brain: No acute intracranial findings are seen. There is large area of encephalomalacia in the right frontal temporal and parietal lobes with no significant change. Cortical sulci are prominent. Vascular: Unremarkable. Skull:  Unremarkable Sinuses/Orbits: Unremarkable. Other: None IMPRESSION: Motion limited study. As far as seen, no acute intracranial findings are seen. Large old infarct is seen in the right MCA distribution in the frontal and parietal lobes. Atrophy. Electronically Signed   By: Elmer Picker M.D.   On: 01/20/2022 14:47    Procedures Procedures    Medications Ordered in ED Medications  LORazepam (ATIVAN) injection 2 mg (2 mg Intravenous Given 01/20/22 1400)  levETIRAcetam (KEPPRA) IVPB 1000 mg/100 mL premix (1,000 mg Intravenous New Bag/Given 01/20/22 1409)    ED Course/ Medical Decision Making/ A&P                           Medical Decision Making Amount and/or Complexity of Data Reviewed Labs: ordered. Radiology: ordered.  Risk  Prescription drug management.   Esequiel Espn Zeman. is a 86 y.o. male with a past medical history significant for previous DVT, hypertension, CAD, emphysema, prior stroke, previous PE on Eliquis, and seizures who presents with seizure-like activity.  According to EMS report to nursing, patient has had seizure for the last hour or so with focal shaking in his left face and left arm.  According to patient, he missed 1 dose of Keppra last night before bed and then has been seizing for approximately 1 hour prior to arrival.   Patient is denying recent fevers, chills, chest pain, shortness of breath, cough, nausea, vomiting, constipation, diarrhea, or urinary changes.  No recent trauma or other medication changes aside from missing his dose of Keppra last night.  He reports he has not had a seizure in several months.  On exam, patient is having seizure-like activity in the left face and left arm.  Left leg is not moving.  He has intact sensation and strength throughout but is focally seizing.  Neurology quickly came to the bedside after I called and they agree with focal seizures.  They recommended Ativan which we ordered and loading with Keppra and the seizure has  terminated.  They recommended labs and CT imaging and if his work-up is reassuring and he prove stability for several hours of no further seizures, he is likely safe for discharge home to discuss increasing his Keppra and follow-up with neurology.  Anticipate reassessment after work-up has been completed and after monitoring and p.o. challenge.  Care transferred to oncoming team while waiting for further work-up.             Final Clinical Impression(s) / ED Diagnoses Final diagnoses:  Seizure ([place])    Clinical Impression: 1. Seizure [facility])     Disposition: Care transferred to oncoming team while waiting for further work-up.     This note was prepared with assistance of Systems analyst. Occasional wrong-word or sound-a-like substitutions may have occurred due to the inherent limitations of voice recognition software.      Datrell Dunton, Gwenyth Allegra, MD 01/20/22 1537

## 2022-01-20 NOTE — ED Triage Notes (Signed)
Patient brought in by daughter concerned patient is having seizure. Patient reports hx of seizures. Patient twitching on left side for past hour. Patient is alert and orientated x 4 and answering all questions in triage.

## 2022-01-20 NOTE — ED Notes (Signed)
Unable to obtain BP in triage due to patients twitching.

## 2022-01-20 NOTE — ED Provider Notes (Signed)
°  Physical Exam  BP (!) 98/51    Pulse 71    Temp 98 F (36.7 C) (Oral)    Resp (!) 27    Ht 5\' 8"  (1.727 m)    Wt 85.3 kg    SpO2 100%    BMI 28.59 kg/m   Physical Exam  Procedures  Procedures  ED Course / MDM    Medical Decision Making Amount and/or Complexity of Data Reviewed Labs: ordered. Radiology: ordered.  Risk Prescription drug management.   Received patient in signout.  Partial seizure.  Now controlled with loading of Keppra.  Had been on Keppra but had missed a dose.  Seen by neurology in the ER.  Discussed with Dr. Theda Sers.  Will increase Keppra from 1500 twice a day to 2000. Also found potentially to have urinary tract infection.  Last 2 cultures have showed resistant E. coli.  Discussed with pharmacy.  Will give a dose of fosfomycin here.  Culture should be back in a couple days.  If still same ESBL likely require a second dose of the fosfomycin.  However appears stable for discharge home.        Davonna Belling, MD 01/20/22 2027

## 2022-01-21 ENCOUNTER — Telehealth: Payer: Self-pay | Admitting: Neurology

## 2022-01-21 ENCOUNTER — Encounter (HOSPITAL_COMMUNITY)
Admission: RE | Admit: 2022-01-21 | Discharge: 2022-01-21 | Disposition: A | Discharge status: Home / Self Care | Payer: Medicare Other | Source: Ambulatory Visit | Attending: Urology | Admitting: Urology

## 2022-01-21 DIAGNOSIS — N281 Cyst of kidney, acquired: Secondary | ICD-10-CM | POA: Diagnosis not present

## 2022-01-21 DIAGNOSIS — K862 Cyst of pancreas: Secondary | ICD-10-CM | POA: Diagnosis not present

## 2022-01-21 DIAGNOSIS — C61 Malignant neoplasm of prostate: Secondary | ICD-10-CM | POA: Diagnosis present

## 2022-01-21 MED ORDER — TECHNETIUM TC 99M MEDRONATE IV KIT
21.5000 | PACK | Freq: Once | INTRAVENOUS | Status: AC
  Administered 2022-01-21: 21.5 via INTRAVENOUS

## 2022-01-21 NOTE — Telephone Encounter (Signed)
Barrett daughter called to get an  appt sooner than 04/02/22. He was in ER this weekend and was told to follow up with aquino. They also upped his meds to 2000 a day.

## 2022-01-22 ENCOUNTER — Encounter: Payer: Self-pay | Admitting: Emergency Medicine

## 2022-01-22 ENCOUNTER — Encounter: Payer: Self-pay | Admitting: Family Medicine

## 2022-01-22 ENCOUNTER — Other Ambulatory Visit: Payer: Self-pay

## 2022-01-22 ENCOUNTER — Telehealth (INDEPENDENT_AMBULATORY_CARE_PROVIDER_SITE_OTHER): Payer: Medicare Other | Admitting: Neurology

## 2022-01-22 ENCOUNTER — Encounter: Payer: Self-pay | Admitting: Neurology

## 2022-01-22 ENCOUNTER — Ambulatory Visit: Payer: Medicare Other | Admitting: Emergency Medicine

## 2022-01-22 ENCOUNTER — Telehealth (INDEPENDENT_AMBULATORY_CARE_PROVIDER_SITE_OTHER): Payer: Medicare Other | Admitting: Family Medicine

## 2022-01-22 VITALS — Ht 68.0 in | Wt 186.0 lb

## 2022-01-22 DIAGNOSIS — G40201 Localization-related (focal) (partial) symptomatic epilepsy and epileptic syndromes with complex partial seizures, not intractable, with status epilepticus: Secondary | ICD-10-CM | POA: Diagnosis not present

## 2022-01-22 DIAGNOSIS — G40909 Epilepsy, unspecified, not intractable, without status epilepticus: Secondary | ICD-10-CM

## 2022-01-22 DIAGNOSIS — N3000 Acute cystitis without hematuria: Secondary | ICD-10-CM | POA: Diagnosis not present

## 2022-01-22 DIAGNOSIS — R918 Other nonspecific abnormal finding of lung field: Secondary | ICD-10-CM

## 2022-01-22 HISTORY — DX: Acute cystitis without hematuria: N30.00

## 2022-01-22 LAB — URINE CULTURE: Culture: >=100000 — AB

## 2022-01-22 MED ORDER — LEVETIRACETAM 1000 MG PO TABS: 2000.0000 mg | ORAL_TABLET | Freq: Two times a day (BID) | ORAL | 6 refills | Status: DC

## 2022-01-22 MED ORDER — FOSFOMYCIN TROMETHAMINE 3 G PO PACK: 3.0000 g | PACK | Freq: Once | ORAL | 0 refills | Status: AC

## 2022-01-22 NOTE — Progress Notes (Signed)
Virtual Visit via Video Note The purpose of this virtual visit is to provide medical care while limiting exposure to the novel coronavirus.    Consent was obtained for video visit:  Yes.   Answered questions that patient had about telehealth interaction:  Yes.   I discussed the limitations, risks, security and privacy concerns of performing an evaluation and management service by telemedicine. I also discussed with the patient that there may be a patient responsible charge related to this service. The patient expressed understanding and agreed to proceed.  Pt location: Home Physician Location: office Name of referring provider:  [name],* I connected with Travis Salazar. at patients initiation/request on 01/22/2022 at 10:30 AM EST by video enabled telemedicine application and verified that I am speaking with the correct person using two identifiers. Pt MRN:  [id number] Pt DOB:  Apr 19, 1936 Video Participants:  Travis Salazar.; Tamie Choplin-Forney (daughter)   History of Present Illness:  The patient had a virtual video visit on 01/22/2022. He presents for an earlier visit after a breakthrough seizure on 01/20/22, per notes he had a seizure lasting an hour with focal twitching of left face and arm. Tamie reports he was able to respond during the seizure, no loss of awareness. He was refusing to go to the ER. He continued to have a focal seizure in the ER and was given IV Ativan and IV Levetiracetam with seizure cessation. He had missed 1 dose of Levetiracetam the night prior and was found to have a UTI (culture positive for >100,000 colonies/mL E. coli). Levetiracetam dose increased to 2000mg  BID. No further seizures since then, he is tolerating the higher dose of LEV. He denies any headaches, dizziness, no falls. Mood is good. He denies any dysuria or foul-smelling urine.    History on Initial Assessment 09/20/2021: This is an 86 year old right-handed man with a history of hypertension,  hyperlipidemia, PE on Eliquis, prostate cancer, prior R MCA stroke, presenting to establish care for new onset seizures. Records from his prior neurologist Dr. Leonie Man and hospital records were reviewed. His last visit with [facility] was in 11/2019. He was admitted to [facility] on 08/13/21, He recalls feeling dizzy, "something just was not right, he could talk and told his wife to call EMS and words were slurred. EMS notes indicated left-sided weakness with note of left facial and arm twitching on EMS arrival.  Head CT no acute changes, he was started on Dilantin and given repeated doses of Ativan due to continued focal seizure activity. As seizure activity increased, he was unable to move left arm at all, able to lift right arm without drift. EEG showed LPDs on the right hemisphere, maximal right temporal region with some evolution in frequency but no definite seizure. Background showed diffuse slowing and lateralized right hemisphere slowing. MRI brain no acute changes, right MCA encephalomalacia with chronic blood products. Follow-up EEG showed right temporal sharp waves. He had an MRI brain on 08/22/21 for vision changes, MRI brain no change from prior. He had an extensive DVT and known right lower lobe lung mass which he declined further workup on.   He lives with his wife, his daughter has been staying with them since hospitalization, managing medications. She denies any further seizures since 08/13/21, no staring/unresponsive episodes. He feels his left leg is 90% better. He has glaucoma with blurred vision and bilateral loss of peripheral vision. He denies any olfactory/gustatory hallucinations, deja vu, rising epigastric sensation, focal tingling/weakness, myoclonic jerks.  He denies any headaches, dizziness, neck/back pain, bowel/bladder dysfunction. He has been using a cane since hospital discharge, no falls. Sleep is good. No side effects on Levetiracetam 1000mg  BID. Mood is good, however his daughter reports he is  "moody."   Epilepsy Risk Factors:  right MCA encephalomalacia. Otherwise he had a normal birth and early development.  There is no history of febrile convulsions, CNS infections such as meningitis/encephalitis, significant traumatic brain injury, neurosurgical procedures, or family history of seizures.     Current Outpatient Medications on File Prior to Visit  Medication Sig Dispense Refill   allopurinol (ZYLOPRIM) 300 MG tablet Take 300 mg by mouth daily as needed.     amLODipine (NORVASC) 10 MG tablet TAKE 1 TABLET BY MOUTH EVERY DAY 90 tablet 1   apixaban (ELIQUIS) 5 MG TABS tablet Take 1 tablet (5 mg total) by mouth 2 (two) times daily. 60 tablet 3   atorvastatin (LIPITOR) 40 MG tablet TAKE 1 TABLET (40 MG TOTAL) BY MOUTH DAILY AT 6 PM. 90 tablet 0   levETIRAcetam (KEPPRA) 1000 MG tablet Take 2 tablets (2,000 mg total) by mouth 2 (two) times daily. 60 tablet 0   metoprolol tartrate (LOPRESSOR) 25 MG tablet Take one-half tablet (12.5 mg total) by mouth 2 (two) times daily. 180 tablet 1   tamsulosin (FLOMAX) 0.4 MG CAPS capsule Take 0.4 mg by mouth daily.     No current facility-administered medications on file prior to visit.     Observations/Objective:   Vitals:   01/22/22 0810  Weight: 186 lb (84.4 kg)  Height: 5\' 8"  (1.727 m)   GEN:  The patient appears stated age and is in NAD.  Neurological examination: Patient is awake, alert, able to follow commands. No aphasia or dysarthria. Intact fluency and comprehension. Cranial nerves: Extraocular movements intact with no nystagmus. No facial asymmetry. Motor: moves all extremities symmetrically, at least anti-gravity x 4. No incoordination on finger to nose testing. Gait: hunched posture, slow and cautious, no ataxia   Assessment and Plan:   This is an 86 yo RH man with a history of hypertension, hyperlipidemia, PE on Eliquis, prostate cancer, prior R MCA stroke, who had new onset focal status epilepticus last 08/13/21 with left  face/arm twitching.  EEG showed LPDs arising from the right temporal region/right hemisphere. MRI no acute changes, right MCA encephalomalacia. He had another prolonged focal seizure without impairment of awareness on 01/20/22 in the setting of missing a dose of medication and UTI. We discussed avoidance of seizure triggers. Continue Levatiracetam 200mg  BID, refills sent. Discussed urine culture findings with patient/daughter, advised to contact PCP for further recommendations. He is aware of [place] driving laws to stop driving until 6 months seizure-free. Follow-up as scheduled in April, they know to call for any changes.    Follow Up Instructions:   -I discussed the assessment and treatment plan with the patient. The patient was provided an opportunity to ask questions and all were answered. The patient agreed with the plan and demonstrated an understanding of the instructions.   The patient was advised to call back or seek an in-person evaluation if the symptoms worsen or if the condition fails to improve as anticipated.    Cameron Sprang, MD  CC: Dr. Rosanna Randy

## 2022-01-22 NOTE — Telephone Encounter (Signed)
Scheduled appt on 01/22/22 at 10:30am.

## 2022-01-22 NOTE — Patient Instructions (Signed)
We reviewed your CT scan of the chest today.  The right upper lobe pulmonary nodule is slightly larger than on your previous scan.  We discussed the options for follow-up including possible biopsy by bronchoscopy.  For now we will plan to repeat your CT scan of the chest in June 2023. Follow with Dr. Lamonte Sakai in June after your CT so we can review the results together.

## 2022-01-22 NOTE — Progress Notes (Signed)
Subjective:    Patient ID: Travis Lora., male    DOB: 1936/11/07, 86 y.o.   MRN: [id number]  HPI 86 year old former smoker (50 pack years) with a history of hypertension and hyperlipidemia, CVA, pulmonary embolism diagnosed in June 2022. He had been followed for an enlarging right upper lobe pulmonary nodule and has seen Dr. Julien Nordmann, Dr. Roxan Hockey.  He wanted to be conservative and decided not to pursue surgical resection or biopsy in February 2021.  He was admitted with status epilepticus in August required endotracheal intubation.  MRI brain 08/14/2021 and then again on 08/22/2021 did not show any evidence of metastatic disease.  CT chest 05/27/2021 reviewed by me, shows multiple pulmonary emboli, no mediastinal or hilar lymphadenopathy, mild centrilobular emphysema, interval increase in size of a lobulated 3.5 x 1.9 cm peripheral right upper lobe mass with several scattered right pulmonary micronodules some of which are calcified.  ROV 01/22/22 --Travis Salazar is an 86 year old gentleman, former smoker whom I have seen for an enlarging right upper lobe pulmonary mass.  He has wanted to be conservative, to avoid biopsy or surgical resection. PMH: Significant for CVA, PE, hypertension, seizure disorder. He was in ED w a seizure 2/5  CT scan of the chest performed 11/28/2021 reviewed by me, showed slight interval increase in size of his right upper lobe subpleural mass now 2.5 x 2.3 cm.  No evidence of lymphadenopathy or metastatic disease   Review of Systems As per HPI  Past Medical History:  Diagnosis Date   Hyperlipidemia    Hypertension    Seizures ([place])    Stroke ([place])      Family History  Problem Relation Age of Onset   COPD Mother    Heart attack Father      Social History   Socioeconomic History   Marital status: Married    Spouse name: [name]   Number of children: 4   Years of education: 10th grade   Highest education level: 9th grade  Occupational History    Occupation: retired  Tobacco Use   Smoking status: Former    Packs/day: 1.00    Years: 50.00    Pack years: 50.00    Types: Cigarettes    Quit date: 12/15/2009    Years since quitting: 12.1   Smokeless tobacco: Never  Vaping Use   Vaping Use: Never used  Substance and Sexual Activity   Alcohol use: No    Alcohol/week: 0.0 standard drinks   Drug use: No   Sexual activity: Not on file  Other Topics Concern   Not on file  Social History Narrative   ** Merged History Encounter **       Right handed    Social Determinants of Health   Financial Resource Strain: High Risk   Difficulty of Paying Living Expenses: Hard  Food Insecurity: No Food Insecurity   Worried About Running Out of Food in the Last Year: Never true   Ran Out of Food in the Last Year: Never true  Transportation Needs: No Transportation Needs   Lack of Transportation (Medical): No   Lack of Transportation (Non-Medical): No  Physical Activity: Insufficiently Active   Days of Exercise per Week: 7 days   Minutes of Exercise per Session: 20 min  Stress: No Stress Concern Present   Feeling of Stress : Not at all  Social Connections: Moderately Integrated   Frequency of Communication with Friends and Family: More than three times a week   Frequency of Social  Gatherings with Friends and Family: More than three times a week   Attends Religious Services: More than 4 times per year   Active Member of Clubs or Organizations: No   Attends Archivist Meetings: Never   Marital Status: Married  Human resources officer Violence: Not on file    Was exposed to auto-body paint [place] native  No Known Allergies   Outpatient Medications Prior to Visit  Medication Sig Dispense Refill   allopurinol (ZYLOPRIM) 300 MG tablet Take 300 mg by mouth daily as needed.     amLODipine (NORVASC) 10 MG tablet TAKE 1 TABLET BY MOUTH EVERY DAY 90 tablet 1   apixaban (ELIQUIS) 5 MG TABS tablet Take 1 tablet (5 mg total) by mouth 2 (two)  times daily. 60 tablet 3   atorvastatin (LIPITOR) 40 MG tablet TAKE 1 TABLET (40 MG TOTAL) BY MOUTH DAILY AT 6 PM. 90 tablet 0   fosfomycin (MONUROL) 3 g PACK Take 3 g by mouth once for 1 dose. 3 g 0   levETIRAcetam (KEPPRA) 1000 MG tablet Take 2 tablets (2,000 mg total) by mouth 2 (two) times daily. 120 tablet 6   metoprolol tartrate (LOPRESSOR) 25 MG tablet Take one-half tablet (12.5 mg total) by mouth 2 (two) times daily. 180 tablet 1   tamsulosin (FLOMAX) 0.4 MG CAPS capsule Take 0.4 mg by mouth daily.     No facility-administered medications prior to visit.         Objective:   Physical Exam Vitals:   01/22/22 1542  BP: 128/74  Pulse: 73  Temp: 98.2 F (36.8 C)  TempSrc: Oral  SpO2: 99%  Weight: 184 lb 3.2 oz (83.6 kg)  Height: 5\' 8"  (1.727 m)    Gen: Pleasant, well-nourished, in no distress,  normal affect  ENT: No lesions,  mouth clear,  oropharynx clear, no postnasal drip  Neck: No JVD, no stridor  Lungs: No use of accessory muscles, no crackles or wheezing on normal respiration, no wheeze on forced expiration  Cardiovascular: RRR, heart sounds normal, no murmur or gallops, no peripheral edem  Musculoskeletal: No deformities, no cyanosis or clubbing  Neuro: alert, awake, non focal  Skin: Warm, no lesions or rash      Assessment & Plan:  Mass of upper lobe of right lung Right upper lobe mass, slightly larger than on previous scan.  Most consistent with a primary malignancy.  Discussed the pros and cons of biopsy to facilitate treatment, probably SBRT.  At this time he is asymptomatic and he wants to remain conservative.  We will defer bronchoscopy or any other biopsy.  We will plan to repeat his CT scan of the chest in 6 months.  His daughter Travis Salazar was present and understands the plan as well.  If they discussed and changed her mind about the planned and they will contact me.     [name], MD, PhD 01/22/2022, 4:08 PM [facility] [phone] or if no answer before 7:00PM call [phone] For any issues after 7:00PM please call eLink [phone]

## 2022-01-22 NOTE — Patient Instructions (Signed)
Good to see you doing better.  Continue higher dose of Levetiracetam 1000mg : Take 2 tablets twice a day  2. Look into a different type of pillbox to help with taking medications regularly  3. Call PCP to discuss urine culture results  4. Follow-up as scheduled in April, call for any changes   Seizure Precautions: 1. If medication has been prescribed for you to prevent seizures, take it exactly as directed.  Do not stop taking the medicine without talking to your doctor first, even if you have not had a seizure in a long time.   2. Avoid activities in which a seizure would cause danger to yourself or to others.  Don't operate dangerous machinery, swim alone, or climb in high or dangerous places, such as on ladders, roofs, or girders.  Do not drive unless your doctor says you may.  3. If you have any warning that you may have a seizure, lay down in a safe place where you can't hurt yourself.    4.  No driving for 6 months from last seizure, as per [facility].   Please refer to the following link on the [facility] website for more information: http://www.epilepsyfoundation.org/answerplace/Social/driving/drivingu.cfm   5.  Maintain good sleep hygiene. Avoid alcohol.  6.  Contact your doctor if you have any problems that may be related to the medicine you are taking.  7.  Call 911 and bring the patient back to the ED if:        A.  The seizure lasts longer than 5 minutes.       B.  The patient doesn't awaken shortly after the seizure  C.  The patient has new problems such as difficulty seeing, speaking or moving  D.  The patient was injured during the seizure  E.  The patient has a temperature over 102 F (39C)  F.  The patient vomited and now is having trouble breathing

## 2022-01-22 NOTE — Assessment & Plan Note (Signed)
No further seizures, doing well on increased keppra, continue. Maintain f/u with Neuro.

## 2022-01-22 NOTE — Addendum Note (Signed)
Addended by: Dierdre Highman on: 01/22/2022 04:35 PM   Modules accepted: Orders

## 2022-01-22 NOTE — Assessment & Plan Note (Signed)
ESBL E coli on urine culture, s/p 1 dose of fosfomycin in ED. Question whether true UTI vs asymptomatic bacteruria given lack of symptoms. Nevertheless will provide second dose given already received first dose, likely does not need the third dose as currently asymptomatic. If develops symptoms, suggest recollection.

## 2022-01-22 NOTE — Assessment & Plan Note (Signed)
Right upper lobe mass, slightly larger than on previous scan.  Most consistent with a primary malignancy.  Discussed the pros and cons of biopsy to facilitate treatment, probably SBRT.  At this time he is asymptomatic and he wants to remain conservative.  We will defer bronchoscopy or any other biopsy.  We will plan to repeat his CT scan of the chest in 6 months.  His daughter Lynelle Smoke was present and understands the plan as well.  If they discussed and changed her mind about the planned and they will contact me.

## 2022-01-22 NOTE — Progress Notes (Signed)
MyChart Video Visit    Virtual Visit via Video Note   This visit type was conducted due to national recommendations for restrictions regarding the COVID-19 Pandemic (e.g. social distancing) in an effort to limit this patient's exposure and mitigate transmission in our community. This patient is at least at moderate risk for complications without adequate follow up. This format is felt to be most appropriate for this patient at this time. Physical exam was limited by quality of the video and audio technology used for the visit.   Patient location: home Provider location: home  I discussed the limitations of evaluation and management by telemedicine and the availability of in person appointments. The patient expressed understanding and agreed to proceed.  Patient: Travis Salazar.   DOB: 1936-10-02   86 y.o. Male  MRN: [id number] Visit Date: 01/22/2022  Today's healthcare provider: Myles Gip, DO   Chief Complaint  Patient presents with   Follow-up   Subjective    HPI   ER FOLLOW UP Hospital/facility: [facility] 2/5 Diagnosis: seizure in setting of missing 1 dose of keppra and UTI Procedures/tests:  - CT Head [place] Consultants: Neuro New medications:  - increased keppra to 2000mg  BID - received Ativan x1 and keppra load with cessation of seizure - received fosfomycin x1 in ED Discharge instructions:   Status: better - no further seizures. Tolerating increased keppra. - saw Neuro this am, goes back in April - no dysuria, increased frequency, incontinence.    Medications: Outpatient Medications Prior to Visit  Medication Sig   allopurinol (ZYLOPRIM) 300 MG tablet Take 300 mg by mouth daily as needed.   amLODipine (NORVASC) 10 MG tablet TAKE 1 TABLET BY MOUTH EVERY DAY   apixaban (ELIQUIS) 5 MG TABS tablet Take 1 tablet (5 mg total) by mouth 2 (two) times daily.   atorvastatin (LIPITOR) 40 MG tablet TAKE 1 TABLET (40 MG TOTAL) BY MOUTH DAILY AT 6 PM.   levETIRAcetam  (KEPPRA) 1000 MG tablet Take 2 tablets (2,000 mg total) by mouth 2 (two) times daily.   metoprolol tartrate (LOPRESSOR) 25 MG tablet Take one-half tablet (12.5 mg total) by mouth 2 (two) times daily.   tamsulosin (FLOMAX) 0.4 MG CAPS capsule Take 0.4 mg by mouth daily.   No facility-administered medications prior to visit.    Review of Systems per HPI    Objective    There were no vitals taken for this visit.   Physical Exam  Well appearing, in NAD. Speaks in full sentences. Comfortable WOB on RA. No resp distress.     Assessment & Plan     Problem List Items Addressed This Visit       Nervous and Auditory   Seizure disorder ([place]) - Primary    No further seizures, doing well on increased keppra, continue. Maintain f/u with Neuro.        Genitourinary   Acute cystitis without hematuria    ESBL E coli on urine culture, s/p 1 dose of fosfomycin in ED. Question whether true UTI vs asymptomatic bacteruria given lack of symptoms. Nevertheless will provide second dose given already received first dose, likely does not need the third dose as currently asymptomatic. If develops symptoms, suggest recollection.       Relevant Medications   fosfomycin (MONUROL) 3 g PACK       I discussed the assessment and treatment plan with the patient. The patient was provided an opportunity to ask questions and all were answered. The patient agreed  with the plan and demonstrated an understanding of the instructions.   The patient was advised to call back or seek an in-person evaluation if the symptoms worsen or if the condition fails to improve as anticipated.  I provided 5 minutes of non-face-to-face time during this encounter.   Myles Gip, [place] [phone] (phone) [phone] (fax)  [place]

## 2022-01-23 ENCOUNTER — Telehealth: Payer: Self-pay | Admitting: *Deleted

## 2022-01-23 NOTE — Telephone Encounter (Signed)
Post ED Visit - Positive Culture Follow-up  Culture report reviewed by antimicrobial stewardship pharmacist: [place] Team []  Elenor Quinones, Pharm.D. []  Heide Guile, Pharm.D., BCPS AQ-ID []  Parks Neptune, Pharm.D., BCPS []  Alycia Rossetti, Pharm.D., BCPS []  [place], Pharm.D., BCPS, AAHIVP []  Legrand Como, Pharm.D., BCPS, AAHIVP []  Salome Arnt, PharmD, BCPS []  Johnnette Gourd, PharmD, BCPS []  Hughes Better, PharmD, BCPS []  Leeroy Cha, PharmD []  Laqueta Linden, PharmD, BCPS []  Albertina Parr, PharmD  [place] Team []  Leodis Sias, PharmD []  Lindell Spar, PharmD []  Royetta Asal, PharmD []  Graylin Shiver, Rph []  Rema Fendt) Glennon Mac, PharmD []  Arlyn Dunning, PharmD []  Netta Cedars, PharmD []  Dia Sitter, PharmD []  Leone Haven, PharmD []  Gretta Arab, PharmD []  Theodis Shove, PharmD []  Peggyann Juba, PharmD []  Reuel Boom, PharmD   Positive urine culture Treated with Fosfomycin x 1 given in ED and no further patient follow-up is required at this time. Bertis Ruddy, Pharm D  Harlon [place] 01/23/2022, 9:28 AM

## 2022-01-25 ENCOUNTER — Other Ambulatory Visit: Payer: Self-pay

## 2022-01-25 MED ORDER — LEVETIRACETAM 1000 MG PO TABS: 2000.0000 mg | ORAL_TABLET | Freq: Two times a day (BID) | ORAL | 6 refills | Status: DC

## 2022-02-07 DIAGNOSIS — R338 Other retention of urine: Secondary | ICD-10-CM | POA: Diagnosis not present

## 2022-02-07 DIAGNOSIS — C7951 Secondary malignant neoplasm of bone: Secondary | ICD-10-CM | POA: Diagnosis not present

## 2022-02-11 NOTE — Progress Notes (Signed)
Argentina Ponder [place],acting as a scribe for [name], MD.,have documented all relevant documentation on the behalf of [name], MD,as directed by  [name], MD while in the presence of [name], MD.     Established patient visit   Patient: Travis Salazar.   DOB: 21-Oct-1936   86 y.o. Male  MRN: [id number] Visit Date: 02/12/2022  Today's healthcare provider: [name], MD   No chief complaint on file.  Subjective    HPI  Patient comes in today for follow-up after being seen for lung mass which she declined work-up.  He has had seizure disorder secondary to CVA.  He has had no recent seizures. His wife died at the end of last year.  Family members are looking in on the patient. He is slowly losing weight and does not have much of an appetite.  Emotionally he is doing okay but does miss his wife. Hypertension, follow-up  BP Readings from Last 3 Encounters:  02/12/22 120/82  01/22/22 128/74  01/20/22 116/67   Wt Readings from Last 3 Encounters:  02/12/22 180 lb (81.6 kg)  01/22/22 184 lb 3.2 oz (83.6 kg)  01/22/22 186 lb (84.4 kg)     He was last seen for hypertension 5 months ago.  BP at that visit was as above. Management since that visit includes none.  He reports good compliance with treatment. He is not having side effects.   Symptoms: No chest pain No chest pressure  No palpitations No syncope  No dyspnea No orthopnea  No paroxysmal nocturnal dyspnea Yes lower extremity edema   Pertinent labs: Lab Results  Component Value Date   CHOL 187 08/29/2020   HDL 40 08/29/2020   LDLCALC 125 (H) 08/29/2020   TRIG 89 08/14/2021   CHOLHDL 4.7 08/29/2020   Lab Results  Component Value Date   NA 135 01/20/2022   K 4.0 01/20/2022   CREATININE 0.92 01/20/2022   GFRNONAA >60 01/20/2022   GLUCOSE 124 (H) 01/20/2022   TSH 4.040 08/29/2020     The ASCVD Risk score (Arnett DK, et al., 2019) failed to calculate for the following  reasons:   The 2019 ASCVD risk score is only valid for ages 73 to 29   The patient has a prior MI or stroke diagnosis   ---------------------------------------------------------------------------------------------------   Medications: Outpatient Medications Prior to Visit  Medication Sig   allopurinol (ZYLOPRIM) 300 MG tablet Take 300 mg by mouth daily as needed.   amLODipine (NORVASC) 10 MG tablet TAKE 1 TABLET BY MOUTH EVERY DAY   apixaban (ELIQUIS) 5 MG TABS tablet Take 1 tablet (5 mg total) by mouth 2 (two) times daily.   atorvastatin (LIPITOR) 40 MG tablet TAKE 1 TABLET (40 MG TOTAL) BY MOUTH DAILY AT 6 PM.   levETIRAcetam (KEPPRA) 1000 MG tablet Take 2 tablets (2,000 mg total) by mouth 2 (two) times daily.   tamsulosin (FLOMAX) 0.4 MG CAPS capsule Take 0.4 mg by mouth daily.   metoprolol tartrate (LOPRESSOR) 25 MG tablet Take one-half tablet (12.5 mg total) by mouth 2 (two) times daily.   No facility-administered medications prior to visit.    Review of Systems      Objective    BP 120/82 (BP Location: Right Arm, Patient Position: Sitting, Cuff Size: Normal)    Pulse 76    Temp 98 F (36.7 C) (Oral)    Wt 180 lb (81.6 kg)    SpO2 100%  BMI 27.37 kg/m     Physical Exam Vitals reviewed.  Constitutional:      Appearance: He is well-developed.  HENT:     Head: Normocephalic and atraumatic.     Right Ear: External ear normal.     Left Ear: External ear normal.     Nose: Nose normal.  Eyes:     General: No scleral icterus.    Conjunctiva/sclera: Conjunctivae normal.  Neck:     Thyroid: No thyromegaly.  Cardiovascular:     Rate and Rhythm: Normal rate and regular rhythm.     Heart sounds: Normal heart sounds.  Pulmonary:     Effort: Pulmonary effort is normal.     Breath sounds: Normal breath sounds.  Abdominal:     Palpations: Abdomen is soft.  Musculoskeletal:     Right lower leg: No edema.     Left lower leg: No edema.  Skin:    General: Skin is warm and  dry.  Neurological:     General: No focal deficit present.     Mental Status: He is alert and oriented to person, place, and time.  Psychiatric:        Mood and Affect: Mood normal.        Behavior: Behavior normal.        Thought Content: Thought content normal.        Judgment: Judgment normal.      No results found for any visits on 02/12/22.  Assessment & Plan     1. Weight loss, unintentional Patient has multiple issues which could contribute to unintentional weight loss. Remeron 30 mg at bedtime MOST form sent home with patient and his daughter  2. Cerebrovascular accident (CVA) due to embolism of right middle cerebral artery (HCC) Risk factors treated  3. Seizure disorder ([place]) Seizures recently.  Followed by neurology. She has appointment in 6 weeks with neurology 4. Mass of upper lobe of right lung Declines tissue diagnosis/biopsy/surgery. Home term prognosis is poor.  5. Abnormal CT of the head   6. Nonrheumatic aortic valve stenosis    No follow-ups on file.      I, [name], MD, have reviewed all documentation for this visit. The documentation on 02/15/22 for the exam, diagnosis, procedures, and orders are all accurate and complete.    Harvard Zeiss Cranford Mon, MD  [facility] [phone] (phone) [phone] (fax)  [place]

## 2022-02-12 ENCOUNTER — Ambulatory Visit (INDEPENDENT_AMBULATORY_CARE_PROVIDER_SITE_OTHER): Payer: Medicare Other | Admitting: Family Medicine

## 2022-02-12 ENCOUNTER — Other Ambulatory Visit: Payer: Self-pay

## 2022-02-12 VITALS — BP 120/82 | HR 76 | Temp 98.0°F | Wt 180.0 lb

## 2022-02-12 DIAGNOSIS — R918 Other nonspecific abnormal finding of lung field: Secondary | ICD-10-CM

## 2022-02-12 DIAGNOSIS — I63411 Cerebral infarction due to embolism of right middle cerebral artery: Secondary | ICD-10-CM

## 2022-02-12 DIAGNOSIS — R634 Abnormal weight loss: Secondary | ICD-10-CM | POA: Diagnosis not present

## 2022-02-12 DIAGNOSIS — G40909 Epilepsy, unspecified, not intractable, without status epilepticus: Secondary | ICD-10-CM | POA: Diagnosis not present

## 2022-02-12 DIAGNOSIS — I350 Nonrheumatic aortic (valve) stenosis: Secondary | ICD-10-CM | POA: Diagnosis not present

## 2022-02-12 DIAGNOSIS — R930 Abnormal findings on diagnostic imaging of skull and head, not elsewhere classified: Secondary | ICD-10-CM | POA: Diagnosis not present

## 2022-02-12 MED ORDER — MIRTAZAPINE 30 MG PO TBDP: 30.0000 mg | ORAL_TABLET | Freq: Every day | ORAL | 11 refills | Status: DC

## 2022-03-07 ENCOUNTER — Other Ambulatory Visit: Payer: Self-pay | Admitting: Family Medicine

## 2022-03-09 ENCOUNTER — Other Ambulatory Visit: Payer: Self-pay | Admitting: Family Medicine

## 2022-03-13 ENCOUNTER — Telehealth: Payer: Self-pay | Admitting: Family Medicine

## 2022-03-13 ENCOUNTER — Other Ambulatory Visit: Payer: Self-pay | Admitting: *Deleted

## 2022-03-13 NOTE — Telephone Encounter (Signed)
[facility] faxed refill request for the following medications: ? ?mirtazapine (REMERON SOL-TAB) 30 MG disintegrating tablet  ? ?Please advise. ? ?

## 2022-03-14 ENCOUNTER — Telehealth: Payer: Self-pay | Admitting: Family Medicine

## 2022-03-14 DIAGNOSIS — I10 Essential (primary) hypertension: Secondary | ICD-10-CM

## 2022-03-14 MED ORDER — AMLODIPINE BESYLATE 10 MG PO TABS: 10.0000 mg | ORAL_TABLET | Freq: Every day | ORAL | 1 refills | Status: DC

## 2022-03-14 MED ORDER — METOPROLOL TARTRATE 25 MG PO TABS: 12.5000 mg | ORAL_TABLET | Freq: Two times a day (BID) | ORAL | 1 refills | Status: DC

## 2022-03-14 NOTE — Telephone Encounter (Signed)
[facility] faxed refill request for the following medications: ? ?amLODipine (NORVASC) 10 MG tablet  ? ?metoprolol tartrate (LOPRESSOR) 25 MG tablet  ? ? ?Please advise. ? ?

## 2022-03-24 ENCOUNTER — Emergency Department (HOSPITAL_COMMUNITY)
Admission: EM | Admit: 2022-03-24 | Discharge: 2022-03-25 | Disposition: A | Discharge status: Home / Self Care | Payer: Medicare Other | Attending: Emergency Medicine | Admitting: Emergency Medicine

## 2022-03-24 ENCOUNTER — Emergency Department (HOSPITAL_COMMUNITY): Payer: Medicare Other

## 2022-03-24 ENCOUNTER — Other Ambulatory Visit: Payer: Self-pay

## 2022-03-24 DIAGNOSIS — Z743 Need for continuous supervision: Secondary | ICD-10-CM | POA: Diagnosis not present

## 2022-03-24 DIAGNOSIS — Z7901 Long term (current) use of anticoagulants: Secondary | ICD-10-CM | POA: Diagnosis not present

## 2022-03-24 DIAGNOSIS — D72829 Elevated white blood cell count, unspecified: Secondary | ICD-10-CM | POA: Diagnosis not present

## 2022-03-24 DIAGNOSIS — J9811 Atelectasis: Secondary | ICD-10-CM | POA: Diagnosis not present

## 2022-03-24 DIAGNOSIS — R569 Unspecified convulsions: Secondary | ICD-10-CM | POA: Insufficient documentation

## 2022-03-24 DIAGNOSIS — R911 Solitary pulmonary nodule: Secondary | ICD-10-CM | POA: Diagnosis not present

## 2022-03-24 DIAGNOSIS — R531 Weakness: Secondary | ICD-10-CM | POA: Diagnosis not present

## 2022-03-24 LAB — MAGNESIUM: Magnesium: 2 mg/dL (ref 1.7–2.4)

## 2022-03-24 LAB — BASIC METABOLIC PANEL
Anion gap: 6 (ref 5–15)
BUN: 11 mg/dL (ref 8–23)
CO2: 25 mmol/L (ref 22–32)
Calcium: 9 mg/dL (ref 8.9–10.3)
Chloride: 101 mmol/L (ref 98–111)
Creatinine, Ser: 0.94 mg/dL (ref 0.61–1.24)
GFR, Estimated: >60 mL/min (ref >60)
Glucose, Bld: 103 mg/dL — ABNORMAL HIGH (ref 70–99)
Potassium: 5.3 mmol/L — ABNORMAL HIGH (ref 3.5–5.1)
Sodium: 132 mmol/L — ABNORMAL LOW (ref 135–145)

## 2022-03-24 LAB — CBC WITH DIFFERENTIAL/PLATELET
Abs Immature Granulocytes: 0.02 10*3/uL (ref 0.00–0.07)
Basophils Absolute: 0 10*3/uL (ref 0.0–0.1)
Basophils Relative: 1 %
Eosinophils Absolute: 0 10*3/uL (ref 0.0–0.5)
Eosinophils Relative: 1 %
HCT: 36.6 % — ABNORMAL LOW (ref 39.0–52.0)
Hemoglobin: 12.1 g/dL — ABNORMAL LOW (ref 13.0–17.0)
Immature Granulocytes: 0 %
Lymphocytes Relative: 20 %
Lymphs Abs: 1.2 10*3/uL (ref 0.7–4.0)
MCH: 29.8 pg (ref 26.0–34.0)
MCHC: 33.1 g/dL (ref 30.0–36.0)
MCV: 90.1 fL (ref 80.0–100.0)
Monocytes Absolute: 0.2 10*3/uL (ref 0.1–1.0)
Monocytes Relative: 4 %
Neutro Abs: 4.5 10*3/uL (ref 1.7–7.7)
Neutrophils Relative %: 74 %
Platelets: 137 10*3/uL — ABNORMAL LOW (ref 150–400)
RBC: 4.06 MIL/uL — ABNORMAL LOW (ref 4.22–5.81)
RDW: 15 % (ref 11.5–15.5)
WBC: 6.1 10*3/uL (ref 4.0–10.5)
nRBC: 0 % (ref 0.0–0.2)

## 2022-03-24 LAB — URINALYSIS, ROUTINE W REFLEX MICROSCOPIC
Bilirubin Urine: NEGATIVE
Glucose, UA: NEGATIVE mg/dL
Hgb urine dipstick: NEGATIVE
Ketones, ur: NEGATIVE mg/dL
Leukocytes,Ua: NEGATIVE
Nitrite: NEGATIVE
Protein, ur: NEGATIVE mg/dL
Specific Gravity, Urine: 1.011 (ref 1.005–1.030)
pH: 7 (ref 5.0–8.0)

## 2022-03-24 MED ORDER — LACOSAMIDE 50 MG PO TABS: 50.0000 mg | ORAL_TABLET | Freq: Two times a day (BID) | ORAL | 2 refills | Status: DC

## 2022-03-24 MED ORDER — LACOSAMIDE 200 MG/20ML IV SOLN
200.0000 mg | Freq: Once | INTRAVENOUS | Status: AC
  Administered 2022-03-25: 200 mg via INTRAVENOUS
  Filled 2022-03-24: qty 20

## 2022-03-24 NOTE — Discharge Instructions (Signed)
You are seen in the emergency department for evaluation of a seizure.  Your lab work did not show any significant abnormalities.  Neurology is recommending you start on lacosamide twice a day and continue your Keppra.  Follow-up with your neurologist.  Return if any worsening or concerning symptoms. ?

## 2022-03-24 NOTE — ED Triage Notes (Signed)
Pt bib [facility] after focal seizure with tremors L side of face and L arm for 30 min. Hx seizures, on Keppra. 2.'5mg'$  Versed given by EMS, pt back to baseline. 20G R FA. BP 132/74, HR 76, RR 18, O2 99% RA, CBG 137. ?

## 2022-03-24 NOTE — Progress Notes (Signed)
ON CALL PHONE CONSULT ? ?Call from Dr. Butler'@[email or web address] ? ? ?Discussion: Breakthrough seizure in a patient maxed on Keppra. ?Back to baseline ? ?Recs: ?Add on Vimpat-load 200 mg IV x1 followed by 50 mg twice daily p.o. ?Follow-up with outpatient neurologist ? ?-- ?Marquis Lunch, MD ?Neurologist ?[facility] ?Pager: [phone] ? ?

## 2022-03-24 NOTE — ED Provider Notes (Signed)
?[place] ?Provider Note ? ? ?CSN: [id number] ?Arrival date & time: 03/24/22  2116 ? ?  ? ?History ? ?Chief Complaint  ?Patient presents with  ? Seizures  ? ? ?Travis Salazar. is a 86 y.o. male.  He is brought in by EMS for evaluation of a seizure that lasted about 30 minutes long.  Reportedly was left face and left arm shaking.  Patient received Versed by EMS with improvement.  He denies any current complaints.  He states he has been compliant with medications and denies any recent illness.  Per chart patient was in here in February for seizures and they increased his Keppra dose. ? ?The history is provided by the patient and the EMS personnel.  ?Seizures ?Seizure activity on arrival: no   ?Seizure type:  Partial simple ?Initial focality:  Left-sided ?Episode characteristics: focal shaking   ?Return to baseline: yes   ?Duration:  30 minutes ?Timing:  Once ?Number of seizures this episode:  1 ?Progression:  Resolved ?Context: not fever   ?Recent head injury:  No recent head injuries ?PTA treatment:  Midazolam ?History of seizures: yes   ? ?  ? ?Home Medications ?Prior to Admission medications   ?Medication Sig Start Date End Date Taking? Authorizing Provider  ?allopurinol (ZYLOPRIM) 300 MG tablet Take 300 mg by mouth daily as needed.    [provider]  ?amLODipine (NORVASC) 10 MG tablet Take 1 tablet (10 mg total) by mouth daily. 03/14/22   [name], MD  ?atorvastatin (LIPITOR) 40 MG tablet TAKE 1 TABLET (40 MG TOTAL) BY MOUTH DAILY AT 6 PM. 11/19/21   [name], MD  ?Arne Cleveland 5 MG TABS tablet TAKE 1 TABLET BY MOUTH TWICE A DAY 03/11/22   [name], MD  ?levETIRAcetam (KEPPRA) 1000 MG tablet Take 2 tablets (2,000 mg total) by mouth 2 (two) times daily. 01/25/22   Cameron Sprang, MD  ?metoprolol tartrate (LOPRESSOR) 25 MG tablet Take one-half tablet (12.5 mg total) by mouth 2 (two) times daily. 03/14/22 04/13/22  [name], MD  ?mirtazapine (REMERON SOL-TAB) 30 MG disintegrating tablet TAKE 1 TABLET BY MOUTH AT BEDTIME. 03/07/22   [name], MD  ?tamsulosin (FLOMAX) 0.4 MG CAPS capsule Take 0.4 mg by mouth daily. 09/21/21   [provider]  ?   ? ?Allergies    ?Patient has no known allergies.   ? ?Review of Systems   ?Review of Systems  ?Constitutional:  Negative for fever.  ?HENT:  Negative for sore throat.   ?Eyes:  Negative for visual disturbance.  ?Respiratory:  Negative for shortness of breath.   ?Cardiovascular:  Negative for chest pain.  ?Gastrointestinal:  Negative for abdominal pain.  ?Genitourinary:  Negative for dysuria.  ?Skin:  Negative for rash.  ?Neurological:  Positive for seizures.  ? ?Physical Exam ?Updated Vital Signs ?Ht '5\' 8"'$  (1.727 m)   Wt 81.6 kg   BMI 27.37 kg/m?  ?Physical Exam ?Vitals and nursing note reviewed.  ?Constitutional:   ?   General: He is not in acute distress. ?   Appearance: Normal appearance. He is well-developed.  ?HENT:  ?   Head: Normocephalic and atraumatic.  ?Eyes:  ?   Conjunctiva/sclera: Conjunctivae normal.  ?Cardiovascular:  ?   Rate and Rhythm: Normal rate and regular rhythm.  ?   Heart sounds: No murmur heard. ?Pulmonary:  ?   Effort: Pulmonary effort is normal. No respiratory  distress.  ?   Breath sounds: Normal breath sounds.  ?Abdominal:  ?   Palpations: Abdomen is soft.  ?   Tenderness: There is no abdominal tenderness. There is no guarding or rebound.  ?Musculoskeletal:     ?   General: No swelling. Normal range of motion.  ?   Cervical back: Neck supple.  ?Skin: ?   General: Skin is warm and dry.  ?   Capillary Refill: Capillary refill takes less than 2 seconds.  ?Neurological:  ?   General: No focal deficit present.  ?   Mental Status: He is alert.  ?   Cranial Nerves: No cranial nerve deficit.  ?   Sensory: No sensory deficit.  ?   Motor: No weakness.  ? ? ?ED Results / Procedures / Treatments   ?Labs ?(all labs ordered are listed, but only abnormal  results are displayed) ?Labs Reviewed  ?BASIC METABOLIC PANEL - Abnormal; Notable for the following components:  ?    Result Value  ? Sodium 132 (*)   ? Potassium 5.3 (*)   ? Glucose, Bld 103 (*)   ? All other components within normal limits  ?CBC WITH DIFFERENTIAL/PLATELET - Abnormal; Notable for the following components:  ? RBC 4.06 (*)   ? Hemoglobin 12.1 (*)   ? HCT 36.6 (*)   ? Platelets 137 (*)   ? All other components within normal limits  ?URINALYSIS, ROUTINE W REFLEX MICROSCOPIC - Abnormal; Notable for the following components:  ? APPearance HAZY (*)   ? All other components within normal limits  ?MAGNESIUM  ? ? ?EKG ?EKG Interpretation ? ?Date/Time:  Sunday March 24 2022 21:30:57 EDT ?Ventricular Rate:  85 ?PR Interval:  327 ?QRS Duration: 101 ?QT Interval:  418 ?QTC Calculation: 461 ?R Axis:   21 ?Text Interpretation: Sinus rhythm Prolonged PR interval Probable left atrial enlargement Abnormal R-wave progression, early transition Minimal ST elevation, anterior leads No significant change since prior 2/23 Confirmed by Aletta Edouard [phone]) on 03/24/2022 9:33:55 PM ? ?Radiology ?DG Chest Port 1 View ? ?Result Date: 03/24/2022 ?CLINICAL DATA:  Seizure EXAM: PORTABLE CHEST 1 VIEW COMPARISON:  01/20/2022, CT chest 11/28/2021 FINDINGS: Atelectasis at the left base. No pleural effusion or pneumothorax. Normal cardiac size. Aortic atherosclerosis. Right upper lobe pulmonary nodule less well visualized on the current exam IMPRESSION: 1. Atelectasis at the left base 2. Right upper lobe lung nodule is less well demonstrated on today's study Electronically Signed   By: Donavan Foil M.D.   On: 03/24/2022 21:41   ? ?Procedures ?Procedures  ? ? ?Medications Ordered in ED ?Medications  ?lacosamide (VIMPAT) 200 mg in sodium chloride 0.9 % 25 mL IVPB (0 mg Intravenous Stopped 03/25/22 0034)  ? ? ?ED Course/ Medical Decision Making/ A&P ?Clinical Course as of 03/25/22 0934  ?Nancy Fetter Mar 24, 2022  ?2137 Discussed with Dr. Rory Percy  neurology who recommends talking with him once the medical work-up is done and likely will need a second agent added. [MB]  ?2251 Potassium was mildly elevated at 5.3 but was hemolyzed and patient otherwise has normal renal function. [MB]  ?  ?Clinical Course User Index ?[MB] Hayden Rasmussen, MD  ? ?                        ?Medical Decision Making ?Amount and/or Complexity of Data Reviewed ?Labs: ordered. ?Radiology: ordered. ? ?Risk ?Prescription drug management. ? ?This patient complains of focal seizure; this involves an extensive  number of treatment ?Options and is a complaint that carries with it a high risk of complications and ?morbidity. The differential includes breakthrough seizure, infection, metabolic derangement, medication compliance ? ?I ordered, reviewed and interpreted labs, which included CBC with normal white count and low stable from priors, chemistries with mildly low sodium, elevated potassium level was hemolyzed, urinalysis without signs of infection ?I ordered medication IV Vimpat and reviewed PMP when indicated. ?I ordered imaging studies which included chest x-ray and I independently ?   visualized and interpreted imaging which showed no acute infiltrate ?Additional history obtained from EMS and patient's daughter ?Previous records obtained and reviewed in epic including recent ED visit in February ?I consulted Dr. Rory Percy neurology and discussed lab and imaging findings and discussed disposition.  ?Cardiac monitoring reviewed, normal sinus rhythm ?Social determinants considered, no significant barriers ?Critical Interventions: None ? ?After the interventions stated above, I reevaluated the patient and found patient to be asymptomatic and seizure-free ?Admission and further testing considered, no indications for admission at this time.  The patient and family member in agreement for discharge and close follow-up with his outpatient treating providers.  Return instructions  discussed. ? ? ? ? ? ? ? ? ? ?Final Clinical Impression(s) / ED Diagnoses ?Final diagnoses:  ?Seizure ([place])  ? ? ?Rx / DC Orders ?ED Discharge Orders   ? ?      Ordered  ?  lacosamide (VIMPAT) 50 MG TABS tablet  2 times daily

## 2022-03-25 ENCOUNTER — Telehealth: Payer: Self-pay | Admitting: Neurology

## 2022-03-25 NOTE — ED Notes (Signed)
Pt A&Ox4 at d/c. D/c instructions reviewed with the patient and his daughter who verbalized understanding of d/c instructions, prescription and follow up care. ?

## 2022-03-25 NOTE — Telephone Encounter (Signed)
Patient's daughter Lanney Gins called to report patient had a seizure last night and was advised to go to the ED. ? ?She said he want to the ED and was prescribed lacosamide 50 MG as well. ? ?She said she has some follow up questions for Dr. Delice Lesch about his medications. ? ?She said he has been taking it as instructed so she's not sure why he had another seizure. ? ?Tamie is aware Dr. Eustace Pen is on vacation at this time. ?

## 2022-03-25 NOTE — Telephone Encounter (Signed)
Patient advised will follow up as scheduled and resume medication as hospital instructed for discharge  ?

## 2022-04-02 ENCOUNTER — Encounter: Payer: Self-pay | Admitting: Neurology

## 2022-04-02 ENCOUNTER — Ambulatory Visit (INDEPENDENT_AMBULATORY_CARE_PROVIDER_SITE_OTHER): Payer: Medicare Other | Admitting: Neurology

## 2022-04-02 VITALS — BP 139/74 | HR 97 | Ht 68.0 in | Wt 198.0 lb

## 2022-04-02 DIAGNOSIS — G40201 Localization-related (focal) (partial) symptomatic epilepsy and epileptic syndromes with complex partial seizures, not intractable, with status epilepticus: Secondary | ICD-10-CM

## 2022-04-02 MED ORDER — VALTOCO 15 MG DOSE 7.5 MG/0.1ML NA LQPK: NASAL | 5 refills | Status: AC

## 2022-04-02 MED ORDER — LEVETIRACETAM 1000 MG PO TABS
2000.0000 mg | ORAL_TABLET | Freq: Two times a day (BID) | ORAL | 6 refills | Status: DC
Start: 2022-04-02 — End: 2022-07-22

## 2022-04-02 MED ORDER — LACOSAMIDE 50 MG PO TABS
50.0000 mg | ORAL_TABLET | Freq: Two times a day (BID) | ORAL | 5 refills | Status: DC
Start: 2022-04-02 — End: 2022-05-14

## 2022-04-02 NOTE — Patient Instructions (Signed)
Continue Lacosamide '50mg'$  twice a day and Keppra (Levetiracetam) '1000mg'$ : take 2 tablets twice a day ? ?2. A prescription for Valtoco nasal spray for seizure rescue has been sent. Use as needed for seizure, may take second dose if needed after 4 hours. ? ?3. Follow-up in 4 months, call for any changes. ? ?Seizure Precautions: ?1. If medication has been prescribed for you to prevent seizures, take it exactly as directed.  Do not stop taking the medicine without talking to your doctor first, even if you have not had a seizure in a long time.  ? ?2. Avoid activities in which a seizure would cause danger to yourself or to others.  Don't operate dangerous machinery, swim alone, or climb in high or dangerous places, such as on ladders, roofs, or girders.  Do not drive unless your doctor says you may. ? ?3. If you have any warning that you may have a seizure, lay down in a safe place where you can't hurt yourself.   ? ?4.  No driving for 6 months from last seizure, as per [facility]   Please refer to the following link on the [place] website for more information: http://www.epilepsyfoundation.org/answerplace/Social/driving/drivingu.cfm  ? ?5.  Maintain good sleep hygiene. Avoid alcohol. ? ?6.  Contact your doctor if you have any problems that may be related to the medicine you are taking. ? ?7.  Call 911 and bring the patient back to the ED if: ?      ? A.  The seizure lasts longer than 5 minutes.      ? B.  The patient doesn't awaken shortly after the seizure ? C.  The patient has new problems such as difficulty seeing, speaking or moving ? D.  The patient was injured during the seizure ? E.  The patient has a temperature over 102 F (39C) ? F.  The patient vomited and now is having trouble breathing ?      ? ?

## 2022-04-02 NOTE — Progress Notes (Signed)
? ?NEUROLOGY FOLLOW UP OFFICE NOTE ? ?[name] ?[id number] ?January 08, 1936 ? ?HISTORY OF PRESENT ILLNESS: ?I had the pleasure of seeing Nicco Reaume in follow-up in the neurology clinic on 04/02/2022.  He is accompanied by his daughter Ellison Hughs who helps supplement the history today. The patient was last seen 2 months ago and presents for an earlier visit after another breakthrough left-sided focal seizure on 03/24/22 that again lasted for 30 minutes. He was given Versed by EMS with resolution of seizure. Bloodwork showed Na 132, K 5.3 (hemolyzed), negative UA. Lacosamide '50mg'$  BID was added to his home dose of Levetiracetam '2000mg'$  BID. He states he was awake and alert the entire time when his left arm was jerking. He had post-ictal Todd's paralysis after. He denies any headaches, dizziness, vision changes, no falls since October/November. No side effects on medications. Mood is good.  ? ? ?History on Initial Assessment 09/20/2021: This is an 86 year old right-handed man with a history of hypertension, hyperlipidemia, PE on Eliquis, prostate cancer, prior R MCA stroke, presenting to establish care for new onset seizures. Records from his prior neurologist Dr. Leonie Man and hospital records were reviewed. His last visit with [facility] was in 11/2019. He was admitted to [facility] on 08/13/21, He recalls feeling dizzy, "something just was not right, he could talk and told his wife to call EMS and words were slurred. EMS notes indicated left-sided weakness with note of left facial and arm twitching on EMS arrival.  Head CT no acute changes, he was started on Dilantin and given repeated doses of Ativan due to continued focal seizure activity. As seizure activity increased, he was unable to move left arm at all, able to lift right arm without drift. EEG showed LPDs on the right hemisphere, maximal right temporal region with some evolution in frequency but no definite seizure. Background showed diffuse slowing and lateralized right hemisphere  slowing. MRI brain no acute changes, right MCA encephalomalacia with chronic blood products. Follow-up EEG showed right temporal sharp waves. He had an MRI brain on 08/22/21 for vision changes, MRI brain no change from prior. He had an extensive DVT and known right lower lobe lung mass which he declined further workup on.  ? ?He lives with his wife, his daughter has been staying with them since hospitalization, managing medications. She denies any further seizures since 08/13/21, no staring/unresponsive episodes. He feels his left leg is 90% better. He has glaucoma with blurred vision and bilateral loss of peripheral vision. He denies any olfactory/gustatory hallucinations, deja vu, rising epigastric sensation, focal tingling/weakness, myoclonic jerks. He denies any headaches, dizziness, neck/back pain, bowel/bladder dysfunction. He has been using a cane since hospital discharge, no falls. Sleep is good. No side effects on Levetiracetam '1000mg'$  BID. Mood is good, however his daughter reports he is "moody."  ? ?Epilepsy Risk Factors:  right MCA encephalomalacia. Otherwise he had a normal birth and early development.  There is no history of febrile convulsions, CNS infections such as meningitis/encephalitis, significant traumatic brain injury, neurosurgical procedures, or family history of seizures. ? ? ?PAST MEDICAL HISTORY: ?Past Medical History:  ?Diagnosis Date  ? Hyperlipidemia   ? Hypertension   ? Seizures ([place])   ? Stroke [facility])   ? ? ?MEDICATIONS: ?Current Outpatient Medications on File Prior to Visit  ?Medication Sig Dispense Refill  ? allopurinol (ZYLOPRIM) 300 MG tablet Take 300 mg by mouth daily as needed.    ? amLODipine (NORVASC) 10 MG tablet Take 1 tablet (10 mg total) by  mouth daily. 90 tablet 1  ? atorvastatin (LIPITOR) 40 MG tablet TAKE 1 TABLET (40 MG TOTAL) BY MOUTH DAILY AT 6 PM. 90 tablet 0  ? ELIQUIS 5 MG TABS tablet TAKE 1 TABLET BY MOUTH TWICE A DAY 60 tablet 3  ? lacosamide (VIMPAT) 50 MG TABS  tablet Take 1 tablet (50 mg total) by mouth 2 (two) times daily. 60 tablet 2  ? levETIRAcetam (KEPPRA) 1000 MG tablet Take 2 tablets (2,000 mg total) by mouth 2 (two) times daily. 120 tablet 6  ? metoprolol tartrate (LOPRESSOR) 25 MG tablet Take one-half tablet (12.5 mg total) by mouth 2 (two) times daily. 180 tablet 1  ? mirtazapine (REMERON SOL-TAB) 30 MG disintegrating tablet TAKE 1 TABLET BY MOUTH AT BEDTIME. 90 tablet 4  ? tamsulosin (FLOMAX) 0.4 MG CAPS capsule Take 0.4 mg by mouth daily.    ? ?No current facility-administered medications on file prior to visit.  ? ? ?ALLERGIES: ?No Known Allergies ? ?FAMILY HISTORY: ?Family History  ?Problem Relation Age of Onset  ? COPD Mother   ? Heart attack Father   ? ? ?SOCIAL HISTORY: ?Social History  ? ?Socioeconomic History  ? Marital status: Married  ?  Spouse name: [name]  ? Number of children: 4  ? Years of education: 10th grade  ? Highest education level: 9th grade  ?Occupational History  ? Occupation: retired  ?Tobacco Use  ? Smoking status: Former  ?  Packs/day: 1.00  ?  Years: 50.00  ?  Pack years: 50.00  ?  Types: Cigarettes  ?  Quit date: 12/15/2009  ?  Years since quitting: 12.3  ? Smokeless tobacco: Never  ?Vaping Use  ? Vaping Use: Never used  ?Substance and Sexual Activity  ? Alcohol use: No  ?  Alcohol/week: 0.0 standard drinks  ? Drug use: No  ? Sexual activity: Not on file  ?Other Topics Concern  ? Not on file  ?Social History Narrative  ? ** Merged History Encounter **  ?    ? Right handed   ? ?Social Determinants of Health  ? ?Financial Resource Strain: High Risk  ? Difficulty of Paying Living Expenses: Hard  ?Food Insecurity: No Food Insecurity  ? Worried About Charity fundraiser in the Last Year: Never true  ? Ran Out of Food in the Last Year: Never true  ?Transportation Needs: No Transportation Needs  ? Lack of Transportation (Medical): No  ? Lack of Transportation (Non-Medical): No  ?Physical Activity: Insufficiently Active  ? Days of Exercise  per Week: 7 days  ? Minutes of Exercise per Session: 20 min  ?Stress: No Stress Concern Present  ? Feeling of Stress : Not at all  ?Social Connections: Moderately Integrated  ? Frequency of Communication with Friends and Family: More than three times a week  ? Frequency of Social Gatherings with Friends and Family: More than three times a week  ? Attends Religious Services: More than 4 times per year  ? Active Member of Clubs or Organizations: No  ? Attends Archivist Meetings: Never  ? Marital Status: Married  ?Intimate Partner Violence: Not on file  ? ? ? ?PHYSICAL EXAM: ?Vitals:  ? 04/02/22 1443  ?BP: 139/74  ?Pulse: 97  ?SpO2: 98%  ? ?General: No acute distress ?Head:  Normocephalic/atraumatic ?Skin/Extremities: No rash, no edema ?Neurological Exam: alert and awake. No aphasia or dysarthria. Fund of knowledge is appropriate.  Attention and concentration are normal.   Cranial nerves: Pupils equal,  round. Extraocular movements intact except for ?mild difficulty with upgaze bilaterally. No nystagmus. He appears to have difficulty with peripheral vision on both sides. No facial asymmetry.  Motor: Bulk and tone normal, muscle strength 5/5 throughout with no pronator drift.   Finger to nose testing intact.  Gait slightly wide-based, no ataxia.  ? ? ?IMPRESSION: ?This is an 86 yo RH man with a history of hypertension, hyperlipidemia, PE on Eliquis, prostate cancer, prior R MCA stroke, who had new onset focal status epilepticus last 08/13/21 with left face/arm twitching.  EEG showed LPDs arising from the right temporal region/right hemisphere. MRI no acute changes, right MCA encephalomalacia. He had another prolonged focal seizure without impaired awareness in 01/2022 and most recently on 03/24/22. Lacosamide '50mg'$  BID has been added to Levetiracetam '2000mg'$  BID, refills sent. We discussed diagnosis, prognosis. He was given a prescription for prn Valtoco nasal spray for seizure lasting longer than 5 minutes, side  effects discussed. He is aware of [place] driving laws to stop driving after a seizure, he would like to resume driving however he appears to have visual deficits as well, would recommend eye exam if he would [facility]

## 2022-04-08 ENCOUNTER — Ambulatory Visit (INDEPENDENT_AMBULATORY_CARE_PROVIDER_SITE_OTHER): Payer: Medicare Other

## 2022-04-08 VITALS — Wt 198.0 lb

## 2022-04-08 DIAGNOSIS — Z0000 Encounter for general adult medical examination without abnormal findings: Secondary | ICD-10-CM | POA: Diagnosis not present

## 2022-04-08 NOTE — Progress Notes (Addendum)
Virtual Visit via Telephone Note  I connected with  [name] on 04/08/22 at 12:45 PM EDT by telephone and verified that I am speaking with the correct person using two identifiers.  Location: Patient: home Provider: [facility] Persons participating in the virtual visit: [place]   I discussed the limitations, risks, security and privacy concerns of performing an evaluation and management service by telephone and the availability of in person appointments. The patient expressed understanding and agreed to proceed.  Interactive audio and video telecommunications were attempted between this nurse and patient, however failed, due to patient having technical difficulties OR patient did not have access to video capability.  We continued and completed visit with audio only.  Some vital signs may be absent or patient reported.   Dionisio David, LPN  Subjective:   Biran Mayberry. is a 86 y.o. male who presents for Medicare Annual/Subsequent preventive examination.  Review of Systems           Objective:    Today's Vitals   04/08/22 1244  PainSc: 0-No pain   There is no height or weight on file to calculate BMI.     04/02/2022    2:44 PM 01/22/2022    8:11 AM 01/20/2022    1:41 PM 10/06/2021    6:32 PM 09/20/2021    8:57 AM 08/25/2021    5:56 PM 08/13/2021    5:22 PM  Advanced Directives  Does Patient Have a Medical Advance Directive? Yes Yes No  Yes No No  Type of Paramedic of [place];Living will;Out of facility DNR (pink MOST or yellow form)    [place];Living will;Out of facility DNR (pink MOST or yellow form)    Would patient like information on creating a medical advance directive?      No - Patient declined No - Patient declined     Information is confidential and restricted. Go to Review Flowsheets to unlock data.    Current Medications (verified) Outpatient Encounter Medications as of 04/08/2022  Medication  Sig   allopurinol (ZYLOPRIM) 300 MG tablet Take 300 mg by mouth daily as needed.   amLODipine (NORVASC) 10 MG tablet Take 1 tablet (10 mg total) by mouth daily.   atorvastatin (LIPITOR) 40 MG tablet TAKE 1 TABLET (40 MG TOTAL) BY MOUTH DAILY AT 6 PM.   bicalutamide (CASODEX) 50 MG tablet Take 50 mg by mouth daily.   diazePAM, 15 MG Dose, (VALTOCO 15 MG DOSE) 2 x 7.5 MG/0.1ML LQPK Spray in each nostril as needed for seizure. May use second dose after 4 hours if needed.   ELIQUIS 5 MG TABS tablet TAKE 1 TABLET BY MOUTH TWICE A DAY   fosfomycin (MONUROL) 3 g PACK SMARTSIG:3 Gram(s) By Mouth Once   lacosamide (VIMPAT) 50 MG TABS tablet Take 1 tablet (50 mg total) by mouth 2 (two) times daily.   levETIRAcetam (KEPPRA) 1000 MG tablet Take 2 tablets (2,000 mg total) by mouth 2 (two) times daily.   metoprolol tartrate (LOPRESSOR) 25 MG tablet Take one-half tablet (12.5 mg total) by mouth 2 (two) times daily.   mirtazapine (REMERON SOL-TAB) 30 MG disintegrating tablet TAKE 1 TABLET BY MOUTH AT BEDTIME.   tamsulosin (FLOMAX) 0.4 MG CAPS capsule Take 0.4 mg by mouth daily.   No facility-administered encounter medications on file as of 04/08/2022.    Allergies (verified) Patient has no known allergies.   History: Past Medical History:  Diagnosis Date   Hyperlipidemia  Hypertension    Seizures ([place])    Stroke [facility])    Past Surgical History:  Procedure Laterality Date   LIPOMA EXCISION     neck   PROSTATECTOMY     due to prostate cancer   Family History  Problem Relation Age of Onset   COPD Mother    Heart attack Father    Social History   Socioeconomic History   Marital status: Married    Spouse name: [name]   Number of children: 4   Years of education: 10th grade   Highest education level: 9th grade  Occupational History   Occupation: retired  Tobacco Use   Smoking status: Former    Packs/day: 1.00    Years: 50.00    Pack years: 50.00    Types: Cigarettes    Quit date:  12/15/2009    Years since quitting: 12.3   Smokeless tobacco: Never  Vaping Use   Vaping Use: Never used  Substance and Sexual Activity   Alcohol use: No    Alcohol/week: 0.0 standard drinks   Drug use: No   Sexual activity: Not on file  Other Topics Concern   Not on file  Social History Narrative   ** Merged History Encounter **       Right handed    Social Determinants of Health   Financial Resource Strain: High Risk   Difficulty of Paying Living Expenses: Hard  Food Insecurity: No Food Insecurity   Worried About Running Out of Food in the Last Year: Never true   Ran Out of Food in the Last Year: Never true  Transportation Needs: No Transportation Needs   Lack of Transportation (Medical): No   Lack of Transportation (Non-Medical): No  Physical Activity: Insufficiently Active   Days of Exercise per Week: 7 days   Minutes of Exercise per Session: 20 min  Stress: No Stress Concern Present   Feeling of Stress : Not at all  Social Connections: Moderately Integrated   Frequency of Communication with Friends and Family: More than three times a week   Frequency of Social Gatherings with Friends and Family: More than three times a week   Attends Religious Services: More than 4 times per year   Active Member of [employer] or Organizations: No   Attends Music therapist: Never   Marital Status: Married    Tobacco Counseling Counseling given: Not Answered   Clinical Intake:  Pre-visit preparation completed: Yes  Pain : No/denies pain Pain Score: 0-No pain     Nutritional Risks: None Diabetes: No  How often do you need to have someone help you when you read instructions, pamphlets, or other written materials from your doctor or pharmacy?: 1 - Never  Diabetic?no  Interpreter Needed?: No  Information entered by :: Kirke Shaggy, LPN   Activities of Daily Living    02/12/2022    1:25 PM 08/26/2021    2:38 PM  In your present state of health, do you have  any difficulty performing the following activities:  Hearing? 1 0  Vision? 1 1  Difficulty concentrating or making decisions? 0 0  Walking or climbing stairs? 1 1  Dressing or bathing? 0 0  Doing errands, shopping? 1 0    Patient Care Team: [name], MD as PCP - General (Family Medicine) Jerline Pain, MD as PCP - Cardiology (Cardiology) [place], [place] as Consulting Physician (Optometry) Bernardo Heater, Ronda Fairly, MD as Consulting Physician (Urology) Germaine Pomfret, [facility] (  Pharmacist) Cameron Sprang, MD as Consulting Physician (Neurology) [name], MD (Family Medicine)  Indicate any recent Medical Services you may have received from other than [facility] providers in the past year (date may be approximate).     Assessment:   This is a routine wellness examination for [place].  Hearing/Vision screen No results found.  Dietary issues and exercise activities discussed:     Goals Addressed   None    Depression Screen    02/12/2022    1:25 PM 09/18/2021   10:25 AM 06/20/2021    2:25 PM 04/17/2020    9:39 AM 08/04/2019    8:43 AM 04/14/2019    2:44 PM 04/07/2018    9:32 AM  PHQ 2/9 Scores  PHQ - 2 Score 2 0 0 0 0 0 0  PHQ- 9 Score 4     0 0    Fall Risk    04/02/2022    2:44 PM 02/12/2022    1:24 PM 01/22/2022    8:11 AM 09/20/2021    8:57 AM 04/17/2020    9:42 AM  Fall Risk   Falls in the past year? 0 1 0 0 0  Number falls in past yr: 0 1 0 0 0  Injury with Fall? 0 1 0 0 0    FALL RISK PREVENTION PERTAINING TO THE HOME:  Any stairs in or around the home? No  If so, are there any without handrails? No  Home free of loose throw rugs in walkways, pet beds, electrical cords, etc? Yes  Adequate lighting in your home to reduce risk of falls? Yes   ASSISTIVE DEVICES UTILIZED TO PREVENT FALLS:  Life alert? No  Use of a cane, walker or w/c? Yes  Grab bars in the bathroom? Yes  Shower chair or bench in shower? Yes  Elevated toilet seat or a  handicapped toilet? No   Cognitive Function:       04/17/2020    9:46 AM 04/14/2019    2:49 PM  6CIT Screen  What Year? 0 points 0 points  What month? 0 points 0 points  What time? 0 points 0 points  Count back from 20 0 points 0 points  Months in reverse 2 points 0 points  Repeat phrase 6 points 4 points  Total Score 8 points 4 points    Immunizations Immunization History  Administered Date(s) Administered   Influenza-Unspecified 10/16/2018   PFIZER(Purple Top)SARS-COV-2 Vaccination 12/18/2020   Pneumococcal Conjugate-13 10/04/2014   Pneumococcal Polysaccharide-23 01/11/2013   Zoster, Live 11/13/2012    TDAP status: Due, Education has been provided regarding the importance of this vaccine. Advised may receive this vaccine at local pharmacy or Health Dept. Aware to provide a copy of the vaccination record if obtained from local pharmacy or Health Dept. Verbalized acceptance and understanding.  Flu Vaccine status: Up to date  Pneumococcal vaccine status: Up to date  Covid-19 vaccine status: Completed vaccines  Qualifies for Shingles Vaccine? Yes   Zostavax completed No - YES  Shingrix Completed?: No.    Education has been provided regarding the importance of this vaccine. Patient has been advised to call insurance company to determine out of pocket expense if they have not yet received this vaccine. Advised may also receive vaccine at local pharmacy or Health Dept. Verbalized acceptance and understanding.  Screening Tests Health Maintenance  Topic Date Due   TETANUS/TDAP  Never done   Zoster Vaccines- Shingrix (1 of 2) Never done  COVID-19 Vaccine (2 - Pfizer risk series) 01/08/2021   INFLUENZA VACCINE  07/16/2022   Pneumonia Vaccine 5+ Years old  Completed   HPV VACCINES  Aged Out    Health Maintenance  Health Maintenance Due  Topic Date Due   TETANUS/TDAP  Never done   Zoster Vaccines- Shingrix (1 of 2) Never done   COVID-19 Vaccine (2 - Pfizer risk series)  01/08/2021    Colorectal cancer screening: No longer required.   Lung Cancer Screening: (Low Dose CT Chest recommended if Age 38-80 years, 30 pack-year currently smoking OR have quit w/in 15years.) does not qualify.    Additional Screening: declined referral  Hepatitis C Screening: does not qualify; Completed no  Vision Screening: Recommended annual ophthalmology exams for early detection of glaucoma and other disorders of the eye. Is the patient up to date with their annual eye exam?  Yes  Who is the provider or what is the name of the office in which the patient attends annual eye exams? [facility] If pt is not established with a provider, would they like to be referred to a provider to establish care? No .   Dental Screening: Recommended annual dental exams for proper oral hygiene  Community Resource Referral / Chronic Care Management: CRR required this visit?  No   CCM required this visit?  No      Plan:     I have personally reviewed and noted the following in the patient's chart:   Medical and social history Use of alcohol, tobacco or illicit drugs  Current medications and supplements including opioid prescriptions. Patient is not currently taking opioid prescriptions. Functional ability and status Nutritional status Physical activity Advanced directives List of other physicians Hospitalizations, surgeries, and ER visits in previous 12 months Vitals Screenings to include cognitive, depression, and falls Referrals and appointments  In addition, I have reviewed and discussed with patient certain preventive protocols, quality metrics, and best practice recommendations. A written personalized care plan for preventive services as well as general preventive health recommendations were provided to patient.     Dionisio David, LPN   03/14/5187   Nurse Notes: none

## 2022-04-08 NOTE — Patient Instructions (Addendum)
Mr. Amsden , ?Thank you for taking time to come for your Medicare Wellness Visit. I appreciate your ongoing commitment to your health goals. Please review the following plan we discussed and let me know if I can assist you in the future.  ? ?Screening recommendations/referrals: ?Colonoscopy: aged out ?Recommended yearly ophthalmology/optometry visit for glaucoma screening and checkup ?Recommended yearly dental visit for hygiene and checkup ? ?Vaccinations: ?Influenza vaccine: n/d- states had at pharmacy ?Pneumococcal vaccine: 10/04/14 ?Tdap vaccine: n/d ?Shingles vaccine: Zostavax 11/13/12  ?Covid-19: 12/18/20 ? ?Advanced directives: yes ? ?Conditions/risks identified: done ? ?Next appointment: Follow up in one year for your annual wellness visit. 04/10/23 @ 11am by phone ? ?Preventive Care 35 Years and Older, Male ?Preventive care refers to lifestyle choices and visits with your health care provider that can promote health and wellness. ?What does preventive care include? ?A yearly physical exam. This is also called an annual well check. ?Dental exams once or twice a year. ?Routine eye exams. Ask your health care provider how often you should have your eyes checked. ?Personal lifestyle choices, including: ?Daily care of your teeth and gums. ?Regular physical activity. ?Eating a healthy diet. ?Avoiding tobacco and drug use. ?Limiting alcohol use. ?Practicing safe sex. ?Taking low doses of aspirin every day. ?Taking vitamin and mineral supplements as recommended by your health care provider. ?What happens during an annual well check? ?The services and screenings done by your health care provider during your annual well check will depend on your age, overall health, lifestyle risk factors, and family history of disease. ?Counseling  ?Your health care provider may ask you questions about your: ?Alcohol use. ?Tobacco use. ?Drug use. ?Emotional well-being. ?Home and relationship well-being. ?Sexual activity. ?Eating  habits. ?History of falls. ?Memory and ability to understand (cognition). ?Work and work Statistician. ?Screening  ?You may have the following tests or measurements: ?Height, weight, and BMI. ?Blood pressure. ?Lipid and cholesterol levels. These may be checked every 5 years, or more frequently if you are over 62 years old. ?Skin check. ?Lung cancer screening. You may have this screening every year starting at age 84 if you have a 30-pack-year history of smoking and currently smoke or have quit within the past 15 years. ?Fecal occult blood test (FOBT) of the stool. You may have this test every year starting at age 84. ?Flexible sigmoidoscopy or colonoscopy. You may have a sigmoidoscopy every 5 years or a colonoscopy every 10 years starting at age 84. ?Prostate cancer screening. Recommendations will vary depending on your family history and other risks. ?Hepatitis C blood test. ?Hepatitis B blood test. ?Sexually transmitted disease (STD) testing. ?Diabetes screening. This is done by checking your blood sugar (glucose) after you have not eaten for a while (fasting). You may have this done every 1-3 years. ?Abdominal aortic aneurysm (AAA) screening. You may need this if you are a current or former smoker. ?Osteoporosis. You may be screened starting at age 84 if you are at high risk. ?Talk with your health care provider about your test results, treatment options, and if necessary, the need for more tests. ?Vaccines  ?Your health care provider may recommend certain vaccines, such as: ?Influenza vaccine. This is recommended every year. ?Tetanus, diphtheria, and acellular pertussis (Tdap, Td) vaccine. You may need a Td booster every 10 years. ?Zoster vaccine. You may need this after age 84. ?Pneumococcal 13-valent conjugate (PCV13) vaccine. One dose is recommended after age 43. ?Pneumococcal polysaccharide (PPSV23) vaccine. One dose is recommended after age 40. ?Talk to your  health care provider about which screenings and  vaccines you need and how often you need them. ?This information is not intended to replace advice given to you by your health care provider. Make sure you discuss any questions you have with your health care provider. ?Document Released: 12/29/2015 Document Revised: 08/21/2016 Document Reviewed: 10/03/2015 ?Elsevier Interactive Patient Education ? 2017 [place]. ? ?Fall Prevention in the Home ?Falls can cause injuries. They can happen to people of all ages. There are many things you can do to make your home safe and to help prevent falls. ?What can I do on the outside of my home? ?Regularly fix the edges of walkways and driveways and fix any cracks. ?Remove anything that might make you trip as you walk through a door, such as a raised step or threshold. ?Trim any bushes or trees on the path to your home. ?Use bright outdoor lighting. ?Clear any walking paths of anything that might make someone trip, such as rocks or tools. ?Regularly check to see if handrails are loose or broken. Make sure that both sides of any steps have handrails. ?Any raised decks and porches should have guardrails on the edges. ?Have any leaves, snow, or ice cleared regularly. ?Use sand or salt on walking paths during winter. ?Clean up any spills in your garage right away. This includes oil or grease spills. ?What can I do in the bathroom? ?Use night lights. ?Install grab bars by the toilet and in the tub and shower. Do not use towel bars as grab bars. ?Use non-skid mats or decals in the tub or shower. ?If you need to sit down in the shower, use a plastic, non-slip stool. ?Keep the floor dry. Clean up any water that spills on the floor as soon as it happens. ?Remove soap buildup in the tub or shower regularly. ?Attach bath mats securely with double-sided non-slip rug tape. ?Do not have throw rugs and other things on the floor that can make you trip. ?What can I do in the bedroom? ?Use night lights. ?Make sure that you have a light by your  bed that is easy to reach. ?Do not use any sheets or blankets that are too big for your bed. They should not hang down onto the floor. ?Have a firm chair that has side arms. You can use this for support while you get dressed. ?Do not have throw rugs and other things on the floor that can make you trip. ?What can I do in the kitchen? ?Clean up any spills right away. ?Avoid walking on wet floors. ?Keep items that you use a lot in easy-to-reach places. ?If you need to reach something above you, use a strong step stool that has a grab bar. ?Keep electrical cords out of the way. ?Do not use floor polish or wax that makes floors slippery. If you must use wax, use non-skid floor wax. ?Do not have throw rugs and other things on the floor that can make you trip. ?What can I do with my stairs? ?Do not leave any items on the stairs. ?Make sure that there are handrails on both sides of the stairs and use them. Fix handrails that are broken or loose. Make sure that handrails are as long as the stairways. ?Check any carpeting to make sure that it is firmly attached to the stairs. Fix any carpet that is loose or worn. ?Avoid having throw rugs at the top or bottom of the stairs. If you do have throw rugs, attach them  to the floor with carpet tape. ?Make sure that you have a light switch at the top of the stairs and the bottom of the stairs. If you do not have them, ask someone to add them for you. ?What else can I do to help prevent falls? ?Wear shoes that: ?Do not have high heels. ?Have rubber bottoms. ?Are comfortable and fit you well. ?Are closed at the toe. Do not wear sandals. ?If you use a stepladder: ?Make sure that it is fully opened. Do not climb a closed stepladder. ?Make sure that both sides of the stepladder are locked into place. ?Ask someone to hold it for you, if possible. ?Clearly mark and make sure that you can see: ?Any grab bars or handrails. ?First and last steps. ?Where the edge of each step is. ?Use tools that  help you move around (mobility aids) if they are needed. These include: ?Canes. ?Walkers. ?Scooters. ?Crutches. ?Turn on the lights when you go into a dark area. Replace any light bulbs as soon as they burn ou

## 2022-04-18 ENCOUNTER — Ambulatory Visit (INDEPENDENT_AMBULATORY_CARE_PROVIDER_SITE_OTHER): Payer: Medicare Other | Admitting: Family Medicine

## 2022-04-18 VITALS — BP 147/80 | HR 106 | Temp 98.4°F | Wt 201.0 lb

## 2022-04-18 DIAGNOSIS — Z87891 Personal history of nicotine dependence: Secondary | ICD-10-CM

## 2022-04-18 DIAGNOSIS — G8194 Hemiplegia, unspecified affecting left nondominant side: Secondary | ICD-10-CM

## 2022-04-18 DIAGNOSIS — G40909 Epilepsy, unspecified, not intractable, without status epilepticus: Secondary | ICD-10-CM | POA: Diagnosis not present

## 2022-04-18 DIAGNOSIS — E7800 Pure hypercholesterolemia, unspecified: Secondary | ICD-10-CM | POA: Diagnosis not present

## 2022-04-18 DIAGNOSIS — I63411 Cerebral infarction due to embolism of right middle cerebral artery: Secondary | ICD-10-CM

## 2022-04-18 DIAGNOSIS — R918 Other nonspecific abnormal finding of lung field: Secondary | ICD-10-CM

## 2022-04-18 DIAGNOSIS — R930 Abnormal findings on diagnostic imaging of skull and head, not elsewhere classified: Secondary | ICD-10-CM

## 2022-04-18 DIAGNOSIS — I10 Essential (primary) hypertension: Secondary | ICD-10-CM | POA: Diagnosis not present

## 2022-04-18 MED ORDER — METOPROLOL TARTRATE 25 MG PO TABS: 25.0000 mg | ORAL_TABLET | Freq: Two times a day (BID) | ORAL | 3 refills | Status: DC

## 2022-04-18 NOTE — Progress Notes (Signed)
?  ? ? ?Established patient visit ? ? ?Patient: Travis Salazar.   DOB: 05-14-1936   86 y.o. Male  MRN: [id number] ?Visit Date: 04/18/2022 ? ?Today's healthcare provider: [name], MD  ? ?No chief complaint on file. ? ?Subjective  ?  ?HPI  ?Patient is an 86 year old male who presents for of weight loss.  He was last seen in February and his weight was 180.  At that time he was given Remeron 30 mg daily.  He has since then gone up 21 pounds with today's weight at 201.  He states he has good appetite now.  ?His daughter is with him and family is very supportive.  His wife died late last year.  Misses her but is coping okay. ?He has decided not to pursue biopsy of a lung mass which is likely cancer. ?He has had no further seizures since last time he was in the hospital. ?Medications: ?Outpatient Medications Prior to Visit  ?Medication Sig  ? allopurinol (ZYLOPRIM) 300 MG tablet Take 300 mg by mouth daily as needed.  ? amLODipine (NORVASC) 10 MG tablet Take 1 tablet (10 mg total) by mouth daily.  ? atorvastatin (LIPITOR) 40 MG tablet TAKE 1 TABLET (40 MG TOTAL) BY MOUTH DAILY AT 6 PM.  ? bicalutamide (CASODEX) 50 MG tablet Take 50 mg by mouth daily.  ? diazePAM, 15 MG Dose, (VALTOCO 15 MG DOSE) 2 x 7.5 MG/0.1ML LQPK Spray in each nostril as needed for seizure. May use second dose after 4 hours if needed.  ? ELIQUIS 5 MG TABS tablet TAKE 1 TABLET BY MOUTH TWICE A DAY  ? lacosamide (VIMPAT) 50 MG TABS tablet Take 1 tablet (50 mg total) by mouth 2 (two) times daily.  ? levETIRAcetam (KEPPRA) 1000 MG tablet Take 2 tablets (2,000 mg total) by mouth 2 (two) times daily.  ? mirtazapine (REMERON SOL-TAB) 30 MG disintegrating tablet TAKE 1 TABLET BY MOUTH AT BEDTIME.  ? tamsulosin (FLOMAX) 0.4 MG CAPS capsule Take 0.4 mg by mouth daily.  ? fosfomycin (MONUROL) 3 g PACK SMARTSIG:3 Gram(s) By Mouth Once  ? metoprolol tartrate (LOPRESSOR) 25 MG tablet Take one-half tablet (12.5 mg total) by mouth 2 (two) times daily.  ? ?No  facility-administered medications prior to visit.  ? ? ?Review of Systems  ?Respiratory:  Negative for cough, shortness of breath and wheezing.   ?Cardiovascular:  Negative for chest pain.  ? ?Last lipids ?Lab Results  ?Component Value Date  ? CHOL 187 08/29/2020  ? HDL 40 08/29/2020  ? LDLCALC 125 (H) 08/29/2020  ? TRIG 89 08/14/2021  ? CHOLHDL 4.7 08/29/2020  ? ?  ?  Objective  ?  ?BP (!) 147/80 (BP Location: Left Arm, Patient Position: Sitting, Cuff Size: Normal)   Pulse (!) 106   Temp 98.4 ?F (36.9 ?C) (Oral)   Wt 201 lb (91.2 kg)   SpO2 99%   BMI 30.56 kg/m?  ?BP Readings from Last 3 Encounters:  ?04/18/22 (!) 147/80  ?04/02/22 139/74  ?03/25/22 (!) 138/94  ? ?Wt Readings from Last 3 Encounters:  ?04/18/22 201 lb (91.2 kg)  ?04/08/22 198 lb (89.8 kg)  ?04/02/22 198 lb (89.8 kg)  ? ?  ? ?Physical Exam ?Vitals reviewed.  ?Constitutional:   ?   Appearance: He is well-developed.  ?HENT:  ?   Head: Normocephalic and atraumatic.  ?   Right Ear: External ear normal.  ?   Left Ear: External ear normal.  ?  Nose: Nose normal.  ?Eyes:  ?   General: No scleral icterus. ?   Conjunctiva/sclera: Conjunctivae normal.  ?Neck:  ?   Thyroid: No thyromegaly.  ?Cardiovascular:  ?   Rate and Rhythm: Normal rate and regular rhythm.  ?   Heart sounds: Normal heart sounds.  ?Pulmonary:  ?   Effort: Pulmonary effort is normal.  ?   Breath sounds: Normal breath sounds.  ?Abdominal:  ?   Palpations: Abdomen is soft.  ?Musculoskeletal:  ?   Right lower leg: No edema.  ?   Left lower leg: No edema.  ?Skin: ?   General: Skin is warm and dry.  ?Neurological:  ?   General: No focal deficit present.  ?   Mental Status: He is alert and oriented to person, place, and time.  ?Psychiatric:     ?   Mood and Affect: Mood normal.     ?   Behavior: Behavior normal.     ?   Thought Content: Thought content normal.     ?   Judgment: Judgment normal.  ?  ? ? ?No results found for any visits on 04/18/22. ? Assessment & Plan  ?  ? ?1. Essential  hypertension ?Crease metoprolol to a full tablet twice a day. ? ?2. Cerebrovascular accident (CVA) due to embolism of right middle cerebral artery ([place]) ?All risk factors been treated. ? ?3. Seizure disorder ([place]) ?Followed by neurology. ? ?4. Acute left hemiparesis ([place]) ?From old CVA. ?Clinically he has very little deficit ? ?5. Mass of upper lobe of right lung ?Patient declines tissue diagnosis. ?Family is aware of his decision. ? ?6. Hypercholesterolemia ?On atorvastatin 40 ? ?7. History of tobacco use ? ? ?8. Abnormal CT of the head ? ? ? ?No follow-ups on file.  ?   ? ?I, [name], MD, have reviewed all documentation for this visit. The documentation on 04/25/22 for the exam, diagnosis, procedures, and orders are all accurate and complete. ? ? ? ?S[facility] Mahany Cranford Mon, MD  ?[facility] ?[phone] (phone) ?[phone] (fax) ? ?[facility] ?

## 2022-04-25 ENCOUNTER — Telehealth: Payer: Self-pay | Admitting: Neurology

## 2022-04-25 ENCOUNTER — Telehealth (HOSPITAL_COMMUNITY): Payer: Self-pay | Admitting: Pharmacy Technician

## 2022-04-25 ENCOUNTER — Other Ambulatory Visit: Payer: Self-pay

## 2022-04-25 NOTE — Telephone Encounter (Signed)
Tried to initiate the PA on covermymeds. Unable to locate patient with insurance information on file.  ?

## 2022-04-25 NOTE — Telephone Encounter (Signed)
Patient Advocate Encounter ?  ?Received notification that prior authorization for Valtoco 15 MG Dose 7.'5MG'$ /0.1ML liquid is required. ?  ?PA submitted on 04/25/2022 ?Key [id number] ?Status is pending ?   ? ? ? ?Lyndel Safe, CPhT ?Pharmacy Patient Advocate Specialist ?[place] Patient Advocate Team ?Direct Number: [phone]  Fax: [phone]  ?

## 2022-04-25 NOTE — Telephone Encounter (Signed)
Patient's daughter Jones Broom called to see if the PA for the patient's nasal spray has been completed yet? ? ?She said he almost had an episode last night, she thinks. ?

## 2022-04-25 NOTE — Telephone Encounter (Signed)
Patient Advocate Encounter ? ?Prior Authorization for Valtoco 15 MG Dose 7.'5MG'$ /0.1ML liquid has been approved.   ? ?PA# [id number] ?Effective dates: 04/25/2022 through 12/15/2022 ? ? ? ? ? ?Lyndel Safe, CPhT ?Pharmacy Patient Advocate Specialist ?[place] Patient Advocate Team ?Direct Number: [phone]  Fax: [phone]  ?

## 2022-04-26 NOTE — Telephone Encounter (Addendum)
Prior Authorization for Valtoco 15 MG Dose 7.'5MG'$ /0.1ML liquid has been approved.    ?   ? ?Effective dates: 04/25/2022 through 12/15/2022   ?  ?   ?Called patients daughter Jones Broom and informed her.  ?

## 2022-04-26 NOTE — Telephone Encounter (Signed)
PA sent to the PA team to complete.  ?

## 2022-05-04 ENCOUNTER — Other Ambulatory Visit: Payer: Self-pay | Admitting: Family Medicine

## 2022-05-04 DIAGNOSIS — I10 Essential (primary) hypertension: Secondary | ICD-10-CM

## 2022-05-06 NOTE — Telephone Encounter (Signed)
Refused Norvasc 10 mg  because it was sent to his mail order pharmacy.   This is [facility] requesting.

## 2022-05-14 ENCOUNTER — Other Ambulatory Visit: Payer: Self-pay

## 2022-05-14 MED ORDER — LACOSAMIDE 50 MG PO TABS: 50.0000 mg | ORAL_TABLET | Freq: Two times a day (BID) | ORAL | 3 refills | Status: DC

## 2022-05-16 DIAGNOSIS — C7951 Secondary malignant neoplasm of bone: Secondary | ICD-10-CM | POA: Diagnosis not present

## 2022-05-16 DIAGNOSIS — R338 Other retention of urine: Secondary | ICD-10-CM | POA: Diagnosis not present

## 2022-05-20 ENCOUNTER — Ambulatory Visit (INDEPENDENT_AMBULATORY_CARE_PROVIDER_SITE_OTHER)
Admission: RE | Admit: 2022-05-20 | Discharge: 2022-05-20 | Disposition: A | Discharge status: Home / Self Care | Payer: Medicare Other | Source: Ambulatory Visit | Attending: Emergency Medicine | Admitting: Emergency Medicine

## 2022-05-20 DIAGNOSIS — R911 Solitary pulmonary nodule: Secondary | ICD-10-CM | POA: Diagnosis not present

## 2022-05-20 DIAGNOSIS — R918 Other nonspecific abnormal finding of lung field: Secondary | ICD-10-CM | POA: Diagnosis not present

## 2022-06-05 ENCOUNTER — Other Ambulatory Visit: Payer: Self-pay | Admitting: Family Medicine

## 2022-06-17 ENCOUNTER — Telehealth: Payer: Medicare Other

## 2022-06-18 IMAGING — DX DG CHEST 1V PORT
1 series · 1 of 1 positions shown · non-contrast
Comparison: 08/13/2021

CLINICAL DATA: Endotracheal tube placement

EXAM:
PORTABLE CHEST 1 VIEW

[chest ap]
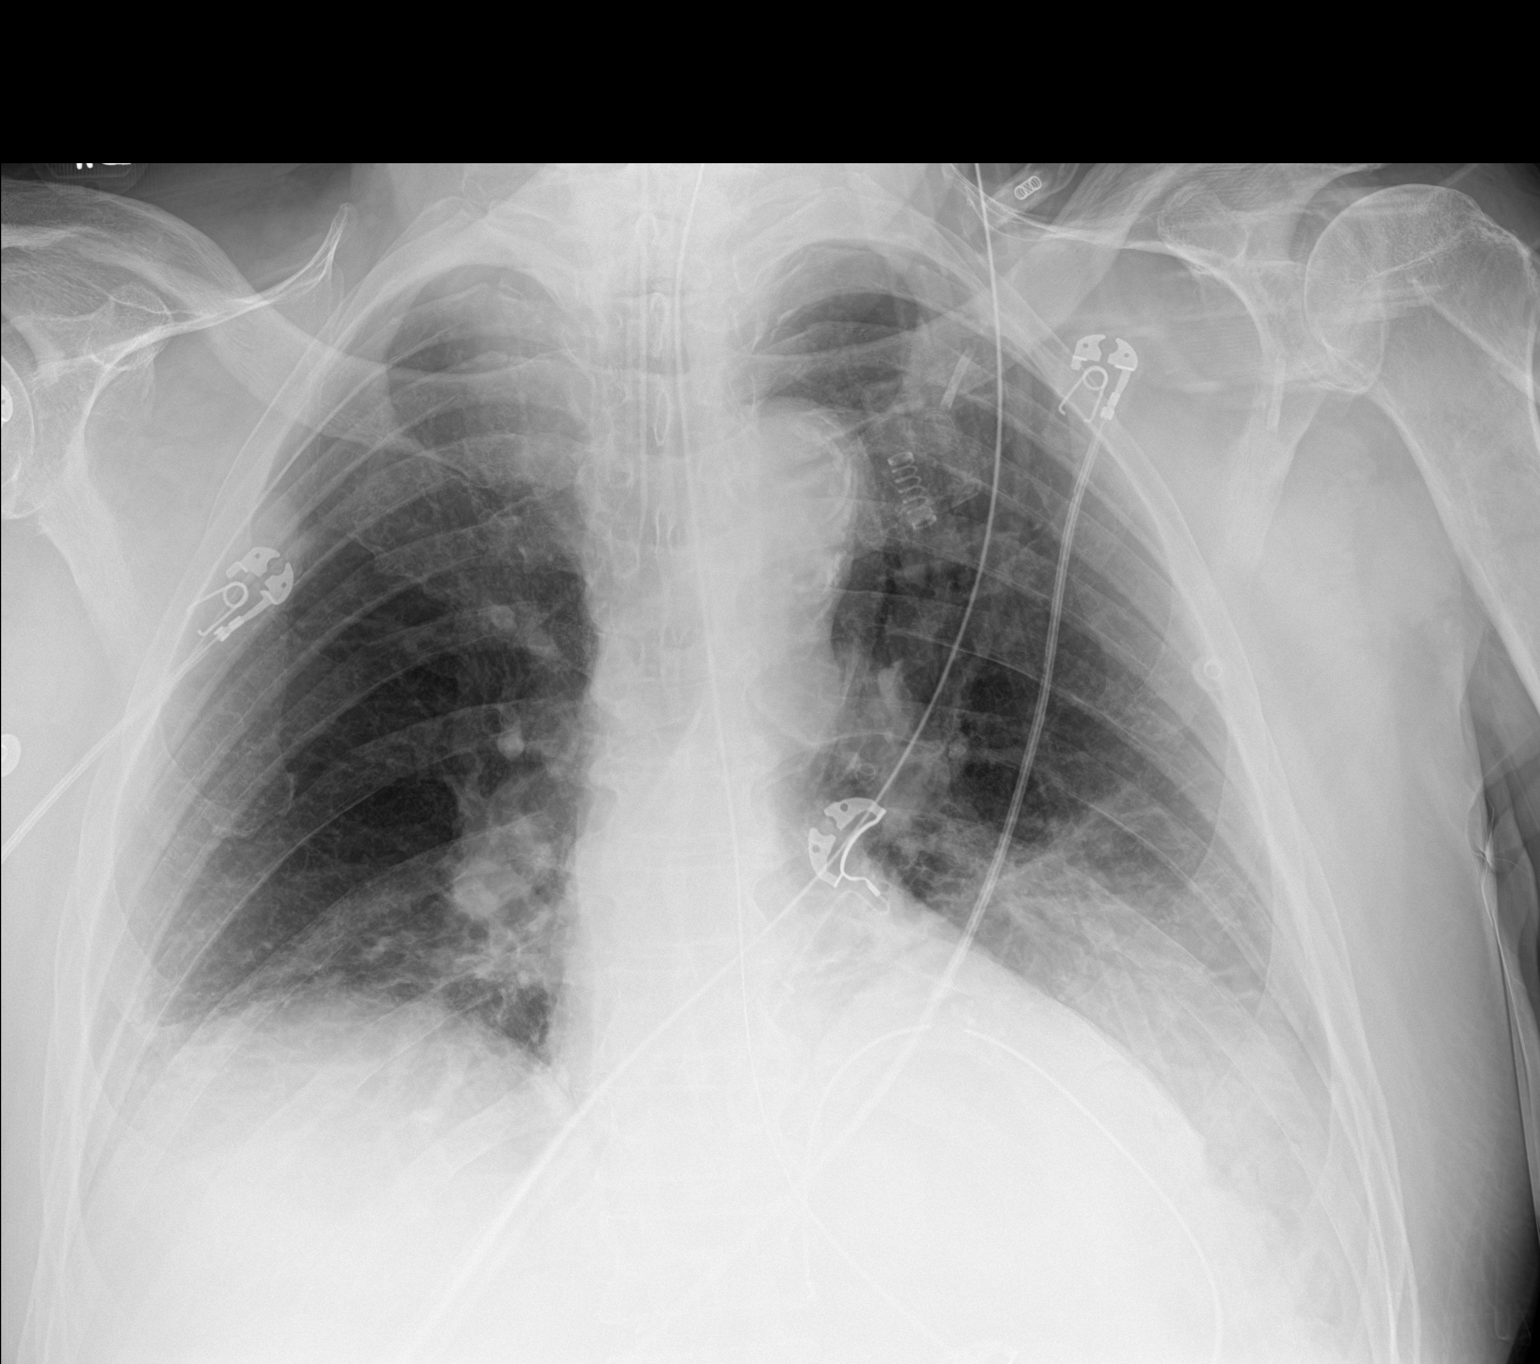

[1 of 1 positions shown; findings below may reference images not displayed]

FINDINGS: Tip of the endotracheal tube is at the level of the clavicular
heads. Esophageal catheter is coiled within the stomach. Bibasilar
opacities, likely mild interstitial pulmonary edema. Poorly
visualized right upper lobe mass.
IMPRESSION: Tip of endotracheal tube at the level of the clavicular heads.
Esophageal catheter coiled within the stomach.

## 2022-06-18 IMAGING — DX DG CHEST 1V PORT
1 series · 1 of 1 positions shown · non-contrast
Comparison: Chest x-ray 05/27/2021, CT chest 05/27/2021

CLINICAL DATA: Acute respiratory failure with hypoxia. Code stroke.

EXAM:
PORTABLE CHEST 1 VIEW

[chest]
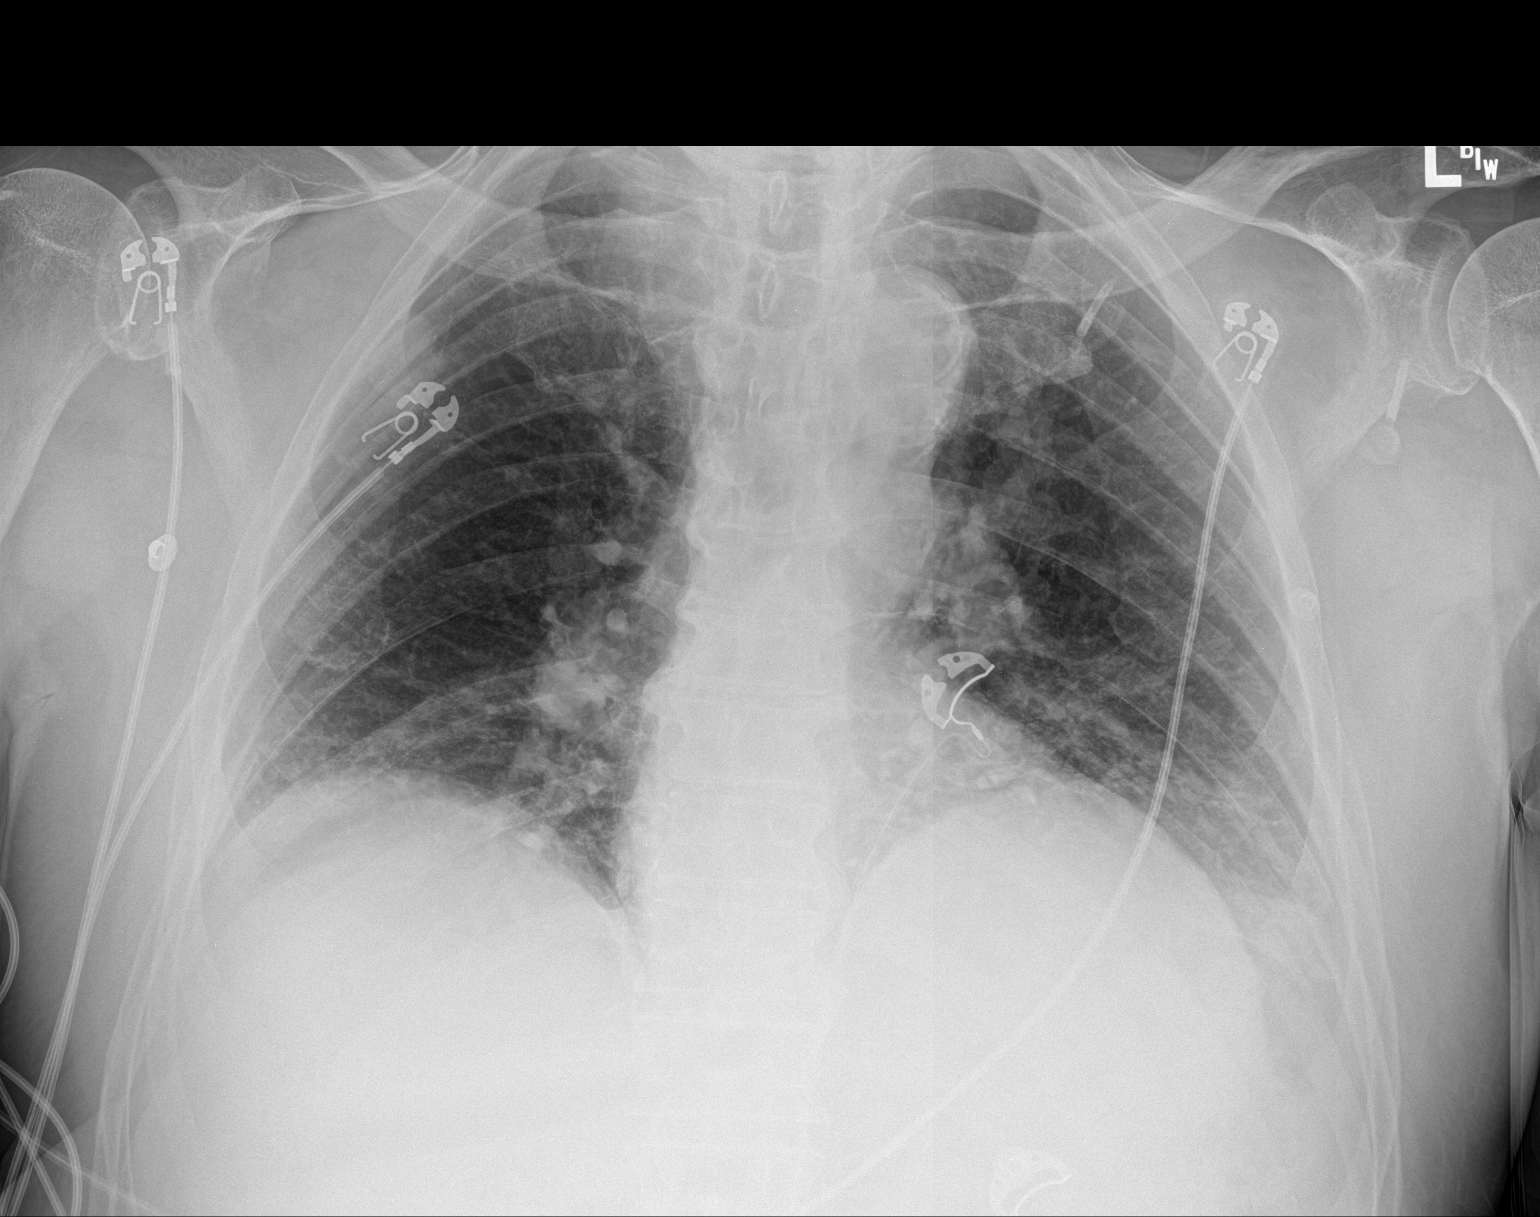

[1 of 1 positions shown; findings below may reference images not displayed]

FINDINGS: The heart and mediastinal contours are unchanged. Aortic
calcification. Persistent elevation of left hemidiaphragm.

Bibasilar streaky airspace opacities likely represent atelectasis.
Redemonstration of a known right upper lobe pulmonary mass
(measuring at least 2.6 cm) that is poorly evaluated on this
radiograph. No focal consolidation. No pulmonary edema. No pleural
effusion. No pneumothorax.

No acute osseous abnormality.
IMPRESSION: 1. No acute cardiopulmonary abnormality in a patient with known
right upper lobe pulmonary mass. As mentioned on CT angiography
05/27/2021: Additional imaging evaluation or consultation with
Pulmonology or Thoracic Surgery recommended if not already obtained.
2.  Aortic Atherosclerosis ([id number]).

## 2022-06-18 IMAGING — CT CT HEAD CODE STROKE
4 series · 16 of 47 positions shown, 18 images · non-contrast
Comparison: CT head May 27, 2021

CLINICAL DATA: Code stroke.  Neuro deficit, acute, stroke suspected

EXAM:
CT HEAD WITHOUT CONTRAST
TECHNIQUE: Contiguous axial images were obtained from the base of the skull
through the vertex without intravenous contrast.

[Series 3: head 5.0 st · axial · 0.48mm/px · z∈[-61,+59]mm · 7 of 34 slices shown, 9 images]
[im 5/34  brain]
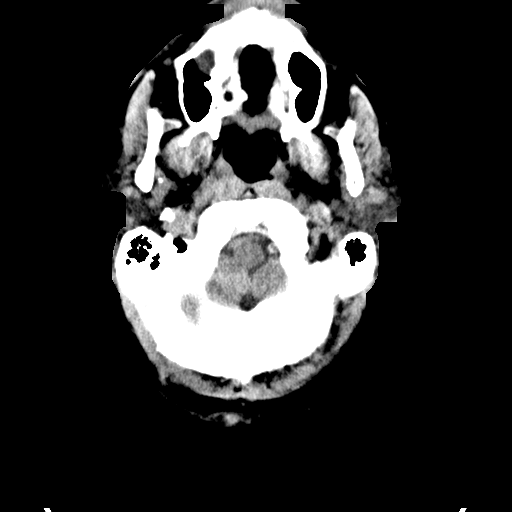
[im 5/34  bone]
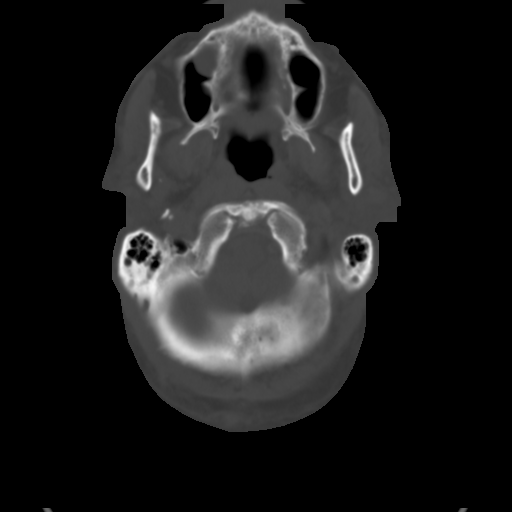
[im 9/34  brain]
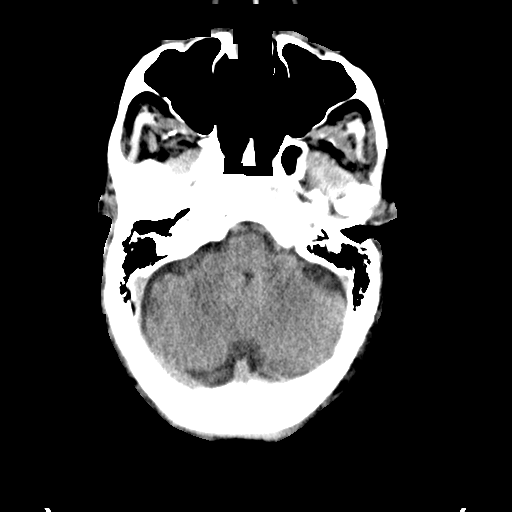
[im 13/34  brain]
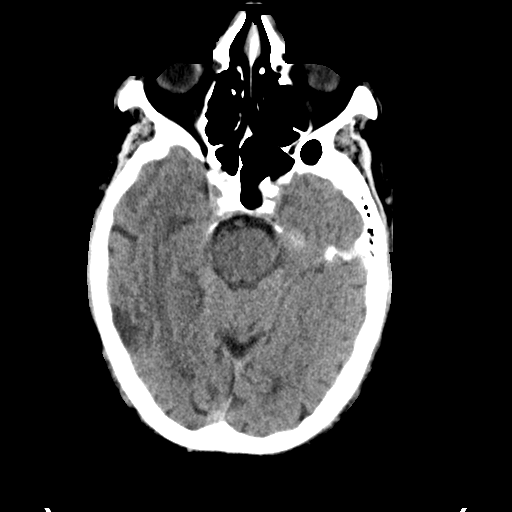
[im 17/34  brain]
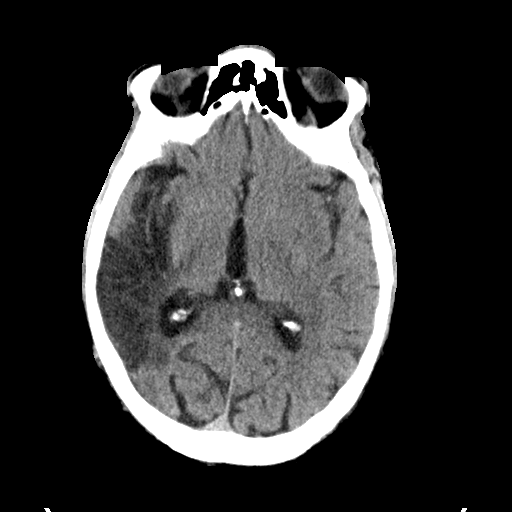
[im 21/34  brain]
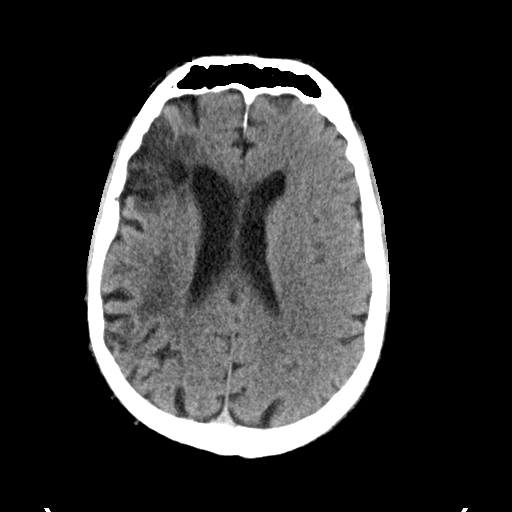
[im 21/34  bone]
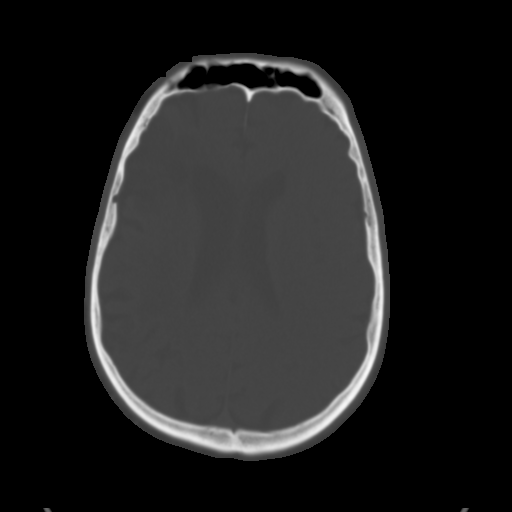
[im 25/34  brain]
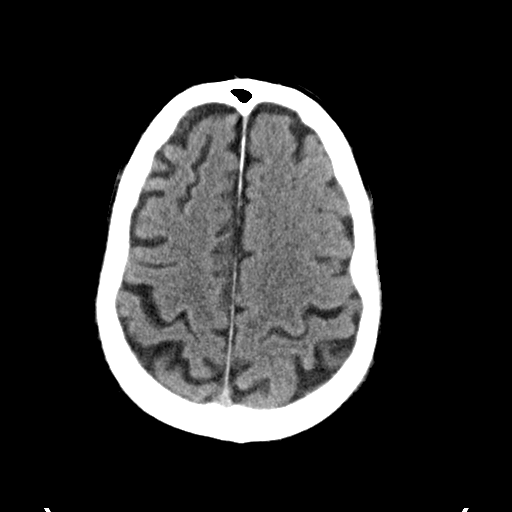
[im 29/34  brain]
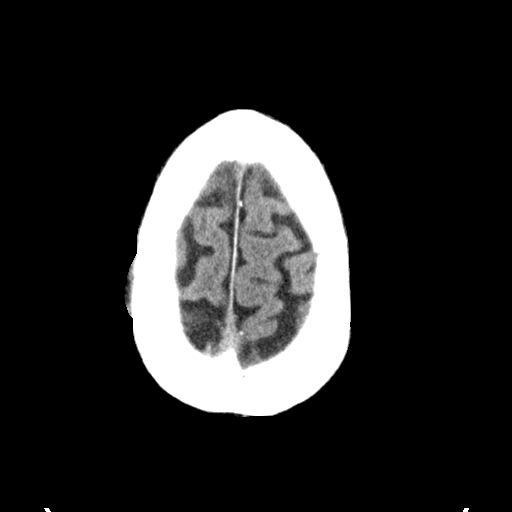

[Series 4: head 2.0 bone · axial · 0.48mm/px · z∈[-65,-31]mm · 3 of 85 slices shown]
[im 9/85  bone]
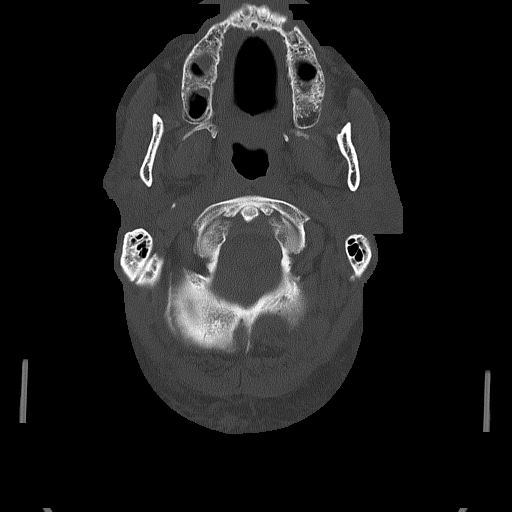
[im 17/85  bone]
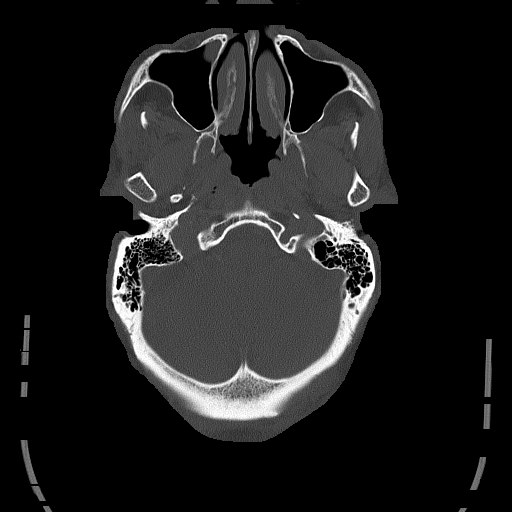
[im 26/85  bone]
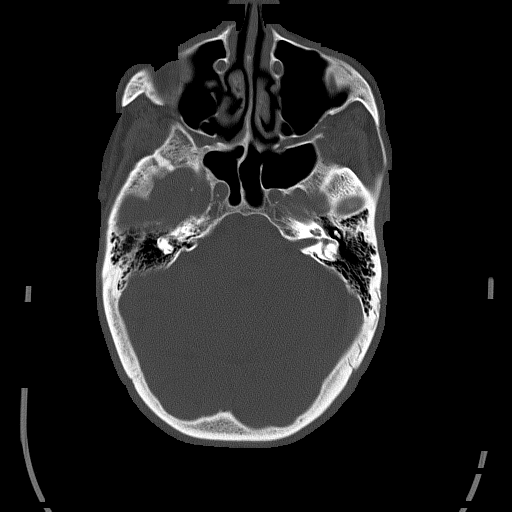

[Series 5: head 3.0 cor st · coronal · 0.34mm/px · 3 of 76 slices shown]
[im 26/76  brain]
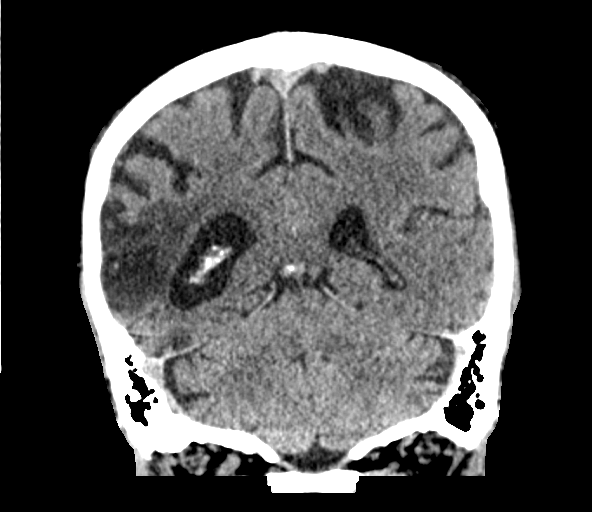
[im 34/76  brain]
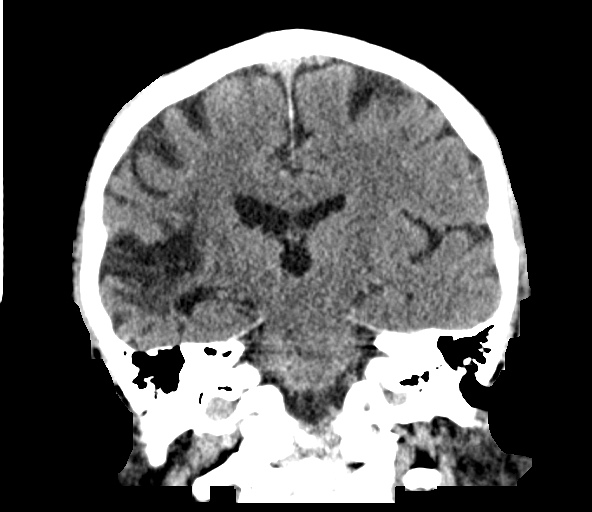
[im 42/76  brain]
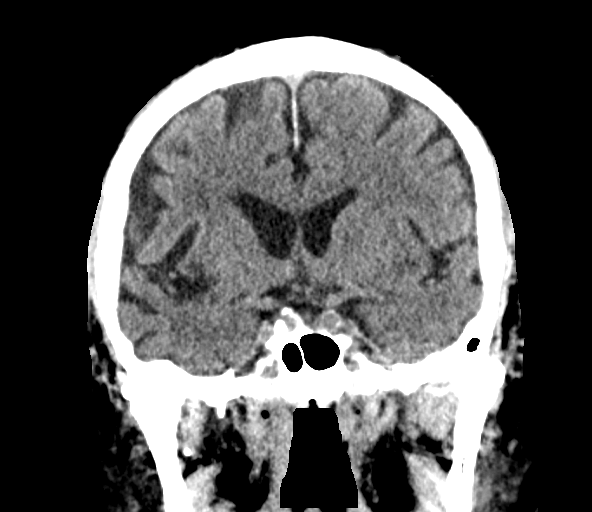

[Series 6: head 3.0 sag st · sagittal · 0.34mm/px · 3 of 54 slices shown]
[im 18/54  brain]
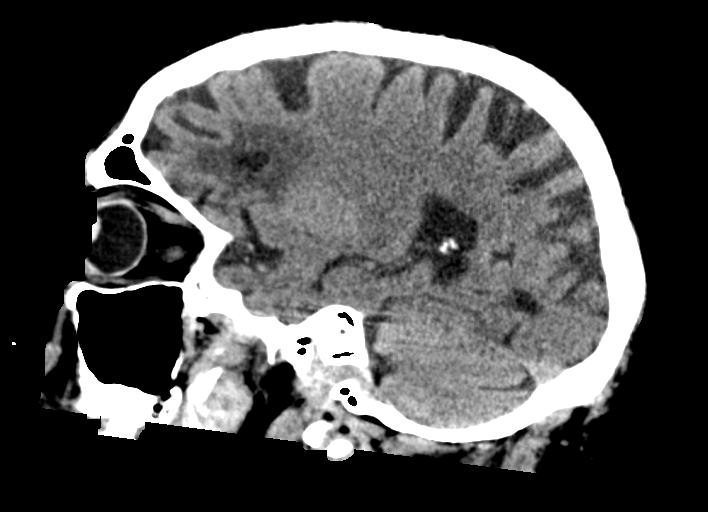
[im 27/54  brain]
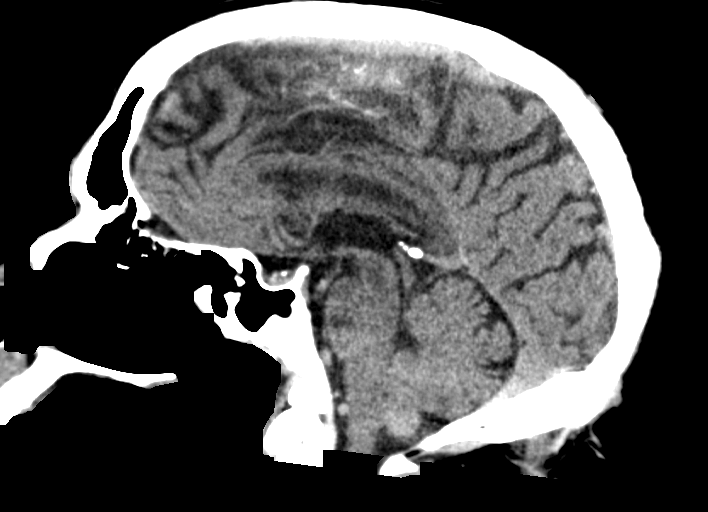
[im 36/54  brain]
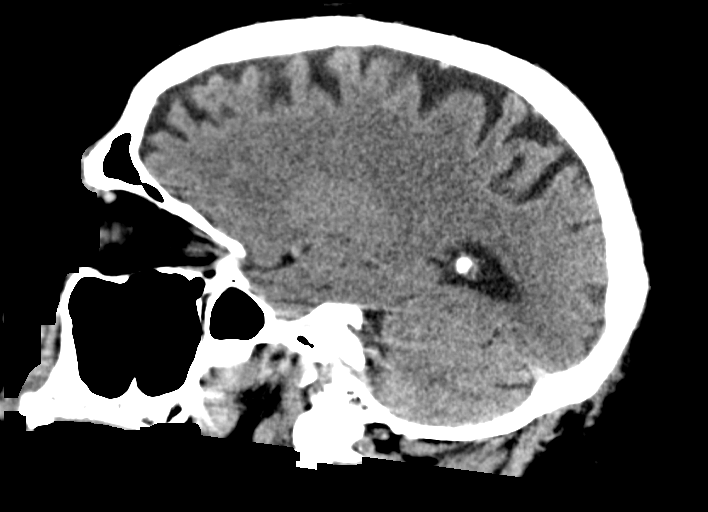

[16 of 47 positions shown; findings below may reference images not displayed]

FINDINGS: Brain: No evidence of acute large vascular territory infarction,
hemorrhage, hydrocephalus, extra-axial collection or mass
lesion/mass effect. Similar remote right MCA territory infarcts with
encephalomalacia. Mild atrophy.

Vascular: No hyperdense vessel identified. Calcific intracranial
atherosclerosis.

Skull: No acute fracture.

Sinuses/Orbits: Right maxillary sinus retention cyst. Mild paranasal
sinus mucosal thickening. No acute orbital findings.

Other: No mastoid effusions.

ASPECTS ([place]ber[place] Stroke Program Early CT Score) Total score (0-10
with 10 being normal): 10.
IMPRESSION: 1. No evidence of acute large vascular territory infarct or acute
hemorrhage. ASPECTS is 10.
2. Large remote right MCA territory infarcts.

Code stroke imaging results were communicated on 08/13/2021 at [DATE] to provider [REDACTED] Via secure text paging.

## 2022-07-22 ENCOUNTER — Ambulatory Visit: Payer: Medicare Other | Admitting: Neurology

## 2022-07-22 ENCOUNTER — Encounter: Payer: Self-pay | Admitting: Neurology

## 2022-07-22 VITALS — BP 145/77 | HR 72 | Ht 68.0 in | Wt 204.0 lb

## 2022-07-22 DIAGNOSIS — G40201 Localization-related (focal) (partial) symptomatic epilepsy and epileptic syndromes with complex partial seizures, not intractable, with status epilepticus: Secondary | ICD-10-CM

## 2022-07-22 MED ORDER — LACOSAMIDE 50 MG PO TABS
50.0000 mg | ORAL_TABLET | Freq: Two times a day (BID) | ORAL | 3 refills | Status: DC
Start: 2022-07-22 — End: 2023-02-04

## 2022-07-22 MED ORDER — LEVETIRACETAM 1000 MG PO TABS: 2000.0000 mg | ORAL_TABLET | Freq: Two times a day (BID) | ORAL | 11 refills | Status: DC

## 2022-07-22 NOTE — Progress Notes (Unsigned)
NEUROLOGY FOLLOW UP OFFICE NOTE  Travis Salazar [id number] Jan 05, 1936  HISTORY OF PRESENT ILLNESS: I had the pleasure of seeing Travis Salazar in follow-up in the neurology clinic on 07/22/2022.  The patient was last seen 4 months ago for seizures. He is again accompanied by his daughter who helps supplement the history today. Since his last visit, they report he continues to do well seizure-free since addition of Lacosamide in April 2023. No further focal seizures since 03/24/22. He is on Levetiracetam '2000mg'$  BID and Lacosamide '50mg'$  BID without side effects. They deny any staring/unresponsive episodes, gaps in time, olfactory/gustatory hallucinations, focal numbness/tingling/weakness, myoclonic jerks. No headaches, dizziness, vision changes, no falls. He usually ambulates with a cane. Mood and sleep are good. His other daughter manages medications.    History on Initial Assessment 09/20/2021: This is an 86 year old right-handed Salazar with a history of hypertension, hyperlipidemia, PE on Eliquis, prostate cancer, prior R MCA stroke, presenting to establish care for new onset seizures. Records from his prior neurologist Dr. Leonie Salazar and hospital records were reviewed. His last visit with [facility] was in 11/2019. He was admitted to [facility] on 08/13/21, He recalls feeling dizzy, "something just was not right, he could talk and told his wife to call EMS and words were slurred. EMS notes indicated left-sided weakness with note of left facial and arm twitching on EMS arrival.  Head CT no acute changes, he was started on Dilantin and given repeated doses of Ativan due to continued focal seizure activity. As seizure activity increased, he was unable to move left arm at all, able to lift right arm without drift. EEG showed LPDs on the right hemisphere, maximal right temporal region with some evolution in frequency but no definite seizure. Background showed diffuse slowing and lateralized right hemisphere slowing. MRI brain no acute  changes, right MCA encephalomalacia with chronic blood products. Follow-up EEG showed right temporal sharp waves. He had an MRI brain on 08/22/21 for vision changes, MRI brain no change from prior. He had an extensive DVT and known right lower lobe lung mass which he declined further workup on.   He lives with his wife, his daughter has been staying with them since hospitalization, managing medications. She denies any further seizures since 08/13/21, no staring/unresponsive episodes. He feels his left leg is 90% better. He has glaucoma with blurred vision and bilateral loss of peripheral vision. He denies any olfactory/gustatory hallucinations, deja vu, rising epigastric sensation, focal tingling/weakness, myoclonic jerks. He denies any headaches, dizziness, neck/back pain, bowel/bladder dysfunction. He has been using a cane since hospital discharge, no falls. Sleep is good. No side effects on Levetiracetam '1000mg'$  BID. Mood is good, however his daughter reports he is "moody."   Epilepsy Risk Factors:  right MCA encephalomalacia. Otherwise he had a normal birth and early development.  There is no history of febrile convulsions, CNS infections such as meningitis/encephalitis, significant traumatic brain injury, neurosurgical procedures, or family history of seizures.   PAST MEDICAL HISTORY: Past Medical History:  Diagnosis Date   Hyperlipidemia    Hypertension    Seizures ([place])    Stroke [facility])     MEDICATIONS: Current Outpatient Medications on File Prior to Visit  Medication Sig Dispense Refill   allopurinol (ZYLOPRIM) 300 MG tablet Take 300 mg by mouth daily as needed.     amLODipine (NORVASC) 10 MG tablet Take 1 tablet (10 mg total) by mouth daily. 90 tablet 1   atorvastatin (LIPITOR) 40 MG tablet TAKE 1 TABLET (40  MG TOTAL) BY MOUTH DAILY AT 6 PM. 90 tablet 0   bicalutamide (CASODEX) 50 MG tablet Take 50 mg by mouth daily.     diazePAM, 15 MG Dose, (VALTOCO 15 MG DOSE) 2 x 7.5 MG/0.1ML LQPK  Spray in each nostril as needed for seizure. May use second dose after 4 hours if needed. 5 each 5   ELIQUIS 5 MG TABS tablet TAKE 1 TABLET BY MOUTH TWICE  DAILY 180 tablet 3   fosfomycin (MONUROL) 3 g PACK SMARTSIG:3 Gram(s) By Mouth Once     lacosamide (VIMPAT) 50 MG TABS tablet Take 1 tablet (50 mg total) by mouth 2 (two) times daily. 180 tablet 3   levETIRAcetam (KEPPRA) 1000 MG tablet Take 2 tablets (2,000 mg total) by mouth 2 (two) times daily. 120 tablet 6   metoprolol tartrate (LOPRESSOR) 25 MG tablet Take 1 tablet (25 mg total) by mouth 2 (two) times daily. 180 tablet 3   mirtazapine (REMERON SOL-TAB) 30 MG disintegrating tablet TAKE 1 TABLET BY MOUTH AT BEDTIME. 90 tablet 4   tamsulosin (FLOMAX) 0.4 MG CAPS capsule Take 0.4 mg by mouth daily.     No current facility-administered medications on file prior to visit.    ALLERGIES: No Known Allergies  FAMILY HISTORY: Family History  Problem Relation Age of Onset   COPD Mother    Heart attack Father     SOCIAL HISTORY: Social History   Socioeconomic History   Marital status: Married    Spouse name: Travis Salazar   Number of children: 4   Years of education: 10th grade   Highest education level: 9th grade  Occupational History   Occupation: retired  Tobacco Use   Smoking status: Former    Packs/day: 1.00    Years: 50.00    Total pack years: 50.00    Types: Cigarettes    Quit date: 12/15/2009    Years since quitting: 12.6   Smokeless tobacco: Never  Vaping Use   Vaping Use: Never used  Substance and Sexual Activity   Alcohol use: No    Alcohol/week: 0.0 standard drinks of alcohol   Drug use: No   Sexual activity: Not on file  Other Topics Concern   Not on file  Social History Narrative   ** Merged History Encounter **       Right handed    Social Determinants of Health   Financial Resource Strain: Low Risk  (04/08/2022)   Overall Financial Resource Strain (CARDIA)    Difficulty of Paying Living Expenses: Not  hard at all  Food Insecurity: No Food Insecurity (04/08/2022)   Hunger Vital Sign    Worried About Running Out of Food in the Last Year: Never true    Ran Out of Food in the Last Year: Never true  Transportation Needs: No Transportation Needs (04/08/2022)   PRAPARE - Hydrologist (Medical): No    Lack of Transportation (Non-Medical): No  Physical Activity: Sufficiently Active (04/08/2022)   Exercise Vital Sign    Days of Exercise per Week: 7 days    Minutes of Exercise per Session: 30 min  Stress: No Stress Concern Present (04/08/2022)   [place]    Feeling of Stress : Not at all  Social Connections: Moderately Isolated (04/08/2022)   Social Connection and Isolation Panel [NHANES]    Frequency of Communication with Friends and Family: More than three times a week    Frequency of  Social Gatherings with Friends and Family: Once a week    Attends Religious Services: More than 4 times per year    Active Member of [employer] or Organizations: No    Attends Archivist Meetings: Never    Marital Status: Widowed  Intimate Partner Violence: Not At Risk (04/08/2022)   Humiliation, Afraid, Rape, and Kick questionnaire    Fear of Current or Ex-Partner: No    Emotionally Abused: No    Physically Abused: No    Sexually Abused: No     PHYSICAL EXAM: Vitals:   07/22/22 1551  BP: (!) 145/77  Pulse: 72  SpO2: 99%   General: No acute distress Head:  Normocephalic/atraumatic Skin/Extremities: No rash, no edema Neurological Exam: alert and awake. No aphasia or dysarthria. Fund of knowledge is appropriate.  Attention and concentration are normal.   Cranial nerves: Pupils equal, round. Extraocular movements intact with no nystagmus. He has difficulty with visual field testing, on individual eye testing, he has to move his head around to see fingers, appears to see better on the right visual field. No  facial asymmetry.  Motor: Bulk and tone normal, muscle strength 5/5 throughout with no pronator drift.   Finger to nose testing intact.  Gait slow and cautious, no ataxia   IMPRESSION: This is a pleasant 86 yo RH Salazar with a history of hypertension, hyperlipidemia, PE on Eliquis, prostate cancer, prior R MCA stroke, who had new onset focal status epilepticus last 08/13/21 with left face/arm twitching.  EEG showed LPDs arising from the right temporal region/right hemisphere. MRI no acute changes, right MCA encephalomalacia. He had another prolonged focal seizure without impaired awareness in 01/2022 and most recently on 03/24/22. None since addition of Lacosamide '50mg'$  BID to Levetiracetam '2000mg'$  BID. Refills sent. He was advised to have an eye exam due to inconsistencies on gross visual field testing today. He is aware of [place] driving laws to stop driving until 6 months seizure-free, however in his case I would strongly recommend eye exam before pursuing driving. Follow-up in 6 months, call for any changes.    Thank you for allowing me to participate in his care.  Please do not hesitate to call for any questions or concerns.   Ellouise Newer, M.D.   CC: Dr. Rosanna Randy

## 2022-07-22 NOTE — Patient Instructions (Signed)
Good to see you doing well.  Continue Levetiracetam (Keppra) '1000mg'$ : take 2 tablets twice a day  2. Continue Lacosamide '50mg'$ : take 1 tablet twice a day  3. Please see your eye doctor  4. Follow-up in 6 months, call for any changes.    Seizure Precautions: 1. If medication has been prescribed for you to prevent seizures, take it exactly as directed.  Do not stop taking the medicine without talking to your doctor first, even if you have not had a seizure in a long time.   2. Avoid activities in which a seizure would cause danger to yourself or to others.  Don't operate dangerous machinery, swim alone, or climb in high or dangerous places, such as on ladders, roofs, or girders.  Do not drive unless your doctor says you may.  3. If you have any warning that you may have a seizure, lay down in a safe place where you can't hurt yourself.    4.  No driving for 6 months from last seizure, as per [facility].   Please refer to the following link on the [place] website for more information: [email or web address]/[email or web address]/drivingu.cfm   5.  Maintain good sleep hygiene.  6.  Contact your doctor if you have any problems that may be related to the medicine you are taking.  7.  Call 911 and bring the patient back to the ED if:        A.  The seizure lasts longer than 5 minutes.       B.  The patient doesn't awaken shortly after the seizure  C.  The patient has new problems such as difficulty seeing, speaking or moving  D.  The patient was injured during the seizure  E.  The patient has a temperature over 102 F (39C)  F.  The patient vomited and now is having trouble breathing

## 2022-07-31 ENCOUNTER — Other Ambulatory Visit: Payer: Self-pay | Admitting: Family Medicine

## 2022-07-31 DIAGNOSIS — I10 Essential (primary) hypertension: Secondary | ICD-10-CM

## 2022-08-01 ENCOUNTER — Other Ambulatory Visit: Payer: Self-pay | Admitting: Family Medicine

## 2022-08-01 MED ORDER — MIRTAZAPINE 30 MG PO TBDP
30.0000 mg | ORAL_TABLET | Freq: Every day | ORAL | 4 refills | Status: DC
Start: 2022-08-01 — End: 2023-02-05

## 2022-08-01 NOTE — Telephone Encounter (Signed)
LOV:04/18/2022 LR:03/07/2022 NOV:08/14/2022

## 2022-08-01 NOTE — Telephone Encounter (Signed)
[facility] faxed refill request for the following medications:  mirtazapine (REMERON SOL-TAB) 30 MG disintegrating tablet   Please advise.

## 2022-08-14 ENCOUNTER — Encounter: Payer: Self-pay | Admitting: Family Medicine

## 2022-08-14 ENCOUNTER — Ambulatory Visit (INDEPENDENT_AMBULATORY_CARE_PROVIDER_SITE_OTHER): Payer: Medicare Other | Admitting: Family Medicine

## 2022-08-14 VITALS — BP 149/77 | HR 65 | Resp 16 | Wt 204.0 lb

## 2022-08-14 DIAGNOSIS — R918 Other nonspecific abnormal finding of lung field: Secondary | ICD-10-CM

## 2022-08-14 DIAGNOSIS — I63411 Cerebral infarction due to embolism of right middle cerebral artery: Secondary | ICD-10-CM | POA: Diagnosis not present

## 2022-08-14 DIAGNOSIS — I471 Supraventricular tachycardia, unspecified: Secondary | ICD-10-CM

## 2022-08-14 DIAGNOSIS — E7800 Pure hypercholesterolemia, unspecified: Secondary | ICD-10-CM | POA: Diagnosis not present

## 2022-08-14 DIAGNOSIS — I350 Nonrheumatic aortic (valve) stenosis: Secondary | ICD-10-CM

## 2022-08-14 DIAGNOSIS — I10 Essential (primary) hypertension: Secondary | ICD-10-CM | POA: Diagnosis not present

## 2022-08-14 DIAGNOSIS — G40909 Epilepsy, unspecified, not intractable, without status epilepticus: Secondary | ICD-10-CM

## 2022-08-14 DIAGNOSIS — C61 Malignant neoplasm of prostate: Secondary | ICD-10-CM

## 2022-08-14 DIAGNOSIS — Z8673 Personal history of transient ischemic attack (TIA), and cerebral infarction without residual deficits: Secondary | ICD-10-CM | POA: Diagnosis not present

## 2022-08-14 DIAGNOSIS — I700 Atherosclerosis of aorta: Secondary | ICD-10-CM

## 2022-08-14 DIAGNOSIS — G8194 Hemiplegia, unspecified affecting left nondominant side: Secondary | ICD-10-CM | POA: Diagnosis not present

## 2022-08-14 DIAGNOSIS — C7951 Secondary malignant neoplasm of bone: Secondary | ICD-10-CM

## 2022-08-14 DIAGNOSIS — R911 Solitary pulmonary nodule: Secondary | ICD-10-CM | POA: Diagnosis not present

## 2022-08-14 DIAGNOSIS — R739 Hyperglycemia, unspecified: Secondary | ICD-10-CM | POA: Diagnosis not present

## 2022-08-14 NOTE — Progress Notes (Signed)
Established patient visit  I,[name],acting as a scribe for [name], MD.,have documented all relevant documentation on the behalf of [name], MD,as directed by  [name], MD while in the presence of [name], MD.   Patient: Travis Salazar.   DOB: 1936/10/01   86 y.o. Male  MRN: [id number] Visit Date: 08/14/2022  Today's healthcare provider: [name], MD   Chief Complaint  Patient presents with   Follow-up   Hypertension   Hyperlipidemia   Subjective    HPI  Patient comes in today for follow-up.  He is doing fairly well. His daughter brings him in today.  He is taking his medications as prescribed. He has no recent seizures.  Is followed by neurology for this. He has follow-up Dr. 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  Hypertension, follow-up  BP Readings from Last 3 Encounters:  08/14/22 (!) 149/77  07/22/22 (!) 145/77  04/18/22 (!) 147/80   Wt Readings from Last 3 Encounters:  08/14/22 204 lb (92.5 kg)  07/22/22 204 lb (92.5 kg)  04/18/22 201 lb (91.2 kg)     He was last seen for hypertension 3 months ago.  Management since that visit includes; increased metoprolol to a full tablet twice a day..    Outside blood pressures are not checking.  --------------------------------------------------------------------------------------------------- Lipid/Cholesterol, follow-up  Last Lipid Panel: Lab Results  Component Value Date   CHOL 187 08/29/2020   LDLCALC 125 (H) 08/29/2020   HDL 40 08/29/2020   TRIG 89 08/14/2021    He was last seen for this 2 years ago.  Management since that visit includes; on atorvastatin.  Last metabolic panel Lab Results  Component Value Date   GLUCOSE 103 (H) 03/24/2022   NA 132 (L) 03/24/2022   K 5.3 (H) 03/24/2022   BUN 11 03/24/2022   CREATININE 0.94 03/24/2022   GFRNONAA >60 03/24/2022    CALCIUM 9.0 03/24/2022   AST 20 01/20/2022   ALT 11 01/20/2022   The ASCVD Risk score (Arnett DK, et al., 2019) failed to calculate for the following reasons:   The 2019 ASCVD risk score is only valid for ages 34 to 69   The patient has a prior MI or stroke diagnosis  ---------------------------------------------------------------------------------------------------   Medications: Outpatient Medications Prior to Visit  Medication Sig   allopurinol (ZYLOPRIM) 300 MG tablet Take 300 mg by mouth daily as needed.   amLODipine (NORVASC) 10 MG tablet TAKE 1 TABLET BY MOUTH DAILY   atorvastatin (LIPITOR) 40 MG tablet TAKE 1 TABLET (40 MG TOTAL) BY MOUTH DAILY AT 6 PM.   bicalutamide (CASODEX) 50 MG tablet Take 50 mg by mouth daily.   diazePAM, 15 MG Dose, (VALTOCO 15 MG DOSE) 2 x 7.5 MG/0.1ML LQPK Spray in each nostril as needed for seizure. May use second dose after 4 hours if needed.   ELIQUIS 5 MG TABS tablet TAKE 1 TABLET BY MOUTH TWICE  DAILY   fosfomycin (MONUROL) 3 g PACK SMARTSIG:3 Gram(s) By Mouth Once   lacosamide (VIMPAT) 50 MG TABS tablet Take 1 tablet (50 mg total) by mouth 2 (two) times daily.   levETIRAcetam (KEPPRA) 1000 MG tablet Take 2 tablets (2,000 mg total) by mouth 2 (two) times daily.   metoprolol tartrate (LOPRESSOR) 25 MG tablet Take 1 tablet (25 mg total) by mouth 2 (two) times daily.   mirtazapine (REMERON  SOL-TAB) 30 MG disintegrating tablet Take 1 tablet (30 mg total) by mouth at bedtime.   tamsulosin (FLOMAX) 0.4 MG CAPS capsule Take 0.4 mg by mouth daily.   No facility-administered medications prior to visit.    Review of Systems  Constitutional:  Negative for appetite change, chills and fever.  Respiratory:  Negative for chest tightness, shortness of breath and wheezing.   Cardiovascular:  Negative for chest pain and palpitations.  Gastrointestinal:  Negative for abdominal pain, nausea and vomiting.    Last metabolic panel Lab Results  Component Value  Date   GLUCOSE 103 (H) 03/24/2022   NA 132 (L) 03/24/2022   K 5.3 (H) 03/24/2022   CL 101 03/24/2022   CO2 25 03/24/2022   BUN 11 03/24/2022   CREATININE 0.94 03/24/2022   GFRNONAA >60 03/24/2022   CALCIUM 9.0 03/24/2022   PHOS 2.9 08/23/2021   PROT 6.9 01/20/2022   ALBUMIN 3.9 01/20/2022   LABGLOB 2.6 08/29/2020   AGRATIO 1.8 08/29/2020   BILITOT 0.7 01/20/2022   ALKPHOS 58 01/20/2022   AST 20 01/20/2022   ALT 11 01/20/2022   ANIONGAP 6 03/24/2022       Objective    BP (!) 149/77 (BP Location: Right Arm, Patient Position: Sitting, Cuff Size: Large)   Pulse 65   Resp 16   Wt 204 lb (92.5 kg)   SpO2 98%   BMI 31.02 kg/m  BP Readings from Last 3 Encounters:  08/14/22 (!) 149/77  07/22/22 (!) 145/77  04/18/22 (!) 147/80   Wt Readings from Last 3 Encounters:  08/14/22 204 lb (92.5 kg)  07/22/22 204 lb (92.5 kg)  04/18/22 201 lb (91.2 kg)      Physical Exam Vitals reviewed.  Constitutional:      Appearance: He is well-developed.  HENT:     Head: Normocephalic and atraumatic.     Right Ear: External ear normal.     Left Ear: External ear normal.     Nose: Nose normal.  Eyes:     General: No scleral icterus.    Conjunctiva/sclera: Conjunctivae normal.  Neck:     Thyroid: No thyromegaly.  Cardiovascular:     Rate and Rhythm: Normal rate and regular rhythm.     Heart sounds: Normal heart sounds.  Pulmonary:     Effort: Pulmonary effort is normal.     Breath sounds: Normal breath sounds.  Abdominal:     Palpations: Abdomen is soft.  Musculoskeletal:     Right lower leg: No edema.     Left lower leg: No edema.     Comments: arthritic changes of hands.  Skin:    General: Skin is warm and dry.  Neurological:     Mental Status: He is alert and oriented to person, place, and time. Mental status is at baseline.  Psychiatric:        Mood and Affect: Mood normal.        Behavior: Behavior normal.        Thought Content: Thought content normal.         Judgment: Judgment normal.       No results found for any visits on 08/14/22.  Assessment & Plan     1. Essential hypertension Home blood pressure readings. - Lipid panel - Hemoglobin A1c - CBC w/Diff/Platelet - Comprehensive Metabolic Panel (CMET)  2. Hypercholesterolemia On atorvastatin 40 - Lipid panel - Hemoglobin A1c - CBC w/Diff/Platelet - Comprehensive Metabolic Panel (CMET)  3. Hyperglycemia A1c - Lipid  panel - Hemoglobin A1c - CBC w/Diff/Platelet - Comprehensive Metabolic Panel (CMET)  4. Lung nodule Mass in right lung followed by Dr. Lamonte Sakai, patient has declined tissue diagnosis to this point - Lipid panel - Hemoglobin A1c - CBC w/Diff/Platelet - Comprehensive Metabolic Panel (CMET)  5. Atherosclerosis of aorta (HCC) Risk factors treated - Lipid panel - Hemoglobin A1c - CBC w/Diff/Platelet - Comprehensive Metabolic Panel (CMET)  6. Paroxysmal supraventricular tachycardia (HCC)  - Lipid panel - Hemoglobin A1c - CBC w/Diff/Platelet - Comprehensive Metabolic Panel (CMET)  7. Cerebrovascular accident (CVA) due to embolism of right middle cerebral artery ([place])   8. Nonrheumatic aortic valve stenosis   9. Seizure disorder ([place]) Followed by neurology  10. Acute left hemiparesis (HCC) CVA.  Patient has recovered nicely from this.  11. History of stroke   12. Mass of upper lobe of right lung   13. Prostate cancer metastatic to bone [facility]) Followed by urology   No follow-ups on file.      I, [name], MD, have reviewed all documentation for this visit. The documentation on 08/18/22 for the exam, diagnosis, procedures, and orders are all accurate and complete.    Sharian Delia Cranford Mon, MD  [facility] [phone] (phone) [phone] (fax)  [place]

## 2022-08-29 ENCOUNTER — Other Ambulatory Visit: Payer: Self-pay | Admitting: Neurology

## 2022-09-05 DIAGNOSIS — C7951 Secondary malignant neoplasm of bone: Secondary | ICD-10-CM | POA: Diagnosis not present

## 2022-09-05 DIAGNOSIS — R338 Other retention of urine: Secondary | ICD-10-CM | POA: Diagnosis not present

## 2022-09-17 ENCOUNTER — Ambulatory Visit: Payer: Medicare Other | Admitting: Cardiology

## 2022-09-30 ENCOUNTER — Other Ambulatory Visit: Payer: Self-pay | Admitting: Family Medicine

## 2022-09-30 DIAGNOSIS — I10 Essential (primary) hypertension: Secondary | ICD-10-CM

## 2022-10-01 NOTE — Telephone Encounter (Signed)
Requested medication (s) are due for refill today:no request, too soon  Requested medication (s) are on the active medication list: yes  Last refill:  04/18/22  Future visit scheduled: no  Notes to clinic:Unable to refill per protocol, last refill by provider no longer at practice, routing for review.    Requested Prescriptions  Pending Prescriptions Disp Refills   metoprolol tartrate (LOPRESSOR) 25 MG tablet [Pharmacy Med Name: Metoprolol Tartrate 25 MG Oral Tablet] 100 tablet 2    Sig: TAKE ONE-HALF TABLET BY MOUTH  TWICE DAILY     Cardiovascular:  Beta Blockers Failed - 09/30/2022  7:44 AM      Failed - Last BP in normal range    BP Readings from Last 1 Encounters:  08/14/22 (!) 149/77         Passed - Last Heart Rate in normal range    Pulse Readings from Last 1 Encounters:  08/14/22 65         Passed - Valid encounter within last 6 months    Recent Outpatient Visits           1 month ago Essential hypertension   [facility] [name], MD   5 months ago Essential hypertension   [facility] [name], MD   7 months ago Weight loss, unintentional   [facility] [name], MD   8 months ago Seizure disorder [facility])   [facility] Myles Gip, DO   1 year ago Acute deep vein thrombosis (DVT) of left lower extremity, unspecified vein [facility])   [facility] [name], MD       Future Appointments             In 1 month Skains, Thana Farr, MD [facility]. [facility], [facility]

## 2022-10-09 ENCOUNTER — Encounter: Payer: Self-pay | Admitting: Emergency Medicine

## 2022-10-09 ENCOUNTER — Ambulatory Visit: Payer: Medicare Other | Admitting: Emergency Medicine

## 2022-10-09 DIAGNOSIS — I2699 Other pulmonary embolism without acute cor pulmonale: Secondary | ICD-10-CM | POA: Diagnosis not present

## 2022-10-09 DIAGNOSIS — R918 Other nonspecific abnormal finding of lung field: Secondary | ICD-10-CM | POA: Diagnosis not present

## 2022-10-09 NOTE — Assessment & Plan Note (Signed)
This is decreased in size and is consistent with some parenchymal scarring following either an inflammatory process or possibly pulmonary infarct from his pulmonary embolism.  Inconsistent with malignancy.  I reassured him about this today.  He should not need any surveillance imaging to follow this.

## 2022-10-09 NOTE — Patient Instructions (Addendum)
We reviewed your CT scans of the chest today.  The right upper lobe pulmonary nodule has decreased in size and is consistent with some scarring following your pulmonary embolism.  This is good news.  You should not need any repeat CT scan of the chest to follow this area. Continue Eliquis as you have been taking it. Follow with Dr. Lamonte Sakai if needed for any changes in your respiratory status.

## 2022-10-09 NOTE — Assessment & Plan Note (Signed)
Remains on Eliquis.  He may be a candidate to change to the lower dose for VTE prophylaxis.  I will have him discuss this with Dr. Rosanna Randy his PCP.

## 2022-10-09 NOTE — Progress Notes (Signed)
Subjective:    Patient ID: Travis Salazar., male    DOB: July 15, 1936, 86 y.o.   MRN: [id number]  HPI 86 year old former smoker (50 pack years) with a history of hypertension and hyperlipidemia, CVA, pulmonary embolism diagnosed in June 2022. He had been followed for an enlarging right upper lobe pulmonary nodule and has seen Dr. Julien Nordmann, Dr. Roxan Hockey.  He wanted to be conservative and decided not to pursue surgical resection or biopsy in February 2021.  He was admitted with status epilepticus in August required endotracheal intubation.  MRI brain 08/14/2021 and then again on 08/22/2021 did not show any evidence of metastatic disease.  CT chest 05/27/2021 reviewed by me, shows multiple pulmonary emboli, no mediastinal or hilar lymphadenopathy, mild centrilobular emphysema, interval increase in size of a lobulated 3.5 x 1.9 cm peripheral right upper lobe mass with several scattered right pulmonary micronodules some of which are calcified.  ROV 01/22/22 --Travis Salazar is an 86 year old gentleman, former smoker whom I have seen for an enlarging right upper lobe pulmonary mass.  He has wanted to be conservative, to avoid biopsy or surgical resection. PMH: Significant for CVA, PE, hypertension, seizure disorder. He was in ED w a seizure 2/5  CT scan of the chest performed 11/28/2021 reviewed by me, showed slight interval increase in size of his right upper lobe subpleural mass now 2.5 x 2.3 cm.  No evidence of lymphadenopathy or metastatic disease  ROV 10/09/22 --follow-up visit for 86 year old man with an enlarging right upper lobe mass that we have been following conservatively.  He has wanted to avoid biopsy or surgical resection if possible.  He has a history of CVA, PE, hypertension and seizure disorder.  He was in the ED with another seizure 03/24/22.  He underwent a repeat CT scan of his chest 05/20/2022 that was reassuring as below. No respiratory sx.   CT chest 05/20/2022 reviewed by me showed an interval  decrease in size of his right upper lobe subpleural nodules to 1.6 cm from 2.7 cm, suggestive of an inflammatory process.   Review of Systems As per HPI  Past Medical History:  Diagnosis Date   Hyperlipidemia    Hypertension    Seizures ([place])    Stroke ([place])      Family History  Problem Relation Age of Onset   COPD Mother    Heart attack Father      Social History   Socioeconomic History   Marital status: Married    Spouse name: Travis Salazar   Number of children: 4   Years of education: 10th grade   Highest education level: 9th grade  Occupational History   Occupation: retired  Tobacco Use   Smoking status: Former    Packs/day: 1.00    Years: 50.00    Total pack years: 50.00    Types: Cigarettes    Quit date: 12/15/2009    Years since quitting: 12.8   Smokeless tobacco: Never  Vaping Use   Vaping Use: Never used  Substance and Sexual Activity   Alcohol use: No    Alcohol/week: 0.0 standard drinks of alcohol   Drug use: No   Sexual activity: Not on file  Other Topics Concern   Not on file  Social History Narrative   ** Merged History Encounter ** Right handed    Caffeine 2 cups daily   Home is one story   Social Determinants of Health   Financial Resource Strain: Low Risk  (04/08/2022)   Overall Emergency planning/management officer Strain (  CARDIA)    Difficulty of Paying Living Expenses: Not hard at all  Food Insecurity: No Food Insecurity (04/08/2022)   Hunger Vital Sign    Worried About Running Out of Food in the Last Year: Never true    Ran Out of Food in the Last Year: Never true  Transportation Needs: No Transportation Needs (04/08/2022)   PRAPARE - Hydrologist (Medical): No    Lack of Transportation (Non-Medical): No  Physical Activity: Sufficiently Active (04/08/2022)   Exercise Vital Sign    Days of Exercise per Week: 7 days    Minutes of Exercise per Session: 30 min  Stress: No Stress Concern Present (04/08/2022)   [place]    Feeling of Stress : Not at all  Social Connections: Moderately Isolated (04/08/2022)   Social Connection and Isolation Panel [NHANES]    Frequency of Communication with Friends and Family: More than three times a week    Frequency of Social Gatherings with Friends and Family: Once a week    Attends Religious Services: More than 4 times per year    Active Member of [employer] or Organizations: No    Attends Archivist Meetings: Never    Marital Status: Widowed  Intimate Partner Violence: Not At Risk (04/08/2022)   Humiliation, Afraid, Rape, and Kick questionnaire    Fear of Current or Ex-Partner: No    Emotionally Abused: No    Physically Abused: No    Sexually Abused: No    Was exposed to auto-body paint [place] native  No Known Allergies   Outpatient Medications Prior to Visit  Medication Sig Dispense Refill   allopurinol (ZYLOPRIM) 300 MG tablet Take 300 mg by mouth daily as needed.     amLODipine (NORVASC) 10 MG tablet TAKE 1 TABLET BY MOUTH DAILY 90 tablet 3   atorvastatin (LIPITOR) 40 MG tablet TAKE 1 TABLET (40 MG TOTAL) BY MOUTH DAILY AT 6 PM. 90 tablet 0   bicalutamide (CASODEX) 50 MG tablet Take 50 mg by mouth daily.     diazePAM, 15 MG Dose, (VALTOCO 15 MG DOSE) 2 x 7.5 MG/0.1ML LQPK Spray in each nostril as needed for seizure. May use second dose after 4 hours if needed. 5 each 5   ELIQUIS 5 MG TABS tablet TAKE 1 TABLET BY MOUTH TWICE  DAILY 180 tablet 3   lacosamide (VIMPAT) 50 MG TABS tablet Take 1 tablet (50 mg total) by mouth 2 (two) times daily. 180 tablet 3   levETIRAcetam (KEPPRA) 1000 MG tablet TAKE 2 TABLETS BY MOUTH TWICE  DAILY 360 tablet 1   metoprolol tartrate (LOPRESSOR) 25 MG tablet TAKE ONE-HALF TABLET BY MOUTH  TWICE DAILY 90 tablet 3   mirtazapine (REMERON SOL-TAB) 30 MG disintegrating tablet Take 1 tablet (30 mg total) by mouth at bedtime. 90 tablet 4   tamsulosin (FLOMAX) 0.4 MG CAPS capsule Take  0.4 mg by mouth daily.     fosfomycin (MONUROL) 3 g PACK SMARTSIG:3 Gram(s) By Mouth Once     No facility-administered medications prior to visit.         Objective:   Physical Exam Vitals:   10/09/22 1121  BP: (!) 150/82  Pulse: 81  Temp: 98.3 F (36.8 C)  TempSrc: Oral  SpO2: 98%  Weight: 213 lb (96.6 kg)  Height: '5\' 8"'$  (1.727 m)    Gen: Pleasant, well-nourished, in no distress,  normal affect  ENT: No lesions,  mouth clear,  oropharynx clear, no postnasal drip  Neck: No JVD, no stridor  Lungs: No use of accessory muscles, no crackles or wheezing on normal respiration, no wheeze on forced expiration  Cardiovascular: RRR, heart sounds normal, no murmur or gallops, no peripheral edem  Musculoskeletal: No deformities, no cyanosis or clubbing  Neuro: alert, awake, non focal  Skin: Warm, no lesions or rash      Assessment & Plan:  Acute pulmonary embolism without acute cor pulmonale (HCC) Remains on Eliquis.  He may be a candidate to change to the lower dose for VTE prophylaxis.  I will have him discuss this with Dr. Rosanna Randy his PCP.   Mass of upper lobe of right lung This is decreased in size and is consistent with some parenchymal scarring following either an inflammatory process or possibly pulmonary infarct from his pulmonary embolism.  Inconsistent with malignancy.  I reassured him about this today.  He should not need any surveillance imaging to follow this.     [name], MD, PhD 10/09/2022, 11:38 AM  Pulmonary and Critical Care [phone] or if no answer before 7:00PM call [phone] For any issues after 7:00PM please call eLink [phone]

## 2022-11-14 ENCOUNTER — Ambulatory Visit: Payer: Medicare Other | Attending: Cardiology | Admitting: Cardiology

## 2022-11-14 ENCOUNTER — Encounter: Payer: Self-pay | Admitting: Cardiology

## 2022-11-14 VITALS — BP 120/70 | HR 72 | Ht 68.0 in | Wt 210.0 lb

## 2022-11-14 DIAGNOSIS — I82402 Acute embolism and thrombosis of unspecified deep veins of left lower extremity: Secondary | ICD-10-CM | POA: Diagnosis not present

## 2022-11-14 DIAGNOSIS — I10 Essential (primary) hypertension: Secondary | ICD-10-CM | POA: Diagnosis not present

## 2022-11-14 DIAGNOSIS — I350 Nonrheumatic aortic (valve) stenosis: Secondary | ICD-10-CM | POA: Diagnosis not present

## 2022-11-14 NOTE — Patient Instructions (Signed)
Medication Instructions:  Your physician recommends that you continue on your current medications as directed. Please refer to the Current Medication list given to you today.  *If you need a refill on your cardiac medications before your next appointment, please call your pharmacy*   Lab Work: None ordered.  If you have labs (blood work) drawn today and your tests are completely normal, you will receive your results only by: [place] (if you have MyChart) OR A paper copy in the mail If you have any lab test that is abnormal or we need to change your treatment, we will call you to review the results.   Testing/Procedures: Your physician has requested that you have an echocardiogram. Echocardiography is a painless test that uses sound waves to create images of your heart. It provides your doctor with information about the size and shape of your heart and how well your heart's chambers and valves are working. This procedure takes approximately one hour. There are no restrictions for this procedure. Please do NOT wear cologne, perfume, aftershave, or lotions (deodorant is allowed). Please arrive 15 minutes prior to your appointment time.    Follow-Up: At [facility], you and your health needs are our priority.  As part of our continuing mission to provide you with exceptional heart care, we have created designated Provider Care Teams.  These Care Teams include your primary Cardiologist (physician) and Advanced Practice Providers (APPs -  Physician Assistants and Nurse Practitioners) who all work together to provide you with the care you need, when you need it.  We recommend signing up for the patient portal called "MyChart".  Sign up information is provided on this After Visit Summary.  MyChart is used to connect with patients for Virtual Visits (Telemedicine).  Patients are able to view lab/test results, encounter notes, upcoming appointments, etc.  Non-urgent messages can be  sent to your provider as well.   To learn more about what you can do with MyChart, go to [email or web address].    Your next appointment:   12 months with Dr Marlou Porch  Important Information About Sugar

## 2022-11-14 NOTE — Progress Notes (Signed)
Cardiology Office Note:    Date:  11/14/2022   ID:  [name], DOB 11/29/1936, MRN [id number]  PCP:  [name], MD  Cardiologist:  Candee Furbish, MD  Electrophysiologist:  None   Referring MD: [name],*     History of Present Illness:    Travis Salazar. is a 86 y.o. male here for follow-up for DVT, moderate aortic stenosis. .  Sees Dr. Lamonte Sakai as well last visit on 10/09/2022, 50-pack-year smoker history of stroke pulmonary embolism diagnosed in June 2022 also has an enlarging right upper lobe pulmonary nodule seen by Dr. Roxan Hockey and Earlie Server.  Conservative strategy.  Decided not to pursue resection in February 2021.  MRI of brain did not show any evidence of metastatic disease in 2022.  Thankfully, repeat CT scan of chest showed decreasing size of mass which was likely inflammatory or scarring.  Dr. Lamonte Sakai has released him.  He also was seen by the ED on 09/07/2021 present with bleeding and bruising around foley catheter site.Catheter was expected for removal and voiding trail in 4 more days since ED visit. He developed blood on the edge of his penis without substantial pain however with mild irritation.   Today, he is accompanied by his daughter.  Overall he states that he is doing quite well no chest pain fevers chills nausea vomiting syncope bleeding.  No further seizure-like activity.  Still taking his Eliquis.  Past Medical History:  Diagnosis Date   Hyperlipidemia    Hypertension    Seizures ([place])    Stroke [facility])     Past Surgical History:  Procedure Laterality Date   LIPOMA EXCISION     neck   PROSTATECTOMY     due to prostate cancer    Current Medications: Current Meds  Medication Sig   allopurinol (ZYLOPRIM) 300 MG tablet Take 300 mg by mouth daily as needed.   amLODipine (NORVASC) 10 MG tablet TAKE 1 TABLET BY MOUTH DAILY   atorvastatin (LIPITOR) 40 MG tablet TAKE 1 TABLET (40 MG TOTAL) BY MOUTH DAILY AT 6 PM.   bicalutamide  (CASODEX) 50 MG tablet Take 50 mg by mouth daily.   diazePAM, 15 MG Dose, (VALTOCO 15 MG DOSE) 2 x 7.5 MG/0.1ML LQPK Spray in each nostril as needed for seizure. May use second dose after 4 hours if needed.   ELIQUIS 5 MG TABS tablet TAKE 1 TABLET BY MOUTH TWICE  DAILY   lacosamide (VIMPAT) 50 MG TABS tablet Take 1 tablet (50 mg total) by mouth 2 (two) times daily.   levETIRAcetam (KEPPRA) 1000 MG tablet TAKE 2 TABLETS BY MOUTH TWICE  DAILY   metoprolol tartrate (LOPRESSOR) 25 MG tablet TAKE ONE-HALF TABLET BY MOUTH  TWICE DAILY   mirtazapine (REMERON SOL-TAB) 30 MG disintegrating tablet Take 1 tablet (30 mg total) by mouth at bedtime.   tamsulosin (FLOMAX) 0.4 MG CAPS capsule Take 0.4 mg by mouth daily.     Allergies:   Patient has no known allergies.   Social History   Socioeconomic History   Marital status: Married    Spouse name: [name]   Number of children: 4   Years of education: 10th grade   Highest education level: 9th grade  Occupational History   Occupation: retired  Tobacco Use   Smoking status: Former    Packs/day: 1.00    Years: 50.00    Total pack years: 50.00    Types: Cigarettes    Quit date: 12/15/2009  Years since quitting: 12.9   Smokeless tobacco: Never  Vaping Use   Vaping Use: Never used  Substance and Sexual Activity   Alcohol use: No    Alcohol/week: 0.0 standard drinks of alcohol   Drug use: No   Sexual activity: Not on file  Other Topics Concern   Not on file  Social History Narrative   ** Merged History Encounter ** Right handed    Caffeine 2 cups daily   Home is one story   Social Determinants of Health   Financial Resource Strain: Low Risk  (04/08/2022)   Overall Financial Resource Strain (CARDIA)    Difficulty of Paying Living Expenses: Not hard at all  Food Insecurity: No Food Insecurity (04/08/2022)   Hunger Vital Sign    Worried About Running Out of Food in the Last Year: Never true    Ran Out of Food in the Last Year: Never true   Transportation Needs: No Transportation Needs (04/08/2022)   PRAPARE - Hydrologist (Medical): No    Lack of Transportation (Non-Medical): No  Physical Activity: Sufficiently Active (04/08/2022)   Exercise Vital Sign    Days of Exercise per Week: 7 days    Minutes of Exercise per Session: 30 min  Stress: No Stress Concern Present (04/08/2022)   [place]    Feeling of Stress : Not at all  Social Connections: Moderately Isolated (04/08/2022)   Social Connection and Isolation Panel [NHANES]    Frequency of Communication with Friends and Family: More than three times a week    Frequency of Social Gatherings with Friends and Family: Once a week    Attends Religious Services: More than 4 times per year    Active Member of [employer] or Organizations: No    Attends Archivist Meetings: Never    Marital Status: Widowed     Family History: The patient's family history includes COPD in his mother; Heart attack in his father.  ROS:   Please see the history of present illness.   (+) leg swelling and tightness  All other systems reviewed and are negative.  EKGs/Labs/Other Studies Reviewed:    The following studies were reviewed today: ECHO 6/22:  IMPRESSIONS   1. Left ventricular ejection fraction, by estimation, is 60 to 65%. The  left ventricle has normal function. The left ventricle has no regional  wall motion abnormalities. There is mild left ventricular hypertrophy.  Left ventricular diastolic parameters  are consistent with Grade I diastolic dysfunction (impaired relaxation).   2. Right ventricular systolic function is normal. The right ventricular  size is normal. There is moderate to severely elevated pulmonary artery  systolic pressure.   3. The mitral valve is normal in structure. No evidence of mitral valve  regurgitation.   4. The aortic valve is calcified. There is severe  calcifcation of the  aortic valve. Aortic valve regurgitation is not visualized. Moderate  aortic valve stenosis. Vmax 3.3 m/s, MG 24 mmHg, AVA 1.0 cm^2, DI 0.3   Echocardiogram 05/2019:  IMPRESSIONS   1. Left ventricular ejection fraction, by estimation, is 60 to 65%. The  left ventricle has normal function. The left ventricle has no regional  wall motion abnormalities. There is mild left ventricular hypertrophy.  Left ventricular diastolic parameters  are consistent with Grade I diastolic dysfunction (impaired relaxation).   2. Right ventricular systolic function is normal. The right ventricular  size is normal. There is  moderate to severely elevated pulmonary artery  systolic pressure.   3. The mitral valve is normal in structure. No evidence of mitral valve  regurgitation.   4. The aortic valve is calcified. There is severe calcifcation of the  aortic valve. Aortic valve regurgitation is not visualized. Moderate  aortic valve stenosis. Vmax 3.3 m/s, MG 24 mmHg, AVA 1.0 cm^2, DI 0.3   CARDIAC TELEMETRY 11/20:  Sinus rhythm with first-degree AV block. No evidence of atrial fibrillation Transient episode of second-degree heart block type I. No indication for pacemaker No significant pauses.  ECHO 07/20:   IMPRESSIONS   1. The left ventricle has normal systolic function, with an ejection  fraction of 55-60%. The cavity size was normal. Left ventricular diastolic  Doppler parameters are consistent with pseudonormal. No evidence of left  ventricular regional wall motion  abnormalities.   2. The right ventricle has normal systolc function. The cavity was  normal. There is no increase in right ventricular wall thickness.   3. There is mild to moderate mitral annular calcification present. No  evidence of mitral valve stenosis. Mild mitral regurgitation.   4. The aortic valve is tricuspid Severe calcifcation of the aortic valve.  Mild-moderate stenosis of the aortic valve. Mean  gradient 16 mmHg with  calculated AVA 1.17 cm^2.   5. Normal IVC size. PA systolic pressure 51 mmHg.   6. The aortic root is normal in size and structure.   EKG:  09/22: sinus rhythm,HR 80 bpm   Recent Labs: 01/20/2022: ALT 11 03/24/2022: BUN 11; Creatinine, Ser 0.94; Hemoglobin 12.1; Magnesium 2.0; Platelets 137; Potassium 5.3; Sodium 132  Recent Lipid Panel    Component Value Date/Time   CHOL 187 08/29/2020 1135   TRIG 89 08/14/2021 0351   HDL 40 08/29/2020 1135   CHOLHDL 4.7 08/29/2020 1135   CHOLHDL 7.6 06/27/2019 0548   VLDL 25 06/27/2019 0548   LDLCALC 125 (H) 08/29/2020 1135   LDLCALC 119 (H) 10/06/2017 1208    Physical Exam:    VS:  BP 120/70 (BP Location: Left Arm, Patient Position: Sitting, Cuff Size: Normal)   Pulse 72   Ht '5\' 8"'$  (1.727 m)   Wt 210 lb (95.3 kg)   SpO2 97%   BMI 31.93 kg/m     Wt Readings from Last 3 Encounters:  11/14/22 210 lb (95.3 kg)  10/09/22 213 lb (96.6 kg)  08/14/22 204 lb (92.5 kg)     GEN:  Well nourished, well developed in no acute distress HEENT: Normal NECK: No JVD; No carotid bruits LYMPHATICS: No lymphadenopathy CARDIAC: RRR, 2/6 systolic murmur, no rubs, gallops, no significant changes in murmur RESPIRATORY:  Clear to auscultation without rales, wheezing or rhonchi  ABDOMEN: Soft, non-tender, non-distended MUSCULOSKELETAL:  No edema; No deformity  SKIN: Warm and dry NEUROLOGIC:  Alert and oriented x 3 PSYCHIATRIC:  Normal affect   ASSESSMENT:    1. Nonrheumatic aortic valve stenosis   2. Deep vein thrombosis (DVT) of left lower extremity, unspecified chronicity, unspecified vein (HCC)   3. Essential hypertension      PLAN:   DVT (deep venous thrombosis) (HCC) Left DVT lower extremity, extensive traversing of the iliac region.  He had an appointment coming up on September 21 2022 with vascular surgery.  He does state that with Eliquis therapy it had improved leg and his left leg.  He has had prior PE back in in June  2022.   Aortic stenosis Moderate aortic stenosis seen on echocardiogram March 2022.  Personally reviewed.  Repeating echocardiogram.  Discussed potential therapy for aortic valve stenosis in the future.   Essential (primary) hypertension Doing well with current medical management with amlodipine 10 mg as well as metoprolol 12.5 mg twice a day.  Hyperlipidemia Continuing with atorvastatin 40 mg a day, no myalgias     FOLLOW UP IN 1 YEAR   Medication Adjustments/Labs and Tests Ordered: Current medicines are reviewed at length with the patient today.  Concerns regarding medicines are outlined above.  Orders Placed This Encounter  Procedures   ECHOCARDIOGRAM COMPLETE    No orders of the defined types were placed in this encounter.    Patient Instructions  Medication Instructions:  Your physician recommends that you continue on your current medications as directed. Please refer to the Current Medication list given to you today.  *If you need a refill on your cardiac medications before your next appointment, please call your pharmacy*   Lab Work: None ordered.  If you have labs (blood work) drawn today and your tests are completely normal, you will receive your results only by: [place] (if you have MyChart) OR A paper copy in the mail If you have any lab test that is abnormal or we need to change your treatment, we will call you to review the results.   Testing/Procedures: Your physician has requested that you have an echocardiogram. Echocardiography is a painless test that uses sound waves to create images of your heart. It provides your doctor with information about the size and shape of your heart and how well your heart's chambers and valves are working. This procedure takes approximately one hour. There are no restrictions for this procedure. Please do NOT wear cologne, perfume, aftershave, or lotions (deodorant is allowed). Please arrive 15 minutes prior to your  appointment time.    Follow-Up: At [facility], you and your health needs are our priority.  As part of our continuing mission to provide you with exceptional heart care, we have created designated Provider Care Teams.  These Care Teams include your primary Cardiologist (physician) and Advanced Practice Providers (APPs -  Physician Assistants and Nurse Practitioners) who all work together to provide you with the care you need, when you need it.  We recommend signing up for the patient portal called "MyChart".  Sign up information is provided on this After Visit Summary.  MyChart is used to connect with patients for Virtual Visits (Telemedicine).  Patients are able to view lab/test results, encounter notes, upcoming appointments, etc.  Non-urgent messages can be sent to your provider as well.   To learn more about what you can do with MyChart, go to [email or web address].    Your next appointment:   12 months with Dr Marlou Porch  Important Information About Sugar          I,Jada Bradford,acting as a scribe for Candee Furbish, MD.,have documented all relevant documentation on the behalf of Candee Furbish, MD,as directed by  Candee Furbish, MD while in the presence of Candee Furbish, MD.  I, Candee Furbish, MD, have reviewed all documentation for this visit. The documentation on 11/14/22 for the exam, diagnosis, procedures, and orders are all accurate and complete.  Signed, Candee Furbish, MD  11/14/2022 9:49 AM    [facility]

## 2022-11-20 ENCOUNTER — Telehealth: Payer: Self-pay | Admitting: Family Medicine

## 2022-11-20 NOTE — Telephone Encounter (Signed)
[facility] [facility] faxed refill request for the following medications:  atorvastatin (LIPITOR) 40 MG tablet    Please advise.

## 2022-11-22 ENCOUNTER — Other Ambulatory Visit: Payer: Self-pay

## 2022-11-22 DIAGNOSIS — E7800 Pure hypercholesterolemia, unspecified: Secondary | ICD-10-CM

## 2022-11-22 MED ORDER — ATORVASTATIN CALCIUM 40 MG PO TABS: 40.0000 mg | ORAL_TABLET | Freq: Every day | ORAL | 0 refills | Status: DC

## 2022-12-06 ENCOUNTER — Ambulatory Visit (HOSPITAL_COMMUNITY): Payer: Medicare Other

## 2022-12-17 ENCOUNTER — Ambulatory Visit (HOSPITAL_COMMUNITY): Payer: Medicare Other | Attending: Cardiology

## 2022-12-17 DIAGNOSIS — I350 Nonrheumatic aortic (valve) stenosis: Secondary | ICD-10-CM | POA: Diagnosis present

## 2022-12-17 LAB — ECHOCARDIOGRAM COMPLETE
AR max vel: 0.93 cm2
AV Area VTI: 0.87 cm2
AV Area mean vel: 0.86 cm2
AV Mean grad: 25 mmHg
AV Peak grad: 41.5 mmHg
Ao pk vel: 3.22 m/s
Area-P 1/2: 2.82 cm2
Calc EF: 57.7 %
P 1/2 time: 381 msec
S' Lateral: 2.9 cm
Single Plane A2C EF: 51.4 %
Single Plane A4C EF: 64.2 %

## 2022-12-18 ENCOUNTER — Telehealth: Payer: Self-pay | Admitting: *Deleted

## 2022-12-18 ENCOUNTER — Telehealth: Payer: Self-pay | Admitting: Cardiology

## 2022-12-18 DIAGNOSIS — I350 Nonrheumatic aortic (valve) stenosis: Secondary | ICD-10-CM

## 2022-12-18 NOTE — Telephone Encounter (Signed)
Patient's daughter is calling back for echo results for patient

## 2022-12-18 NOTE — Telephone Encounter (Signed)
Normal pump function.  Aortic valve does demonstrate severe aortic valve stenosis.  Because of this, lets have him seen by structural heart team to discuss potential TAVR.  Thank you  Candee Furbish, MD   Pt and daughter aware of results and need for consult with structural heart clinic.  They will await a call to be scheduled.

## 2022-12-18 NOTE — Telephone Encounter (Signed)
Pt and daughter aware of results and need for consult with structural heart clinic.  They will await a call to be scheduled.

## 2022-12-24 ENCOUNTER — Ambulatory Visit: Payer: Medicare Other | Attending: Internal Medicine | Admitting: Internal Medicine

## 2022-12-24 ENCOUNTER — Encounter: Payer: Self-pay | Admitting: Internal Medicine

## 2022-12-24 VITALS — BP 126/68 | HR 75 | Ht 68.0 in | Wt 211.8 lb

## 2022-12-24 DIAGNOSIS — I82402 Acute embolism and thrombosis of unspecified deep veins of left lower extremity: Secondary | ICD-10-CM

## 2022-12-24 DIAGNOSIS — I350 Nonrheumatic aortic (valve) stenosis: Secondary | ICD-10-CM

## 2022-12-24 DIAGNOSIS — I63411 Cerebral infarction due to embolism of right middle cerebral artery: Secondary | ICD-10-CM

## 2022-12-24 DIAGNOSIS — R569 Unspecified convulsions: Secondary | ICD-10-CM | POA: Diagnosis not present

## 2022-12-24 NOTE — Patient Instructions (Signed)
Medication Instructions:  No changes *If you need a refill on your cardiac medications before your next appointment, please call your pharmacy*   Lab Work: none   Testing/Procedures: ECHO DUE IN 3 MONTHS Your physician has requested that you have an echocardiogram. Echocardiography is a painless test that uses sound waves to create images of your heart. It provides your doctor with information about the size and shape of your heart and how well your heart's chambers and valves are working. This procedure takes approximately one hour. There are no restrictions for this procedure. Please do NOT wear cologne, perfume, aftershave, or lotions (deodorant is allowed). Please arrive 15 minutes prior to your appointment time.   Follow-Up: 3 months with Dr. Ali Lowe, echocardiogram prior

## 2022-12-24 NOTE — Progress Notes (Signed)
Patient ID: Travis Salazar. MRN: [id number] DOB/AGE: October 27, 1936 87 y.o.  Primary Care Physician:Gilbert, Retia Passe., MD Primary Cardiologist: Candee Furbish, MD   FOCUSED CARDIOVASCULAR PROBLEM LIST:   1.  Paradoxical low-flow low gradient aortic stenosis with an aortic valve area of 0.87 cm grade, mean gradient 25 mmHg, and peak velocity of 3.2 m/s with a stroke-volume index of 33 cc/m: EKG demonstrates sinus rhythm without conduction deficits 2.  Hypertension 3.  Hyperlipidemia 4.  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  Right upper lobe pulmonary nodule/mass thought to be inflammatory 6.  Prior PE 2022 with DVT October 2023 currently on Eliquis 7.  Frailty and ambulates with cane   HISTORY OF PRESENT ILLNESS: The patient is a 87 y.o. male with the indicated medical history here for recommendations regarding his aortic valvular disease.  He was seen by Dr. Marlou Porch recently.  He was doing well at that appointment.  An echocardiogram was done which demonstrated progressive aortic valvular disease.  The patient is here with her daughter.  He is doing fairly well.  He denies any significant shortness of breath, chest pain, presyncope, syncope, or paroxysmal nocturnal dyspnea.  He does not do much in a regular day.  He will sometimes tinker with things in his workshop however his vision has gotten worse likely from macular degeneration so he is no longer able to do this.  He ambulates with a cane.  He does not do much physically in a day.  He fortunately has not required any emergency room visits or hospitalizations.  He has had no significant bleeding or bruising episodes while on Eliquis.  He denies any easy fatigability.  He lives in a one-story home and does not tire out with any of his activities of daily living.  He has not seen a dentist in many many years.  Past Medical History:  Diagnosis Date   Hyperlipidemia    Hypertension    Seizures ([place])    Stroke [facility])      Past Surgical History:  Procedure Laterality Date   LIPOMA EXCISION     neck   PROSTATECTOMY     due to prostate cancer    Family History  Problem Relation Age of Onset   COPD Mother    Heart attack Father     Social History   Socioeconomic History   Marital status: Married    Spouse name: [name]   Number of children: 4   Years of education: 10th grade   Highest education level: 9th grade  Occupational History   Occupation: retired  Tobacco Use   Smoking status: Former    Packs/day: 1.00    Years: 50.00    Total pack years: 50.00    Types: Cigarettes    Quit date: 12/15/2009    Years since quitting: 13.0   Smokeless tobacco: Never  Vaping Use   Vaping Use: Never used  Substance and Sexual Activity   Alcohol use: No    Alcohol/week: 0.0 standard drinks of alcohol   Drug use: No   Sexual activity: Not on file  Other Topics Concern   Not on file  Social History Narrative   ** Merged History Encounter ** Right handed    Caffeine 2 cups daily   Home is one story   Social Determinants of Health   Financial Resource Strain: Low Risk  (04/08/2022)   Overall Financial Resource Strain (CARDIA)    Difficulty of Paying Living Expenses: Not hard  at all  Food Insecurity: No Food Insecurity (04/08/2022)   Hunger Vital Sign    Worried About Running Out of Food in the Last Year: Never true    Ran Out of Food in the Last Year: Never true  Transportation Needs: No Transportation Needs (04/08/2022)   PRAPARE - Hydrologist (Medical): No    Lack of Transportation (Non-Medical): No  Physical Activity: Sufficiently Active (04/08/2022)   Exercise Vital Sign    Days of Exercise per Week: 7 days    Minutes of Exercise per Session: 30 min  Stress: No Stress Concern Present (04/08/2022)   [place]    Feeling of Stress : Not at all  Social Connections: Moderately Isolated (04/08/2022)    Social Connection and Isolation Panel [NHANES]    Frequency of Communication with Friends and Family: More than three times a week    Frequency of Social Gatherings with Friends and Family: Once a week    Attends Religious Services: More than 4 times per year    Active Member of [employer] or Organizations: No    Attends Archivist Meetings: Never    Marital Status: Widowed  Intimate Partner Violence: Not At Risk (04/08/2022)   Humiliation, Afraid, Rape, and Kick questionnaire    Fear of Current or Ex-Partner: No    Emotionally Abused: No    Physically Abused: No    Sexually Abused: No     Prior to Admission medications   Medication Sig Start Date End Date Taking? Authorizing Provider  allopurinol (ZYLOPRIM) 300 MG tablet Take 300 mg by mouth daily as needed.    [provider]  amLODipine (NORVASC) 10 MG tablet TAKE 1 TABLET BY MOUTH DAILY 08/01/22   [name], MD  atorvastatin (LIPITOR) 40 MG tablet Take 1 tablet (40 mg total) by mouth daily at 6 PM. 11/22/22   Ostwalt, Letitia Libra, PA-C  bicalutamide (CASODEX) 50 MG tablet Take 50 mg by mouth daily. 02/07/22   [provider]  diazePAM, 15 MG Dose, (VALTOCO 15 MG DOSE) 2 x 7.5 MG/0.1ML LQPK Spray in each nostril as needed for seizure. May use second dose after 4 hours if needed. 04/02/22   Cameron Sprang, MD  ELIQUIS 5 MG TABS tablet TAKE 1 TABLET BY MOUTH TWICE  DAILY 06/05/22   [name], MD  lacosamide (VIMPAT) 50 MG TABS tablet Take 1 tablet (50 mg total) by mouth 2 (two) times daily. 07/22/22   Cameron Sprang, MD  levETIRAcetam (KEPPRA) 1000 MG tablet TAKE 2 TABLETS BY MOUTH TWICE  DAILY 08/29/22   Cameron Sprang, MD  metoprolol tartrate (LOPRESSOR) 25 MG tablet TAKE ONE-HALF TABLET BY MOUTH  TWICE DAILY 10/01/22   Birdie Sons, MD  mirtazapine (REMERON SOL-TAB) 30 MG disintegrating tablet Take 1 tablet (30 mg total) by mouth at bedtime. 08/01/22   [name], MD  tamsulosin  (FLOMAX) 0.4 MG CAPS capsule Take 0.4 mg by mouth daily. 09/21/21   [provider]    No Known Allergies  REVIEW OF SYSTEMS:  General: no fevers/chills/night sweats Eyes: no blurry vision, diplopia, or amaurosis ENT: no sore throat or hearing loss Resp: no cough, wheezing, or hemoptysis CV: no edema or palpitations GI: no abdominal pain, nausea, vomiting, diarrhea, or constipation GU: no dysuria, frequency, or hematuria Skin: no rash Neuro: no headache, numbness, tingling, or weakness of extremities Musculoskeletal: no joint  pain or swelling Heme: no bleeding, DVT, or easy bruising Endo: no polydipsia or polyuria  BP 126/68   Pulse 75   Ht '5\' 8"'$  (1.727 m)   Wt 211 lb 12.8 oz (96.1 kg)   SpO2 97%   BMI 32.20 kg/m   PHYSICAL EXAM: GEN:  AO x 3 in no acute distress HEENT: normal Dentition: Poor Neck: JVP normal. +2 carotid upstrokes without bruits. No thyromegaly. Lungs: equal expansion, clear bilaterally CV: Apex is discrete and nondisplaced, RRR with 2/6 SEM Abd: soft, non-tender, non-distended; no bruit; positive bowel sounds Ext: no edema, ecchymoses, or cyanosis Vascular: 2+ femoral pulses, 2+ radial pulses       Skin: warm and dry without rash Neuro: CN II-XII grossly intact; motor and sensory grossly intact    DATA AND STUDIES:  EKG: Sinus rhythm with first-degree AV block and no bundle-branch blocks.  2D ECHO: January 2024  1. Left ventricular ejection fraction, by estimation, is 60 to 65%. The  left ventricle has normal function. The left ventricle has no regional  wall motion abnormalities. Left ventricular diastolic parameters are  consistent with Grade I diastolic  dysfunction (impaired relaxation).   2. Right ventricular systolic function is normal. The right ventricular  size is normal. There is normal pulmonary artery systolic pressure. The  estimated right ventricular systolic pressure is 09.3 mmHg.   3. The mitral valve is normal in  structure. Trivial mitral valve  regurgitation. No evidence of mitral stenosis.   4. The aortic valve is calcified. Aortic valve regurgitation is mild.  Severe aortic valve stenosis. Aortic valve area, by VTI measures 0.87 cm.  Aortic valve mean gradient measures 25.0 mmHg. Aortic valve Vmax measures  3.22 m/s. DVI 0.22 and SVI 33.  Although the mean SVG and peak velocity are more consistent with moderate  AS, the SVI and DIV are low. Findings consistent with paradoxical low flow  low gradient aortic stenosis.   5. The inferior vena cava is normal in size with greater than 50%  respiratory variability, suggesting right atrial pressure of 3 mmHg.   6. Compared to echo dated 05/28/2021, the mean AVG has increased from 24  to 53mHg, DVI has decreased from 0.36 to 0.22, SVI has decreased from 38  to 33, VMax has increased from 2.9 m/s to 3.2100m and AVA has decreased  from 1cm2 to 0.87cm2 (VTI).   CARDIAC CATH: n/a  STS RISK CALCULATOR: pending  NHYA CLASS: 1    ASSESSMENT AND PLAN:   Nonrheumatic aortic valve stenosis - Plan: EKG 12-Lead, ECHOCARDIOGRAM COMPLETE  Deep vein thrombosis (DVT) of left lower extremity, unspecified chronicity, unspecified vein (HCC)  Seizure (HCC)  Cerebrovascular accident (CVA) due to embolism of right middle cerebral artery ([place] The patient has developed paradoxical low-flow low gradient aortic stenosis stage D3.  He however is asymptomatic.  This is likely due to his low functional capacity from macular degeneration, and gait instability from a prior stroke.  A long conversation with the patient and his daughter about management strategies including starting an evaluation now including a CT scan and a dental evaluation versus watchful waiting.  At this point in time the patient would like to defer any evaluation since he is feeling relatively well.  I will see him back in 3 months with an echocardiogram to evaluate further.  I have asked him to contact  our structural heart disease office if he were to develop any worsening dyspnea, chest pain, presyncope, or syncope.  He  understands this plan and agrees with it.  I have personally reviewed the patients imaging data as summarized above.  I have reviewed the natural history of aortic stenosis with the patient and family members who are present today. We have discussed the limitations of medical therapy and the poor prognosis associated with symptomatic aortic stenosis. We have also reviewed potential treatment options, including palliative medical therapy, conventional surgical aortic valve replacement, and transcatheter aortic valve replacement. We discussed treatment options in the context of this patient's specific comorbid medical conditions.   All of the patient's questions were answered today. Will make further recommendations based on the results of studies outlined above.   Total time spent with patient today 60 minutes. This includes reviewing records, evaluating the patient and coordinating care.   Early Osmond, MD  12/24/2022 12:03 PM    [address], [place], [place]  [id number] Phone: [phone]; Fax: [phone]

## 2023-01-01 ENCOUNTER — Other Ambulatory Visit: Payer: Self-pay | Admitting: Physician Assistant

## 2023-01-01 DIAGNOSIS — E7800 Pure hypercholesterolemia, unspecified: Secondary | ICD-10-CM

## 2023-01-03 ENCOUNTER — Telehealth: Payer: Self-pay | Admitting: Family Medicine

## 2023-01-03 ENCOUNTER — Other Ambulatory Visit: Payer: Self-pay

## 2023-01-03 DIAGNOSIS — E7800 Pure hypercholesterolemia, unspecified: Secondary | ICD-10-CM

## 2023-01-03 MED ORDER — ATORVASTATIN CALCIUM 40 MG PO TABS: 40.0000 mg | ORAL_TABLET | Freq: Every day | ORAL | 0 refills | Status: DC

## 2023-01-03 NOTE — Telephone Encounter (Signed)
Refill sent.  KP

## 2023-01-03 NOTE — Telephone Encounter (Signed)
[facility] faxed refill request for the following medications:  atorvastatin (LIPITOR) 40 MG tablet    Please advise.

## 2023-01-21 ENCOUNTER — Other Ambulatory Visit: Payer: Self-pay

## 2023-01-21 MED ORDER — APIXABAN 5 MG PO TABS: 5.0000 mg | ORAL_TABLET | Freq: Two times a day (BID) | ORAL | 3 refills | Status: DC

## 2023-01-21 NOTE — Telephone Encounter (Signed)
Received refill request from [facility] on the following medication:  Eliquis Tab 5 mg tab.  Have pended prescription for you.

## 2023-01-27 ENCOUNTER — Other Ambulatory Visit: Payer: Self-pay | Admitting: Neurology

## 2023-02-03 ENCOUNTER — Telehealth: Payer: Self-pay | Admitting: Family Medicine

## 2023-02-03 NOTE — Telephone Encounter (Signed)
[facility] faxed refill request for the following medications:  amLODipine (NORVASC) 10 MG tablet    Please advise.

## 2023-02-04 ENCOUNTER — Other Ambulatory Visit: Payer: Self-pay

## 2023-02-04 MED ORDER — LACOSAMIDE 50 MG PO TABS: 50.0000 mg | ORAL_TABLET | Freq: Two times a day (BID) | ORAL | 3 refills | Status: DC

## 2023-02-04 NOTE — Telephone Encounter (Signed)
Pt coming in for appt tomorrow.  Daughter would like to see if all medication are still needed and working correctly prior to refill.

## 2023-02-04 NOTE — Progress Notes (Unsigned)
I,Joseline E Rosas,acting as a scribe for [employer], MD.,have documented all relevant documentation on the behalf of Eulis Foster, MD,as directed by  Eulis Foster, MD while in the presence of Eulis Foster, MD.   Established patient visit   Patient: Travis Salazar.   DOB: 04/16/1936   87 y.o. Male  MRN: [id number] Visit Date: 02/05/2023  Today's healthcare provider: Eulis Foster, MD   Chief Complaint  Patient presents with   Follow-up   Subjective    HPI Patient here with daughter Travis Salazar.  Hypertension, follow-up  BP Readings from Last 3 Encounters:  02/05/23 136/78  12/24/22 126/68  11/14/22 120/70   Wt Readings from Last 3 Encounters:  02/05/23 220 lb (99.8 kg)  12/24/22 211 lb 12.8 oz (96.1 kg)  11/14/22 210 lb (95.3 kg)     He was last seen for hypertension 5 months ago.  BP at that visit was 149/77. Management since that visit includes continuing amlodipine 31m and metoprolol 283m  He reports excellent compliance with treatment.  Outside blood pressures are not being checked.  Pertinent labs Lab Results  Component Value Date   CHOL 187 08/29/2020   HDL 40 08/29/2020   LDLCALC 125 (H) 08/29/2020   LDLDIRECT 89 02/05/2023   TRIG 89 08/14/2021   CHOLHDL 4.7 08/29/2020   Lab Results  Component Value Date   NA 132 (L) 03/24/2022   K 5.3 (H) 03/24/2022   CREATININE 0.94 03/24/2022   GFRNONAA >60 03/24/2022   GLUCOSE 103 (H) 03/24/2022   TSH 4.040 08/29/2020     The ASCVD Risk score (Arnett DK, et al., 2019) failed to calculate for the following reasons:   The 2019 ASCVD risk score is only valid for ages 4034o 7943 The patient has a prior MI or stroke diagnosis  --------------------------------------------------------------------------------------------------- Lipid/Cholesterol, Follow-up  Last lipid panel Other pertinent labs  Lab Results  Component Value Date   CHOL 187 08/29/2020    HDL 40 08/29/2020   LDLCALC 125 (H) 08/29/2020   LDLDIRECT 89 02/05/2023   TRIG 89 08/14/2021   CHOLHDL 4.7 08/29/2020   Lab Results  Component Value Date   ALT 11 01/20/2022   AST 20 01/20/2022   PLT 137 (L) 03/24/2022   TSH 4.040 08/29/2020     He was last seen for this 5 months ago.  Management since that visit includes continue atorvastatin 402maily   He reports excellent compliance with treatment.   The ASCVD Risk score (Arnett DK, et al., 2019) failed to calculate for the following reasons:   The 2019 ASCVD risk score is only valid for ages 40 36 79 22The patient has a prior MI or stroke diagnosis  -------------------------------------------------------------------------------------------------- The patient and his family would like to discuss decreasing medications.   Decreased appetitie: he was started on mirtazepine sometime in 2023 and would like to gradually taper off of this medication   Gout: patient has gout episode over one year ago   BPH: has been on flomax, denies any urinary issues     Medications: Outpatient Medications Prior to Visit  Medication Sig   allopurinol (ZYLOPRIM) 300 MG tablet Take 300 mg by mouth daily as needed.   diazePAM, 15 MG Dose, (VALTOCO 15 MG DOSE) 2 x 7.5 MG/0.1ML LQPK Spray in each nostril as needed for seizure. May use second dose after 4 hours if needed.   lacosamide (VIMPAT) 50 MG TABS tablet Take 1 tablet (50 mg total) by  mouth 2 (two) times daily.   levETIRAcetam (KEPPRA) 1000 MG tablet TAKE 2 TABLETS BY MOUTH TWICE  DAILY   [DISCONTINUED] amLODipine (NORVASC) 10 MG tablet TAKE 1 TABLET BY MOUTH DAILY   [DISCONTINUED] apixaban (ELIQUIS) 5 MG TABS tablet Take 1 tablet (5 mg total) by mouth 2 (two) times daily.   [DISCONTINUED] atorvastatin (LIPITOR) 40 MG tablet Take 1 tablet (40 mg total) by mouth daily at 6 PM.   [DISCONTINUED] metoprolol tartrate (LOPRESSOR) 25 MG tablet TAKE ONE-HALF TABLET BY MOUTH  TWICE DAILY    [DISCONTINUED] mirtazapine (REMERON SOL-TAB) 30 MG disintegrating tablet Take 1 tablet (30 mg total) by mouth at bedtime.   [DISCONTINUED] tamsulosin (FLOMAX) 0.4 MG CAPS capsule Take 0.4 mg by mouth daily.   No facility-administered medications prior to visit.    Review of Systems  Constitutional:  Negative for fatigue.  Eyes:  Negative for visual disturbance.  Respiratory:  Negative for chest tightness, shortness of breath and wheezing.   Cardiovascular:  Negative for chest pain, palpitations and leg swelling.  Gastrointestinal:  Negative for abdominal pain and blood in stool.  Neurological:  Negative for dizziness, weakness and light-headedness.       Objective    BP 136/78 (BP Location: Left Arm)   Pulse 71   Resp 16   Ht 5' 8"$  (1.727 m)   Wt 220 lb (99.8 kg)   BMI 33.45 kg/m    Physical Exam Vitals reviewed.  Constitutional:      General: He is not in acute distress.    Appearance: Normal appearance. He is not ill-appearing, toxic-appearing or diaphoretic.  Eyes:     Conjunctiva/sclera: Conjunctivae normal.  Cardiovascular:     Rate and Rhythm: Normal rate and regular rhythm.     Pulses: Normal pulses.     Heart sounds: Murmur heard.     No friction rub. No gallop.  Pulmonary:     Effort: Pulmonary effort is normal. No respiratory distress.     Breath sounds: Normal breath sounds. No stridor. No wheezing, rhonchi or rales.  Abdominal:     General: Bowel sounds are normal. There is no distension.     Palpations: Abdomen is soft.     Tenderness: There is no abdominal tenderness.  Musculoskeletal:     Right lower leg: Edema present.     Left lower leg: Edema present.     Comments: Trace edema  Skin:    Findings: No erythema or rash.  Neurological:     Mental Status: He is alert and oriented to person, place, and time.       Results for orders placed or performed in visit on 02/05/23  Direct LDL  Result Value Ref Range   LDL Direct 89 0 - 99 mg/dL  Uric  acid  Result Value Ref Range   Uric Acid 4.8 3.8 - 8.4 mg/dL    Assessment & Plan     Problem List Items Addressed This Visit       Cardiovascular and Mediastinum   Essential (primary) hypertension (Chronic)    Chronic  Controlled  He will continue amlodipine 64m daily  and metoprolol 249mBID  Refills provided today        Relevant Medications   metoprolol tartrate (LOPRESSOR) 25 MG tablet   amLODipine (NORVASC) 10 MG tablet   apixaban (ELIQUIS) 5 MG TABS tablet   atorvastatin (LIPITOR) 40 MG tablet   Acute pulmonary embolism without acute cor pulmonale (HCC)    Chronic issue  He will continue eliquis 24m BID  No changes today       Relevant Medications   metoprolol tartrate (LOPRESSOR) 25 MG tablet   amLODipine (NORVASC) 10 MG tablet   apixaban (ELIQUIS) 5 MG TABS tablet   atorvastatin (LIPITOR) 40 MG tablet   Essential hypertension - Primary   Relevant Medications   metoprolol tartrate (LOPRESSOR) 25 MG tablet   amLODipine (NORVASC) 10 MG tablet   apixaban (ELIQUIS) 5 MG TABS tablet   atorvastatin (LIPITOR) 40 MG tablet     Other   Hypercholesterolemia    Chronic  Last check in 2020 was above goal  He will continue atorvastatin 452mdaily  We will measure direct LDL today        Relevant Medications   metoprolol tartrate (LOPRESSOR) 25 MG tablet   amLODipine (NORVASC) 10 MG tablet   apixaban (ELIQUIS) 5 MG TABS tablet   atorvastatin (LIPITOR) 40 MG tablet   Other Relevant Orders   Direct LDL (Completed)   Idiopathic chronic gout of multiple sites without tophus    Chronic  No recent flares  We will make sure uric acid is in therapeutic range prior to stopping allopurinol  If less than 6, will stop allopurinol       Relevant Orders   Uric acid (Completed)   Decreased appetite    This problem has been improved  He has been on mirtazapine 3090mor over 1 year, per family  We will decrease the mirtazapine to 80m41mily for four weeks and then  discontinue this medication  Refills for 80mg80mlets prescribed today       Relevant Medications   mirtazapine (REMERON SOL-TAB) 30 MG disintegrating tablet     Return in about 4 weeks (around 03/05/2023) for med taper .        The entirety of the information documented in the History of Present Illness, Review of Systems and Physical Exam were personally obtained by me. Portions of this information were initially documented by JoselLyndel Pleasure and reviewed by me for thoroughness and accuracy.MakieEulis Foster    MakieEulis Foster [phone]) [phone])  [facility]

## 2023-02-05 ENCOUNTER — Other Ambulatory Visit: Payer: Self-pay | Admitting: Family Medicine

## 2023-02-05 ENCOUNTER — Ambulatory Visit (INDEPENDENT_AMBULATORY_CARE_PROVIDER_SITE_OTHER): Payer: Medicare HMO | Admitting: Family Medicine

## 2023-02-05 ENCOUNTER — Encounter: Payer: Self-pay | Admitting: Family Medicine

## 2023-02-05 VITALS — BP 136/78 | HR 71 | Resp 16 | Ht 68.0 in | Wt 220.0 lb

## 2023-02-05 DIAGNOSIS — I2699 Other pulmonary embolism without acute cor pulmonale: Secondary | ICD-10-CM | POA: Diagnosis not present

## 2023-02-05 DIAGNOSIS — R630 Anorexia: Secondary | ICD-10-CM

## 2023-02-05 DIAGNOSIS — M1A09X0 Idiopathic chronic gout, multiple sites, without tophus (tophi): Secondary | ICD-10-CM | POA: Diagnosis not present

## 2023-02-05 DIAGNOSIS — E7800 Pure hypercholesterolemia, unspecified: Secondary | ICD-10-CM

## 2023-02-05 DIAGNOSIS — I10 Essential (primary) hypertension: Secondary | ICD-10-CM | POA: Diagnosis not present

## 2023-02-05 MED ORDER — AMLODIPINE BESYLATE 10 MG PO TABS: 10.0000 mg | ORAL_TABLET | Freq: Every day | ORAL | 3 refills | Status: DC

## 2023-02-05 MED ORDER — METOPROLOL TARTRATE 25 MG PO TABS: 12.5000 mg | ORAL_TABLET | Freq: Two times a day (BID) | ORAL | 3 refills | Status: DC

## 2023-02-05 MED ORDER — ATORVASTATIN CALCIUM 40 MG PO TABS: 40.0000 mg | ORAL_TABLET | Freq: Every day | ORAL | 3 refills | Status: DC

## 2023-02-05 MED ORDER — APIXABAN 5 MG PO TABS: 5.0000 mg | ORAL_TABLET | Freq: Two times a day (BID) | ORAL | 3 refills | Status: DC

## 2023-02-05 MED ORDER — MIRTAZAPINE 30 MG PO TBDP: 15.0000 mg | ORAL_TABLET | Freq: Every day | ORAL | 0 refills | Status: DC

## 2023-02-05 NOTE — Patient Instructions (Addendum)
Please decrease the dose of the mirtazepine to 20m daily for the next 4 weeks   We will check your cholesterol and uric acid levels   If your uric acid levels are normal, we will call to tell you to stop the allopurinol   Please discuss the Eliquis with your cardiologist at your upcoming appt in May.   I have sent in refills for you medications

## 2023-02-06 LAB — URIC ACID: Uric Acid: 4.8 mg/dL (ref 3.8–8.4)

## 2023-02-06 LAB — LDL CHOLESTEROL, DIRECT: LDL Direct: 89 mg/dL (ref 0–99)

## 2023-02-06 NOTE — Assessment & Plan Note (Addendum)
Chronic  Controlled  He will continue amlodipine '10mg'$  daily  and metoprolol 12.'5mg'$  BID  Refills provided today

## 2023-02-06 NOTE — Assessment & Plan Note (Signed)
This problem has been improved  He has been on mirtazapine 60m for over 1 year, per family  We will decrease the mirtazapine to 181mdaily for four weeks and then discontinue this medication  Refills for 1522mablets prescribed today

## 2023-02-06 NOTE — Assessment & Plan Note (Signed)
Chronic issue  He will continue eliquis 31m BID  No changes today

## 2023-02-06 NOTE — Assessment & Plan Note (Signed)
Chronic  Last check in 2020 was above goal  He will continue atorvastatin 76m daily  We will measure direct LDL today

## 2023-02-06 NOTE — Assessment & Plan Note (Signed)
Chronic  No recent flares  We will make sure uric acid is in therapeutic range prior to stopping allopurinol  If less than 6, will stop allopurinol

## 2023-02-06 NOTE — Telephone Encounter (Signed)
Requested medication (s) are due for refill today: yes  Requested medication (s) are on the active medication list: yes  Last refill:  02/05/23  Future visit scheduled: yes  Notes to clinic:  Pharmacy comment: Cannot cut ODT (soluable tabs) in half for Remeron 35m ODT dated 02/05/23. Ok Remeron ODT 168mtabs??      Requested Prescriptions  Pending Prescriptions Disp Refills   mirtazapine (REMERON SOL-TAB) 30 MG disintegrating tablet [Pharmacy Med Name: MIRTAZAPINE (REMERON SOL-TAB) 30 MG] 30 tablet 0    Sig: Take 0.5 tablets (15 mg total) by mouth at bedtime.     Psychiatry: Antidepressants - mirtazapine Passed - 02/05/2023  7:54 PM      Passed - Valid encounter within last 6 months    Recent Outpatient Visits           Yesterday Essential hypertension   [facility]   5 months ago Essential hypertension   [facility]   9 months ago Essential hypertension   [facility]   11 months ago Weight loss, unintentional   [facility]   [facility]  [facility]

## 2023-02-07 ENCOUNTER — Other Ambulatory Visit: Payer: Self-pay | Admitting: Family Medicine

## 2023-02-07 MED ORDER — MIRTAZAPINE 15 MG PO TBDP: 15.0000 mg | ORAL_TABLET | Freq: Every day | ORAL | 1 refills | Status: DC

## 2023-02-10 ENCOUNTER — Telehealth: Payer: Self-pay | Admitting: Family Medicine

## 2023-02-10 NOTE — Telephone Encounter (Signed)
Contacted [employer]. to schedule their annual wellness visit. Appointment made for 04/15/2023.  [place] Direct Dial: [phone]

## 2023-02-11 ENCOUNTER — Telehealth: Payer: Self-pay | Admitting: Family Medicine

## 2023-02-11 NOTE — Telephone Encounter (Signed)
Barnett Applebaum with [facility] is calling on behalf of the patient. Barnett Applebaum says patient was prescribed mirtazapine (REMERON SOL-TAB) 15 MG disintegrating tablet in 30 MG. Pharmacist wants to know the correct dosage for the medication since the instructions say to take 5 tablets, but the tablets cannot be cut in half. Please follow up with Gina at [phone] option 2 and use reference number: [id number].

## 2023-02-11 NOTE — Telephone Encounter (Signed)
Prescription for '15mg'$  tablets were sent to [facility] on 02/07/23.  Please instruct patient to take '15mg'$  mirtazapine daily.

## 2023-02-12 NOTE — Telephone Encounter (Signed)
Called [facility] and spoke with pharmacist tech. Reports that the '30mg'$  cannot be cut. I told the pharmacist that a '15mg'$  tab was sent in and she confirmed and also stated she was going to canceled the 30 mg since the '15mg'$  was the correct one and had been shipped.

## 2023-02-13 DIAGNOSIS — H401133 Primary open-angle glaucoma, bilateral, severe stage: Secondary | ICD-10-CM | POA: Diagnosis not present

## 2023-02-17 ENCOUNTER — Ambulatory Visit: Payer: Self-pay

## 2023-02-17 NOTE — Telephone Encounter (Signed)
Please call to instruct patient to take 12.'5mg'$  twice daily of metoprolol.   Eulis Foster, MD  [facility]

## 2023-02-17 NOTE — Telephone Encounter (Signed)
  Chief Complaint: Unclear proper dose of Metoprolol Symptoms: non Frequency:  Pertinent Negatives: Patient denies  Disposition: '[]'$ ED /'[]'$ Urgent Care (no appt availability in office) / '[]'$ Appointment(In office/virtual)/ '[]'$  [facility]/ '[]'$ Home Care/ '[]'$ Refused Recommended Disposition /'[]'$ [facility]/ '[]'$  Follow-up with PCP Additional Notes: Pts daughter Lanney Gins called regarding medication dosage.  Per note from OV pt is to take '25mg'$  bid of metoprolol.   Per Rx written pt is to take 12.'5mg'$  BID.  See below. Please advise.     Rx information:  metoprolol tartrate (LOPRESSOR) 25 MG tablet [id number]   Order Details Dose: 12.5 mg Route: Oral Frequency: 2 times daily  Dispense Quantity: 90 tablet Refills: 3   Note to Pharmacy: Please send a replace/new response with 100-Day Supply if appropriate to maximize member benefit. Requesting 1 year supply.       Sig: Take 0.5 tablets (12.5 mg total) by mouth 2 (two) times daily.       Start Date: 02/05/23 End Date: --  Written Date: 02/05/23 Expiration Date: 02/05/24     Associated Diagnoses: Essential hypertension [I10]  Original Order: metoprolol tartrate (LOPRESSOR) 25 MG tablet [id number]  Providers   Authorizing Provider: Eulis Foster, [address], [place] [address] Phone: [phone]   Fax: [phone] DEA #: [id number]   NPI: [id number]      Notes from encounter:  Problem: Essential (primary) hypertension  Editor: Eulis Foster, MD (Physician)              Chronic  Controlled  He will continue amlodipine '10mg'$  daily  and metoprolol '25mg'$  BID  Refills provided today        Summary: Medication Management   Pts daughter is calling to ask has the script for metoprolol tartrate (LOPRESSOR) 25 MG tablet [id number] been updated? Pt was previously on 1 tablet. Now script is a 1/2 tablet. Please advise     Reason for Disposition  [1] Caller has URGENT  medicine question about med that PCP or specialist prescribed AND [2] triager unable to answer question  Answer Assessment - Initial Assessment Questions 1. NAME of MEDICINE: "What medicine(s) are you calling about?"     Metoprolol  2. QUESTION: "What is your question?" (e.g., double dose of medicine, side effect)     How much should pt be taking? 3. PRESCRIBER: "Who prescribed the medicine?" Reason: if prescribed by specialist, call should be referred to that group.     Dr. Quentin Cornwall  Protocols used: Medication Question Call-A-AH

## 2023-02-17 NOTE — Telephone Encounter (Signed)
Patient's daughter [name] advised.

## 2023-02-18 ENCOUNTER — Ambulatory Visit: Payer: Medicare HMO | Admitting: Neurology

## 2023-02-18 ENCOUNTER — Encounter: Payer: Self-pay | Admitting: Neurology

## 2023-02-18 VITALS — BP 141/77 | HR 77 | Ht 68.0 in | Wt 222.2 lb

## 2023-02-18 DIAGNOSIS — G40201 Localization-related (focal) (partial) symptomatic epilepsy and epileptic syndromes with complex partial seizures, not intractable, with status epilepticus: Secondary | ICD-10-CM

## 2023-02-18 MED ORDER — LACOSAMIDE 50 MG PO TABS: 50.0000 mg | ORAL_TABLET | Freq: Two times a day (BID) | ORAL | 3 refills | Status: DC

## 2023-02-18 MED ORDER — LEVETIRACETAM 1000 MG PO TABS: 2000.0000 mg | ORAL_TABLET | Freq: Two times a day (BID) | ORAL | 3 refills | Status: DC

## 2023-02-18 NOTE — Progress Notes (Signed)
Medication Samples have been provided to the patient.  Drug name: vimpat       Strength: '50mg'$         Qty: 2 box  LOT: [id number]  Exp.Date: 7/24,1/26  Dosing instructions: take a directed   The patient has been instructed regarding the correct time, dose, and frequency of taking this medication, including desired effects and most common side effects.

## 2023-02-18 NOTE — Patient Instructions (Signed)
Good to see you doing well. Continue Levetiracetam '1000mg'$ : take 2 tablets twice a day and Lacosamide '50mg'$ : take 1 tablet twice a day. Please let me know if any issues with refills. Follow-up in 6 months, call for any changes.    Seizure Precautions: 1. If medication has been prescribed for you to prevent seizures, take it exactly as directed.  Do not stop taking the medicine without talking to your doctor first, even if you have not had a seizure in a long time.   2. Avoid activities in which a seizure would cause danger to yourself or to others.  Don't operate dangerous machinery, swim alone, or climb in high or dangerous places, such as on ladders, roofs, or girders.  Do not drive unless your doctor says you may.  3. If you have any warning that you may have a seizure, lay down in a safe place where you can't hurt yourself.    4.  No driving for 6 months from last seizure, as per [facility].   Please refer to the following link on the [facility] website for more information: http://[email or web address].org/[email or web address]/driving/drivingu.cfm   5.  Maintain good sleep hygiene. Avoid alcohol.  6.  Contact your doctor if you have any problems that may be related to the medicine you are taking.  7.  Call 911 and bring the patient back to the ED if:        A.  The seizure lasts longer than 5 minutes.       B.  The patient doesn't awaken shortly after the seizure  C.  The patient has new problems such as difficulty seeing, speaking or moving  D.  The patient was injured during the seizure  E.  The patient has a temperature over 102 F (39C)  F.  The patient vomited and now is having trouble breathing

## 2023-02-18 NOTE — Progress Notes (Signed)
NEUROLOGY FOLLOW UP OFFICE NOTE  Travis Salazar [id number] 12-30-1935  HISTORY OF PRESENT ILLNESS: I had the pleasure of seeing Travis Salazar. in follow-up in the neurology clinic on 02/18/2023.  The patient was last seen 7 months ago for seizures. He is again accompanied by his daughter [name] who helps supplement the history today.  Records and images were personally reviewed where available.  He is on Levetiracetam '2000mg'$  BID and Lacosamide '50mg'$  BID without side effects. He ran out of Lacosamide last night. They are happy to report that he has been seizure-free since addition of Lacosamide in April 2023, no focal seizures since 03/24/22. No staring/unresponsive episodes. He denies any facial/arm twitching, olfactory/gustatory hallucinations, focal numbness/tingling/weakness, myoclonic jerks. No headaches, dizziness, no falls. He has more vision issues and has been told glaucoma is worse, he is seeing a glaucoma specialist this week. Sleep and mood are good. His other daughter [name] manages medications.    History on Initial Assessment 09/20/2021: This is an 87 year old right-handed man with a history of hypertension, hyperlipidemia, PE on Eliquis, prostate cancer, prior R MCA stroke, presenting to establish care for new onset seizures. Records from his prior neurologist Dr. Leonie Man and hospital records were reviewed. His last visit with [facility] was in 11/2019. He was admitted to [facility] on 08/13/21, He recalls feeling dizzy, "something just was not right, he could talk and told his wife to call EMS and words were slurred. EMS notes indicated left-sided weakness with note of left facial and arm twitching on EMS arrival.  Head CT no acute changes, he was started on Dilantin and given repeated doses of Ativan due to continued focal seizure activity. As seizure activity increased, he was unable to move left arm at all, able to lift right arm without drift. EEG showed LPDs on the right hemisphere, maximal right  temporal region with some evolution in frequency but no definite seizure. Background showed diffuse slowing and lateralized right hemisphere slowing. MRI brain no acute changes, right MCA encephalomalacia with chronic blood products. Follow-up EEG showed right temporal sharp waves. He had an MRI brain on 08/22/21 for vision changes, MRI brain no change from prior. He had an extensive DVT and known right lower lobe lung mass which he declined further workup on.   He lives with his wife, his daughter has been staying with them since hospitalization, managing medications. She denies any further seizures since 08/13/21, no staring/unresponsive episodes. He feels his left leg is 90% better. He has glaucoma with blurred vision and bilateral loss of peripheral vision. He denies any olfactory/gustatory hallucinations, deja vu, rising epigastric sensation, focal tingling/weakness, myoclonic jerks. He denies any headaches, dizziness, neck/back pain, bowel/bladder dysfunction. He has been using a cane since hospital discharge, no falls. Sleep is good. No side effects on Levetiracetam '1000mg'$  BID. Mood is good, however his daughter reports he is "moody."   Epilepsy Risk Factors:  right MCA encephalomalacia. Otherwise he had a normal birth and early development.  There is no history of febrile convulsions, CNS infections such as meningitis/encephalitis, significant traumatic brain injury, neurosurgical procedures, or family history of seizures.   PAST MEDICAL HISTORY: Past Medical History:  Diagnosis Date   Hyperlipidemia    Hypertension    Seizures ([place])    Stroke [facility])     MEDICATIONS: Current Outpatient Medications on File Prior to Visit  Medication Sig Dispense Refill   allopurinol (ZYLOPRIM) 300 MG tablet Take 300 mg by mouth daily as needed.  amLODipine (NORVASC) 10 MG tablet Take 1 tablet (10 mg total) by mouth daily. 90 tablet 3   apixaban (ELIQUIS) 5 MG TABS tablet Take 1 tablet (5 mg total) by mouth  2 (two) times daily. 180 tablet 3   atorvastatin (LIPITOR) 40 MG tablet Take 1 tablet (40 mg total) by mouth daily at 6 PM. 90 tablet 3   diazePAM, 15 MG Dose, (VALTOCO 15 MG DOSE) 2 x 7.5 MG/0.1ML LQPK Spray in each nostril as needed for seizure. May use second dose after 4 hours if needed. 5 each 5   lacosamide (VIMPAT) 50 MG TABS tablet Take 1 tablet (50 mg total) by mouth 2 (two) times daily. 180 tablet 3   levETIRAcetam (KEPPRA) 1000 MG tablet TAKE 2 TABLETS BY MOUTH TWICE  DAILY 360 tablet 1   metoprolol tartrate (LOPRESSOR) 25 MG tablet Take 0.5 tablets (12.5 mg total) by mouth 2 (two) times daily. 90 tablet 3   mirtazapine (REMERON SOL-TAB) 15 MG disintegrating tablet Take 1 tablet (15 mg total) by mouth at bedtime. 30 tablet 1   No current facility-administered medications on file prior to visit.    ALLERGIES: No Known Allergies  FAMILY HISTORY: Family History  Problem Relation Age of Onset   COPD Mother    Heart attack Father     SOCIAL HISTORY: Social History   Socioeconomic History   Marital status: Married    Spouse name: [name]   Number of children: 4   Years of education: 10th grade   Highest education level: 9th grade  Occupational History   Occupation: retired  Tobacco Use   Smoking status: Former    Packs/day: 1.00    Years: 50.00    Total pack years: 50.00    Types: Cigarettes    Quit date: 12/15/2009    Years since quitting: 13.1   Smokeless tobacco: Never  Vaping Use   Vaping Use: Never used  Substance and Sexual Activity   Alcohol use: No    Alcohol/week: 0.0 standard drinks of alcohol   Drug use: No   Sexual activity: Not on file  Other Topics Concern   Not on file  Social History Narrative   ** Merged History Encounter ** Right handed    Caffeine 2 cups daily   Home is one story   Social Determinants of Health   Financial Resource Strain: Low Risk  (04/08/2022)   Overall Financial Resource Strain (CARDIA)    Difficulty of Paying Living  Expenses: Not hard at all  Food Insecurity: No Food Insecurity (04/08/2022)   Hunger Vital Sign    Worried About Running Out of Food in the Last Year: Never true    Ran Out of Food in the Last Year: Never true  Transportation Needs: No Transportation Needs (04/08/2022)   PRAPARE - Hydrologist (Medical): No    Lack of Transportation (Non-Medical): No  Physical Activity: Sufficiently Active (04/08/2022)   Exercise Vital Sign    Days of Exercise per Week: 7 days    Minutes of Exercise per Session: 30 min  Stress: No Stress Concern Present (04/08/2022)   [place]    Feeling of Stress : Not at all  Social Connections: Moderately Isolated (04/08/2022)   Social Connection and Isolation Panel [NHANES]    Frequency of Communication with Friends and Family: More than three times a week    Frequency of Social Gatherings with Friends and Family: Once  a week    Attends Religious Services: More than 4 times per year    Active Member of Clubs or Organizations: No    Attends Archivist Meetings: Never    Marital Status: Widowed  Intimate Partner Violence: Not At Risk (04/08/2022)   Humiliation, Afraid, Rape, and Kick questionnaire    Fear of Current or Ex-Partner: No    Emotionally Abused: No    Physically Abused: No    Sexually Abused: No     PHYSICAL EXAM: Vitals:   02/18/23 1455  BP: (!) 141/77  Pulse: 77  SpO2: 100%   General: No acute distress Head:  Normocephalic/atraumatic Skin/Extremities: No rash, no edema Neurological Exam: alert and awake. No aphasia or dysarthria. Fund of knowledge is appropriate.  Attention and concentration are normal.   Cranial nerves: Pupils equal, round. He has difficulty counting fingers and seeing centrally and peripherally. Extraocular movements intact with no nystagmus. He has difficulty with peripheral vision. No facial asymmetry.  Motor: Bulk and tone  normal, muscle strength 5/5 throughout with no pronator drift.   Finger to nose testing intact.  Gait slow and cautious, no ataxia. No tremors.   IMPRESSION: This is a pleasant 87 yo RH man with a history of hypertension, hyperlipidemia, PE on Eliquis, prostate cancer, prior R MCA stroke, who had new onset focal status epilepticus last 08/13/21 with left face/arm twitching.  EEG showed LPDs arising from the right temporal region/right hemisphere. MRI no acute changes, right MCA encephalomalacia. He is on Eliquis for secondary stroke prevention, continue control of vascular risk factors. They deny any focal seizures since addition of low dose Lacosamide '50mg'$  BID in April 2023. No seizures since 03/24/22, continue Levetiracetam '2000mg'$  BID and Lacosamide '50mg'$  BID. They know to call oru office if there are any issues with refills, he ran out of Lacosamide last night and will be given samples until mail order arrives. He is aware of [place] driving laws to stop driving after a seizure until 6 months seizure-free, however we again discussed vision issues which also preclude driving at this time. Follow-up with glaucoma specialist as scheduled. Follow-up in 6 months, call for any changes.   Thank you for allowing me to participate in his care.  Please do not hesitate to call for any questions or concerns.   Ellouise Newer, M.D.   CC: Dr. Alba Cory

## 2023-02-21 DIAGNOSIS — H401133 Primary open-angle glaucoma, bilateral, severe stage: Secondary | ICD-10-CM | POA: Diagnosis not present

## 2023-03-13 DIAGNOSIS — C61 Malignant neoplasm of prostate: Secondary | ICD-10-CM | POA: Diagnosis not present

## 2023-03-27 DIAGNOSIS — C61 Malignant neoplasm of prostate: Secondary | ICD-10-CM | POA: Diagnosis not present

## 2023-03-27 DIAGNOSIS — R338 Other retention of urine: Secondary | ICD-10-CM | POA: Diagnosis not present

## 2023-03-27 DIAGNOSIS — C7951 Secondary malignant neoplasm of bone: Secondary | ICD-10-CM | POA: Diagnosis not present

## 2023-04-10 ENCOUNTER — Other Ambulatory Visit: Payer: Self-pay

## 2023-04-10 ENCOUNTER — Encounter (HOSPITAL_COMMUNITY): Payer: Self-pay | Admitting: Internal Medicine

## 2023-04-10 ENCOUNTER — Emergency Department (HOSPITAL_COMMUNITY): Payer: Medicare HMO

## 2023-04-10 ENCOUNTER — Observation Stay (HOSPITAL_COMMUNITY)
Admission: EM | Admit: 2023-04-10 | Discharge: 2023-04-11 | Disposition: A | Discharge status: Home / Self Care | Payer: Medicare HMO | Attending: Emergency Medicine | Admitting: Emergency Medicine

## 2023-04-10 DIAGNOSIS — R569 Unspecified convulsions: Principal | ICD-10-CM | POA: Insufficient documentation

## 2023-04-10 DIAGNOSIS — H409 Unspecified glaucoma: Secondary | ICD-10-CM | POA: Diagnosis present

## 2023-04-10 DIAGNOSIS — Z87891 Personal history of nicotine dependence: Secondary | ICD-10-CM | POA: Insufficient documentation

## 2023-04-10 DIAGNOSIS — G40909 Epilepsy, unspecified, not intractable, without status epilepticus: Secondary | ICD-10-CM | POA: Diagnosis not present

## 2023-04-10 DIAGNOSIS — R1111 Vomiting without nausea: Secondary | ICD-10-CM | POA: Diagnosis not present

## 2023-04-10 DIAGNOSIS — J690 Pneumonitis due to inhalation of food and vomit: Secondary | ICD-10-CM | POA: Diagnosis not present

## 2023-04-10 DIAGNOSIS — Z7901 Long term (current) use of anticoagulants: Secondary | ICD-10-CM | POA: Diagnosis not present

## 2023-04-10 DIAGNOSIS — Z9079 Acquired absence of other genital organ(s): Secondary | ICD-10-CM | POA: Insufficient documentation

## 2023-04-10 DIAGNOSIS — I350 Nonrheumatic aortic (valve) stenosis: Secondary | ICD-10-CM | POA: Diagnosis not present

## 2023-04-10 DIAGNOSIS — E7800 Pure hypercholesterolemia, unspecified: Secondary | ICD-10-CM | POA: Diagnosis present

## 2023-04-10 DIAGNOSIS — R404 Transient alteration of awareness: Secondary | ICD-10-CM

## 2023-04-10 DIAGNOSIS — Z79899 Other long term (current) drug therapy: Secondary | ICD-10-CM | POA: Diagnosis not present

## 2023-04-10 DIAGNOSIS — R231 Pallor: Secondary | ICD-10-CM | POA: Diagnosis not present

## 2023-04-10 DIAGNOSIS — R918 Other nonspecific abnormal finding of lung field: Secondary | ICD-10-CM | POA: Diagnosis not present

## 2023-04-10 DIAGNOSIS — R000 Tachycardia, unspecified: Secondary | ICD-10-CM | POA: Diagnosis not present

## 2023-04-10 DIAGNOSIS — I10 Essential (primary) hypertension: Secondary | ICD-10-CM | POA: Diagnosis present

## 2023-04-10 DIAGNOSIS — R4781 Slurred speech: Secondary | ICD-10-CM | POA: Diagnosis not present

## 2023-04-10 DIAGNOSIS — R2689 Other abnormalities of gait and mobility: Secondary | ICD-10-CM | POA: Diagnosis not present

## 2023-04-10 DIAGNOSIS — Z8546 Personal history of malignant neoplasm of prostate: Secondary | ICD-10-CM | POA: Diagnosis not present

## 2023-04-10 DIAGNOSIS — Z8673 Personal history of transient ischemic attack (TIA), and cerebral infarction without residual deficits: Secondary | ICD-10-CM | POA: Insufficient documentation

## 2023-04-10 DIAGNOSIS — R0902 Hypoxemia: Secondary | ICD-10-CM | POA: Diagnosis not present

## 2023-04-10 HISTORY — DX: Pneumonitis due to inhalation of food and vomit: J69.0

## 2023-04-10 LAB — CBC WITH DIFFERENTIAL/PLATELET
Abs Immature Granulocytes: 0.02 10*3/uL (ref 0.00–0.07)
Basophils Absolute: 0.1 10*3/uL (ref 0.0–0.1)
Basophils Relative: 1 %
Eosinophils Absolute: 0.1 10*3/uL (ref 0.0–0.5)
Eosinophils Relative: 2 %
HCT: 38.9 % — ABNORMAL LOW (ref 39.0–52.0)
Hemoglobin: 12.5 g/dL — ABNORMAL LOW (ref 13.0–17.0)
Immature Granulocytes: 0 %
Lymphocytes Relative: 27 %
Lymphs Abs: 1.7 10*3/uL (ref 0.7–4.0)
MCH: 28.9 pg (ref 26.0–34.0)
MCHC: 32.1 g/dL (ref 30.0–36.0)
MCV: 90 fL (ref 80.0–100.0)
Monocytes Absolute: 0.3 10*3/uL (ref 0.1–1.0)
Monocytes Relative: 4 %
Neutro Abs: 4.1 10*3/uL (ref 1.7–7.7)
Neutrophils Relative %: 66 %
Platelets: 134 10*3/uL — ABNORMAL LOW (ref 150–400)
RBC: 4.32 MIL/uL (ref 4.22–5.81)
RDW: 13.2 % (ref 11.5–15.5)
WBC: 6.3 10*3/uL (ref 4.0–10.5)
nRBC: 0 % (ref 0.0–0.2)

## 2023-04-10 LAB — COMPREHENSIVE METABOLIC PANEL
ALT: 16 U/L (ref 0–44)
AST: 19 U/L (ref 15–41)
Albumin: 3.2 g/dL — ABNORMAL LOW (ref 3.5–5.0)
Alkaline Phosphatase: 47 U/L (ref 38–126)
Anion gap: 7 (ref 5–15)
BUN: 11 mg/dL (ref 8–23)
CO2: 26 mmol/L (ref 22–32)
Calcium: 9.2 mg/dL (ref 8.9–10.3)
Chloride: 106 mmol/L (ref 98–111)
Creatinine, Ser: 1.05 mg/dL (ref 0.61–1.24)
GFR, Estimated: >60 mL/min (ref >60)
Glucose, Bld: 152 mg/dL — ABNORMAL HIGH (ref 70–99)
Potassium: 3.8 mmol/L (ref 3.5–5.1)
Sodium: 139 mmol/L (ref 135–145)
Total Bilirubin: 0.4 mg/dL (ref 0.3–1.2)
Total Protein: 6.1 g/dL — ABNORMAL LOW (ref 6.5–8.1)

## 2023-04-10 LAB — I-STAT VENOUS BLOOD GAS, ED
Acid-base deficit: 2 mmol/L (ref 0.0–2.0)
Bicarbonate: 26.9 mmol/L (ref 20.0–28.0)
Calcium, Ion: 1.33 mmol/L (ref 1.15–1.40)
HCT: 39 % (ref 39.0–52.0)
Hemoglobin: 13.3 g/dL (ref 13.0–17.0)
O2 Saturation: 62 %
Potassium: 4 mmol/L (ref 3.5–5.1)
Sodium: 141 mmol/L (ref 135–145)
TCO2: 29 mmol/L (ref 22–32)
pCO2, Ven: 61.4 mmHg — ABNORMAL HIGH (ref 44–60)
pH, Ven: 7.25 (ref 7.25–7.43)
pO2, Ven: 38 mmHg (ref 32–45)

## 2023-04-10 LAB — LACTIC ACID, PLASMA
Lactic Acid, Venous: 1.7 mmol/L (ref 0.5–1.9)
Lactic Acid, Venous: 1.5 mmol/L (ref 0.5–1.9)

## 2023-04-10 LAB — LIPASE, BLOOD: Lipase: 27 U/L (ref 11–51)

## 2023-04-10 LAB — TROPONIN I (HIGH SENSITIVITY)
Troponin I (High Sensitivity): 21 ng/L — ABNORMAL HIGH (ref <18)
Troponin I (High Sensitivity): 21 ng/L — ABNORMAL HIGH (ref <18)

## 2023-04-10 LAB — CBG MONITORING, ED: Glucose-Capillary: 105 mg/dL — ABNORMAL HIGH (ref 70–99)

## 2023-04-10 LAB — PROCALCITONIN: Procalcitonin: <0.1 ng/mL

## 2023-04-10 MED ORDER — ONDANSETRON HCL 4 MG PO TABS: 4.0000 mg | ORAL_TABLET | Freq: Four times a day (QID) | ORAL | Status: DC | PRN

## 2023-04-10 MED ORDER — LACOSAMIDE 50 MG PO TABS
50.0000 mg | ORAL_TABLET | Freq: Two times a day (BID) | ORAL | Status: DC
  Administered 2023-04-10 – 2023-04-11 (×2): 50 mg via ORAL
  Filled 2023-04-10 (×2): qty 1

## 2023-04-10 MED ORDER — LORAZEPAM 2 MG/ML IJ SOLN: 4.0000 mg | INTRAMUSCULAR | Status: DC | PRN

## 2023-04-10 MED ORDER — HYDRALAZINE HCL 20 MG/ML IJ SOLN: 5.0000 mg | INTRAMUSCULAR | Status: DC | PRN

## 2023-04-10 MED ORDER — TIMOLOL MALEATE 0.5 % OP SOLN
1.0000 [drp] | Freq: Two times a day (BID) | OPHTHALMIC | Status: DC
  Administered 2023-04-10 – 2023-04-11 (×2): 1 [drp] via OPHTHALMIC
  Filled 2023-04-10: qty 5

## 2023-04-10 MED ORDER — ONDANSETRON HCL 4 MG/2ML IJ SOLN: 4.0000 mg | Freq: Four times a day (QID) | INTRAMUSCULAR | Status: DC | PRN

## 2023-04-10 MED ORDER — DORZOLAMIDE HCL 2 % OP SOLN
1.0000 [drp] | Freq: Two times a day (BID) | OPHTHALMIC | Status: DC
  Administered 2023-04-10 – 2023-04-11 (×2): 1 [drp] via OPHTHALMIC
  Filled 2023-04-10: qty 10

## 2023-04-10 MED ORDER — ACETAMINOPHEN 650 MG RE SUPP: 650.0000 mg | RECTAL | Status: DC | PRN

## 2023-04-10 MED ORDER — METOPROLOL TARTRATE 12.5 MG HALF TABLET
12.5000 mg | ORAL_TABLET | Freq: Two times a day (BID) | ORAL | Status: DC
  Administered 2023-04-11: 12.5 mg via ORAL
  Filled 2023-04-10: qty 1

## 2023-04-10 MED ORDER — APIXABAN 5 MG PO TABS
5.0000 mg | ORAL_TABLET | Freq: Two times a day (BID) | ORAL | Status: DC
  Administered 2023-04-10 – 2023-04-11 (×2): 5 mg via ORAL
  Filled 2023-04-10 (×2): qty 1

## 2023-04-10 MED ORDER — SODIUM CHLORIDE 0.9% FLUSH
3.0000 mL | Freq: Two times a day (BID) | INTRAVENOUS | Status: DC
  Administered 2023-04-10 – 2023-04-11 (×3): 3 mL via INTRAVENOUS

## 2023-04-10 MED ORDER — LEVETIRACETAM 750 MG PO TABS
2000.0000 mg | ORAL_TABLET | Freq: Two times a day (BID) | ORAL | Status: DC
  Administered 2023-04-10 – 2023-04-11 (×2): 2000 mg via ORAL
  Filled 2023-04-10 (×2): qty 1

## 2023-04-10 MED ORDER — SODIUM CHLORIDE 0.9 % IV BOLUS
1000.0000 mL | Freq: Once | INTRAVENOUS | Status: AC
  Administered 2023-04-10: 1000 mL via INTRAVENOUS

## 2023-04-10 MED ORDER — BRIMONIDINE TARTRATE 0.2 % OP SOLN
1.0000 [drp] | Freq: Two times a day (BID) | OPHTHALMIC | Status: DC
  Administered 2023-04-10 – 2023-04-11 (×2): 1 [drp] via OPHTHALMIC
  Filled 2023-04-10: qty 5

## 2023-04-10 MED ORDER — LACTATED RINGERS IV SOLN: INTRAVENOUS | Status: DC

## 2023-04-10 MED ORDER — ACETAMINOPHEN 325 MG PO TABS: 650.0000 mg | ORAL_TABLET | ORAL | Status: DC | PRN

## 2023-04-10 MED ORDER — MIRTAZAPINE 15 MG PO TBDP
15.0000 mg | ORAL_TABLET | Freq: Every day | ORAL | Status: DC
  Administered 2023-04-10: 15 mg via ORAL
  Filled 2023-04-10 (×2): qty 1

## 2023-04-10 MED ORDER — LATANOPROST 0.005 % OP SOLN
1.0000 [drp] | Freq: Every day | OPHTHALMIC | Status: DC
  Administered 2023-04-10: 1 [drp] via OPHTHALMIC
  Filled 2023-04-10: qty 2.5

## 2023-04-10 MED ORDER — ATORVASTATIN CALCIUM 40 MG PO TABS
40.0000 mg | ORAL_TABLET | Freq: Every day | ORAL | Status: DC
  Administered 2023-04-10: 40 mg via ORAL
  Filled 2023-04-10: qty 1

## 2023-04-10 MED ORDER — SODIUM CHLORIDE 0.9 % IV SOLN
3.0000 g | Freq: Once | INTRAVENOUS | Status: AC
  Administered 2023-04-10: 3 g via INTRAVENOUS
  Filled 2023-04-10: qty 8

## 2023-04-10 MED ORDER — CHLORHEXIDINE GLUCONATE 0.12% ORAL RINSE (MEDLINE KIT)
15.0000 mL | Freq: Two times a day (BID) | OROMUCOSAL | Status: DC
  Administered 2023-04-10: 15 mL via OROMUCOSAL

## 2023-04-10 MED ORDER — ORAL CARE MOUTH RINSE
15.0000 mL | OROMUCOSAL | Status: DC
  Administered 2023-04-10 – 2023-04-11 (×8): 15 mL via OROMUCOSAL

## 2023-04-10 NOTE — ED Triage Notes (Signed)
Pt BIB EMS from home, wife said she heard slurred speech this morning, but no slurred speech noted with EMS. On scene hypotensive 90/60 and sating 70%, on 6L went up to 100%. EMS gave 400cc fluids and repeat pressure 105/68. One episode of vomiting. Hx of seizures and strokes with left deficit at baseline. Wife gave diazepam at home and nasal spray that he normally takes for seizures. Aox4.

## 2023-04-10 NOTE — ED Notes (Signed)
Dr.Goldston shown results of Istat VBG. ED-Lab. 

## 2023-04-10 NOTE — ED Notes (Signed)
Back from radiology.

## 2023-04-10 NOTE — ED Notes (Signed)
Pt using RR NT Christian at bedside assisting pt.

## 2023-04-10 NOTE — ED Notes (Signed)
Admitting MD at [facility]. 5N contacted, no answer.

## 2023-04-10 NOTE — ED Notes (Addendum)
Sz pads/ precautions in place, alert, NAD, calm, interactive, wanting to call his daughter at [facility]. Lying on L side. BP low. Will monitor.  SPO2 100%, intermittent signal, 1L [place].

## 2023-04-10 NOTE — ED Notes (Signed)
EDP speaking with daughter Alvy Beal. Will try and contact other daughter [name].

## 2023-04-10 NOTE — ED Notes (Signed)
Attempted call to family, message left. EDP notified of low BP. No other changes. Remains alert, sleepy, interactive.

## 2023-04-10 NOTE — H&P (Signed)
History and Physical    Patient: Travis Salazar. ZOX:[id number] DOB: [birth year] DOA: 04/10/2023 DOS: the patient was seen and examined on 04/10/2023 PCP: Ronnald Ramp, MD  Patient coming from: Home - lives with son and daughter; NOK: Daughter, Kyair Ditommaso, [phone]   Chief Complaint: Slurred speech  HPI: Travis Salazar. is a 87 y.o. male with medical history significant of HTN, HLD, CVA with post-stroke seizures, and prostate CA s/p prostatectomy presenting with slurred speech.   He reports that he felt ok upon awakening this AM.  He began feeling somewhat dizzy and vomited and then he thinks he might have had a seizure.  His daughter recommended that he come in for evaluation.  No cough, SOB, fever.      ER Course:  ?seizure.  Home and eating breakfast, he groaned and complained of dizziness.  He vomited, slurred speech.  Given intranasal Valium.  Hypoxic with EMS, placed on O2.  Not post-ictal in ER.  Head CT ok, labs ok.  Troponin slightly increased.  Negative lactate.  Neuro discussed, unlikely seizure.  ?pre-syncope.  +aspiration PNA, on IV Unasyn, stable on 2L [place] O2.  Hypotensive on arrival, improved with IVF.     Review of Systems: As mentioned in the history of present illness. All other systems reviewed and are negative. Past Medical History:  Diagnosis Date   Hyperlipidemia    Hypertension    Seizures (HCC)    Stroke [facility])    Past Surgical History:  Procedure Laterality Date   LIPOMA EXCISION     neck   PROSTATECTOMY     due to prostate cancer   Social History:  reports that he quit smoking about 13 years ago. His smoking use included cigarettes. He has a 50.00 pack-year smoking history. He has never used smokeless tobacco. He reports that he does not drink alcohol and does not use drugs.  No Known Allergies  Family History  Problem Relation Age of Onset   COPD Mother    Heart attack Father     Prior to Admission medications   Medication Sig  Start Date End Date Taking? Authorizing Provider  allopurinol (ZYLOPRIM) 300 MG tablet Take 300 mg by mouth daily as needed.    [provider]  amLODipine (NORVASC) 10 MG tablet Take 1 tablet (10 mg total) by mouth daily. 02/05/23   Simmons-Robinson, Tawanna Cooler, MD  apixaban (ELIQUIS) 5 MG TABS tablet Take 1 tablet (5 mg total) by mouth 2 (two) times daily. 02/05/23   Simmons-Robinson, Tawanna Cooler, MD  atorvastatin (LIPITOR) 40 MG tablet Take 1 tablet (40 mg total) by mouth daily at 6 PM. 02/05/23   Simmons-Robinson, Makiera, MD  diazePAM, 15 MG Dose, (VALTOCO 15 MG DOSE) 2 x 7.5 MG/0.1ML LQPK Spray in each nostril as needed for seizure. May use second dose after 4 hours if needed. 04/02/22   Van Clines, MD  lacosamide (VIMPAT) 50 MG TABS tablet Take 1 tablet (50 mg total) by mouth 2 (two) times daily. 02/18/23   Van Clines, MD  levETIRAcetam (KEPPRA) 1000 MG tablet Take 2 tablets (2,000 mg total) by mouth 2 (two) times daily. 02/18/23   Van Clines, MD  metoprolol tartrate (LOPRESSOR) 25 MG tablet Take 0.5 tablets (12.5 mg total) by mouth 2 (two) times daily. 02/05/23   Simmons-Robinson, Tawanna Cooler, MD  mirtazapine (REMERON SOL-TAB) 15 MG disintegrating tablet Take 1 tablet (15 mg total) by mouth at bedtime. 02/07/23   Simmons-Robinson, Tawanna Cooler, MD    Physical Exam:  Vitals:   04/10/23 1600 04/10/23 1630 04/10/23 1713 04/10/23 1735  BP: 135/83 127/73 118/71 106/67  Pulse:   76 80  Resp: Temp:   97.6 F (36.4 C) 97.6 F (36.4 C)  TempSrc:    Oral  SpO2: 100%  99%   Weight:    96.6 kg  Height:     (1.727 m)   General:  Appears calm and comfortable and is in NAD, lying still with his eyes mostly closed but able to answer questions appropriately Eyes:  EOMI, normal lids, iris ENT:  grossly normal hearing, lips & tongue, mmm; poor dentition Neck:  no LAD, masses or thyromegaly Cardiovascular:  RRR, no r/g, 3-4/6 systolic murmur. No LE edema.  Respiratory:   CTA  bilaterally with no wheezes/rales/rhonchi.  Normal respiratory effort. Abdomen:  soft, NT, ND Skin:  no rash or induration seen on limited exam Musculoskeletal:  grossly normal tone BUE/BLE, good ROM, no bony abnormality Psychiatric:  blunted mood and affect, speech fluent and appropriate, AOx3 Neurologic:  CN 2-12 grossly intact, moves all extremities in coordinated fashion   Radiological Exams on Admission: Independently reviewed - see discussion in A/P where applicable  CT Head Wo Contrast  Result Date: 04/10/2023 CLINICAL DATA:  Seizure EXAM: CT HEAD WITHOUT CONTRAST TECHNIQUE: Contiguous axial images were obtained from the base of the skull through the vertex without intravenous contrast. RADIATION DOSE REDUCTION: This exam was performed according to the departmental dose-optimization program which includes automated exposure control, adjustment of the mA and/or kV according to patient size and/or use of iterative reconstruction technique. COMPARISON:  CT head 01/20/22 FINDINGS: Brain: Redemonstrated large right MCA territory infarct, unchanged from prior exam. No hemorrhage. No CT evidence of a new cortical infarct. No hydrocephalus. No extra-axial fluid collection. Moderate chronic microvascular ischemic change. Vascular: No hyperdense vessel or unexpected calcification. Skull: Normal. Negative for fracture or focal lesion. Sinuses/Orbits: No middle ear or mastoid effusion. Polypoid mucosal thickening right maxillary sinus. Orbits are unremarkable. Other: None. IMPRESSION: Redemonstrated large right MCA territory infarct, unchanged from prior exam. No hemorrhage or CT evidence of a new cortical infarct. Electronically Signed   By: Lorenza Cambridge M.D.   On: 04/10/2023 12:42   DG Chest 2 View  Result Date: 04/10/2023 CLINICAL DATA:  Aspiration EXAM: CHEST - 2 VIEW COMPARISON:  CXR 03/24/22 FINDINGS: No pleural effusion. No pneumothorax. Normal cardiac and mediastinal contours. Aortic atherosclerotic  calcifications. There is a hazy opacity at the left lung base, which could represent atelectasis, infection, or aspiration. No radiographically apparent displaced rib fractures. Visualized upper abdomen is unremarkable. Vertebral body heights are maintained. IMPRESSION: Hazy opacity at the left lung base could represent atelectasis, infection, or aspiration. Electronically Signed   By: Lorenza Cambridge M.D.   On: 04/10/2023 12:35    EKG: Independently reviewed.  NSR with rate 58; nonspecific ST changes with no evidence of acute ischemia   Labs on Admission: I have personally reviewed the available labs and imaging studies at the time of the admission.  Pertinent labs:   VBG: 7.25/61.4/26.9 Glucose 152 Albumin 3.2 HS troponin 21 Lactate 1.7, 1.5   Assessment and Plan: Principal Problem:   Seizure disorder (HCC) Active Problems:   Hypercholesterolemia   Aortic stenosis   Essential hypertension   Aspiration pneumonia (HCC)   Glaucoma (increased eye pressure)    Seizure d/o -Patient with known seizure d/o -Had an episode this AM that sounds c/w seizure and then has apparent aspiration  PNA on CXR -Will observe overnight -Neurology discussed with EDP but did not consult formally -Continue Keppra, Vimpat -Seizure precautions -Ativan prn   Aspiration PNA -Patient with persistent hypoxia in the ER -Denies apparent aspiration symptoms -He is at significant risk for aspiration given his seizure d/o if he did have a seizure today -Imaging is c/w aspiration PNA -Will monitor on telemetry for now in observation status  -Will cover with Unasyn for now  -Will order lower respiratory tract procalcitonin level.   >0.5 indicates infection and >>0.5 indicates more serious disease.  As the procalcitonin level normalizes, it will be reasonable to consider de-escalation of antibiotic coverage.  The sensitivity of procalcitonin is variable and should not be used alone to guide  treatment.  HTN -Hold BP medications due to hypotension in ER (amlodipine, restart metoprolol in AM)  HLD -Continue atorvastatin  Glaucoma -Continue brimonidine, dorzolamide, latanoprost, timolol  H/o CVA, PE -Continue mirtazapine, Eliquis  AS -Last seen for this in 12/2022 with plan for f/u with repeat echo in 3 months   Advance Care Planning:   Code Status: Full Code   Consults: Neurology by telephone only  DVT Prophylaxis: Eliquis  Family Communication: None present; I attempted to reach his daughter by telephone without success  Severity of Illness: The appropriate patient status for this patient is OBSERVATION. Observation status is judged to be reasonable and necessary in order to provide the required intensity of service to ensure the patient's safety. The patient's presenting symptoms, physical exam findings, and initial radiographic and laboratory data in the context of their medical condition is felt to place them at decreased risk for further clinical deterioration. Furthermore, it is anticipated that the patient will be medically stable for discharge from the hospital within 2 midnights of admission.   Author: Jonah Blue, MD 04/10/2023 6:42 PM  For on call review [email or web address].

## 2023-04-10 NOTE — ED Notes (Signed)
Back from radiology, no changes, alert, NAD, calm, interactive 

## 2023-04-10 NOTE — ED Notes (Signed)
Pt to CT/ xray, will return to 32

## 2023-04-10 NOTE — ED Provider Notes (Signed)
[place] EMERGENCY DEPARTMENT AT [facility] Provider Note   CSN: [id number] Arrival date & time: 04/10/23  1051     History  Chief Complaint  Patient presents with   Nausea   Emesis   Hypotension    Travis Salazar. is a 87 y.o. male.  With past medical history of DVT, PE, hypertension, aortic stenosis, stroke with subsequent seizures, prostate cancer s/p prostatectomy on hormonal treatment who presents to the emergency department with possible seizure.  He states that he may have had a seizure this morning.  States that he was sitting having breakfast and he felt dizzy for about 10 to 15 minutes.  States that he vomited.  Patient is a poor historian but states that his daughter Travis Salazar was there.  I called and spoke with daughter, Travis Salazar.  She states that she was with her father this morning.  States that he had just eaten breakfast and taken his medication.  She walked into another room and heard him groaning and complaining of feeling dizzy.  She states that she went back in there and he was diaphoretic, had vomited and was slurring his words.  She states that she gave him intranasal diazepam and by the time she given this medication the fire department showed up.  She states that he has had previous seizures with slurred speech but also states he had a stroke before and is unsure if it was a seizure or stroke.  She denies seeing any generalized shaking.  He did not fall and strike his head.  There is no incontinence or tongue biting.  Emesis      Home Medications Prior to Admission medications   Medication Sig Start Date End Date Taking? Authorizing Provider  allopurinol (ZYLOPRIM) 300 MG tablet Take 300 mg by mouth daily as needed.    [provider]  amLODipine (NORVASC) 10 MG tablet Take 1 tablet (10 mg total) by mouth daily. 02/05/23   Simmons-Robinson, Tawanna Cooler, MD  apixaban (ELIQUIS) 5 MG TABS tablet Take 1 tablet (5 mg total) by mouth 2 (two) times daily.  02/05/23   Simmons-Robinson, Tawanna Cooler, MD  atorvastatin (LIPITOR) 40 MG tablet Take 1 tablet (40 mg total) by mouth daily at 6 PM. 02/05/23   Simmons-Robinson, Makiera, MD  diazePAM, 15 MG Dose, (VALTOCO 15 MG DOSE) 2 x 7.5 MG/0.1ML LQPK Spray in each nostril as needed for seizure. May use second dose after 4 hours if needed. 04/02/22   Van Clines, MD  lacosamide (VIMPAT) 50 MG TABS tablet Take 1 tablet (50 mg total) by mouth 2 (two) times daily. 02/18/23   Van Clines, MD  levETIRAcetam (KEPPRA) 1000 MG tablet Take 2 tablets (2,000 mg total) by mouth 2 (two) times daily. 02/18/23   Van Clines, MD  metoprolol tartrate (LOPRESSOR) 25 MG tablet Take 0.5 tablets (12.5 mg total) by mouth 2 (two) times daily. 02/05/23   Simmons-Robinson, Tawanna Cooler, MD  mirtazapine (REMERON SOL-TAB) 15 MG disintegrating tablet Take 1 tablet (15 mg total) by mouth at bedtime. 02/07/23   Simmons-Robinson, Tawanna Cooler, MD      Allergies    Patient has no known allergies.    Review of Systems   Review of Systems  Constitutional:  Positive for diaphoresis.  Gastrointestinal:  Positive for vomiting.  Neurological:  Positive for dizziness.  All other systems reviewed and are negative.   Physical Exam Updated Vital Signs BP 107/63   Pulse 95   Temp 98.1 F (36.7 C) (Oral)  Resp 15   SpO2 100%  Physical Exam Vitals and nursing note reviewed.  Constitutional:      General: He is not in acute distress.    Appearance: Normal appearance. He is not ill-appearing.  HENT:     Head: Normocephalic.     Mouth/Throat:     Mouth: Mucous membranes are dry.     Pharynx: Oropharynx is clear.  Eyes:     General: No scleral icterus.    Extraocular Movements: Extraocular movements intact.     Pupils: Pupils are equal, round, and reactive to light.  Cardiovascular:     Rate and Rhythm: Normal rate and regular rhythm.     Pulses: Normal pulses.     Heart sounds: No murmur heard. Pulmonary:     Effort: Pulmonary effort is  normal.     Breath sounds: Normal breath sounds.  Abdominal:     General: Bowel sounds are normal.     Palpations: Abdomen is soft.     Tenderness: There is no abdominal tenderness.  Musculoskeletal:     Cervical back: Neck supple.  Skin:    General: Skin is warm and dry.     Capillary Refill: Capillary refill takes less than 2 seconds.  Neurological:     General: No focal deficit present.     Mental Status: He is oriented to person, place, and time.     Cranial Nerves: Cranial nerves 2-12 are intact.     Sensory: Sensation is intact.     Motor: No seizure activity or pronator drift.     Coordination: Finger-Nose-Finger Test abnormal.     Comments: 4/5 strength LLE. 5/5 RLE (appears to be baseline)  Psychiatric:        Mood and Affect: Mood normal.        Behavior: Behavior normal.     ED Results / Procedures / Treatments   Labs (all labs ordered are listed, but only abnormal results are displayed) Labs Reviewed  CBC WITH DIFFERENTIAL/PLATELET - Abnormal; Notable for the following components:      Result Value   Hemoglobin 12.5 (*)    HCT 38.9 (*)    Platelets 134 (*)    All other components within normal limits  COMPREHENSIVE METABOLIC PANEL - Abnormal; Notable for the following components:   Glucose, Bld 152 (*)    Total Protein 6.1 (*)    Albumin 3.2 (*)    All other components within normal limits  I-STAT VENOUS BLOOD GAS, ED - Abnormal; Notable for the following components:   pCO2, Ven 61.4 (*)    All other components within normal limits  CBG MONITORING, ED - Abnormal; Notable for the following components:   Glucose-Capillary 105 (*)    All other components within normal limits  TROPONIN I (HIGH SENSITIVITY) - Abnormal; Notable for the following components:   Troponin I (High Sensitivity) 21 (*)    All other components within normal limits  LIPASE, BLOOD  LACTIC ACID, PLASMA  LACTIC ACID, PLASMA  LACOSAMIDE  LEVETIRACETAM LEVEL  TROPONIN I (HIGH  SENSITIVITY)    EKG None  Radiology CT Head Wo Contrast  Result Date: 04/10/2023 CLINICAL DATA:  Seizure EXAM: CT HEAD WITHOUT CONTRAST TECHNIQUE: Contiguous axial images were obtained from the base of the skull through the vertex without intravenous contrast. RADIATION DOSE REDUCTION: This exam was performed according to the departmental dose-optimization program which includes automated exposure control, adjustment of the mA and/or kV according to patient size and/or use of iterative  reconstruction technique. COMPARISON:  CT head 01/20/22 FINDINGS: Brain: Redemonstrated large right MCA territory infarct, unchanged from prior exam. No hemorrhage. No CT evidence of a new cortical infarct. No hydrocephalus. No extra-axial fluid collection. Moderate chronic microvascular ischemic change. Vascular: No hyperdense vessel or unexpected calcification. Skull: Normal. Negative for fracture or focal lesion. Sinuses/Orbits: No middle ear or mastoid effusion. Polypoid mucosal thickening right maxillary sinus. Orbits are unremarkable. Other: None. IMPRESSION: Redemonstrated large right MCA territory infarct, unchanged from prior exam. No hemorrhage or CT evidence of a new cortical infarct. Electronically Signed   By: Lorenza Cambridge M.D.   On: 04/10/2023 12:42   DG Chest 2 View  Result Date: 04/10/2023 CLINICAL DATA:  Aspiration EXAM: CHEST - 2 VIEW COMPARISON:  CXR 03/24/22 FINDINGS: No pleural effusion. No pneumothorax. Normal cardiac and mediastinal contours. Aortic atherosclerotic calcifications. There is a hazy opacity at the left lung base, which could represent atelectasis, infection, or aspiration. No radiographically apparent displaced rib fractures. Visualized upper abdomen is unremarkable. Vertebral body heights are maintained. IMPRESSION: Hazy opacity at the left lung base could represent atelectasis, infection, or aspiration. Electronically Signed   By: Lorenza Cambridge M.D.   On: 04/10/2023 12:35     Procedures Procedures   Medications Ordered in ED Medications  Ampicillin-Sulbactam (UNASYN) 3 g in sodium chloride 0.9 % 100 mL IVPB (has no administration in time range)  sodium chloride 0.9 % bolus 1,000 mL (1,000 mLs Intravenous New Bag/Given 04/10/23 1319)    ED Course/ Medical Decision Making/ A&P                             Medical Decision Making Amount and/or Complexity of Data Reviewed Labs: ordered. Radiology: ordered.  Risk Decision regarding hospitalization.  Initial Impression and Ddx 87 year old male who presents to the emergency department with complaint of possible seizure at home earlier.  He is alert, oriented and conversive on my initial exam. Patient PMH that increases complexity of ED encounter: Stroke, seizure, hypertension, DVT/PE, aortic stenosis  Interpretation of Diagnostics I independent reviewed and interpreted the labs as followed: CBC with stable anemia, CMP with no severe electrolyte derangements.  Glucose is normal.  Lactic normal.  Initial troponin 21, delta pending, Keppra and lacosamide levels pending  - I independently visualized the following imaging with scope of interpretation limited to determining acute life threatening conditions related to emergency care: CT head, which revealed no acute findings.  Chest x-ray shows hazy opacity in the left lung base, likely aspiration  Patient Reassessment and Ultimate Disposition/Management 87 year old male who presents to the emergency department with complaint of possible seizure at home earlier today. On my initial exam he is alert, conversive.  He moves all extremities well although he has some mild left-sided weakness from his previous stroke.  There is no slurred speech.  He has no nystagmus.  No obvious seizure-like activity on my evaluation.  I called and spoke with his daughter he states that he was complaining about feeling dizzy, was diaphoretic and vomited and was slurring his words.  She  was concerned for seizure.  She gave him diazepam.  I reviewed the neurology notes from his most recent visit which discusses that he will get slurred speech, facial twitching and left arm weakness.  These 2 stories are inconsistent as far as seizure activity.  Will get a head CT as well as seizure workup.  Given the possibility that he is presyncopal given his  hypotension, diaphoresis and vomiting will also get a troponin, EKG.  He was also 70% on room air when fire got there and concern for aspiration so we will get chest x-ray.  Will give him some IV fluids for his soft pressures.  Labs without leukocytosis.  No electrolyte derangements.  Glucose is 105.  Lactic is 1.5.  Troponin initially is 21, delta is pending.  Drug levels are pending. CT head is without acute findings Chest x-ray does show likely aspiration pneumonia  Blood pressure improving with IV fluids.  I consulted and spoke with Dr. Derry Lory, neurology feels that this likely is not seizure related.  He does not feel that patient would benefit from EEG or MRI at this time.  Have started IV Unasyn for aspiration pneumonia.  He has improved and back down to 2 L nasal cannula satting well.  Will consult and speak with hospitalist regarding admission.  Consulted and spoke with Dr. Ophelia Charter who accepts patient for admission.   Patient management required discussion with the following services or consulting groups:  Hospitalist Service and Neurology  Complexity of Problems Addressed Acute complicated illness or Injury  Additional Data Reviewed and Analyzed Further history obtained from: Further history from spouse/family member, Past medical history and medications listed in the EMR, Prior ED visit notes, Care Everywhere, and Prior labs/imaging results  Patient Encounter Risk Assessment Prescriptions, SDOH impact on management, and Consideration of hospitalization  Final Clinical Impression(s) / ED Diagnoses Final diagnoses:   Altered awareness, transient    Rx / DC Orders ED Discharge Orders     None         Cristopher Peru, PA-C 04/10/23 1430    Pricilla Loveless, MD 04/10/23 [id number]

## 2023-04-10 NOTE — ED Notes (Signed)
EDP Dr. Criss Alvine at [facility]

## 2023-04-10 NOTE — ED Notes (Signed)
ED TO INPATIENT HANDOFF REPORT  ED Nurse Name and Phone #: Cletis Athens [phone]  S Name/Age/Gender Travis Salazar. 87 y.o. male Room/Bed: 032C/032C  Code Status   Code Status: Prior  Home/SNF/Other Home Patient oriented to: self, place, time, and situation Is this baseline? Yes   Triage Complete: Triage complete  Chief Complaint Aspiration pneumonia [J69.0]  Triage Note Pt BIB EMS from home, wife said she heard slurred speech this morning, but no slurred speech noted with EMS. On scene hypotensive 90/60 and sating 70%, on 6L went up to 100%. EMS gave 400cc fluids and repeat pressure 105/68. One episode of vomiting. Hx of seizures and strokes with left deficit at baseline. Wife gave diazepam at home and nasal spray that he normally takes for seizures. Aox4.    Allergies No Known Allergies  Level of Care/Admitting Diagnosis ED Disposition     ED Disposition  Admit   Condition  --   Comment  Hospital Area: [facility] [[id number]]  Level of Care: Telemetry Medical [104]  May place patient in observation at [facility] or [facility] if equivalent level of care is available:: No  Covid Evaluation: Asymptomatic - no recent exposure (last 10 days) testing not required  Diagnosis: Aspiration pneumonia [454098]  Admitting Physician: Jonah Blue [[id number]]  Attending Physician: Jonah Blue [[id number]]          B Medical/Surgery History Past Medical History:  Diagnosis Date   Hyperlipidemia    Hypertension    Seizures (HCC)    Stroke [facility])    Past Surgical History:  Procedure Laterality Date   LIPOMA EXCISION     neck   PROSTATECTOMY     due to prostate cancer     A IV Location/Drains/Wounds Patient Lines/Drains/Airways Status     Active Line/Drains/Airways     Name Placement date Placement time Site Days   Peripheral IV 04/10/23 Anterior;Right Antecubital 04/10/23  1100  Antecubital  less than 1   Peripheral IV 04/10/23 18 G 1" Left;Medial  Antecubital 04/10/23  --  Antecubital  less than 1   Wound / Incision (Open or Dehisced) 10/07/21 Eye Left;Lateral 10/07/21  0229  Eye  550            Intake/Output Last 24 hours No intake or output data in the 24 hours ending 04/10/23 1440  Labs/Imaging Results for orders placed or performed during the hospital encounter of 04/10/23 (from the past 48 hour(s))  CBC with Differential     Status: Abnormal   Collection Time: 04/10/23 11:15 AM  Result Value Ref Range   WBC 6.3 4.0 - 10.5 K/uL   RBC 4.32 4.22 - 5.81 MIL/uL   Hemoglobin 12.5 (L) 13.0 - 17.0 g/dL   HCT 11.9 (L) 14.7 - 82.9 %   MCV 90.0 80.0 - 100.0 fL   MCH 28.9 26.0 - 34.0 pg   MCHC 32.1 30.0 - 36.0 g/dL   RDW 56.2 13.0 - 86.5 %   Platelets 134 (L) 150 - 400 K/uL   nRBC 0.0 0.0 - 0.2 %   Neutrophils Relative % 66 %   Neutro Abs 4.1 1.7 - 7.7 K/uL   Lymphocytes Relative 27 %   Lymphs Abs 1.7 0.7 - 4.0 K/uL   Monocytes Relative 4 %   Monocytes Absolute 0.3 0.1 - 1.0 K/uL   Eosinophils Relative 2 %   Eosinophils Absolute 0.1 0.0 - 0.5 K/uL   Basophils Relative 1 %   Basophils Absolute 0.1 0.0 -  0.1 K/uL   Immature Granulocytes 0 %   Abs Immature Granulocytes 0.02 0.00 - 0.07 K/uL    Comment: Performed at [facility], [address], [place], [place] [address]  Comprehensive metabolic panel     Status: Abnormal   Collection Time: 04/10/23 11:15 AM  Result Value Ref Range   Sodium 139 135 - 145 mmol/L   Potassium 3.8 3.5 - 5.1 mmol/L   Chloride 106 98 - 111 mmol/L   CO2 26 22 - 32 mmol/L   Glucose, Bld 152 (H) 70 - 99 mg/dL    Comment: Glucose reference range applies only to samples taken after fasting for at least 8 hours.   BUN 11 8 - 23 mg/dL   Creatinine, Ser 3.08 0.61 - 1.24 mg/dL   Calcium 9.2 8.9 - 65.7 mg/dL   Total Protein 6.1 (L) 6.5 - 8.1 g/dL   Albumin 3.2 (L) 3.5 - 5.0 g/dL   AST 19 15 - 41 U/L   ALT 16 0 - 44 U/L   Alkaline Phosphatase 47 38 - 126 U/L   Total Bilirubin 0.4 0.3 - 1.2  mg/dL   GFR, Estimated >84 >69 mL/min    Comment: (NOTE) Calculated using the CKD-EPI Creatinine Equation (2021)    Anion gap 7 5 - 15    Comment: Performed at [facility], [address] [place], [place] [address]  Lipase, blood     Status: None   Collection Time: 04/10/23 11:15 AM  Result Value Ref Range   Lipase 27 11 - 51 U/L    Comment: Performed at [facility], [address], [place], [place] [address]  Lactic acid, plasma     Status: None   Collection Time: 04/10/23 11:15 AM  Result Value Ref Range   Lactic Acid, Venous 1.7 0.5 - 1.9 mmol/L    Comment: Performed at [facility], [address], [place], [place] [address]  I-Stat venous blood gas, [facility], [facility], [facility])     Status: Abnormal   Collection Time: 04/10/23 11:37 AM  Result Value Ref Range   pH, Ven 7.250 7.25 - 7.43   pCO2, Ven 61.4 (H) 44 - 60 mmHg   pO2, Ven 38 32 - 45 mmHg   Bicarbonate 26.9 20.0 - 28.0 mmol/L   TCO2 29 22 - 32 mmol/L   O2 Saturation 62 %   Acid-base deficit 2.0 0.0 - 2.0 mmol/L   Sodium 141 135 - 145 mmol/L   Potassium 4.0 3.5 - 5.1 mmol/L   Calcium, Ion 1.33 1.15 - 1.40 mmol/L   HCT 39.0 39.0 - 52.0 %   Hemoglobin 13.3 13.0 - 17.0 g/dL   Sample type VENOUS    Comment NOTIFIED PHYSICIAN   CBG monitoring, ED     Status: Abnormal   Collection Time: 04/10/23 12:25 PM  Result Value Ref Range   Glucose-Capillary 105 (H) 70 - 99 mg/dL    Comment: Glucose reference range applies only to samples taken after fasting for at least 8 hours.  Lactic acid, plasma     Status: None   Collection Time: 04/10/23 12:30 PM  Result Value Ref Range   Lactic Acid, Venous 1.5 0.5 - 1.9 mmol/L    Comment: Performed at [facility], [address] [place], [place] [address]  Troponin I (High Sensitivity)     Status: Abnormal   Collection Time: 04/10/23 12:30 PM  Result Value Ref Range   Troponin  I (High Sensitivity) 21 (H) <18 ng/L    Comment: (NOTE) Elevated high sensitivity  troponin I (hsTnI) values and significant  changes across serial measurements may suggest ACS but many other  chronic and acute conditions are known to elevate hsTnI results.  Refer to the "Links" section for chest pain algorithms and additional  guidance. Performed at [facility], [address], [place], [place] [address]    CT Head Wo Contrast  Result Date: 04/10/2023 CLINICAL DATA:  Seizure EXAM: CT HEAD WITHOUT CONTRAST TECHNIQUE: Contiguous axial images were obtained from the base of the skull through the vertex without intravenous contrast. RADIATION DOSE REDUCTION: This exam was performed according to the departmental dose-optimization program which includes automated exposure control, adjustment of the mA and/or kV according to patient size and/or use of iterative reconstruction technique. COMPARISON:  CT head 01/20/22 FINDINGS: Brain: Redemonstrated large right MCA territory infarct, unchanged from prior exam. No hemorrhage. No CT evidence of a new cortical infarct. No hydrocephalus. No extra-axial fluid collection. Moderate chronic microvascular ischemic change. Vascular: No hyperdense vessel or unexpected calcification. Skull: Normal. Negative for fracture or focal lesion. Sinuses/Orbits: No middle ear or mastoid effusion. Polypoid mucosal thickening right maxillary sinus. Orbits are unremarkable. Other: None. IMPRESSION: Redemonstrated large right MCA territory infarct, unchanged from prior exam. No hemorrhage or CT evidence of a new cortical infarct. Electronically Signed   By: Lorenza Cambridge M.D.   On: 04/10/2023 12:42   DG Chest 2 View  Result Date: 04/10/2023 CLINICAL DATA:  Aspiration EXAM: CHEST - 2 VIEW COMPARISON:  CXR 03/24/22 FINDINGS: No pleural effusion. No pneumothorax. Normal cardiac and mediastinal contours. Aortic atherosclerotic calcifications. There is a hazy opacity at the left lung base, which could represent atelectasis, infection, or aspiration. No radiographically  apparent displaced rib fractures. Visualized upper abdomen is unremarkable. Vertebral body heights are maintained. IMPRESSION: Hazy opacity at the left lung base could represent atelectasis, infection, or aspiration. Electronically Signed   By: Lorenza Cambridge M.D.   On: 04/10/2023 12:35    Pending Labs Unresulted Labs (From admission, onward)     Start     Ordered   04/10/23 1142  Lacosamide  Once,   URGENT        04/10/23 1141   04/10/23 1142  Levetiracetam level  Once,   URGENT        04/10/23 1141            Vitals/Pain Today's Vitals   04/10/23 1325 04/10/23 1328 04/10/23 1330 04/10/23 1400  BP: 114/71  107/63 113/62  Pulse:      Resp: 12  15 (!) 7  Temp:  98.1 F (36.7 C)    TempSrc:  Oral    SpO2:      PainSc:        Isolation Precautions No active isolations  Medications Medications  Ampicillin-Sulbactam (UNASYN) 3 g in sodium chloride 0.9 % 100 mL IVPB (has no administration in time range)  sodium chloride 0.9 % bolus 1,000 mL (1,000 mLs Intravenous New Bag/Given 04/10/23 1319)    Mobility walks with person assist     Focused Assessments Cardiac Assessment Handoff:  Cardiac Rhythm: Normal sinus rhythm (HR  70) Lab Results  Component Value Date   CKTOTAL 77 06/27/2019   CKMB 3.2 06/27/2019   Lab Results  Component Value Date   DDIMER >20.00 (H) 06/28/2019   Does the Patient currently have chest pain? No    R Recommendations: See Admitting Provider Note  Report given to:   Additional Notes:  Questionable Sz. Spoke with daughters. A&O with confusion, easily redirected. Hypotensive but improved.

## 2023-04-10 NOTE — ED Notes (Addendum)
[name] daughter updated via phone. BP improving. IVF infusing.

## 2023-04-11 DIAGNOSIS — G40909 Epilepsy, unspecified, not intractable, without status epilepticus: Secondary | ICD-10-CM | POA: Diagnosis not present

## 2023-04-11 LAB — CBC
HCT: 36 % — ABNORMAL LOW (ref 39.0–52.0)
Hemoglobin: 12.1 g/dL — ABNORMAL LOW (ref 13.0–17.0)
MCH: 29.4 pg (ref 26.0–34.0)
MCHC: 33.6 g/dL (ref 30.0–36.0)
MCV: 87.6 fL (ref 80.0–100.0)
Platelets: 125 10*3/uL — ABNORMAL LOW (ref 150–400)
RBC: 4.11 MIL/uL — ABNORMAL LOW (ref 4.22–5.81)
RDW: 13.3 % (ref 11.5–15.5)
WBC: 6.8 10*3/uL (ref 4.0–10.5)
nRBC: 0 % (ref 0.0–0.2)

## 2023-04-11 LAB — BASIC METABOLIC PANEL
Anion gap: 10 (ref 5–15)
BUN: 9 mg/dL (ref 8–23)
CO2: 25 mmol/L (ref 22–32)
Calcium: 9.3 mg/dL (ref 8.9–10.3)
Chloride: 103 mmol/L (ref 98–111)
Creatinine, Ser: 0.87 mg/dL (ref 0.61–1.24)
GFR, Estimated: >60 mL/min (ref >60)
Glucose, Bld: 110 mg/dL — ABNORMAL HIGH (ref 70–99)
Potassium: 3 mmol/L — ABNORMAL LOW (ref 3.5–5.1)
Sodium: 138 mmol/L (ref 135–145)

## 2023-04-11 LAB — LEVETIRACETAM LEVEL: Levetiracetam Lvl: 74.2 ug/mL — ABNORMAL HIGH (ref 10.0–40.0)

## 2023-04-11 MED ORDER — POTASSIUM CHLORIDE CRYS ER 20 MEQ PO TBCR
40.0000 meq | EXTENDED_RELEASE_TABLET | ORAL | Status: AC
  Administered 2023-04-11 (×2): 40 meq via ORAL
  Filled 2023-04-11 (×2): qty 2

## 2023-04-11 MED ORDER — POTASSIUM CHLORIDE CRYS ER 20 MEQ PO TBCR: 40.0000 meq | EXTENDED_RELEASE_TABLET | Freq: Every day | ORAL | 0 refills | Status: DC

## 2023-04-11 NOTE — Evaluation (Signed)
Physical Therapy Evaluation Patient Details Name: Travis Salazar. MRN: [id number] DOB: 05-Apr-1936 Today's Date: 04/11/2023  History of Present Illness  87 y.o. male presents to [facility] hospital on 04/10/2023 with slurred speech, preceded by dizziness and vomiting, with possible seizure. PMH includes HTN, HLD, CVA with seizures, prostate CA s/p prostatectomy.  Clinical Impression  Pt presents to PT with deficits in vision, gait, balance, but appears to likely be close to his baseline based on pt report. Pt is missing his glasses this session, with very poor vision and requiring verbal and tactile cues for guidance. Pt is able to ambulate for limited community distances with support of IV pole. Pt will benefit from continued acute PT services to maintain his current level of function. PT recommends discharge home with no post-acute follow-up.       Recommendations for follow up therapy are one component of a multi-disciplinary discharge planning process, led by the attending physician.  Recommendations may be updated based on patient status, additional functional criteria and insurance authorization.  Follow Up Recommendations       Assistance Recommended at Discharge Frequent or constant Supervision/Assistance  Patient can return home with the following  A little help with walking and/or transfers;A little help with bathing/dressing/bathroom;Assistance with cooking/housework;Direct supervision/assist for medications management;Direct supervision/assist for financial management;Assist for transportation;Help with stairs or ramp for entrance    Equipment Recommendations None recommended by PT  Recommendations for Other Services       Functional Status Assessment Patient has not had a recent decline in their functional status (appears at or near baseline)     Precautions / Restrictions Precautions Precautions: Fall Precaution Comments: very impaired vision, wears glasses but not  present Restrictions Weight Bearing Restrictions: No      Mobility  Bed Mobility Overal bed mobility: Needs Assistance Bed Mobility: Supine to Sit     Supine to sit: Supervision          Transfers Overall transfer level: Needs assistance Equipment used: None Transfers: Sit to/from Stand Sit to Stand: Supervision                Ambulation/Gait Ambulation/Gait assistance: Min guard Gait Distance (Feet): 300 Feet Assistive device: IV Pole Gait Pattern/deviations: Step-through pattern, Trunk flexed Gait velocity: reduced Gait velocity interpretation: 1.31 - 2.62 ft/sec, indicative of limited community ambulator   General Gait Details: pt with significant vision deficits, requiring verbal and tactile cues for direction  Stairs            Wheelchair Mobility    Modified Rankin (Stroke Patients Only)       Balance Overall balance assessment: Needs assistance Sitting-balance support: No upper extremity supported, Feet supported Sitting balance-Leahy Scale: Good     Standing balance support: Single extremity supported, Reliant on assistive device for balance Standing balance-Leahy Scale: Poor                               Pertinent Vitals/Pain Pain Assessment Pain Assessment: No/denies pain    Home Living Family/patient expects to be discharged to:: Private residence Living Arrangements: Children Available Help at Discharge: Family;Available 24 hours/day Type of Home: House Home Access: Stairs to enter Entrance Stairs-Rails: None Entrance Stairs-Number of Steps: 1   Home Layout: One level Home Equipment: Cane - single point;Shower seat;Grab bars - tub/shower      Prior Function Prior Level of Function : Needs assist  Mobility Comments: ambulates with SPC ADLs Comments: family assist with cooking/cleaning, IADLs and transport     Hand Dominance   Dominant Hand: Right    Extremity/Trunk Assessment   Upper  Extremity Assessment Upper Extremity Assessment: Overall WFL for tasks assessed    Lower Extremity Assessment Lower Extremity Assessment: Overall WFL for tasks assessed    Cervical / Trunk Assessment Cervical / Trunk Assessment: Kyphotic  Communication   Communication: No difficulties  Cognition Arousal/Alertness: Awake/alert Behavior During Therapy: WFL for tasks assessed/performed Overall Cognitive Status: History of cognitive impairments - at baseline                                 General Comments: pt appears to have some reduced awareness of deficits, significantly impaired vision, reports he has not driven in a while but also states he is not sure if he will be able to get his license or not.        General Comments General comments (skin integrity, edema, etc.): VSS on RA    Exercises     Assessment/Plan    PT Assessment Patient needs continued PT services  PT Problem List Decreased balance;Decreased mobility;Decreased cognition;Decreased knowledge of use of DME;Decreased safety awareness;Decreased knowledge of precautions       PT Treatment Interventions DME instruction;Gait training;Stair training;Functional mobility training;Therapeutic activities;Therapeutic exercise;Balance training;Neuromuscular re-education;Patient/family education    PT Goals (Current goals can be found in the Care Plan section)  Acute Rehab PT Goals Patient Stated Goal: to go home PT Goal Formulation: With patient Time For Goal Achievement: 04/25/23 Potential to Achieve Goals: Good    Frequency Min 2X/week     Co-evaluation               AM-PAC PT "6 Clicks" Mobility  Outcome Measure Help needed turning from your back to your side while in a flat bed without using bedrails?: A Little Help needed moving from lying on your back to sitting on the side of a flat bed without using bedrails?: A Little Help needed moving to and from a bed to a chair (including a  wheelchair)?: A Little Help needed standing up from a chair using your arms (e.g., wheelchair or bedside chair)?: A Little Help needed to walk in hospital room?: A Little Help needed climbing 3-5 steps with a railing? : A Little 6 Click Score: 18    End of Session   Activity Tolerance: Patient tolerated treatment well Patient left: in bed;with call bell/phone within reach;with bed alarm set Nurse Communication: Mobility status PT Visit Diagnosis: Other abnormalities of gait and mobility (R26.89);Other symptoms and signs involving the nervous system (R29.898)    Time: 0981-1914 PT Time Calculation (min) (ACUTE ONLY): 21 min   Charges:   PT Evaluation $PT Eval Low Complexity: 1 Low          Arlyss Gandy, PT, DPT Acute Rehabilitation Office [phone]   Arlyss Gandy 04/11/2023, 11:59 AM

## 2023-04-11 NOTE — Plan of Care (Signed)
  Problem: Education: Goal: Expressions of having a comfortable level of knowledge regarding the disease process will increase Outcome: Adequate for Discharge   Problem: Coping: Goal: Ability to adjust to condition or change in health will improve Outcome: Adequate for Discharge Goal: Ability to identify appropriate support needs will improve Outcome: Adequate for Discharge   Problem: Health Behavior/Discharge Planning: Goal: Compliance with prescribed medication regimen will improve Outcome: Adequate for Discharge   Problem: Medication: Goal: Risk for medication side effects will decrease Outcome: Adequate for Discharge   Problem: Clinical Measurements: Goal: Complications related to the disease process, condition or treatment will be avoided or minimized Outcome: Adequate for Discharge Goal: Diagnostic test results will improve Outcome: Adequate for Discharge   Problem: Safety: Goal: Verbalization of understanding the information provided will improve Outcome: Adequate for Discharge   Problem: Self-Concept: Goal: Level of anxiety will decrease Outcome: Adequate for Discharge Goal: Ability to verbalize feelings about condition will improve Outcome: Adequate for Discharge   Problem: Education: Goal: Knowledge of General Education information will improve Description: Including pain rating scale, medication(s)/side effects and non-pharmacologic comfort measures Outcome: Adequate for Discharge   Problem: Health Behavior/Discharge Planning: Goal: Ability to manage health-related needs will improve Outcome: Adequate for Discharge   Problem: Clinical Measurements: Goal: Ability to maintain clinical measurements within normal limits will improve Outcome: Adequate for Discharge Goal: Will remain free from infection Outcome: Adequate for Discharge Goal: Diagnostic test results will improve Outcome: Adequate for Discharge Goal: Respiratory complications will improve Outcome:  Adequate for Discharge Goal: Cardiovascular complication will be avoided Outcome: Adequate for Discharge   Problem: Activity: Goal: Risk for activity intolerance will decrease Outcome: Adequate for Discharge   Problem: Nutrition: Goal: Adequate nutrition will be maintained Outcome: Adequate for Discharge   Problem: Coping: Goal: Level of anxiety will decrease Outcome: Adequate for Discharge   Problem: Elimination: Goal: Will not experience complications related to bowel motility Outcome: Adequate for Discharge Goal: Will not experience complications related to urinary retention Outcome: Adequate for Discharge   Problem: Pain Managment: Goal: General experience of comfort will improve Outcome: Adequate for Discharge   Problem: Safety: Goal: Ability to remain free from injury will improve Outcome: Adequate for Discharge   Problem: Skin Integrity: Goal: Risk for impaired skin integrity will decrease Outcome: Adequate for Discharge   

## 2023-04-11 NOTE — Discharge Summary (Signed)
Physician Discharge Summary  Travis Salazar. OZH:[id number] DOB: May 07, 1936 DOA: 04/10/2023  PCP: Ronnald Ramp, MD  Admit date: 04/10/2023 Discharge date: 04/11/2023  Admitted From: Home Disposition: Home  Recommendations for Outpatient Follow-up:  Follow up with PCP in 1-2 weeks Follow-up with neurology  Home Health: N/A Equipment/Devices: N/A  Discharge Condition: Stable CODE STATUS: Full code Diet recommendation: Low-salt diet  Discharge summary: 87 year old with history of hypertension, hyperlipidemia, history of stroke with poststroke seizures, prostate cancer status post prostatectomy who presented to the ER with episode of slurred speech and dizziness when he woke up in the morning.  Apparently he was at home and eating breakfast when he groaned and complained of dizziness.  He had 1 episode of vomiting and was slurring.  He was given intranasal Valium and since then his symptoms have been improved.  He does have history of refractory seizure and followed by neurology.  Currently on Vimpat and Keppra along with as needed diazepam.  In the emergency room his head CT was normal.  Labs were fairly normal.  Potassium is low and this was replaced.  Initial chest x-ray showed possible left lower lobe infiltrate, however his procalcitonin was normal and he had no pulmonary symptoms.  Case was discussed with neurology in the emergency room who suggested observation, no change in seizure medications but to follow-up outpatient. Patient was observed overnight, he did fairly well.  Currently asymptomatic.  He does have some ambulatory dysfunction and uses cane to walk.  His blood pressures are adequate.  His potassium is replaced and will continue to provide some oral replacement.  He has adequate support system at home, he has 24/7 family support system.  Stable to discharge.  He will follow-up with neurology as outpatient.   Discharge Diagnoses:  Principal Problem:   Seizure  disorder [facility]) Active Problems:   Hypercholesterolemia   Aortic stenosis   Essential hypertension   Aspiration pneumonia (HCC)   Glaucoma (increased eye pressure)    Discharge Instructions  Discharge Instructions     Diet general   Complete by: As directed    Increase activity slowly   Complete by: As directed       Allergies as of 04/11/2023   No Known Allergies      Medication List     TAKE these medications    acetaminophen 500 MG tablet Commonly known as: TYLENOL Take 500 mg by mouth daily as needed (back pain).   amLODipine 10 MG tablet Commonly known as: NORVASC Take 1 tablet (10 mg total) by mouth daily.   apixaban 5 MG Tabs tablet Commonly known as: Eliquis Take 1 tablet (5 mg total) by mouth 2 (two) times daily.   atorvastatin 40 MG tablet Commonly known as: LIPITOR Take 1 tablet (40 mg total) by mouth daily at 6 PM.   brimonidine 0.2 % ophthalmic solution Commonly known as: ALPHAGAN Place 1 drop into both eyes 2 (two) times daily.   DIAZEPAM PO Take 1 tablet by mouth as needed (seizures).   dorzolamide 2 % ophthalmic solution Commonly known as: TRUSOPT Place 1 drop into both eyes 2 (two) times daily.   lacosamide 50 MG Tabs tablet Commonly known as: Vimpat Take 1 tablet (50 mg total) by mouth 2 (two) times daily.   latanoprost 0.005 % ophthalmic solution Commonly known as: XALATAN Place 1 drop into both eyes at bedtime.   levETIRAcetam 1000 MG tablet Commonly known as: KEPPRA Take 2 tablets (2,000 mg total) by mouth 2 (two) times  daily.   metoprolol tartrate 25 MG tablet Commonly known as: LOPRESSOR Take 0.5 tablets (12.5 mg total) by mouth 2 (two) times daily.   mirtazapine 15 MG disintegrating tablet Commonly known as: REMERON SOL-TAB Take 1 tablet (15 mg total) by mouth at bedtime. What changed:  when to take this additional instructions   potassium chloride SA 20 MEQ tablet Commonly known as: KLOR-CON M Take 2 tablets (40  mEq total) by mouth daily for 2 days.   timolol 0.5 % ophthalmic solution Commonly known as: BETIMOL Place 1 drop into both eyes 2 (two) times daily.   Valtoco 15 MG Dose 2 x 7.5 MG/0.1ML Lqpk Generic drug: diazePAM (15 MG Dose) Spray in each nostril as needed for seizure. May use second dose after 4 hours if needed. What changed:  how much to take how to take this when to take this additional instructions        No Known Allergies  Consultations: Neurology, curbside by ER physician as documented.   Procedures/Studies: CT Head Wo Contrast  Result Date: 04/10/2023 CLINICAL DATA:  Seizure EXAM: CT HEAD WITHOUT CONTRAST TECHNIQUE: Contiguous axial images were obtained from the base of the skull through the vertex without intravenous contrast. RADIATION DOSE REDUCTION: This exam was performed according to the departmental dose-optimization program which includes automated exposure control, adjustment of the mA and/or kV according to patient size and/or use of iterative reconstruction technique. COMPARISON:  CT head 01/20/22 FINDINGS: Brain: Redemonstrated large right MCA territory infarct, unchanged from prior exam. No hemorrhage. No CT evidence of a new cortical infarct. No hydrocephalus. No extra-axial fluid collection. Moderate chronic microvascular ischemic change. Vascular: No hyperdense vessel or unexpected calcification. Skull: Normal. Negative for fracture or focal lesion. Sinuses/Orbits: No middle ear or mastoid effusion. Polypoid mucosal thickening right maxillary sinus. Orbits are unremarkable. Other: None. IMPRESSION: Redemonstrated large right MCA territory infarct, unchanged from prior exam. No hemorrhage or CT evidence of a new cortical infarct. Electronically Signed   By: Lorenza Cambridge M.D.   On: 04/10/2023 12:42   DG Chest 2 View  Result Date: 04/10/2023 CLINICAL DATA:  Aspiration EXAM: CHEST - 2 VIEW COMPARISON:  CXR 03/24/22 FINDINGS: No pleural effusion. No pneumothorax.  Normal cardiac and mediastinal contours. Aortic atherosclerotic calcifications. There is a hazy opacity at the left lung base, which could represent atelectasis, infection, or aspiration. No radiographically apparent displaced rib fractures. Visualized upper abdomen is unremarkable. Vertebral body heights are maintained. IMPRESSION: Hazy opacity at the left lung base could represent atelectasis, infection, or aspiration. Electronically Signed   By: Lorenza Cambridge M.D.   On: 04/10/2023 12:35   (Echo, Carotid, EGD, Colonoscopy, ERCP)    Subjective: Patient seen and examined.  Pleasant and interactive.  Full of sense of humor.  Denies any complaints.  He tells me he feels slightly weaker but he walks with a cane and he is fine.   Discharge Exam: Vitals:   04/11/23 0533 04/11/23 0830  BP: 105/68   Pulse: 72 72  Resp: 15   Temp: 98.6 F (37 C)   SpO2: 100% 100%   Vitals:   04/10/23 1947 04/11/23 0007 04/11/23 0533 04/11/23 0830  BP: 112/61 (!) 104/56 105/68   Pulse: 71 84 72 72  Resp: 17 16 15    Temp: 97.9 F (36.6 C)  98.6 F (37 C)   TempSrc:   Oral Oral  SpO2: 100% 100% 100% 100%  Weight:      Height:  General: Pt is alert, awake, not in acute distress He has poor vision both eyes. Cardiovascular: RRR, S1/S2 +, no rubs, no gallops Respiratory: CTA bilaterally, no wheezing, no rhonchi Abdominal: Soft, NT, ND, bowel sounds + Extremities: no edema, no cyanosis    The results of significant diagnostics from this hospitalization (including imaging, microbiology, ancillary and laboratory) are listed below for reference.     Microbiology: No results found for this or any previous visit (from the past 240 hour(s)).   Labs: BNP (last 3 results) No results for input(s): "BNP" in the last 8760 hours. Basic Metabolic Panel: Recent Labs  Lab 04/10/23 1115 04/10/23 1137 04/11/23 0428  NA 139 141 138  K 3.8 4.0 3.0*  CL 106  --  103  CO2 26  --  25  GLUCOSE 152*  --   110*  BUN 11  --  9  CREATININE 1.05  --  0.87  CALCIUM 9.2  --  9.3   Liver Function Tests: Recent Labs  Lab 04/10/23 1115  AST 19  ALT 16  ALKPHOS 47  BILITOT 0.4  PROT 6.1*  ALBUMIN 3.2*   Recent Labs  Lab 04/10/23 1115  LIPASE 27   No results for input(s): "AMMONIA" in the last 168 hours. CBC: Recent Labs  Lab 04/10/23 1115 04/10/23 1137 04/11/23 0428  WBC 6.3  --  6.8  NEUTROABS 4.1  --   --   HGB 12.5* 13.3 12.1*  HCT 38.9* 39.0 36.0*  MCV 90.0  --  87.6  PLT 134*  --  125*   Cardiac Enzymes: No results for input(s): "CKTOTAL", "CKMB", "CKMBINDEX", "TROPONINI" in the last 168 hours. BNP: Invalid input(s): "POCBNP" CBG: Recent Labs  Lab 04/10/23 1225  GLUCAP 105*   D-Dimer No results for input(s): "DDIMER" in the last 72 hours. Hgb A1c No results for input(s): "HGBA1C" in the last 72 hours. Lipid Profile No results for input(s): "CHOL", "HDL", "LDLCALC", "TRIG", "CHOLHDL", "LDLDIRECT" in the last 72 hours. Thyroid function studies No results for input(s): "TSH", "T4TOTAL", "T3FREE", "THYROIDAB" in the last 72 hours.  Invalid input(s): "FREET3" Anemia work up No results for input(s): "VITAMINB12", "FOLATE", "FERRITIN", "TIBC", "IRON", "RETICCTPCT" in the last 72 hours. Urinalysis    Component Value Date/Time   COLORURINE YELLOW 03/24/2022 2250   APPEARANCEUR HAZY (A) 03/24/2022 2250   LABSPEC 1.011 03/24/2022 2250   PHURINE 7.0 03/24/2022 2250   GLUCOSEU NEGATIVE 03/24/2022 2250   HGBUR NEGATIVE 03/24/2022 2250   BILIRUBINUR NEGATIVE 03/24/2022 2250   KETONESUR NEGATIVE 03/24/2022 2250   PROTEINUR NEGATIVE 03/24/2022 2250   NITRITE NEGATIVE 03/24/2022 2250   LEUKOCYTESUR NEGATIVE 03/24/2022 2250   Sepsis Labs Recent Labs  Lab 04/10/23 1115 04/11/23 0428  WBC 6.3 6.8   Microbiology No results found for this or any previous visit (from the past 240 hour(s)).   Time coordinating discharge:  32 minutes  SIGNED:   Dorcas Carrow,  MD  [facility] 04/11/2023, 11:21 AM

## 2023-04-11 NOTE — Progress Notes (Signed)
OT Screen Note  Patient Details Name: Travis Salazar. MRN: [id number] DOB: [birth year]   Cancelled Treatment:    Reason Eval/Treat Not Completed: OT screened, no needs identified, will sign off OT communicated with PT regarding near baseline and has baseline visual deficits. Pt has family support upon d/c   [name] 04/11/2023, 2:22 PM

## 2023-04-14 LAB — LACOSAMIDE: Lacosamide: 5.2 ug/mL (ref 5.0–10.0)

## 2023-04-15 ENCOUNTER — Ambulatory Visit (INDEPENDENT_AMBULATORY_CARE_PROVIDER_SITE_OTHER): Payer: Medicare HMO

## 2023-04-15 VITALS — Ht 68.0 in | Wt 213.0 lb

## 2023-04-15 DIAGNOSIS — Z0000 Encounter for general adult medical examination without abnormal findings: Secondary | ICD-10-CM | POA: Diagnosis not present

## 2023-04-15 NOTE — Progress Notes (Signed)
I connected with  Travis Salazar. on 04/15/23 by a audio enabled telemedicine application and verified that I am speaking with the correct person using two identifiers.  Patient Location: Home  Provider Location: Office/Clinic  I discussed the limitations of evaluation and management by telemedicine. The patient expressed understanding and agreed to proceed.  Subjective:   Travis Salazar. is a 87 y.o. male who presents for Medicare Annual/Subsequent preventive examination.  Review of Systems    Cardiac Risk Factors include: advanced age (>23men, >56 women);hypertension;male gender;obesity (BMI >30kg/m2)    Objective:    Today's Vitals   04/15/23 1337 04/15/23 1338  Weight: 213 lb (96.6 kg)   Height: 5\' 8"  (1.727 m)   PainSc:  0-No pain   Body mass index is 32.39 kg/m.     04/15/2023    1:46 PM 04/10/2023    2:00 PM 02/18/2023    3:01 PM 07/22/2022    3:53 PM 04/08/2022   12:51 PM 04/02/2022    2:44 PM 01/22/2022    8:11 AM  Advanced Directives  Does Patient Have a Medical Advance Directive? Yes Yes Yes Yes Yes Yes Yes  Type of Estate agent of [place];Living will Living will Living will Healthcare Power of [employer] Power of [place];Living will Healthcare Power of [place];Living will;Out of facility DNR (pink MOST or yellow form)   Does patient want to make changes to medical advance directive?  No - Patient declined   Yes (Inpatient - patient defers changing a medical advance directive and declines information at this time)    Copy of Healthcare Power of Attorney in Chart? No - copy requested    No - copy requested    Would patient like information on creating a medical advance directive?     No - Patient declined      Current Medications (verified) Outpatient Encounter Medications as of 04/15/2023  Medication Sig   acetaminophen (TYLENOL) 500 MG tablet Take 500 mg by mouth daily as needed (back pain).   amLODipine (NORVASC) 10 MG tablet Take 1  tablet (10 mg total) by mouth daily.   apixaban (ELIQUIS) 5 MG TABS tablet Take 1 tablet (5 mg total) by mouth 2 (two) times daily.   atorvastatin (LIPITOR) 40 MG tablet Take 1 tablet (40 mg total) by mouth daily at 6 PM.   brimonidine (ALPHAGAN) 0.2 % ophthalmic solution Place 1 drop into both eyes 2 (two) times daily.   DIAZEPAM PO Take 1 tablet by mouth as needed (seizures).   diazePAM, 15 MG Dose, (VALTOCO 15 MG DOSE) 2 x 7.5 MG/0.1ML LQPK Spray in each nostril as needed for seizure. May use second dose after 4 hours if needed. (Patient taking differently: Place 15 mg into the nose See admin instructions. 15 mg in nostril as needed for seizures. May use second dose after 4 hours if needed.)   dorzolamide (TRUSOPT) 2 % ophthalmic solution Place 1 drop into both eyes 2 (two) times daily.   lacosamide (VIMPAT) 50 MG TABS tablet Take 1 tablet (50 mg total) by mouth 2 (two) times daily.   latanoprost (XALATAN) 0.005 % ophthalmic solution Place 1 drop into both eyes at bedtime.   levETIRAcetam (KEPPRA) 1000 MG tablet Take 2 tablets (2,000 mg total) by mouth 2 (two) times daily.   metoprolol tartrate (LOPRESSOR) 25 MG tablet Take 0.5 tablets (12.5 mg total) by mouth 2 (two) times daily.   mirtazapine (REMERON SOL-TAB) 15 MG disintegrating tablet Take 1 tablet (15 mg  total) by mouth at bedtime. (Patient taking differently: Take 15 mg by mouth See admin instructions. 15 mg every other night. Tapering off.)   timolol (BETIMOL) 0.5 % ophthalmic solution Place 1 drop into both eyes 2 (two) times daily.   potassium chloride SA (KLOR-CON M) 20 MEQ tablet Take 2 tablets (40 mEq total) by mouth daily for 2 days.   No facility-administered encounter medications on file as of 04/15/2023.    Allergies (verified) Patient has no known allergies.   History: Past Medical History:  Diagnosis Date   Hyperlipidemia    Hypertension    Seizures (HCC)    Stroke [facility])    Past Surgical History:  Procedure  Laterality Date   LIPOMA EXCISION     neck   PROSTATECTOMY     due to prostate cancer   Family History  Problem Relation Age of Onset   COPD Mother    Heart attack Father    Social History   Socioeconomic History   Marital status: Married    Spouse name: [name]   Number of children: 4   Years of education: 10th grade   Highest education level: 9th grade  Occupational History   Occupation: retired  Tobacco Use   Smoking status: Former    Packs/day: 1.00    Years: 50.00    Additional pack years: 0.00    Total pack years: 50.00    Types: Cigarettes    Quit date: 12/15/2009    Years since quitting: 13.3   Smokeless tobacco: Never  Vaping Use   Vaping Use: Never used  Substance and Sexual Activity   Alcohol use: No    Alcohol/week: 0.0 standard drinks of alcohol   Drug use: No   Sexual activity: Not on file  Other Topics Concern   Not on file  Social History Narrative   ** Merged History Encounter ** Right handed    Caffeine 2 cups daily   Home is one story   Social Determinants of Health   Financial Resource Strain: Low Risk  (04/15/2023)   Overall Financial Resource Strain (CARDIA)    Difficulty of Paying Living Expenses: Not hard at all  Food Insecurity: No Food Insecurity (04/15/2023)   Hunger Vital Sign    Worried About Running Out of Food in the Last Year: Never true    Ran Out of Food in the Last Year: Never true  Transportation Needs: No Transportation Needs (04/15/2023)   PRAPARE - Administrator, Civil Service (Medical): No    Lack of Transportation (Non-Medical): No  Physical Activity: Sufficiently Active (04/15/2023)   Exercise Vital Sign    Days of Exercise per Week: 5 days    Minutes of Exercise per Session: 30 min  Stress: No Stress Concern Present (04/15/2023)   [employer] of Occupational Health - Occupational Stress Questionnaire    Feeling of Stress : Not at all  Social Connections: Moderately Isolated (04/15/2023)   Social  Connection and Isolation Panel [NHANES]    Frequency of Communication with Friends and Family: More than three times a week    Frequency of Social Gatherings with Friends and Family: Once a week    Attends Religious Services: More than 4 times per year    Active Member of [employer] or Organizations: No    Attends Banker Meetings: Never    Marital Status: Widowed    Tobacco Counseling Counseling given: Not Answered   Clinical Intake:  Pre-visit preparation completed: Yes  Pain : No/denies pain Pain Score: 0-No pain     BMI - recorded: 32.39 Nutritional Status: BMI > 30  Obese Nutritional Risks: None Diabetes: No  How often do you need to have someone help you when you read instructions, pamphlets, or other written materials from your doctor or pharmacy?: 1 - Never  Diabetic?no  Interpreter Needed?: No  Comments: lives with oldest son Information entered by :: B.Armari Fussell,LPN   Activities of Daily Living    04/15/2023    1:46 PM 04/10/2023    5:35 PM  In your present state of health, do you have any difficulty performing the following activities:  Hearing? 0 0  Vision? 1 0  Difficulty concentrating or making decisions? 0 0  Walking or climbing stairs? 0 0  Dressing or bathing? 0 0  Doing errands, shopping? 0 0  Preparing Food and eating ? N   Using the Toilet? N   In the past six months, have you accidently leaked urine? N   Do you have problems with loss of bowel control? N   Managing your Medications? N   Managing your Finances? N   Housekeeping or managing your Housekeeping? N     Patient Care Team: Ronnald Ramp, MD as PCP - General (Family Medicine) Jake Bathe, MD as PCP - Cardiology (Cardiology) [facility], [facility] as Consulting Physician (Optometry) Lonna Cobb, Verna Czech, MD as Consulting Physician (Urology) Gaspar Cola, [facility] (Pharmacist) Van Clines, MD as Consulting Physician (Neurology) Bosie Clos, MD  (Family Medicine)  Indicate any recent Medical Services you may have received from other than [facility] providers in the past year (date may be approximate).     Assessment:   This is a routine wellness examination for Hamed.  Hearing/Vision screen Hearing Screening - Comments:: Adequate Hearing  Vision Screening - Comments:: Inadequate vision:blurry [facility]  Dietary issues and exercise activities discussed: Current Exercise Habits: Home exercise routine, Type of exercise: walking, Time (Minutes): 30, Frequency (Times/Week): 3, Weekly Exercise (Minutes/Week): 90, Intensity: Mild, Exercise limited by: cardiac condition(s);neurologic condition(s);respiratory conditions(s)   Goals Addressed             This Visit's Progress    DIET - REDUCE SUGAR INTAKE   On track    Recommend cutting back on daily desserts to a couple times a week.      Exercise 3x per week (30 min per time)   On track    Recommend to exercise for 3 days a week for at least 30 minutes at a time.        Depression Screen    04/15/2023    1:42 PM 02/05/2023    1:29 PM 04/08/2022   12:47 PM 02/12/2022    1:25 PM 09/18/2021   10:25 AM 06/20/2021    2:25 PM 04/17/2020    9:39 AM  PHQ 2/9 Scores  PHQ - 2 Score 0 0 2 2 0 0 0  PHQ- 9 Score   2 4       Fall Risk    04/15/2023    1:41 PM 02/18/2023    3:01 PM 02/05/2023    1:29 PM 07/22/2022    3:52 PM 04/08/2022   12:52 PM  Fall Risk   Falls in the past year? 0 0 0 0 0  Number falls in past yr: 0 0 0 0 0  Injury with Fall? 0 0 0 0 0  Risk for fall due to : No Fall Risks  No Fall Risks  No Fall Risks  Follow up Falls prevention discussed;Education provided Falls evaluation completed   Falls evaluation completed    FALL RISK PREVENTION PERTAINING TO THE HOME:  Any stairs in or around the home? Yes one step If so, are there any without handrails? No  Home free of loose throw rugs in walkways, pet beds, electrical cords, etc? Yes  Adequate lighting in your home to  reduce risk of falls? Yes   ASSISTIVE DEVICES UTILIZED TO PREVENT FALLS:  Life alert? No  Use of a cane, walker or w/c? Yes cane Grab bars in the bathroom? Yes  Shower chair or bench in shower? Yes  Elevated toilet seat or a handicapped toilet? No   Cognitive Function:        04/15/2023    1:48 PM 04/17/2020    9:46 AM 04/14/2019    2:49 PM  6CIT Screen  What Year? 0 points 0 points 0 points  What month? 0 points 0 points 0 points  What time? 0 points 0 points 0 points  Count back from 20 0 points 0 points 0 points  Months in reverse 0 points 2 points 0 points  Repeat phrase 0 points 6 points 4 points  Total Score 0 points 8 points 4 points    Immunizations Immunization History  Administered Date(s) Administered   Influenza-Unspecified 10/16/2018   PFIZER(Purple Top)SARS-COV-2 Vaccination 12/18/2020   Pneumococcal Conjugate-13 10/04/2014   Pneumococcal Polysaccharide-23 01/11/2013   Zoster, Live 11/13/2012    TDAP status: Up to date  Flu Vaccine status: Up to date  Pneumococcal vaccine status: Up to date  Covid-19 vaccine status: Completed vaccines  Qualifies for Shingles Vaccine? Yes   Zostavax completed No   Shingrix Completed?: No.    Education has been provided regarding the importance of this vaccine. Patient has been advised to call insurance company to determine out of pocket expense if they have not yet received this vaccine. Advised may also receive vaccine at local pharmacy or Health Dept. Verbalized acceptance and understanding.  Screening Tests Health Maintenance  Topic Date Due   DTaP/Tdap/Td (1 - Tdap) Never done   Zoster Vaccines- Shingrix (1 of 2) Never done   COVID-19 Vaccine (2 - Pfizer risk series) 01/08/2021   INFLUENZA VACCINE  07/17/2023   Medicare Annual Wellness (AWV)  04/14/2024   Pneumonia Vaccine 58+ Years old  Completed   HPV VACCINES  Aged Out    Health Maintenance  Health Maintenance Due  Topic Date Due   DTaP/Tdap/Td (1 -  Tdap) Never done   Zoster Vaccines- Shingrix (1 of 2) Never done   COVID-19 Vaccine (2 - Pfizer risk series) 01/08/2021    Colorectal cancer screening: No longer required.   Lung Cancer Screening: (Low Dose CT Chest recommended if Age 56-80 years, 30 pack-year currently smoking OR have quit w/in 15years.) does not qualify.   Lung Cancer Screening Referral: no  Additional Screening:  Hepatitis C Screening: does not qualify; Completed yes  Vision Screening: Recommended annual ophthalmology exams for early detection of glaucoma and other disorders of the eye. Is the patient up to date with their annual eye exam?  Yes  Who is the provider or what is the name of the office in which the patient attends annual eye exams? [facility] If pt is not established with a provider, would they like to be referred to a provider to establish care? No .   Dental Screening: Recommended annual dental exams for proper  oral hygiene  Community Resource Referral / Chronic Care Management: CRR required this visit?  No   CCM required this visit?  No      Plan:     I have personally reviewed and noted the following in the patient's chart:   Medical and social history Use of alcohol, tobacco or illicit drugs  Current medications and supplements including opioid prescriptions. Patient is not currently taking opioid prescriptions. Functional ability and status Nutritional status Physical activity Advanced directives List of other physicians Hospitalizations, surgeries, and ER visits in previous 12 months Vitals Screenings to include cognitive, depression, and falls Referrals and appointments  In addition, I have reviewed and discussed with patient certain preventive protocols, quality metrics, and best practice recommendations. A written personalized care plan for preventive services as well as general preventive health recommendations were provided to patient.     Sue Lush,  LPN   [id number]   Nurse Notes: The patient states he is doing well and has no concerns or questions at this time. He does relay that he is having blurred vision and has appt at [facility] this week. He does not drive and states his children take care of everything he needs.

## 2023-04-15 NOTE — Patient Instructions (Addendum)
[name] , Thank you for taking time to come for your Medicare Wellness Visit. I appreciate your ongoing commitment to your health goals. Please review the following plan we discussed and let me know if I can assist you in the future.   These are the goals we discussed:  Goals       DIET - EAT MORE FRUITS AND VEGETABLES      DIET - REDUCE SUGAR INTAKE      Recommend cutting back on daily desserts to a couple times a week.       Exercise 3x per week (30 min per time)      Recommend to exercise for 3 days a week for at least 30 minutes at a time.       Find Help in My Community      Timeframe:  Long-Range Goal Priority:  Medium Start Date:     09/18/21                        Expected End Date:    03/19/21                   Follow Up Date 11/28/21    - begin a notebook of services in my neighborhood or community - call 211 when I need some help - follow-up on any referrals for help I am given - think ahead to make sure my need does not become an emergency - have a back-up plan  -follow up on referral for [facility] for in home support   Why is this important?   Knowing how and where to find help for yourself or family in your neighborhood and community is an important skill.  You will want to take some steps to learn how.    Notes:       per wife "Travis Salazar is doing good from his stroke (pt-stated)      Current Barriers:  Chronic Disease Management support and education needs related to recent MCA stroke/covid +  Nurse Case Manager Clinical Goal(s):  Over the next 60 days, patient will not experience hospital admission. Hospital Admissions in last 6 months = 1 Over the next 30 days, patient will attend all scheduled medical appointments: 7/21 virtual visit with PCP, 8/19 hospital follow up with [facility]-goal met 08/05/2019 Over the next 60 days, patient will check BP 3 x week and record, calling Dr. Sullivan Lone with numbers outside discussed  parameters   Interventions:  Reviewed BP log Discussed importance of continued Covid infection prevention strategies as patient was under the impression he was not able to contract again Assessed for continued medication adherance Discussed importance of low sodium diet to maintain BP WNL Reviewed Neurology note from yesterdays appointment and discussed with patient  Patient Self Care Activities:  Self administers medications as prescribed Attends all scheduled provider appointments Calls pharmacy for medication refills Attends church or other social activities Performs ADL's independently Performs IADL's independently Calls provider office for new concerns or questions  Please see past updates related to this goal by clicking on the "Past Updates" button in the selected goal        Track and Manage My Blood Pressure-Hypertension      Timeframe:  Long-Range Goal Priority:  High Start Date:  11/14/21                           Expected End Date:  11/14/22                      Follow Up within 90 days   - check blood pressure weekly    Why is this important?   You won't feel high blood pressure, but it can still hurt your blood vessels.  High blood pressure can cause heart or kidney problems. It can also cause a stroke.  Making lifestyle changes like losing a little weight or eating less salt will help.  Checking your blood pressure at home and at different times of the day can help to control blood pressure.  If the doctor prescribes medicine remember to take it the way the doctor ordered.  Call the office if you cannot afford the medicine or if there are questions about it.     Notes:         This is a list of the screening recommended for you and due dates:  Health Maintenance  Topic Date Due   DTaP/Tdap/Td vaccine (1 - Tdap) Never done   Zoster (Shingles) Vaccine (1 of 2) Never done   COVID-19 Vaccine (2 - Pfizer risk series) 01/08/2021   Flu Shot  07/17/2023    Medicare Annual Wellness Visit  04/14/2024   Pneumonia Vaccine  Completed   HPV Vaccine  Aged Out    Advanced directives: yes  Conditions/risks identified: low falls risk  Next appointment: Follow up in one year for your annual wellness visit. 04/19/2024 @1pm  telephone  Preventive Care 65 Years and Older, Male  Preventive care refers to lifestyle choices and visits with your health care provider that can promote health and wellness. What does preventive care include? A yearly physical exam. This is also called an annual well check. Dental exams once or twice a year. Routine eye exams. Ask your health care provider how often you should have your eyes checked. Personal lifestyle choices, including: Daily care of your teeth and gums. Regular physical activity. Eating a healthy diet. Avoiding tobacco and drug use. Limiting alcohol use. Practicing safe sex. Taking low doses of aspirin every day. Taking vitamin and mineral supplements as recommended by your health care provider. What happens during an annual well check? The services and screenings done by your health care provider during your annual well check will depend on your age, overall health, lifestyle risk factors, and family history of disease. Counseling  Your health care provider may ask you questions about your: Alcohol use. Tobacco use. Drug use. Emotional well-being. Home and relationship well-being. Sexual activity. Eating habits. History of falls. Memory and ability to understand (cognition). Work and work Astronomer. Screening  You may have the following tests or measurements: Height, weight, and BMI. Blood pressure. Lipid and cholesterol levels. These may be checked every 5 years, or more frequently if you are over 24 years old. Skin check. Lung cancer screening. You may have this screening every year starting at age 48 if you have a 30-pack-year history of smoking and currently smoke or have quit within  the past 15 years. Fecal occult blood test (FOBT) of the stool. You may have this test every year starting at age 32. Flexible sigmoidoscopy or colonoscopy. You may have a sigmoidoscopy every 5 years or a colonoscopy every 10 years starting at age 61. Prostate cancer screening. Recommendations will vary depending on your family history and other risks. Hepatitis C blood test. Hepatitis B blood test. Sexually transmitted disease (STD) testing. Diabetes screening. This is done by checking  your blood sugar (glucose) after you have not eaten for a while (fasting). You may have this done every 1-3 years. Abdominal aortic aneurysm (AAA) screening. You may need this if you are a current or former smoker. Osteoporosis. You may be screened starting at age 64 if you are at high risk. Talk with your health care provider about your test results, treatment options, and if necessary, the need for more tests. Vaccines  Your health care provider may recommend certain vaccines, such as: Influenza vaccine. This is recommended every year. Tetanus, diphtheria, and acellular pertussis (Tdap, Td) vaccine. You may need a Td booster every 10 years. Zoster vaccine. You may need this after age 22. Pneumococcal 13-valent conjugate (PCV13) vaccine. One dose is recommended after age 86. Pneumococcal polysaccharide (PPSV23) vaccine. One dose is recommended after age 26. Talk to your health care provider about which screenings and vaccines you need and how often you need them. This information is not intended to replace advice given to you by your health care provider. Make sure you discuss any questions you have with your health care provider. Document Released: 12/29/2015 Document Revised: 08/21/2016 Document Reviewed: 10/03/2015 Elsevier Interactive Patient Education  2017 [employer].  Fall Prevention in the Home Falls can cause injuries. They can happen to people of all ages. There are many things you can do to make  your home safe and to help prevent falls. What can I do on the outside of my home? Regularly fix the edges of walkways and driveways and fix any cracks. Remove anything that might make you trip as you walk through a door, such as a raised step or threshold. Trim any bushes or trees on the path to your home. Use bright outdoor lighting. Clear any walking paths of anything that might make someone trip, such as rocks or tools. Regularly check to see if handrails are loose or broken. Make sure that both sides of any steps have handrails. Any raised decks and porches should have guardrails on the edges. Have any leaves, snow, or ice cleared regularly. Use sand or salt on walking paths during winter. Clean up any spills in your garage right away. This includes oil or grease spills. What can I do in the bathroom? Use night lights. Install grab bars by the toilet and in the tub and shower. Do not use towel bars as grab bars. Use non-skid mats or decals in the tub or shower. If you need to sit down in the shower, use a plastic, non-slip stool. Keep the floor dry. Clean up any water that spills on the floor as soon as it happens. Remove soap buildup in the tub or shower regularly. Attach bath mats securely with double-sided non-slip rug tape. Do not have throw rugs and other things on the floor that can make you trip. What can I do in the bedroom? Use night lights. Make sure that you have a light by your bed that is easy to reach. Do not use any sheets or blankets that are too big for your bed. They should not hang down onto the floor. Have a firm chair that has side arms. You can use this for support while you get dressed. Do not have throw rugs and other things on the floor that can make you trip. What can I do in the kitchen? Clean up any spills right away. Avoid walking on wet floors. Keep items that you use a lot in easy-to-reach places. If you need to reach something above  you, use a  strong step stool that has a grab bar. Keep electrical cords out of the way. Do not use floor polish or wax that makes floors slippery. If you must use wax, use non-skid floor wax. Do not have throw rugs and other things on the floor that can make you trip. What can I do with my stairs? Do not leave any items on the stairs. Make sure that there are handrails on both sides of the stairs and use them. Fix handrails that are broken or loose. Make sure that handrails are as long as the stairways. Check any carpeting to make sure that it is firmly attached to the stairs. Fix any carpet that is loose or worn. Avoid having throw rugs at the top or bottom of the stairs. If you do have throw rugs, attach them to the floor with carpet tape. Make sure that you have a light switch at the top of the stairs and the bottom of the stairs. If you do not have them, ask someone to add them for you. What else can I do to help prevent falls? Wear shoes that: Do not have high heels. Have rubber bottoms. Are comfortable and fit you well. Are closed at the toe. Do not wear sandals. If you use a stepladder: Make sure that it is fully opened. Do not climb a closed stepladder. Make sure that both sides of the stepladder are locked into place. Ask someone to hold it for you, if possible. Clearly mark and make sure that you can see: Any grab bars or handrails. First and last steps. Where the edge of each step is. Use tools that help you move around (mobility aids) if they are needed. These include: Canes. Walkers. Scooters. Crutches. Turn on the lights when you go into a dark area. Replace any light bulbs as soon as they burn out. Set up your furniture so you have a clear path. Avoid moving your furniture around. If any of your floors are uneven, fix them. If there are any pets around you, be aware of where they are. Review your medicines with your doctor. Some medicines can make you feel dizzy. This can  increase your chance of falling. Ask your doctor what other things that you can do to help prevent falls. This information is not intended to replace advice given to you by your health care provider. Make sure you discuss any questions you have with your health care provider. Document Released: 09/28/2009 Document Revised: 05/09/2016 Document Reviewed: 01/06/2015 Elsevier Interactive Patient Education  2017 [employer].

## 2023-04-17 DIAGNOSIS — H40003 Preglaucoma, unspecified, bilateral: Secondary | ICD-10-CM | POA: Diagnosis not present

## 2023-04-21 ENCOUNTER — Ambulatory Visit (HOSPITAL_COMMUNITY): Payer: Medicare HMO | Attending: Family Medicine

## 2023-04-21 ENCOUNTER — Ambulatory Visit (INDEPENDENT_AMBULATORY_CARE_PROVIDER_SITE_OTHER): Payer: Medicare HMO | Admitting: Internal Medicine

## 2023-04-21 DIAGNOSIS — I060 Rheumatic aortic stenosis: Secondary | ICD-10-CM

## 2023-04-21 NOTE — Progress Notes (Signed)
Patient ID: Travis Salazar. MRN: [id number] DOB/AGE: Jun 14, 1936 87 y.o.  Primary Care Physician:Simmons-Robinson, Tawanna Cooler, MD Primary Cardiologist: Donato Schultz, MD  PATIENT DID NOT APPEAR FOR APPOINTMENT   FOCUSED CARDIOVASCULAR PROBLEM LIST:   1.  Paradoxical low-flow low gradient aortic stenosis with an aortic valve area of 0.87 cm grade, mean gradient 25 mmHg, and peak velocity of 3.2 m/s with a stroke-volume index of 33 cc/m: EKG demonstrates sinus rhythm without conduction deficits 2.  Hypertension 3.  Hyperlipidemia 4.  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  Right upper lobe pulmonary nodule/mass thought to be inflammatory 6.  Prior PE 2022 with DVT October 2023 currently on Eliquis 7.  Frailty and ambulates with cane   HISTORY OF PRESENT ILLNESS: January 2024: The patient is a 87 y.o. male with the indicated medical history here for recommendations regarding his aortic valvular disease.  He was seen by Dr. Anne Fu recently.  He was doing well at that appointment.  An echocardiogram was done which demonstrated progressive aortic valvular disease.  The patient is here with her daughter.  He is doing fairly well.  He denies any significant shortness of breath, chest pain, presyncope, syncope, or paroxysmal nocturnal dyspnea.  He does not do much in a regular day.  He will sometimes tinker with things in his workshop however his vision has gotten worse likely from macular degeneration so he is no longer able to do this.  He ambulates with a cane.  He does not do much physically in a day.  He fortunately has not required any emergency room visits or hospitalizations.  He has had no significant bleeding or bruising episodes while on Eliquis.  He denies any easy fatigability.  He lives in a one-story home and does not tire out with any of his activities of daily living.  He has not seen a dentist in many many years.  Plan:  Continue to monitor due to lack of symptoms; 3  month follow up with TTE.  Today:  Past Medical History:  Diagnosis Date   Hyperlipidemia    Hypertension    Seizures (HCC)    Stroke [facility])     Past Surgical History:  Procedure Laterality Date   LIPOMA EXCISION     neck   PROSTATECTOMY     due to prostate cancer    Family History  Problem Relation Age of Onset   COPD Mother    Heart attack Father     Social History   Socioeconomic History   Marital status: Married    Spouse name: [name]   Number of children: 4   Years of education: 10th grade   Highest education level: 9th grade  Occupational History   Occupation: retired  Tobacco Use   Smoking status: Former    Packs/day: 1.00    Years: 50.00    Additional pack years: 0.00    Total pack years: 50.00    Types: Cigarettes    Quit date: 12/15/2009    Years since quitting: 13.3   Smokeless tobacco: Never  Vaping Use   Vaping Use: Never used  Substance and Sexual Activity   Alcohol use: No    Alcohol/week: 0.0 standard drinks of alcohol   Drug use: No   Sexual activity: Not on file  Other Topics Concern   Not on file  Social History Narrative   ** Merged History Encounter ** Right handed    Caffeine 2 cups daily   Home is one  story   Social Determinants of Health   Financial Resource Strain: Low Risk  (04/15/2023)   Overall Financial Resource Strain (CARDIA)    Difficulty of Paying Living Expenses: Not hard at all  Food Insecurity: No Food Insecurity (04/15/2023)   Hunger Vital Sign    Worried About Running Out of Food in the Last Year: Never true    Ran Out of Food in the Last Year: Never true  Transportation Needs: No Transportation Needs (04/15/2023)   PRAPARE - Administrator, Civil Service (Medical): No    Lack of Transportation (Non-Medical): No  Physical Activity: Sufficiently Active (04/15/2023)   Exercise Vital Sign    Days of Exercise per Week: 5 days    Minutes of Exercise per Session: 30 min  Stress: No Stress Concern Present  (04/15/2023)   [employer] of Occupational Health - Occupational Stress Questionnaire    Feeling of Stress : Not at all  Social Connections: Moderately Isolated (04/15/2023)   Social Connection and Isolation Panel [NHANES]    Frequency of Communication with Friends and Family: More than three times a week    Frequency of Social Gatherings with Friends and Family: Once a week    Attends Religious Services: More than 4 times per year    Active Member of [employer] or Organizations: No    Attends Banker Meetings: Never    Marital Status: Widowed  Intimate Partner Violence: Not At Risk (04/15/2023)   Humiliation, Afraid, Rape, and Kick questionnaire    Fear of Current or Ex-Partner: No    Emotionally Abused: No    Physically Abused: No    Sexually Abused: No     Prior to Admission medications   Medication Sig Start Date End Date Taking? Authorizing Provider  allopurinol (ZYLOPRIM) 300 MG tablet Take 300 mg by mouth daily as needed.    [provider]  amLODipine (NORVASC) 10 MG tablet TAKE 1 TABLET BY MOUTH DAILY 08/01/22   Maple Hudson., MD  atorvastatin (LIPITOR) 40 MG tablet Take 1 tablet (40 mg total) by mouth daily at 6 PM. 11/22/22   Ostwalt, Edmon Crape, PA-C  bicalutamide (CASODEX) 50 MG tablet Take 50 mg by mouth daily. 02/07/22   [provider]  diazePAM, 15 MG Dose, (VALTOCO 15 MG DOSE) 2 x 7.5 MG/0.1ML LQPK Spray in each nostril as needed for seizure. May use second dose after 4 hours if needed. 04/02/22   Van Clines, MD  ELIQUIS 5 MG TABS tablet TAKE 1 TABLET BY MOUTH TWICE  DAILY 06/05/22   Maple Hudson., MD  lacosamide (VIMPAT) 50 MG TABS tablet Take 1 tablet (50 mg total) by mouth 2 (two) times daily. 07/22/22   Van Clines, MD  levETIRAcetam (KEPPRA) 1000 MG tablet TAKE 2 TABLETS BY MOUTH TWICE  DAILY 08/29/22   Van Clines, MD  metoprolol tartrate (LOPRESSOR) 25 MG tablet TAKE ONE-HALF TABLET BY MOUTH  TWICE DAILY 10/01/22    Malva Limes, MD  mirtazapine (REMERON SOL-TAB) 30 MG disintegrating tablet Take 1 tablet (30 mg total) by mouth at bedtime. 08/01/22   Maple Hudson., MD  tamsulosin (FLOMAX) 0.4 MG CAPS capsule Take 0.4 mg by mouth daily. 09/21/21   [provider]    No Known Allergies  REVIEW OF SYSTEMS:  General: no fevers/chills/night sweats Eyes: no blurry vision, diplopia, or amaurosis ENT: no sore throat or hearing loss Resp: no cough, wheezing, or hemoptysis CV: no  edema or palpitations GI: no abdominal pain, nausea, vomiting, diarrhea, or constipation GU: no dysuria, frequency, or hematuria Skin: no rash Neuro: no headache, numbness, tingling, or weakness of extremities Musculoskeletal: no joint pain or swelling Heme: no bleeding, DVT, or easy bruising Endo: no polydipsia or polyuria  There were no vitals taken for this visit.       DATA AND STUDIES:  EKG: Sinus rhythm with first-degree AV block and no bundle-branch blocks.  2D ECHO: May 2024     January 2024  1. Left ventricular ejection fraction, by estimation, is 60 to 65%. The  left ventricle has normal function. The left ventricle has no regional  wall motion abnormalities. Left ventricular diastolic parameters are  consistent with Grade I diastolic  dysfunction (impaired relaxation).   2. Right ventricular systolic function is normal. The right ventricular  size is normal. There is normal pulmonary artery systolic pressure. The  estimated right ventricular systolic pressure is 21.1 mmHg.   3. The mitral valve is normal in structure. Trivial mitral valve  regurgitation. No evidence of mitral stenosis.   4. The aortic valve is calcified. Aortic valve regurgitation is mild.  Severe aortic valve stenosis. Aortic valve area, by VTI measures 0.87 cm.  Aortic valve mean gradient measures 25.0 mmHg. Aortic valve Vmax measures  3.22 m/s. DVI 0.22 and SVI 33.  Although the mean SVG and peak velocity are  more consistent with moderate  AS, the SVI and DIV are low. Findings consistent with paradoxical low flow  low gradient aortic stenosis.   5. The inferior vena cava is normal in size with greater than 50%  respiratory variability, suggesting right atrial pressure of 3 mmHg.   6. Compared to echo dated 05/28/2021, the mean AVG has increased from 24  to , DVI has decreased from 0.36 to 0.22, SVI has decreased from 38  to 33, VMax has increased from 2.9 m/s to 3.57m/s and AVA has decreased  from 1cm2 to 0.87cm2 (VTI).   CARDIAC CATH: n/a  STS RISK CALCULATOR: pending  NHYA CLASS: 1    ASSESSMENT AND PLAN:     Orbie Pyo, MD  04/21/2023 9:55 AM    [facility] [address] [place], [place], [place]  [address] Phone: [phone]; Fax: [phone]

## 2023-05-06 DIAGNOSIS — H401133 Primary open-angle glaucoma, bilateral, severe stage: Secondary | ICD-10-CM | POA: Diagnosis not present

## 2023-05-14 DIAGNOSIS — H401133 Primary open-angle glaucoma, bilateral, severe stage: Secondary | ICD-10-CM | POA: Diagnosis not present

## 2023-06-06 DIAGNOSIS — H401133 Primary open-angle glaucoma, bilateral, severe stage: Secondary | ICD-10-CM | POA: Diagnosis not present

## 2023-06-06 NOTE — Progress Notes (Unsigned)
Patient ID: Travis Salazar. MRN: [id number] DOB/AGE: November 02, 1936 87 y.o.  Primary Care Physician:Simmons-Robinson, Tawanna Cooler, MD Primary Cardiologist: Donato Schultz, MD  FOCUSED CARDIOVASCULAR PROBLEM LIST:   1.  Paradoxical low-flow low gradient aortic stenosis with an aortic valve area of 0.87 cm grade, mean gradient 25 mmHg, and peak velocity of 3.2 m/s with a stroke-volume index of 33 cc/m: EKG demonstrates sinus rhythm without conduction deficits 2.  Hypertension 3.  Hyperlipidemia 4.  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  Right upper lobe pulmonary nodule/mass thought to be inflammatory 6.  Prior PE 2022 with DVT October 2023 currently on Eliquis 7.  Frailty and ambulates with cane   HISTORY OF PRESENT ILLNESS:  January 2024: The patient is a 87 y.o. male with the indicated medical history here for recommendations regarding his aortic valvular disease.  He was seen by Dr. Anne Fu recently.  He was doing well at that appointment.  An echocardiogram was done which demonstrated progressive aortic valvular disease.  The patient is here with her daughter.  He is doing fairly well.  He denies any significant shortness of breath, chest pain, presyncope, syncope, or paroxysmal nocturnal dyspnea.  He does not do much in a regular day.  He will sometimes tinker with things in his workshop however his vision has gotten worse likely from macular degeneration so he is no longer able to do this.  He ambulates with a cane.  He does not do much physically in a day.  He fortunately has not required any emergency room visits or hospitalizations.  He has had no significant bleeding or bruising episodes while on Eliquis.  He denies any easy fatigability.  He lives in a one-story home and does not tire out with any of his activities of daily living.  He has not seen a dentist in many many years.  Plan: Per patient preference monitor for symptoms and follow-up in 3 months.  Today: In the  interim the patient was seen in the emergency department at the end of April for dizziness.  Head CT was negative.  This was thought to be due to his seizure disorder.  He was discharged the next day.  The patient had an echocardiogram done today that has not yet been read.  This demonstrates paradoxical low-flow low gradient aortic stenosis with a preserved ejection fraction and stroke-volume index of around 31 mL/m.  His mean gradient is 25 mmHg with a peak velocity of 3.15 m/s with a calculated aortic valve area 0.92 cm.  Compared to when I saw him last time in January his weight is stable.    He is here today with his caregiver.  He continues to do well.  He is able to transfer and walk around his house without any issues.  He on his own admission is pretty sedentary.  He however does not develop any dyspnea, presyncope, syncope, or chest discomfort with any of his activities of daily living.  He fortunately has not required any hospitalizations or emergency room visits for heart failure or other cardiac issues.    He does not see a dentist on a regular basis and has not seen one in a long time.  He still has some of his teeth.  Past Medical History:  Diagnosis Date   Hyperlipidemia    Hypertension    Seizures (HCC)    Stroke [facility])     Past Surgical History:  Procedure Laterality Date   LIPOMA EXCISION  neck   PROSTATECTOMY     due to prostate cancer    Family History  Problem Relation Age of Onset   COPD Mother    Heart attack Father     Social History   Socioeconomic History   Marital status: Married    Spouse name: [name]   Number of children: 4   Years of education: 10th grade   Highest education level: 9th grade  Occupational History   Occupation: retired  Tobacco Use   Smoking status: Former    Packs/day: 1.00    Years: 50.00    Additional pack years: 0.00    Total pack years: 50.00    Types: Cigarettes    Quit date: 12/15/2009    Years since quitting: 13.4    Smokeless tobacco: Never  Vaping Use   Vaping Use: Never used  Substance and Sexual Activity   Alcohol use: No    Alcohol/week: 0.0 standard drinks of alcohol   Drug use: No   Sexual activity: Not on file  Other Topics Concern   Not on file  Social History Narrative   ** Merged History Encounter ** Right handed    Caffeine 2 cups daily   Home is one story   Social Determinants of Health   Financial Resource Strain: Low Risk  (04/15/2023)   Overall Financial Resource Strain (CARDIA)    Difficulty of Paying Living Expenses: Not hard at all  Food Insecurity: No Food Insecurity (04/15/2023)   Hunger Vital Sign    Worried About Running Out of Food in the Last Year: Never true    Ran Out of Food in the Last Year: Never true  Transportation Needs: No Transportation Needs (04/15/2023)   PRAPARE - Administrator, Civil Service (Medical): No    Lack of Transportation (Non-Medical): No  Physical Activity: Sufficiently Active (04/15/2023)   Exercise Vital Sign    Days of Exercise per Week: 5 days    Minutes of Exercise per Session: 30 min  Stress: No Stress Concern Present (04/15/2023)   [employer] of Occupational Health - Occupational Stress Questionnaire    Feeling of Stress : Not at all  Social Connections: Moderately Isolated (04/15/2023)   Social Connection and Isolation Panel [NHANES]    Frequency of Communication with Friends and Family: More than three times a week    Frequency of Social Gatherings with Friends and Family: Once a week    Attends Religious Services: More than 4 times per year    Active Member of [employer] or Organizations: No    Attends Banker Meetings: Never    Marital Status: Widowed  Intimate Partner Violence: Not At Risk (04/15/2023)   Humiliation, Afraid, Rape, and Kick questionnaire    Fear of Current or Ex-Partner: No    Emotionally Abused: No    Physically Abused: No    Sexually Abused: No     Prior to Admission  medications   Medication Sig Start Date End Date Taking? Authorizing Provider  allopurinol (ZYLOPRIM) 300 MG tablet Take 300 mg by mouth daily as needed.    [provider]  amLODipine (NORVASC) 10 MG tablet TAKE 1 TABLET BY MOUTH DAILY 08/01/22   Maple Hudson., MD  atorvastatin (LIPITOR) 40 MG tablet Take 1 tablet (40 mg total) by mouth daily at 6 PM. 11/22/22   Ostwalt, Edmon Crape, PA-C  bicalutamide (CASODEX) 50 MG tablet Take 50 mg by mouth daily. 02/07/22   [provider]  diazePAM, 15 MG Dose, (VALTOCO 15 MG DOSE) 2 x 7.5 MG/0.1ML LQPK Spray in each nostril as needed for seizure. May use second dose after 4 hours if needed. 04/02/22   Van Clines, MD  ELIQUIS 5 MG TABS tablet TAKE 1 TABLET BY MOUTH TWICE  DAILY 06/05/22   Maple Hudson., MD  lacosamide (VIMPAT) 50 MG TABS tablet Take 1 tablet (50 mg total) by mouth 2 (two) times daily. 07/22/22   Van Clines, MD  levETIRAcetam (KEPPRA) 1000 MG tablet TAKE 2 TABLETS BY MOUTH TWICE  DAILY 08/29/22   Van Clines, MD  metoprolol tartrate (LOPRESSOR) 25 MG tablet TAKE ONE-HALF TABLET BY MOUTH  TWICE DAILY 10/01/22   Malva Limes, MD  mirtazapine (REMERON SOL-TAB) 30 MG disintegrating tablet Take 1 tablet (30 mg total) by mouth at bedtime. 08/01/22   Maple Hudson., MD  tamsulosin (FLOMAX) 0.4 MG CAPS capsule Take 0.4 mg by mouth daily. 09/21/21   [provider]    No Known Allergies  REVIEW OF SYSTEMS:  General: no fevers/chills/night sweats Eyes: no blurry vision, diplopia, or amaurosis ENT: no sore throat or hearing loss Resp: no cough, wheezing, or hemoptysis CV: no edema or palpitations GI: no abdominal pain, nausea, vomiting, diarrhea, or constipation GU: no dysuria, frequency, or hematuria Skin: no rash Neuro: no headache, numbness, tingling, or weakness of extremities Musculoskeletal: no joint pain or swelling Heme: no bleeding, DVT, or easy bruising Endo: no polydipsia or  polyuria  BP 126/72   Pulse 69   Ht 5\' 8"  (1.727 m)   Wt 210 lb 6.4 oz (95.4 kg)   SpO2 98%   BMI 31.99 kg/m   PHYSICAL EXAM: GEN:  AO x 3 in no acute distress HEENT: normal Dentition: Poor Neck: JVP normal. +2 carotid upstrokes without bruits. No thyromegaly. Lungs: equal expansion, clear bilaterally CV: Apex is discrete and nondisplaced, RRR with 2/6 SEM Abd: soft, non-tender, non-distended; no bruit; positive bowel sounds Ext: no edema, ecchymoses, or cyanosis Vascular: 2+ femoral pulses, 2+ radial pulses       Skin: warm and dry without rash Neuro: CN II-XII grossly intact; motor and sensory grossly intact    DATA AND STUDIES:  EKG: April 2024 Sinus rhythm with first-degree AV block and no bundle-branch blocks that I personally reviewed  2D ECHO: January 2024  1. Left ventricular ejection fraction, by estimation, is 60 to 65%. The  left ventricle has normal function. The left ventricle has no regional  wall motion abnormalities. Left ventricular diastolic parameters are  consistent with Grade I diastolic  dysfunction (impaired relaxation).   2. Right ventricular systolic function is normal. The right ventricular  size is normal. There is normal pulmonary artery systolic pressure. The  estimated right ventricular systolic pressure is 21.1 mmHg.   3. The mitral valve is normal in structure. Trivial mitral valve  regurgitation. No evidence of mitral stenosis.   4. The aortic valve is calcified. Aortic valve regurgitation is mild.  Severe aortic valve stenosis. Aortic valve area, by VTI measures 0.87 cm.  Aortic valve mean gradient measures 25.0 mmHg. Aortic valve Vmax measures  3.22 m/s. DVI 0.22 and SVI 33.  Although the mean SVG and peak velocity are more consistent with moderate  AS, the SVI and DIV are low. Findings consistent with paradoxical low flow  low gradient aortic stenosis.   5. The inferior vena cava is normal in size with greater than 50%  respiratory  variability,  suggesting right atrial pressure of 3 mmHg.   6. Compared to echo dated 05/28/2021, the mean AVG has increased from 24  to , DVI has decreased from 0.36 to 0.22, SVI has decreased from 38  to 33, VMax has increased from 2.9 m/s to 3.38m/s and AVA has decreased  from 1cm2 to 0.87cm2 (VTI).   CARDIAC CATH: n/a  STS RISK CALCULATOR: pending  NHYA CLASS: 1    ASSESSMENT AND PLAN:   Nonrheumatic aortic valve stenosis - Plan: ECHOCARDIOGRAM COMPLETE  Essential hypertension  Hyperlipidemia LDL goal <70  Deep vein thrombosis (DVT) of left lower extremity, unspecified chronicity, unspecified vein (HCC)  The patient has what I imagine to be a low functional capacity.  His paradoxical low-flow low gradient aortic stenosis has not yet caused him to be symptomatic.  We had a long conversation about this.  We will continue to monitor him for now.  I will see him back in 6 to 9 months depending on my availability with another echocardiogram.  I did tell him if he were to develop any presyncope, increasing fatigue, shortness of breath, exertional angina, or frank syncope that he should contact our office immediately.  I did give him our structural heart disease team's office number.  I did also tell him that if in the future we were to pursue an aortic valve intervention he will need a dental evaluation.   Orbie Pyo, MD  06/09/2023 2:33 PM    [facility] [address] [place], [place], [place]  [address] Phone: [phone]; Fax: [phone]

## 2023-06-09 ENCOUNTER — Encounter: Payer: Self-pay | Admitting: Internal Medicine

## 2023-06-09 ENCOUNTER — Ambulatory Visit (HOSPITAL_BASED_OUTPATIENT_CLINIC_OR_DEPARTMENT_OTHER): Payer: Medicare HMO

## 2023-06-09 ENCOUNTER — Ambulatory Visit: Payer: Medicare HMO | Attending: Internal Medicine | Admitting: Internal Medicine

## 2023-06-09 VITALS — BP 126/72 | HR 69 | Ht 68.0 in | Wt 210.4 lb

## 2023-06-09 DIAGNOSIS — I82402 Acute embolism and thrombosis of unspecified deep veins of left lower extremity: Secondary | ICD-10-CM | POA: Diagnosis not present

## 2023-06-09 DIAGNOSIS — I350 Nonrheumatic aortic (valve) stenosis: Secondary | ICD-10-CM

## 2023-06-09 DIAGNOSIS — I10 Essential (primary) hypertension: Secondary | ICD-10-CM | POA: Insufficient documentation

## 2023-06-09 DIAGNOSIS — E785 Hyperlipidemia, unspecified: Secondary | ICD-10-CM | POA: Insufficient documentation

## 2023-06-09 DIAGNOSIS — I060 Rheumatic aortic stenosis: Secondary | ICD-10-CM

## 2023-06-09 LAB — ECHOCARDIOGRAM COMPLETE
AR max vel: 1.09 cm2
AV Area VTI: 1.1 cm2
AV Area mean vel: 1 cm2
AV Mean grad: 20.8 mmHg
AV Peak grad: 34.9 mmHg
Ao pk vel: 2.95 m/s
Area-P 1/2: 2.66 cm2
S' Lateral: 2.7 cm

## 2023-06-09 MED ORDER — PERFLUTREN LIPID MICROSPHERE
1.0000 mL | INTRAVENOUS | Status: AC | PRN
Start: 2023-06-09 — End: 2023-06-09
  Administered 2023-06-09: 2 mL via INTRAVENOUS

## 2023-06-09 NOTE — Addendum Note (Signed)
Addended by: Alvin Critchley A on: 06/09/2023 03:03 PM   Modules accepted: Orders

## 2023-06-09 NOTE — Patient Instructions (Signed)
Medication Instructions:  Your physician recommends that you continue on your current medications as directed. Please refer to the Current Medication list given to you today.  *If you need a refill on your cardiac medications before your next appointment, please call your pharmacy*   Testing/Procedures: ECHO in 6- 8 months Your physician has requested that you have an echocardiogram. Echocardiography is a painless test that uses sound waves to create images of your heart. It provides your doctor with information about the size and shape of your heart and how well your heart's chambers and valves are working. This procedure takes approximately one hour. There are no restrictions for this procedure. Please do NOT wear cologne, perfume, aftershave, or lotions (deodorant is allowed). Please arrive 15 minutes prior to your appointment time.    Follow-Up: At [facility], you and your health needs are our priority.  As part of our continuing mission to provide you with exceptional heart care, we have created designated Provider Care Teams.  These Care Teams include your primary Cardiologist (physician) and Advanced Practice Providers (APPs -  Physician Assistants and Nurse Practitioners) who all work together to provide you with the care you need, when you need it.   Your next appointment:   6 -8 month(s) following ECHO  Provider:   Dr Lynnette Caffey

## 2023-06-20 DIAGNOSIS — C61 Malignant neoplasm of prostate: Secondary | ICD-10-CM | POA: Diagnosis not present

## 2023-06-26 DIAGNOSIS — R338 Other retention of urine: Secondary | ICD-10-CM | POA: Diagnosis not present

## 2023-06-26 DIAGNOSIS — C61 Malignant neoplasm of prostate: Secondary | ICD-10-CM | POA: Diagnosis not present

## 2023-06-26 DIAGNOSIS — C7951 Secondary malignant neoplasm of bone: Secondary | ICD-10-CM | POA: Diagnosis not present

## 2023-06-27 DIAGNOSIS — H401133 Primary open-angle glaucoma, bilateral, severe stage: Secondary | ICD-10-CM | POA: Diagnosis not present

## 2023-07-08 DIAGNOSIS — H25012 Cortical age-related cataract, left eye: Secondary | ICD-10-CM | POA: Diagnosis not present

## 2023-07-08 DIAGNOSIS — H2511 Age-related nuclear cataract, right eye: Secondary | ICD-10-CM | POA: Diagnosis not present

## 2023-07-08 DIAGNOSIS — H25011 Cortical age-related cataract, right eye: Secondary | ICD-10-CM | POA: Diagnosis not present

## 2023-07-08 DIAGNOSIS — H2512 Age-related nuclear cataract, left eye: Secondary | ICD-10-CM | POA: Diagnosis not present

## 2023-07-09 ENCOUNTER — Encounter: Payer: Self-pay | Admitting: Ophthalmology

## 2023-07-14 NOTE — Discharge Instructions (Signed)

## 2023-07-16 ENCOUNTER — Encounter
Admission: RE | Disposition: A | Discharge status: Home / Self Care | Payer: Self-pay | Source: Home / Self Care | Attending: Ophthalmology

## 2023-07-16 ENCOUNTER — Other Ambulatory Visit: Payer: Self-pay

## 2023-07-16 ENCOUNTER — Ambulatory Visit
Admission: RE | Admit: 2023-07-16 | Discharge: 2023-07-16 | Disposition: A | Discharge status: Home / Self Care | Payer: Medicare HMO | Source: Home / Self Care | Attending: Ophthalmology | Admitting: Ophthalmology

## 2023-07-16 ENCOUNTER — Encounter: Payer: Self-pay | Admitting: Ophthalmology

## 2023-07-16 ENCOUNTER — Ambulatory Visit: Payer: Medicare HMO | Admitting: Anesthesiology

## 2023-07-16 DIAGNOSIS — E785 Hyperlipidemia, unspecified: Secondary | ICD-10-CM | POA: Diagnosis not present

## 2023-07-16 DIAGNOSIS — I10 Essential (primary) hypertension: Secondary | ICD-10-CM | POA: Insufficient documentation

## 2023-07-16 DIAGNOSIS — Z86711 Personal history of pulmonary embolism: Secondary | ICD-10-CM | POA: Insufficient documentation

## 2023-07-16 DIAGNOSIS — I69398 Other sequelae of cerebral infarction: Secondary | ICD-10-CM | POA: Diagnosis not present

## 2023-07-16 DIAGNOSIS — R54 Age-related physical debility: Secondary | ICD-10-CM | POA: Insufficient documentation

## 2023-07-16 DIAGNOSIS — I2510 Atherosclerotic heart disease of native coronary artery without angina pectoris: Secondary | ICD-10-CM | POA: Diagnosis not present

## 2023-07-16 DIAGNOSIS — H2511 Age-related nuclear cataract, right eye: Secondary | ICD-10-CM | POA: Insufficient documentation

## 2023-07-16 DIAGNOSIS — H25012 Cortical age-related cataract, left eye: Secondary | ICD-10-CM | POA: Diagnosis not present

## 2023-07-16 DIAGNOSIS — Z87891 Personal history of nicotine dependence: Secondary | ICD-10-CM | POA: Diagnosis not present

## 2023-07-16 DIAGNOSIS — G40909 Epilepsy, unspecified, not intractable, without status epilepticus: Secondary | ICD-10-CM | POA: Diagnosis not present

## 2023-07-16 DIAGNOSIS — H401113 Primary open-angle glaucoma, right eye, severe stage: Secondary | ICD-10-CM | POA: Insufficient documentation

## 2023-07-16 DIAGNOSIS — J449 Chronic obstructive pulmonary disease, unspecified: Secondary | ICD-10-CM | POA: Diagnosis not present

## 2023-07-16 DIAGNOSIS — H2589 Other age-related cataract: Secondary | ICD-10-CM | POA: Diagnosis not present

## 2023-07-16 DIAGNOSIS — H2512 Age-related nuclear cataract, left eye: Secondary | ICD-10-CM | POA: Diagnosis not present

## 2023-07-16 DIAGNOSIS — H25011 Cortical age-related cataract, right eye: Secondary | ICD-10-CM | POA: Diagnosis not present

## 2023-07-16 HISTORY — DX: Malignant neoplasm of prostate: C61

## 2023-07-16 HISTORY — DX: Other nonspecific abnormal finding of lung field: R91.8

## 2023-07-16 HISTORY — DX: Secondary malignant neoplasm of bone: C79.51

## 2023-07-16 HISTORY — DX: Nonrheumatic aortic (valve) stenosis: I35.0

## 2023-07-16 HISTORY — DX: Personal history of malignant neoplasm of prostate: Z85.46

## 2023-07-16 HISTORY — DX: Primary open-angle glaucoma, bilateral, severe stage: H40.1133

## 2023-07-16 HISTORY — DX: Personal history of pulmonary embolism: Z86.711

## 2023-07-16 HISTORY — PX: CATARACT EXTRACTION W/PHACO: SHX586

## 2023-07-16 HISTORY — DX: Rheumatic mitral stenosis: I05.0

## 2023-07-16 SURGERY — PHACOEMULSIFICATION, CATARACT, WITH IOL INSERTION
Anesthesia: Monitor Anesthesia Care | Laterality: Right

## 2023-07-16 MED ORDER — SIGHTPATH DOSE#1 NA HYALUR & NA CHOND-NA HYALUR IO KIT
PACK | INTRAOCULAR | Status: DC | PRN
  Administered 2023-07-16: 1 via OPHTHALMIC

## 2023-07-16 MED ORDER — PHENYLEPHRINE-KETOROLAC 1-0.3 % IO SOLN
INTRAOCULAR | Status: DC | PRN
  Administered 2023-07-16: 98 mL via OPHTHALMIC

## 2023-07-16 MED ORDER — BRIMONIDINE TARTRATE-TIMOLOL 0.2-0.5 % OP SOLN
OPHTHALMIC | Status: DC | PRN
  Administered 2023-07-16: 1 [drp] via OPHTHALMIC

## 2023-07-16 MED ORDER — ARMC OPHTHALMIC DILATING DROPS
1.0000 | OPHTHALMIC | Status: DC | PRN
  Administered 2023-07-16 (×3): 1 via OPHTHALMIC

## 2023-07-16 MED ORDER — MIDAZOLAM HCL 2 MG/2ML IJ SOLN
INTRAMUSCULAR | Status: DC | PRN
  Administered 2023-07-16: 2 mg via INTRAVENOUS

## 2023-07-16 MED ORDER — TETRACAINE HCL 0.5 % OP SOLN
1.0000 [drp] | OPHTHALMIC | Status: DC | PRN
  Administered 2023-07-16 (×3): 1 [drp] via OPHTHALMIC

## 2023-07-16 MED ORDER — LIDOCAINE HCL (PF) 2 % IJ SOLN
INTRAOCULAR | Status: DC | PRN
  Administered 2023-07-16: 1 mL via INTRAOCULAR

## 2023-07-16 MED ORDER — TRYPAN BLUE 0.06 % IO SOSY
PREFILLED_SYRINGE | INTRAOCULAR | Status: DC | PRN
  Administered 2023-07-16: .5 mL via INTRAOCULAR

## 2023-07-16 MED ORDER — MOXIFLOXACIN HCL 0.5 % OP SOLN
OPHTHALMIC | Status: DC | PRN
  Administered 2023-07-16: .2 mL via OPHTHALMIC

## 2023-07-16 MED ORDER — LACTATED RINGERS IV SOLN: INTRAVENOUS | Status: DC

## 2023-07-16 MED ORDER — SIGHTPATH DOSE#1 BSS IO SOLN
INTRAOCULAR | Status: DC | PRN
  Administered 2023-07-16: 15 mL via INTRAOCULAR

## 2023-07-16 MED ORDER — SIGHTPATH DOSE#1 BSS IO SOLN: INTRAOCULAR | Status: DC | PRN

## 2023-07-16 MED ORDER — BSS IO SOLN
INTRAOCULAR | Status: DC | PRN
  Administered 2023-07-16: 15 mL via INTRAOCULAR

## 2023-07-16 SURGICAL SUPPLY — 21 items
BLADE DUAL KAHOOK SINGLE USE (BLADE) IMPLANT
BNDG EYE OVAL 2 1/8 X 2 5/8 (GAUZE/BANDAGES/DRESSINGS) IMPLANT
CANNULA ANT/CHMB 27G (MISCELLANEOUS) IMPLANT
CANNULA ANT/CHMB 27GA (MISCELLANEOUS) ×1
CATARACT SUITE SIGHTPATH (MISCELLANEOUS) ×1
DISSECTOR HYDRO NUCLEUS 50X22 (MISCELLANEOUS) ×1 IMPLANT
DRSG TEGADERM 2-3/8X2-3/4 SM (GAUZE/BANDAGES/DRESSINGS) ×1 IMPLANT
FEE CATARACT SUITE SIGHTPATH (MISCELLANEOUS) ×1 IMPLANT
GLOVE SURG SYN 7.5 E (GLOVE) ×1
GLOVE SURG SYN 7.5 PF PI (GLOVE) ×1 IMPLANT
GLOVE SURG SYN 8.5 E (GLOVE) ×1
GLOVE SURG SYN 8.5 PF PI (GLOVE) ×1 IMPLANT
LENS IOL TECNIS EYHANCE 18.0 (Intraocular Lens) IMPLANT
NDL FILTER BLUNT 18X1 1/2 (NEEDLE) IMPLANT
NEEDLE FILTER BLUNT 18X1 1/2 (NEEDLE)
PACK VIT ANT 23G (MISCELLANEOUS) IMPLANT
RING MALYGIN 7.0 (MISCELLANEOUS) IMPLANT
STRAP BODY AND KNEE 60X3 (MISCELLANEOUS) IMPLANT
SUT ETHILON 10-0 CS-B-6CS-B-6 (SUTURE)
SUTURE EHLN 10-0 CS-B-6CS-B-6 (SUTURE) IMPLANT
SYR 5ML LL (SYRINGE) IMPLANT

## 2023-07-16 NOTE — Op Note (Signed)
OPERATIVE NOTE  Travis Salazar [id number] 07/16/2023   PREOPERATIVE DIAGNOSIS: Nuclear sclerotic cataract right eye. 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 Item Serial No. Manufacturer Lot No. LRB No. Used Action  LENS IOL TECNIS EYHANCE 18.0 - [id number] Intraocular Lens LENS IOL TECNIS EYHANCE 18.0 [id number] [employer]  Right 1 Implanted      SURGEON:  Julious Payer. Rolley Sims, MD   ANESTHESIA:  Topical with tetracaine drops, augmented with 1% preservative-free intracameral lidocaine.   COMPLICATIONS:  None.   DESCRIPTION OF PROCEDURE:  The patient was identified in the holding room and transported to the operating room and placed in the supine position under the operating microscope.  The right eye was identified as the operative eye, which was prepped and draped in the usual sterile ophthalmic fashion.   A 1 millimeter clear-corneal paracentesis was made superotemporally. Preservative-free 1% lidocaine mixed with 1:1,000 bisulfite-free aqueous solution of epinephrine was injected into the anterior chamber. There was no red reflex secondary to diffuse cortical changes, so Trypan blue was injected intracamerally to stain the anterior capsule and facilitate safe creation of the capsulorrhexis. The anterior chamber was then filled with Viscoat viscoelastic. A 2.4 millimeter keratome was used to make a clear-corneal incision inferotemporally. A curvilinear capsulorrhexis was made with a  cystotome and capsulorrhexis forceps.   The microscope was adjusted and a gonioprism was used to visualize the trabecular meshwork.  The [facility] Dual Blade was advanced across the anterior chamber under viscoelastic.  The blade was used to mark the trabecular meshwork at the 1:30 position.  The blade was placed two clock hours clockwise into the meshwork.  Proper positioning was confirmed.  The blade was passed counterclockwise through the meshwork to excise approximately two to three clock-hours of trabecular meshwork. The microscope was adjusted back to its original position.  Balanced salt solution was used to hydrodissect and hydrodelineate the nucleus. Phacoemulsification was then used to remove the lens nucleus and epinucleus. The remaining cortex was then removed using the irrigation and aspiration handpiece. Provisc was then placed into the capsular bag to distend it for lens placement. A +18.00 D DIB00 intraocular lens was then injected into the capsular bag. The remaining viscoelastic was aspirated.   Wounds were hydrated with balanced salt solution.  The anterior chamber was inflated to a physiologic pressure with balanced salt solution.  No wound leaks were noted. Vigamox was injected intracamerally.  Timolol and Brimonidine drops were applied to the eye.  The patient was taken to the recovery room in stable condition without complications of anesthesia or surgery.  Rolly Pancake [place] 07/16/2023, 11:48 AM

## 2023-07-16 NOTE — Anesthesia Preprocedure Evaluation (Addendum)
Anesthesia Evaluation  Patient identified by MRN, date of birth, ID band Patient awake    Reviewed: Allergy & Precautions, H&P , NPO status , Patient's Chart, lab work & pertinent test results  Airway Mallampati: III  TM Distance: >3 FB Neck ROM: Full    Dental  (+) Poor Dentition, Chipped   Pulmonary pneumonia, COPD, former smoker Mass right upper lobe of lung in 2023, was felt to be primary neoplasm, patient did not wish biopsy at that time   Pulmonary exam normal breath sounds clear to auscultation       Cardiovascular hypertension, + CAD  Normal cardiovascular exam Rhythm:Regular Rate:Normal  06-09-23   1. Left ventricular ejection fraction, by estimation, is 50 to 55%. The  left ventricle has low normal function. The left ventricle demonstrates  regional wall motion abnormalities. Focal hypokinesis in mid  anterior/lateral wall Left ventricular diastolic   parameters are indeterminate.   2. Right ventricular systolic function is normal. The right ventricular  size is normal. Tricuspid regurgitation signal is inadequate for assessing  PA pressure.   3. The mitral valve is normal in structure. Trivial mitral valve  regurgitation. Mild mitral stenosis. The mean mitral valve gradient is 4.0  mmHg with average heart rate of 66 bpm.   4. The aortic valve is calcified. There is severe calcifcation of the  aortic valve. Aortic valve regurgitation is trivial. Moderate to severe  aortic valve stenosis. Vmax 3.2 m/s, MG 25 mmHg, AVA 0.9cm^2, DI 0.30     Neuro/Psych Seizures -,  Embolic Stroke  right MCA 2020 with residual seizure disorder  03-24-22 Seizure type:  Partial simple Initial focality:  Left-sided Episode characteristics: focal shaking   Return to baseline: yes   Duration:  30 minutes  Frail, walks with cane CVA  negative psych ROS   GI/Hepatic negative GI ROS, Neg liver ROS,,,  Endo/Other  negative endocrine ROS     Renal/GU negative Renal ROS  negative genitourinary   Musculoskeletal negative musculoskeletal ROS (+)    Abdominal   Peds negative pediatric ROS (+)  Hematology negative hematology ROS (+)   Anesthesia Other Findings Hyperlipidemia  Stroke 2020 w.residual seizure disorder Hypertension  Seizures  History of pulmonary embolus  Mild mitral stenosis by prior echocardiogram Primary open-angle glaucoma, bilateral, severe stage  Moderate to severe aortic stenosis    Reproductive/Obstetrics negative OB ROS                             Anesthesia Physical Anesthesia Plan  ASA: 4  Anesthesia Plan: MAC   Post-op Pain Management:    Induction: Intravenous  PONV Risk Score and Plan:   Airway Management Planned: Natural Airway and Nasal Cannula  Additional Equipment:   Intra-op Plan:   Post-operative Plan:   Informed Consent: I have reviewed the patients History and Physical, chart, labs and discussed the procedure including the risks, benefits and alternatives for the proposed anesthesia with the patient or authorized representative who has indicated his/her understanding and acceptance.     Dental Advisory Given  Plan Discussed with: Anesthesiologist, CRNA and Surgeon  Anesthesia Plan Comments: (Patient consented for risks of anesthesia including but not limited to:  - adverse reactions to medications - damage to eyes, teeth, lips or other oral mucosa - nerve damage due to positioning  - sore throat or hoarseness - Damage to heart, brain, nerves, lungs, other parts of body or loss of life  Patient voiced  understanding.)        Anesthesia Quick Evaluation

## 2023-07-16 NOTE — H&P (Signed)
[facility]   Primary Care Physician:  Ronnald Ramp, MD Ophthalmologist: Dr. Deberah Pelton  Pre-Procedure History & Physical: HPI:  Travis Pilgram. is a 87 y.o. male here for cataract surgery.   Past Medical History:  Diagnosis Date   Hyperlipidemia    Hypertension    Seizures (HCC)    Stroke [facility])     Past Surgical History:  Procedure Laterality Date   LIPOMA EXCISION     neck   PROSTATECTOMY     due to prostate cancer    Prior to Admission medications   Medication Sig Start Date End Date Taking? Authorizing Provider  acetaminophen (TYLENOL) 500 MG tablet Take 500 mg by mouth daily as needed (back pain).   Yes [provider]  amLODipine (NORVASC) 10 MG tablet Take 1 tablet (10 mg total) by mouth daily. 02/05/23  Yes Simmons-Robinson, Tawanna Cooler, MD  apixaban (ELIQUIS) 5 MG TABS tablet Take 1 tablet (5 mg total) by mouth 2 (two) times daily. 02/05/23  Yes Simmons-Robinson, Tawanna Cooler, MD  atorvastatin (LIPITOR) 40 MG tablet Take 1 tablet (40 mg total) by mouth daily at 6 PM. 02/05/23  Yes Simmons-Robinson, Makiera, MD  brimonidine (ALPHAGAN) 0.2 % ophthalmic solution Place 1 drop into both eyes 2 (two) times daily.   Yes [provider]  DIAZEPAM PO Take 1 tablet by mouth as needed (seizures).   Yes [provider]  diazePAM, 15 MG Dose, (VALTOCO 15 MG DOSE) 2 x 7.5 MG/0.1ML LQPK Spray in each nostril as needed for seizure. May use second dose after 4 hours if needed. Patient taking differently: Place 15 mg into the nose See admin instructions. 15 mg in nostril as needed for seizures. May use second dose after 4 hours if needed. 04/02/22  Yes Van Clines, MD  dorzolamide (TRUSOPT) 2 % ophthalmic solution Place 1 drop into both eyes 2 (two) times daily.   Yes [provider]  lacosamide (VIMPAT) 50 MG TABS tablet Take 1 tablet (50 mg total) by mouth 2 (two) times daily. 02/18/23  Yes Van Clines, MD  latanoprost (XALATAN) 0.005  % ophthalmic solution Place 1 drop into both eyes at bedtime.   Yes [provider]  levETIRAcetam (KEPPRA) 1000 MG tablet Take 2 tablets (2,000 mg total) by mouth 2 (two) times daily. 02/18/23  Yes Van Clines, MD  metoprolol tartrate (LOPRESSOR) 25 MG tablet Take 0.5 tablets (12.5 mg total) by mouth 2 (two) times daily. 02/05/23  Yes Simmons-Robinson, Makiera, MD  mirtazapine (REMERON SOL-TAB) 15 MG disintegrating tablet Take 1 tablet (15 mg total) by mouth at bedtime. Patient not taking: Reported on 07/09/2023 02/07/23   Simmons-Robinson, Tawanna Cooler, MD  potassium chloride SA (KLOR-CON M) 20 MEQ tablet Take 2 tablets (40 mEq total) by mouth daily for 2 days. Patient not taking: Reported on 06/09/2023 04/11/23 04/13/23  Dorcas Carrow, MD  timolol (BETIMOL) 0.5 % ophthalmic solution Place 1 drop into both eyes 2 (two) times daily. Patient not taking: Reported on 07/09/2023    [provider]    Allergies as of 07/01/2023   (No Known Allergies)    Family History  Problem Relation Age of Onset   COPD Mother    Heart attack Father     Social History   Socioeconomic History   Marital status: Married    Spouse name: [name]   Number of children: 4   Years of education: 10th grade   Highest education level: 9th grade  Occupational History  Occupation: retired  Tobacco Use   Smoking status: Former    Current packs/day: 0.00    Average packs/day: 1 pack/day for 50.0 years (50.0 ttl pk-yrs)    Types: Cigarettes    Start date: 12/16/1959    Quit date: 12/15/2009    Years since quitting: 13.5   Smokeless tobacco: Never  Vaping Use   Vaping status: Never Used  Substance and Sexual Activity   Alcohol use: No    Alcohol/week: 0.0 standard drinks of alcohol   Drug use: No   Sexual activity: Not on file  Other Topics Concern   Not on file  Social History Narrative   ** Merged History Encounter ** Right handed    Caffeine 2 cups daily   Home is one story   Social  Determinants of Health   Financial Resource Strain: Low Risk  (04/15/2023)   Overall Financial Resource Strain (CARDIA)    Difficulty of Paying Living Expenses: Not hard at all  Food Insecurity: No Food Insecurity (04/15/2023)   Hunger Vital Sign    Worried About Running Out of Food in the Last Year: Never true    Ran Out of Food in the Last Year: Never true  Transportation Needs: No Transportation Needs (04/15/2023)   PRAPARE - Administrator, Civil Service (Medical): No    Lack of Transportation (Non-Medical): No  Physical Activity: Sufficiently Active (04/15/2023)   Exercise Vital Sign    Days of Exercise per Week: 5 days    Minutes of Exercise per Session: 30 min  Stress: No Stress Concern Present (04/15/2023)   [employer] of Occupational Health - Occupational Stress Questionnaire    Feeling of Stress : Not at all  Social Connections: Moderately Isolated (04/15/2023)   Social Connection and Isolation Panel [NHANES]    Frequency of Communication with Friends and Family: More than three times a week    Frequency of Social Gatherings with Friends and Family: Once a week    Attends Religious Services: More than 4 times per year    Active Member of [employer] or Organizations: No    Attends Banker Meetings: Never    Marital Status: Widowed  Intimate Partner Violence: Not At Risk (04/15/2023)   Humiliation, Afraid, Rape, and Kick questionnaire    Fear of Current or Ex-Partner: No    Emotionally Abused: No    Physically Abused: No    Sexually Abused: No    Review of Systems: See HPI, otherwise negative ROS  Physical Exam: There were no vitals taken for this visit. General:   Alert, cooperative in NAD Head:  Normocephalic and atraumatic. Respiratory:  Normal work of breathing. Cardiovascular:  RRR  Impression/Plan: Travis Brookes. is here for cataract surgery.  Risks, benefits, limitations, and alternatives regarding cataract surgery have been reviewed  with the patient.  Questions have been answered.  All parties agreeable.   Estanislado Pandy, MD  07/16/2023, 7:09 AM

## 2023-07-16 NOTE — Transfer of Care (Signed)
Immediate Anesthesia Transfer of Care Note  Patient: Travis Salazar.  Procedure(s) Performed: CATARACT EXTRACTION PHACO AND INTRAOCULAR LENS PLACEMENT (IOC) RIGHT MALYUGIN, OMIDRIA, VISION BLUE, [place] DUAL BLADE GONIOTOMY 8.19 1.03.2 (Right)  Patient Location: PACU  Anesthesia Type: MAC  Level of Consciousness: awake, alert  and patient cooperative  Airway and Oxygen Therapy: Patient Spontanous Breathing and Patient connected to supplemental oxygen  Post-op Assessment: Post-op Vital signs reviewed, Patient's Cardiovascular Status Stable, Respiratory Function Stable, Patent Airway and No signs of Nausea or vomiting  Post-op Vital Signs: Reviewed and stable  Complications: No notable events documented.

## 2023-07-17 NOTE — Anesthesia Postprocedure Evaluation (Signed)
Anesthesia Post Note  Patient: Travis Salazar.  Procedure(s) Performed: CATARACT EXTRACTION PHACO AND INTRAOCULAR LENS PLACEMENT (IOC) RIGHT MALYUGIN, OMIDRIA, VISION BLUE, [place] DUAL BLADE GONIOTOMY 8.19 1.03.2 (Right)  Patient location during evaluation: PACU Anesthesia Type: MAC Level of consciousness: awake and alert Pain management: pain level controlled Vital Signs Assessment: post-procedure vital signs reviewed and stable Respiratory status: spontaneous breathing, nonlabored ventilation, respiratory function stable and patient connected to nasal cannula oxygen Cardiovascular status: stable and blood pressure returned to baseline Postop Assessment: no apparent nausea or vomiting Anesthetic complications: no   No notable events documented.   Last Vitals:  Vitals:   07/16/23 1153 07/16/23 1159  BP: 108/64 (!) 140/63  Pulse: (!) 58 60  Resp: 12 16  Temp: (!) 36.2 C (!) 36.2 C  SpO2: 100% 98%    Last Pain:  Vitals:   07/17/23 0938  TempSrc:   PainSc: 0-No pain                 Marisue Humble

## 2023-07-29 NOTE — Anesthesia Preprocedure Evaluation (Addendum)
Anesthesia Evaluation  Patient identified by MRN, date of birth, ID band Patient awake    Reviewed: Allergy & Precautions, H&P , NPO status , Patient's Chart, lab work & pertinent test results  Airway Mallampati: III  TM Distance: >3 FB Neck ROM: Full    Dental no notable dental hx. (+) Poor Dentition, Chipped   Pulmonary neg pulmonary ROS, pneumonia, COPD, former smoker Mass right upper lobe of lung in 2023, was felt to be primary neoplasm, patient did not wish biopsy at that time   Pulmonary exam normal breath sounds clear to auscultation       Cardiovascular hypertension, + CAD  negative cardio ROS Normal cardiovascular exam Rhythm:Regular Rate:Normal  06-09-23   1. Left ventricular ejection fraction, by estimation, is 50 to 55%. The  left ventricle has low normal function. The left ventricle demonstrates  regional wall motion abnormalities. Focal hypokinesis in mid  anterior/lateral wall Left ventricular diastolic   parameters are indeterminate.   2. Right ventricular systolic function is normal. The right ventricular  size is normal. Tricuspid regurgitation signal is inadequate for assessing  PA pressure.   3. The mitral valve is normal in structure. Trivial mitral valve  regurgitation. Mild mitral stenosis. The mean mitral valve gradient is 4.0  mmHg with average heart rate of 66 bpm.   4. The aortic valve is calcified. There is severe calcifcation of the  aortic valve. Aortic valve regurgitation is trivial. Moderate to severe  aortic valve stenosis. 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   03-24-22 Seizure type:  Partial simple Initial focality:  Left-sided Episode characteristics: focal shaking   Return to baseline: yes   Duration:  30 minutes   Frail, walks with cane  CVA negative neurological ROS   negative psych ROS   GI/Hepatic negative GI ROS, Neg liver ROS,,,  Endo/Other  negative endocrine ROS    Renal/GU negative Renal ROS  negative genitourinary   Musculoskeletal negative musculoskeletal ROS (+)    Abdominal   Peds negative pediatric ROS (+)  Hematology negative hematology ROS (+)   Anesthesia Other Findings Previous cataract surgery 07-16-23  Hyperlipidemia  Stroke (HCC) Hypertension  Seizures (HCC) History of pulmonary embolus (PE)  Mild mitral stenosis by prior echocardiogram Primary open-angle glaucoma, bilateral, severe stage  Moderate to severe aortic stenosis History of prostate cancer  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)  Mass of upper lobe of right lung     Reproductive/Obstetrics negative OB ROS                              Anesthesia Physical Anesthesia Plan  ASA: 4  Anesthesia Plan: MAC   Post-op Pain Management:    Induction: Intravenous  PONV Risk Score and Plan:   Airway Management Planned: Natural Airway and Nasal Cannula  Additional Equipment:   Intra-op Plan:   Post-operative Plan:   Informed Consent: I have reviewed the patients History and Physical, chart, labs and discussed the procedure including the risks, benefits and alternatives for the proposed anesthesia with the patient or authorized representative who has indicated his/her understanding and acceptance.     Dental Advisory Given  Plan Discussed with: Anesthesiologist, CRNA and Surgeon  Anesthesia Plan Comments: (Patient consented for risks of anesthesia including but not  limited to:  - adverse reactions to medications - damage to eyes, teeth, lips or other oral mucosa - nerve damage due to positioning  - sore throat or hoarseness - Damage to heart, brain, nerves, lungs, other parts of body or loss of life  Patient voiced understanding.)         Anesthesia Quick Evaluation

## 2023-07-29 NOTE — Discharge Instructions (Signed)

## 2023-07-31 ENCOUNTER — Encounter
Admission: RE | Disposition: A | Discharge status: Home / Self Care | Payer: Self-pay | Source: Home / Self Care | Attending: Ophthalmology

## 2023-07-31 ENCOUNTER — Other Ambulatory Visit: Payer: Self-pay

## 2023-07-31 ENCOUNTER — Ambulatory Visit
Admission: RE | Admit: 2023-07-31 | Discharge: 2023-07-31 | Disposition: A | Discharge status: Home / Self Care | Payer: Medicare HMO | Attending: Ophthalmology | Admitting: Ophthalmology

## 2023-07-31 ENCOUNTER — Ambulatory Visit: Payer: Medicare HMO | Admitting: Anesthesiology

## 2023-07-31 ENCOUNTER — Encounter: Payer: Self-pay | Admitting: Ophthalmology

## 2023-07-31 DIAGNOSIS — I080 Rheumatic disorders of both mitral and aortic valves: Secondary | ICD-10-CM | POA: Insufficient documentation

## 2023-07-31 DIAGNOSIS — R54 Age-related physical debility: Secondary | ICD-10-CM | POA: Insufficient documentation

## 2023-07-31 DIAGNOSIS — H2512 Age-related nuclear cataract, left eye: Secondary | ICD-10-CM | POA: Insufficient documentation

## 2023-07-31 DIAGNOSIS — Z9841 Cataract extraction status, right eye: Secondary | ICD-10-CM | POA: Insufficient documentation

## 2023-07-31 DIAGNOSIS — Z87891 Personal history of nicotine dependence: Secondary | ICD-10-CM | POA: Diagnosis not present

## 2023-07-31 DIAGNOSIS — E1139 Type 2 diabetes mellitus with other diabetic ophthalmic complication: Secondary | ICD-10-CM | POA: Diagnosis not present

## 2023-07-31 DIAGNOSIS — G40909 Epilepsy, unspecified, not intractable, without status epilepticus: Secondary | ICD-10-CM | POA: Insufficient documentation

## 2023-07-31 DIAGNOSIS — I69398 Other sequelae of cerebral infarction: Secondary | ICD-10-CM | POA: Diagnosis not present

## 2023-07-31 DIAGNOSIS — Z86711 Personal history of pulmonary embolism: Secondary | ICD-10-CM | POA: Diagnosis not present

## 2023-07-31 DIAGNOSIS — Z961 Presence of intraocular lens: Secondary | ICD-10-CM | POA: Diagnosis not present

## 2023-07-31 DIAGNOSIS — Z8546 Personal history of malignant neoplasm of prostate: Secondary | ICD-10-CM | POA: Insufficient documentation

## 2023-07-31 DIAGNOSIS — E785 Hyperlipidemia, unspecified: Secondary | ICD-10-CM | POA: Insufficient documentation

## 2023-07-31 DIAGNOSIS — H401120 Primary open-angle glaucoma, left eye, stage unspecified: Secondary | ICD-10-CM | POA: Insufficient documentation

## 2023-07-31 DIAGNOSIS — C7951 Secondary malignant neoplasm of bone: Secondary | ICD-10-CM | POA: Insufficient documentation

## 2023-07-31 DIAGNOSIS — H401123 Primary open-angle glaucoma, left eye, severe stage: Secondary | ICD-10-CM | POA: Diagnosis not present

## 2023-07-31 DIAGNOSIS — I2510 Atherosclerotic heart disease of native coronary artery without angina pectoris: Secondary | ICD-10-CM | POA: Diagnosis not present

## 2023-07-31 DIAGNOSIS — I10 Essential (primary) hypertension: Secondary | ICD-10-CM | POA: Diagnosis not present

## 2023-07-31 DIAGNOSIS — H2589 Other age-related cataract: Secondary | ICD-10-CM | POA: Diagnosis not present

## 2023-07-31 DIAGNOSIS — J449 Chronic obstructive pulmonary disease, unspecified: Secondary | ICD-10-CM | POA: Insufficient documentation

## 2023-07-31 DIAGNOSIS — E1136 Type 2 diabetes mellitus with diabetic cataract: Secondary | ICD-10-CM | POA: Diagnosis not present

## 2023-07-31 HISTORY — PX: CATARACT EXTRACTION W/PHACO: SHX586

## 2023-07-31 SURGERY — PHACOEMULSIFICATION, CATARACT, WITH IOL INSERTION
Anesthesia: Monitor Anesthesia Care | Site: Eye | Laterality: Left

## 2023-07-31 MED ORDER — TRYPAN BLUE 0.06 % IO SOSY
PREFILLED_SYRINGE | INTRAOCULAR | Status: DC | PRN
  Administered 2023-07-31: .2 mL via INTRAOCULAR

## 2023-07-31 MED ORDER — PHENYLEPHRINE-KETOROLAC 1-0.3 % IO SOLN
INTRAOCULAR | Status: DC | PRN
  Administered 2023-07-31: 114 mL via OPHTHALMIC

## 2023-07-31 MED ORDER — SIGHTPATH DOSE#1 NA HYALUR & NA CHOND-NA HYALUR IO KIT
PACK | INTRAOCULAR | Status: DC | PRN
  Administered 2023-07-31: 1 via OPHTHALMIC

## 2023-07-31 MED ORDER — SIGHTPATH DOSE#1 BSS IO SOLN
INTRAOCULAR | Status: DC | PRN
  Administered 2023-07-31: 15 mL

## 2023-07-31 MED ORDER — TETRACAINE HCL 0.5 % OP SOLN
1.0000 [drp] | OPHTHALMIC | Status: DC | PRN
  Administered 2023-07-31 (×3): 1 [drp] via OPHTHALMIC

## 2023-07-31 MED ORDER — MOXIFLOXACIN HCL 0.5 % OP SOLN
OPHTHALMIC | Status: DC | PRN
  Administered 2023-07-31: .2 mL via OPHTHALMIC

## 2023-07-31 MED ORDER — ARMC OPHTHALMIC DILATING DROPS
1.0000 | OPHTHALMIC | Status: DC | PRN
  Administered 2023-07-31 (×3): 1 via OPHTHALMIC

## 2023-07-31 MED ORDER — FENTANYL CITRATE (PF) 100 MCG/2ML IJ SOLN
INTRAMUSCULAR | Status: DC | PRN
  Administered 2023-07-31: 50 ug via INTRAVENOUS

## 2023-07-31 MED ORDER — LACTATED RINGERS IV SOLN: INTRAVENOUS | Status: DC

## 2023-07-31 MED ORDER — MIDAZOLAM HCL 2 MG/2ML IJ SOLN
INTRAMUSCULAR | Status: DC | PRN
  Administered 2023-07-31: 1 mg via INTRAVENOUS

## 2023-07-31 MED ORDER — LIDOCAINE HCL (PF) 2 % IJ SOLN
INTRAOCULAR | Status: DC | PRN
  Administered 2023-07-31: 1 mL via INTRAOCULAR

## 2023-07-31 MED ORDER — BRIMONIDINE TARTRATE-TIMOLOL 0.2-0.5 % OP SOLN
OPHTHALMIC | Status: DC | PRN
  Administered 2023-07-31: 1 [drp] via OPHTHALMIC

## 2023-07-31 SURGICAL SUPPLY — 12 items
BLADE DUAL KAHOOK SINGLE USE (BLADE) IMPLANT
CANNULA ANT/CHMB 27G (MISCELLANEOUS) IMPLANT
CANNULA ANT/CHMB 27GA (MISCELLANEOUS) ×1
CATARACT SUITE SIGHTPATH (MISCELLANEOUS) ×1
DISSECTOR HYDRO NUCLEUS 50X22 (MISCELLANEOUS) ×1 IMPLANT
DRSG TEGADERM 2-3/8X2-3/4 SM (GAUZE/BANDAGES/DRESSINGS) ×1 IMPLANT
FEE CATARACT SUITE SIGHTPATH (MISCELLANEOUS) ×1 IMPLANT
GLOVE SURG SYN 7.5 E (GLOVE) ×1
GLOVE SURG SYN 7.5 PF PI (GLOVE) ×1 IMPLANT
GLOVE SURG SYN 8.5 E (GLOVE) ×1
GLOVE SURG SYN 8.5 PF PI (GLOVE) ×1 IMPLANT
LENS IOL TECNIS EYHANCE 19.0 (Intraocular Lens) IMPLANT

## 2023-07-31 NOTE — Op Note (Signed)
OPERATIVE NOTE  Travis Salazar [id number] 07/31/2023   PREOPERATIVE DIAGNOSIS: Nuclear sclerotic cataract left eye. H25.12  End stage Primary Open Angle Glaucoma left eye N82.9562   POSTOPERATIVE DIAGNOSIS: Nuclear sclerotic cataract left eye. H25.12  End stage Primary Open Angle Glaucoma right eye H40.1123   PROCEDURE:  Phacoemusification with posterior chamber intraocular lens placement of the left eye  [place] Dual Blade goniotomy left eye Ultrasound time: Procedure(s): CATARACT EXTRACTION PHACO AND INTRAOCULAR LENS PLACEMENT (IOC) LEFT OMIDRIA VISION BLUE K[place]OOK DUAL BLADE GONIOTOMY  7.40  01:00.8 (Left)  LENS:   Implant Name Type Inv. Item Serial No. Manufacturer Lot No. LRB No. Used Action  LENS IOL TECNIS EYHANCE 19.0 - [id number] Intraocular Lens LENS IOL TECNIS EYHANCE 19.0 [id number] [employer]  Left 1 Implanted      SURGEON:  Julious Payer. Rolley Sims, MD   ANESTHESIA:  Topical with tetracaine drops, augmented with 1% preservative-free intracameral lidocaine.   COMPLICATIONS:  None.   DESCRIPTION OF PROCEDURE:  The patient was identified in the holding room and transported to the operating room and placed in the supine position under the operating microscope.  The left eye was identified as the operative eye, which was prepped and draped in the usual sterile ophthalmic fashion.   A 1 millimeter clear-corneal paracentesis was made inferotemporally. Preservative-free 1% lidocaine mixed with 1:1,000 bisulfite-free aqueous solution of epinephrine was injected into the anterior chamber. Thre red reflex was impaired due to diffuse cortical spokes, so Trypan blue was injected intracamerally to stain the anterior capsule and facilitate safe creation of the capsulorrhexis. The anterior chamber was then filled with Viscoat viscoelastic. A 2.4 millimeter keratome was used to make a clear-corneal incision superotemporally. A curvilinear capsulorrhexis was made with a cystotome and capsulorrhexis  forceps.   The microscope was adjusted and a gonioprism was used to visualize the trabecular meshwork.  The [facility] Dual Blade was advanced across the anterior chamber under viscoelastic.  The blade was used to mark the trabecular meshwork at the 7:30 position.  The blade was placed two clock hours clockwise into the meshwork.  Proper positioning was confirmed.  The blade was passed counterclockwise through the meshwork to excise approximately two to three clock-hours of trabecular meshwork. The microscope was adjusted back to its original position.  Balanced salt solution was used to hydrodissect and hydrodelineate the nucleus. Phacoemulsification was then used to remove the lens nucleus and epinucleus. The remaining cortex was then removed using the irrigation and aspiration handpiece. Provisc was then placed into the capsular bag to distend it for lens placement. A +19.00 D DIB00 intraocular lens was then injected into the capsular bag. The remaining viscoelastic was aspirated.   Wounds were hydrated with balanced salt solution.  The anterior chamber was inflated to a physiologic pressure with balanced salt solution.  No wound leaks were noted. Vigamox was injected intracamerally.  Timolol and Brimonidine drops were applied to the eye.  The patient was taken to the recovery room in stable condition without complications of anesthesia or surgery.  Rolly Pancake [place] 07/31/2023, 11:40 AM

## 2023-07-31 NOTE — H&P (Signed)
[facility]   Primary Care Physician:  Ronnald Ramp, MD Ophthalmologist: Dr. Deberah Pelton  Pre-Procedure History & Physical: HPI:  Travis Salazar. is a 87 y.o. male here for cataract surgery.   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) (HCC)    History of prostate cancer    History of pulmonary embolus (PE)    Hyperlipidemia    Hypertension    Mass of upper lobe of right lung    Mild mitral stenosis by prior echocardiogram    Moderate to severe aortic stenosis    Primary open-angle glaucoma, bilateral, severe stage    Seizures (HCC)    Stroke [facility])     Past Surgical History:  Procedure Laterality Date   CATARACT EXTRACTION W/PHACO Right 07/16/2023   Procedure: CATARACT EXTRACTION PHACO AND INTRAOCULAR LENS PLACEMENT (IOC) RIGHT MALYUGIN, OMIDRIA, VISION BLUE, [place] DUAL BLADE GONIOTOMY 8.19 1.03.2;  Surgeon: Estanislado Pandy, MD;  Location: [facility];  Service: Ophthalmology;  Laterality: Right;   LIPOMA EXCISION     neck   PROSTATECTOMY     due to prostate cancer    Prior to Admission medications   Medication Sig Start Date End Date Taking? Authorizing Provider  acetaminophen (TYLENOL) 500 MG tablet Take 500 mg by mouth daily as needed (back pain).    [provider]  amLODipine (NORVASC) 10 MG tablet Take 1 tablet (10 mg total) by mouth daily. 02/05/23   Simmons-Robinson, Tawanna Cooler, MD  apixaban (ELIQUIS) 5 MG TABS tablet Take 1 tablet (5 mg total) by mouth 2 (two) times daily. 02/05/23   Simmons-Robinson, Tawanna Cooler, MD  atorvastatin (LIPITOR) 40 MG tablet Take 1 tablet (40 mg total) by mouth daily at 6 PM. 02/05/23   Simmons-Robinson, Tawanna Cooler, MD  brimonidine (ALPHAGAN) 0.2 % ophthalmic solution Place 1 drop into both eyes 2 (two) times daily.    [provider]  DIAZEPAM PO Take 1 tablet by mouth as needed (seizures). Patient not taking: Reported on 07/16/2023     [provider]  diazePAM, 15 MG Dose, (VALTOCO 15 MG DOSE) 2 x 7.5 MG/0.1ML LQPK Spray in each nostril as needed for seizure. May use second dose after 4 hours if needed. Patient not taking: Reported on 07/16/2023 04/02/22   Van Clines, MD  dorzolamide (TRUSOPT) 2 % ophthalmic solution Place 1 drop into both eyes 2 (two) times daily.    [provider]  lacosamide (VIMPAT) 50 MG TABS tablet Take 1 tablet (50 mg total) by mouth 2 (two) times daily. 02/18/23   Van Clines, MD  latanoprost (XALATAN) 0.005 % ophthalmic solution Place 1 drop into both eyes at bedtime.    [provider]  levETIRAcetam (KEPPRA) 1000 MG tablet Take 2 tablets (2,000 mg total) by mouth 2 (two) times daily. 02/18/23   Van Clines, MD  metoprolol tartrate (LOPRESSOR) 25 MG tablet Take 0.5 tablets (12.5 mg total) by mouth 2 (two) times daily. 02/05/23   Simmons-Robinson, Tawanna Cooler, MD  mirtazapine (REMERON SOL-TAB) 15 MG disintegrating tablet Take 1 tablet (15 mg total) by mouth at bedtime. Patient not taking: Reported on 07/09/2023 02/07/23   Simmons-Robinson, Tawanna Cooler, MD  potassium chloride SA (KLOR-CON M) 20 MEQ tablet Take 2 tablets (40 mEq total) by mouth daily for 2 days. Patient not taking: Reported on 06/09/2023 04/11/23 04/13/23  Dorcas Carrow, MD  timolol (BETIMOL) 0.5 % ophthalmic solution Place 1 drop into both eyes 2 (two)  times daily. Patient not taking: Reported on 07/09/2023    [provider]    Allergies as of 07/01/2023   (No Known Allergies)    Family History  Problem Relation Age of Onset   COPD Mother    Heart attack Father     Social History   Socioeconomic History   Marital status: Married    Spouse name: [name]   Number of children: 4   Years of education: 10th grade   Highest education level: 9th grade  Occupational History   Occupation: retired  Tobacco Use   Smoking status: Former    Current packs/day: 0.00    Average packs/day: 1 pack/day for  50.0 years (50.0 ttl pk-yrs)    Types: Cigarettes    Start date: 12/16/1959    Quit date: 12/15/2009    Years since quitting: 13.6   Smokeless tobacco: Never  Vaping Use   Vaping status: Never Used  Substance and Sexual Activity   Alcohol use: No    Alcohol/week: 0.0 standard drinks of alcohol   Drug use: No   Sexual activity: Not on file  Other Topics Concern   Not on file  Social History Narrative   ** Merged History Encounter ** Right handed    Caffeine 2 cups daily   Home is one story   Social Determinants of Health   Financial Resource Strain: Low Risk  (04/15/2023)   Overall Financial Resource Strain (CARDIA)    Difficulty of Paying Living Expenses: Not hard at all  Food Insecurity: No Food Insecurity (04/15/2023)   Hunger Vital Sign    Worried About Running Out of Food in the Last Year: Never true    Ran Out of Food in the Last Year: Never true  Transportation Needs: No Transportation Needs (04/15/2023)   PRAPARE - Administrator, Civil Service (Medical): No    Lack of Transportation (Non-Medical): No  Physical Activity: Sufficiently Active (04/15/2023)   Exercise Vital Sign    Days of Exercise per Week: 5 days    Minutes of Exercise per Session: 30 min  Stress: No Stress Concern Present (04/15/2023)   [employer] of Occupational Health - Occupational Stress Questionnaire    Feeling of Stress : Not at all  Social Connections: Moderately Isolated (04/15/2023)   Social Connection and Isolation Panel [NHANES]    Frequency of Communication with Friends and Family: More than three times a week    Frequency of Social Gatherings with Friends and Family: Once a week    Attends Religious Services: More than 4 times per year    Active Member of [employer] or Organizations: No    Attends Banker Meetings: Never    Marital Status: Widowed  Intimate Partner Violence: Not At Risk (04/15/2023)   Humiliation, Afraid, Rape, and Kick questionnaire    Fear of  Current or Ex-Partner: No    Emotionally Abused: No    Physically Abused: No    Sexually Abused: No    Review of Systems: See HPI, otherwise negative ROS  Physical Exam: There were no vitals taken for this visit. General:   Alert, cooperative in NAD Head:  Normocephalic and atraumatic. Respiratory:  Normal work of breathing. Cardiovascular:  RRR  Impression/Plan: Travis Salazar. is here for cataract surgery.  Risks, benefits, limitations, and alternatives regarding cataract surgery have been reviewed with the patient.  Questions have been answered.  All parties agreeable.   Estanislado Pandy, MD  07/31/2023, 7:10 AM

## 2023-07-31 NOTE — Transfer of Care (Signed)
Immediate Anesthesia Transfer of Care Note  Patient: Travis Salazar.  Procedure(s) Performed: CATARACT EXTRACTION PHACO AND INTRAOCULAR LENS PLACEMENT (IOC) LEFT OMIDRIA VISION BLUE [place] DUAL BLADE GONIOTOMY  7.40  01:00.8 (Left: Eye)  Patient Location: PACU  Anesthesia Type: MAC  Level of Consciousness: awake, alert  and patient cooperative  Airway and Oxygen Therapy: Patient Spontanous Breathing and Patient connected to supplemental oxygen  Post-op Assessment: Post-op Vital signs reviewed, Patient's Cardiovascular Status Stable, Respiratory Function Stable, Patent Airway and No signs of Nausea or vomiting  Post-op Vital Signs: Reviewed and stable  Complications: No notable events documented.

## 2023-07-31 NOTE — Anesthesia Postprocedure Evaluation (Signed)
Anesthesia Post Note  Patient: Travis Salazar.  Procedure(s) Performed: CATARACT EXTRACTION PHACO AND INTRAOCULAR LENS PLACEMENT (IOC) LEFT OMIDRIA VISION BLUE [place] DUAL BLADE GONIOTOMY  7.40  01:00.8 (Left: Eye)  Patient location during evaluation: PACU Anesthesia Type: MAC Level of consciousness: awake and alert Pain management: pain level controlled Vital Signs Assessment: post-procedure vital signs reviewed and stable Respiratory status: spontaneous breathing, nonlabored ventilation, respiratory function stable and patient connected to nasal cannula oxygen Cardiovascular status: stable and blood pressure returned to baseline Postop Assessment: no apparent nausea or vomiting Anesthetic complications: no   No notable events documented.   Last Vitals:  Vitals:   07/31/23 1141 07/31/23 1145  BP: 100/60 111/72  Pulse: 83 68  Resp: 20 (!) 9  Temp: (!) 36.4 C   SpO2: 100% 100%    Last Pain:  Vitals:   07/31/23 1145  TempSrc:   PainSc: 0-No pain                  C 

## 2023-08-01 ENCOUNTER — Encounter: Payer: Self-pay | Admitting: Ophthalmology

## 2023-08-08 DIAGNOSIS — H04123 Dry eye syndrome of bilateral lacrimal glands: Secondary | ICD-10-CM | POA: Diagnosis not present

## 2023-08-25 ENCOUNTER — Telehealth: Payer: Self-pay | Admitting: Family Medicine

## 2023-08-25 DIAGNOSIS — I10 Essential (primary) hypertension: Secondary | ICD-10-CM

## 2023-08-25 MED ORDER — AMLODIPINE BESYLATE 10 MG PO TABS: 10.0000 mg | ORAL_TABLET | Freq: Every day | ORAL | 3 refills | Status: DC

## 2023-08-25 NOTE — Telephone Encounter (Signed)
done

## 2023-08-25 NOTE — Telephone Encounter (Signed)
[facility] faxed refill request for the following medications:  amLODipine (NORVASC) 10 MG tablet   Please advise.

## 2023-08-27 ENCOUNTER — Other Ambulatory Visit: Payer: Self-pay | Admitting: Neurology

## 2023-08-27 MED ORDER — LACOSAMIDE 50 MG PO TABS: 50.0000 mg | ORAL_TABLET | Freq: Two times a day (BID) | ORAL | 3 refills | Status: DC

## 2023-08-28 ENCOUNTER — Telehealth: Payer: Self-pay | Admitting: Neurology

## 2023-08-28 NOTE — Telephone Encounter (Signed)
1. Which medications need refilled? (List name and dosage, if known)   Disp Refills Start End  lacosamide        2. Which pharmacy/location is medication to be sent to? (include street and city if local pharmacy) [facility]  3. Do they need a 30 day or 90 day supply? 90 day supply

## 2023-08-29 ENCOUNTER — Other Ambulatory Visit: Payer: Self-pay

## 2023-08-29 DIAGNOSIS — G40201 Localization-related (focal) (partial) symptomatic epilepsy and epileptic syndromes with complex partial seizures, not intractable, with status epilepticus: Secondary | ICD-10-CM

## 2023-08-29 MED ORDER — LACOSAMIDE 50 MG PO TABS
50.0000 mg | ORAL_TABLET | Freq: Two times a day (BID) | ORAL | 0 refills | Status: DC
Start: 2023-08-29 — End: 2023-09-09

## 2023-08-29 MED ORDER — LACOSAMIDE 50 MG PO TABS
50.0000 mg | ORAL_TABLET | Freq: Two times a day (BID) | ORAL | 0 refills | Status: DC
Start: 2023-08-29 — End: 2023-08-29

## 2023-09-09 ENCOUNTER — Ambulatory Visit: Payer: Medicare HMO | Admitting: Neurology

## 2023-09-09 ENCOUNTER — Encounter: Payer: Self-pay | Admitting: Neurology

## 2023-09-09 DIAGNOSIS — Z8673 Personal history of transient ischemic attack (TIA), and cerebral infarction without residual deficits: Secondary | ICD-10-CM

## 2023-09-09 DIAGNOSIS — G40201 Localization-related (focal) (partial) symptomatic epilepsy and epileptic syndromes with complex partial seizures, not intractable, with status epilepticus: Secondary | ICD-10-CM

## 2023-09-09 MED ORDER — LACOSAMIDE 50 MG PO TABS
50.0000 mg | ORAL_TABLET | Freq: Two times a day (BID) | ORAL | 3 refills | Status: DC
Start: 2023-09-09 — End: 2024-03-25

## 2023-09-09 MED ORDER — LEVETIRACETAM 1000 MG PO TABS: 2000.0000 mg | ORAL_TABLET | Freq: Two times a day (BID) | ORAL | 3 refills | Status: DC

## 2023-09-09 NOTE — Progress Notes (Signed)
NEUROLOGY FOLLOW UP OFFICE NOTE  Travis Salazar [id number] November 27, 1936  HISTORY OF PRESENT ILLNESS: I had the pleasure of seeing Travis Salazar. in follow-up in the neurology clinic on 09/09/2023.  The patient was last seen 6 months ago for seizures. He is again accompanied by his daughter Travis Salazar who helps supplement the history today.  Records and images were personally reviewed where available.  Since his last visit, he was admitted to [facility] on 04/10/23 for slurred speech and dizziness. He had vomiting and slurring. Family administered intranasal Valtoco and symptoms improved. Levetiracetam level was 74.2 ,Lacosamide level 5.2. Head CT no acute changes, chronic large right MCA territory infarct. He does recall having left-sided jerking that time. No further similar episodes. He continues on Levetiracetam 2000mg  BID and Lacosamide 50mg  BID without side effects. He denies any headaches, dizziness, no falls. Sleep is good. He states mood is good, Travis Salazar reports he is "angry a lot, but calmed down a little bit." His other daughter [name] manages medications. He has been seeing his eye doctor and had cataract surgery in July and August.    History on Initial Assessment 09/20/2021: This is an 87 year old right-handed man with a history of hypertension, hyperlipidemia, PE on Eliquis, prostate cancer, prior R MCA stroke, presenting to establish care for new onset seizures. Records from his prior neurologist Dr. Pearlean Salazar and hospital records were reviewed. His last visit with [facility] was in 11/2019. He was admitted to [facility] on 08/13/21, He recalls feeling dizzy, "something just was not right, he could talk and told his wife to call EMS and words were slurred. EMS notes indicated left-sided weakness with note of left facial and arm twitching on EMS arrival.  Head CT no acute changes, he was started on Dilantin and given repeated doses of Ativan due to continued focal seizure activity. As seizure activity increased, he was  unable to move left arm at all, able to lift right arm without drift. EEG showed LPDs on the right hemisphere, maximal right temporal region with some evolution in frequency but no definite seizure. Background showed diffuse slowing and lateralized right hemisphere slowing. MRI brain no acute changes, right MCA encephalomalacia with chronic blood products. Follow-up EEG showed right temporal sharp waves. He had an MRI brain on 08/22/21 for vision changes, MRI brain no change from prior. He had an extensive DVT and known right lower lobe lung mass which he declined further workup on.   He lives with his wife, his daughter has been staying with them since hospitalization, managing medications. She denies any further seizures since 08/13/21, no staring/unresponsive episodes. He feels his left leg is 90% better. He has glaucoma with blurred vision and bilateral loss of peripheral vision. He denies any olfactory/gustatory hallucinations, deja vu, rising epigastric sensation, focal tingling/weakness, myoclonic jerks. He denies any headaches, dizziness, neck/back pain, bowel/bladder dysfunction. He has been using a cane since hospital discharge, no falls. Sleep is good. No side effects on Levetiracetam 1000mg  BID. Mood is good, however his daughter reports he is "moody."   Epilepsy Risk Factors:  right MCA encephalomalacia. Otherwise he had a normal birth and early development.  There is no history of febrile convulsions, CNS infections such as meningitis/encephalitis, significant traumatic brain injury, neurosurgical procedures, or family history of seizures.   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     amLODipine (NORVASC) 10 MG tablet Take 1 tablet (10 mg total) by mouth daily. 90 tablet 3   apixaban (ELIQUIS) 5 MG TABS tablet Take 1 tablet (5 mg total) by mouth 2 (two) times daily. 180 tablet 3   atorvastatin (LIPITOR) 40 MG tablet Take 1 tablet (40 mg total) by mouth daily at 6 PM. 90 tablet 3   brimonidine (ALPHAGAN) 0.2 % ophthalmic solution Place 1 drop into both eyes 2 (two) times daily.     DIAZEPAM PO Take 1 tablet by mouth as needed (seizures). (Patient not taking: Reported on 07/16/2023)     diazePAM, 15 MG Dose, (VALTOCO 15 MG DOSE) 2 x 7.5 MG/0.1ML LQPK Spray in each nostril as needed for seizure. May use second dose after 4 hours if needed. (Patient not taking: Reported on 07/16/2023) 5 each 5   dorzolamide (TRUSOPT) 2 % ophthalmic solution Place 1 drop into both eyes 2 (two) times daily.     lacosamide (VIMPAT) 50 MG TABS tablet Take 1 tablet (50 mg total) by mouth 2 (two) times daily. 180 tablet 0   latanoprost (XALATAN) 0.005 % ophthalmic solution Place 1 drop into both eyes at bedtime.     levETIRAcetam (KEPPRA) 1000 MG tablet Take 2 tablets (2,000 mg total) by mouth 2 (two) times daily. 360 tablet 3   metoprolol tartrate (LOPRESSOR) 25 MG tablet Take 0.5 tablets (12.5 mg total) by mouth 2 (two) times daily. 90 tablet 3   mirtazapine (REMERON SOL-TAB) 15 MG disintegrating tablet Take 1 tablet (15 mg total) by mouth at bedtime. (Patient not taking: Reported on 07/09/2023) 30 tablet 1   potassium chloride SA (KLOR-CON M) 20 MEQ tablet Take 2 tablets (40 mEq total) by mouth daily for 2 days. (Patient not taking: Reported on 06/09/2023) 2 tablet 0   timolol (BETIMOL) 0.5 % ophthalmic solution Place 1  drop into both eyes 2 (two) times daily.     No current facility-administered medications on file prior to visit.    ALLERGIES: No Known Allergies  FAMILY HISTORY: Family History  Problem Relation Age of Onset   COPD Mother    Heart attack Father     SOCIAL HISTORY: Social History   Socioeconomic History   Marital status: Married    Spouse name: [name]   Number of children: 4   Years of education: 10th grade   Highest education level: 9th grade  Occupational History   Occupation: retired  Tobacco Use   Smoking status: Former    Current packs/day: 0.00    Average packs/day: 1 pack/day for 50.0 years (50.0 ttl pk-yrs)    Types: Cigarettes    Start date: 12/16/1959    Quit date: 12/15/2009    Years since quitting: 13.7   Smokeless tobacco: Never  Vaping Use   Vaping status: Never Used  Substance and Sexual Activity   Alcohol use: No    Alcohol/week: 0.0 standard drinks of alcohol  Drug use: No   Sexual activity: Not on file  Other Topics Concern   Not on file  Social History Narrative   ** Merged History Encounter ** Right handed    Caffeine 2 cups daily   Home is one story   Social Determinants of Health   Financial Resource Strain: Low Risk  (04/15/2023)   Overall Financial Resource Strain (CARDIA)    Difficulty of Paying Living Expenses: Not hard at all  Food Insecurity: No Food Insecurity (04/15/2023)   Hunger Vital Sign    Worried About Running Out of Food in the Last Year: Never true    Ran Out of Food in the Last Year: Never true  Transportation Needs: No Transportation Needs (04/15/2023)   PRAPARE - Administrator, Civil Service (Medical): No    Lack of Transportation (Non-Medical): No  Physical Activity: Sufficiently Active (04/15/2023)   Exercise Vital Sign    Days of Exercise per Week: 5 days    Minutes of Exercise per Session: 30 min  Stress: No Stress Concern Present (04/15/2023)   [employer] of Occupational Health -  Occupational Stress Questionnaire    Feeling of Stress : Not at all  Social Connections: Moderately Isolated (04/15/2023)   Social Connection and Isolation Panel [NHANES]    Frequency of Communication with Friends and Family: More than three times a week    Frequency of Social Gatherings with Friends and Family: Once a week    Attends Religious Services: More than 4 times per year    Active Member of [employer] or Organizations: No    Attends Banker Meetings: Never    Marital Status: Widowed  Intimate Partner Violence: Not At Risk (04/15/2023)   Humiliation, Afraid, Rape, and Kick questionnaire    Fear of Current or Ex-Partner: No    Emotionally Abused: No    Physically Abused: No    Sexually Abused: No     PHYSICAL EXAM: Vitals:   09/09/23 1508  BP: (!) 144/80  Pulse: 66  SpO2: 97%   General: No acute distress Head:  Normocephalic/atraumatic Skin/Extremities: No rash, no edema Neurological Exam: alert and awake. No aphasia or dysarthria. Fund of knowledge is appropriate.  Attention and concentration are normal.   Cranial nerves: Pupils equal, round. Extraocular movements intact with no nystagmus. He is again noted to have difficulty counting fingers, he has to move his head around to find fingers presented. No facial asymmetry.  Motor: Bulk and tone normal, muscle strength 5/5 throughout with no pronator drift.   Finger to nose testing intact.  Gait slow and cautious, no ataxia.    IMPRESSION: This is a pleasant 87 yo RH man with a history of hypertension, hyperlipidemia, PE on Eliquis, prostate cancer, prior R MCA stroke, who had new onset focal status epilepticus last 08/13/21 with left face/arm twitching.  EEG showed LPDs arising from the right temporal region/right hemisphere. MRI no acute changes, right MCA encephalomalacia. He is on Eliquis for secondary stroke prevention, continue control of vascular risk factors. He had an episode of slurred speech and dizziness in  April, he recalls there was some jerking. He was given prn Valtoco. No recurrence. Continue Lacosamide 50mg  BID and Levetiracetam 2000mg  BID (1000mg : 2 tabs BID), refills sent. We discussed [place] driving laws and seizures, but I am concerned about his vision as well affecting driving. Follow-up in 6 months, call for any changes.      Thank you for allowing me to participate in  his care.  Please do not hesitate to call for any questions or concerns.    Patrcia Dolly, M.D.   CC: Dr. Neita Garnet

## 2023-09-09 NOTE — Patient Instructions (Signed)
Always good to see you. Continue Lacosamide 50mg  twice a day and Levetiracetam 1000mg : take 2 tablets twice a day. Let me know if you need refills for the Valtoco nasal spray. Follow-up in 6 months, call for any changes.    Seizure Precautions: 1. If medication has been prescribed for you to prevent seizures, take it exactly as directed.  Do not stop taking the medicine without talking to your doctor first, even if you have not had a seizure in a long time.   2. Avoid activities in which a seizure would cause danger to yourself or to others.  Don't operate dangerous machinery, swim alone, or climb in high or dangerous places, such as on ladders, roofs, or girders.  Do not drive unless your doctor says you may.  3. If you have any warning that you may have a seizure, lay down in a safe place where you can't hurt yourself.    4.  No driving for 6 months from last seizure, as per [facility].   Please refer to the following link on the Epilepsy Foundation of America's website for more information: http://www.epilepsyfoundation.org/answerplace/Social/driving/drivingu.cfm   5.  Maintain good sleep hygiene.  6.  Contact your doctor if you have any problems that may be related to the medicine you are taking.  7.  Call 911 and bring the patient back to the ED if:        A.  The seizure lasts longer than 5 minutes.       B.  The patient doesn't awaken shortly after the seizure  C.  The patient has new problems such as difficulty seeing, speaking or moving  D.  The patient was injured during the seizure  E.  The patient has a temperature over 102 F (39C)  F.  The patient vomited and now is having trouble breathing

## 2023-09-12 DIAGNOSIS — H401123 Primary open-angle glaucoma, left eye, severe stage: Secondary | ICD-10-CM | POA: Diagnosis not present

## 2023-10-15 DIAGNOSIS — I2510 Atherosclerotic heart disease of native coronary artery without angina pectoris: Secondary | ICD-10-CM | POA: Diagnosis not present

## 2023-10-15 DIAGNOSIS — H409 Unspecified glaucoma: Secondary | ICD-10-CM | POA: Diagnosis not present

## 2023-10-15 DIAGNOSIS — G40909 Epilepsy, unspecified, not intractable, without status epilepticus: Secondary | ICD-10-CM | POA: Diagnosis not present

## 2023-10-15 DIAGNOSIS — Z8673 Personal history of transient ischemic attack (TIA), and cerebral infarction without residual deficits: Secondary | ICD-10-CM | POA: Diagnosis not present

## 2023-10-15 DIAGNOSIS — E785 Hyperlipidemia, unspecified: Secondary | ICD-10-CM | POA: Diagnosis not present

## 2023-10-15 DIAGNOSIS — Z7901 Long term (current) use of anticoagulants: Secondary | ICD-10-CM | POA: Diagnosis not present

## 2023-10-15 DIAGNOSIS — H547 Unspecified visual loss: Secondary | ICD-10-CM | POA: Diagnosis not present

## 2023-10-15 DIAGNOSIS — I10 Essential (primary) hypertension: Secondary | ICD-10-CM | POA: Diagnosis not present

## 2023-10-15 DIAGNOSIS — Z87891 Personal history of nicotine dependence: Secondary | ICD-10-CM | POA: Diagnosis not present

## 2023-10-15 DIAGNOSIS — D6869 Other thrombophilia: Secondary | ICD-10-CM | POA: Diagnosis not present

## 2023-10-15 DIAGNOSIS — I4891 Unspecified atrial fibrillation: Secondary | ICD-10-CM | POA: Diagnosis not present

## 2023-10-15 DIAGNOSIS — Z8546 Personal history of malignant neoplasm of prostate: Secondary | ICD-10-CM | POA: Diagnosis not present

## 2023-11-03 ENCOUNTER — Encounter: Payer: Self-pay | Admitting: Neurology

## 2023-12-22 ENCOUNTER — Other Ambulatory Visit: Payer: Self-pay | Admitting: Physician Assistant

## 2023-12-22 DIAGNOSIS — E7800 Pure hypercholesterolemia, unspecified: Secondary | ICD-10-CM

## 2023-12-29 ENCOUNTER — Other Ambulatory Visit: Payer: Self-pay | Admitting: Family Medicine

## 2023-12-29 DIAGNOSIS — I10 Essential (primary) hypertension: Secondary | ICD-10-CM

## 2024-01-21 ENCOUNTER — Other Ambulatory Visit: Payer: Self-pay | Admitting: Family Medicine

## 2024-01-21 DIAGNOSIS — I2699 Other pulmonary embolism without acute cor pulmonale: Secondary | ICD-10-CM

## 2024-02-11 ENCOUNTER — Other Ambulatory Visit (HOSPITAL_COMMUNITY): Payer: Medicare HMO

## 2024-02-11 ENCOUNTER — Ambulatory Visit: Payer: Medicare Other | Admitting: Internal Medicine

## 2024-02-17 ENCOUNTER — Other Ambulatory Visit: Payer: Self-pay | Admitting: Family Medicine

## 2024-02-17 DIAGNOSIS — I10 Essential (primary) hypertension: Secondary | ICD-10-CM

## 2024-02-17 NOTE — Telephone Encounter (Signed)
 [facility] requesting refill amLODipine (NORVASC) 10 MG tablet   Please advise

## 2024-02-19 ENCOUNTER — Other Ambulatory Visit: Payer: Self-pay | Admitting: Family Medicine

## 2024-02-19 DIAGNOSIS — I10 Essential (primary) hypertension: Secondary | ICD-10-CM

## 2024-02-19 MED ORDER — AMLODIPINE BESYLATE 10 MG PO TABS: 10.0000 mg | ORAL_TABLET | Freq: Every day | ORAL | 3 refills | Status: DC

## 2024-02-19 NOTE — Telephone Encounter (Signed)
 LOV 2*21*24 NOV 5*5*25 LRF 4*9*24 LABS [id number]

## 2024-02-19 NOTE — Telephone Encounter (Signed)
 [facility] is requesting refill amLODipine (NORVASC) 10 MG tablet  Please advise

## 2024-02-19 NOTE — Telephone Encounter (Signed)
 LOV 2*21*24 NOV 5*5*25 LRF 9*9*24 LABS [id number]

## 2024-03-04 ENCOUNTER — Telehealth: Payer: Self-pay | Admitting: Family Medicine

## 2024-03-04 DIAGNOSIS — R338 Other retention of urine: Secondary | ICD-10-CM | POA: Diagnosis not present

## 2024-03-04 DIAGNOSIS — C7951 Secondary malignant neoplasm of bone: Secondary | ICD-10-CM | POA: Diagnosis not present

## 2024-03-04 NOTE — Telephone Encounter (Signed)
 [facility] pharmacy is requesting refill amLODipine (NORVASC) 10 MG tablet   Please advise

## 2024-03-05 NOTE — Telephone Encounter (Signed)
 Attempted to contact the pt but was only able to leave a voice message regarding the requested medication, it has was sent to [facility] pharmacy with a 90 day supply and 3 refills

## 2024-03-05 NOTE — Progress Notes (Deleted)
 Patient ID: Travis Salazar. MRN: [id number] DOB/AGE: [birth year] 88 y.o.  Primary Care Physician:Simmons-Robinson, Tawanna Cooler, MD Primary Cardiologist: Anne Fu  CC:  Aortic valvular disease management     FOCUSED PROBLEM LIST:   Aortic stenosis TTE*** AVA 0.9, DI 0.3, MG 25, V-max 3.2, EF 50 to 55% TTE June 2024 EKG sinus rhythm without conduction defects Hypertension Hyperlipidemia Aortic atherosclerosis Chest CT 2023 CVA 2020 Residual seizure disorder PE 2022/VTE 2023 On indefinite Eliquis Frailty  January 2024: The patient is a 88 y.o. male with the indicated medical history here for recommendations regarding his aortic valvular disease.  He was seen by Dr. Anne Fu recently.  He was doing well at that appointment.  An echocardiogram was done which demonstrated progressive aortic valvular disease.  The patient is here with her daughter.  He is doing fairly well.  He denies any significant shortness of breath, chest pain, presyncope, syncope, or paroxysmal nocturnal dyspnea.  He does not do much in a regular day.  He will sometimes tinker with things in his workshop however his vision has gotten worse likely from macular degeneration so he is no longer able to do this.  He ambulates with a cane.  He does not do much physically in a day.  He fortunately has not required any emergency room visits or hospitalizations.  He has had no significant bleeding or bruising episodes while on Eliquis.  He denies any easy fatigability.  He lives in a one-story home and does not tire out with any of his activities of daily living.  He has not seen a dentist in many many years.  Plan: Per patient preference monitor for symptoms and follow-up in 3 months.   June 2024: In the interim the patient was seen in the emergency department at the end of April for dizziness.  Head CT was negative.  This was thought to be due to his seizure disorder.  He was discharged the next day.   The patient had an  echocardiogram done today that has not yet been read.  This demonstrates paradoxical low-flow low gradient aortic stenosis with a preserved ejection fraction and stroke-volume index of around 31 mL/m.  His mean gradient is 25 mmHg with a peak velocity of 3.15 m/s with a calculated aortic valve area 0.92 cm.  Compared to when I saw him last time in January his weight is stable.     He is here today with his caregiver.  He continues to do well.  He is able to transfer and walk around his house without any issues.  He on his own admission is pretty sedentary.  He however does not develop any dyspnea, presyncope, syncope, or chest discomfort with any of his activities of daily living.  He fortunately has not required any hospitalizations or emergency room visits for heart failure or other cardiac issues.     He does not see a dentist on a regular basis and has not seen one in a long time.  He still has some of his teeth.  Plan: Follow-up in 6 to 9 months.  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)     Past Surgical History:  Procedure Laterality Date   CATARACT EXTRACTION W/PHACO Right 07/16/2023   Procedure: CATARACT EXTRACTION PHACO AND INTRAOCULAR LENS PLACEMENT (IOC) RIGHT MALYUGIN, OMIDRIA, VISION BLUE, KAHOOK DUAL BLADE GONIOTOMY 8.19 1.03.2;  Surgeon: Estanislado Pandy, MD;  Location: [facility];  Service: Ophthalmology;  Laterality: Right;   CATARACT EXTRACTION W/PHACO Left 07/31/2023   Procedure: CATARACT EXTRACTION PHACO AND  INTRAOCULAR LENS PLACEMENT (IOC) LEFT OMIDRIA VISION BLUE KAHOOK DUAL BLADE GONIOTOMY  7.40  01:00.8;  Surgeon: Estanislado Pandy, MD;  Location: [facility];  Service: Ophthalmology;  Laterality: Left;   LIPOMA EXCISION     neck   PROSTATECTOMY     due to prostate cancer    Family History  Problem Relation Age of Onset   COPD Mother    Heart attack Father     Social History   Socioeconomic History   Marital status: Married    Spouse name: [name]   Number of children: 4   Years of education: 10th grade   Highest education level: 9th grade  Occupational History   Occupation: retired  Tobacco Use   Smoking status: Former    Current packs/day: 0.00    Average packs/day: 1 pack/day for 50.0 years (50.0 ttl pk-yrs)    Types: Cigarettes    Start date: 12/16/1959    Quit date: 12/15/2009    Years since quitting: 14.2   Smokeless tobacco: Never  Vaping Use   Vaping status: Never Used  Substance and Sexual Activity   Alcohol use: No    Alcohol/week: 0.0 standard drinks of alcohol   Drug use: No   Sexual activity: Not on file  Other Topics Concern   Not on file  Social History Narrative   ** Merged History Encounter ** Right handed    Caffeine 2 cups daily   Home is one story   Social Drivers of Health   Financial Resource Strain: Low Risk  (04/15/2023)   Overall Financial Resource Strain (CARDIA)    Difficulty of Paying Living Expenses: Not hard at all  Food Insecurity: No Food Insecurity (04/15/2023)   Hunger Vital Sign    Worried About Running Out of Food in the Last Year: Never true    Ran Out of Food in the Last Year: Never true  Transportation Needs: No Transportation Needs (04/15/2023)   PRAPARE - Administrator, Civil Service (Medical): No    Lack of Transportation (Non-Medical): No  Physical Activity: Sufficiently Active (04/15/2023)   Exercise Vital Sign    Days of Exercise per Week: 5 days    Minutes of Exercise per Session: 30 min   Stress: No Stress Concern Present (04/15/2023)   [employer] of Occupational Health - Occupational Stress Questionnaire    Feeling of Stress : Not at all  Social Connections: Moderately Isolated (04/15/2023)   Social Connection and Isolation Panel [NHANES]    Frequency of Communication with Friends and Family: More than three times a week    Frequency of Social Gatherings with Friends and Family: Once a week    Attends Religious Services: More than 4 times per year    Active Member of [employer] or Organizations: No    Attends Banker Meetings: Never    Marital Status:  Widowed  Intimate Partner Violence: Not At Risk (04/15/2023)   Humiliation, Afraid, Rape, and Kick questionnaire    Fear of Current or Ex-Partner: No    Emotionally Abused: No    Physically Abused: No    Sexually Abused: No     Prior to Admission medications   Medication Sig Start Date End Date Taking? Authorizing Provider  acetaminophen (TYLENOL) 500 MG tablet Take 500 mg by mouth daily as needed (back pain).    [provider]  amLODipine (NORVASC) 10 MG tablet Take 1 tablet (10 mg total) by mouth daily. 02/19/24   Simmons-Robinson, Makiera, MD  atorvastatin (LIPITOR) 40 MG tablet TAKE 1 TABLET BY MOUTH DAILY AT  6 PM 12/23/23   Simmons-Robinson, Makiera, MD  brimonidine (ALPHAGAN) 0.2 % ophthalmic solution Place 1 drop into both eyes 2 (two) times daily.    [provider]  DIAZEPAM PO Take 1 tablet by mouth as needed (seizures).    [provider]  diazePAM, 15 MG Dose, (VALTOCO 15 MG DOSE) 2 x 7.5 MG/0.1ML LQPK Spray in each nostril as needed for seizure. May use second dose after 4 hours if needed. 04/02/22   Van Clines, MD  dorzolamide (TRUSOPT) 2 % ophthalmic solution Place 1 drop into both eyes 2 (two) times daily.    [provider]  ELIQUIS 5 MG TABS tablet TAKE 1 TABLET BY MOUTH TWICE A DAY 01/21/24   Simmons-Robinson, Makiera, MD  lacosamide (VIMPAT) 50 MG TABS  tablet Take 1 tablet (50 mg total) by mouth 2 (two) times daily. 09/09/23   Van Clines, MD  latanoprost (XALATAN) 0.005 % ophthalmic solution Place 1 drop into both eyes at bedtime.    [provider]  levETIRAcetam (KEPPRA) 1000 MG tablet Take 2 tablets (2,000 mg total) by mouth 2 (two) times daily. 09/09/23   Van Clines, MD  metoprolol tartrate (LOPRESSOR) 25 MG tablet TAKE ONE-HALF TABLET BY MOUTH  TWICE DAILY 12/30/23   Malva Limes, MD  mirtazapine (REMERON SOL-TAB) 15 MG disintegrating tablet Take 1 tablet (15 mg total) by mouth at bedtime. 02/07/23   Simmons-Robinson, Makiera, MD  potassium chloride SA (KLOR-CON M) 20 MEQ tablet Take 2 tablets (40 mEq total) by mouth daily for 2 days. 04/11/23 09/09/23  Dorcas Carrow, MD  timolol (BETIMOL) 0.5 % ophthalmic solution Place 1 drop into both eyes 2 (two) times daily.    [provider]    No Known Allergies  REVIEW OF SYSTEMS:  General: no fevers/chills/night sweats Eyes: no blurry vision, diplopia, or amaurosis ENT: no sore throat or hearing loss Resp: no cough, wheezing, or hemoptysis CV: no edema or palpitations GI: no abdominal pain, nausea, vomiting, diarrhea, or constipation GU: no dysuria, frequency, or hematuria Skin: no rash Neuro: no headache, numbness, tingling, or weakness of extremities Musculoskeletal: no joint pain or swelling Heme: no bleeding, DVT, or easy bruising Endo: no polydipsia or polyuria  There were no vitals taken for this visit.  PHYSICAL EXAM: GEN:  AO x 3 in no acute distress HEENT: normal Dentition: Normal*** Neck: JVP normal. +2***carotid upstrokes without bruits. No thyromegaly. Lungs: equal expansion, clear bilaterally CV: Apex is discrete and nondisplaced, RRR without murmur or gallop*** Abd: soft, non-tender, non-distended; no bruit; positive bowel sounds Ext: no edema, ecchymoses, or cyanosis Vascular: 2+ femoral pulses, 2+ radial pulses       Skin: warm and dry  without rash Neuro: CN II-XII grossly intact; motor and sensory grossly intact  DATA AND STUDIES:  EKG:  EKG Interpretation Date/Time:    Ventricular Rate:    PR Interval:    QRS Duration:    QT Interval:    QTC Calculation:   R Axis:      Text Interpretation:          Cardiac Studies & Procedures   ______________________________________________________________________________________________     ECHOCARDIOGRAM  ECHOCARDIOGRAM COMPLETE 06/09/2023  Narrative ECHOCARDIOGRAM REPORT    Patient Name:   Travis Salazar. Date of Exam: 06/09/2023 Medical Rec #:  [id number]       Height:       68.0 in Accession #:    [id number]      Weight:       213.0 lb Date of Birth:  27-Jan-1936       BSA:          2.099 m Patient Age:    86 years        BP:           155/77 mmHg Patient Gender: M               HR:           71 bpm. Exam Location:  [address]  Procedure: 2D Echo, Cardiac Doppler, Color Doppler and Intracardiac Opacification Agent  Indications:    I35.0 Aortic Stenosis  History:        Patient has prior history of Echocardiogram examinations, most recent 12/17/2022. Stroke; Risk Factors:Hypertension and Dyslipidemia.  Sonographer:    Daphine Deutscher RDCS Referring Phys: [id number] Orbie Pyo  IMPRESSIONS   1. Left ventricular ejection fraction, by estimation, is 50 to 55%. The left ventricle has low normal function. The left ventricle demonstrates regional wall motion abnormalities. Focal hypokinesis in mid anterior/lateral wall Left ventricular diastolic parameters are indeterminate. 2. Right ventricular systolic function is normal. The right ventricular size is normal. Tricuspid regurgitation signal is inadequate for assessing PA pressure. 3. The mitral valve is normal in structure. Trivial mitral valve regurgitation. Mild mitral stenosis. The mean mitral valve gradient is 4.0 mmHg with average heart rate of 66 bpm. 4. The aortic valve is calcified.  There is severe calcifcation of the aortic valve. Aortic valve regurgitation is trivial. Moderate to severe aortic valve stenosis. Vmax 3.2 m/s, MG 25 mmHg, AVA 0.9cm^2, DI 0.30  FINDINGS Left Ventricle: Left ventricular ejection fraction, by estimation, is 50 to 55%. The left ventricle has low normal function. The left ventricle demonstrates regional wall motion abnormalities. Definity contrast agent was given IV to delineate the left ventricular endocardial borders. The left ventricular internal cavity size was normal in size. There is no left ventricular hypertrophy. Left ventricular diastolic parameters are indeterminate.  Right Ventricle: The right ventricular size is normal. No increase in right ventricular wall thickness. Right ventricular systolic function is normal. Tricuspid regurgitation signal is inadequate for assessing PA pressure.  Left Atrium: Left atrial size was normal in size.  Right Atrium: Right atrial size was normal in size.  Pericardium: There is no evidence of pericardial effusion.  Mitral Valve: The mitral valve is normal in structure. Trivial mitral valve regurgitation. Mild mitral valve stenosis. The mean mitral valve gradient is 4.0 mmHg with average heart rate of 66 bpm.  Tricuspid Valve: The tricuspid valve is normal in structure. Tricuspid valve regurgitation is trivial.  Aortic Valve: The aortic valve is calcified. There is severe calcifcation of the aortic valve. Aortic valve regurgitation is trivial. Moderate to severe aortic stenosis is  present. Aortic valve mean gradient measures 20.8 mmHg. Aortic valve peak gradient measures 34.9 mmHg. Aortic valve area, by VTI measures 1.10 cm.  Pulmonic Valve: The pulmonic valve was not well visualized. Pulmonic valve regurgitation is trivial.  Aorta: The aortic root and ascending aorta are structurally normal, with no evidence of dilitation.  IAS/Shunts: The interatrial septum was not well visualized.   LEFT  VENTRICLE PLAX 2D LVIDd:         4.40 cm   Diastology LVIDs:         2.70 cm   LV e' medial:    5.60 cm/s LV PW:         0.70 cm   LV E/e' medial:  15.2 LV IVS:        0.70 cm   LV e' lateral:   6.86 cm/s LVOT diam:     2.00 cm   LV E/e' lateral: 12.4 LV SV:         75 LV SV Index:   36 LVOT Area:     3.14 cm   RIGHT VENTRICLE             IVC RV Basal diam:  3.60 cm     IVC diam: 1.80 cm RV S prime:     11.50 cm/s TAPSE (M-mode): 2.1 cm  LEFT ATRIUM             Index        RIGHT ATRIUM           Index LA diam:        3.90 cm 1.86 cm/m   RA Area:     11.30 cm LA Vol (A2C):   38.6 ml 18.39 ml/m  RA Volume:   25.10 ml  11.96 ml/m LA Vol (A4C):   42.9 ml 20.44 ml/m LA Biplane Vol: 42.8 ml 20.39 ml/m AORTIC VALVE AV Area (Vmax):    1.09 cm AV Area (Vmean):   1.00 cm AV Area (VTI):     1.10 cm AV Vmax:           295.40 cm/s AV Vmean:          215.200 cm/s AV VTI:            0.684 m AV Peak Grad:      34.9 mmHg AV Mean Grad:      20.8 mmHg LVOT Vmax:         102.85 cm/s LVOT Vmean:        68.700 cm/s LVOT VTI:          0.239 m LVOT/AV VTI ratio: 0.35  AORTA Ao Root diam: 3.70 cm Ao Asc diam:  3.10 cm  MITRAL VALVE MV Area (PHT): 2.66 cm     SHUNTS MV Mean grad:  4.0 mmHg     Systemic VTI:  0.24 m MV Decel Time: 285 msec     Systemic Diam: 2.00 cm MV E velocity: 85.00 cm/s MV A velocity: 135.00 cm/s MV E/A ratio:  0.63  Epifanio Lesches MD Electronically signed by Epifanio Lesches MD Signature Date/Time: 06/09/2023/6:33:42 PM    Final    MONITORS  CARDIAC EVENT MONITOR 11/04/2019  Narrative  Sinus rhythm with first-degree AV block.  No evidence of atrial fibrillation  Transient episode of second-degree heart block type I. No indication for pacemaker  No significant pauses.  Donato Schultz, MD       ______________________________________________________________________________________________      04/10/2023: ALT 16 04/11/2023: BUN  9;  Creatinine, Ser 0.87; Hemoglobin 12.1; Platelets 125; Potassium 3.0; Sodium 138   STS RISK CALCULATOR: Pending  NHYA CLASS: ***    ASSESSMENT AND PLAN:   1. Nonrheumatic aortic valve stenosis   2. Essential hypertension   3. Hyperlipidemia LDL goal <70   4. Aortic atherosclerosis (HCC)   5. Cerebrovascular accident (CVA) due to embolism of right middle cerebral artery (HCC)   6. Deep vein thrombosis (DVT) of left lower extremity, unspecified chronicity, unspecified vein (HCC)   7. Secondary hypercoagulable state (HCC)   8. Seizure [facility])     Aortic stenosis:*** Hypertension: Continue Lopressor 12.5 mg twice daily and amlodipine 10 mg daily.*** Hyperlipidemia: Will check lipid panel and LFTs today though I do not not think that intensive lipid lowering is required in this advanced age patient.*** Aortic atherosclerosis: Continue Eliquis 5 mg twice daily instead of aspirin and continue atorvastatin 40 mg daily.*** History of stroke: Continue Eliquis 5 mg twice daily, atorvastatin 40 mg, and strict blood pressure control. History of DVT and PE: On indefinite anticoagulation; continue Eliquis 5 mg twice daily.  Patient does not yet qualify for reduced dose of Eliquis  Secondary hypercoagulable state: Continue Eliquis 5 mg twice daily. Seizure disorder: Followed by neurology.  Continue Vimpat 50 mg daily.   I have personally reviewed the patients imaging data as summarized above.  I have reviewed the natural history of aortic stenosis with the patient and family members who are present today. We have discussed the limitations of medical therapy and the poor prognosis associated with symptomatic aortic stenosis. We have also reviewed potential treatment options, including palliative medical therapy, conventional surgical aortic valve replacement, and transcatheter aortic valve replacement. We discussed treatment options in the context of this patient's specific comorbid medical  conditions.   All of the patient's questions were answered today. Will make further recommendations based on the results of studies outlined above.   I spent *** minutes reviewing all clinical data during and prior to this visit including all relevant imaging studies, laboratories, clinical information from other health systems and prior notes from both Cardiology and other specialties, interviewing the patient, conducting a complete physical examination, and coordinating care in order to formulate a comprehensive and personalized evaluation and treatment plan.   Orbie Pyo, MD  03/05/2024 2:50 PM    [facility] [address] [place], [place], [place]  [address] Phone: [phone]; Fax: [phone]

## 2024-03-08 ENCOUNTER — Telehealth (HOSPITAL_COMMUNITY): Payer: Self-pay | Admitting: Internal Medicine

## 2024-03-08 ENCOUNTER — Telehealth: Payer: Self-pay | Admitting: Family Medicine

## 2024-03-08 NOTE — Telephone Encounter (Signed)
 [facility] is requesting refill amLODipine (NORVASC) 10 MG tablet   Please advise

## 2024-03-08 NOTE — Telephone Encounter (Signed)
 Patient called and cancelled echocardiogram x 3 for reason below:  03/08/2024 11:08 AM ZO:XWRU, BEVERLY  Cancel Rsn: Patient (Pt is sick and will c/b to r/s) x 3   Order will be removed from the echo WQ and if he calls back to reschedule we will reinstate the order . Thank you

## 2024-03-08 NOTE — Telephone Encounter (Signed)
 Called and spoke to the pt care giver (daughter Babette Relic) to inform her the medication has been sent to [facility] and not the Mail order. She verbally stated that was fine she would have that med picked up today and reach out to the mail server to let them know

## 2024-03-10 ENCOUNTER — Other Ambulatory Visit (HOSPITAL_COMMUNITY): Payer: Self-pay

## 2024-03-12 ENCOUNTER — Ambulatory Visit (HOSPITAL_COMMUNITY): Payer: Medicare Other

## 2024-03-12 ENCOUNTER — Ambulatory Visit: Payer: Medicare Other | Admitting: Internal Medicine

## 2024-03-12 DIAGNOSIS — R569 Unspecified convulsions: Secondary | ICD-10-CM

## 2024-03-12 DIAGNOSIS — D6869 Other thrombophilia: Secondary | ICD-10-CM

## 2024-03-12 DIAGNOSIS — I10 Essential (primary) hypertension: Secondary | ICD-10-CM

## 2024-03-12 DIAGNOSIS — I350 Nonrheumatic aortic (valve) stenosis: Secondary | ICD-10-CM

## 2024-03-12 DIAGNOSIS — I63411 Cerebral infarction due to embolism of right middle cerebral artery: Secondary | ICD-10-CM

## 2024-03-12 DIAGNOSIS — I82402 Acute embolism and thrombosis of unspecified deep veins of left lower extremity: Secondary | ICD-10-CM

## 2024-03-12 DIAGNOSIS — E785 Hyperlipidemia, unspecified: Secondary | ICD-10-CM

## 2024-03-12 DIAGNOSIS — I700 Atherosclerosis of aorta: Secondary | ICD-10-CM

## 2024-03-25 ENCOUNTER — Other Ambulatory Visit: Payer: Self-pay | Admitting: Neurology

## 2024-03-25 DIAGNOSIS — G40201 Localization-related (focal) (partial) symptomatic epilepsy and epileptic syndromes with complex partial seizures, not intractable, with status epilepticus: Secondary | ICD-10-CM

## 2024-04-06 ENCOUNTER — Ambulatory Visit: Payer: Medicare HMO | Admitting: Neurology

## 2024-04-19 ENCOUNTER — Other Ambulatory Visit: Payer: Self-pay | Admitting: Family Medicine

## 2024-04-19 ENCOUNTER — Ambulatory Visit (INDEPENDENT_AMBULATORY_CARE_PROVIDER_SITE_OTHER): Payer: Medicare HMO

## 2024-04-19 VITALS — Ht 68.0 in | Wt 183.0 lb

## 2024-04-19 DIAGNOSIS — I2699 Other pulmonary embolism without acute cor pulmonale: Secondary | ICD-10-CM

## 2024-04-19 DIAGNOSIS — Z0000 Encounter for general adult medical examination without abnormal findings: Secondary | ICD-10-CM

## 2024-04-19 NOTE — Progress Notes (Signed)
 Subjective:   Travis Husein. is a 88 y.o. who presents for a Medicare Wellness preventive visit.  Visit Complete: Virtual I connected with  Travis Hoh. on 04/19/24 by a audio enabled telemedicine application and verified that I am speaking with the correct person using two identifiers.  Patient Location: Home  Provider Location: Office/Clinic  I discussed the limitations of evaluation and management by telemedicine. The patient expressed understanding and agreed to proceed.  Vital Signs: Because this visit was a virtual/telehealth visit, some criteria may be missing or patient reported. Any vitals not documented were not able to be obtained and vitals that have been documented are patient reported.  VideoDeclined- This patient declined Librarian, academic. Therefore the visit was completed with audio only.  Persons Participating in Visit: Patient.  AWV Questionnaire: Yes: Patient Medicare AWV questionnaire was completed by the patient on 04/16/2024; I have confirmed that all information answered by patient is correct and no changes since this date.  Cardiac Risk Factors include: advanced age (>87men, >75 women);dyslipidemia;hypertension;male gender     Objective:    Today's Vitals   04/19/24 1132  Weight: 183 lb (83 kg)  Height: 5\' 8"  (1.727 m)   Body mass index is 27.83 kg/m.     04/19/2024   11:35 AM 09/09/2023    3:13 PM 07/31/2023    9:33 AM 07/16/2023    9:36 AM 04/15/2023    1:46 PM 04/10/2023    2:00 PM 02/18/2023    3:01 PM  Advanced Directives  Does Patient Have a Medical Advance Directive? No Yes Yes Yes Yes Yes Yes  Type of Special educational needs teacher of Attorney Living will Living will Healthcare Power of [place];Living will Living will Living will  Does patient want to make changes to medical advance directive?   No - Patient declined   No - Patient declined   Copy of Healthcare Power of Attorney in Chart?     No - copy  requested    Would patient like information on creating a medical advance directive? No - Patient declined          Current Medications (verified) Outpatient Encounter Medications as of 04/19/2024  Medication Sig   acetaminophen  (TYLENOL ) 500 MG tablet Take 500 mg by mouth daily as needed (back pain).   amLODipine  (NORVASC ) 10 MG tablet Take 1 tablet (10 mg total) by mouth daily.   atorvastatin  (LIPITOR) 40 MG tablet TAKE 1 TABLET BY MOUTH DAILY AT  6 PM   brimonidine  (ALPHAGAN ) 0.2 % ophthalmic solution Place 1 drop into both eyes 2 (two) times daily.   DIAZEPAM  PO Take 1 tablet by mouth as needed (seizures).   diazePAM , 15 MG Dose, (VALTOCO  15 MG DOSE) 2 x 7.5 MG/0.1ML LQPK Spray in each nostril as needed for seizure. May use second dose after 4 hours if needed.   dorzolamide  (TRUSOPT ) 2 % ophthalmic solution Place 1 drop into both eyes 2 (two) times daily.   ELIQUIS  5 MG TABS tablet TAKE 1 TABLET BY MOUTH TWICE A DAY   lacosamide  (VIMPAT ) 50 MG TABS tablet TAKE 1 TABLET BY MOUTH TWICE A DAY   latanoprost  (XALATAN ) 0.005 % ophthalmic solution Place 1 drop into both eyes at bedtime.   levETIRAcetam  (KEPPRA ) 1000 MG tablet Take 2 tablets (2,000 mg total) by mouth 2 (two) times daily.   metoprolol  tartrate (LOPRESSOR ) 25 MG tablet TAKE ONE-HALF TABLET BY MOUTH  TWICE DAILY   mirtazapine  (REMERON  SOL-TAB) 15  MG disintegrating tablet Take 1 tablet (15 mg total) by mouth at bedtime.   potassium chloride  SA (KLOR-CON  M) 20 MEQ tablet Take 2 tablets (40 mEq total) by mouth daily for 2 days.   timolol  (BETIMOL ) 0.5 % ophthalmic solution Place 1 drop into both eyes 2 (two) times daily.   No facility-administered encounter medications on file as of 04/19/2024.    Allergies (verified) Patient has no known allergies.   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)     History of prostate cancer    History of pulmonary embolus (PE)    Hyperlipidemia    Hypertension    Mass of upper lobe of right lung    Mild mitral stenosis by prior echocardiogram    Moderate to severe aortic stenosis    Primary open-angle glaucoma, bilateral, severe stage    Seizures (HCC)    Stroke [facility])    Past Surgical History:  Procedure Laterality Date   CATARACT EXTRACTION W/PHACO Right 07/16/2023   Procedure: CATARACT EXTRACTION PHACO AND INTRAOCULAR LENS PLACEMENT (IOC) RIGHT MALYUGIN, OMIDRIA , VISION BLUE, KAHOOK DUAL BLADE GONIOTOMY 8.19 1.03.2;  Surgeon: Trudi Fus, MD;  Location: [facility];  Service: Ophthalmology;  Laterality: Right;   CATARACT EXTRACTION W/PHACO Left 07/31/2023   Procedure: CATARACT EXTRACTION PHACO AND INTRAOCULAR LENS PLACEMENT (IOC) LEFT OMIDRIA  VISION BLUE KAHOOK DUAL BLADE GONIOTOMY  7.40  01:00.8;  Surgeon: Trudi Fus, MD;  Location: [facility];  Service: Ophthalmology;  Laterality: Left;   LIPOMA EXCISION     neck   PROSTATECTOMY     due to prostate cancer   Family History  Problem Relation Age of Onset   COPD Mother    Heart attack Father    Social History   Socioeconomic History   Marital status: Married    Spouse name: [name]   Number of children: 4   Years of education: 10th grade   Highest education level: 9th grade  Occupational History   Occupation: retired  Tobacco Use   Smoking status: Former    Current packs/day: 0.00    Average packs/day: 1 pack/day for 50.0 years (50.0 ttl pk-yrs)    Types: Cigarettes    Start date: 12/16/1959    Quit date: 12/15/2009    Years since quitting: 14.3   Smokeless tobacco: Never   Tobacco comments:    already quit smoking  Vaping Use   Vaping status: Never Used  Substance and Sexual Activity   Alcohol use: No   Drug use: No   Sexual activity: Never  Other Topics Concern   Not on file  Social History Narrative   ** Merged History Encounter  ** Right handed    Caffeine 2 cups daily   Home is one story   Social Drivers of Health   Financial Resource Strain: Low Risk  (04/16/2024)   Overall Financial Resource Strain (CARDIA)    Difficulty of Paying Living Expenses: Not hard at all  Food Insecurity: No Food Insecurity (04/16/2024)   Hunger Vital Sign    Worried About Running Out of Food in the Last Year: Never true    Ran Out of Food in the Last Year: Never true  Transportation Needs: No Transportation Needs (04/16/2024)   PRAPARE - Administrator, Civil Service (Medical): No    Lack of Transportation (  Non-Medical): No  Physical Activity: Inactive (04/16/2024)   Exercise Vital Sign    Days of Exercise per Week: 0 days    Minutes of Exercise per Session: 0 min  Stress: No Stress Concern Present (04/16/2024)   [employer] of Occupational Health - Occupational Stress Questionnaire    Feeling of Stress : Not at all  Social Connections: Unknown (04/16/2024)   Social Connection and Isolation Panel [NHANES]    Frequency of Communication with Friends and Family: More than three times a week    Frequency of Social Gatherings with Friends and Family: More than three times a week    Attends Religious Services: Patient declined    Database administrator or Organizations: No    Attends Banker Meetings: Never    Marital Status: Widowed    Tobacco Counseling Counseling given: Yes Tobacco comments: already quit smoking    Clinical Intake:  Pre-visit preparation completed: Yes  Pain : No/denies pain     BMI - recorded: 27.83 Nutritional Status: BMI 25 -29 Overweight Nutritional Risks: None Diabetes: No  Lab Results  Component Value Date   HGBA1C 5.9 (H) 08/29/2020   HGBA1C 5.8 04/25/2020   HGBA1C 6.0 (A) 01/24/2020     How often do you need to have someone help you when you read instructions, pamphlets, or other written materials from your doctor or pharmacy?: 1 - Never  Interpreter Needed?:  No  Information entered by :: Sally Crazier CMA   Activities of Daily Living     04/16/2024    9:42 AM 07/31/2023    9:27 AM  In your present state of health, do you have any difficulty performing the following activities:  Hearing? 0 0  Vision? 1 0  Difficulty concentrating or making decisions? 0 0  Walking or climbing stairs? 1 0  Dressing or bathing? 1   Doing errands, shopping? 0   Preparing Food and eating ? N   Using the Toilet? N   In the past six months, have you accidently leaked urine? Y   Do you have problems with loss of bowel control? Y   Managing your Medications? N   Managing your Finances? N   Housekeeping or managing your Housekeeping? N     Patient Care Team: Mimi Alt, MD as PCP - General (Family Medicine) Pa, [facility] as Consulting Physician (Optometry) Cherylene Corrente, Kizzie Perks, MD as Consulting Physician (Urology) Jhonny Moss, MD as Consulting Physician (Neurology) Mimi Alt, MD as Referring Physician (Family Medicine) Hugh Madura, MD as Consulting Physician (Cardiology) Trudi Fus, MD as Consulting Physician (Ophthalmology) Thukkani, Arun K, MD as Consulting Physician (Cardiology)  Indicate any recent Medical Services you may have received from other than [facility] providers in the past year (date may be approximate).     Assessment:   This is a routine wellness examination for Travis Salazar.  Hearing/Vision screen Hearing Screening - Comments:: Patient denies any hearing difficulties.   Vision Screening - Comments:: Wears rx glasses - up to date with routine eye exams  Patient sees [facility]     Goals Addressed             This Visit's Progress    Patient Stated       To stay active and healthy        Depression Screen     04/19/2024   11:39 AM 04/15/2023    1:42 PM 02/05/2023    1:29 PM 04/08/2022  12:47 PM 02/12/2022    1:25 PM 09/18/2021   10:25 AM 06/20/2021    2:25 PM  PHQ 2/9 Scores   PHQ - 2 Score 0 0 0 2 2 0 0  PHQ- 9 Score 0   2 4      Fall Risk     04/16/2024    9:42 AM 09/09/2023    3:13 PM 04/15/2023    1:41 PM 02/18/2023    3:01 PM 02/05/2023    1:29 PM  Fall Risk   Falls in the past year? 0 0 0 0 0  Number falls in past yr: 0  0 0 0  Injury with Fall? 0 0 0 0 0  Risk for fall due to : No Fall Risks  No Fall Risks  No Fall Risks  Follow up Falls prevention discussed;Falls evaluation completed Falls evaluation completed Falls prevention discussed;Education provided Falls evaluation completed     MEDICARE RISK AT HOME:  Medicare Risk at Home Any stairs in or around the home?: (Patient-Rptd) Yes If so, are there any without handrails?: (Patient-Rptd) Yes Home free of loose throw rugs in walkways, pet beds, electrical cords, etc?: (Patient-Rptd) Yes Adequate lighting in your home to reduce risk of falls?: (Patient-Rptd) Yes Life alert?: (Patient-Rptd) No Use of a cane, walker or w/c?: (Patient-Rptd) Yes Grab bars in the bathroom?: (Patient-Rptd) Yes Shower chair or bench in shower?: (Patient-Rptd) Yes Elevated toilet seat or a handicapped toilet?: (Patient-Rptd) No  TIMED UP AND GO:  Was the test performed?  No  Cognitive Function: 6CIT completed        04/19/2024   11:35 AM 04/15/2023    1:48 PM 04/17/2020    9:46 AM 04/14/2019    2:49 PM  6CIT Screen  What Year? 0 points 0 points 0 points 0 points  What month? 0 points 0 points 0 points 0 points  What time? 0 points 0 points 0 points 0 points  Count back from 20 0 points 0 points 0 points 0 points  Months in reverse 0 points 0 points 2 points 0 points  Repeat phrase 8 points 0 points 6 points 4 points  Total Score 8 points 0 points 8 points 4 points    Immunizations Immunization History  Administered Date(s) Administered   Influenza-Unspecified 10/16/2018   PFIZER(Purple Top)SARS-COV-2 Vaccination 12/18/2020   Pneumococcal Conjugate-13 10/04/2014   Pneumococcal Polysaccharide-23 01/11/2013    Zoster, Live 11/13/2012    Screening Tests Health Maintenance  Topic Date Due   DTaP/Tdap/Td (1 - Tdap) Never done   Zoster Vaccines- Shingrix (1 of 2) 10/16/1955   COVID-19 Vaccine (2 - Pfizer risk series) 01/08/2021   INFLUENZA VACCINE  07/16/2024   Medicare Annual Wellness (AWV)  04/19/2025   Pneumonia Vaccine 58+ Years old  Completed   HPV VACCINES  Aged Out   Meningococcal B Vaccine  Aged Out    Health Maintenance  Health Maintenance Due  Topic Date Due   DTaP/Tdap/Td (1 - Tdap) Never done   Zoster Vaccines- Shingrix (1 of 2) 10[birth year]16/1957   COVID-19 Vaccine (2 - Pfizer risk series) 01/08/2021   Health Maintenance Items Addressed: Patient advised of recommended vaccines. Otherwise health maintenance is up to date.  Additional Screening:  Vision Screening: Recommended annual ophthalmology exams for early detection of glaucoma and other disorders of the eye.  Dental Screening: Recommended annual dental exams for proper oral hygiene  Community Resource Referral / Chronic Care Management: CRR required this visit?  No  CCM required this visit?  No     Plan:     I have personally reviewed and noted the following in the patient's chart:   Medical and social history Use of alcohol, tobacco or illicit drugs  Current medications and supplements including opioid prescriptions. Patient is not currently taking opioid prescriptions. Functional ability and status Nutritional status Physical activity Advanced directives List of other physicians Hospitalizations, surgeries, and ER visits in previous 12 months Vitals Screenings to include cognitive, depression, and falls Referrals and appointments  In addition, I have reviewed and discussed with patient certain preventive protocols, quality metrics, and best practice recommendations. A written personalized care plan for preventive services as well as general preventive health recommendations were provided to patient.       Jermond Burkemper, CMA   04/19/2024   After Visit Summary: (MyChart) Due to this being a telephonic visit, the after visit summary with patients personalized plan was offered to patient via MyChart   Notes: Nothing significant to report at this time.

## 2024-04-19 NOTE — Patient Instructions (Signed)
 [name] , Thank you for taking time to come for your Medicare Wellness Visit. I appreciate your ongoing commitment to your health goals. Please review the following plan we discussed and let me know if I can assist you in the future.   Referrals/Orders/Follow-Ups/Clinician Recommendations:    Next Medicare AWV: Apr 26, 2024 at 1:50 pm video visit.      Aim for 30 minutes of exercise or brisk walking, 6-8 glasses of water, and 5 servings of fruits and vegetables each day.   This is a list of the screening recommended for you and due dates:  Health Maintenance  Topic Date Due   DTaP/Tdap/Td vaccine (1 - Tdap) Never done   Zoster (Shingles) Vaccine (1 of 2) 10/16/1957   COVID-19 Vaccine (2 - Pfizer risk series) 01/08/2021   Flu Shot  07/16/2024   Medicare Annual Wellness Visit  04/19/2025   Pneumonia Vaccine  Completed   HPV Vaccine  Aged Out   Meningitis B Vaccine  Aged Out    Advanced directives: (Declined) Advance directive discussed with you today. Even though you declined this today, please call our office should you change your mind, and we can give you the proper paperwork for you to fill out. Advance Care Planning is important because it:  [x]  Makes sure you receive the medical care that is consistent with your values, goals, and preferences  [x]  It provides guidance to your family and loved ones and it also reduces their decisional burden about whether or not they are making the right decisions based on what you want done  Follow the link provided in your after visit summary or read over the paperwork we have mailed to you to help you started getting your Advance Directives in place. If you need assistance in completing these, please reach out to us  so that we can help you!  Next Medicare Annual Wellness Visit scheduled for next year: yes  Understanding Your Risk for Falls Millions of people have serious injuries from falls each year. It is important to understand your risk of  falling. Talk with your health care provider about your risk and what you can do to lower it. If you do have a serious fall, make sure to tell your provider. Falling once raises your risk of falling again. How can falls affect me? Serious injuries from falls are common. These include: Broken bones, such as hip fractures. Head injuries, such as traumatic brain injuries (TBI) or concussions. A fear of falling can cause you to avoid activities and stay at home. This can make your muscles weaker and raise your risk for a fall. What can increase my risk? There are a number of risk factors that increase your risk for falling. The more risk factors you have, the higher your risk of falling. Serious injuries from a fall happen most often to people who are older than 86 years old. Teenagers and young adults ages 51-29 are also at higher risk. Common risk factors include: Weakness in the lower body. Being generally weak or confused due to long-term (chronic) illness. Dizziness or balance problems. Poor vision. Medicines that cause dizziness or drowsiness. These may include: Medicines for your blood pressure, heart, anxiety, insomnia, or swelling (edema). Pain medicines. Muscle relaxants. Other risk factors include: Drinking alcohol. Having had a fall in the past. Having foot pain or wearing improper footwear. Working at a dangerous job. Having any of the following in your home: Tripping hazards, such as floor clutter or loose rugs.  Poor lighting. Pets. Having dementia or memory loss. What actions can I take to lower my risk of falling?  Physical activity Stay physically fit. Do strength and balance exercises. Consider taking a regular class to build strength and balance. Yoga and tai chi are good options. Vision Have your eyes checked every year and your prescription for glasses or contacts updated as needed. Shoes and walking aids Wear non-skid shoes. Wear shoes that have rubber soles and  low heels. Do not wear high heels. Do not walk around the house in socks or slippers. Use a cane or walker as told by your provider. Home safety Attach secure railings on both sides of your stairs. Install grab bars for your bathtub, shower, and toilet. Use a non-skid mat in your bathtub or shower. Attach bath mats securely with double-sided, non-slip rug tape. Use good lighting in all rooms. Keep a flashlight near your bed. Make sure there is a clear path from your bed to the bathroom. Use night-lights. Do not use throw rugs. Make sure all carpeting is taped or tacked down securely. Remove all clutter from walkways and stairways, including extension cords. Repair uneven or broken steps and floors. Avoid walking on icy or slippery surfaces. Walk on the grass instead of on icy or slick sidewalks. Use ice melter to get rid of ice on walkways in the winter. Use a cordless phone. Questions to ask your health care provider Can you help me check my risk for a fall? Do any of my medicines make me more likely to fall? Should I take a vitamin D supplement? What exercises can I do to improve my strength and balance? Should I make an appointment to have my vision checked? Do I need a bone density test to check for weak bones (osteoporosis)? Would it help to use a cane or a walker? Where to find more information Centers for Disease Control and Prevention, STEADI: TonerPromos.no Community-Based Fall Prevention Programs: TonerPromos.no [employer] on Aging: BaseRingTones.pl Contact a health care provider if: You fall at home. You are afraid of falling at home. You feel weak, drowsy, or dizzy. This information is not intended to replace advice given to you by your health care provider. Make sure you discuss any questions you have with your health care provider. Document Revised: 08/05/2022 Document Reviewed: 08/05/2022 Elsevier Patient Education  2024 [employer].

## 2024-04-20 ENCOUNTER — Ambulatory Visit: Payer: Medicare HMO | Admitting: Neurology

## 2024-04-20 ENCOUNTER — Encounter: Payer: Self-pay | Admitting: Neurology

## 2024-04-20 VITALS — BP 135/80 | HR 75 | Ht 68.0 in | Wt 182.0 lb

## 2024-04-20 DIAGNOSIS — Z8673 Personal history of transient ischemic attack (TIA), and cerebral infarction without residual deficits: Secondary | ICD-10-CM

## 2024-04-20 DIAGNOSIS — G40201 Localization-related (focal) (partial) symptomatic epilepsy and epileptic syndromes with complex partial seizures, not intractable, with status epilepticus: Secondary | ICD-10-CM | POA: Diagnosis not present

## 2024-04-20 MED ORDER — LEVETIRACETAM 1000 MG PO TABS: 2000.0000 mg | ORAL_TABLET | Freq: Two times a day (BID) | ORAL | 3 refills | Status: DC

## 2024-04-20 MED ORDER — LACOSAMIDE 50 MG PO TABS
50.0000 mg | ORAL_TABLET | Freq: Two times a day (BID) | ORAL | 3 refills | Status: DC
Start: 2024-04-20 — End: 2024-06-10

## 2024-04-20 NOTE — Patient Instructions (Signed)
 Always good to see you. Continue all your medications.  Try wearing your glasses and see if you can count fingers better. If not, please see your eye doctor.  Follow-up in 6-8 months, call for any changes   Seizure Precautions: 1. If medication has been prescribed for you to prevent seizures, take it exactly as directed.  Do not stop taking the medicine without talking to your doctor first, even if you have not had a seizure in a long time.   2. Avoid activities in which a seizure would cause danger to yourself or to others.  Don't operate dangerous machinery, swim alone, or climb in high or dangerous places, such as on ladders, roofs, or girders.  Do not drive unless your doctor says you may.  3. If you have any warning that you may have a seizure, lay down in a safe place where you can't hurt yourself.    4.  No driving for 6 months from last seizure, as per [place]  state law.   Please refer to the following link on the Epilepsy Foundation of America's website for more information: http://www.epilepsyfoundation.org/answerplace/Social/driving/drivingu.cfm   5.  Maintain good sleep hygiene.  6.  Contact your doctor if you have any problems that may be related to the medicine you are taking.  7.  Call 911 and bring the patient back to the ED if:        A.  The seizure lasts longer than 5 minutes.       B.  The patient doesn't awaken shortly after the seizure  C.  The patient has new problems such as difficulty seeing, speaking or moving  D.  The patient was injured during the seizure  E.  The patient has a temperature over 102 F (39C)  F.  The patient vomited and now is having trouble breathing

## 2024-04-20 NOTE — Progress Notes (Signed)
 NEUROLOGY FOLLOW UP OFFICE NOTE  Travis Salazar [id number] 09-Nov-1936  HISTORY OF PRESENT ILLNESS: I had the pleasure of seeing Travis Salazar. in follow-up in the neurology clinic on 04/20/2024.  The patient was last seen 8 months ago for seizures. He is again accompanied by his daughter Travis Salazar who helps supplement the history today.  Records and images were personally reviewed where available.  Since his last visit, they report he has been doing well. No focal motor seizures with left facial/arm twitching since 03/2022. He had an episode of slurred speech and dizziness in 03/2023, he recalled having some jerking. Family administered Valtoco . No further similar instances. He is on Levetiracetam  2000mg  BID and Lacosamide  50mg  BID without side effects. He denies any headaches, dizziness, focal numbness/tingling/weakness. No falls but he bumps into things. He was going to bump into the doorframe into the office today. He has glasses but does not like to wear them. Travis Salazar reports he would be feeling around the table for his medications or for his spoon. Sleep and mood are okay.    History on Initial Assessment 09/20/2021: This is an 88 year old right-handed man with a history of hypertension, hyperlipidemia, PE on Eliquis , prostate cancer, prior R MCA stroke, presenting to establish care for new onset seizures. Records from his prior neurologist Dr. Janett Medin and hospital records were reviewed. His last visit with [facility] was in 11/2019. He was admitted to [facility] on 08/13/21, He recalls feeling dizzy, "something just was not right, he could talk and told his wife to call EMS and words were slurred. EMS notes indicated left-sided weakness with note of left facial and arm twitching on EMS arrival.  Head CT no acute changes, he was started on Dilantin  and given repeated doses of Ativan  due to continued focal seizure activity. As seizure activity increased, he was unable to move left arm at all, able to lift right arm without  drift. EEG showed LPDs on the right hemisphere, maximal right temporal region with some evolution in frequency but no definite seizure. Background showed diffuse slowing and lateralized right hemisphere slowing. MRI brain no acute changes, right MCA encephalomalacia with chronic blood products. Follow-up EEG showed right temporal sharp waves. He had an MRI brain on 08/22/21 for vision changes, MRI brain no change from prior. He had an extensive DVT and known right lower lobe lung mass which he declined further workup on.   He lives with his wife, his daughter has been staying with them since hospitalization, managing medications. She denies any further seizures since 08/13/21, no staring/unresponsive episodes. He feels his left leg is 90% better. He has glaucoma with blurred vision and bilateral loss of peripheral vision. He denies any olfactory/gustatory hallucinations, deja vu, rising epigastric sensation, focal tingling/weakness, myoclonic jerks. He denies any headaches, dizziness, neck/back pain, bowel/bladder dysfunction. He has been using a cane since hospital discharge, no falls. Sleep is good. No side effects on Levetiracetam  1000mg  BID. Mood is good, however his daughter reports he is "moody."   Epilepsy Risk Factors:  right MCA encephalomalacia. Otherwise he had a normal birth and early development.  There is no history of febrile convulsions, CNS infections such as meningitis/encephalitis, significant traumatic brain injury, neurosurgical procedures, or family history of seizures.  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     amLODipine  (NORVASC ) 10 MG tablet Take 1 tablet (10 mg total) by mouth daily. 90 tablet 3   atorvastatin  (LIPITOR) 40 MG tablet TAKE 1 TABLET BY MOUTH DAILY AT  6 PM 90 tablet 3   brimonidine  (ALPHAGAN ) 0.2 % ophthalmic solution Place 1 drop into both eyes 2 (two) times daily.     DIAZEPAM  PO Take 1 tablet by mouth as needed (seizures).     diazePAM , 15 MG Dose, (VALTOCO  15 MG DOSE) 2 x 7.5 MG/0.1ML LQPK Spray in each nostril as needed for seizure. May use second dose after 4 hours if needed. 5 each 5   dorzolamide  (TRUSOPT ) 2 % ophthalmic solution Place 1 drop into both eyes 2 (two) times daily.     ELIQUIS  5 MG TABS tablet TAKE 1 TABLET BY MOUTH TWICE A DAY 60 tablet 3   lacosamide  (VIMPAT ) 50 MG TABS tablet TAKE 1 TABLET BY MOUTH TWICE A DAY 180 tablet 3   latanoprost  (XALATAN ) 0.005 % ophthalmic solution Place 1 drop into both eyes at bedtime.     levETIRAcetam  (KEPPRA ) 1000 MG tablet Take 2 tablets (2,000 mg total) by mouth 2 (two) times daily. 360 tablet 3   metoprolol  tartrate (LOPRESSOR ) 25 MG tablet TAKE ONE-HALF TABLET BY MOUTH  TWICE DAILY 100 tablet 2   mirtazapine  (REMERON  SOL-TAB) 15 MG disintegrating tablet Take 1 tablet (15 mg total) by mouth at bedtime. 30 tablet 1   timolol  (BETIMOL ) 0.5 % ophthalmic solution Place 1 drop into both eyes 2 (two) times daily.     No current facility-administered medications on file prior to visit.    ALLERGIES: No Known Allergies  FAMILY HISTORY: Family History  Problem Relation Age of Onset   COPD Mother    Heart attack Father     SOCIAL HISTORY: Social History   Socioeconomic History   Marital status: Married    Spouse name: Travis Salazar   Number of  children: 4   Years of education: 10th grade   Highest education level: 9th grade  Occupational History   Occupation: retired  Tobacco Use   Smoking status: Former    Current packs/day: 0.00    Average packs/day: 1 pack/day for 50.0 years (50.0 ttl pk-yrs)    Types: Cigarettes    Start date: 12/16/1959    Quit date: 12/15/2009    Years since quitting: 14.3   Smokeless tobacco: Never   Tobacco comments:    already quit smoking  Vaping Use   Vaping status: Never Used  Substance and Sexual Activity   Alcohol use: No   Drug use: No   Sexual activity: Never  Other Topics Concern   Not on file  Social History Narrative   ** Merged History Encounter ** Right handed    Caffeine 2 cups daily   Home is one story   Social Drivers of Health   Financial Resource Strain: Low Risk  (  04/16/2024)   Overall Financial Resource Strain (CARDIA)    Difficulty of Paying Living Expenses: Not hard at all  Food Insecurity: No Food Insecurity (04/16/2024)   Hunger Vital Sign    Worried About Running Out of Food in the Last Year: Never true    Ran Out of Food in the Last Year: Never true  Transportation Needs: No Transportation Needs (04/16/2024)   PRAPARE - Administrator, Civil Service (Medical): No    Lack of Transportation (Non-Medical): No  Physical Activity: Inactive (04/16/2024)   Exercise Vital Sign    Days of Exercise per Week: 0 days    Minutes of Exercise per Session: 0 min  Stress: No Stress Concern Present (04/16/2024)   [employer] of Occupational Health - Occupational Stress Questionnaire    Feeling of Stress : Not at all  Social Connections: Unknown (04/16/2024)   Social Connection and Isolation Panel [NHANES]    Frequency of Communication with Friends and Family: More than three times a week    Frequency of Social Gatherings with Friends and Family: More than three times a week    Attends Religious Services: Patient declined    Database administrator or Organizations:  No    Attends Banker Meetings: Never    Marital Status: Widowed  Intimate Partner Violence: Not At Risk (04/19/2024)   Humiliation, Afraid, Rape, and Kick questionnaire    Fear of Current or Ex-Partner: No    Emotionally Abused: No    Physically Abused: No    Sexually Abused: No     PHYSICAL EXAM: Vitals:   04/20/24 1350  BP: 135/80  Pulse: 75  SpO2: 98%   General: No acute distress Head:  Normocephalic/atraumatic Skin/Extremities: No rash, no edema Neurological Exam: alert and awake. No aphasia or dysarthria. Fund of knowledge is appropriate. Attention and concentration are normal.   Cranial nerves: Pupils equal, round. Extraocular movements intact with no nystagmus. He sees hand movements on right eye and some finger counting on left eye. No facial asymmetry.  Motor: Bulk and tone normal, muscle strength 5/5 throughout with no pronator drift.   Finger to nose testing intact.  Gait slow and cautious, no ataxia.    IMPRESSION: This is a pleasant 88 yo RH man with a history of hypertension, hyperlipidemia, PE on Eliquis , prostate cancer, prior R MCA stroke, who had new onset focal status epilepticus last 08/13/21 with left face/arm twitching.  EEG showed LPDs arising from the right temporal region/right hemisphere. MRI no acute changes, right MCA encephalomalacia. Continue Eliquis  and control of vascular risk factors for secondary stroke prevention. No seizures or seizure-like symptoms since 03/2023, continue Levetiracetam  2000mg  BID and Lacosamide  50mg  BID. He is again noted to have vision loss today, worse on the left. Advised to start wearing his glasses, and if no improvement, follow-up with his eye doctor. He cannot drive due to vision impairment. Follow-up in 6-8 months, call for any changes.    Thank you for allowing me to participate in his care.  Please do not hesitate to call for any questions or concerns.   Rayfield Cairo, M.D.   CC: Dr. Branson Calandra

## 2024-05-04 ENCOUNTER — Other Ambulatory Visit: Payer: Self-pay | Admitting: Family Medicine

## 2024-05-04 DIAGNOSIS — I2699 Other pulmonary embolism without acute cor pulmonale: Secondary | ICD-10-CM

## 2024-05-12 ENCOUNTER — Other Ambulatory Visit: Payer: Self-pay | Admitting: Family Medicine

## 2024-05-12 DIAGNOSIS — I2699 Other pulmonary embolism without acute cor pulmonale: Secondary | ICD-10-CM

## 2024-05-12 MED ORDER — APIXABAN 5 MG PO TABS
5.0000 mg | ORAL_TABLET | Freq: Two times a day (BID) | ORAL | 3 refills | Status: DC
Start: 2024-05-12 — End: 2024-08-09

## 2024-05-12 NOTE — Telephone Encounter (Signed)
 Patient requesting refill at [facility]/pharmacy  ELIQUIS  5 MG TABS tablet  Please advise

## 2024-06-03 ENCOUNTER — Telehealth: Payer: Self-pay | Admitting: Neurology

## 2024-06-03 DIAGNOSIS — Z0279 Encounter for issue of other medical certificate: Secondary | ICD-10-CM

## 2024-06-03 DIAGNOSIS — G40201 Localization-related (focal) (partial) symptomatic epilepsy and epileptic syndromes with complex partial seizures, not intractable, with status epilepticus: Secondary | ICD-10-CM

## 2024-06-03 NOTE — Telephone Encounter (Signed)
 Received fax with FMLA paperwork for the pt's daughter/caregiver. The pt's daughter would like them faxed in once completed. She paid the $25 form fee. Forms are in Dr. Merita Staples box

## 2024-06-03 NOTE — Telephone Encounter (Signed)
 Pls confirm that there were no seizure triggers. If none, we can increase Vimpat  50mg : Take 1 tab in AM, 2 tabs in PM. It is hard to say with his vision loss and the falls, if it is related to that as well. Agree with cane at all times. Thanks

## 2024-06-03 NOTE — Telephone Encounter (Signed)
 Pt's daughter Everet Hitchcock called in and left a message returning Heather's call

## 2024-06-03 NOTE — Telephone Encounter (Signed)
 Pt daughter called she wanted to know if pt medication needed to be increase because he had one episode in May and she used the valtoco  no other episodes noted. He has had falls and stared to use his cane more, she was thinking that was seizure related. Pt daughter told that cane use was a good thing for safety to help prevent falls not to do with seizures. She was informed that I would let Dr Ty Gales know that I would send this to her and if any recommendations I would call her back.

## 2024-06-03 NOTE — Telephone Encounter (Signed)
 Pt/s daughter Travis Salazar would like convo on RX and treatment as willl as assistance being sole caregiver to father, please call back asap She will also be faxing Work Leave paper work for helping take care of father

## 2024-06-03 NOTE — Telephone Encounter (Signed)
 Pt called no answer left a voice mail to call the office back

## 2024-06-03 NOTE — Telephone Encounter (Signed)
 Pt.s daughter cld bk

## 2024-06-04 NOTE — Telephone Encounter (Signed)
 Pt's daughter Everet Hitchcock called in returning Heather's call

## 2024-06-04 NOTE — Telephone Encounter (Signed)
 Spoke with pt daughter she said that he said he was hot and sweaty and a little shaky before his episode , I did not tell her about increasing Vimpat  until telling you about triggers,

## 2024-06-07 NOTE — Telephone Encounter (Signed)
 Pls go ahead and increase Vimpat  50mg : Take 1 tab in AM, 2 tabs in PM.  For the FMLA form, she wrote down he needs continuous care. Pls confirm this is not for intermittent leave. For continuous care, it is asking me what are the beginning and ending dates, pls confirm, thanks

## 2024-06-08 NOTE — Telephone Encounter (Signed)
 Spoke with pt daughter they would like to do the increase in Vimpat ,  She said that the FMLA is for continuous care, dates are 06-11-2024 until 06-28-2024 I will call her and fax in paper work when its ready

## 2024-06-08 NOTE — Telephone Encounter (Signed)
 Pts daughter called back to let heather know the first day of leave is 06/14/24 and the return date is 06/28/24

## 2024-06-10 MED ORDER — LACOSAMIDE 50 MG PO TABS
ORAL_TABLET | ORAL | 3 refills | Status: DC
Start: 2024-06-10 — End: 2024-07-08

## 2024-06-10 NOTE — Telephone Encounter (Signed)
 See other phone note. Rx sent for updated Vimpat  dose

## 2024-06-10 NOTE — Telephone Encounter (Signed)
 Done, thanks

## 2024-06-11 NOTE — Telephone Encounter (Signed)
 Paperwork has been faxed for pt daughter

## 2024-06-29 ENCOUNTER — Encounter: Payer: Self-pay | Admitting: Family Medicine

## 2024-06-29 ENCOUNTER — Ambulatory Visit (INDEPENDENT_AMBULATORY_CARE_PROVIDER_SITE_OTHER): Admitting: Family Medicine

## 2024-06-29 VITALS — BP 118/69 | HR 71 | Ht 68.0 in | Wt 177.0 lb

## 2024-06-29 DIAGNOSIS — I471 Supraventricular tachycardia, unspecified: Secondary | ICD-10-CM | POA: Diagnosis not present

## 2024-06-29 DIAGNOSIS — Z130 Encounter for screening for diseases of the blood and blood-forming organs and certain disorders involving the immune mechanism: Secondary | ICD-10-CM

## 2024-06-29 DIAGNOSIS — Z9079 Acquired absence of other genital organ(s): Secondary | ICD-10-CM | POA: Diagnosis not present

## 2024-06-29 DIAGNOSIS — Z8673 Personal history of transient ischemic attack (TIA), and cerebral infarction without residual deficits: Secondary | ICD-10-CM | POA: Diagnosis not present

## 2024-06-29 DIAGNOSIS — Z0001 Encounter for general adult medical examination with abnormal findings: Secondary | ICD-10-CM | POA: Diagnosis not present

## 2024-06-29 DIAGNOSIS — Z131 Encounter for screening for diabetes mellitus: Secondary | ICD-10-CM | POA: Diagnosis not present

## 2024-06-29 DIAGNOSIS — J439 Emphysema, unspecified: Secondary | ICD-10-CM

## 2024-06-29 DIAGNOSIS — E7800 Pure hypercholesterolemia, unspecified: Secondary | ICD-10-CM

## 2024-06-29 DIAGNOSIS — G40909 Epilepsy, unspecified, not intractable, without status epilepticus: Secondary | ICD-10-CM | POA: Diagnosis not present

## 2024-06-29 DIAGNOSIS — I10 Essential (primary) hypertension: Secondary | ICD-10-CM

## 2024-06-29 DIAGNOSIS — Z0000 Encounter for general adult medical examination without abnormal findings: Secondary | ICD-10-CM

## 2024-06-29 NOTE — Patient Instructions (Signed)
 It was a pleasure to see you today!  Thank you for choosing [facility] for your primary care.   Today you were seen for your annual physical  Please review the attached information regarding helpful preventive health topics.   To keep you healthy, please keep in mind the following health maintenance items that you are due for:   Health Maintenance Due  Topic Date Due   DTaP/Tdap/Td (1 - Tdap) Never done   Zoster Vaccines- Shingrix (1 of 2) 10/16/1955   COVID-19 Vaccine (2 - Pfizer risk series) 01/08/2021   These vaccines are available at your pharmacy.   Best Wishes,   Dr. Lang

## 2024-06-29 NOTE — Progress Notes (Signed)
 Complete physical exam   Patient: Travis Salazar.   DOB: January 07, 1936   88 y.o. Male  MRN: [id number] Visit Date: 06/29/2024  Today's healthcare provider: Rockie Agent, MD   Chief Complaint  Patient presents with   Annual Exam   Care Management    Pattern of eating:General   Are you exercising:Yes  What type of exercising:walking  How long:15 to 20 mins  How frequent: 3 times a week  Vaccine: have to get them at the pharmacy      Subjective    Travis Salazar. is a 88 y.o. male who presents today for a complete physical exam.    He does not have additional problems to discuss today.   Discussed the use of AI scribe software for clinical note transcription with the patient, who gave verbal consent to proceed.  History of Present Illness An 88 year old male who presents for an annual physical exam.  He has a history of essential hypertension.  He has a history of coronary artery disease and hypercholesterolemia.  He has undergone a prostatectomy and has experienced a cerebrovascular accident in the past.  He has a history of emphysema and aortic stenosis.  He has a history of seizure disorder.  He is eating well and engaging in some exercise. No specific concerns or symptoms were expressed during the visit.     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)    History of prostate cancer    History of pulmonary embolus (PE)    Hyperlipidemia    Hypertension    Mass of upper lobe of right lung    Mild mitral stenosis by prior echocardiogram    Moderate to severe aortic stenosis    Primary open-angle glaucoma, bilateral, severe stage    Seizures (HCC)    Stroke [facility])    Past Surgical History:  Procedure Laterality Date   CATARACT EXTRACTION W/PHACO Right 07/16/2023   Procedure: CATARACT EXTRACTION PHACO AND INTRAOCULAR LENS  PLACEMENT (IOC) RIGHT MALYUGIN, OMIDRIA , VISION BLUE, KAHOOK DUAL BLADE GONIOTOMY 8.19 1.03.2;  Surgeon: Enola Feliciano Hugger, MD;  Location: [facility];  Service: Ophthalmology;  Laterality: Right;   CATARACT EXTRACTION W/PHACO Left 07/31/2023   Procedure: CATARACT EXTRACTION PHACO AND INTRAOCULAR LENS PLACEMENT (IOC) LEFT OMIDRIA  VISION BLUE KAHOOK DUAL BLADE GONIOTOMY  7.40  01:00.8;  Surgeon: Enola Feliciano Hugger, MD;  Location: [facility];  Service: Ophthalmology;  Laterality: Left;   LIPOMA EXCISION     neck   PROSTATECTOMY     due to prostate cancer   Social History   Socioeconomic History   Marital status: Married    Spouse name: [name]   Number of children: 4   Years of education: 10th grade   Highest education level: 9th grade  Occupational History   Occupation: retired  Tobacco Use   Smoking status: Former    Current packs/day: 0.00    Average packs/day: 1 pack/day for 50.0 years (50.0 ttl pk-yrs)    Types: Cigarettes    Start date: 12/16/1959    Quit date: 12/15/2009    Years since quitting: 14.5   Smokeless tobacco: Never   Tobacco comments:    already quit smoking  Vaping Use   Vaping status: Never Used  Substance and Sexual Activity   Alcohol use: No   Drug use:  No   Sexual activity: Never  Other Topics Concern   Not on file  Social History Narrative   ** Merged History Encounter ** Right handed    Caffeine 2 cups daily   Home is one story   Social Drivers of Health   Financial Resource Strain: Low Risk  (06/29/2024)   Overall Financial Resource Strain (CARDIA)    Difficulty of Paying Living Expenses: Not hard at all  Food Insecurity: No Food Insecurity (06/29/2024)   Hunger Vital Sign    Worried About Running Out of Food in the Last Year: Never true    Ran Out of Food in the Last Year: Never true  Transportation Needs: No Transportation Needs (06/29/2024)   PRAPARE - Administrator, Civil Service (Medical): No    Lack  of Transportation (Non-Medical): No  Physical Activity: Inactive (06/29/2024)   Exercise Vital Sign    Days of Exercise per Week: 0 days    Minutes of Exercise per Session: Not on file  Stress: No Stress Concern Present (06/29/2024)   [employer] of Occupational Health - Occupational Stress Questionnaire    Feeling of Stress: Not at all  Social Connections: Socially Isolated (06/29/2024)   Social Connection and Isolation Panel    Frequency of Communication with Friends and Family: More than three times a week    Frequency of Social Gatherings with Friends and Family: More than three times a week    Attends Religious Services: Never    Database administrator or Organizations: No    Attends Banker Meetings: Not on file    Marital Status: Widowed  Intimate Partner Violence: Not At Risk (04/19/2024)   Humiliation, Afraid, Rape, and Kick questionnaire    Fear of Current or Ex-Partner: No    Emotionally Abused: No    Physically Abused: No    Sexually Abused: No   Family Status  Relation Name Status   Mother  Deceased   Father  Deceased at age 21   Brother  Alive  No partnership data on file   Family History  Problem Relation Age of Onset   COPD Mother    Heart attack Father    No Known Allergies   Medications: Outpatient Medications Prior to Visit  Medication Sig   dorzolamide -timolol  (COSOPT) 2-0.5 % ophthalmic solution 1 drop 2 (two) times daily.   VYZULTA 0.024 % SOLN Apply 1 drop to eye at bedtime.   acetaminophen  (TYLENOL ) 500 MG tablet Take 500 mg by mouth daily as needed (back pain).   amLODipine  (NORVASC ) 10 MG tablet Take 1 tablet (10 mg total) by mouth daily.   apixaban  (ELIQUIS ) 5 MG TABS tablet Take 1 tablet (5 mg total) by mouth 2 (two) times daily.   atorvastatin  (LIPITOR) 40 MG tablet TAKE 1 TABLET BY MOUTH DAILY AT  6 PM   brimonidine  (ALPHAGAN ) 0.2 % ophthalmic solution Place 1 drop into both eyes 2 (two) times daily.   DIAZEPAM  PO Take 1  tablet by mouth as needed (seizures).   diazePAM , 15 MG Dose, (VALTOCO  15 MG DOSE) 2 x 7.5 MG/0.1ML LQPK Spray in each nostril as needed for seizure. May use second dose after 4 hours if needed.   dorzolamide  (TRUSOPT ) 2 % ophthalmic solution Place 1 drop into both eyes 2 (two) times daily.   lacosamide  (VIMPAT ) 50 MG TABS tablet Take 1 tablet in AM, 2 tablets in PM   latanoprost  (XALATAN ) 0.005 % ophthalmic solution Place 1 drop  into both eyes at bedtime.   levETIRAcetam  (KEPPRA ) 1000 MG tablet Take 2 tablets (2,000 mg total) by mouth 2 (two) times daily.   metoprolol  tartrate (LOPRESSOR ) 25 MG tablet TAKE ONE-HALF TABLET BY MOUTH  TWICE DAILY   mirtazapine  (REMERON  SOL-TAB) 15 MG disintegrating tablet Take 1 tablet (15 mg total) by mouth at bedtime.   timolol  (BETIMOL ) 0.5 % ophthalmic solution Place 1 drop into both eyes 2 (two) times daily.   No facility-administered medications prior to visit.    Review of Systems  Last CBC Lab Results  Component Value Date   WBC 6.8 04/11/2023   HGB 12.1 (L) 04/11/2023   HCT 36.0 (L) 04/11/2023   MCV 87.6 04/11/2023   MCH 29.4 04/11/2023   RDW 13.3 04/11/2023   PLT 125 (L) 04/11/2023   Last metabolic panel Lab Results  Component Value Date   GLUCOSE 110 (H) 04/11/2023   NA 138 04/11/2023   K 3.0 (L) 04/11/2023   CL 103 04/11/2023   CO2 25 04/11/2023   BUN 9 04/11/2023   CREATININE 0.87 04/11/2023   GFRNONAA >60 04/11/2023   CALCIUM  9.3 04/11/2023   PHOS 2.9 08/23/2021   PROT 6.1 (L) 04/10/2023   ALBUMIN 3.2 (L) 04/10/2023   LABGLOB 2.6 08/29/2020   AGRATIO 1.8 08/29/2020   BILITOT 0.4 04/10/2023   ALKPHOS 47 04/10/2023   AST 19 04/10/2023   ALT 16 04/10/2023   ANIONGAP 10 04/11/2023   Last lipids Lab Results  Component Value Date   CHOL 187 08/29/2020   HDL 40 08/29/2020   LDLCALC 125 (H) 08/29/2020   LDLDIRECT 89 02/05/2023   TRIG 89 08/14/2021   CHOLHDL 4.7 08/29/2020   Last hemoglobin A1c Lab Results  Component  Value Date   HGBA1C 5.9 (H) 08/29/2020   Last thyroid  functions Lab Results  Component Value Date   TSH 4.040 08/29/2020   Last vitamin D No results found for: 25OHVITD2, 25OHVITD3, VD25OH Last vitamin B12 and Folate No results found for: VITAMINB12, FOLATE     Objective    BP 118/69   Pulse 71   Ht 5' 8 (1.727 m)   Wt 177 lb (80.3 kg)   SpO2 100%   BMI 26.91 kg/m  BP Readings from Last 3 Encounters:  06/29/24 118/69  04/20/24 135/80  09/09/23 (!) 144/80   Wt Readings from Last 3 Encounters:  06/29/24 177 lb (80.3 kg)  04/20/24 182 lb (82.6 kg)  04/19/24 183 lb (83 kg)        Physical Exam Constitutional:      General: He is not in acute distress.    Appearance: Normal appearance. He is not ill-appearing.  HENT:     Mouth/Throat:     Mouth: Mucous membranes are moist.  Eyes:     General: No scleral icterus.       Right eye: No discharge.        Left eye: No discharge.     Conjunctiva/sclera: Conjunctivae normal.     Pupils: Pupils are equal, round, and reactive to light.     Comments: Vision is limited   Cardiovascular:     Rate and Rhythm: Normal rate and regular rhythm.     Heart sounds: Normal heart sounds.  Pulmonary:     Effort: Pulmonary effort is normal.     Breath sounds: Normal breath sounds.  Abdominal:     General: Bowel sounds are normal. There is no distension.     Palpations: Abdomen is soft.  Tenderness: There is no abdominal tenderness.  Musculoskeletal:        General: No deformity or signs of injury. Normal range of motion.     Cervical back: Normal range of motion and neck supple. No tenderness.     Right lower leg: No edema.     Left lower leg: No edema.  Lymphadenopathy:     Cervical: No cervical adenopathy.  Neurological:     Mental Status: He is alert and oriented to person, place, and time.     Cranial Nerves: No cranial nerve deficit.     Motor: No weakness.     Gait: Gait normal.     Comments: Pt is  seated in transport chair Demonstrates normal strength of bilateral lower extremities  Normal grip strength of right hand, 4/5 grip on left hand   Psychiatric:        Mood and Affect: Mood normal.        Behavior: Behavior normal.       Last depression screening scores    04/19/2024   11:39 AM 04/15/2023    1:42 PM 02/05/2023    1:29 PM  PHQ 2/9 Scores  PHQ - 2 Score 0 0 0  PHQ- 9 Score 0      Last fall risk screening    04/20/2024    1:57 PM  Fall Risk   Falls in the past year? 0  Number falls in past yr: 0  Injury with Fall? 0  Follow up Falls evaluation completed    Last Audit-C alcohol use screening    06/29/2024   12:57 PM  Alcohol Use Disorder Test (AUDIT)  1. How often do you have a drink containing alcohol? 0  3. How often do you have six or more drinks on one occasion? 0   A score of 3 or more in women, and 4 or more in men indicates increased risk for alcohol abuse, EXCEPT if all of the points are from question 1   No results found for any visits on 06/29/24.  Assessment & Plan    Routine Health Maintenance and Physical Exam  Immunization History  Administered Date(s) Administered   Influenza-Unspecified 10/16/2018   PFIZER(Purple Top)SARS-COV-2 Vaccination 12/18/2020   Pneumococcal Conjugate-13 10/04/2014   Pneumococcal Polysaccharide-23 01/11/2013   Zoster, Live 11/13/2012    Health Maintenance  Topic Date Due   DTaP/Tdap/Td (1 - Tdap) Never done   Zoster Vaccines- Shingrix (1 of 2) 10/16/1955   COVID-19 Vaccine (2 - Pfizer risk series) 01/08/2021   INFLUENZA VACCINE  07/16/2024   Medicare Annual Wellness (AWV)  04/19/2025   Pneumococcal Vaccine: 50+ Years  Completed   Hepatitis B Vaccines  Aged Out   HPV VACCINES  Aged Out   Meningococcal B Vaccine  Aged Out    Problem List Items Addressed This Visit       Cardiovascular and Mediastinum   Paroxysmal supraventricular tachycardia (HCC)   Essential hypertension   Relevant Orders    CMP14+EGFR     Respiratory   Emphysema lung (HCC)     Nervous and Auditory   Seizure disorder (HCC)     Other   S/P prostatectomy   Hypercholesterolemia   Relevant Orders   Lipid panel   History of CVA (cerebrovascular accident) (Chronic)   Other Visit Diagnoses       Annual physical exam    -  Primary     Screening for deficiency anemia       Relevant Orders  CBC     Screening for diabetes mellitus       Relevant Orders   Hemoglobin A1c       Assessment and Plan Assessment & Plan Annual Physical Examination Routine annual physical examination for an 88 year old male with essential hypertension, paroxysmal ventricular tachycardia, CAD, emphysema, aortic stenosis, seizure disorder, hypercholesterolemia, and a past CVA. No acute complaints or concerns. Blood pressure is well controlled. - Order CBC, cholesterol levels, electrolytes, kidney function, and blood glucose tests  General Health Maintenance Discussion on vaccinations including Shingrix, COVID, and tetanus. He received Zostavax in 2013. Shingrix is recommended for better protection against shingles, with a 90% effectiveness rate. Discussed potential flu-like symptoms post-Shingrix vaccination, advising to plan the timing of the vaccine to avoid conflicts with significant events. COVID and tetanus vaccines can be administered together if desired, as he has tolerated COVID vaccines well in the past. The decision to receive the COVID vaccine is left to his discretion, considering the variability in reactions among individuals. - recommend Shingrix vaccine separately at pharmacy, counseled on this vaccine benefits and risks - Recommend COVID and tetanus vaccines - Provide reminder sheet for vaccinations       Return in about 6 months (around 12/30/2024) for CHRONIC F/U.       Rockie Agent, MD  [facility] [phone] (phone) [phone] (fax)  [facility]

## 2024-06-30 LAB — LIPID PANEL

## 2024-07-01 ENCOUNTER — Ambulatory Visit: Payer: Self-pay | Admitting: Family Medicine

## 2024-07-01 LAB — CMP14+EGFR
ALT: 11 IU/L (ref 0–44)
AST: 20 IU/L (ref 0–40)
Albumin: 4 g/dL (ref 3.7–4.7)
Alkaline Phosphatase: 62 IU/L (ref 44–121)
BUN/Creatinine Ratio: 15 (ref 10–24)
BUN: 14 mg/dL (ref 8–27)
Bilirubin Total: 0.4 mg/dL (ref 0.0–1.2)
CO2: 21 mmol/L (ref 20–29)
Calcium: 9.9 mg/dL (ref 8.6–10.2)
Chloride: 104 mmol/L (ref 96–106)
Creatinine, Ser: 0.93 mg/dL (ref 0.76–1.27)
Globulin, Total: 2.5 g/dL (ref 1.5–4.5)
Glucose: 104 mg/dL — AB (ref 70–99)
Potassium: 4.6 mmol/L (ref 3.5–5.2)
Sodium: 141 mmol/L (ref 134–144)
Total Protein: 6.5 g/dL (ref 6.0–8.5)
eGFR: 80 mL/min/1.73 (ref >59)

## 2024-07-01 LAB — LIPID PANEL
Cholesterol, Total: 150 mg/dL (ref 100–199)
HDL: 37 mg/dL — AB (ref >39)
LDL CALC COMMENT:: 4.1 ratio (ref 0.0–5.0)
LDL Chol Calc (NIH): 87 mg/dL (ref 0–99)
Triglycerides: 147 mg/dL (ref 0–149)
VLDL Cholesterol Cal: 26 mg/dL (ref 5–40)

## 2024-07-01 LAB — CBC
Hematocrit: 36 % — ABNORMAL LOW (ref 37.5–51.0)
Hemoglobin: 11.5 g/dL — ABNORMAL LOW (ref 13.0–17.7)
MCH: 29.1 pg (ref 26.6–33.0)
MCHC: 31.9 g/dL (ref 31.5–35.7)
MCV: 91 fL (ref 79–97)
Platelets: 149 x10E3/uL — ABNORMAL LOW (ref 150–450)
RBC: 3.95 x10E6/uL — ABNORMAL LOW (ref 4.14–5.80)
RDW: 13.4 % (ref 11.6–15.4)
WBC: 6.7 x10E3/uL (ref 3.4–10.8)

## 2024-07-01 LAB — HEMOGLOBIN A1C
Est. average glucose Bld gHb Est-mCnc: 126 mg/dL
Hgb A1c MFr Bld: 6 % — ABNORMAL HIGH (ref 4.8–5.6)

## 2024-07-08 ENCOUNTER — Other Ambulatory Visit: Payer: Self-pay | Admitting: Neurology

## 2024-07-08 DIAGNOSIS — G40201 Localization-related (focal) (partial) symptomatic epilepsy and epileptic syndromes with complex partial seizures, not intractable, with status epilepticus: Secondary | ICD-10-CM

## 2024-07-08 MED ORDER — LACOSAMIDE 50 MG PO TABS
ORAL_TABLET | ORAL | 3 refills | Status: DC
Start: 2024-07-08 — End: 2024-10-05

## 2024-07-08 MED ORDER — LACOSAMIDE 100 MG PO TABS: ORAL_TABLET | ORAL | 3 refills | Status: DC

## 2024-07-08 NOTE — Progress Notes (Signed)
 Spoke to pr daughter (Travis Salazar)--sent 2 separate Rx for Lacosamide  50mg : Take 1 tablet every morning, and a separate prescription for Lacosamide  100mg : Take 1 tablet every night. Pt voiced understanding and repeat the meds instruction.

## 2024-07-08 NOTE — Progress Notes (Signed)
 Pls let daughter know that pharmacy/insurance will not do more than 2 tablets a day of a prescription. So I sent in a prescription for Lacosamide  50mg : Take 1 tablet every morning, and a separate prescription for Lacosamide  100mg : Take 1 tablet every night. Pls make sure they understand instructions, thanks

## 2024-07-08 NOTE — Progress Notes (Signed)
 Routed to Dr Georjean

## 2024-07-20 ENCOUNTER — Other Ambulatory Visit (HOSPITAL_COMMUNITY): Payer: Self-pay | Admitting: Urology

## 2024-07-20 DIAGNOSIS — C7951 Secondary malignant neoplasm of bone: Secondary | ICD-10-CM

## 2024-07-20 DIAGNOSIS — C61 Malignant neoplasm of prostate: Secondary | ICD-10-CM

## 2024-07-21 ENCOUNTER — Other Ambulatory Visit: Payer: Self-pay | Admitting: Family Medicine

## 2024-07-21 DIAGNOSIS — E7800 Pure hypercholesterolemia, unspecified: Secondary | ICD-10-CM

## 2024-07-21 NOTE — Telephone Encounter (Signed)
 Year supply given in January 2025

## 2024-08-09 ENCOUNTER — Other Ambulatory Visit: Payer: Self-pay | Admitting: Family Medicine

## 2024-08-09 DIAGNOSIS — I2699 Other pulmonary embolism without acute cor pulmonale: Secondary | ICD-10-CM

## 2024-08-09 NOTE — Telephone Encounter (Unsigned)
 Copied from CRM #[id number]. Topic: Clinical - Medication Refill >> Aug 09, 2024 12:22 PM Nathanel BROCKS wrote: Pt is requesting a 90 day supply  Medication: apixaban  (ELIQUIS ) 5 MG TABS tablet  Has the patient contacted their pharmacy? Yes   This is the patient's preferred pharmacy:  [facility] [phone] [facility], [place] - [address] [place] [address] Phone: [phone] Fax: [phone]  Is this the correct pharmacy for this prescription? Yes If no, delete pharmacy and type the correct one.   Has the prescription been filled recently? Yes  Is the patient out of the medication? Yes  Has the patient been seen for an appointment in the last year OR does the patient have an upcoming appointment? Yes  Can we respond through MyChart? No  Agent: Please be advised that Rx refills may take up to 3 business days. We ask that you follow-up with your pharmacy.

## 2024-08-09 NOTE — Telephone Encounter (Signed)
 Requesting 90 days.

## 2024-08-10 MED ORDER — APIXABAN 5 MG PO TABS: 5.0000 mg | ORAL_TABLET | Freq: Two times a day (BID) | ORAL | 3 refills | Status: DC

## 2024-08-24 ENCOUNTER — Ambulatory Visit (HOSPITAL_COMMUNITY)
Admission: RE | Admit: 2024-08-24 | Discharge: 2024-08-24 | Disposition: A | Discharge status: Home / Self Care | Source: Ambulatory Visit | Attending: Urology | Admitting: Urology

## 2024-08-24 DIAGNOSIS — K769 Liver disease, unspecified: Secondary | ICD-10-CM | POA: Diagnosis not present

## 2024-08-24 DIAGNOSIS — C7951 Secondary malignant neoplasm of bone: Secondary | ICD-10-CM | POA: Insufficient documentation

## 2024-08-24 DIAGNOSIS — R932 Abnormal findings on diagnostic imaging of liver and biliary tract: Secondary | ICD-10-CM | POA: Diagnosis not present

## 2024-08-24 DIAGNOSIS — C61 Malignant neoplasm of prostate: Secondary | ICD-10-CM | POA: Insufficient documentation

## 2024-08-24 MED ORDER — TECHNETIUM TC 99M MEDRONATE IV KIT
20.0000 | PACK | Freq: Once | INTRAVENOUS | Status: AC | PRN
  Administered 2024-08-24: 18.6 via INTRAVENOUS

## 2024-08-24 MED ORDER — IOHEXOL 300 MG/ML  SOLN
100.0000 mL | Freq: Once | INTRAMUSCULAR | Status: AC | PRN
  Administered 2024-08-24: 100 mL via INTRAVENOUS

## 2024-08-30 ENCOUNTER — Emergency Department (HOSPITAL_COMMUNITY)
Admission: EM | Admit: 2024-08-30 | Discharge: 2024-08-30 | Disposition: A | Discharge status: Home / Self Care | Attending: Emergency Medicine | Admitting: Emergency Medicine

## 2024-08-30 ENCOUNTER — Emergency Department (HOSPITAL_COMMUNITY)

## 2024-08-30 DIAGNOSIS — R531 Weakness: Secondary | ICD-10-CM | POA: Insufficient documentation

## 2024-08-30 DIAGNOSIS — R5383 Other fatigue: Secondary | ICD-10-CM | POA: Diagnosis not present

## 2024-08-30 DIAGNOSIS — R0602 Shortness of breath: Secondary | ICD-10-CM | POA: Insufficient documentation

## 2024-08-30 DIAGNOSIS — Z7901 Long term (current) use of anticoagulants: Secondary | ICD-10-CM | POA: Insufficient documentation

## 2024-08-30 DIAGNOSIS — Z743 Need for continuous supervision: Secondary | ICD-10-CM | POA: Diagnosis not present

## 2024-08-30 DIAGNOSIS — E860 Dehydration: Secondary | ICD-10-CM | POA: Insufficient documentation

## 2024-08-30 DIAGNOSIS — R059 Cough, unspecified: Secondary | ICD-10-CM | POA: Diagnosis not present

## 2024-08-30 DIAGNOSIS — N390 Urinary tract infection, site not specified: Secondary | ICD-10-CM | POA: Diagnosis not present

## 2024-08-30 DIAGNOSIS — Z8546 Personal history of malignant neoplasm of prostate: Secondary | ICD-10-CM | POA: Diagnosis not present

## 2024-08-30 DIAGNOSIS — R609 Edema, unspecified: Secondary | ICD-10-CM | POA: Diagnosis not present

## 2024-08-30 LAB — COMPREHENSIVE METABOLIC PANEL WITH GFR
ALT: 9 U/L (ref 0–44)
AST: 24 U/L (ref 15–41)
Albumin: 3.6 g/dL (ref 3.5–5.0)
Alkaline Phosphatase: 58 U/L (ref 38–126)
Anion gap: 12 (ref 5–15)
BUN: 15 mg/dL (ref 8–23)
CO2: 22 mmol/L (ref 22–32)
Calcium: 9.7 mg/dL (ref 8.9–10.3)
Chloride: 102 mmol/L (ref 98–111)
Creatinine, Ser: 0.8 mg/dL (ref 0.61–1.24)
GFR, Estimated: >60 mL/min (ref >60)
Glucose, Bld: 97 mg/dL (ref 70–99)
Potassium: 3.9 mmol/L (ref 3.5–5.1)
Sodium: 136 mmol/L (ref 135–145)
Total Bilirubin: 0.8 mg/dL (ref 0.0–1.2)
Total Protein: 6.4 g/dL — ABNORMAL LOW (ref 6.5–8.1)

## 2024-08-30 LAB — URINALYSIS, ROUTINE W REFLEX MICROSCOPIC
Bilirubin Urine: NEGATIVE
Glucose, UA: NEGATIVE mg/dL
Hgb urine dipstick: NEGATIVE
Ketones, ur: 5 mg/dL — AB
Nitrite: POSITIVE — AB
Protein, ur: NEGATIVE mg/dL
Specific Gravity, Urine: 1.008 (ref 1.005–1.030)
pH: 6 (ref 5.0–8.0)

## 2024-08-30 LAB — CBC WITH DIFFERENTIAL/PLATELET
Abs Immature Granulocytes: 0.04 K/uL (ref 0.00–0.07)
Basophils Absolute: 0 K/uL (ref 0.0–0.1)
Basophils Relative: 1 %
Eosinophils Absolute: 0.1 K/uL (ref 0.0–0.5)
Eosinophils Relative: 1 %
HCT: 35.8 % — ABNORMAL LOW (ref 39.0–52.0)
Hemoglobin: 11.1 g/dL — ABNORMAL LOW (ref 13.0–17.0)
Immature Granulocytes: 1 %
Lymphocytes Relative: 18 %
Lymphs Abs: 1.2 K/uL (ref 0.7–4.0)
MCH: 28.4 pg (ref 26.0–34.0)
MCHC: 31 g/dL (ref 30.0–36.0)
MCV: 91.6 fL (ref 80.0–100.0)
Monocytes Absolute: 0.4 K/uL (ref 0.1–1.0)
Monocytes Relative: 6 %
Neutro Abs: 4.9 K/uL (ref 1.7–7.7)
Neutrophils Relative %: 73 %
Platelets: 162 K/uL (ref 150–400)
RBC: 3.91 MIL/uL — ABNORMAL LOW (ref 4.22–5.81)
RDW: 13.6 % (ref 11.5–15.5)
WBC: 6.6 K/uL (ref 4.0–10.5)
nRBC: 0 % (ref 0.0–0.2)

## 2024-08-30 LAB — RESP PANEL BY RT-PCR (RSV, FLU A&B, COVID)  RVPGX2
Influenza A by PCR: NEGATIVE
Influenza B by PCR: NEGATIVE
Resp Syncytial Virus by PCR: NEGATIVE
SARS Coronavirus 2 by RT PCR: NEGATIVE

## 2024-08-30 MED ORDER — CEPHALEXIN 500 MG PO CAPS: 500.0000 mg | ORAL_CAPSULE | Freq: Three times a day (TID) | ORAL | 0 refills | Status: DC

## 2024-08-30 MED ORDER — CEPHALEXIN 500 MG PO CAPS
500.0000 mg | ORAL_CAPSULE | Freq: Once | ORAL | Status: DC
Start: 2024-08-30 — End: 2024-08-31

## 2024-08-30 MED ORDER — SODIUM CHLORIDE 0.9 % IV BOLUS
1000.0000 mL | Freq: Once | INTRAVENOUS | Status: AC
  Administered 2024-08-30: 1000 mL via INTRAVENOUS

## 2024-08-30 NOTE — ED Triage Notes (Signed)
 BIBA from home for malaise, has been around a family member that is Covid positive. Hx of dementia, visual impairment. 152/88 bp 80 hr 96% r/a 117 cbg

## 2024-08-30 NOTE — ED Provider Notes (Addendum)
 [place] EMERGENCY DEPARTMENT AT [facility] Provider Note   CSN: [id number] Arrival date & time: 08/30/24  1510     Patient presents with: Fatigue   Travis Salazar. is a 88 y.o. male.   Pt is a 88 yo male with pmhx significant for HLD, CVA, seizures, PE (on Eliquis ), glaucoma, prostate cancer with mets, and hx RUL mass.  Pt said he felt very weak today.  He came back from the bathroom and felt very sob walking across the room and laid down on the floor.  Pt's youngest daughter was with him on Tuesday for a doctor's appt and then tested + for Covid.  Pt has not had a fever.         Prior to Admission medications   Medication Sig Start Date End Date Taking? Authorizing Provider  cephALEXin  (KEFLEX ) 500 MG capsule Take 1 capsule (500 mg total) by mouth 3 (three) times daily. 08/30/24  Yes Dean Clarity, MD  acetaminophen  (TYLENOL ) 500 MG tablet Take 500 mg by mouth daily as needed (back pain).    [provider]  amLODipine  (NORVASC ) 10 MG tablet Take 1 tablet (10 mg total) by mouth daily. 02/19/24   Simmons-Robinson, Rockie, MD  apixaban  (ELIQUIS ) 5 MG TABS tablet Take 1 tablet (5 mg total) by mouth 2 (two) times daily. 08/10/24   Simmons-Robinson, Makiera, MD  atorvastatin  (LIPITOR) 40 MG tablet TAKE 1 TABLET BY MOUTH DAILY AT  6 PM 12/23/23   Simmons-Robinson, Makiera, MD  brimonidine  (ALPHAGAN ) 0.2 % ophthalmic solution Place 1 drop into both eyes 2 (two) times daily.    [provider]  DIAZEPAM  PO Take 1 tablet by mouth as needed (seizures).    [provider]  diazePAM , 15 MG Dose, (VALTOCO  15 MG DOSE) 2 x 7.5 MG/0.1ML LQPK Spray in each nostril as needed for seizure. May use second dose after 4 hours if needed. 04/02/22   Georjean Darice HERO, MD  dorzolamide  (TRUSOPT ) 2 % ophthalmic solution Place 1 drop into both eyes 2 (two) times daily.    [provider]  dorzolamide -timolol  (COSOPT) 2-0.5 % ophthalmic solution 1 drop 2 (two) times  daily. 06/05/24   [provider]  lacosamide  (VIMPAT ) 50 MG TABS tablet Take 1 tablet every morning 07/08/24   Aquino, Karen M, MD  Lacosamide  100 MG TABS Take 1 tablet every night 07/08/24   Georjean Darice HERO, MD  latanoprost  (XALATAN ) 0.005 % ophthalmic solution Place 1 drop into both eyes at bedtime.    [provider]  levETIRAcetam  (KEPPRA ) 1000 MG tablet Take 2 tablets (2,000 mg total) by mouth 2 (two) times daily. 04/20/24   Georjean Darice HERO, MD  metoprolol  tartrate (LOPRESSOR ) 25 MG tablet TAKE ONE-HALF TABLET BY MOUTH  TWICE DAILY 12/30/23   Gasper Nancyann BRAVO, MD  mirtazapine  (REMERON  SOL-TAB) 15 MG disintegrating tablet Take 1 tablet (15 mg total) by mouth at bedtime. 02/07/23   Simmons-Robinson, Rockie, MD  timolol  (BETIMOL ) 0.5 % ophthalmic solution Place 1 drop into both eyes 2 (two) times daily.    [provider]  VYZULTA 0.024 % SOLN Apply 1 drop to eye at bedtime. 04/22/24   [provider]    Allergies: Patient has no known allergies.    Review of Systems  Constitutional:  Positive for fatigue.  Respiratory:  Positive for shortness of breath.   All other systems reviewed and are negative.   Updated Vital Signs BP (!) 153/77   Pulse 76  Temp 98.5 F (36.9 C)   Resp 15   SpO2 100%   Physical Exam Vitals and nursing note reviewed.  Constitutional:      Appearance: Normal appearance.  HENT:     Head: Normocephalic and atraumatic.     Right Ear: External ear normal.     Left Ear: External ear normal.     Nose: Nose normal.     Mouth/Throat:     Mouth: Mucous membranes are moist.     Pharynx: Oropharynx is clear.  Eyes:     Extraocular Movements: Extraocular movements intact.     Conjunctiva/sclera: Conjunctivae normal.  Cardiovascular:     Rate and Rhythm: Normal rate and regular rhythm.     Pulses: Normal pulses.     Heart sounds: Normal heart sounds.  Pulmonary:     Effort: Pulmonary effort is normal.     Breath sounds: Normal  breath sounds.  Abdominal:     General: Abdomen is flat. Bowel sounds are normal.     Palpations: Abdomen is soft.  Musculoskeletal:        General: Normal range of motion.     Cervical back: Normal range of motion and neck supple.  Skin:    General: Skin is warm.     Capillary Refill: Capillary refill takes less than 2 seconds.  Neurological:     General: No focal deficit present.     Mental Status: He is alert and oriented to person, place, and time.  Psychiatric:        Mood and Affect: Mood normal.        Behavior: Behavior normal.        Thought Content: Thought content normal.        Judgment: Judgment normal.     (all labs ordered are listed, but only abnormal results are displayed) Labs Reviewed  CBC WITH DIFFERENTIAL/PLATELET - Abnormal; Notable for the following components:      Result Value   RBC 3.91 (*)    Hemoglobin 11.1 (*)    HCT 35.8 (*)    All other components within normal limits  COMPREHENSIVE METABOLIC PANEL WITH GFR - Abnormal; Notable for the following components:   Total Protein 6.4 (*)    All other components within normal limits  URINALYSIS, ROUTINE W REFLEX MICROSCOPIC - Abnormal; Notable for the following components:   APPearance HAZY (*)    Ketones, ur 5 (*)    Nitrite POSITIVE (*)    Leukocytes,Ua TRACE (*)    Bacteria, UA MANY (*)    All other components within normal limits  RESP PANEL BY RT-PCR (RSV, FLU A&B, COVID)  [id number]  CBG MONITORING, ED    EKG: EKG Interpretation Date/Time:  Monday August 30 2024 15:48:48 EDT Ventricular Rate:  75 PR Interval:  323 QRS Duration:  89 QT Interval:  396 QTC Calculation: 443 R Axis:   40  Text Interpretation: Sinus rhythm Prolonged PR interval No significant change since last tracing Confirmed by Dean Clarity [phone]) on 08/30/2024 4:25:24 PM  Radiology: DG Chest Portable 1 View Result Date: 08/30/2024 CLINICAL DATA:  Shortness of breath. EXAM: PORTABLE CHEST 1 VIEW COMPARISON:   04/10/2023. FINDINGS: Bilateral lung fields are clear. Bilateral costophrenic angles are clear. Normal cardio-mediastinal silhouette. No acute osseous abnormalities. The soft tissues are within normal limits. IMPRESSION: No active disease. Electronically Signed   By: Ree Molt M.D.   On: 08/30/2024 16:30     Procedures   Medications Ordered in the ED  cephALEXin  (KEFLEX ) capsule 500 mg (has no administration in time range)  sodium chloride  0.9 % bolus 1,000 mL (0 mLs Intravenous Stopped 08/30/24 1849)                                    Medical Decision Making Amount and/or Complexity of Data Reviewed Labs: ordered. Radiology: ordered.  Risk Prescription drug management.   This patient presents to the ED for concern of sob, this involves an extensive number of treatment options, and is a complaint that carries with it a high risk of complications and morbidity.  The differential diagnosis includes covid/flu/rsv, pna, anemia, electrolyte abn   Co morbidities that complicate the patient evaluation  HLD, CVA, seizures, PE (on Eliquis ), glaucoma, prostate cancer with mets, and hx RUL mass   Additional history obtained:  Additional history obtained from epic chart review External records from outside source obtained and reviewed including EMS report   Lab Tests:  I Ordered, and personally interpreted labs.  The pertinent results include:  cbc with hgb 11.1 (stable); cmp neg; covid/flu/rsv neg; UA + UTI   Imaging Studies ordered:  I ordered imaging studies including cxr  I independently visualized and interpreted imaging which showed No active disease.  I agree with the radiologist interpretation   Cardiac Monitoring:  The patient was maintained on a cardiac monitor.  I personally viewed and interpreted the cardiac monitored which showed an underlying rhythm of: nsr   Medicines ordered and prescription drug management:  I ordered medication including ivfs  for sx   Reevaluation of the patient after these medicines showed that the patient improved I have reviewed the patients home medicines and have made adjustments as needed   Test Considered:  ct   Critical Interventions:  ivfs   Problem List / ED Course:  Fatigue:  pt feels much better after IVFs.  He said he has not had anything to eat all day. He is able to ambulate without any difficulty or sob.  He is stable for d/c.  Return if worse.  F/u with pcp. UTI:  pt started on keflex    Reevaluation:  After the interventions noted above, I reevaluated the patient and found that they have :improved   Social Determinants of Health:  Lives at home   Dispostion:  After consideration of the diagnostic results and the patients response to treatment, I feel that the patent would benefit from discharge with outpatient f/u.       Final diagnoses:  Dehydration  Weakness  Lower urinary tract infectious disease    ED Discharge Orders          Ordered    cephALEXin  (KEFLEX ) 500 MG capsule  3 times daily        08/30/24 2017               Dean Clarity, MD 08/30/24 RUDEAN    Dean Clarity, MD 08/30/24 2019

## 2024-08-30 NOTE — ED Notes (Signed)
 Pt sitting on the side of the bed. Pt advised one of his daughters will be picking him up. Contacted [place] and was advised her sister would pick him up on the way from [place].

## 2024-09-08 ENCOUNTER — Observation Stay (HOSPITAL_COMMUNITY)
Admission: EM | Admit: 2024-09-08 | Discharge: 2024-09-11 | Disposition: A | Discharge status: Home Health | Attending: Internal Medicine | Admitting: Internal Medicine

## 2024-09-08 ENCOUNTER — Encounter (HOSPITAL_COMMUNITY): Payer: Self-pay | Admitting: Pharmacy Technician

## 2024-09-08 ENCOUNTER — Other Ambulatory Visit: Payer: Self-pay

## 2024-09-08 DIAGNOSIS — H409 Unspecified glaucoma: Secondary | ICD-10-CM | POA: Diagnosis not present

## 2024-09-08 DIAGNOSIS — I10 Essential (primary) hypertension: Secondary | ICD-10-CM | POA: Diagnosis not present

## 2024-09-08 DIAGNOSIS — Z8546 Personal history of malignant neoplasm of prostate: Secondary | ICD-10-CM | POA: Diagnosis not present

## 2024-09-08 DIAGNOSIS — K922 Gastrointestinal hemorrhage, unspecified: Secondary | ICD-10-CM | POA: Diagnosis present

## 2024-09-08 DIAGNOSIS — J439 Emphysema, unspecified: Secondary | ICD-10-CM | POA: Insufficient documentation

## 2024-09-08 DIAGNOSIS — I82409 Acute embolism and thrombosis of unspecified deep veins of unspecified lower extremity: Secondary | ICD-10-CM | POA: Insufficient documentation

## 2024-09-08 DIAGNOSIS — Z79899 Other long term (current) drug therapy: Secondary | ICD-10-CM | POA: Diagnosis not present

## 2024-09-08 DIAGNOSIS — Z8673 Personal history of transient ischemic attack (TIA), and cerebral infarction without residual deficits: Secondary | ICD-10-CM | POA: Diagnosis not present

## 2024-09-08 DIAGNOSIS — E876 Hypokalemia: Secondary | ICD-10-CM | POA: Insufficient documentation

## 2024-09-08 DIAGNOSIS — D72829 Elevated white blood cell count, unspecified: Secondary | ICD-10-CM | POA: Insufficient documentation

## 2024-09-08 DIAGNOSIS — Z9079 Acquired absence of other genital organ(s): Secondary | ICD-10-CM | POA: Diagnosis not present

## 2024-09-08 DIAGNOSIS — K625 Hemorrhage of anus and rectum: Secondary | ICD-10-CM | POA: Diagnosis present

## 2024-09-08 DIAGNOSIS — Z87891 Personal history of nicotine dependence: Secondary | ICD-10-CM | POA: Diagnosis not present

## 2024-09-08 DIAGNOSIS — E785 Hyperlipidemia, unspecified: Secondary | ICD-10-CM | POA: Diagnosis not present

## 2024-09-08 DIAGNOSIS — M1A09X0 Idiopathic chronic gout, multiple sites, without tophus (tophi): Secondary | ICD-10-CM | POA: Diagnosis not present

## 2024-09-08 DIAGNOSIS — I2510 Atherosclerotic heart disease of native coronary artery without angina pectoris: Secondary | ICD-10-CM | POA: Diagnosis not present

## 2024-09-08 DIAGNOSIS — G40909 Epilepsy, unspecified, not intractable, without status epilepticus: Secondary | ICD-10-CM | POA: Diagnosis not present

## 2024-09-08 DIAGNOSIS — A0472 Enterocolitis due to Clostridium difficile, not specified as recurrent: Secondary | ICD-10-CM | POA: Diagnosis not present

## 2024-09-08 DIAGNOSIS — R918 Other nonspecific abnormal finding of lung field: Secondary | ICD-10-CM | POA: Insufficient documentation

## 2024-09-08 DIAGNOSIS — R531 Weakness: Secondary | ICD-10-CM | POA: Diagnosis not present

## 2024-09-08 DIAGNOSIS — I471 Supraventricular tachycardia, unspecified: Secondary | ICD-10-CM | POA: Diagnosis not present

## 2024-09-08 DIAGNOSIS — Z7901 Long term (current) use of anticoagulants: Secondary | ICD-10-CM | POA: Diagnosis not present

## 2024-09-08 LAB — COMPREHENSIVE METABOLIC PANEL WITH GFR
ALT: 10 U/L (ref 0–44)
AST: 21 U/L (ref 15–41)
Albumin: 3.1 g/dL — ABNORMAL LOW (ref 3.5–5.0)
Alkaline Phosphatase: 50 U/L (ref 38–126)
Anion gap: 12 (ref 5–15)
BUN: 15 mg/dL (ref 8–23)
CO2: 22 mmol/L (ref 22–32)
Calcium: 8.9 mg/dL (ref 8.9–10.3)
Chloride: 103 mmol/L (ref 98–111)
Creatinine, Ser: 1.06 mg/dL (ref 0.61–1.24)
GFR, Estimated: >60 mL/min (ref >60)
Glucose, Bld: 123 mg/dL — ABNORMAL HIGH (ref 70–99)
Potassium: 3.1 mmol/L — ABNORMAL LOW (ref 3.5–5.1)
Sodium: 137 mmol/L (ref 135–145)
Total Bilirubin: 1.1 mg/dL (ref 0.0–1.2)
Total Protein: 6.1 g/dL — ABNORMAL LOW (ref 6.5–8.1)

## 2024-09-08 LAB — CBC
HCT: 33.9 % — ABNORMAL LOW (ref 39.0–52.0)
Hemoglobin: 10.7 g/dL — ABNORMAL LOW (ref 13.0–17.0)
MCH: 29.3 pg (ref 26.0–34.0)
MCHC: 31.6 g/dL (ref 30.0–36.0)
MCV: 92.9 fL (ref 80.0–100.0)
Platelets: 164 K/uL (ref 150–400)
RBC: 3.65 MIL/uL — ABNORMAL LOW (ref 4.22–5.81)
RDW: 13.9 % (ref 11.5–15.5)
WBC: 17.3 K/uL — ABNORMAL HIGH (ref 4.0–10.5)
nRBC: 0 % (ref 0.0–0.2)

## 2024-09-08 LAB — CBG MONITORING, ED: Glucose-Capillary: 112 mg/dL — ABNORMAL HIGH (ref 70–99)

## 2024-09-08 LAB — TYPE AND SCREEN
ABO/RH(D): A POS
Antibody Screen: NEGATIVE

## 2024-09-08 LAB — MAGNESIUM: Magnesium: 1.8 mg/dL (ref 1.7–2.4)

## 2024-09-08 MED ORDER — ACETAMINOPHEN 325 MG PO TABS: 650.0000 mg | ORAL_TABLET | Freq: Four times a day (QID) | ORAL | Status: DC | PRN

## 2024-09-08 MED ORDER — SODIUM CHLORIDE 0.9% FLUSH
3.0000 mL | Freq: Two times a day (BID) | INTRAVENOUS | Status: DC
  Administered 2024-09-08 – 2024-09-11 (×5): 3 mL via INTRAVENOUS

## 2024-09-08 MED ORDER — DORZOLAMIDE HCL-TIMOLOL MAL 2-0.5 % OP SOLN
1.0000 [drp] | Freq: Two times a day (BID) | OPHTHALMIC | Status: DC
  Administered 2024-09-08 – 2024-09-11 (×6): 1 [drp] via OPHTHALMIC
  Filled 2024-09-08: qty 10

## 2024-09-08 MED ORDER — BRIMONIDINE TARTRATE 0.2 % OP SOLN
1.0000 [drp] | Freq: Two times a day (BID) | OPHTHALMIC | Status: DC
  Administered 2024-09-08 – 2024-09-11 (×6): 1 [drp] via OPHTHALMIC
  Filled 2024-09-08: qty 5

## 2024-09-08 MED ORDER — LACOSAMIDE 50 MG PO TABS
50.0000 mg | ORAL_TABLET | Freq: Every day | ORAL | Status: DC
  Administered 2024-09-09 – 2024-09-11 (×3): 50 mg via ORAL
  Filled 2024-09-08 (×4): qty 1

## 2024-09-08 MED ORDER — ACETAMINOPHEN 650 MG RE SUPP: 650.0000 mg | Freq: Four times a day (QID) | RECTAL | Status: DC | PRN

## 2024-09-08 MED ORDER — POTASSIUM CHLORIDE 20 MEQ PO PACK
40.0000 meq | PACK | Freq: Once | ORAL | Status: AC
Start: 2024-09-08 — End: 2024-09-08
  Administered 2024-09-08: 40 meq via ORAL
  Filled 2024-09-08: qty 2

## 2024-09-08 MED ORDER — SODIUM CHLORIDE 0.9 % IV BOLUS
1000.0000 mL | Freq: Once | INTRAVENOUS | Status: AC
  Administered 2024-09-08: 1000 mL via INTRAVENOUS

## 2024-09-08 MED ORDER — POLYETHYLENE GLYCOL 3350 17 G PO PACK: 17.0000 g | PACK | Freq: Every day | ORAL | Status: DC | PRN

## 2024-09-08 MED ORDER — POTASSIUM CHLORIDE CRYS ER 20 MEQ PO TBCR: 40.0000 meq | EXTENDED_RELEASE_TABLET | Freq: Once | ORAL | Status: DC

## 2024-09-08 MED ORDER — LACOSAMIDE 50 MG PO TABS
100.0000 mg | ORAL_TABLET | Freq: Every day | ORAL | Status: DC
  Administered 2024-09-08 – 2024-09-10 (×3): 100 mg via ORAL
  Filled 2024-09-08 (×3): qty 2

## 2024-09-08 MED ORDER — METOPROLOL TARTRATE 12.5 MG HALF TABLET
12.5000 mg | ORAL_TABLET | Freq: Two times a day (BID) | ORAL | Status: DC
Start: 2024-09-08 — End: 2024-09-11
  Administered 2024-09-08 – 2024-09-11 (×5): 12.5 mg via ORAL
  Filled 2024-09-08 (×6): qty 1

## 2024-09-08 MED ORDER — LEVETIRACETAM 500 MG PO TABS
2000.0000 mg | ORAL_TABLET | Freq: Two times a day (BID) | ORAL | Status: DC
Start: 2024-09-08 — End: 2024-09-11
  Administered 2024-09-08 – 2024-09-11 (×6): 2000 mg via ORAL
  Filled 2024-09-08 (×6): qty 4

## 2024-09-08 MED ORDER — ATORVASTATIN CALCIUM 40 MG PO TABS
40.0000 mg | ORAL_TABLET | Freq: Every day | ORAL | Status: DC
  Administered 2024-09-09 – 2024-09-10 (×2): 40 mg via ORAL
  Filled 2024-09-08 (×2): qty 1
  Filled 2024-09-08: qty 4

## 2024-09-08 NOTE — ED Provider Notes (Signed)
 [place] EMERGENCY DEPARTMENT AT [facility] Provider Note   CSN: [id number] Arrival date & time: 09/08/24  1413     Patient presents with: Weakness   Travis Winter Trefz. is a 88 y.o. male with a history of CVA, seizures, PE on Eliquis , prostate cancer with mets, glaucoma HTN, HLD who presents for fatigue for the past 2 days. Patient was recently evaluated at [facility] ED on 08/30/24 for dehydration and UTI.  He was given Keflex  on discharge.  Since coming home, the patient states that he felt a little bit better but started feeling fatigued again in 2 days.  He has also been having diarrhea and states that he was unable to tell whether he was peeing or pooping due to his loose stools.  Patient has poor eyesight due to his glaucoma so his daughter told him that she noticed bright red blood with his bowel movement yesterday.  He takes Eliquis  for about 1 year for prior PE and has been taking this as prescribed.  Last bowel movement was earlier today.  Denies any pain with defecation.   Denies chest pain, shortness of breath, abdominal pain, dizziness, nausea/vomiting, pain with defecation or any other symptoms at this time.     Prior to Admission medications   Medication Sig Start Date End Date Taking? Authorizing Provider  acetaminophen  (TYLENOL ) 500 MG tablet Take 500 mg by mouth daily as needed (back pain).    [provider]  amLODipine  (NORVASC ) 10 MG tablet Take 1 tablet (10 mg total) by mouth daily. 02/19/24   Simmons-Robinson, Rockie, MD  apixaban  (ELIQUIS ) 5 MG TABS tablet Take 1 tablet (5 mg total) by mouth 2 (two) times daily. 08/10/24   Simmons-Robinson, Makiera, MD  atorvastatin  (LIPITOR) 40 MG tablet TAKE 1 TABLET BY MOUTH DAILY AT  6 PM 12/23/23   Simmons-Robinson, Makiera, MD  brimonidine  (ALPHAGAN ) 0.2 % ophthalmic solution Place 1 drop into both eyes 2 (two) times daily.    [provider]  cephALEXin  (KEFLEX ) 500 MG capsule Take 1 capsule (500 mg  total) by mouth 3 (three) times daily. 08/30/24   Dean Clarity, MD  DIAZEPAM  PO Take 1 tablet by mouth as needed (seizures).    [provider]  diazePAM , 15 MG Dose, (VALTOCO  15 MG DOSE) 2 x 7.5 MG/0.1ML LQPK Spray in each nostril as needed for seizure. May use second dose after 4 hours if needed. 04/02/22   Georjean Darice HERO, MD  dorzolamide  (TRUSOPT ) 2 % ophthalmic solution Place 1 drop into both eyes 2 (two) times daily.    [provider]  dorzolamide -timolol  (COSOPT ) 2-0.5 % ophthalmic solution 1 drop 2 (two) times daily. 06/05/24   [provider]  lacosamide  (VIMPAT ) 50 MG TABS tablet Take 1 tablet every morning 07/08/24   Aquino, Karen M, MD  Lacosamide  100 MG TABS Take 1 tablet every night 07/08/24   Aquino, Karen M, MD  latanoprost  (XALATAN ) 0.005 % ophthalmic solution Place 1 drop into both eyes at bedtime.    [provider]  levETIRAcetam  (KEPPRA ) 1000 MG tablet Take 2 tablets (2,000 mg total) by mouth 2 (two) times daily. 04/20/24   Georjean Darice HERO, MD  metoprolol  tartrate (LOPRESSOR ) 25 MG tablet TAKE ONE-HALF TABLET BY MOUTH  TWICE DAILY 12/30/23   Gasper Nancyann BRAVO, MD  mirtazapine  (REMERON  SOL-TAB) 15 MG disintegrating tablet Take 1 tablet (15 mg total) by mouth at bedtime. 02/07/23   Simmons-Robinson, Rockie, MD  timolol  (BETIMOL ) 0.5 % ophthalmic solution  Place 1 drop into both eyes 2 (two) times daily.    [provider]  VYZULTA 0.024 % SOLN Apply 1 drop to eye at bedtime. 04/22/24   [provider]    Allergies: Patient has no known allergies.    Review of Systems  Constitutional:  Positive for fatigue. Negative for appetite change.  Respiratory:  Negative for cough, shortness of breath and wheezing.   Cardiovascular:  Negative for chest pain and leg swelling.  Gastrointestinal:  Positive for blood in stool and diarrhea. Negative for abdominal pain, constipation, nausea, rectal pain and vomiting.  Skin:  Negative for pallor.   Neurological:  Negative for dizziness, light-headedness and headaches.    Updated Vital Signs BP (!) 108/56   Pulse 80   Temp 98.2 F (36.8 C)   Resp 17   SpO2 100%   Physical Exam Constitutional:      General: He is not in acute distress.    Appearance: Normal appearance. He is normal weight. He is not ill-appearing, toxic-appearing or diaphoretic.  HENT:     Head: Normocephalic and atraumatic.     Mouth/Throat:     Mouth: Mucous membranes are moist.     Pharynx: Oropharynx is clear.  Eyes:     General: No scleral icterus.    Extraocular Movements: Extraocular movements intact.     Conjunctiva/sclera: Conjunctivae normal.     Pupils: Pupils are equal, round, and reactive to light.     Comments: Kept eyes closed during exam  Cardiovascular:     Rate and Rhythm: Normal rate.     Heart sounds: Murmur (harsh systolic murmur) heard.  Pulmonary:     Effort: Pulmonary effort is normal. No respiratory distress.     Breath sounds: Normal breath sounds. No stridor. No wheezing, rhonchi or rales.  Abdominal:     General: Abdomen is flat. There is no distension.     Palpations: Abdomen is soft. There is no mass.     Tenderness: There is no abdominal tenderness. There is no guarding or rebound.  Genitourinary:    Rectum: Normal. No mass, tenderness, anal fissure or external hemorrhoid.  Musculoskeletal:        General: No swelling or deformity.  Skin:    General: Skin is dry.     Coloration: Skin is not jaundiced or pale.     Findings: No bruising, erythema or rash.  Neurological:     General: No focal deficit present.     Mental Status: He is alert. Mental status is at baseline.     Gait: Gait normal.  Psychiatric:        Mood and Affect: Mood normal.        Behavior: Behavior normal.     (all labs ordered are listed, but only abnormal results are displayed) Labs Reviewed  COMPREHENSIVE METABOLIC PANEL WITH GFR - Abnormal; Notable for the following components:       Result Value   Potassium 3.1 (*)    Glucose, Bld 123 (*)    Total Protein 6.1 (*)    Albumin 3.1 (*)    All other components within normal limits  CBC - Abnormal; Notable for the following components:   WBC 17.3 (*)    RBC 3.65 (*)    Hemoglobin 10.7 (*)    HCT 33.9 (*)    All other components within normal limits  CBG MONITORING, ED - Abnormal; Notable for the following components:   Glucose-Capillary 112 (*)  All other components within normal limits  URINALYSIS, ROUTINE W REFLEX MICROSCOPIC  POC OCCULT BLOOD, ED    EKG: EKG Interpretation Date/Time:  Wednesday September 08 2024 14:44:57 EDT Ventricular Rate:  80 PR Interval:  268 QRS Duration:  96 QT Interval:  376 QTC Calculation: 433 R Axis:   40  Text Interpretation: Sinus rhythm with 1st degree A-V block Nonspecific ST and T wave abnormality Abnormal ECG No significant change since last tracing Confirmed by Emil Share [phone]) on 09/08/2024 4:42:27 PM  Radiology: No results found.   Medications Ordered in the ED - No data to display   Medical Decision Making Patient presenting with fatigue with bright red blood in stool on Eliquis . Hgb down to 10.7 from 11.1 about a week ago.  Hemodynamically stable.  Not a significant drop but will require further workup given age, symptoms, and history of cancer. Will obtain FOBT for further guidance.  Most likely lower GI bleed, but UGIB cannot be definitively ruled out. Differential is broad and includes internal hemorrhoids, external hemorrhoids, anal fissure, AVM, diverticulitis, colorectal cancer, colitis, brisk upper GI bleed.  Patient also recently had UTI and was prescribed Keflex .  Now with leukocytosis of 17.3.  Will order further workup with urinalysis. Elevated white count could be in the setting of UTI from medication nonadherence, stress response, dehydration from diarrhea, GI infection.  CMP reviewed, potassium mildly decreased, will replete once no longer NPO.   EKG reviewed showing sinus rhythm with first-degree AV block consistent with prior EKG on 08/30/2024.  5:40PM Patient deferring further rectal exam for FOBT, prefers to wait for bowel movement. Would benefit from further evaluation by GI and continued monitoring for further bleeding episodes. Will consult for unassigned admission.  6:56PM Discussed patient case with [facility] who agree to come evaluate the patient for admission.  Final diagnoses:  Bright red blood per rectum    ED Discharge Orders     None          Waymond Cart, MD 09/08/24 1958    Emil Share, DO 09/08/24 2242

## 2024-09-08 NOTE — ED Triage Notes (Signed)
 Pt bib ems from home for weakness for the last 3 days. Pt also with bright red blood in stool, on eliquis . Pt had a near syncopal event while trying to get off the toilette today. VSS with ems.

## 2024-09-08 NOTE — H&P (Signed)
 History and Physical   [facility] FMW:[id number] DOB: 1936-09-27 DOA: 09/08/2024  PCP: Sharma Coyer, MD   Patient coming from: Home  Chief Complaint: Fatigue, rectal bleeding  HPI: Travis Salazar. is a 88 y.o. male with medical history significant of hypertension, hyperlipidemia, CVA, paroxysmal SVT, CAD, seizure disorder, right upper lobe mass, emphysema, prostate cancer status post prostatectomy, gout, glaucoma, DVT/PE presenting with worsening fatigue and bright red blood per rectum.  Daughter assist with history. Patient has had 2 to 3 days of worsening fatigue.  Has ongoing liquid diarrhea with bright red blood (Daughter saw better due glaucoma).   Was seen at [facility] in 9/15 for dehydration and UTI he received IV fluids and was prescribed Keflex .  He felt better initially but has had worsening symptoms for the past 2 to 3 days as above.  Denies fevers, chills, shortness of breath, chest pain, abdominal pain, nausea, vomiting.  ED Course: Vital signs in the ED notable for blood pressure in the 100s-110 systolic.  Lab workup included CMP with potassium 3.1, glucose 123, protein 61, albumin 3.1.  CBC with leukocytosis 17.3, hemoglobin stable at 10.7 (though may be hemoconcentrated).  Urinalysis pending.  FOBT pending.  No imaging.  No initial interventions.  Message sent to GI for consult in the morning.  Review of Systems: As per HPI otherwise all other systems reviewed and are negative.  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)    History of prostate cancer    History of pulmonary embolus (PE)    Hyperlipidemia    Hypertension    Laceration of left eyebrow 10/06/2021   Mass of upper lobe of right lung    Mild  mitral stenosis by prior echocardiogram    Moderate to severe aortic stenosis    Primary open-angle glaucoma, bilateral, severe stage    Seizures (HCC)    Stroke [facility])     Past Surgical History:  Procedure Laterality Date   CATARACT EXTRACTION W/PHACO Right 07/16/2023   Procedure: CATARACT EXTRACTION PHACO AND INTRAOCULAR LENS PLACEMENT (IOC) RIGHT MALYUGIN, OMIDRIA , VISION BLUE, KAHOOK DUAL BLADE GONIOTOMY 8.19 1.03.2;  Surgeon: Enola Feliciano Hugger, MD;  Location: [facility];  Service: Ophthalmology;  Laterality: Right;   CATARACT EXTRACTION W/PHACO Left 07/31/2023   Procedure: CATARACT EXTRACTION PHACO AND INTRAOCULAR LENS PLACEMENT (IOC) LEFT OMIDRIA  VISION BLUE KAHOOK DUAL BLADE GONIOTOMY  7.40  01:00.8;  Surgeon: Enola Feliciano Hugger, MD;  Location: [facility];  Service: Ophthalmology;  Laterality: Left;   LIPOMA EXCISION     neck   PROSTATECTOMY     due to prostate cancer    Social History  reports that he quit smoking about 14 years ago. His smoking use included cigarettes. He started smoking about 64 years ago. He has a 50 pack-year smoking history. He has never used smokeless tobacco. He reports that he does not drink alcohol and does not use drugs.  No Known Allergies  Family History  Problem Relation Age of Onset   COPD Mother    Heart attack Father   Reviewed on admission  Prior to Admission medications   Medication Sig Start Date End Date Taking? Authorizing Provider  acetaminophen  (TYLENOL ) 500 MG tablet Take 500 mg by mouth  daily as needed (back pain).    [provider]  amLODipine  (NORVASC ) 10 MG tablet Take 1 tablet (10 mg total) by mouth daily. 02/19/24   Simmons-Robinson, Rockie, MD  apixaban  (ELIQUIS ) 5 MG TABS tablet Take 1 tablet (5 mg total) by mouth 2 (two) times daily. 08/10/24   Simmons-Robinson, Makiera, MD  atorvastatin  (LIPITOR) 40 MG tablet TAKE 1 TABLET BY MOUTH DAILY AT  6 PM 12/23/23   Simmons-Robinson, Makiera, MD   brimonidine  (ALPHAGAN ) 0.2 % ophthalmic solution Place 1 drop into both eyes 2 (two) times daily.    [provider]  cephALEXin  (KEFLEX ) 500 MG capsule Take 1 capsule (500 mg total) by mouth 3 (three) times daily. 08/30/24   Dean Clarity, MD  DIAZEPAM  PO Take 1 tablet by mouth as needed (seizures).    [provider]  diazePAM , 15 MG Dose, (VALTOCO  15 MG DOSE) 2 x 7.5 MG/0.1ML LQPK Spray in each nostril as needed for seizure. May use second dose after 4 hours if needed. 04/02/22   Georjean Darice HERO, MD  dorzolamide  (TRUSOPT ) 2 % ophthalmic solution Place 1 drop into both eyes 2 (two) times daily.    [provider]  dorzolamide -timolol  (COSOPT ) 2-0.5 % ophthalmic solution 1 drop 2 (two) times daily. 06/05/24   [provider]  lacosamide  (VIMPAT ) 50 MG TABS tablet Take 1 tablet every morning 07/08/24   Aquino, Karen M, MD  Lacosamide  100 MG TABS Take 1 tablet every night 07/08/24   Aquino, Karen M, MD  latanoprost  (XALATAN ) 0.005 % ophthalmic solution Place 1 drop into both eyes at bedtime.    [provider]  levETIRAcetam  (KEPPRA ) 1000 MG tablet Take 2 tablets (2,000 mg total) by mouth 2 (two) times daily. 04/20/24   Georjean Darice HERO, MD  metoprolol  tartrate (LOPRESSOR ) 25 MG tablet TAKE ONE-HALF TABLET BY MOUTH  TWICE DAILY 12/30/23   Gasper Nancyann BRAVO, MD  mirtazapine  (REMERON  SOL-TAB) 15 MG disintegrating tablet Take 1 tablet (15 mg total) by mouth at bedtime. 02/07/23   Simmons-Robinson, Rockie, MD  timolol  (BETIMOL ) 0.5 % ophthalmic solution Place 1 drop into both eyes 2 (two) times daily.    [provider]  VYZULTA 0.024 % SOLN Apply 1 drop to eye at bedtime. 04/22/24   [provider]    Physical Exam: Vitals:   09/08/24 1422 09/08/24 1723 09/08/24 1813  BP: (!) 114/50 (!) 108/56   Pulse: 78 80   Resp: 18 17   Temp: 98 F (36.7 C)  98.2 F (36.8 C)  SpO2: 100% 100%     Physical Exam Constitutional:      General: He is not  in acute distress.    Appearance: Normal appearance.  HENT:     Head: Normocephalic and atraumatic.     Mouth/Throat:     Mouth: Mucous membranes are moist.     Pharynx: Oropharynx is clear.  Eyes:     Extraocular Movements: Extraocular movements intact.     Pupils: Pupils are equal, round, and reactive to light.  Cardiovascular:     Rate and Rhythm: Normal rate and regular rhythm.     Pulses: Normal pulses.     Heart sounds: Normal heart sounds.  Pulmonary:     Effort: Pulmonary effort is normal. No respiratory distress.     Breath sounds: Normal breath sounds.  Abdominal:     General: Bowel sounds are normal. There is no distension.     Palpations: Abdomen is soft.  Tenderness: There is no abdominal tenderness.  Musculoskeletal:        General: No swelling or deformity.  Skin:    General: Skin is warm and dry.  Neurological:     General: No focal deficit present.     Mental Status: Mental status is at baseline.     Labs on Admission: I have personally reviewed following labs and imaging studies  CBC: Recent Labs  Lab 09/08/24 1510  WBC 17.3*  HGB 10.7*  HCT 33.9*  MCV 92.9  PLT 164    Basic Metabolic Panel: Recent Labs  Lab 09/08/24 1510  NA 137  K 3.1*  CL 103  CO2 22  GLUCOSE 123*  BUN 15  CREATININE 1.06  CALCIUM  8.9    GFR: CrCl cannot be calculated (Unknown ideal weight.).  Liver Function Tests: Recent Labs  Lab 09/08/24 1510  AST 21  ALT 10  ALKPHOS 50  BILITOT 1.1  PROT 6.1*  ALBUMIN 3.1*    Urine analysis:    Component Value Date/Time   COLORURINE YELLOW 08/30/2024 1851   APPEARANCEUR HAZY (A) 08/30/2024 1851   LABSPEC 1.008 08/30/2024 1851   PHURINE 6.0 08/30/2024 1851   GLUCOSEU NEGATIVE 08/30/2024 1851   HGBUR NEGATIVE 08/30/2024 1851   BILIRUBINUR NEGATIVE 08/30/2024 1851   KETONESUR 5 (A) 08/30/2024 1851   PROTEINUR NEGATIVE 08/30/2024 1851   NITRITE POSITIVE (A) 08/30/2024 1851   LEUKOCYTESUR TRACE (A) 08/30/2024  1851    Radiological Exams on Admission: No results found.  EKG: Independently reviewed.  Sinus rhythm at 80 bpm.  First-degree AV block.  Nonspecific T wave changes.  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 > Hemoglobin largely stable at 10.7 though could have a component of hemoconcentration.  Patient has deferred FOBT in ED, awaiting stool sample > With diarrhea, bright red blood per rectum, leukocytosis.  Will evaluate for infectious process as well (though no abdominal pain). > Is on Eliquis  for prior DVT/PE as below. > GI consulted in the ED - Monitor on telemetry overnight - Appreciate GI recommendations and assistance - Type and screen - FOBT when able - Trend hemoglobin - C. Difficile panel, GI pathogen panel - Liquid diet  Hypokalemia Mild Dehydration > Mild dehydration in setting of diarrhea.  Also potassium 3.1. - 1 L IV fluids - 40 mEq p.o. potassium - Check magnesium - Trend renal function and electrolytes  Hypertension - Low-normal BP in the ED - Hold amlodipine  - Continue metoprolol   Hyperlipidemia - Continue home atorvastatin   History of CVA - Continue home atorvastatin  - Holding Eliquis  as above  Paroxysmal SVT - Continue metoprolol   Seizure disorder - Continue home Vimpat , Keppra   Right upper lobe lung mass > Presumed malignancy, has opted not to pursue for treatment  Prostate cancer - Status post prostatectomy  Glaucoma - Continue eyedrops  History of DVT/PE > In the setting of presumed lung malignancy as above.   In 2022. - Holding Eliquis  as above  DVT prophylaxis: SCDs Code Status:   Full  Family Communication:  Updated at bedside Disposition Plan:  Patient is from:  Home  Anticipated DC to:  Home  Anticipated DC date:  1 to 3 days  Anticipated DC barriers: None  Consults called:  Gastroenterology messaged by EDP for consult. Admission status:  Observation, telemetry  Severity of Illness: The appropriate patient status for this patient is OBSERVATION. Observation status is judged to be reasonable and necessary in order to provide the required intensity of service to ensure the patient's safety. The patient's presenting symptoms, physical exam findings, and initial radiographic and laboratory data in the context of their medical condition is felt to place them at decreased risk for further clinical deterioration. Furthermore, it is anticipated that the patient will be medically stable for discharge from the hospital within 2 midnights of admission.    Marsa KATHEE Scurry MD [facility]  How to contact the [facility] Attending or Consulting provider 7A - 7P or covering provider during after hours 7P -7A, for this patient?   Check the care team in [facility] and look for a) attending/consulting [facility] provider listed and b) the [facility] team listed Log into [email or web address] and use [place]'s universal password to access. If you do not have the password, please contact the hospital operator. Locate the [facility] provider you are looking for under [facility] and page to a number that you can be directly reached. If you still have difficulty reaching the provider, please page the [facility] (Director on Call) for the Hospitalists listed on amion for assistance.  09/08/2024, 6:45 PM

## 2024-09-09 DIAGNOSIS — Z7901 Long term (current) use of anticoagulants: Secondary | ICD-10-CM | POA: Diagnosis not present

## 2024-09-09 DIAGNOSIS — Z8673 Personal history of transient ischemic attack (TIA), and cerebral infarction without residual deficits: Secondary | ICD-10-CM | POA: Diagnosis not present

## 2024-09-09 DIAGNOSIS — K922 Gastrointestinal hemorrhage, unspecified: Secondary | ICD-10-CM | POA: Diagnosis not present

## 2024-09-09 DIAGNOSIS — I10 Essential (primary) hypertension: Secondary | ICD-10-CM | POA: Diagnosis not present

## 2024-09-09 DIAGNOSIS — K625 Hemorrhage of anus and rectum: Secondary | ICD-10-CM | POA: Diagnosis not present

## 2024-09-09 LAB — CBC
HCT: 30.3 % — ABNORMAL LOW (ref 39.0–52.0)
Hemoglobin: 9.7 g/dL — ABNORMAL LOW (ref 13.0–17.0)
MCH: 28.9 pg (ref 26.0–34.0)
MCHC: 32 g/dL (ref 30.0–36.0)
MCV: 90.2 fL (ref 80.0–100.0)
Platelets: 144 K/uL — ABNORMAL LOW (ref 150–400)
RBC: 3.36 MIL/uL — ABNORMAL LOW (ref 4.22–5.81)
RDW: 14 % (ref 11.5–15.5)
WBC: 12.8 K/uL — ABNORMAL HIGH (ref 4.0–10.5)
nRBC: 0 % (ref 0.0–0.2)

## 2024-09-09 LAB — URINALYSIS, ROUTINE W REFLEX MICROSCOPIC
Bilirubin Urine: NEGATIVE
Glucose, UA: NEGATIVE mg/dL
Ketones, ur: NEGATIVE mg/dL
Leukocytes,Ua: NEGATIVE
Nitrite: NEGATIVE
Protein, ur: NEGATIVE mg/dL
Specific Gravity, Urine: 1.018 (ref 1.005–1.030)
pH: 5 (ref 5.0–8.0)

## 2024-09-09 LAB — COMPREHENSIVE METABOLIC PANEL WITH GFR
ALT: 10 U/L (ref 0–44)
AST: 21 U/L (ref 15–41)
Albumin: 2.6 g/dL — ABNORMAL LOW (ref 3.5–5.0)
Alkaline Phosphatase: 47 U/L (ref 38–126)
Anion gap: 7 (ref 5–15)
BUN: 15 mg/dL (ref 8–23)
CO2: 22 mmol/L (ref 22–32)
Calcium: 8.6 mg/dL — ABNORMAL LOW (ref 8.9–10.3)
Chloride: 107 mmol/L (ref 98–111)
Creatinine, Ser: 1 mg/dL (ref 0.61–1.24)
GFR, Estimated: >60 mL/min (ref >60)
Glucose, Bld: 86 mg/dL (ref 70–99)
Potassium: 3.5 mmol/L (ref 3.5–5.1)
Sodium: 136 mmol/L (ref 135–145)
Total Bilirubin: 0.9 mg/dL (ref 0.0–1.2)
Total Protein: 5.5 g/dL — ABNORMAL LOW (ref 6.5–8.1)

## 2024-09-09 MED ORDER — METOPROLOL TARTRATE 5 MG/5ML IV SOLN
5.0000 mg | INTRAVENOUS | Status: DC | PRN
Start: 2024-09-09 — End: 2024-09-11

## 2024-09-09 MED ORDER — SODIUM CHLORIDE 0.9 % IV SOLN: INTRAVENOUS | Status: AC

## 2024-09-09 MED ORDER — LATANOPROST 0.005 % OP SOLN
1.0000 [drp] | Freq: Every day | OPHTHALMIC | Status: DC
  Administered 2024-09-09 – 2024-09-10 (×2): 1 [drp] via OPHTHALMIC
  Filled 2024-09-09: qty 2.5

## 2024-09-09 MED ORDER — IPRATROPIUM-ALBUTEROL 0.5-2.5 (3) MG/3ML IN SOLN: 3.0000 mL | RESPIRATORY_TRACT | Status: DC | PRN

## 2024-09-09 MED ORDER — SIMETHICONE 80 MG PO CHEW
240.0000 mg | CHEWABLE_TABLET | Freq: Once | ORAL | Status: AC
  Administered 2024-09-09: 240 mg via ORAL
  Filled 2024-09-09: qty 3

## 2024-09-09 MED ORDER — POTASSIUM CHLORIDE 10 MEQ/100ML IV SOLN
10.0000 meq | INTRAVENOUS | Status: AC
  Administered 2024-09-09 (×4): 10 meq via INTRAVENOUS
  Filled 2024-09-09 (×4): qty 100

## 2024-09-09 MED ORDER — POLYETHYLENE GLYCOL 3350 17 GM/SCOOP PO POWD
119.0000 g | Freq: Once | ORAL | Status: AC
  Administered 2024-09-09: 119 g via ORAL
  Filled 2024-09-09: qty 119

## 2024-09-09 MED ORDER — OXYCODONE HCL 5 MG PO TABS: 5.0000 mg | ORAL_TABLET | ORAL | Status: DC | PRN

## 2024-09-09 MED ORDER — HYDRALAZINE HCL 20 MG/ML IJ SOLN: 10.0000 mg | INTRAMUSCULAR | Status: DC | PRN

## 2024-09-09 MED ORDER — DEXTROSE-SODIUM CHLORIDE 5-0.45 % IV SOLN: INTRAVENOUS | Status: AC

## 2024-09-09 NOTE — Hospital Course (Addendum)
 Brief Narrative:   88 year old with history of HTN, HLD, CVA, paroxysmal SVT, CAD, seizure, right upper lobe mass, emphysema, prostate cancer status post prostatectomy, gout, glaucoma, DVT/PE presenting with worsening , fatigue and bright red blood per rectum.  Recently was at [facility] long for dehydration and UTI for which she was prescribed Keflex .  [facility] consulted.  9/26: C. difficile antigen and toxin positive so started on Dificid .  No need for any colonoscopy and hemoglobin stable.  Ordered PT and OT evaluation.  9/27: Remained hemodynamically stable.  Mild hypomagnesemia which was repleted.  No more diarrhea.  No abdominal pain.  Hemoglobin stable and no concern of GI bleed at this time.  Patient will resume his home Eliquis  on discharge.  He is being given 8 more days of Dificid  to complete a 10-day course for C. difficile colitis.  Patient will follow-up with gastroenterology as outpatient for further determination when he will need colonoscopy.  PT and OT are recommending home health which is ordered.  Patient will continue the rest of his home medications, care was discussed with his daughter who is also his caregiver and he will follow-up with his providers for further assistance.  Patient remained high risk for mortality and readmission based on advanced age and significant underlying comorbidities.  Assessment & Plan:  Rectal bleeding -Baseline hemoglobin around 11.0, admission hemoglobin 10.7 slowly trending downwards likely from hydration/dilution.  Today at 9.8.  Colonoscopy in 2012 showed medium sized lipoma in the transverse colon otherwise unremarkable.   No more active bleeding -Monitor hemoglobin -No need for any colonoscopy has tested positive for C. difficile which can also cause diarrhea and bleeding  C. difficile colitis. C. difficile antigen and toxin positive, recent use of antibiotic.  Diarrhea improved -Started on deficit-need to complete a 10-day  course  Hypokalemia/dehydration - Improved  Leukocytosis - Improved, likely secondary to C. difficile  Essential hypertension Blood pressure currently within goal -Continue metoprolol .  Holding Norvasc  for now - IV as needed  Hyperlipidemia - Continue statin  DVT/PE - Patient will resume Eliquis  on discharge  History of CVA - Continue statin, will resume Eliquis  on discharge  Paroxysmal SVT - On metoprolol  and Eliquis  at home.    Seizure disorder - Vimpat , Keppra    Right upper lobe lung mass - Presumed malignancy, has opted not to pursue this  Prostate cancer status post prostatectomy  Glaucoma - Continue home meds and eyedrops

## 2024-09-09 NOTE — Care Management Obs Status (Signed)
 MEDICARE OBSERVATION STATUS NOTIFICATION   Patient Details  Name: Travis Salazar. MRN: [id number] Date of Birth: 1936-11-17   Medicare Observation Status Notification Given:  Yes  Verbally reviewed observation notice with Iva Linard telephonically at [phone]. Iva ask that I send a copy to the patient home address  Claretta Deed 09/09/2024, 10:00 AM

## 2024-09-09 NOTE — Plan of Care (Signed)

## 2024-09-09 NOTE — Progress Notes (Signed)
 PROGRESS NOTE    Travis Salazar.  FMW:[id number] DOB: January 28, 1936 DOA: 09/08/2024 PCP: Sharma Coyer, MD    Brief Narrative:   88 year old with history of HTN, HLD, CVA, paroxysmal SVT, CAD, seizure, right upper lobe mass, emphysema, prostate cancer status post prostatectomy, gout, glaucoma, DVT/PE presenting with worsening , fatigue and bright red blood per rectum.  Recently was at [facility] long for dehydration and UTI for which she was prescribed Keflex .  [facility] consulted.  Assessment & Plan:  Rectal bleeding -Baseline hemoglobin around 11.0, admission hemoglobin 10.7 slowly trending downwards likely from hydration/dilution. Colonoscopy in 2012 showed medium sized lipoma in the transverse colon otherwise unremarkable.  For now we will continue to hold Eliquis , [facility] has been consulted.  Question of diarrhea therefore GI panel and C. difficile has been sent. Currently NPO  Hypokalemia/dehydration -IV fluids, electrolyte repletion  Leukocytosis - Reactive?  Monitor  Essential hypertension -Continue metoprolol .  Holding Norvasc  for now - IV as needed  Hyperlipidemia - Continue statin  DVT/PE - Eliquis  currently on hold.  History of CVA - Continue statin, holding Eliquis   Paroxysmal SVT - On metoprolol  and Eliquis  at home.  IV as needed  Seizure disorder - Vimpat , Keppra    Right upper lobe lung mass - Presumed malignancy, has opted not to pursue this  Prostate cancer status post prostatectomy  Glaucoma - Continue home meds and eyedrops   DVT prophylaxis: SCDs Start: 09/08/24 1844      Code Status: Full Code Family Communication:   On going eval for rectal bleeding.    PT Follow up Recs:   Subjective:  Seen at bedside, no bleeding or bowel movements this morning.  Examination:  General exam: Appears calm and comfortable  Respiratory system: Clear to auscultation. Respiratory effort normal. Cardiovascular system: S1 & S2 heard, RRR. No JVD,  murmurs, rubs, gallops or clicks. No pedal edema. Gastrointestinal system: Abdomen is nondistended, soft and nontender. No organomegaly or masses felt. Normal bowel sounds heard. Central nervous system: Alert and oriented. No focal neurological deficits. Extremities: Symmetric 5 x 5 power. Skin: No rashes, lesions or ulcers Psychiatry: Judgement and insight appear normal. Mood & affect appropriate. This               Diet Orders (From admission, onward)     Start     Ordered   09/09/24 0915  Diet full liquid Room service appropriate? Yes; Fluid consistency: Thin  Diet effective now       Question Answer Comment  Room service appropriate? Yes   Fluid consistency: Thin      09/09/24 0916            Objective: Vitals:   09/08/24 1921 09/08/24 1946 09/09/24 0450 09/09/24 0738  BP: 134/60 125/67 (!) 116/58 (!) 101/50  Pulse: 78 77 65 67  Resp: 15 18    Temp: 97.6 F (36.4 C) (!) 97.4 F (36.3 C) 98.4 F (36.9 C) 97.6 F (36.4 C)  TempSrc: Oral Oral Oral Oral  SpO2: 100% 100% 98% 100%    Intake/Output Summary (Last 24 hours) at 09/09/2024 1213 Last data filed at 09/09/2024 9364 Gross per 24 hour  Intake --  Output 325 ml  Net -325 ml   There were no vitals filed for this visit.  Scheduled Meds:  atorvastatin   40 mg Oral q1800   brimonidine   1 drop Both Eyes BID   dorzolamide -timolol   1 drop Both Eyes BID   lacosamide   100 mg Oral QHS  lacosamide   50 mg Oral Daily   latanoprost   1 drop Both Eyes QHS   levETIRAcetam   2,000 mg Oral BID   metoprolol  tartrate  12.5 mg Oral BID   sodium chloride  flush  3 mL Intravenous Q12H   Continuous Infusions:  dextrose  5 % and 0.45 % NaCl      Nutritional status     There is no height or weight on file to calculate BMI.  Data Reviewed:   CBC: Recent Labs  Lab 09/08/24 1510 09/09/24 0436  WBC 17.3* 12.8*  HGB 10.7* 9.7*  HCT 33.9* 30.3*  MCV 92.9 90.2  PLT 164 144*   Basic Metabolic Panel: Recent  Labs  Lab 09/08/24 1510 09/08/24 2042 09/09/24 0436  NA 137  --  136  K 3.1*  --  3.5  CL 103  --  107  CO2 22  --  22  GLUCOSE 123*  --  86  BUN 15  --  15  CREATININE 1.06  --  1.00  CALCIUM  8.9  --  8.6*  MG  --  1.8  --    GFR: CrCl cannot be calculated (Unknown ideal weight.). Liver Function Tests: Recent Labs  Lab 09/08/24 1510 09/09/24 0436  AST 21 21  ALT 10 10  ALKPHOS 50 47  BILITOT 1.1 0.9  PROT 6.1* 5.5*  ALBUMIN 3.1* 2.6*   No results for input(s): LIPASE, AMYLASE in the last 168 hours. No results for input(s): AMMONIA in the last 168 hours. Coagulation Profile: No results for input(s): INR, PROTIME in the last 168 hours. Cardiac Enzymes: No results for input(s): CKTOTAL, CKMB, CKMBINDEX, TROPONINI in the last 168 hours. BNP (last 3 results) No results for input(s): PROBNP in the last 8760 hours. HbA1C: No results for input(s): HGBA1C in the last 72 hours. CBG: Recent Labs  Lab 09/08/24 1508  GLUCAP 112*   Lipid Profile: No results for input(s): CHOL, HDL, LDLCALC, TRIG, CHOLHDL, LDLDIRECT in the last 72 hours. Thyroid  Function Tests: No results for input(s): TSH, T4TOTAL, FREET4, T3FREE, THYROIDAB in the last 72 hours. Anemia Panel: No results for input(s): VITAMINB12, FOLATE, FERRITIN, TIBC, IRON, RETICCTPCT in the last 72 hours. Sepsis Labs: No results for input(s): PROCALCITON, LATICACIDVEN in the last 168 hours.  Recent Results (from the past 240 hours)  Resp panel by RT-PCR (RSV, Flu A&B, Covid) Anterior Nasal Swab     Status: None   Collection Time: 08/30/24  4:05 PM   Specimen: Anterior Nasal Swab  Result Value Ref Range Status   SARS Coronavirus 2 by RT PCR NEGATIVE NEGATIVE Final    Comment: (NOTE) SARS-CoV-2 target nucleic acids are NOT DETECTED.  The SARS-CoV-2 RNA is generally detectable in upper respiratory specimens during the acute phase of infection. The  lowest concentration of SARS-CoV-2 viral copies this assay can detect is 138 copies/mL. A negative result does not preclude SARS-Cov-2 infection and should not be used as the sole basis for treatment or other patient management decisions. A negative result may occur with  improper specimen collection/handling, submission of specimen other than nasopharyngeal swab, presence of viral mutation(s) within the areas targeted by this assay, and inadequate number of viral copies(<138 copies/mL). A negative result must be combined with clinical observations, patient history, and epidemiological information. The expected result is Negative.  Fact Sheet for Patients:  BloggerCourse.com  Fact Sheet for Healthcare Providers:  SeriousBroker.it  This test is no t yet approved or cleared by the [place]  FDA and  has  been authorized for detection and/or diagnosis of SARS-CoV-2 by FDA under an Emergency Use Authorization (EUA). This EUA will remain  in effect (meaning this test can be used) for the duration of the COVID-19 declaration under Section 564(b)(1) of the Act, 21 U.S.C.section 360bbb-3(b)(1), unless the authorization is terminated  or revoked sooner.       Influenza A by PCR NEGATIVE NEGATIVE Final   Influenza B by PCR NEGATIVE NEGATIVE Final    Comment: (NOTE) The Xpert Xpress SARS-CoV-2/FLU/RSV plus assay is intended as an aid in the diagnosis of influenza from Nasopharyngeal swab specimens and should not be used as a sole basis for treatment. Nasal washings and aspirates are unacceptable for Xpert Xpress SARS-CoV-2/FLU/RSV testing.  Fact Sheet for Patients: BloggerCourse.com  Fact Sheet for Healthcare Providers: SeriousBroker.it  This test is not yet approved or cleared by the [place]  FDA and has been authorized for detection and/or diagnosis of SARS-CoV-2 by FDA under  an Emergency Use Authorization (EUA). This EUA will remain in effect (meaning this test can be used) for the duration of the COVID-19 declaration under Section 564(b)(1) of the Act, 21 U.S.C. section 360bbb-3(b)(1), unless the authorization is terminated or revoked.     Resp Syncytial Virus by PCR NEGATIVE NEGATIVE Final    Comment: (NOTE) Fact Sheet for Patients: BloggerCourse.com  Fact Sheet for Healthcare Providers: SeriousBroker.it  This test is not yet approved or cleared by the [place]  FDA and has been authorized for detection and/or diagnosis of SARS-CoV-2 by FDA under an Emergency Use Authorization (EUA). This EUA will remain in effect (meaning this test can be used) for the duration of the COVID-19 declaration under Section 564(b)(1) of the Act, 21 U.S.C. section 360bbb-3(b)(1), unless the authorization is terminated or revoked.  Performed at [facility], [address], [place], [place] [address]          Radiology Studies: No results found.         LOS: 0 days   Time spent= 35 mins    Burgess JAYSON Dare, MD [facility]  If 7PM-7AM, please contact night-coverage  09/09/2024, 12:13 PM

## 2024-09-09 NOTE — Consult Note (Addendum)
 Consultation  Referring Provider: TRH/ Caleen Primary Care Physician:  Sharma Coyer, MD Primary Gastroenterologist: unassigned  Reason for Consultation: Diarrhea/hematochezia  HPI: Travis Salazar. is a 88 y.o. male with history of hypertension, previous CVA, history of paroxysmal SVT, coronary artery disease, seizure disorder, DVT/PE on Eliquis  and prostate cancer status post prostatectomy.  Also with COPD and known right upper lobe mass who presented to the emergency room with complaints of fatigue, diarrhea and bright red blood per rectum. Today patient says he had been having diarrhea for 3 to 4 days usually 2-3 diarrheal stools per day and was noticing bright red blood mixed with the diarrhea.  He denies any associated abdominal pain or cramping, no nausea or vomiting, appetite has been okay, no documented fever or chills.  No anal rectal pain or discomfort.  Patient is status post prostatectomy but has not had radiation. He had been seen in the ER at [facility] on 08/30/2024 for dehydration and UTI and was given a prescription for Keflex .  According to the notes he felt better for few days on the antibiotics but then symptoms have gradually worsened since with fatigue etc.  He currently does not have any complaints of dysuria or obvious hematuria.  Hemodynamically stable in the ER Labs-yesterday WBC 17.3/hemoglobin 10.7/hematocrit 33.9/platelets 164 Sodium 137/potassium 3.1/BUN 15/creatinine 1.06 Albumin 3.1 LFTs within normal limits GI path panel and stool for C. difficile ordered and both not collected as yet Hemoccult not collected  Today WBC 12.8/hemoglobin 9.7/hematocrit 30.3/platelets 144-baseline hemoglobin 11-12 Potassium 3.5/BUN 15/creatinine 1.0 UA with 11-20 RBCs/0-5 WBCs rare bacteria  No abdominal imaging done yesterday, CT of the abdomen pelvis on 08/24/2024 with contrast shows small sclerotic bone mets in the L3-L4 vertebral bodies and right ilium could  represent therapeutic response of previous occult small bone mets although new bone mets cannot definitely be excluded, stable L5 vertebral body metastases, no abdominal lymphadenopathy or other soft tissue metastatic disease stable 1.3 cm cystic lesion in the pancreatic tail likely representing an indolent cystic neoplasm/sidebranch IPMN.  Patient says he has felt fine overnight again no complaints of abdominal pain or cramping he has not had any further diarrhea or bleeding.  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)    History of prostate cancer    History of pulmonary embolus (PE)    Hyperlipidemia    Hypertension    Laceration of left eyebrow 10/06/2021   Mass of upper lobe of right lung    Mild mitral stenosis by prior echocardiogram    Moderate to severe aortic stenosis    Primary open-angle glaucoma, bilateral, severe stage    Seizures (HCC)    Stroke [facility])     Past Surgical History:  Procedure Laterality Date   CATARACT EXTRACTION W/PHACO Right 07/16/2023   Procedure: CATARACT EXTRACTION PHACO AND INTRAOCULAR LENS PLACEMENT (IOC) RIGHT MALYUGIN, OMIDRIA , VISION BLUE, KAHOOK DUAL BLADE GONIOTOMY 8.19 1.03.2;  Surgeon: Enola Feliciano Hugger, MD;  Location: [facility];  Service: Ophthalmology;  Laterality: Right;   CATARACT EXTRACTION W/PHACO Left 07/31/2023   Procedure: CATARACT EXTRACTION PHACO AND INTRAOCULAR LENS PLACEMENT (IOC) LEFT OMIDRIA  VISION BLUE KAHOOK DUAL  BLADE GONIOTOMY  7.40  01:00.8;  Surgeon: Enola Feliciano Hugger, MD;  Location: [facility];  Service: Ophthalmology;  Laterality: Left;   LIPOMA  EXCISION     neck   PROSTATECTOMY     due to prostate cancer    Prior to Admission medications   Medication Sig Start Date End Date Taking? Authorizing Provider  acetaminophen  (TYLENOL ) 500 MG tablet Take 500 mg by mouth daily as needed (back pain).   Yes [provider]  amLODipine  (NORVASC ) 10 MG tablet Take 1 tablet (10 mg total) by mouth daily. 02/19/24  Yes Simmons-Robinson, Makiera, MD  apixaban  (ELIQUIS ) 5 MG TABS tablet Take 1 tablet (5 mg total) by mouth 2 (two) times daily. 08/10/24  Yes Simmons-Robinson, Makiera, MD  atorvastatin  (LIPITOR) 40 MG tablet TAKE 1 TABLET BY MOUTH DAILY AT  6 PM 12/23/23  Yes Simmons-Robinson, Makiera, MD  brimonidine  (ALPHAGAN ) 0.2 % ophthalmic solution Place 1 drop into both eyes 2 (two) times daily.   Yes [provider]  diazePAM , 15 MG Dose, (VALTOCO  15 MG DOSE) 2 x 7.5 MG/0.1ML LQPK Spray in each nostril as needed for seizure. May use second dose after 4 hours if needed. 04/02/22  Yes Georjean Darice HERO, MD  dorzolamide -timolol  (COSOPT ) 2-0.5 % ophthalmic solution Place 1 drop into both eyes 2 (two) times daily. 06/05/24  Yes [provider]  lacosamide  (VIMPAT ) 50 MG TABS tablet Take 1 tablet every morning Patient taking differently: Take 50 mg by mouth daily. 07/08/24  Yes Georjean Darice HERO, MD  Lacosamide  100 MG TABS Take 1 tablet every night Patient taking differently: Take 1 tablet by mouth every evening. 07/08/24  Yes Georjean Darice HERO, MD  levETIRAcetam  (KEPPRA ) 1000 MG tablet Take 2 tablets (2,000 mg total) by mouth 2 (two) times daily. 04/20/24  Yes Georjean Darice HERO, MD  metoprolol  tartrate (LOPRESSOR ) 25 MG tablet TAKE ONE-HALF TABLET BY MOUTH  TWICE DAILY 12/30/23  Yes Gasper Nancyann BRAVO, MD  VYZULTA 0.024 % SOLN Apply 1 drop to eye at bedtime. 04/22/24  Yes [provider]  cephALEXin  (KEFLEX ) 500 MG capsule Take 1 capsule (500 mg total) by mouth 3 (three) times daily. Patient not taking: Reported on 09/08/2024 08/30/24    Dean Clarity, MD    Current Facility-Administered Medications  Medication Dose Route Frequency Provider Last Rate Last Admin   acetaminophen  (TYLENOL ) tablet 650 mg  650 mg Oral Q6H PRN Melvin, Alexander B, MD       Or   acetaminophen  (TYLENOL ) suppository 650 mg  650 mg Rectal Q6H PRN Seena Marsa NOVAK, MD       atorvastatin  (LIPITOR) tablet 40 mg  40 mg Oral q1800 Melvin, Alexander B, MD       brimonidine  (ALPHAGAN ) 0.2 % ophthalmic solution 1 drop  1 drop Both Eyes BID Melvin, Alexander B, MD   1 drop at 09/08/24 2145   dorzolamide -timolol  (COSOPT ) 2-0.5 % ophthalmic solution 1 drop  1 drop Both Eyes BID Melvin, Alexander B, MD   1 drop at 09/08/24 2144   lacosamide  (VIMPAT ) tablet 100 mg  100 mg Oral QHS Melvin, Alexander B, MD   100 mg at 09/08/24 2142   lacosamide  (VIMPAT ) tablet 50 mg  50 mg Oral Daily Melvin, Alexander B, MD       levETIRAcetam  (KEPPRA ) tablet 2,000 mg  2,000 mg Oral BID Melvin, Alexander B, MD   2,000 mg at 09/08/24 2143   metoprolol  tartrate (LOPRESSOR ) tablet 12.5 mg  12.5 mg Oral  BID Melvin, Alexander B, MD   12.5 mg at 09/08/24 2143   polyethylene glycol (MIRALAX  / GLYCOLAX ) packet 17 g  17 g Oral Daily PRN Seena Marsa NOVAK, MD       sodium chloride  flush (NS) 0.9 % injection 3 mL  3 mL Intravenous Q12H Seena Marsa NOVAK, MD   3 mL at 09/08/24 2207    Allergies as of 09/08/2024   (No Known Allergies)    Family History  Problem Relation Age of Onset   COPD Mother    Heart attack Father     Social History   Socioeconomic History   Marital status: Widowed    Spouse name: [name]   Number of children: 4   Years of education: 10th grade   Highest education level: 9th grade  Occupational History   Occupation: retired  Tobacco Use   Smoking status: Former    Current packs/day: 0.00    Average packs/day: 1 pack/day for 50.0 years (50.0 ttl pk-yrs)    Types: Cigarettes    Start date: 12/16/1959    Quit date: 12/15/2009    Years since  quitting: 14.7   Smokeless tobacco: Never   Tobacco comments:    already quit smoking  Vaping Use   Vaping status: Never Used  Substance and Sexual Activity   Alcohol use: No   Drug use: No   Sexual activity: Never  Other Topics Concern   Not on file  Social History Narrative   ** Merged History Encounter ** Right handed    Caffeine 2 cups daily   Home is one story   Social Drivers of Health   Financial Resource Strain: Low Risk  (06/29/2024)   Overall Financial Resource Strain (CARDIA)    Difficulty of Paying Living Expenses: Not hard at all  Food Insecurity: No Food Insecurity (09/09/2024)   Hunger Vital Sign    Worried About Running Out of Food in the Last Year: Never true    Ran Out of Food in the Last Year: Never true  Transportation Needs: No Transportation Needs (09/09/2024)   PRAPARE - Administrator, Civil Service (Medical): No    Lack of Transportation (Non-Medical): No  Physical Activity: Inactive (06/29/2024)   Exercise Vital Sign    Days of Exercise per Week: 0 days    Minutes of Exercise per Session: Not on file  Stress: No Stress Concern Present (06/29/2024)   [employer] of Occupational Health - Occupational Stress Questionnaire    Feeling of Stress: Not at all  Social Connections: Socially Isolated (09/09/2024)   Social Connection and Isolation Panel    Frequency of Communication with Friends and Family: More than three times a week    Frequency of Social Gatherings with Friends and Family: More than three times a week    Attends Religious Services: Never    Database administrator or Organizations: No    Attends Banker Meetings: Never    Marital Status: Widowed  Intimate Partner Violence: Not At Risk (09/09/2024)   Humiliation, Afraid, Rape, and Kick questionnaire    Fear of Current or Ex-Partner: No    Emotionally Abused: No    Physically Abused: No    Sexually Abused: No    Review of Systems: Pertinent positive and  negative review of systems were noted in the above HPI section.  All other review of systems was otherwise negative.   Physical Exam: Vital signs in last 24 hours: Temp:  [97.4 F (  36.3 C)-98.4 F (36.9 C)] 97.6 F (36.4 C) (09/25 0738) Pulse Rate:  [65-80] 67 (09/25 0738) Resp:  [15-18] 18 (09/24 1946) BP: (101-134)/(50-67) 101/50 (09/25 0738) SpO2:  [98 %-100 %] 100 % (09/25 0738)   General:   Alert,  Well-developed, well-nourished, elderly African-American male pleasant and cooperative in NAD Head:  Normocephalic and atraumatic. Eyes:  Sclera clear, no icterus.   Conjunctiva pink. Ears:  Normal auditory acuity. Nose:  No deformity, discharge,  or lesions. Mouth:  No deformity or lesions.   Neck:  Supple; no masses or thyromegaly. Lungs:  Clear throughout to auscultation.   No wheezes, crackles, or rhonchi. Heart:  Regular rate and rhythm; no murmurs, clicks, rubs,  or gallops. Abdomen:  Soft,nontender, BS active,nonpalp mass or hsm.   Rectal: Not done, external exam per ER MD negative Msk:  Symmetrical without gross deformities. . Pulses:  Normal pulses noted. Extremities:  Without clubbing or edema. Neurologic:  Alert and  oriented x4;  grossly normal neurologically. Skin:  Intact without significant lesions or rashes.. Psych:  Alert and cooperative. Normal mood and affect.  Intake/Output from previous day: 09/24 0701 - 09/25 0700 In: -  Out: 325 [Urine:325] Intake/Output this shift: No intake/output data recorded.  Lab Results: Recent Labs    09/08/24 1510 09/09/24 0436  WBC 17.3* 12.8*  HGB 10.7* 9.7*  HCT 33.9* 30.3*  PLT 164 144*   BMET Recent Labs    09/08/24 1510 09/09/24 0436  NA 137 136  K 3.1* 3.5  CL 103 107  CO2 22 22  GLUCOSE 123* 86  BUN 15 15  CREATININE 1.06 1.00  CALCIUM  8.9 8.6*   LFT Recent Labs    09/09/24 0436  PROT 5.5*  ALBUMIN 2.6*  AST 21  ALT 10  ALKPHOS 47  BILITOT 0.9   PT/INR No results for input(s): LABPROT,  INR in the last 72 hours. Hepatitis Panel No results for input(s): HEPBSAG, HCVAB, HEPAIGM, HEPBIGM in the last 72 hours.    IMPRESSION:  #44 88 year old African-American male with 3 to 4-day history of progressive weakness, reported diarrhea 2-3 times daily mixed with bright red blood. This is in the setting of anticoagulation with Eliquis   Hemoglobin down 1 g since admission, no further active bleeding, no diarrhea or stools overnight  Patient had been on Keflex  for UTI at time of onset of diarrhea-rule out antibiotic associated diarrhea, rule out C. difficile. Etiology of hematochezia is not clear at this time rule out secondary to internal hemorrhoids, versus occult mucosal lesion  #2 history of DVT/PE-on Eliquis  #3.  Prior history of CVA #4.  Seizure disorder #5.  History of prostate cancer status post prostatectomy, no radiation #6.  Abnormal UA rule out persistent UTI #7  Moderate aortic stenosis  PLAN: Full liquid diet Continue to hold Eliquis  Await stool for C. difficile and GI path panel Check urine culture Continue to trend hemoglobin Will consider  colonoscopy if infectious workup negative-tentatively planning for colonoscopy tomorrow with prep later today GI will follow with you.   Hoang Reich EsterwoodPA-C  09/09/2024, 8:26 AM

## 2024-09-09 NOTE — Progress Notes (Signed)
 Notified Dr. Caleen that patient had not urinated on this shifty. Bladder scan revealed 282 Mls. Dr. Caleen ordered me to [facility] cath patient. 400 mls of urine out. Patient was yelling while we were attempting to cath him. He stated that he had a lot of pain.

## 2024-09-09 NOTE — Plan of Care (Signed)

## 2024-09-10 ENCOUNTER — Encounter (HOSPITAL_COMMUNITY)
Admission: EM | Disposition: A | Discharge status: Home Health | Payer: Self-pay | Source: Home / Self Care | Attending: Emergency Medicine

## 2024-09-10 ENCOUNTER — Other Ambulatory Visit (HOSPITAL_COMMUNITY): Payer: Self-pay

## 2024-09-10 ENCOUNTER — Telehealth (HOSPITAL_COMMUNITY): Payer: Self-pay | Admitting: Pharmacy Technician

## 2024-09-10 DIAGNOSIS — Z8673 Personal history of transient ischemic attack (TIA), and cerebral infarction without residual deficits: Secondary | ICD-10-CM | POA: Diagnosis not present

## 2024-09-10 DIAGNOSIS — K625 Hemorrhage of anus and rectum: Secondary | ICD-10-CM | POA: Diagnosis not present

## 2024-09-10 DIAGNOSIS — M1A09X0 Idiopathic chronic gout, multiple sites, without tophus (tophi): Secondary | ICD-10-CM

## 2024-09-10 DIAGNOSIS — I2510 Atherosclerotic heart disease of native coronary artery without angina pectoris: Secondary | ICD-10-CM | POA: Diagnosis not present

## 2024-09-10 DIAGNOSIS — H409 Unspecified glaucoma: Secondary | ICD-10-CM | POA: Diagnosis not present

## 2024-09-10 DIAGNOSIS — D72829 Elevated white blood cell count, unspecified: Secondary | ICD-10-CM | POA: Diagnosis not present

## 2024-09-10 DIAGNOSIS — A0472 Enterocolitis due to Clostridium difficile, not specified as recurrent: Secondary | ICD-10-CM | POA: Diagnosis not present

## 2024-09-10 DIAGNOSIS — J439 Emphysema, unspecified: Secondary | ICD-10-CM | POA: Diagnosis not present

## 2024-09-10 DIAGNOSIS — E876 Hypokalemia: Secondary | ICD-10-CM | POA: Diagnosis not present

## 2024-09-10 DIAGNOSIS — I471 Supraventricular tachycardia, unspecified: Secondary | ICD-10-CM | POA: Diagnosis not present

## 2024-09-10 LAB — CBC
HCT: 30.5 % — ABNORMAL LOW (ref 39.0–52.0)
Hemoglobin: 9.9 g/dL — ABNORMAL LOW (ref 13.0–17.0)
MCH: 29.4 pg (ref 26.0–34.0)
MCHC: 32.5 g/dL (ref 30.0–36.0)
MCV: 90.5 fL (ref 80.0–100.0)
Platelets: 150 K/uL (ref 150–400)
RBC: 3.37 MIL/uL — ABNORMAL LOW (ref 4.22–5.81)
RDW: 13.8 % (ref 11.5–15.5)
WBC: 8.8 K/uL (ref 4.0–10.5)
nRBC: 0 % (ref 0.0–0.2)

## 2024-09-10 LAB — GASTROINTESTINAL PANEL BY PCR, STOOL (REPLACES STOOL CULTURE)

## 2024-09-10 LAB — BASIC METABOLIC PANEL WITH GFR
Anion gap: 9 (ref 5–15)
BUN: 10 mg/dL (ref 8–23)
CO2: 23 mmol/L (ref 22–32)
Calcium: 8.6 mg/dL — ABNORMAL LOW (ref 8.9–10.3)
Chloride: 102 mmol/L (ref 98–111)
Creatinine, Ser: 0.83 mg/dL (ref 0.61–1.24)
GFR, Estimated: >60 mL/min (ref >60)
Glucose, Bld: 110 mg/dL — ABNORMAL HIGH (ref 70–99)
Potassium: 3.8 mmol/L (ref 3.5–5.1)
Sodium: 134 mmol/L — ABNORMAL LOW (ref 135–145)

## 2024-09-10 LAB — C DIFFICILE QUICK SCREEN W PCR REFLEX
C Diff antigen: POSITIVE — AB
C Diff interpretation: DETECTED
C Diff toxin: POSITIVE — AB

## 2024-09-10 LAB — MAGNESIUM: Magnesium: 1.8 mg/dL (ref 1.7–2.4)

## 2024-09-10 SURGERY — COLONOSCOPY
Anesthesia: Monitor Anesthesia Care

## 2024-09-10 MED ORDER — FIDAXOMICIN 200 MG PO TABS
200.0000 mg | ORAL_TABLET | Freq: Two times a day (BID) | ORAL | Status: DC
  Administered 2024-09-10 – 2024-09-11 (×4): 200 mg via ORAL
  Filled 2024-09-10 (×5): qty 1

## 2024-09-10 NOTE — Progress Notes (Signed)
 PROGRESS NOTE    Travis Salazar.  FMW:[id number] DOB: [birth year] DOA: 09/08/2024 PCP: Sharma Coyer, MD    Brief Narrative:   88 year old with history of HTN, HLD, CVA, paroxysmal SVT, CAD, seizure, right upper lobe mass, emphysema, prostate cancer status post prostatectomy, gout, glaucoma, DVT/PE presenting with worsening , fatigue and bright red blood per rectum.  Recently was at [facility] long for dehydration and UTI for which she was prescribed Keflex .  [facility] consulted.  9/26: C. difficile antigen and toxin positive so started on Dificid .  No need for any colonoscopy and hemoglobin stable.  Ordered PT and OT evaluation  Assessment & Plan:  Rectal bleeding -Baseline hemoglobin around 11.0, admission hemoglobin 10.7 slowly trending downwards likely from hydration/dilution.  Today at 9.9.  Colonoscopy in 2012 showed medium sized lipoma in the transverse colon otherwise unremarkable.   No more active bleeding -Monitor hemoglobin -No need for any colonoscopy has tested positive for C. difficile which can also cause diarrhea and bleeding  C. difficile colitis. C. difficile antigen and toxin positive, recent use of antibiotic.  Diarrhea improving -Started on deficit  Hypokalemia/dehydration - Improved  Leukocytosis - Improved, likely secondary to C. difficile  Essential hypertension Blood pressure currently within goal -Continue metoprolol .  Holding Norvasc  for now - IV as needed  Hyperlipidemia - Continue statin  DVT/PE - Eliquis  currently on hold.  History of CVA - Continue statin, holding Eliquis   Paroxysmal SVT - On metoprolol  and Eliquis  at home.  IV as needed  Seizure disorder - Vimpat , Keppra    Right upper lobe lung mass - Presumed malignancy, has opted not to pursue this  Prostate cancer status post prostatectomy  Glaucoma - Continue home meds and eyedrops   DVT prophylaxis: SCDs Start: 09/08/24 1844      Code Status: Full Code Family  Communication: Discussed with daughter on phone   PT Follow up Recs: PT and OT evaluation pending  Subjective: Patient was seen and examined today.  Had 1 episode of diarrhea.  No more bleeding.  Denies any abdominal pain.  Examination: General.  Frail elderly man, in no acute distress. Pulmonary.  Lungs clear bilaterally, normal respiratory effort. CV.  Regular rate and rhythm, no JVD, rub or murmur. Abdomen.  Soft, nontender, nondistended, BS positive. CNS.  Alert and oriented .  No focal neurologic deficit. Extremities.  No edema, pulses intact and symmetrical. Psychiatry.  Judgment and insight appears normal.     Diet Orders (From admission, onward)     Start     Ordered   09/10/24 0821  DIET SOFT Room service appropriate? Yes; Fluid consistency: Thin  Diet effective now       Question Answer Comment  Room service appropriate? Yes   Fluid consistency: Thin      09/10/24 0821            Objective: Vitals:   09/09/24 2039 09/10/24 0435 09/10/24 0807 09/10/24 1633  BP: 123/68 (!) 114/51 (!) 122/54 136/70  Pulse: 74 71 68 70  Resp: 18 18    Temp: 98.5 F (36.9 C) 98.7 F (37.1 C) (!) 97.5 F (36.4 C) 98.1 F (36.7 C)  TempSrc: Oral Oral Oral Oral  SpO2: 100% 100% 100% 100%    Intake/Output Summary (Last 24 hours) at 09/10/2024 1656 Last data filed at 09/10/2024 1600 Gross per 24 hour  Intake 1180.92 ml  Output 1550 ml  Net -369.08 ml   There were no vitals filed for this visit.  Scheduled Meds:  atorvastatin   40 mg Oral q1800   brimonidine   1 drop Both Eyes BID   dorzolamide -timolol   1 drop Both Eyes BID   fidaxomicin   200 mg Oral BID   lacosamide   100 mg Oral QHS   lacosamide   50 mg Oral Daily   latanoprost   1 drop Both Eyes QHS   levETIRAcetam   2,000 mg Oral BID   metoprolol  tartrate  12.5 mg Oral BID   sodium chloride  flush  3 mL Intravenous Q12H   Continuous Infusions:  Nutritional status     There is no height or weight on file to  calculate BMI.  Data Reviewed:   CBC: Recent Labs  Lab 09/08/24 1510 09/09/24 0436 09/10/24 0343  WBC 17.3* 12.8* 8.8  HGB 10.7* 9.7* 9.9*  HCT 33.9* 30.3* 30.5*  MCV 92.9 90.2 90.5  PLT 164 144* 150   Basic Metabolic Panel: Recent Labs  Lab 09/08/24 1510 09/08/24 2042 09/09/24 0436 09/10/24 0343  NA 137  --  136 134*  K 3.1*  --  3.5 3.8  CL 103  --  107 102  CO2 22  --  22 23  GLUCOSE 123*  --  86 110*  BUN 15  --  15 10  CREATININE 1.06  --  1.00 0.83  CALCIUM  8.9  --  8.6* 8.6*  MG  --  1.8  --  1.8   GFR: CrCl cannot be calculated (Unknown ideal weight.). Liver Function Tests: Recent Labs  Lab 09/08/24 1510 09/09/24 0436  AST 21 21  ALT 10 10  ALKPHOS 50 47  BILITOT 1.1 0.9  PROT 6.1* 5.5*  ALBUMIN 3.1* 2.6*   No results for input(s): LIPASE, AMYLASE in the last 168 hours. No results for input(s): AMMONIA in the last 168 hours. Coagulation Profile: No results for input(s): INR, PROTIME in the last 168 hours. Cardiac Enzymes: No results for input(s): CKTOTAL, CKMB, CKMBINDEX, TROPONINI in the last 168 hours. BNP (last 3 results) No results for input(s): PROBNP in the last 8760 hours. HbA1C: No results for input(s): HGBA1C in the last 72 hours. CBG: Recent Labs  Lab 09/08/24 1508  GLUCAP 112*   Lipid Profile: No results for input(s): CHOL, HDL, LDLCALC, TRIG, CHOLHDL, LDLDIRECT in the last 72 hours. Thyroid  Function Tests: No results for input(s): TSH, T4TOTAL, FREET4, T3FREE, THYROIDAB in the last 72 hours. Anemia Panel: No results for input(s): VITAMINB12, FOLATE, FERRITIN, TIBC, IRON, RETICCTPCT in the last 72 hours. Sepsis Labs: No results for input(s): PROCALCITON, LATICACIDVEN in the last 168 hours.  Recent Results (from the past 240 hours)  Gastrointestinal Panel by PCR , Stool     Status: None   Collection Time: 09/10/24 12:42 AM   Specimen: Stool  Result Value Ref Range  Status   Campylobacter species NOT DETECTED NOT DETECTED Final   Plesimonas shigelloides NOT DETECTED NOT DETECTED Final   Salmonella species NOT DETECTED NOT DETECTED Final   Yersinia enterocolitica NOT DETECTED NOT DETECTED Final   Vibrio species NOT DETECTED NOT DETECTED Final   Vibrio cholerae NOT DETECTED NOT DETECTED Final   Enteroaggregative E coli (EAEC) NOT DETECTED NOT DETECTED Final   Enteropathogenic E coli (EPEC) NOT DETECTED NOT DETECTED Final   Enterotoxigenic E coli (ETEC) NOT DETECTED NOT DETECTED Final   Shiga like toxin producing E coli (STEC) NOT DETECTED NOT DETECTED Final   Shigella/Enteroinvasive E coli (EIEC) NOT DETECTED NOT DETECTED Final   Cryptosporidium NOT DETECTED NOT DETECTED Final   Cyclospora cayetanensis NOT  DETECTED NOT DETECTED Final   Entamoeba histolytica NOT DETECTED NOT DETECTED Final   Giardia lamblia NOT DETECTED NOT DETECTED Final   Adenovirus F40/41 NOT DETECTED NOT DETECTED Final   Astrovirus NOT DETECTED NOT DETECTED Final   Norovirus GI/GII NOT DETECTED NOT DETECTED Final   Rotavirus A NOT DETECTED NOT DETECTED Final   Sapovirus (I, II, IV, and V) NOT DETECTED NOT DETECTED Final    Comment: Performed at [facility], [address] [place], [place] [address]  C Difficile Quick Screen w PCR reflex     Status: Abnormal   Collection Time: 09/10/24 12:42 AM   Specimen: Stool  Result Value Ref Range Status   C Diff antigen POSITIVE (A) NEGATIVE Final   C Diff toxin POSITIVE (A) NEGATIVE Final   C Diff interpretation Toxin producing C. difficile detected.  Final    Comment: 09/10/2024 0204  ROSS RN Performed at [facility], [address], [place], [place] [address]      Radiology Studies: No results found.    LOS: 0 days   Time spent= 45 mins  Amaryllis Dare, MD [facility]  If 7PM-7AM, please contact night-coverage  09/10/2024, 4:56 PM

## 2024-09-10 NOTE — Plan of Care (Signed)

## 2024-09-10 NOTE — Telephone Encounter (Signed)
 Patient Product/process development scientist completed.    The patient is insured through [facility]. Patient has Medicare and is not eligible for a copay card, but may be able to apply for patient assistance or Medicare RX Payment Plan (Patient Must reach out to their plan, if eligible for payment plan), if available.    Ran test claim for Dificid 200 mg and the current 10 day co-pay is $0.00.   This test claim was processed through [facility]- copay amounts may vary at other pharmacies due to pharmacy/plan contracts, or as the patient moves through the different stages of their insurance plan.     Morgan Arab, CPHT Pharmacy Technician III Certified Patient Advocate [facility] Patient Advocate Team Direct Number: [phone]  Fax: [phone]

## 2024-09-11 ENCOUNTER — Other Ambulatory Visit (HOSPITAL_COMMUNITY): Payer: Self-pay

## 2024-09-11 DIAGNOSIS — H409 Unspecified glaucoma: Secondary | ICD-10-CM | POA: Diagnosis not present

## 2024-09-11 DIAGNOSIS — I10 Essential (primary) hypertension: Secondary | ICD-10-CM | POA: Diagnosis not present

## 2024-09-11 DIAGNOSIS — A0472 Enterocolitis due to Clostridium difficile, not specified as recurrent: Secondary | ICD-10-CM | POA: Diagnosis not present

## 2024-09-11 DIAGNOSIS — I471 Supraventricular tachycardia, unspecified: Secondary | ICD-10-CM | POA: Diagnosis not present

## 2024-09-11 DIAGNOSIS — K625 Hemorrhage of anus and rectum: Secondary | ICD-10-CM | POA: Diagnosis not present

## 2024-09-11 DIAGNOSIS — J439 Emphysema, unspecified: Secondary | ICD-10-CM | POA: Diagnosis not present

## 2024-09-11 DIAGNOSIS — D72829 Elevated white blood cell count, unspecified: Secondary | ICD-10-CM | POA: Diagnosis not present

## 2024-09-11 LAB — BASIC METABOLIC PANEL WITH GFR
Anion gap: 9 (ref 5–15)
BUN: 8 mg/dL (ref 8–23)
CO2: 23 mmol/L (ref 22–32)
Calcium: 8.6 mg/dL — ABNORMAL LOW (ref 8.9–10.3)
Chloride: 102 mmol/L (ref 98–111)
Creatinine, Ser: 0.97 mg/dL (ref 0.61–1.24)
GFR, Estimated: >60 mL/min (ref >60)
Glucose, Bld: 94 mg/dL (ref 70–99)
Potassium: 3.9 mmol/L (ref 3.5–5.1)
Sodium: 134 mmol/L — ABNORMAL LOW (ref 135–145)

## 2024-09-11 LAB — CBC
HCT: 30.4 % — ABNORMAL LOW (ref 39.0–52.0)
Hemoglobin: 9.8 g/dL — ABNORMAL LOW (ref 13.0–17.0)
MCH: 29.3 pg (ref 26.0–34.0)
MCHC: 32.2 g/dL (ref 30.0–36.0)
MCV: 90.7 fL (ref 80.0–100.0)
Platelets: 145 K/uL — ABNORMAL LOW (ref 150–400)
RBC: 3.35 MIL/uL — ABNORMAL LOW (ref 4.22–5.81)
RDW: 13.8 % (ref 11.5–15.5)
WBC: 5.6 K/uL (ref 4.0–10.5)
nRBC: 0 % (ref 0.0–0.2)

## 2024-09-11 LAB — MAGNESIUM: Magnesium: 1.6 mg/dL — ABNORMAL LOW (ref 1.7–2.4)

## 2024-09-11 MED ORDER — MAGNESIUM SULFATE 4 GM/100ML IV SOLN
4.0000 g | Freq: Once | INTRAVENOUS | Status: AC
  Administered 2024-09-11: 4 g via INTRAVENOUS
  Filled 2024-09-11: qty 100

## 2024-09-11 MED ORDER — FIDAXOMICIN 200 MG PO TABS
200.0000 mg | ORAL_TABLET | Freq: Two times a day (BID) | ORAL | 0 refills | Status: AC
  Filled 2024-09-11: qty 16, 8d supply, fill #0

## 2024-09-11 NOTE — Discharge Summary (Signed)
 Physician Discharge Summary   Patient: Travis Salazar. MRN: [id number] DOB: [birth year]  Admit date:     09/08/2024  Discharge date: 09/11/24  Discharge Physician: Amaryllis Dare   PCP: Sharma Coyer, MD   Recommendations at discharge:  Please obtain CBC and BMP on follow-up Follow-up with primary care provider Please ensure he completes the course of deficit Follow-up with gastroenterology  Discharge Diagnoses: Principal Problem:   Bright red blood per rectum Active Problems:   S/P prostatectomy   History of CVA (cerebrovascular accident)   Paroxysmal supraventricular tachycardia   CAD (coronary artery disease)   Emphysema lung (HCC)   Seizure disorder (HCC)   Essential hypertension   Idiopathic chronic gout of multiple sites without tophus   Glaucoma (increased eye pressure)   Leukocytosis   Hypokalemia   C. difficile colitis  Hospital Course: Brief Narrative:   88 year old with history of HTN, HLD, CVA, paroxysmal SVT, CAD, seizure, right upper lobe mass, emphysema, prostate cancer status post prostatectomy, gout, glaucoma, DVT/PE presenting with worsening , fatigue and bright red blood per rectum.  Recently was at [facility] long for dehydration and UTI for which she was prescribed Keflex .  [facility] consulted.  9/26: C. difficile antigen and toxin positive so started on Dificid .  No need for any colonoscopy and hemoglobin stable.  Ordered PT and OT evaluation.  9/27: Remained hemodynamically stable.  Mild hypomagnesemia which was repleted.  No more diarrhea.  No abdominal pain.  Hemoglobin stable and no concern of GI bleed at this time.  Patient will resume his home Eliquis  on discharge.  He is being given 8 more days of Dificid  to complete a 10-day course for C. difficile colitis.  Patient will follow-up with gastroenterology as outpatient for further determination when he will need colonoscopy.  PT and OT are recommending home health which is ordered.  Patient will  continue the rest of his home medications, care was discussed with his daughter who is also his caregiver and he will follow-up with his providers for further assistance.  Patient remained high risk for mortality and readmission based on advanced age and significant underlying comorbidities.  Assessment & Plan:  Rectal bleeding -Baseline hemoglobin around 11.0, admission hemoglobin 10.7 slowly trending downwards likely from hydration/dilution.  Today at 9.8.  Colonoscopy in 2012 showed medium sized lipoma in the transverse colon otherwise unremarkable.   No more active bleeding -Monitor hemoglobin -No need for any colonoscopy has tested positive for C. difficile which can also cause diarrhea and bleeding  C. difficile colitis. C. difficile antigen and toxin positive, recent use of antibiotic.  Diarrhea improved -Started on deficit-need to complete a 10-day course  Hypokalemia/dehydration - Improved  Leukocytosis - Improved, likely secondary to C. difficile  Essential hypertension Blood pressure currently within goal -Continue metoprolol .  Holding Norvasc  for now - IV as needed  Hyperlipidemia - Continue statin  DVT/PE - Patient will resume Eliquis  on discharge  History of CVA - Continue statin, will resume Eliquis  on discharge  Paroxysmal SVT - On metoprolol  and Eliquis  at home.    Seizure disorder - Vimpat , Keppra    Right upper lobe lung mass - Presumed malignancy, has opted not to pursue this  Prostate cancer status post prostatectomy  Glaucoma - Continue home meds and eyedrops  Assessment and Plan: No notes have been filed under this hospital service. Service: Hospitalist  Consultants: Gastroenterology Procedures performed: None Disposition: Home health Diet recommendation:  Discharge Diet Orders (From admission, onward)     Start  Ordered   09/11/24 0000  Diet - low sodium heart healthy        09/11/24 1357           Cardiac diet DISCHARGE  MEDICATION: Allergies as of 09/11/2024   No Known Allergies      Medication List     STOP taking these medications    cephALEXin  500 MG capsule Commonly known as: KEFLEX        TAKE these medications    acetaminophen  500 MG tablet Commonly known as: TYLENOL  Take 500 mg by mouth daily as needed (back pain).   amLODipine  10 MG tablet Commonly known as: NORVASC  Take 1 tablet (10 mg total) by mouth daily.   apixaban  5 MG Tabs tablet Commonly known as: Eliquis  Take 1 tablet (5 mg total) by mouth 2 (two) times daily.   atorvastatin  40 MG tablet Commonly known as: LIPITOR TAKE 1 TABLET BY MOUTH DAILY AT  6 PM   brimonidine  0.2 % ophthalmic solution Commonly known as: ALPHAGAN  Place 1 drop into both eyes 2 (two) times daily.   dorzolamide -timolol  2-0.5 % ophthalmic solution Commonly known as: COSOPT  Place 1 drop into both eyes 2 (two) times daily.   fidaxomicin  200 MG Tabs tablet Commonly known as: DIFICID  Take 1 tablet (200 mg total) by mouth 2 (two) times daily for 8 days.   lacosamide  50 MG Tabs tablet Commonly known as: VIMPAT  Take 1 tablet every morning What changed:  how much to take how to take this when to take this additional instructions   Lacosamide  100 MG Tabs Take 1 tablet every night What changed:  how much to take how to take this when to take this additional instructions   levETIRAcetam  1000 MG tablet Commonly known as: KEPPRA  Take 2 tablets (2,000 mg total) by mouth 2 (two) times daily.   metoprolol  tartrate 25 MG tablet Commonly known as: LOPRESSOR  TAKE ONE-HALF TABLET BY MOUTH  TWICE DAILY   Valtoco  15 MG Dose 7.5 MG/0.1ML Lqpk Generic drug: diazePAM  (15 MG Dose) Spray in each nostril as needed for seizure. May use second dose after 4 hours if needed.   Vyzulta 0.024 % Soln Generic drug: Latanoprostene Bunod Apply 1 drop to eye at bedtime.        Follow-up Information     Simmons-Robinson, Makiera, MD. Schedule an  appointment as soon as possible for a visit in 1 week(s).   Specialty: Family Medicine Contact information: [address] [place] [address] [phone]         Albertus Gordy HERO, MD. Schedule an appointment as soon as possible for a visit in 1 week(s).   Specialty: Gastroenterology Contact information: [address] [place] [address] [phone]         Ricci Nat Bouchard .   Specialty: Vascular Surgery Contact information: [address] [place] [address] [phone]                Discharge Exam: There were no vitals filed for this visit. General.  Frail elderly man, in no acute distress. Pulmonary.  Lungs clear bilaterally, normal respiratory effort. CV.  Regular rate and rhythm, no JVD, rub or murmur. Abdomen.  Soft, nontender, nondistended, BS positive. CNS.  Alert and oriented .  No focal neurologic deficit. Extremities.  No edema, no cyanosis, pulses intact and symmetrical.  Condition at discharge: stable  The results of significant diagnostics from this hospitalization (including imaging, microbiology, ancillary and laboratory) are listed below for  reference.   Imaging Studies: CT ABDOMEN PELVIS W CONTRAST Result Date: 09/01/2024 CLINICAL DATA:  Metastatic prostate carcinoma. * Tracking Code: BO * EXAM: CT ABDOMEN AND PELVIS WITH CONTRAST TECHNIQUE: Multidetector CT imaging of the abdomen and pelvis was performed using the standard protocol following bolus administration of intravenous contrast. RADIATION DOSE REDUCTION: This exam was performed according to the departmental dose-optimization program which includes automated exposure control, adjustment of the mA and/or kV according to patient size and/or use of iterative reconstruction technique. CONTRAST:  OMNIPAQUE  IOHEXOL  300 MG/ML  SOLN COMPARISON:  01/21/2022 FINDINGS: Lower Chest: No acute findings. Stable bilateral lower lobe pleural-parenchymal  scarring. Hepatobiliary: A few tiny sub-cm cysts are again seen in the right and left lobes. 1.5 cm hypervascular lesion in the posterior dome of the right hepatic lobe is stable, consistent with benign etiology such as flash-filling hemangioma or focal nodular hyperplasia. No new or enlarging liver masses identified. Gallbladder is unremarkable. No evidence of biliary ductal dilatation. Pancreas: 1.3 cm simple appearing cystic lesion in the pancreatic tail remains stable. No other pancreatic lesions identified. No evidence of pancreatic ductal dilatation. Spleen: Within normal limits in size and appearance. Adrenals/Urinary Tract: Several benign-appearing right renal cysts are again seen (No followup imaging is recommended) . No suspicious masses identified. No evidence of ureteral calculi or hydronephrosis. Unremarkable unopacified urinary bladder. Stomach/Bowel: No evidence of obstruction, inflammatory process or abnormal fluid collections. Vascular/Lymphatic: No pathologically enlarged lymph nodes. No acute vascular findings. Reproductive: Prior prostatectomy again noted. No mass seen within the prostatectomy bed. Other:  None. Musculoskeletal: Mixed lytic and sclerotic bone lesion in the L5 vertebral body appears stable. New small sclerotic bone lesions in the L3 and L4 vertebral bodies and right ilium are seen. IMPRESSION: Small sclerotic bone metastases in the L3 and L4 vertebral bodies and right ilium. This may represent therapeutic response of previously occult small bone metastases, although new bone metastases cannot definitely be excluded. Stable L5 vertebral body metastasis. No evidence of lymphadenopathy or other soft tissue metastatic disease within the abdomen or pelvis. Stable 1.3 cm simple appearing cystic lesion in pancreatic tail, likely representing an indolent cystic neoplasm such as a side-branch IPMN. Recommend continued attention on follow-up imaging. Electronically Signed   By: Norleen DELENA Kil  M.D.   On: 09/01/2024 10:16   NM Bone Scan Whole Body Result Date: 08/31/2024 CLINICAL DATA:  Prostate cancer.  Recent fall.  Leg injury. EXAM: NUCLEAR MEDICINE WHOLE BODY BONE SCAN TECHNIQUE: Whole body anterior and posterior images were obtained approximately 3 hours after intravenous injection of radiopharmaceutical. RADIOPHARMACEUTICALS:  18.6 mCi Technetium-38m MDP IV COMPARISON:  01/21/2022 FINDINGS: Intense skeletal uptake in the proximal LEFT tibial metaphysis. Mild uptake in the lower lumbar spine is similar comparison exams. Mild uptake at the RIGHT sternoclavicular junction. IMPRESSION: 1. Intense uptake in the proximal LEFT tibia. Recommend correlation with prior reported leg trauma. Consider plain film of the LEFT tibia. 2. Degenerative uptake in the lumbar spine medial RIGHT clavicle. Electronically Signed   By: Jackquline Boxer M.D.   On: 08/31/2024 12:49   DG Chest Portable 1 View Result Date: 08/30/2024 CLINICAL DATA:  Shortness of breath. EXAM: PORTABLE CHEST 1 VIEW COMPARISON:  04/10/2023. FINDINGS: Bilateral lung fields are clear. Bilateral costophrenic angles are clear. Normal cardio-mediastinal silhouette. No acute osseous abnormalities. The soft tissues are within normal limits. IMPRESSION: No active disease. Electronically Signed   By: Ree Molt M.D.   On: 08/30/2024 16:30    Microbiology: Results for orders placed  or performed during the hospital encounter of 09/08/24  Gastrointestinal Panel by PCR , Stool     Status: None   Collection Time: 09/10/24 12:42 AM   Specimen: Stool  Result Value Ref Range Status   Campylobacter species NOT DETECTED NOT DETECTED Final   Plesimonas shigelloides NOT DETECTED NOT DETECTED Final   Salmonella species NOT DETECTED NOT DETECTED Final   Yersinia enterocolitica NOT DETECTED NOT DETECTED Final   Vibrio species NOT DETECTED NOT DETECTED Final   Vibrio cholerae NOT DETECTED NOT DETECTED Final   Enteroaggregative E coli (EAEC) NOT  DETECTED NOT DETECTED Final   Enteropathogenic E coli (EPEC) NOT DETECTED NOT DETECTED Final   Enterotoxigenic E coli (ETEC) NOT DETECTED NOT DETECTED Final   Shiga like toxin producing E coli (STEC) NOT DETECTED NOT DETECTED Final   Shigella/Enteroinvasive E coli (EIEC) NOT DETECTED NOT DETECTED Final   Cryptosporidium NOT DETECTED NOT DETECTED Final   Cyclospora cayetanensis NOT DETECTED NOT DETECTED Final   Entamoeba histolytica NOT DETECTED NOT DETECTED Final   Giardia lamblia NOT DETECTED NOT DETECTED Final   Adenovirus F40/41 NOT DETECTED NOT DETECTED Final   Astrovirus NOT DETECTED NOT DETECTED Final   Norovirus GI/GII NOT DETECTED NOT DETECTED Final   Rotavirus A NOT DETECTED NOT DETECTED Final   Sapovirus (I, II, IV, and V) NOT DETECTED NOT DETECTED Final    Comment: Performed at [facility], [address] [place], [place] [address]  C Difficile Quick Screen w PCR reflex     Status: Abnormal   Collection Time: 09/10/24 12:42 AM   Specimen: Stool  Result Value Ref Range Status   C Diff antigen POSITIVE (A) NEGATIVE Final   C Diff toxin POSITIVE (A) NEGATIVE Final   C Diff interpretation Toxin producing C. difficile detected.  Final    Comment: 09/10/2024 0204  ROSS RN Performed at [facility], [address], [place], [place] [address]     Labs: CBC: Recent Labs  Lab 09/08/24 1510 09/09/24 0436 09/10/24 0343 09/11/24 0315  WBC 17.3* 12.8* 8.8 5.6  HGB 10.7* 9.7* 9.9* 9.8*  HCT 33.9* 30.3* 30.5* 30.4*  MCV 92.9 90.2 90.5 90.7  PLT 164 144* 150 145*   Basic Metabolic Panel: Recent Labs  Lab 09/08/24 1510 09/08/24 2042 09/09/24 0436 09/10/24 0343 09/11/24 0315  NA 137  --  136 134* 134*  K 3.1*  --  3.5 3.8 3.9  CL 103  --  107 102 102  CO2 22  --  22 23 23   GLUCOSE 123*  --  86 110* 94  BUN 15  --  15 10 8   CREATININE 1.06  --  1.00 0.83 0.97  CALCIUM  8.9  --  8.6* 8.6* 8.6*  MG  --  1.8  --  1.8 1.6*   Liver Function Tests: Recent  Labs  Lab 09/08/24 1510 09/09/24 0436  AST 21 21  ALT 10 10  ALKPHOS 50 47  BILITOT 1.1 0.9  PROT 6.1* 5.5*  ALBUMIN 3.1* 2.6*   CBG: Recent Labs  Lab 09/08/24 1508  GLUCAP 112*    Discharge time spent: greater than 30 minutes.  This record has been created using Conservation officer, historic buildings. Errors have been sought and corrected,but may not always be located. Such creation errors do not reflect on the standard of care.   Signed: Amaryllis Dare, MD [facility] 09/11/2024

## 2024-09-11 NOTE — Plan of Care (Signed)
  Problem: Clinical Measurements: Goal: Ability to maintain clinical measurements within normal limits will improve Outcome: Progressing   Problem: Nutrition: Goal: Adequate nutrition will be maintained Outcome: Progressing   Problem: Coping: Goal: Level of anxiety will decrease Outcome: Progressing   

## 2024-09-11 NOTE — Progress Notes (Signed)
 OT Cancellation Note  Patient Details Name: Travis Salazar. MRN: [id number] DOB: 1936-04-20   Cancelled Treatment:    Reason Eval/Treat Not Completed: Other (comment).  Note patient with imminent discharge planning to discharge soon.  HHOT already setup by [facility] team.  Will defer OT eval to them.    Alvester Eads OTR/L 09/11/2024, 2:31 PM

## 2024-09-11 NOTE — TOC Transition Note (Signed)
 Transition of Care [facility]) - Discharge Note   Patient Details  Name: Travis Salazar. MRN: [id number] Date of Birth: December 20, 1935  [facility] CM/SW Contact:  Robynn Eileen Hoose, RN Phone Number: 09/11/2024, 2:08 PM   Clinical Narrative:   Secure message from provider regarding pt having orders for [facility] services. Patient has used [place] multiple times in the past. Darleene with [facility] able to accept pt for [facility] PT/OT as ordered. Rolling walker recommendations noted, ordered through Jermaine with [employer] to be delivered to bedside.    Final next level of care: Home w Home Health Services Barriers to Discharge: No Barriers Identified   Patient Goals and CMS Choice            Discharge Placement                       Discharge Plan and Services Additional resources added to the After Visit Summary for                  DME Arranged: Walker rolling DME Agency: [employer] Date DME Agency Contacted: 09/11/24 Time DME Agency Contacted: 1406 Representative spoke with at DME Agency: [place] HH Arranged: PT, OT HH Agency: [facility] Date [facility] Agency Contacted: 09/11/24 Time HH Agency Contacted: 1404 Representative spoke with at [facility] Agency: Darleene  Social Drivers of Health (SDOH) Interventions SDOH Screenings   Food Insecurity: No Food Insecurity (09/09/2024)  Housing: Low Risk  (09/09/2024)  Transportation Needs: No Transportation Needs (09/09/2024)  Utilities: Not At Risk (09/09/2024)  Alcohol Screen: Low Risk  (04/16/2024)  Depression (PHQ2-9): Low Risk  (04/19/2024)  Financial Resource Strain: Low Risk  (06/29/2024)  Physical Activity: Inactive (06/29/2024)  Social Connections: Socially Isolated (09/09/2024)  Stress: No Stress Concern Present (06/29/2024)  Tobacco Use: Medium Risk (09/08/2024)  Health Literacy: Adequate Health Literacy (04/19/2024)     Readmission Risk Interventions     No data to display

## 2024-09-11 NOTE — Evaluation (Signed)
 Physical Therapy Evaluation Patient Details Name: Travis Salazar. MRN: [id number] DOB: Apr 26, 1936 Today's Date: 09/11/2024  History of Present Illness  88 year old presenting with worsening, fatigue and bright red blood per rectum.  Recently was at [facility] long for dehydration and UTI. C. difficile antigen and toxin positive. Past medical history of HTN, HLD, CVA, paroxysmal SVT, CAD, seizure, right upper lobe mass, emphysema, prostate cancer status post prostatectomy, gout, glaucoma, DVT/PE.  Clinical Impression  Pt presents with admitting diagnosis above. Pt noted with significant visual deficits during mobility which pt states is baseline for him. Pt required constant tactile cues for mobility and constant cues for navigation obstacles with up to Min A. Pt states that he typically uses SPC at home and was not used to the RW. PTA pt was mostly Mod I with SPC however daughter helps a little with ADLs. Recommend HHPT upon DC. PT will continue to follow.         If plan is discharge home, recommend the following: A little help with walking and/or transfers;A little help with bathing/dressing/bathroom;Assistance with cooking/housework;Direct supervision/assist for medications management;Assist for transportation;Help with stairs or ramp for entrance;Supervision due to cognitive status   Can travel by private vehicle        Equipment Recommendations Rolling walker (2 wheels)  Recommendations for Other Services       Functional Status Assessment Patient has had a recent decline in their functional status and demonstrates the ability to make significant improvements in function in a reasonable and predictable amount of time.     Precautions / Restrictions Precautions Precautions: Fall;Other (comment) Recall of Precautions/Restrictions: Intact Precaution/Restrictions Comments: Enteric for C diff Restrictions Weight Bearing Restrictions Per Provider Order: No      Mobility  Bed Mobility                General bed mobility comments: Seated EOB    Transfers Overall transfer level: Needs assistance Equipment used: Rolling walker (2 wheels) Transfers: Sit to/from Stand Sit to Stand: Contact guard assist           General transfer comment: Cues for hand placement.    Ambulation/Gait Ambulation/Gait assistance: Contact guard assist, Min assist Gait Distance (Feet): 75 Feet Assistive device: Rolling walker (2 wheels) Gait Pattern/deviations: Drifts right/left, Trunk flexed, Decreased stride length, Step-through pattern Gait velocity: decreased     General Gait Details: Pt noted with significant visual deficits during mobility which pt states is baseline for him. Pt required constant tactile cues for mobility and constant cues for navigation obstacles with up to Min A. Pt states that he typically uses SPC at home and was not used to the RW.  Stairs            Wheelchair Mobility     Tilt Bed    Modified Rankin (Stroke Patients Only)       Balance Overall balance assessment: Needs assistance Sitting-balance support: Bilateral upper extremity supported, Feet supported Sitting balance-Leahy Scale: Good     Standing balance support: Bilateral upper extremity supported, During functional activity Standing balance-Leahy Scale: Fair Standing balance comment: Benefits from external support                             Pertinent Vitals/Pain Pain Assessment Pain Assessment: No/denies pain    Home Living Family/patient expects to be discharged to:: Private residence Living Arrangements: Children;Other relatives (Daughter and grandson) Available Help at Discharge: Family;Available 24 hours/day Type of Home:  House Home Access: Stairs to enter Entrance Stairs-Rails: None Entrance Stairs-Number of Steps: 1   Home Layout: One level Home Equipment: Shower seat - built in;Cane - single point;Grab bars - tub/shower;Grab bars - toilet;Hand  held shower head      Prior Function Prior Level of Function : Independent/Modified Independent             Mobility Comments: Mod I with a SPC/walking stick ADLs Comments: Pt reports a little help from daughter     Extremity/Trunk Assessment   Upper Extremity Assessment Upper Extremity Assessment: Overall WFL for tasks assessed    Lower Extremity Assessment Lower Extremity Assessment: Overall WFL for tasks assessed    Cervical / Trunk Assessment Cervical / Trunk Assessment: Kyphotic  Communication   Communication Communication: No apparent difficulties    Cognition Arousal: Alert Behavior During Therapy: WFL for tasks assessed/performed   PT - Cognitive impairments: Sequencing, Initiation, Problem solving, Safety/Judgement                       PT - Cognition Comments: Pt was A&Ox4 however noted with some difficulty sequencing and problem during ambulation. Following commands: Intact       Cueing Cueing Techniques: Verbal cues, Tactile cues     General Comments General comments (skin integrity, edema, etc.): VSS    Exercises     Assessment/Plan    PT Assessment Patient needs continued PT services  PT Problem List Decreased strength;Decreased range of motion;Decreased activity tolerance;Decreased balance;Decreased mobility;Decreased coordination;Decreased cognition;Decreased knowledge of use of DME;Decreased safety awareness;Decreased knowledge of precautions;Cardiopulmonary status limiting activity       PT Treatment Interventions DME instruction;Gait training;Stair training;Functional mobility training;Therapeutic activities;Balance training;Therapeutic exercise;Neuromuscular re-education;Cognitive remediation;Patient/family education    PT Goals (Current goals can be found in the Care Plan section)  Acute Rehab PT Goals Patient Stated Goal: to go home PT Goal Formulation: With patient Time For Goal Achievement: 09/25/24 Potential to Achieve  Goals: Fair    Frequency Min 2X/week     Co-evaluation               AM-PAC PT 6 Clicks Mobility  Outcome Measure Help needed turning from your back to your side while in a flat bed without using bedrails?: A Little Help needed moving from lying on your back to sitting on the side of a flat bed without using bedrails?: A Little Help needed moving to and from a bed to a chair (including a wheelchair)?: A Little Help needed standing up from a chair using your arms (e.g., wheelchair or bedside chair)?: A Little Help needed to walk in hospital room?: A Little Help needed climbing 3-5 steps with a railing? : A Little 6 Click Score: 18    End of Session Equipment Utilized During Treatment: Gait belt Activity Tolerance: Patient tolerated treatment well Patient left: in bed;with call bell/phone within reach Nurse Communication: Mobility status PT Visit Diagnosis: Other abnormalities of gait and mobility (R26.89)    Time: 8881-8858 PT Time Calculation (min) (ACUTE ONLY): 23 min   Charges:   PT Evaluation $PT Eval Moderate Complexity: 1 Mod PT Treatments $Gait Training: 8-22 mins PT General Charges $$ ACUTE PT VISIT: 1 Visit         Sueellen NOVAK, PT, DPT Acute Rehab Services [phone]   Jeanny Rymer 09/11/2024, 12:09 PM

## 2024-09-11 NOTE — Care Management (Cosign Needed)
    Durable Medical Equipment  (From admission, onward)           Start     Ordered   09/11/24 1412  For home use only DME Walker rolling  Once       Question Answer Comment  Walker: With 5 Inch Wheels   Patient needs a walker to treat with the following condition Weakness      09/11/24 1412           Per PT recommendations

## 2024-09-11 NOTE — Progress Notes (Signed)
 Patient has received his discharge papers and is currently waiting for his daughter to pick him up.

## 2024-09-13 ENCOUNTER — Encounter: Payer: Self-pay | Admitting: Gastroenterology

## 2024-09-13 ENCOUNTER — Telehealth: Payer: Self-pay

## 2024-09-13 DIAGNOSIS — E876 Hypokalemia: Secondary | ICD-10-CM | POA: Diagnosis not present

## 2024-09-13 DIAGNOSIS — J439 Emphysema, unspecified: Secondary | ICD-10-CM | POA: Diagnosis not present

## 2024-09-13 DIAGNOSIS — K625 Hemorrhage of anus and rectum: Secondary | ICD-10-CM | POA: Diagnosis not present

## 2024-09-13 DIAGNOSIS — Z86711 Personal history of pulmonary embolism: Secondary | ICD-10-CM | POA: Diagnosis not present

## 2024-09-13 DIAGNOSIS — I10 Essential (primary) hypertension: Secondary | ICD-10-CM | POA: Diagnosis not present

## 2024-09-13 DIAGNOSIS — E785 Hyperlipidemia, unspecified: Secondary | ICD-10-CM | POA: Diagnosis not present

## 2024-09-13 DIAGNOSIS — I2510 Atherosclerotic heart disease of native coronary artery without angina pectoris: Secondary | ICD-10-CM | POA: Diagnosis not present

## 2024-09-13 DIAGNOSIS — Z87891 Personal history of nicotine dependence: Secondary | ICD-10-CM | POA: Diagnosis not present

## 2024-09-13 DIAGNOSIS — M1A09X0 Idiopathic chronic gout, multiple sites, without tophus (tophi): Secondary | ICD-10-CM | POA: Diagnosis not present

## 2024-09-13 DIAGNOSIS — N390 Urinary tract infection, site not specified: Secondary | ICD-10-CM | POA: Diagnosis not present

## 2024-09-13 DIAGNOSIS — Z8673 Personal history of transient ischemic attack (TIA), and cerebral infarction without residual deficits: Secondary | ICD-10-CM | POA: Diagnosis not present

## 2024-09-13 DIAGNOSIS — Z9181 History of falling: Secondary | ICD-10-CM | POA: Diagnosis not present

## 2024-09-13 DIAGNOSIS — D72829 Elevated white blood cell count, unspecified: Secondary | ICD-10-CM | POA: Diagnosis not present

## 2024-09-13 DIAGNOSIS — Z86718 Personal history of other venous thrombosis and embolism: Secondary | ICD-10-CM | POA: Diagnosis not present

## 2024-09-13 DIAGNOSIS — I471 Supraventricular tachycardia, unspecified: Secondary | ICD-10-CM | POA: Diagnosis not present

## 2024-09-13 DIAGNOSIS — E860 Dehydration: Secondary | ICD-10-CM | POA: Diagnosis not present

## 2024-09-13 DIAGNOSIS — Z7901 Long term (current) use of anticoagulants: Secondary | ICD-10-CM | POA: Diagnosis not present

## 2024-09-13 DIAGNOSIS — I350 Nonrheumatic aortic (valve) stenosis: Secondary | ICD-10-CM | POA: Diagnosis not present

## 2024-09-13 DIAGNOSIS — H4020X3 Unspecified primary angle-closure glaucoma, severe stage: Secondary | ICD-10-CM | POA: Diagnosis not present

## 2024-09-13 NOTE — Transitions of Care (Post Inpatient/ED Visit) (Signed)
 09/13/2024  Name: Travis Salazar. MRN: [id number] DOB: [birth year]  Today's TOC FU Call Status: Today's TOC FU Call Status:: Successful TOC FU Call Completed TOC FU Call Complete Date: 09/13/24 Patient's Name and Date of Birth confirmed.  Transition Care Management Follow-up Telephone Call Date of Discharge: 09/11/24 Discharge Facility: [facility]) Type of Discharge: Inpatient Admission Primary Inpatient Discharge Diagnosis:: blood per rectum How have you been since you were released from the hospital?: Better Any questions or concerns?: No  Items Reviewed: Did you receive and understand the discharge instructions provided?: Yes Medications obtained,verified, and reconciled?: Yes (Medications Reviewed) Any new allergies since your discharge?: No Dietary orders reviewed?: Yes Do you have support at home?: Yes People in Home [RPT]: child(ren), adult  Medications Reviewed Today: Medications Reviewed Today     Reviewed by Emmitt Pan, LPN (Licensed Practical Nurse) on 09/13/24 at 1434  Med List Status: <None>   Medication Order Taking? Sig Documenting Provider Last Dose Status Informant  acetaminophen  (TYLENOL ) 500 MG tablet [id number] Yes Take 500 mg by mouth daily as needed (back pain). [provider]  Active Pharmacy Records, Self  amLODipine  (NORVASC ) 10 MG tablet [id number] Yes Take 1 tablet (10 mg total) by mouth daily. Simmons-Robinson, Makiera, MD  Active Self, Pharmacy Records  apixaban  (ELIQUIS ) 5 MG TABS tablet [id number] Yes Take 1 tablet (5 mg total) by mouth 2 (two) times daily. Simmons-Robinson, Rockie, MD  Active Self, Pharmacy Records  atorvastatin  (LIPITOR) 40 MG tablet [id number] Yes TAKE 1 TABLET BY MOUTH DAILY AT  6 PM Simmons-Robinson, Makiera, MD  Active Self, Pharmacy Records  brimonidine  (ALPHAGAN ) 0.2 % ophthalmic solution [id number] Yes Place 1 drop into both eyes 2 (two) times daily. [provider]  Active Pharmacy Records, Self   diazePAM , 15 MG Dose, (VALTOCO  15 MG DOSE) 2 x 7.5 MG/0.1ML LQPK [id number] Yes Spray in each nostril as needed for seizure. May use second dose after 4 hours if needed. Georjean Darice HERO, MD  Active Pharmacy Records, Self  dorzolamide -timolol  (COSOPT ) 2-0.5 % ophthalmic solution [id number] Yes Place 1 drop into both eyes 2 (two) times daily. [provider]  Active Self, Pharmacy Records  fidaxomicin  (DIFICID ) 200 MG TABS tablet [id number] Yes Take 1 tablet (200 mg total) by mouth 2 (two) times daily for 8 days. Caleen Qualia, MD  Active   lacosamide  (VIMPAT ) 50 MG TABS tablet [id number] Yes Take 1 tablet every morning  Patient taking differently: Take 50 mg by mouth daily.   Georjean Darice HERO, MD  Active Self, Pharmacy Records  Lacosamide  100 MG TABS [id number] Yes Take 1 tablet every night  Patient taking differently: Take 1 tablet by mouth every evening.   Georjean Darice HERO, MD  Active Self, Pharmacy Records  levETIRAcetam  (KEPPRA ) 1000 MG tablet [id number] Yes Take 2 tablets (2,000 mg total) by mouth 2 (two) times daily. Georjean Darice HERO, MD  Active Self, Pharmacy Records  metoprolol  tartrate (LOPRESSOR ) 25 MG tablet [id number] Yes TAKE ONE-HALF TABLET BY MOUTH  TWICE DAILY Gasper Nancyann BRAVO, MD  Active Self, Pharmacy Records  VYZULTA 0.024 % SOLN [id number] Yes Apply 1 drop to eye at bedtime. [provider]  Active Self, Pharmacy Records            Home Care and Equipment/Supplies: Were Home Health Services Ordered?: Yes Name of Home Health Agency:: [facility] Has Agency set up a time to come to your home?: Yes First Home Health Visit Date: 09/13/24 Any new equipment or medical supplies ordered?:  Yes Name of Medical supply agency?: unknown Were you able to get the equipment/medical supplies?: Yes Do you have any questions related to the use of the equipment/supplies?: No  Functional Questionnaire: Do you need assistance with bathing/showering or dressing?: Yes Do you need  assistance with meal preparation?: Yes Do you need assistance with eating?: No Do you have difficulty maintaining continence: No Do you need assistance with getting out of bed/getting out of a chair/moving?: No Do you have difficulty managing or taking your medications?: Yes  Follow up appointments reviewed: PCP Follow-up appointment confirmed?: Yes Date of PCP follow-up appointment?: 09/23/24 Follow-up Provider: [facility] Follow-up appointment confirmed?: Yes Date of Specialist follow-up appointment?: 10/04/24 Follow-Up Specialty Provider:: GI Do you need transportation to your follow-up appointment?: No Do you understand care options if your condition(s) worsen?: Yes-patient verbalized understanding    SIGNATURE Julian Lemmings, LPN [facility] Nurse Health Advisor Direct Dial [phone]

## 2024-09-14 DIAGNOSIS — I471 Supraventricular tachycardia, unspecified: Secondary | ICD-10-CM | POA: Diagnosis not present

## 2024-09-14 DIAGNOSIS — H4020X3 Unspecified primary angle-closure glaucoma, severe stage: Secondary | ICD-10-CM | POA: Diagnosis not present

## 2024-09-14 DIAGNOSIS — K625 Hemorrhage of anus and rectum: Secondary | ICD-10-CM | POA: Diagnosis not present

## 2024-09-14 DIAGNOSIS — Z86711 Personal history of pulmonary embolism: Secondary | ICD-10-CM | POA: Diagnosis not present

## 2024-09-14 DIAGNOSIS — E860 Dehydration: Secondary | ICD-10-CM | POA: Diagnosis not present

## 2024-09-14 DIAGNOSIS — J439 Emphysema, unspecified: Secondary | ICD-10-CM | POA: Diagnosis not present

## 2024-09-14 DIAGNOSIS — D72829 Elevated white blood cell count, unspecified: Secondary | ICD-10-CM | POA: Diagnosis not present

## 2024-09-14 DIAGNOSIS — I2510 Atherosclerotic heart disease of native coronary artery without angina pectoris: Secondary | ICD-10-CM | POA: Diagnosis not present

## 2024-09-14 DIAGNOSIS — E876 Hypokalemia: Secondary | ICD-10-CM | POA: Diagnosis not present

## 2024-09-14 DIAGNOSIS — Z8673 Personal history of transient ischemic attack (TIA), and cerebral infarction without residual deficits: Secondary | ICD-10-CM | POA: Diagnosis not present

## 2024-09-14 DIAGNOSIS — E785 Hyperlipidemia, unspecified: Secondary | ICD-10-CM | POA: Diagnosis not present

## 2024-09-14 DIAGNOSIS — I10 Essential (primary) hypertension: Secondary | ICD-10-CM | POA: Diagnosis not present

## 2024-09-14 DIAGNOSIS — Z7901 Long term (current) use of anticoagulants: Secondary | ICD-10-CM | POA: Diagnosis not present

## 2024-09-14 DIAGNOSIS — M1A09X0 Idiopathic chronic gout, multiple sites, without tophus (tophi): Secondary | ICD-10-CM | POA: Diagnosis not present

## 2024-09-14 DIAGNOSIS — Z87891 Personal history of nicotine dependence: Secondary | ICD-10-CM | POA: Diagnosis not present

## 2024-09-14 DIAGNOSIS — Z9181 History of falling: Secondary | ICD-10-CM | POA: Diagnosis not present

## 2024-09-14 DIAGNOSIS — Z86718 Personal history of other venous thrombosis and embolism: Secondary | ICD-10-CM | POA: Diagnosis not present

## 2024-09-14 DIAGNOSIS — I350 Nonrheumatic aortic (valve) stenosis: Secondary | ICD-10-CM | POA: Diagnosis not present

## 2024-09-14 DIAGNOSIS — N390 Urinary tract infection, site not specified: Secondary | ICD-10-CM | POA: Diagnosis not present

## 2024-09-17 ENCOUNTER — Other Ambulatory Visit: Payer: Self-pay | Admitting: Family Medicine

## 2024-09-17 DIAGNOSIS — E7800 Pure hypercholesterolemia, unspecified: Secondary | ICD-10-CM

## 2024-09-17 DIAGNOSIS — I10 Essential (primary) hypertension: Secondary | ICD-10-CM

## 2024-09-20 ENCOUNTER — Ambulatory Visit: Admitting: Family Medicine

## 2024-09-20 ENCOUNTER — Encounter: Payer: Self-pay | Admitting: Family Medicine

## 2024-09-20 VITALS — BP 122/73 | HR 66 | Ht 68.0 in | Wt 177.0 lb

## 2024-09-20 DIAGNOSIS — K625 Hemorrhage of anus and rectum: Secondary | ICD-10-CM

## 2024-09-20 DIAGNOSIS — A0472 Enterocolitis due to Clostridium difficile, not specified as recurrent: Secondary | ICD-10-CM

## 2024-09-20 NOTE — Patient Instructions (Signed)
 To keep you healthy, please keep in mind the following health maintenance items that you are due for:   Health Maintenance Due  Topic Date Due   DTaP/Tdap/Td (1 - Tdap) Never done   Zoster Vaccines- Shingrix (1 of 2) 10/16/1957   COVID-19 Vaccine (2 - Pfizer risk series) 01/08/2021   Influenza Vaccine  07/16/2024     Best Wishes,   Dr. Lang

## 2024-09-20 NOTE — Progress Notes (Signed)
 Established patient visit   Patient: Travis Salazar.   DOB: 1936/10/18   88 y.o. Male  MRN: [id number] Visit Date: 09/20/2024  Today's healthcare provider: Rockie Agent, MD   Chief Complaint  Patient presents with   Hospitalization Follow-up    Patient admitted to [facility] 09/08/24-09/11/24 due to bright red blood per rectum. Patient reports feeling good and feels issue resolved Appt with [facility] 10/04/24   Subjective     HPI     Hospitalization Follow-up    Additional comments: Patient admitted to [facility] 09/08/24-09/11/24 due to bright red blood per rectum. Patient reports feeling good and feels issue resolved Appt with [facility] 10/04/24      Last edited by Agent Rockie, MD on 09/20/2024  2:29 PM.       Discussed the use of AI scribe software for clinical note transcription with the patient, who gave verbal consent to proceed.  History of Present Illness Travis Salazar. is an 88 year old male who presents for follow-up after hospitalization for bright red blood per rectum.  He was hospitalized from September 24th to September 27th due to bright red blood per rectum. During his stay, he was diagnosed with a C. difficile infection and treated with a 10-day course of Dificid . His hemoglobin remained stable, and there was no concern for a gastrointestinal bleed. Since discharge, he has not experienced further bleeding, and his diarrhea has resolved.  He has a history of hypertension, hyperlipidemia, cerebrovascular accident, paroxysmal supraventricular tachycardia, coronary artery disease, seizures, emphysema, prostate cancer status post prostatectomy, gout, glaucoma, deep vein thrombosis, and pulmonary embolism. He continues to take metoprolol  12.5 mg twice daily for supraventricular tachycardia, atorvastatin  40 mg daily, and Eliquis  5 mg twice daily for his history of deep vein thrombosis/pulmonary embolism and cerebrovascular accident.  Prior to  his recent hospital admission, he was treated for a urinary tract infection with Keflex . No further urinary symptoms, including hematuria, and normal urination are reported.  Overall, he feels well with no further episodes of diarrhea or bleeding. He has been able to eat and drink normally since returning home and completed the 10-day course of antibiotics for the C. difficile infection. No current diarrhea, abdominal pain, or blood in his urine. 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   amLODipine  (NORVASC ) 10 MG tablet Take 1 tablet (10 mg total) by mouth daily.   apixaban  (ELIQUIS ) 5 MG TABS tablet Take 1 tablet (5 mg total) by mouth 2 (two) times daily.   atorvastatin  (LIPITOR) 40 MG tablet TAKE 1 TABLET BY  MOUTH DAILY AT  6 PM   brimonidine  (ALPHAGAN ) 0.2 % ophthalmic solution Place 1 drop into both eyes 2 (two) times daily.   diazePAM , 15 MG Dose, (VALTOCO  15 MG DOSE) 2 x 7.5 MG/0.1ML LQPK Spray in each nostril as needed for seizure. May use second dose after 4 hours if needed.   dorzolamide -timolol  (COSOPT ) 2-0.5 % ophthalmic solution Place 1 drop into both eyes 2 (two) times daily.   lacosamide   (VIMPAT ) 50 MG TABS tablet Take 1 tablet every morning (Patient taking differently: Take 50 mg by mouth daily.)   Lacosamide  100 MG TABS Take 1 tablet every night (Patient taking differently: Take 1 tablet by mouth every evening.)   levETIRAcetam  (KEPPRA ) 1000 MG tablet Take 2 tablets (2,000 mg total) by mouth 2 (two) times daily.   metoprolol  tartrate (LOPRESSOR ) 25 MG tablet TAKE ONE-HALF TABLET BY MOUTH  TWICE DAILY   VYZULTA 0.024 % SOLN Apply 1 drop to eye at bedtime.   No facility-administered medications prior to visit.    Review of Systems  Last CBC Lab Results  Component Value Date   WBC 7.1 09/20/2024   HGB 11.4 (L) 09/20/2024   HCT 36.3 (L) 09/20/2024   MCV 91 09/20/2024   MCH 28.6 09/20/2024   RDW 13.6 09/20/2024   PLT 182 09/20/2024   Last metabolic panel Lab Results  Component Value Date   GLUCOSE 96 09/20/2024   NA 138 09/20/2024   K 4.2 09/20/2024   CL 103 09/20/2024   CO2 22 09/20/2024   BUN 15 09/20/2024   CREATININE 0.95 09/20/2024   EGFR 78 09/20/2024   CALCIUM  9.8 09/20/2024   PHOS 2.9 08/23/2021   PROT 5.5 (L) 09/09/2024   ALBUMIN 2.6 (L) 09/09/2024   LABGLOB 2.5 06/29/2024   AGRATIO 1.8 08/29/2020   BILITOT 0.9 09/09/2024   ALKPHOS 47 09/09/2024   AST 21 09/09/2024   ALT 10 09/09/2024   ANIONGAP 9 09/11/2024   Last lipids Lab Results  Component Value Date   CHOL 150 06/29/2024   HDL 37 (L) 06/29/2024   LDLCALC 87 06/29/2024   LDLDIRECT 89 02/05/2023   TRIG 147 06/29/2024   CHOLHDL 4.1 06/29/2024   Last hemoglobin A1c Lab Results  Component Value Date   HGBA1C 6.0 (H) 06/29/2024   Last thyroid  functions Lab Results  Component Value Date   TSH 4.040 08/29/2020   Last vitamin D No results found for: 25OHVITD2, 25OHVITD3, VD25OH   {See past labs  Heme  Chem  Endocrine  Serology  Results Review (optional):1}   Objective    BP 122/73 (BP Location: Left Arm, Patient Position: Sitting, Cuff Size: Normal)   Pulse 66    Ht 5' 8 (1.727 m)   Wt 177 lb (80.3 kg)   SpO2 100%   BMI 26.91 kg/m  BP Readings from Last 3 Encounters:  09/20/24 122/73  09/11/24 (!) 140/81  08/30/24 (!) 153/77   Wt Readings from Last 3 Encounters:  09/20/24 177 lb (80.3 kg)  06/29/24 177 lb (80.3 kg)  04/20/24 182 lb (82.6 kg)    {See vitals history (optional):1}    Physical Exam Vitals reviewed.  Constitutional:      General: He is not in acute distress.    Appearance: Normal appearance. He is not ill-appearing.  Cardiovascular:     Rate and Rhythm: Normal rate and regular rhythm.     Pulses: Normal pulses.     Heart sounds: Murmur heard.  Pulmonary:  Effort: Pulmonary effort is normal. No respiratory distress.     Breath sounds: No wheezing, rhonchi or rales.  Abdominal:     General: Bowel sounds are normal. There is no distension.     Palpations: Abdomen is soft. There is no mass.     Tenderness: There is no abdominal tenderness.  Neurological:     Mental Status: He is alert and oriented to person, place, and time.  Psychiatric:        Mood and Affect: Mood normal.        Behavior: Behavior normal.       Results for orders placed or performed in visit on 09/20/24  CBC with Differential/Platelet  Result Value Ref Range   WBC 7.1 3.4 - 10.8 x10E3/uL   RBC 3.99 (L) 4.14 - 5.80 x10E6/uL   Hemoglobin 11.4 (L) 13.0 - 17.7 g/dL   Hematocrit 63.6 (L) 62.4 - 51.0 %   MCV 91 79 - 97 fL   MCH 28.6 26.6 - 33.0 pg   MCHC 31.4 (L) 31.5 - 35.7 g/dL   RDW 86.3 88.3 - 84.5 %   Platelets 182 150 - 450 x10E3/uL   Neutrophils 58 Not Estab. %   Lymphs 31 Not Estab. %   Monocytes 8 Not Estab. %   Eos 2 Not Estab. %   Basos 1 Not Estab. %   Neutrophils Absolute 4.2 1.4 - 7.0 x10E3/uL   Lymphocytes Absolute 2.2 0.7 - 3.1 x10E3/uL   Monocytes Absolute 0.6 0.1 - 0.9 x10E3/uL   EOS (ABSOLUTE) 0.1 0.0 - 0.4 x10E3/uL   Basophils Absolute 0.1 0.0 - 0.2 x10E3/uL   Immature Granulocytes 0 Not Estab. %   Immature Grans  (Abs) 0.0 0.0 - 0.1 x10E3/uL  Basic metabolic panel with GFR  Result Value Ref Range   Glucose 96 70 - 99 mg/dL   BUN 15 8 - 27 mg/dL   Creatinine, Ser 9.04 0.76 - 1.27 mg/dL   eGFR 78 >40 fO/fpw/8.26   BUN/Creatinine Ratio 16 10 - 24   Sodium 138 134 - 144 mmol/L   Potassium 4.2 3.5 - 5.2 mmol/L   Chloride 103 96 - 106 mmol/L   CO2 22 20 - 29 mmol/L   Calcium  9.8 8.6 - 10.2 mg/dL  Magnesium  Result Value Ref Range   Magnesium 2.2 1.6 - 2.3 mg/dL    Assessment & Plan     Problem List Items Addressed This Visit   None Visit Diagnoses       Rectal bleeding    -  Primary   Relevant Orders   CBC with Differential/Platelet (Completed)     C. difficile diarrhea       Relevant Orders   Basic metabolic panel with GFR (Completed)     Hypomagnesemia       Relevant Orders   Magnesium (Completed)       Assessment and Plan Assessment & Plan Recent gastrointestinal bleeding and resolved Clostridioides difficile infection Resolved Clostridioides difficile infection with no further episodes of diarrhea or gastrointestinal bleeding. Hemoglobin levels have stabilized around 11, with no current concern for GI bleed. The bleeding was likely due to C. difficile-associated diarrhea. Completed a 10-day course of Dificid  for C. difficile infection. - Order CBC to monitor hemoglobin levels - Order basic metabolic panel to assess kidney function - Order magnesium level to ensure it remains stable - Follow up with GI on October 20th to determine the need for colonoscopy  Chronic anticoagulation for DVT and PE Continues  on Eliquis  5 mg twice daily for chronic anticoagulation due to history of DVT and PE. - Continue Eliquis  5 mg twice daily  Coronary artery disease Chronic Well-managed coronary artery disease with current management. No acute issues reported. -Follow up with cardiology as previously scheduled    Cerebrovascular accident (CVA) CVA with current management including  anticoagulation and blood pressure control.  Hypertension Chronic  Blood pressure is well controlled with current medication regimen. Recent reading was 122/73 mmHg. -  Hyperlipidemia Chronic  Continues atorvastatin  40 mg daily for hyperlipidemia management. - Continue atorvastatin  40 mg daily  Paroxysmal supraventricular tachycardia (SVT) Continues metoprolol  12.5 mg twice daily for management of paroxysmal SVT. - Continue metoprolol  12.5 mg twice daily     Return in about 4 months (around 01/21/2025) for Chronic F/U, AF, .         Rockie Agent, MD  [facility] [phone] (phone) [phone] (fax)  [facility]

## 2024-09-21 LAB — CBC WITH DIFFERENTIAL/PLATELET
Basophils Absolute: 0.1 x10E3/uL (ref 0.0–0.2)
Basos: 1 %
EOS (ABSOLUTE): 0.1 x10E3/uL (ref 0.0–0.4)
Eos: 2 %
Hematocrit: 36.3 % — ABNORMAL LOW (ref 37.5–51.0)
Hemoglobin: 11.4 g/dL — ABNORMAL LOW (ref 13.0–17.7)
Immature Grans (Abs): 0 x10E3/uL (ref 0.0–0.1)
Immature Granulocytes: 0 %
Lymphocytes Absolute: 2.2 x10E3/uL (ref 0.7–3.1)
Lymphs: 31 %
MCH: 28.6 pg (ref 26.6–33.0)
MCHC: 31.4 g/dL — ABNORMAL LOW (ref 31.5–35.7)
MCV: 91 fL (ref 79–97)
Monocytes Absolute: 0.6 x10E3/uL (ref 0.1–0.9)
Monocytes: 8 %
Neutrophils Absolute: 4.2 x10E3/uL (ref 1.4–7.0)
Neutrophils: 58 %
Platelets: 182 x10E3/uL (ref 150–450)
RBC: 3.99 x10E6/uL — ABNORMAL LOW (ref 4.14–5.80)
RDW: 13.6 % (ref 11.6–15.4)
WBC: 7.1 x10E3/uL (ref 3.4–10.8)

## 2024-09-21 LAB — BASIC METABOLIC PANEL WITH GFR
BUN/Creatinine Ratio: 16 (ref 10–24)
BUN: 15 mg/dL (ref 8–27)
CO2: 22 mmol/L (ref 20–29)
Calcium: 9.8 mg/dL (ref 8.6–10.2)
Chloride: 103 mmol/L (ref 96–106)
Creatinine, Ser: 0.95 mg/dL (ref 0.76–1.27)
Glucose: 96 mg/dL (ref 70–99)
Potassium: 4.2 mmol/L (ref 3.5–5.2)
Sodium: 138 mmol/L (ref 134–144)
eGFR: 78 mL/min/1.73 (ref >59)

## 2024-09-21 LAB — MAGNESIUM: Magnesium: 2.2 mg/dL (ref 1.6–2.3)

## 2024-09-23 ENCOUNTER — Inpatient Hospital Stay: Admitting: Family Medicine

## 2024-09-27 DIAGNOSIS — A0472 Enterocolitis due to Clostridium difficile, not specified as recurrent: Secondary | ICD-10-CM | POA: Diagnosis not present

## 2024-09-27 DIAGNOSIS — D72829 Elevated white blood cell count, unspecified: Secondary | ICD-10-CM | POA: Diagnosis not present

## 2024-09-27 DIAGNOSIS — I2510 Atherosclerotic heart disease of native coronary artery without angina pectoris: Secondary | ICD-10-CM | POA: Diagnosis not present

## 2024-09-27 DIAGNOSIS — E785 Hyperlipidemia, unspecified: Secondary | ICD-10-CM | POA: Diagnosis not present

## 2024-09-27 DIAGNOSIS — M1A09X0 Idiopathic chronic gout, multiple sites, without tophus (tophi): Secondary | ICD-10-CM | POA: Diagnosis not present

## 2024-09-27 DIAGNOSIS — K625 Hemorrhage of anus and rectum: Secondary | ICD-10-CM | POA: Diagnosis not present

## 2024-09-27 DIAGNOSIS — I471 Supraventricular tachycardia, unspecified: Secondary | ICD-10-CM | POA: Diagnosis not present

## 2024-09-27 DIAGNOSIS — I10 Essential (primary) hypertension: Secondary | ICD-10-CM | POA: Diagnosis not present

## 2024-09-27 DIAGNOSIS — G40909 Epilepsy, unspecified, not intractable, without status epilepticus: Secondary | ICD-10-CM

## 2024-09-27 DIAGNOSIS — E860 Dehydration: Secondary | ICD-10-CM | POA: Diagnosis not present

## 2024-09-27 DIAGNOSIS — E876 Hypokalemia: Secondary | ICD-10-CM | POA: Diagnosis not present

## 2024-09-27 DIAGNOSIS — N390 Urinary tract infection, site not specified: Secondary | ICD-10-CM | POA: Diagnosis not present

## 2024-09-30 DIAGNOSIS — R3914 Feeling of incomplete bladder emptying: Secondary | ICD-10-CM | POA: Diagnosis not present

## 2024-09-30 DIAGNOSIS — R8271 Bacteriuria: Secondary | ICD-10-CM | POA: Diagnosis not present

## 2024-10-01 ENCOUNTER — Telehealth: Payer: Self-pay | Admitting: Family Medicine

## 2024-10-01 NOTE — Telephone Encounter (Signed)
 Called to let patient's daughter know that we received the forms, advised that dr.Robinson is out until Monday and will work on completing them upon her return. We will fax these for patient and make her a copy to pick up once complete.

## 2024-10-01 NOTE — Telephone Encounter (Signed)
 Copied from CRM [id number]. Topic: General - Call Back - No Documentation >> Oct 01, 2024  1:46 PM Avram MATSU wrote: Reason for CRM: [name] is calling got her dad and would like to know if the office got the [employer] forms that was faxed on 10/14  Please advise  [phone]

## 2024-10-04 ENCOUNTER — Encounter: Payer: Self-pay | Admitting: Gastroenterology

## 2024-10-04 ENCOUNTER — Other Ambulatory Visit: Payer: Self-pay | Admitting: Neurology

## 2024-10-04 ENCOUNTER — Ambulatory Visit (INDEPENDENT_AMBULATORY_CARE_PROVIDER_SITE_OTHER): Admitting: Gastroenterology

## 2024-10-04 VITALS — BP 122/78 | HR 68 | Ht 68.0 in | Wt 169.0 lb

## 2024-10-04 DIAGNOSIS — H409 Unspecified glaucoma: Secondary | ICD-10-CM

## 2024-10-04 DIAGNOSIS — I82422 Acute embolism and thrombosis of left iliac vein: Secondary | ICD-10-CM

## 2024-10-04 DIAGNOSIS — G8194 Hemiplegia, unspecified affecting left nondominant side: Secondary | ICD-10-CM

## 2024-10-04 DIAGNOSIS — Z8673 Personal history of transient ischemic attack (TIA), and cerebral infarction without residual deficits: Secondary | ICD-10-CM

## 2024-10-04 DIAGNOSIS — A0472 Enterocolitis due to Clostridium difficile, not specified as recurrent: Secondary | ICD-10-CM

## 2024-10-04 DIAGNOSIS — K8689 Other specified diseases of pancreas: Secondary | ICD-10-CM | POA: Diagnosis not present

## 2024-10-04 DIAGNOSIS — I2510 Atherosclerotic heart disease of native coronary artery without angina pectoris: Secondary | ICD-10-CM

## 2024-10-04 DIAGNOSIS — G40909 Epilepsy, unspecified, not intractable, without status epilepticus: Secondary | ICD-10-CM

## 2024-10-04 DIAGNOSIS — G40201 Localization-related (focal) (partial) symptomatic epilepsy and epileptic syndromes with complex partial seizures, not intractable, with status epilepticus: Secondary | ICD-10-CM

## 2024-10-04 NOTE — Progress Notes (Signed)
 Chief Complaint: C. difficile colitis Hospital follow-up Primary GI MD: Dr. Albertus  HPI:    88 year old male with a history of hypertension, previous stroke, DVT and PE on Eliquis , prostate cancer without radiation, CAD, COPD with known right upper lobe nodule presents for hospital follow up  Patient presented to ED 09/09/2024 with diarrhea 3 to 4 days with 2-3 diarrheal stools with blood mixed in.  CT abdomen pelvis 08/24/2024 with contrast shows small sclerotic bone mets in the L3-L4 vertebral bodies and right ilium could represent therapeutic response of previous occult small bone mets although new bone mets cannot definitely be excluded, stable L5 vertebral body metastases, no abdominal lymphadenopathy or other soft tissue metastatic disease stable 1.3 cm cystic lesion in the pancreatic tail likely representing an indolent cystic neoplasm/sidebranch IPMN.   He was seen at [facility] for UTI and treated with Keflex  08/24/2024  Patient tested positive for C. difficile and was treated with Dificid  and discharged.  Recent CBC showed resolution of leukocytosis  Discussed the use of AI scribe software for clinical note transcription with the patient, who gave verbal consent to proceed.  History of Present Illness  He was recently hospitalized for a urinary tract infection and treated with Keflex . Subsequently, he developed bloody diarrhea and was diagnosed with a C. difficile infection, likely due to the antibiotic or hospital stay. He completed treatment with Dificid , and his stools have returned to a more solid consistency. No current abdominal pain, nausea, or vomiting. He had a bowel movement this morning, confirming solid stools.  A CT scan showed a stable pancreatic lesion. Recent labs indicate improved anemia with a hemoglobin level of 11.4, and his kidney function is reported as great.  His last colonoscopy in 2012 at [facility] showed fair preparation, some hemorrhoids, but  otherwise normal findings. He has not had any colonoscopies since then.    PREVIOUS GI WORKUP   Colonoscopy in 2012 with fair prep, otherwise normal.  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)    History of prostate cancer    History of pulmonary embolus (PE)    Hyperlipidemia    Hypertension    Laceration of left eyebrow 10/06/2021   Mass of upper lobe of right lung    Mild mitral stenosis by prior echocardiogram    Moderate to severe aortic stenosis    Primary open-angle glaucoma, bilateral, severe stage    Seizures (HCC)    Stroke [facility])     Past Surgical History:  Procedure Laterality Date   CATARACT EXTRACTION W/PHACO Right 07/16/2023   Procedure: CATARACT EXTRACTION PHACO AND INTRAOCULAR LENS PLACEMENT (IOC) RIGHT MALYUGIN, OMIDRIA , VISION BLUE, KAHOOK DUAL BLADE GONIOTOMY 8.19 1.03.2;  Surgeon: Enola Feliciano Hugger, MD;  Location: [facility];  Service: Ophthalmology;  Laterality: Right;   CATARACT EXTRACTION W/PHACO Left 07/31/2023   Procedure: CATARACT EXTRACTION PHACO AND INTRAOCULAR LENS PLACEMENT (IOC) LEFT OMIDRIA  VISION BLUE KAHOOK DUAL BLADE GONIOTOMY  7.40  01:00.8;  Surgeon: Enola Feliciano Hugger, MD;  Location: [facility];  Service: Ophthalmology;  Laterality: Left;   LIPOMA EXCISION     neck   PROSTATECTOMY     due to prostate cancer    Current Outpatient Medications  Medication Sig Dispense  Refill   acetaminophen  (TYLENOL ) 500 MG tablet Take 500 mg by mouth daily as needed (back pain).     amLODipine  (NORVASC ) 10 MG tablet Take 1 tablet (10 mg total) by mouth daily. 90 tablet 3   apixaban  (ELIQUIS ) 5 MG TABS tablet Take 1 tablet (5 mg total) by mouth 2 (two) times daily. 60 tablet 3    atorvastatin  (LIPITOR) 40 MG tablet TAKE 1 TABLET BY MOUTH DAILY AT  6 PM 90 tablet 3   brimonidine  (ALPHAGAN ) 0.2 % ophthalmic solution Place 1 drop into both eyes 2 (two) times daily.     diazePAM , 15 MG Dose, (VALTOCO  15 MG DOSE) 2 x 7.5 MG/0.1ML LQPK Spray in each nostril as needed for seizure. May use second dose after 4 hours if needed. 5 each 5   dorzolamide -timolol  (COSOPT ) 2-0.5 % ophthalmic solution Place 1 drop into both eyes 2 (two) times daily.     lacosamide  (VIMPAT ) 50 MG TABS tablet Take 1 tablet every morning (Patient taking differently: Take 50 mg by mouth daily.) 90 tablet 3   Lacosamide  100 MG TABS Take 1 tablet every night (Patient taking differently: Take 1 tablet by mouth every evening.) 90 tablet 3   levETIRAcetam  (KEPPRA ) 1000 MG tablet Take 2 tablets (2,000 mg total) by mouth 2 (two) times daily. 360 tablet 3   metoprolol  tartrate (LOPRESSOR ) 25 MG tablet TAKE ONE-HALF TABLET BY MOUTH  TWICE DAILY 270 tablet 1   VYZULTA 0.024 % SOLN Apply 1 drop to eye at bedtime.     No current facility-administered medications for this visit.    Allergies as of 10/04/2024   (No Known Allergies)    Family History  Problem Relation Age of Onset   COPD Mother    Heart attack Father     Social History   Socioeconomic History   Marital status: Widowed    Spouse name: [name]   Number of children: 4   Years of education: 10th grade   Highest education level: 9th grade  Occupational History   Occupation: retired  Tobacco Use   Smoking status: Former    Current packs/day: 0.00    Average packs/day: 1 pack/day for 50.0 years (50.0 ttl pk-yrs)    Types: Cigarettes    Start date: 12/16/1959    Quit date: 12/15/2009    Years since quitting: 14.8   Smokeless tobacco: Never   Tobacco comments:    already quit smoking  Vaping Use   Vaping status: Never Used  Substance and Sexual Activity   Alcohol use: No   Drug use: Never   Sexual activity: Never  Other Topics Concern    Not on file  Social History Narrative   ** Merged History Encounter ** Right handed    Caffeine 2 cups daily   Home is one story   Social Drivers of Health   Financial Resource Strain: Low Risk  (06/29/2024)   Overall Financial Resource Strain (CARDIA)    Difficulty of Paying Living Expenses: Not hard at all  Food Insecurity: No Food Insecurity (09/09/2024)   Hunger Vital Sign    Worried About Running Out of Food in the Last Year: Never true    Ran Out of Food in the Last Year: Never true  Transportation Needs: No Transportation Needs (09/09/2024)   PRAPARE - Administrator, Civil Service (Medical): No    Lack of Transportation (Non-Medical): No  Physical Activity: Inactive (06/29/2024)   Exercise Vital Sign    Days  of Exercise per Week: 0 days    Minutes of Exercise per Session: Not on file  Stress: No Stress Concern Present (06/29/2024)   [employer] of Occupational Health - Occupational Stress Questionnaire    Feeling of Stress: Not at all  Social Connections: Socially Isolated (09/09/2024)   Social Connection and Isolation Panel    Frequency of Communication with Friends and Family: More than three times a week    Frequency of Social Gatherings with Friends and Family: More than three times a week    Attends Religious Services: Never    Database administrator or Organizations: No    Attends Banker Meetings: Never    Marital Status: Widowed  Intimate Partner Violence: Not At Risk (09/09/2024)   Humiliation, Afraid, Rape, and Kick questionnaire    Fear of Current or Ex-Partner: No    Emotionally Abused: No    Physically Abused: No    Sexually Abused: No    Review of Systems:    Constitutional: No weight loss, fever, chills, weakness or fatigue HEENT: Eyes: No change in vision               Ears, Nose, Throat:  No change in hearing or congestion Skin: No rash or itching Cardiovascular: No chest pain, chest pressure or palpitations    Respiratory: No SOB or cough Gastrointestinal: See HPI and otherwise negative Genitourinary: No dysuria or change in urinary frequency Neurological: No headache, dizziness or syncope Musculoskeletal: No new muscle or joint pain Hematologic: No bleeding or bruising Psychiatric: No history of depression or anxiety    Physical Exam:  Vital signs: BP 122/78   Pulse 68   Ht 5' 8 (1.727 m)   Wt 169 lb (76.7 kg)   BMI 25.70 kg/m   Constitutional: NAD, alert and cooperative. Walks with cane. Impaired vision. Daughter walked patient to and from room Head:  Normocephalic and atraumatic. Eyes:   PEERL, EOMI. No icterus. Conjunctiva pink. Respiratory: Respirations even and unlabored. Lungs clear to auscultation bilaterally.   No wheezes, crackles, or rhonchi.  Cardiovascular:  Regular rate and rhythm. No peripheral edema, cyanosis or pallor.  Gastrointestinal:  Soft, nondistended, nontender. No rebound or guarding. Normal bowel sounds. No appreciable masses or hepatomegaly. Rectal:  Declines Msk:  Symmetrical without gross deformities. Without edema, no deformity or joint abnormality.  Neurologic:  Alert and  oriented x4;  grossly normal neurologically.  Skin:   Dry and intact without significant lesions or rashes. Psychiatric: Oriented to person, place and time. Demonstrates good judgement and reason without abnormal affect or behaviors.  RELEVANT LABS AND IMAGING: CBC    Component Value Date/Time   WBC 7.1 09/20/2024 1445   WBC 5.6 09/11/2024 0315   RBC 3.99 (L) 09/20/2024 1445   RBC 3.35 (L) 09/11/2024 0315   HGB 11.4 (L) 09/20/2024 1445   HCT 36.3 (L) 09/20/2024 1445   PLT 182 09/20/2024 1445   MCV 91 09/20/2024 1445   MCH 28.6 09/20/2024 1445   MCH 29.3 09/11/2024 0315   MCHC 31.4 (L) 09/20/2024 1445   MCHC 32.2 09/11/2024 0315   RDW 13.6 09/20/2024 1445   LYMPHSABS 2.2 09/20/2024 1445   MONOABS 0.4 08/30/2024 1605   EOSABS 0.1 09/20/2024 1445   BASOSABS 0.1 09/20/2024  1445    CMP     Component Value Date/Time   NA 138 09/20/2024 1445   K 4.2 09/20/2024 1445   CL 103 09/20/2024 1445   CO2 22 09/20/2024 1445  GLUCOSE 96 09/20/2024 1445   GLUCOSE 94 09/11/2024 0315   BUN 15 09/20/2024 1445   CREATININE 0.95 09/20/2024 1445   CREATININE 0.81 01/07/2020 0919   CREATININE 0.95 10/06/2017 1208   CALCIUM  9.8 09/20/2024 1445   PROT 5.5 (L) 09/09/2024 0436   PROT 6.5 06/29/2024 1620   ALBUMIN 2.6 (L) 09/09/2024 0436   ALBUMIN 4.0 06/29/2024 1620   AST 21 09/09/2024 0436   AST 19 01/07/2020 0919   ALT 10 09/09/2024 0436   ALT 12 01/07/2020 0919   ALKPHOS 47 09/09/2024 0436   BILITOT 0.9 09/09/2024 0436   BILITOT 0.4 06/29/2024 1620   BILITOT 0.5 01/07/2020 0919   GFRNONAA >60 09/11/2024 0315   GFRNONAA >60 01/07/2020 0919   GFRNONAA 76 10/06/2017 1208   GFRAA 86 08/29/2020 1135   GFRAA >60 01/07/2020 0919   GFRAA 89 10/06/2017 1208     Assessment/Plan:   88 year old male history of hypertension, stroke, DVT and PE on Eliquis , prostate cancer without radiation, CAD, COPD with right upper lobe nodule presents for hospital follow-up  C diff colitis Last colonoscopy 2012 without polyps or diverticulosis.  Keflex  treatment 08/24/2024 for UTI.  Positive C. difficile 09/10/2024 treated with Dificid .  Recent CBC with resolution of leukocytosis. Resolutions of symptoms With resolution of symptoms, extensive comorbidities, advanced age, and impaired eye sight, decreased mobility.SABRA a colonoscopy at this time would be very difficult for patient with prep and would be high risk in this individual who reports resolution of symptoms. Extensive discussion with patient and daughter lead to joint decision making to hold off on colonoscopy for now and if recurrence of symptoms we can decide if a colonoscopy is warranted in the future and at that time he would likely need to be admitted for prep and would need cardiac clearance. -- hold off on colonoscopy -- patient  to call back if recurrence of symptoms and at that time we can complete stool studies outpatient -- educated on C diff colitis -- follow up as needed  Pancreatic lesion CT 08/24/2024 with stable cystic lesion of pancreatic tail, probable IPMN -- can repeat imaging in one year  DVT/PE History of CVA with left hemiparesis On statin and Eliquis   Paroxysmal SVT  Prostate cancer s/p prostatectomy  Seizure disorder  Glaucoma  Nestor Mollie RIGGERS [facility] 10/04/2024, 3:09 PM  Cc: Sharma Bullocks*

## 2024-10-06 NOTE — Telephone Encounter (Signed)
 Patient's daughter calling for update on FMLA paperwork. Daughter states the deadline is 10/13/24.  Requesting callback: [phone]

## 2024-10-07 NOTE — Telephone Encounter (Signed)
 FMLA forms have been completed  Please copy and contact pt/family to pick up from front desk in [address] Form placed in front desk form pick up folder corresponding to patient's last name   Rockie Agent, MD  10/07/24

## 2024-10-07 NOTE — Telephone Encounter (Signed)
 Faxed and original left at front desk for pick up.  Copy sent to the scan center.

## 2024-10-07 NOTE — Telephone Encounter (Signed)
 Daughter notified, can you please make a copy and leave up front for her? She also prefers for these to be faxed- let me know if there is not a number on there. THX!

## 2024-10-09 ENCOUNTER — Ambulatory Visit: Payer: Self-pay | Admitting: Family Medicine

## 2024-10-12 ENCOUNTER — Telehealth: Payer: Self-pay | Admitting: Neurology

## 2024-10-12 NOTE — Telephone Encounter (Signed)
 [facility] [address] in [place]  Pt daughter wants to know if Georjean can send a 90 day supply of his lacosamide  50mg  and 100mg . Would like a call back to let her know

## 2024-10-12 NOTE — Telephone Encounter (Signed)
 Pt daughter called informed that we have sent in RX for 90 day supply pt daughter said that she will be calling the pharmacy back,

## 2024-10-28 ENCOUNTER — Other Ambulatory Visit: Payer: Self-pay | Admitting: Family Medicine

## 2024-10-28 DIAGNOSIS — E7800 Pure hypercholesterolemia, unspecified: Secondary | ICD-10-CM

## 2024-10-28 DIAGNOSIS — I2699 Other pulmonary embolism without acute cor pulmonale: Secondary | ICD-10-CM

## 2024-10-28 DIAGNOSIS — I10 Essential (primary) hypertension: Secondary | ICD-10-CM

## 2024-11-26 ENCOUNTER — Ambulatory Visit: Admitting: Neurology

## 2024-11-26 ENCOUNTER — Encounter: Payer: Self-pay | Admitting: Neurology

## 2024-11-26 VITALS — BP 119/80 | HR 74 | Ht 68.0 in | Wt 162.0 lb

## 2024-11-26 DIAGNOSIS — G40201 Localization-related (focal) (partial) symptomatic epilepsy and epileptic syndromes with complex partial seizures, not intractable, with status epilepticus: Secondary | ICD-10-CM

## 2024-11-26 DIAGNOSIS — Z8673 Personal history of transient ischemic attack (TIA), and cerebral infarction without residual deficits: Secondary | ICD-10-CM | POA: Diagnosis not present

## 2024-11-26 MED ORDER — LACOSAMIDE 100 MG PO TABS: ORAL_TABLET | ORAL | 3 refills | Status: AC

## 2024-11-26 MED ORDER — LEVETIRACETAM 1000 MG PO TABS: 2000.0000 mg | ORAL_TABLET | Freq: Two times a day (BID) | ORAL | 3 refills | Status: AC

## 2024-11-26 MED ORDER — LACOSAMIDE 50 MG PO TABS: ORAL_TABLET | ORAL | 3 refills | Status: AC

## 2024-11-26 NOTE — Patient Instructions (Signed)
 It's always a pleasure to see you. Continue all your medications.  Schedule eye doctor appointment. Follow-up in 6 months, call for any changes.   Seizure Precautions: 1. If medication has been prescribed for you to prevent seizures, take it exactly as directed.  Do not stop taking the medicine without talking to your doctor first, even if you have not had a seizure in a long time.   2. Avoid activities in which a seizure would cause danger to yourself or to others.  Don't operate dangerous machinery, swim alone, or climb in high or dangerous places, such as on ladders, roofs, or girders.  Do not drive unless your doctor says you may.  3. If you have any warning that you may have a seizure, lay down in a safe place where you can't hurt yourself.    4.  No driving for 6 months from last seizure, as per [place]  state law.   Please refer to the following link on the [employer]ile[employer] of [employer]'s website for more information: http://www.epilepsyfoundation.org/answerplace/Social/driving/drivingu.cfm   5.  Maintain good sleep hygiene.  6.  Contact your doctor if you have any problems that may be related to the medicine you are taking.  7.  Call 911 and bring the patient back to the ED if:        A.  The seizure lasts longer than 5 minutes.       B.  The patient doesn't awaken shortly after the seizure  C.  The patient has new problems such as difficulty seeing, speaking or moving  D.  The patient was injured during the seizure  E.  The patient has a temperature over 102 F (39C)  F.  The patient vomited and now is having trouble breathing

## 2024-11-26 NOTE — Progress Notes (Signed)
 NEUROLOGY FOLLOW UP OFFICE NOTE  Travis Salazar [id number] [birth year]  Discussed the use of AI scribe software for clinical note transcription with the patient, who gave verbal consent to proceed.  History of Present Illness I had the pleasure of seeing Travis Salazar in follow-up in the neurology clinic on 11/26/2024.  The patient was last seen 7 months ago for seizures. He is again accompanied by his daughter Iva who helps supplement the history today.  Records and images were personally reviewed where available. Since his last visit, his other daughter [name] called our office to report a seizure in May where she used the prn Valtoco . He was hot and sweaty, a little shaky before the seizure, which is typical for him. Lacosamide  increased to 50mg  in AM, 100mg  in PM. He is also on Levetiracetam  2000mg  BID (1000mg  2 tabs BID). He denies any side effects on medications. [name] reported more falls, using his cane more. Iva feels he cannot see anymore. He does not focus today, he is able to see hand movements out of the right eye and light perception on the left eye. They have to hold on to him when walking, he cannot find his food on the plate. He denies any headaches, dizziness, focal numbness/tingling/weakness. Sleep and mood are good.   History on Initial Assessment 09/20/2021: This is an 88 year old right-handed man with a history of hypertension, hyperlipidemia, PE on Eliquis , prostate cancer, prior R MCA stroke, presenting to establish care for new onset seizures. Records from his prior neurologist Dr. Rosemarie and hospital records were reviewed. His last visit with [facility] was in 11/2019. He was admitted to [facility] on 08/13/21, He recalls feeling dizzy, something just was not right, he could talk and told his wife to call EMS and words were slurred. EMS notes indicated left-sided weakness with note of left facial and arm twitching on EMS arrival.  Head CT no acute changes, he was started on Dilantin  and  given repeated doses of Ativan  due to continued focal seizure activity. As seizure activity increased, he was unable to move left arm at all, able to lift right arm without drift. EEG showed LPDs on the right hemisphere, maximal right temporal region with some evolution in frequency but no definite seizure. Background showed diffuse slowing and lateralized right hemisphere slowing. MRI brain no acute changes, right MCA encephalomalacia with chronic blood products. Follow-up EEG showed right temporal sharp waves. He had an MRI brain on 08/22/21 for vision changes, MRI brain no change from prior. He had an extensive DVT and known right lower lobe lung mass which he declined further workup on.   He lives with his wife, his daughter has been staying with them since hospitalization, managing medications. She denies any further seizures since 08/13/21, no staring/unresponsive episodes. He feels his left leg is 90% better. He has glaucoma with blurred vision and bilateral loss of peripheral vision. He denies any olfactory/gustatory hallucinations, deja vu, rising epigastric sensation, focal tingling/weakness, myoclonic jerks. He denies any headaches, dizziness, neck/back pain, bowel/bladder dysfunction. He has been using a cane since hospital discharge, no falls. Sleep is good. No side effects on Levetiracetam  1000mg  BID. Mood is good, however his daughter reports he is moody.   Epilepsy Risk Factors:  right MCA encephalomalacia. Otherwise he had a normal birth and early development.  There is no history of febrile convulsions, CNS infections such as meningitis/encephalitis, significant traumatic brain injury, neurosurgical procedures, or family history of seizures.  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)    History of prostate cancer    History of pulmonary embolus (PE)    Hyperlipidemia    Hypertension    Laceration of left eyebrow 10/06/2021   Mass of upper lobe of right lung    Mild mitral stenosis by prior echocardiogram    Moderate to severe aortic stenosis    Primary open-angle glaucoma, bilateral, severe stage    Seizures (HCC)    Stroke (HCC)     MEDICATIONS: Medications Ordered Prior to Encounter[1]  ALLERGIES: Allergies[2]  FAMILY HISTORY: Family History  Problem Relation Age of Onset   COPD Mother    Heart attack Father     SOCIAL HISTORY: Social History   Socioeconomic History   Marital status: Widowed    Spouse name: [name]   Number of children: 4   Years of education: 10th grade   Highest education level: 9th grade  Occupational History   Occupation: retired  Tobacco Use   Smoking status: Former    Current packs/day: 0.00    Average packs/day: 1 pack/day for 50.0 years (50.0 ttl pk-yrs)    Types: Cigarettes    Start date: 12/16/1959    Quit date: 12/15/2009    Years since quitting: 14.9   Smokeless tobacco: Never   Tobacco comments:    already quit smoking  Vaping Use   Vaping status: Never Used  Substance and Sexual Activity   Alcohol use: No   Drug use: Never   Sexual activity: Never  Other Topics Concern   Not on file  Social History Narrative   ** Merged History Encounter ** Right handed    Caffeine 2 cups daily   Home is one story   Social Drivers of Health   Tobacco Use: Medium Risk (11/26/2024)   Patient History    Smoking Tobacco Use: Former    Smokeless Tobacco Use: Never    Passive Exposure: Not on Actuary Strain: Low Risk (06/29/2024)   Overall Financial Resource Strain (CARDIA)    Difficulty of Paying Living Expenses: Not hard at all  Food Insecurity: No Food Insecurity (09/09/2024)   Epic    Worried About Radiation Protection Practitioner of Food in the Last  Year: Never true    Ran Out of Food in the Last Year: Never true  Transportation Needs: No Transportation Needs (09/09/2024)   Epic    Lack of Transportation (Medical): No    Lack of Transportation (Non-Medical): No  Physical Activity: Inactive (06/29/2024)   Exercise Vital Sign    Days of Exercise per Week: 0 days    Minutes of Exercise per Session: Not on file  Stress: No Stress Concern Present (06/29/2024)   [employer] of Occupational Health - Occupational Stress Questionnaire    Feeling of Stress: Not at all  Social Connections: Socially Isolated (09/09/2024)   Social Connection and Isolation Panel    Frequency of Communication with Friends and Family: More than three times a week    Frequency of Social Gatherings with Friends and Family: More than three times a week    Attends Religious Services: Never    Database Administrator or Organizations: No    Attends Banker  Meetings: Never    Marital Status: Widowed  Intimate Partner Violence: Not At Risk (09/09/2024)   Epic    Fear of Current or Ex-Partner: No    Emotionally Abused: No    Physically Abused: No    Sexually Abused: No  Depression (PHQ2-9): Low Risk (04/19/2024)   Depression (PHQ2-9)    PHQ-2 Score: 0  Alcohol Screen: Low Risk (04/16/2024)   Alcohol Screen    Last Alcohol Screening Score (AUDIT): 0  Housing: Low Risk (09/09/2024)   Epic    Unable to Pay for Housing in the Last Year: No    Number of Times Moved in the Last Year: 0    Homeless in the Last Year: No  Utilities: Not At Risk (09/09/2024)   Epic    Threatened with loss of utilities: No  Health Literacy: Adequate Health Literacy (04/19/2024)   B1300 Health Literacy    Frequency of need for help with medical instructions: Never     PHYSICAL EXAM: Vitals:   11/26/24 1425  BP: 119/80  Pulse: 74  SpO2: 96%   General: No acute distress, sitting on wheelchair Head:  Normocephalic/atraumatic Skin/Extremities: No rash, no  edema Neurological Exam: alert and awake. No aphasia or dysarthria. Fund of knowledge is appropriate.  Attention and concentration are normal.   Cranial nerves: Pupils equal, round. Extraocular movements intact. HM on right eye, LP on left eye. No facial asymmetry.  Motor: Bulk and tone normal, muscle strength 5/5 throughout with no pronator drift.   Finger to nose testing intact.  Gait not tested.    IMPRESSION: This is a pleasant 88 yo RH man with a history of hypertension, hyperlipidemia, PE on Eliquis , prostate cancer, prior R MCA stroke, who had new onset focal status epilepticus last 08/13/21 with left face/arm twitching.  EEG showed LPDs arising from the right temporal region/right hemisphere. MRI no acute changes, right MCA encephalomalacia. Continue Eliquis  and control of vascular risk factors for secondary stroke prevention. He has had one seizure in the past 7 months, now on Lacosamide  50mg  in AM, 100mg  in PM and Levetiracetam  2000mg  BID, refills sent. He has prn Valtoco  for seizure rescue. We again noted vision loss, his daughter feels it is worse, follow-up with eye doctor. Follow-up in 6 months, call for any changes.   Thank you for allowing me to participate in his care.  Please do not hesitate to call for any questions or concerns.   Darice Shivers, M.D.   CC: Dr. Sharma       [1]  Current Outpatient Medications on File Prior to Visit  Medication Sig Dispense Refill   acetaminophen  (TYLENOL ) 500 MG tablet Take 500 mg by mouth daily as needed (back pain).     amLODipine  (NORVASC ) 10 MG tablet Take 1 tablet (10 mg total) by mouth daily. 90 tablet 3   apixaban  (ELIQUIS ) 5 MG TABS tablet Take 1 tablet (5 mg total) by mouth 2 (two) times daily. 60 tablet 3   atorvastatin  (LIPITOR) 40 MG tablet TAKE 1 TABLET BY MOUTH DAILY AT  6 PM 90 tablet 3   brimonidine  (ALPHAGAN ) 0.2 % ophthalmic solution Place 1 drop into both eyes 2 (two) times daily.     diazePAM , 15 MG Dose,  (VALTOCO  15 MG DOSE) 2 x 7.5 MG/0.1ML LQPK Spray in each nostril as needed for seizure. May use second dose after 4 hours if needed. 5 each 5   dorzolamide -timolol  (COSOPT ) 2-0.5 % ophthalmic solution Place 1 drop into both eyes  2 (two) times daily.     lacosamide  (VIMPAT ) 50 MG TABS tablet Take 1 tablet every morning 90 tablet 3   levETIRAcetam  (KEPPRA ) 1000 MG tablet Take 2 tablets (2,000 mg total) by mouth 2 (two) times daily. 360 tablet 3   metoprolol  tartrate (LOPRESSOR ) 25 MG tablet TAKE ONE-HALF TABLET BY MOUTH  TWICE DAILY 270 tablet 1   VYZULTA 0.024 % SOLN Apply 1 drop to eye at bedtime.     Lacosamide  100 MG TABS Take 1 tablet every night (Patient taking differently: Take 1 tablet by mouth every evening.) 90 tablet 3   No current facility-administered medications on file prior to visit.  [2] No Known Allergies

## 2024-11-30 ENCOUNTER — Telehealth: Payer: Self-pay

## 2024-11-30 NOTE — Telephone Encounter (Signed)
 I called this patient's daughter Warren today and LMTRC to schedule the patient for a follow-up appointment with Dr. Wendel. This patient has canceled several appointments, so I also plan to discuss whether or not the patient still wants TAVR.

## 2024-12-03 ENCOUNTER — Other Ambulatory Visit: Payer: Self-pay | Admitting: Family Medicine

## 2024-12-03 DIAGNOSIS — E7800 Pure hypercholesterolemia, unspecified: Secondary | ICD-10-CM

## 2024-12-24 ENCOUNTER — Other Ambulatory Visit: Payer: Self-pay | Admitting: Family Medicine

## 2024-12-24 DIAGNOSIS — I2699 Other pulmonary embolism without acute cor pulmonale: Secondary | ICD-10-CM

## 2024-12-24 DIAGNOSIS — I10 Essential (primary) hypertension: Secondary | ICD-10-CM

## 2025-01-03 ENCOUNTER — Ambulatory Visit: Admitting: Family Medicine

## 2025-01-08 ENCOUNTER — Other Ambulatory Visit: Payer: Self-pay | Admitting: Family Medicine

## 2025-01-08 DIAGNOSIS — I10 Essential (primary) hypertension: Secondary | ICD-10-CM

## 2025-01-10 NOTE — Telephone Encounter (Signed)
 Requested Prescriptions  Pending Prescriptions Disp Refills   amLODipine  (NORVASC ) 10 MG tablet [Pharmacy Med Name: amLODIPine  Besylate 10 MG Oral Tablet] 90 tablet 0    Sig: TAKE 1 TABLET BY MOUTH DAILY     Cardiovascular: Calcium  Channel Blockers 2 Passed - 01/10/2025 12:27 PM      Passed - Last BP in normal range    BP Readings from Last 1 Encounters:  11/26/24 119/80         Passed - Last Heart Rate in normal range    Pulse Readings from Last 1 Encounters:  11/26/24 74         Passed - Valid encounter within last 6 months    Recent Outpatient Visits           3 months ago Rectal bleeding   [place] [facility] Sharma Coyer, MD   6 months ago Annual physical exam   [place] [facility] [place], Coyer, MD

## 2025-01-21 ENCOUNTER — Ambulatory Visit: Admitting: Family Medicine

## 2025-04-14 ENCOUNTER — Ambulatory Visit: Admitting: Family Medicine

## 2025-04-26 ENCOUNTER — Ambulatory Visit

## 2025-06-14 ENCOUNTER — Ambulatory Visit: Payer: Self-pay | Admitting: Neurology
